# Patient Record
Sex: Female | Born: 1935
Health system: Southern US, Community
[De-identification: ages and names within clinical notes are randomized; demographics above are authoritative.]

## PROBLEM LIST (undated history)

## (undated) DIAGNOSIS — K222 Esophageal obstruction: Secondary | ICD-10-CM

## (undated) DIAGNOSIS — K649 Unspecified hemorrhoids: Secondary | ICD-10-CM

## (undated) DIAGNOSIS — R55 Syncope and collapse: Secondary | ICD-10-CM

## (undated) DIAGNOSIS — I10 Essential (primary) hypertension: Secondary | ICD-10-CM

## (undated) DIAGNOSIS — K449 Diaphragmatic hernia without obstruction or gangrene: Secondary | ICD-10-CM

## (undated) DIAGNOSIS — M25519 Pain in unspecified shoulder: Secondary | ICD-10-CM

## (undated) DIAGNOSIS — J309 Allergic rhinitis, unspecified: Secondary | ICD-10-CM

## (undated) DIAGNOSIS — R42 Dizziness and giddiness: Secondary | ICD-10-CM

## (undated) DIAGNOSIS — K8020 Calculus of gallbladder without cholecystitis without obstruction: Secondary | ICD-10-CM

## (undated) DIAGNOSIS — K219 Gastro-esophageal reflux disease without esophagitis: Secondary | ICD-10-CM

## (undated) DIAGNOSIS — G8929 Other chronic pain: Secondary | ICD-10-CM

## (undated) DIAGNOSIS — M1990 Unspecified osteoarthritis, unspecified site: Secondary | ICD-10-CM

## (undated) DIAGNOSIS — Z9289 Personal history of other medical treatment: Secondary | ICD-10-CM

## (undated) DIAGNOSIS — H9190 Unspecified hearing loss, unspecified ear: Secondary | ICD-10-CM

## (undated) HISTORY — DX: Calculus of gallbladder without cholecystitis without obstruction: K80.20

## (undated) HISTORY — DX: Syncope and collapse: R55

## (undated) HISTORY — DX: Gastro-esophageal reflux disease without esophagitis: K21.9

## (undated) HISTORY — DX: Diaphragmatic hernia without obstruction or gangrene: K44.9

## (undated) HISTORY — PX: KNEE ARTHROSCOPY: SUR90

## (undated) HISTORY — DX: Allergic rhinitis, unspecified: J30.9

## (undated) HISTORY — PX: HEMORROIDECTOMY: SUR656

## (undated) HISTORY — DX: Unspecified hemorrhoids: K64.9

## (undated) HISTORY — DX: Dizziness and giddiness: R42

## (undated) HISTORY — PX: ESOPHAGOGASTRODUODENOSCOPY (EGD) WITH ESOPHAGEAL DILATION: SHX5812

## (undated) HISTORY — DX: Unspecified osteoarthritis, unspecified site: M19.90

## (undated) HISTORY — PX: CARDIAC CATHETERIZATION: SHX172

## (undated) HISTORY — DX: Essential (primary) hypertension: I10

## (undated) HISTORY — DX: Esophageal obstruction: K22.2

## (undated) HISTORY — PX: DILATION AND CURETTAGE OF UTERUS: SHX78

---
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
## 1969-02-05 HISTORY — PX: VAGINAL HYSTERECTOMY: SUR661

## 1989-02-05 HISTORY — PX: CARPAL TUNNEL RELEASE: SHX101

## 1998-02-06 ENCOUNTER — Ambulatory Visit (HOSPITAL_COMMUNITY): Admission: RE | Admit: 1998-02-06 | Discharge: 1998-02-06 | Payer: Self-pay

## 1999-09-24 ENCOUNTER — Other Ambulatory Visit: Admission: RE | Admit: 1999-09-24 | Discharge: 1999-09-24 | Payer: Self-pay | Admitting: Obstetrics and Gynecology

## 1999-10-23 ENCOUNTER — Encounter: Payer: Self-pay | Admitting: *Deleted

## 1999-10-23 ENCOUNTER — Ambulatory Visit (HOSPITAL_COMMUNITY): Admission: RE | Admit: 1999-10-23 | Discharge: 1999-10-23 | Payer: Self-pay | Admitting: *Deleted

## 2002-05-16 ENCOUNTER — Emergency Department (HOSPITAL_COMMUNITY): Admission: EM | Admit: 2002-05-16 | Discharge: 2002-05-17 | Payer: Self-pay | Admitting: Emergency Medicine

## 2002-06-07 DIAGNOSIS — K219 Gastro-esophageal reflux disease without esophagitis: Secondary | ICD-10-CM

## 2002-06-07 DIAGNOSIS — K649 Unspecified hemorrhoids: Secondary | ICD-10-CM

## 2002-06-07 DIAGNOSIS — K449 Diaphragmatic hernia without obstruction or gangrene: Secondary | ICD-10-CM

## 2002-06-07 HISTORY — DX: Unspecified hemorrhoids: K64.9

## 2002-06-07 HISTORY — DX: Gastro-esophageal reflux disease without esophagitis: K21.9

## 2002-06-07 HISTORY — DX: Diaphragmatic hernia without obstruction or gangrene: K44.9

## 2002-08-22 ENCOUNTER — Encounter: Payer: Self-pay | Admitting: Gastroenterology

## 2002-08-22 LAB — HM COLONOSCOPY

## 2003-01-01 ENCOUNTER — Encounter: Admission: RE | Admit: 2003-01-01 | Discharge: 2003-01-01 | Payer: Self-pay

## 2004-04-14 ENCOUNTER — Emergency Department (HOSPITAL_COMMUNITY): Admission: EM | Admit: 2004-04-14 | Discharge: 2004-04-15 | Payer: Self-pay | Admitting: Emergency Medicine

## 2005-09-13 ENCOUNTER — Emergency Department (HOSPITAL_COMMUNITY): Admission: EM | Admit: 2005-09-13 | Discharge: 2005-09-13 | Payer: Self-pay | Admitting: Emergency Medicine

## 2005-12-27 LAB — HM MAMMOGRAPHY: HM Mammogram: NORMAL

## 2006-05-01 ENCOUNTER — Emergency Department (HOSPITAL_COMMUNITY): Admission: EM | Admit: 2006-05-01 | Discharge: 2006-05-01 | Payer: Self-pay | Admitting: Emergency Medicine

## 2006-07-06 ENCOUNTER — Ambulatory Visit: Payer: Self-pay | Admitting: Family Medicine

## 2006-07-06 LAB — CONVERTED CEMR LAB
AST: 41 units/L — ABNORMAL HIGH (ref 0–37)
Albumin: 3.9 g/dL (ref 3.5–5.2)
Basophils Absolute: 0.1 10*3/uL (ref 0.0–0.1)
Bilirubin, Direct: 0.1 mg/dL (ref 0.0–0.3)
Chloride: 104 meq/L (ref 96–112)
Eosinophils Absolute: 0.2 10*3/uL (ref 0.0–0.6)
Eosinophils Relative: 2.5 % (ref 0.0–5.0)
GFR calc non Af Amer: 57 mL/min
Glucose, Bld: 97 mg/dL (ref 70–99)
HCT: 38.3 % (ref 36.0–46.0)
Hemoglobin: 13.1 g/dL (ref 12.0–15.0)
Lymphocytes Relative: 36.5 % (ref 12.0–46.0)
MCHC: 34.1 g/dL (ref 30.0–36.0)
MCV: 90.1 fL (ref 78.0–100.0)
Monocytes Absolute: 0.5 10*3/uL (ref 0.2–0.7)
Neutrophils Relative %: 54.4 % (ref 43.0–77.0)
Potassium: 4.2 meq/L (ref 3.5–5.1)
Sodium: 140 meq/L (ref 135–145)
TSH: 1.37 microintl units/mL (ref 0.35–5.50)
Total Bilirubin: 0.6 mg/dL (ref 0.3–1.2)
WBC: 7.8 10*3/uL (ref 4.5–10.5)

## 2006-07-19 ENCOUNTER — Ambulatory Visit: Payer: Self-pay | Admitting: Family Medicine

## 2006-07-19 LAB — CONVERTED CEMR LAB
AST: 28 units/L (ref 0–37)
BUN: 13 mg/dL (ref 6–23)
CO2: 31 meq/L (ref 19–32)
Calcium: 10 mg/dL (ref 8.4–10.5)
Cholesterol: 194 mg/dL (ref 0–200)
Creatinine, Ser: 0.9 mg/dL (ref 0.4–1.2)
Glucose, Bld: 93 mg/dL (ref 70–99)
Total CHOL/HDL Ratio: 3.7
Triglycerides: 181 mg/dL — ABNORMAL HIGH (ref 0–149)

## 2006-08-01 ENCOUNTER — Ambulatory Visit: Payer: Self-pay | Admitting: Family Medicine

## 2006-08-01 LAB — CONVERTED CEMR LAB: Potassium: 5 meq/L (ref 3.5–5.1)

## 2006-08-16 ENCOUNTER — Ambulatory Visit: Payer: Self-pay | Admitting: Family Medicine

## 2006-08-16 LAB — CONVERTED CEMR LAB
Calcium: 9.4 mg/dL (ref 8.4–10.5)
Chloride: 104 meq/L (ref 96–112)
Creatinine, Ser: 0.9 mg/dL (ref 0.4–1.2)
GFR calc non Af Amer: 65 mL/min

## 2006-11-17 DIAGNOSIS — R609 Edema, unspecified: Secondary | ICD-10-CM

## 2006-11-17 DIAGNOSIS — I10 Essential (primary) hypertension: Secondary | ICD-10-CM | POA: Insufficient documentation

## 2006-11-22 ENCOUNTER — Ambulatory Visit: Payer: Self-pay | Admitting: Family Medicine

## 2006-11-22 LAB — CONVERTED CEMR LAB
Cholesterol: 229 mg/dL (ref 0–200)
Direct LDL: 141.7 mg/dL
GFR calc Af Amer: 69 mL/min
GFR calc non Af Amer: 57 mL/min
Glucose, Bld: 84 mg/dL (ref 70–99)
HDL: 49.5 mg/dL (ref >39.0)
Potassium: 5.3 meq/L — ABNORMAL HIGH (ref 3.5–5.1)
Sodium: 140 meq/L (ref 135–145)
Total CHOL/HDL Ratio: 4.6
Triglycerides: 168 mg/dL — ABNORMAL HIGH (ref 0–149)
VLDL: 34 mg/dL (ref 0–40)

## 2006-11-23 ENCOUNTER — Telehealth (INDEPENDENT_AMBULATORY_CARE_PROVIDER_SITE_OTHER): Payer: Self-pay | Admitting: *Deleted

## 2006-11-30 ENCOUNTER — Ambulatory Visit: Payer: Self-pay | Admitting: Family Medicine

## 2006-12-05 ENCOUNTER — Telehealth (INDEPENDENT_AMBULATORY_CARE_PROVIDER_SITE_OTHER): Payer: Self-pay | Admitting: *Deleted

## 2007-01-23 ENCOUNTER — Telehealth (INDEPENDENT_AMBULATORY_CARE_PROVIDER_SITE_OTHER): Payer: Self-pay | Admitting: *Deleted

## 2007-01-27 ENCOUNTER — Telehealth (INDEPENDENT_AMBULATORY_CARE_PROVIDER_SITE_OTHER): Payer: Self-pay | Admitting: *Deleted

## 2007-02-23 ENCOUNTER — Ambulatory Visit: Payer: Self-pay | Admitting: Family Medicine

## 2007-02-23 DIAGNOSIS — M549 Dorsalgia, unspecified: Secondary | ICD-10-CM | POA: Insufficient documentation

## 2007-02-23 DIAGNOSIS — R05 Cough: Secondary | ICD-10-CM | POA: Insufficient documentation

## 2007-02-23 DIAGNOSIS — R053 Chronic cough: Secondary | ICD-10-CM | POA: Insufficient documentation

## 2007-02-26 LAB — CONVERTED CEMR LAB
ALT: 23 units/L (ref 0–35)
AST: 32 units/L (ref 0–37)
BUN: 29 mg/dL — ABNORMAL HIGH (ref 6–23)
Calcium: 9.5 mg/dL (ref 8.4–10.5)
Chloride: 103 meq/L (ref 96–112)
GFR calc non Af Amer: 51 mL/min
HDL: 52.6 mg/dL (ref >39.0)
LDL Cholesterol: 83 mg/dL (ref 0–99)
Sodium: 142 meq/L (ref 135–145)
VLDL: 21 mg/dL (ref 0–40)

## 2007-02-27 ENCOUNTER — Telehealth (INDEPENDENT_AMBULATORY_CARE_PROVIDER_SITE_OTHER): Payer: Self-pay | Admitting: *Deleted

## 2007-03-02 ENCOUNTER — Ambulatory Visit: Payer: Self-pay | Admitting: Internal Medicine

## 2007-03-14 ENCOUNTER — Ambulatory Visit: Payer: Self-pay | Admitting: Internal Medicine

## 2007-03-16 ENCOUNTER — Telehealth (INDEPENDENT_AMBULATORY_CARE_PROVIDER_SITE_OTHER): Payer: Self-pay | Admitting: *Deleted

## 2007-03-22 ENCOUNTER — Ambulatory Visit: Payer: Self-pay | Admitting: Family Medicine

## 2007-03-22 DIAGNOSIS — M899 Disorder of bone, unspecified: Secondary | ICD-10-CM | POA: Insufficient documentation

## 2007-03-22 DIAGNOSIS — M949 Disorder of cartilage, unspecified: Secondary | ICD-10-CM

## 2007-05-17 ENCOUNTER — Ambulatory Visit: Payer: Self-pay | Admitting: Family Medicine

## 2007-05-17 LAB — CONVERTED CEMR LAB
Calcium: 9.4 mg/dL (ref 8.4–10.5)
GFR calc Af Amer: 62 mL/min
GFR calc non Af Amer: 51 mL/min
Glucose, Bld: 83 mg/dL (ref 70–99)
Sodium: 144 meq/L (ref 135–145)

## 2007-05-18 ENCOUNTER — Encounter (INDEPENDENT_AMBULATORY_CARE_PROVIDER_SITE_OTHER): Payer: Self-pay | Admitting: *Deleted

## 2007-05-18 ENCOUNTER — Telehealth (INDEPENDENT_AMBULATORY_CARE_PROVIDER_SITE_OTHER): Payer: Self-pay | Admitting: *Deleted

## 2007-06-08 DIAGNOSIS — K222 Esophageal obstruction: Secondary | ICD-10-CM

## 2007-06-08 HISTORY — DX: Esophageal obstruction: K22.2

## 2007-06-26 ENCOUNTER — Ambulatory Visit: Payer: Self-pay | Admitting: Family Medicine

## 2007-07-10 ENCOUNTER — Telehealth (INDEPENDENT_AMBULATORY_CARE_PROVIDER_SITE_OTHER): Payer: Self-pay | Admitting: *Deleted

## 2007-08-10 ENCOUNTER — Ambulatory Visit: Payer: Self-pay | Admitting: Family Medicine

## 2007-08-10 DIAGNOSIS — J309 Allergic rhinitis, unspecified: Secondary | ICD-10-CM | POA: Insufficient documentation

## 2007-08-17 ENCOUNTER — Ambulatory Visit: Payer: Self-pay | Admitting: Family Medicine

## 2007-08-24 ENCOUNTER — Ambulatory Visit: Payer: Self-pay | Admitting: Family Medicine

## 2007-09-12 ENCOUNTER — Ambulatory Visit: Payer: Self-pay | Admitting: Family Medicine

## 2007-09-20 ENCOUNTER — Telehealth (INDEPENDENT_AMBULATORY_CARE_PROVIDER_SITE_OTHER): Payer: Self-pay | Admitting: *Deleted

## 2007-12-28 ENCOUNTER — Telehealth (INDEPENDENT_AMBULATORY_CARE_PROVIDER_SITE_OTHER): Payer: Self-pay | Admitting: *Deleted

## 2008-01-01 ENCOUNTER — Telehealth: Payer: Self-pay | Admitting: Internal Medicine

## 2008-01-01 ENCOUNTER — Ambulatory Visit: Payer: Self-pay | Admitting: Internal Medicine

## 2008-01-01 LAB — CONVERTED CEMR LAB
Basophils Absolute: 0.1 10*3/uL (ref 0.0–0.1)
CO2: 30 meq/L (ref 19–32)
Chloride: 107 meq/L (ref 96–112)
Lymphocytes Relative: 29 % (ref 12.0–46.0)
MCHC: 33.5 g/dL (ref 30.0–36.0)
Neutrophils Relative %: 57.9 % (ref 43.0–77.0)
RBC: 3.98 M/uL (ref 3.87–5.11)
RDW: 14.1 % (ref 11.5–14.6)
Sodium: 145 meq/L (ref 135–145)

## 2008-01-02 ENCOUNTER — Telehealth (INDEPENDENT_AMBULATORY_CARE_PROVIDER_SITE_OTHER): Payer: Self-pay | Admitting: *Deleted

## 2008-01-08 ENCOUNTER — Ambulatory Visit: Payer: Self-pay | Admitting: Family Medicine

## 2008-01-16 ENCOUNTER — Telehealth (INDEPENDENT_AMBULATORY_CARE_PROVIDER_SITE_OTHER): Payer: Self-pay | Admitting: *Deleted

## 2008-01-23 ENCOUNTER — Telehealth (INDEPENDENT_AMBULATORY_CARE_PROVIDER_SITE_OTHER): Payer: Self-pay | Admitting: *Deleted

## 2008-01-24 ENCOUNTER — Telehealth (INDEPENDENT_AMBULATORY_CARE_PROVIDER_SITE_OTHER): Payer: Self-pay | Admitting: *Deleted

## 2008-01-29 ENCOUNTER — Ambulatory Visit (HOSPITAL_BASED_OUTPATIENT_CLINIC_OR_DEPARTMENT_OTHER): Admission: RE | Admit: 2008-01-29 | Discharge: 2008-01-29 | Payer: Self-pay | Admitting: Specialist

## 2008-02-27 ENCOUNTER — Telehealth (INDEPENDENT_AMBULATORY_CARE_PROVIDER_SITE_OTHER): Payer: Self-pay | Admitting: *Deleted

## 2008-03-01 ENCOUNTER — Telehealth (INDEPENDENT_AMBULATORY_CARE_PROVIDER_SITE_OTHER): Payer: Self-pay | Admitting: *Deleted

## 2008-03-21 ENCOUNTER — Ambulatory Visit: Payer: Self-pay | Admitting: Family Medicine

## 2008-03-21 DIAGNOSIS — K219 Gastro-esophageal reflux disease without esophagitis: Secondary | ICD-10-CM

## 2008-04-15 ENCOUNTER — Telehealth (INDEPENDENT_AMBULATORY_CARE_PROVIDER_SITE_OTHER): Payer: Self-pay | Admitting: *Deleted

## 2008-04-22 DIAGNOSIS — K449 Diaphragmatic hernia without obstruction or gangrene: Secondary | ICD-10-CM | POA: Insufficient documentation

## 2008-04-22 DIAGNOSIS — K648 Other hemorrhoids: Secondary | ICD-10-CM | POA: Insufficient documentation

## 2008-04-23 ENCOUNTER — Ambulatory Visit: Payer: Self-pay | Admitting: Gastroenterology

## 2008-04-24 ENCOUNTER — Ambulatory Visit: Payer: Self-pay | Admitting: Gastroenterology

## 2008-05-24 ENCOUNTER — Encounter: Payer: Self-pay | Admitting: Family Medicine

## 2008-05-28 ENCOUNTER — Ambulatory Visit: Payer: Self-pay | Admitting: Gastroenterology

## 2008-05-30 ENCOUNTER — Telehealth (INDEPENDENT_AMBULATORY_CARE_PROVIDER_SITE_OTHER): Payer: Self-pay | Admitting: *Deleted

## 2008-06-04 ENCOUNTER — Ambulatory Visit: Payer: Self-pay | Admitting: Family Medicine

## 2008-06-04 DIAGNOSIS — R300 Dysuria: Secondary | ICD-10-CM

## 2008-06-04 LAB — CONVERTED CEMR LAB
Nitrite: NEGATIVE
Specific Gravity, Urine: 1.025
Urobilinogen, UA: 0.2
WBC Urine, dipstick: NEGATIVE
pH: 5

## 2008-06-06 ENCOUNTER — Encounter: Payer: Self-pay | Admitting: Family Medicine

## 2008-06-10 ENCOUNTER — Telehealth: Payer: Self-pay | Admitting: Gastroenterology

## 2008-06-10 ENCOUNTER — Encounter (INDEPENDENT_AMBULATORY_CARE_PROVIDER_SITE_OTHER): Payer: Self-pay | Admitting: *Deleted

## 2008-06-21 ENCOUNTER — Telehealth (INDEPENDENT_AMBULATORY_CARE_PROVIDER_SITE_OTHER): Payer: Self-pay | Admitting: *Deleted

## 2008-08-27 ENCOUNTER — Telehealth: Payer: Self-pay | Admitting: Family Medicine

## 2008-09-05 ENCOUNTER — Ambulatory Visit: Payer: Self-pay | Admitting: Family Medicine

## 2008-09-14 LAB — CONVERTED CEMR LAB
ALT: 20 units/L (ref 0–35)
AST: 25 units/L (ref 0–37)
Albumin: 4.3 g/dL (ref 3.5–5.2)
BUN: 31 mg/dL — ABNORMAL HIGH (ref 6–23)
Basophils Absolute: 0.1 10*3/uL (ref 0.0–0.1)
Chloride: 103 meq/L (ref 96–112)
Cholesterol: 194 mg/dL (ref 0–200)
Eosinophils Relative: 2.8 % (ref 0.0–5.0)
GFR calc non Af Amer: 35.7 mL/min (ref >60)
Glucose, Bld: 98 mg/dL (ref 70–99)
HCT: 36.5 % (ref 36.0–46.0)
Hemoglobin: 12.3 g/dL (ref 12.0–15.0)
Lymphs Abs: 2.7 10*3/uL (ref 0.7–4.0)
MCV: 84.2 fL (ref 78.0–100.0)
Monocytes Absolute: 0.7 10*3/uL (ref 0.1–1.0)
Monocytes Relative: 7.9 % (ref 3.0–12.0)
Neutro Abs: 4.7 10*3/uL (ref 1.4–7.7)
Platelets: 279 10*3/uL (ref 150.0–400.0)
Potassium: 5 meq/L (ref 3.5–5.1)
RDW: 15.5 % — ABNORMAL HIGH (ref 11.5–14.6)
Sodium: 140 meq/L (ref 135–145)
Total Bilirubin: 0.9 mg/dL (ref 0.3–1.2)
Vit D, 25-Hydroxy: 40 ng/mL (ref 30–89)

## 2008-09-16 ENCOUNTER — Encounter (INDEPENDENT_AMBULATORY_CARE_PROVIDER_SITE_OTHER): Payer: Self-pay | Admitting: *Deleted

## 2008-09-26 ENCOUNTER — Telehealth (INDEPENDENT_AMBULATORY_CARE_PROVIDER_SITE_OTHER): Payer: Self-pay | Admitting: *Deleted

## 2009-02-03 ENCOUNTER — Ambulatory Visit: Payer: Self-pay | Admitting: Family Medicine

## 2009-02-03 DIAGNOSIS — R0602 Shortness of breath: Secondary | ICD-10-CM

## 2009-02-04 ENCOUNTER — Encounter (INDEPENDENT_AMBULATORY_CARE_PROVIDER_SITE_OTHER): Payer: Self-pay | Admitting: *Deleted

## 2009-02-13 ENCOUNTER — Telehealth (INDEPENDENT_AMBULATORY_CARE_PROVIDER_SITE_OTHER): Payer: Self-pay | Admitting: *Deleted

## 2009-02-13 LAB — CONVERTED CEMR LAB
Basophils Relative: 0.4 % (ref 0.0–3.0)
Calcium: 9.3 mg/dL (ref 8.4–10.5)
Creatinine, Ser: 1.3 mg/dL — ABNORMAL HIGH (ref 0.4–1.2)
Eosinophils Absolute: 0.3 10*3/uL (ref 0.0–0.7)
Eosinophils Relative: 3.5 % (ref 0.0–5.0)
GFR calc non Af Amer: 42.06 mL/min (ref >60)
Hemoglobin: 11.2 g/dL — ABNORMAL LOW (ref 12.0–15.0)
Lymphocytes Relative: 34.9 % (ref 12.0–46.0)
Monocytes Relative: 8.2 % (ref 3.0–12.0)
Neutro Abs: 4.3 10*3/uL (ref 1.4–7.7)
Neutrophils Relative %: 53 % (ref 43.0–77.0)
RBC: 3.9 M/uL (ref 3.87–5.11)
Sodium: 139 meq/L (ref 135–145)
WBC: 8.1 10*3/uL (ref 4.5–10.5)

## 2009-03-19 ENCOUNTER — Ambulatory Visit: Payer: Self-pay | Admitting: Family Medicine

## 2009-03-20 LAB — CONVERTED CEMR LAB
Basophils Relative: 0.5 % (ref 0.0–3.0)
Eosinophils Relative: 5.4 % — ABNORMAL HIGH (ref 0.0–5.0)
Ferritin: 6.9 ng/mL — ABNORMAL LOW (ref 10.0–291.0)
Folate: 9.8 ng/mL
HCT: 35.1 % — ABNORMAL LOW (ref 36.0–46.0)
Hemoglobin: 11.6 g/dL — ABNORMAL LOW (ref 12.0–15.0)
MCV: 88.5 fL (ref 78.0–100.0)
Monocytes Absolute: 0.7 10*3/uL (ref 0.1–1.0)
Neutro Abs: 4.2 10*3/uL (ref 1.4–7.7)
Neutrophils Relative %: 54.5 % (ref 43.0–77.0)
RBC: 3.96 M/uL (ref 3.87–5.11)
Transferrin: 323.4 mg/dL (ref 212.0–360.0)
WBC: 7.6 10*3/uL (ref 4.5–10.5)

## 2009-03-21 ENCOUNTER — Encounter (INDEPENDENT_AMBULATORY_CARE_PROVIDER_SITE_OTHER): Payer: Self-pay | Admitting: *Deleted

## 2009-04-01 ENCOUNTER — Ambulatory Visit: Payer: Self-pay | Admitting: Family Medicine

## 2009-04-01 ENCOUNTER — Telehealth (INDEPENDENT_AMBULATORY_CARE_PROVIDER_SITE_OTHER): Payer: Self-pay | Admitting: *Deleted

## 2009-04-01 DIAGNOSIS — E538 Deficiency of other specified B group vitamins: Secondary | ICD-10-CM

## 2009-04-01 DIAGNOSIS — D509 Iron deficiency anemia, unspecified: Secondary | ICD-10-CM | POA: Insufficient documentation

## 2009-04-01 DIAGNOSIS — D519 Vitamin B12 deficiency anemia, unspecified: Secondary | ICD-10-CM | POA: Insufficient documentation

## 2009-04-08 ENCOUNTER — Telehealth (INDEPENDENT_AMBULATORY_CARE_PROVIDER_SITE_OTHER): Payer: Self-pay | Admitting: *Deleted

## 2009-06-09 ENCOUNTER — Telehealth: Payer: Self-pay | Admitting: Family Medicine

## 2009-06-16 ENCOUNTER — Ambulatory Visit: Payer: Self-pay | Admitting: Family Medicine

## 2009-06-16 DIAGNOSIS — E559 Vitamin D deficiency, unspecified: Secondary | ICD-10-CM | POA: Insufficient documentation

## 2009-06-23 ENCOUNTER — Ambulatory Visit: Payer: Self-pay | Admitting: Family Medicine

## 2009-06-30 LAB — CONVERTED CEMR LAB
ALT: 18 units/L (ref 0–35)
BUN: 21 mg/dL (ref 6–23)
Basophils Relative: 1.1 % (ref 0.0–3.0)
Chloride: 106 meq/L (ref 96–112)
Direct LDL: 137.9 mg/dL
Eosinophils Relative: 4.2 % (ref 0.0–5.0)
Folate: 13.8 ng/mL
HCT: 39.3 % (ref 36.0–46.0)
Lymphs Abs: 2.3 10*3/uL (ref 0.7–4.0)
MCV: 94.9 fL (ref 78.0–100.0)
Monocytes Absolute: 0.4 10*3/uL (ref 0.1–1.0)
Potassium: 5.3 meq/L — ABNORMAL HIGH (ref 3.5–5.1)
RBC: 4.14 M/uL (ref 3.87–5.11)
Total CHOL/HDL Ratio: 4
Total Protein: 6.6 g/dL (ref 6.0–8.3)
VLDL: 31.6 mg/dL (ref 0.0–40.0)
Vit D, 25-Hydroxy: 43 ng/mL (ref 30–89)
WBC: 5.9 10*3/uL (ref 4.5–10.5)

## 2009-07-28 ENCOUNTER — Ambulatory Visit: Payer: Self-pay | Admitting: Family Medicine

## 2009-07-28 DIAGNOSIS — R42 Dizziness and giddiness: Secondary | ICD-10-CM | POA: Insufficient documentation

## 2009-07-28 DIAGNOSIS — M542 Cervicalgia: Secondary | ICD-10-CM | POA: Insufficient documentation

## 2009-07-28 DIAGNOSIS — H6120 Impacted cerumen, unspecified ear: Secondary | ICD-10-CM | POA: Insufficient documentation

## 2009-07-28 LAB — CONVERTED CEMR LAB
Basophils Absolute: 0 10*3/uL (ref 0.0–0.1)
Calcium: 9.7 mg/dL (ref 8.4–10.5)
GFR calc non Af Amer: 42.01 mL/min (ref >60)
Glucose, Bld: 99 mg/dL (ref 70–99)
Hemoglobin: 13.3 g/dL (ref 12.0–15.0)
Lymphocytes Relative: 38.3 % (ref 12.0–46.0)
Monocytes Relative: 7.5 % (ref 3.0–12.0)
Neutro Abs: 3.4 10*3/uL (ref 1.4–7.7)
RBC: 4.12 M/uL (ref 3.87–5.11)
RDW: 14.6 % (ref 11.5–14.6)
Sodium: 144 meq/L (ref 135–145)

## 2009-10-29 ENCOUNTER — Ambulatory Visit: Payer: Self-pay | Admitting: Family Medicine

## 2009-10-29 DIAGNOSIS — B356 Tinea cruris: Secondary | ICD-10-CM | POA: Insufficient documentation

## 2009-12-14 ENCOUNTER — Encounter: Payer: Self-pay | Admitting: Family Medicine

## 2009-12-15 ENCOUNTER — Encounter: Payer: Self-pay | Admitting: Family Medicine

## 2009-12-23 ENCOUNTER — Ambulatory Visit: Payer: Self-pay | Admitting: Family Medicine

## 2009-12-23 DIAGNOSIS — E860 Dehydration: Secondary | ICD-10-CM

## 2009-12-25 LAB — CONVERTED CEMR LAB
BUN: 13 mg/dL (ref 6–23)
Chloride: 102 meq/L (ref 96–112)
Potassium: 4.7 meq/L (ref 3.5–5.1)

## 2009-12-30 ENCOUNTER — Telehealth: Payer: Self-pay | Admitting: Family Medicine

## 2010-01-01 ENCOUNTER — Ambulatory Visit: Payer: Self-pay | Admitting: Family Medicine

## 2010-01-14 ENCOUNTER — Encounter: Payer: Self-pay | Admitting: Family Medicine

## 2010-01-20 ENCOUNTER — Telehealth: Payer: Self-pay | Admitting: Family Medicine

## 2010-01-23 ENCOUNTER — Telehealth: Payer: Self-pay | Admitting: Family Medicine

## 2010-02-18 ENCOUNTER — Ambulatory Visit: Payer: Self-pay | Admitting: Family Medicine

## 2010-03-19 ENCOUNTER — Encounter: Payer: Self-pay | Admitting: Family Medicine

## 2010-04-06 ENCOUNTER — Encounter: Payer: Self-pay | Admitting: Family Medicine

## 2010-04-13 ENCOUNTER — Ambulatory Visit: Payer: Self-pay | Admitting: Family Medicine

## 2010-04-13 DIAGNOSIS — J069 Acute upper respiratory infection, unspecified: Secondary | ICD-10-CM | POA: Insufficient documentation

## 2010-04-21 ENCOUNTER — Telehealth: Payer: Self-pay | Admitting: Family Medicine

## 2010-04-21 LAB — CONVERTED CEMR LAB
Albumin: 3.9 g/dL (ref 3.5–5.2)
Alkaline Phosphatase: 75 units/L (ref 39–117)
BUN: 21 mg/dL (ref 6–23)
Basophils Absolute: 0 10*3/uL (ref 0.0–0.1)
Bilirubin, Direct: 0.1 mg/dL (ref 0.0–0.3)
CO2: 29 meq/L (ref 19–32)
Calcium: 9.6 mg/dL (ref 8.4–10.5)
Cholesterol: 183 mg/dL (ref 0–200)
Creatinine, Ser: 1.2 mg/dL (ref 0.4–1.2)
Eosinophils Absolute: 0.4 10*3/uL (ref 0.0–0.7)
Folate: 8.4 ng/mL
Glucose, Bld: 86 mg/dL (ref 70–99)
HDL: 51.3 mg/dL (ref >39.00)
Lymphocytes Relative: 29.5 % (ref 12.0–46.0)
MCHC: 34.5 g/dL (ref 30.0–36.0)
Monocytes Absolute: 0.5 10*3/uL (ref 0.1–1.0)
Neutrophils Relative %: 59.3 % (ref 43.0–77.0)
Platelets: 266 10*3/uL (ref 150.0–400.0)
RDW: 13.7 % (ref 11.5–14.6)
Total CHOL/HDL Ratio: 4
Triglycerides: 120 mg/dL (ref 0.0–149.0)
Vitamin B-12: 228 pg/mL (ref 211–911)

## 2010-04-24 ENCOUNTER — Telehealth (INDEPENDENT_AMBULATORY_CARE_PROVIDER_SITE_OTHER): Payer: Self-pay | Admitting: *Deleted

## 2010-05-25 ENCOUNTER — Telehealth (INDEPENDENT_AMBULATORY_CARE_PROVIDER_SITE_OTHER): Payer: Self-pay | Admitting: *Deleted

## 2010-06-17 ENCOUNTER — Other Ambulatory Visit: Payer: Self-pay | Admitting: Family Medicine

## 2010-06-17 ENCOUNTER — Ambulatory Visit
Admission: RE | Admit: 2010-06-17 | Discharge: 2010-06-17 | Payer: Self-pay | Source: Home / Self Care | Attending: Family Medicine | Admitting: Family Medicine

## 2010-06-17 LAB — VITAMIN B12: Vitamin B-12: 319 pg/mL (ref 211–911)

## 2010-06-18 ENCOUNTER — Ambulatory Visit (HOSPITAL_BASED_OUTPATIENT_CLINIC_OR_DEPARTMENT_OTHER)
Admission: RE | Admit: 2010-06-18 | Discharge: 2010-06-18 | Payer: Self-pay | Source: Home / Self Care | Attending: Family Medicine | Admitting: Family Medicine

## 2010-06-18 ENCOUNTER — Ambulatory Visit
Admission: RE | Admit: 2010-06-18 | Discharge: 2010-06-18 | Payer: Self-pay | Source: Home / Self Care | Attending: Family Medicine | Admitting: Family Medicine

## 2010-06-18 ENCOUNTER — Other Ambulatory Visit: Payer: Self-pay | Admitting: Family Medicine

## 2010-06-18 DIAGNOSIS — R1011 Right upper quadrant pain: Secondary | ICD-10-CM | POA: Insufficient documentation

## 2010-06-18 LAB — CBC WITH DIFFERENTIAL/PLATELET
Basophils Absolute: 0 10*3/uL (ref 0.0–0.1)
Basophils Relative: 0.4 % (ref 0.0–3.0)
Eosinophils Absolute: 0.3 10*3/uL (ref 0.0–0.7)
Eosinophils Relative: 2.7 % (ref 0.0–5.0)
HCT: 38.4 % (ref 36.0–46.0)
Hemoglobin: 13.1 g/dL (ref 12.0–15.0)
Lymphocytes Relative: 34.7 % (ref 12.0–46.0)
Lymphs Abs: 3.4 10*3/uL (ref 0.7–4.0)
MCHC: 34.2 g/dL (ref 30.0–36.0)
MCV: 95.8 fl (ref 78.0–100.0)
Monocytes Absolute: 0.5 10*3/uL (ref 0.1–1.0)
Monocytes Relative: 5.6 % (ref 3.0–12.0)
Neutro Abs: 5.5 10*3/uL (ref 1.4–7.7)
Neutrophils Relative %: 56.6 % (ref 43.0–77.0)
Platelets: 272 10*3/uL (ref 150.0–400.0)
RBC: 4.01 Mil/uL (ref 3.87–5.11)
RDW: 13.8 % (ref 11.5–14.6)
WBC: 9.7 10*3/uL (ref 4.5–10.5)

## 2010-06-18 LAB — HEPATIC FUNCTION PANEL
ALT: 17 U/L (ref 0–35)
AST: 21 U/L (ref 0–37)
Albumin: 3.8 g/dL (ref 3.5–5.2)
Alkaline Phosphatase: 78 U/L (ref 39–117)
Bilirubin, Direct: 0.1 mg/dL (ref 0.0–0.3)
Total Bilirubin: 0.9 mg/dL (ref 0.3–1.2)
Total Protein: 6.4 g/dL (ref 6.0–8.3)

## 2010-06-18 LAB — BASIC METABOLIC PANEL
BUN: 21 mg/dL (ref 6–23)
CO2: 27 mEq/L (ref 19–32)
Calcium: 8.7 mg/dL (ref 8.4–10.5)
Chloride: 105 mEq/L (ref 96–112)
Creatinine, Ser: 1 mg/dL (ref 0.4–1.2)
GFR: 56.74 mL/min — ABNORMAL LOW (ref >60.00)
Glucose, Bld: 79 mg/dL (ref 70–99)
Potassium: 4.1 mEq/L (ref 3.5–5.1)
Sodium: 140 mEq/L (ref 135–145)

## 2010-06-18 LAB — AMYLASE: Amylase: 69 U/L (ref 27–131)

## 2010-06-18 LAB — LIPASE: Lipase: 27 U/L (ref 11.0–59.0)

## 2010-06-30 ENCOUNTER — Ambulatory Visit (HOSPITAL_COMMUNITY)
Admission: RE | Admit: 2010-06-30 | Discharge: 2010-06-30 | Payer: Self-pay | Source: Home / Self Care | Attending: Family Medicine | Admitting: Family Medicine

## 2010-07-07 NOTE — Progress Notes (Signed)
Summary: bp readings  Phone Note From Other Clinic   Caller: [facility] Details for Reason: Rcv'd faxed Log of BP readings for the last two weeks Summary of Call: BP reading's sent from [place]  12/30/09- 178/90              01/03/10- 122/70            01/07/10- 126/76         01/13/10-  138/64 12/31/09- 160/88              01/04/10- 138/62            01/08/10- 130/80         01/14/10-122/64 01/01/10- 158/84              01/05/10- 140/80            01/09/10- 138/78         01/02/10- 162/88              01/06/10- 156/80            01/12/10- 122/78  No readings recorded for 8/6 and 01/11/10 Initial call taken by: Aron Baba CMA Deborra Medina),  January 20, 2010 11:28 AM  Follow-up for Phone Call        great! last several days have been good.  Con't to monitor---- she should have f/u in about 3 months. Follow-up by: Garnet Koyanagi DO,  January 20, 2010 1:21 PM  Additional Follow-up for Phone Call Additional follow up Details #1::        pt aware................Marland KitchenFelecia Deloach CMA  January 21, 2010 1:22 PM

## 2010-07-07 NOTE — Progress Notes (Signed)
Summary: Bp Visit  Phone Note Call from Patient   Caller: Patient Summary of Call: Pt states that she was told by Dr. Etter Sjogren that if her blood pressure went back up for her not to wait for her to come back in and see Dr. Etter Sjogren.There is no opening today and pt was offered an appt tomorrow morning but refused due to a dentist appt. Please advise.[phone] Initial call taken by: Osborn Coho,  December 30, 2009 12:21 PM  Follow-up for Phone Call        pt needs appointment Follow-up by: Garnet Koyanagi DO,  December 30, 2009 1:57 PM  Additional Follow-up for Phone Call Additional follow up Details #1::        pt aware appt schedule..............Marland KitchenFelecia Deloach CMA  December 30, 2009 3:58 PM

## 2010-07-07 NOTE — Progress Notes (Signed)
Summary: lab and rx concerns  Phone Note From Other Clinic   Caller: melissa harday ( [facility]) Summary of Call: Nurse left VM  that pt had some questions about lab results and med that was rx and sent in. left message to call office ..........Marland KitchenFelecia Deloach CMA  April 24, 2010 4:55 PM   Follow-up for Phone Call        Spoke with nurse,  pt was confused why she received rx when her Vitamin-D results states that they were normal. Advised nurse rx was for B12 and informed nurse how injection were to be given. Nurse verbalized understanding and will inform pt..............Marland KitchenFelecia Deloach CMA  April 27, 2010 9:26 AM

## 2010-07-07 NOTE — Progress Notes (Signed)
Summary: labs-lmom  Phone Note Outgoing Call   Call placed by: Malachi Bonds CMA,  April 21, 2010 4:50 PM Call placed to: Patient Summary of Call: b12 injections weekly x4 then monthly----recheck b12 in 1 month rest of labs are good  Follow-up for Phone Call        left message w/ husband to have patient return call.......Marland KitchenMalachi Bonds CMA  April 21, 2010 4:50 PM   Additional Follow-up for Phone Call Additional follow up Details #1::        Call from pt I advised her of her results, and she  wants to know if we can call in an RX for B-12 to the  [place] on [address] and she will have a nurse at the living facility give her the injections so she won't have to come into the ofice.   c/b # is [id number]...Marland KitchenMarland Kitchen Please adise Additional Follow-up by: Aron Baba CMA Deborra Medina),  April 22, 2010 8:10 AM    Additional Follow-up for Phone Call Additional follow up Details #2::    that is fine b12 1 ml im weekly x4  then monthly---recheck b12 in 1 month Follow-up by: Garnet Koyanagi DO,  April 22, 2010 9:44 AM  New/Updated Medications: CYANOCOBALAMIN 1000 MCG/ML SOLN (CYANOCOBALAMIN) inject 1ML IM once weekly then monthly Prescriptions: CYANOCOBALAMIN 1000 MCG/ML SOLN (CYANOCOBALAMIN) inject 1ML IM once weekly then monthly  #75ml x 0   Entered by:   Aron Baba CMA ([place])   Authorized by:   Garnet Koyanagi DO   Signed by:   Aron Baba CMA ([place]) on 04/22/2010   Method used:   Electronically to        [facility] (retail)       [phone]       [place], [place]  [address]       Ph: [phone]       Fax: [phone]   RxID:   [id number]

## 2010-07-07 NOTE — Progress Notes (Signed)
Summary: refill   Phone Note Refill Request   Refills Requested: Medication #1:  TUSSIONEX PENNKINETIC ER 8-10 MG/5ML  LQCR 1 tsp by mouth at bedtime as needed   Last Refilled: 11/29/2008 last ov- 02/03/2009 [facility] w. Erling Conte   Initial call taken by: Allyn Kenner CMA,  June 09, 2009 12:13 PM  Follow-up for Phone Call        we don't usually refill tussonex=== esp since last ov August. Follow-up by: Garnet Koyanagi DO,  June 09, 2009 1:04 PM  Additional Follow-up for Phone Call Additional follow up Details #1::        pt aware - she will call and make an appt.  Additional Follow-up by: Allyn Kenner CMA,  June 09, 2009 1:18 PM

## 2010-07-07 NOTE — Letter (Signed)
Summary: [facility]   Imported By: Edmonia James 04/14/2010 13:56:30  _____________________________________________________________________  External Attachment:    Type:   Image     Comment:   External Document

## 2010-07-07 NOTE — Assessment & Plan Note (Signed)
Summary: FU/KDC/B12 INJ/LABS FOR B12 LEVEL CHECK/KDC   Vital Signs:  Patient profile:   77 year old female Menstrual status:  postmenopausal Height:      68 inches Weight:      186 pounds BMI:     28.38 Temp:     98.0 degrees F oral Pulse rate:   70 / minute Pulse rhythm:   regular BP sitting:   132 / 76  (left arm) Cuff size:   regular  Vitals Entered By: Allyn Kenner CMA (June 16, 2009 8:05 AM) CC: follow up, refills, check vitamin d level.    History of Present Illness:  Hypertension follow-up      This is a 77 year old woman who presents for Hypertension follow-up.  The patient denies lightheadedness, urinary frequency, headaches, edema, impotence, rash, and fatigue.  The patient denies the following associated symptoms: chest pain, chest pressure, exercise intolerance, dyspnea, palpitations, syncope, leg edema, and pedal edema.  Compliance with medications (by patient report) has been near 100%.  The patient reports that dietary compliance has been good.  The patient reports exercising occasionally.  Adjunctive measures currently used by the patient include salt restriction.    Current Medications (verified): 1)  Furosemide 40 Mg  Tabs (Furosemide) .... Take 1 Tablet By Mouth Once A Day As Needed 2)  Benicar 40 Mg  Tabs (Olmesartan Medoxomil) .... Take 1 Tablet By Mouth Once A Day 3)  Calcium 600 600 Mg  Tabs (Calcium Carbonate) .... Take 1 Tablet By Mouth Once A Day 4)  Fish Oil 1000 Mg  Caps (Omega-3 Fatty Acids) .... Take 1 Tablet By Mouth Once A Day 5)  Multivitamins   Tabs (Multiple Vitamin) .... Take 1 Tablet By Mouth Once A Day 6)  Tussionex Pennkinetic Er 8-10 Mg/25ml  Lqcr (Chlorpheniramine-Hydrocodone) .[name].. 1 Tsp By Mouth At Bedtime As Needed 7)  Cyanocobalamin 1000 Mcg/ml Soln (Cyanocobalamin) .... Injections 67ml Weekly For 1 Month Then Monthly 8)  Vitamin D3 2000 Unit Caps (Cholecalciferol) .... Daily.  Allergies: 1)  Codeine  Past History:  Past medical,  surgical, family and social histories (including risk factors) reviewed for relevance to current acute and chronic problems.  Past Medical History: Reviewed history from 01/01/2008 and no changes required. Hypertension Allergic rhinitis   Past Surgical History: Reviewed history from 01/01/2008 and no changes required. Hemorrhoidectomy Hysterectomy Knee sx Right Carpal tunnel Cardiac catherization  Family History: Reviewed history from 05/28/2008 and no changes required. CAD - no Stoke - father had light stroke at age 82 DM - no Breast Ca - no Colon Ca - no Prostate Ca - no No FH of Colon Cancer: Family History of Heart Disease: Sister  Social History: Reviewed history from 05/28/2008 and no changes required. 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   Review of Systems      See HPI  Physical Exam  General:  Well-developed,well-nourished,in no acute distress; alert,appropriate and cooperative throughout examination Neck:  No deformities, masses, or tenderness noted.no carotid bruits and no cervical lymphadenopathy.   Lungs:  Normal respiratory effort, chest expands symmetrically. Lungs are clear to auscultation, no crackles or wheezes. Heart:  normal rate and no murmur.   Extremities:  No clubbing, cyanosis, edema, or deformity noted with normal full range of motion of all joints.   Skin:  Intact without suspicious lesions or rashes Cervical Nodes:  No lymphadenopathy noted Psych:  Oriented  X3 and normally interactive.     Impression & Recommendations:  Problem # 1:  B12 DEFICIENCY (ICD-266.2)  Orders: Venipuncture [id number]) TLB-B12 + Folate Pnl [id number]) TLB-Lipid Panel (80061-LIPID) TLB-BMP (Basic Metabolic Panel-BMET) (99991111) TLB-CBC Platelet - w/Differential (85025-CBCD) TLB-Hepatic/Liver Function Pnl (80076-HEPATIC) T-Vitamin D (25-Hydroxy)  [id number])  Problem # 2:  UNSPECIFIED VITAMIN D DEFICIENCY (ICD-268.9)  Orders: Venipuncture [id number]) TLB-B12 + Folate Pnl [id number]) TLB-Lipid Panel (80061-LIPID) TLB-BMP (Basic Metabolic Panel-BMET) (99991111) TLB-CBC Platelet - w/Differential (85025-CBCD) TLB-Hepatic/Liver Function Pnl (80076-HEPATIC) T-Vitamin D (25-Hydroxy) [id number])  Problem # 3:  HYPERTENSION (ICD-401.9)  Her updated medication list for this problem includes:    Furosemide 40 Mg Tabs (Furosemide) .[name]... Take 1 tablet by mouth once a day as needed    Benicar 40 Mg Tabs (Olmesartan medoxomil) .[name]... Take 1 tablet by mouth once a day  Orders: Venipuncture [id number]) TLB-B12 + Folate Pnl [id number]) TLB-Lipid Panel (80061-LIPID) TLB-BMP (Basic Metabolic Panel-BMET) (99991111) TLB-CBC Platelet - w/Differential (85025-CBCD) TLB-Hepatic/Liver Function Pnl (80076-HEPATIC) T-Vitamin D (25-Hydroxy) [id number])  BP today: 132/76 Prior BP: 130/72 (02/03/2009)  Prior 10 Yr Risk Heart Disease: 7 % (08/10/2007)  Labs Reviewed: K+: 4.8 (02/03/2009) Creat: : 1.3 (02/03/2009)   Chol: 194 (09/05/2008)   HDL: 44.30 (09/05/2008)   LDL: 124 (09/05/2008)   TG: 127.0 (09/05/2008)  Complete Medication List: 1)  Furosemide 40 Mg Tabs (Furosemide) .... Take 1 tablet by mouth once a day as needed 2)  Benicar 40 Mg Tabs (Olmesartan medoxomil) .... Take 1 tablet by mouth once a day 3)  Calcium 600 600 Mg Tabs (Calcium carbonate) .... Take 1 tablet by mouth once a day 4)  Fish Oil 1000 Mg Caps (Omega-3 fatty acids) .... Take 1 tablet by mouth once a day 5)  Multivitamins Tabs (Multiple vitamin) .... Take 1 tablet by mouth once a day 6)  Tussionex Pennkinetic Er 8-10 Mg/24ml Lqcr (Chlorpheniramine-hydrocodone) .[name].. 1 tsp by mouth at bedtime as needed 7)  Cyanocobalamin 1000 Mcg/ml Soln (Cyanocobalamin) .... Injections 43ml weekly for 1 month then monthly 8)  Vitamin D3 2000 Unit Caps  (Cholecalciferol) .... Daily. Prescriptions: FUROSEMIDE 40 MG  TABS (FUROSEMIDE) Take 1 tablet by mouth once a day as needed  #90 x 3   Entered and Authorized by:   Garnet Koyanagi DO   Signed by:   Garnet Koyanagi DO on 06/16/2009   Method used:   Faxed to ...       [facility] [place]  [address]       Ph: [phone]       Fax: [phone]   RxID:   [id number] BENICAR 40 MG  TABS (OLMESARTAN MEDOXOMIL) Take 1 tablet by mouth once a day  #90 x 3   Entered and Authorized by:   Garnet Koyanagi DO   Signed by:   Garnet Koyanagi DO on 06/16/2009   Method used:   Faxed to ...       [facility])       [place]  [address]       Ph: [phone]       Fax: [phone]   RxID:   [id number] [place] ER 8-10 MG/5ML  LQCR (CHLORPHENIRAMINE-HYDROCODONE) 1 tsp by mouth at bedtime as needed  #6 oz x 0   Entered and Authorized by:   Garnet Koyanagi DO   Signed by:   Garnet Koyanagi DO  on 06/16/2009   Method used:   Print then Give to Patient   RxID:   [id number]  f

## 2010-07-07 NOTE — Progress Notes (Signed)
Summary: refill  Phone Note Refill Request   Refills Requested: Medication #1:  TUSSIONEX PENNKINETIC ER 8-10 MG/5ML  LQCR 1 tsp by mouth at bedtime as needed last filled 6 oz, last ov 01-01-10..............Marland KitchenFelecia Deloach CMA  January 23, 2010 2:38 PM   Caller: Patient Summary of Call: patient said she has been taking tussinex  for 10 years because of a cough she gets at night - refused appt just wants refill  Initial call taken by: Arbie [place],  January 23, 2010 9:35 AM  Follow-up for Phone Call        Please advise.  Follow-up by: Ernestene Mention CMA,  January 23, 2010 3:23 PM  Additional Follow-up for Phone Call Additional follow up Details #1::        refill x1 Additional Follow-up by: Garnet Koyanagi DO,  January 23, 2010 3:55 PM    Additional Follow-up for Phone Call Additional follow up Details #2::    pt husband aware rx sent to pharmacy................Marland KitchenFelecia Deloach CMA  January 23, 2010 4:29 PM   Prescriptions: Cathie Hoops ER 8-10 MG/5ML  LQCR (CHLORPHENIRAMINE-HYDROCODONE) 1 tsp by mouth at bedtime as needed  #6 oz x 0   Entered by:   Rolla Flatten CMA   Authorized by:   Garnet Koyanagi DO   Signed by:   Rolla Flatten CMA on 01/23/2010   Method used:   Printed then faxed to ...       [facility].* (retail)       [phone]       [place],   [address]       Ph: [phone]       Fax: [phone]   RxID:   [id number]

## 2010-07-07 NOTE — Letter (Signed)
Summary: [facility]  [facility]   Imported By: Rise Patience 01/02/2010 11:33:35  _____________________________________________________________________  External Attachment:    Type:   Image     Comment:   External Document

## 2010-07-07 NOTE — Progress Notes (Signed)
Summary: BP Issues  Phone Note Other Incoming   Caller: Manning Charity from [place] Summary of Call: Melissa from [facility] called and LM on triage VM stating that since Mrs. Segovia has stopped her BP meds her bp has been steady until today when it jumped up to 178/90 and 172/84. What would you like her to do, an OV or change of medication. You can reach her @ [phone] ext [phone] or ext [phone]. Thanks! Initial call taken by: Elna Breslow,  December 30, 2009 3:02 PM  Follow-up for Phone Call        ov Follow-up by: Garnet Koyanagi DO,  December 30, 2009 3:39 PM  Additional Follow-up for Phone Call Additional follow up Details #1::        duplicate phone note already address.Felecia Deloach CMA  December 30, 2009 3:55 PM

## 2010-07-07 NOTE — Assessment & Plan Note (Signed)
Summary: hospital followup//kn   Vital Signs:  Patient profile:   77 year old female Menstrual status:  postmenopausal Height:      68 inches Weight:      178 pounds Temp:     98.2 degrees F oral Pulse rate:   68 / minute BP sitting:   130 / 70  (left arm)  Vitals Entered By: Rolla Flatten CMA (December 23, 2009 1:44 PM) CC: hosp f/u   History of Present Illness: Pt here to f/u from hospital---she was D/cd from [facility] 12/15/2009 ---she had an episode of syncope and was dx with dehydration and low bp--- her benicar was stopped in the hospital.  She brought some home bps with her.  This am bp was 132/92 at home.  Pt is tired but is otherwise ok.     Hospital records reviewed.  Current Medications (verified): 1)  Furosemide 40 Mg  Tabs (Furosemide) .... Take 1 Tablet By Mouth Once A Day As Needed 2)  Calcium 600 600 Mg  Tabs (Calcium Carbonate) .... Take 1 Tablet By Mouth Once A Day 3)  Fish Oil 1000 Mg  Caps (Omega-3 Fatty Acids) .... Take 1 Tablet By Mouth Once A Day 4)  Multivitamins   Tabs (Multiple Vitamin) .... Take 1 Tablet By Mouth Once A Day 5)  Tussionex Pennkinetic Er 8-10 Mg/15ml  Lqcr (Chlorpheniramine-Hydrocodone) .[name].. 1 Tsp By Mouth At Bedtime As Needed 6)  Vitamin D3 2000 Unit Caps (Cholecalciferol) .... Daily.  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   Risk Factors: Exercise: no (05/28/2008)  Risk Factors: Smoking Status: never (01/01/2008)  Family History: Reviewed history from 05/28/2008 and no changes required. CAD - no Stoke - father had light stroke at age 79 DM - no Breast Ca - no Colon Ca - no Prostate Ca - no No FH of Colon Cancer: Family History of Heart Disease: Sister  Social History: Reviewed history from 05/28/2008 and no changes required. 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   Review of Systems      See HPI  Physical Exam  General:  Well-developed,well-nourished,in no acute distress; alert,appropriate and cooperative throughout examination Neck:  No deformities, masses, or tenderness noted. Lungs:  Normal respiratory effort, chest expands symmetrically. Lungs are clear to auscultation, no crackles or wheezes. Heart:  normal rate and no murmur.   Abdomen:  Bowel sounds positive,abdomen soft and non-tender without masses, organomegaly or hernias noted. Extremities:  No clubbing, cyanosis, edema, or deformity noted with normal full range of motion of all joints.   Skin:  Intact without suspicious lesions or rashes Cervical Nodes:  No lymphadenopathy noted Psych:  Cognition and judgment appear intact. Alert and cooperative with normal attention span and concentration. No apparent delusions, illusions, hallucinations   Impression & Recommendations:  Problem # 1:  DEHYDRATION (ICD-276.51) Assessment Improved  Orders: Venipuncture [id number]) TLB-BMP (Basic Metabolic Panel-BMET) (99991111)  Problem # 2:  HYPERTENSION (ICD-401.9)  off meds--- con't to watch bp  rto 1 month or sooner as needed  The following medications were removed from the medication list:    Benicar 40 Mg Tabs (Olmesartan medoxomil) .[name]... Take 1 tablet by mouth once a day Her updated medication list for this problem includes:     Furosemide 40 Mg Tabs (Furosemide) .[name]... Take 1 tablet by mouth once a day as needed  BP today: 130/70 Prior BP: 124/80 (10/29/2009)  Prior 10 Yr Risk Heart Disease: 7 % (08/10/2007)  Labs Reviewed: K+: 5.0 (07/28/2009) Creat: : 1.3 (07/28/2009)   Chol: 201 (06/23/2009)   HDL: 45.50 (06/23/2009)   LDL: 124 (09/05/2008)   TG: 158.0 (06/23/2009)  Complete Medication List: 1)  Furosemide 40 Mg Tabs (Furosemide) .... Take 1 tablet by mouth once a day as needed 2)  Calcium 600 600 Mg Tabs (Calcium carbonate) .... Take 1 tablet by mouth once a day 3)  Fish Oil 1000 Mg Caps (Omega-3 fatty acids) .... Take 1 tablet by mouth once a day 4)  Multivitamins Tabs (Multiple vitamin) .... Take 1 tablet by mouth once a day 5)  Tussionex Pennkinetic Er 8-10 Mg/27ml Lqcr (Chlorpheniramine-hydrocodone) .[name].. 1 tsp by mouth at bedtime as needed 6)  Vitamin D3 2000 Unit Caps (Cholecalciferol) .... Daily. 7)  Protonix 40 Mg Tbec (Pantoprazole sodium) .[name].. 1 by mouth once daily 8)  Aspir-low 81 Mg Tbec (Aspirin) .[name].. 1 by mouth once daily  Patient Instructions: 1)  Please schedule a follow-up appointment in 1 month.            Appended Document: hospital followup//kn    Clinical Lists Changes  Orders: Added new Service order of Specimen Handling (99000) - Signed

## 2010-07-07 NOTE — Miscellaneous (Signed)
Summary: BP Log/[facility]  BP Log/[facility]   Imported By: Edmonia James 01/29/2010 13:21:47  _____________________________________________________________________  External Attachment:    Type:   Image     Comment:   External Document

## 2010-07-07 NOTE — Assessment & Plan Note (Signed)
Summary: NECK PAIN/RH.......[name]   Vital Signs:  Patient profile:   77 year old female Menstrual status:  postmenopausal Weight:      180 pounds Temp:     98.2 degrees F oral Pulse rate:   72 / minute Pulse rhythm:   regular BP sitting:   122 / 80  (left arm) Cuff size:   regular  Vitals Entered By: [place] (July 28, 2009 11:11 AM) CC: Pt c/o dizziness since friday, woke up with neck pain today   History of Present Illness: Pt here c/o 2-3 week hx dizziness that worsened on Friday.  Pt states it feels like the room is spinning.  Right side of neck started hurting this am.  She is not sure if she slept wrong or not.  Pt denies any congestion.   No CP or SOB.     Current Medications (verified): 1)  Furosemide 40 Mg  Tabs (Furosemide) .... Take 1 Tablet By Mouth Once A Day As Needed 2)  Benicar 40 Mg  Tabs (Olmesartan Medoxomil) .... Take 1 Tablet By Mouth Once A Day 3)  Calcium 600 600 Mg  Tabs (Calcium Carbonate) .... Take 1 Tablet By Mouth Once A Day 4)  Fish Oil 1000 Mg  Caps (Omega-3 Fatty Acids) .... Take 1 Tablet By Mouth Once A Day 5)  Multivitamins   Tabs (Multiple Vitamin) .... Take 1 Tablet By Mouth Once A Day 6)  Tussionex Pennkinetic Er 8-10 Mg/25ml  Lqcr (Chlorpheniramine-Hydrocodone) .[name].. 1 Tsp By Mouth At Bedtime As Needed 7)  Cyanocobalamin 1000 Mcg/ml Soln (Cyanocobalamin) .... Injections 35ml Weekly For 1 Month Then Monthly 8)  Vitamin D3 2000 Unit Caps (Cholecalciferol) .... Daily. 9)  Meclizine Hcl 25 Mg Tabs (Meclizine Hcl) .[name].. 1 By Mouth Qid As Needed 10)  Floxin Otic Drops .[name].. 10 Gtts in Affected Ear Once Daily For 7-10 Days  Allergies: 1)  Codeine  Past History:  Past medical, surgical, family and social histories (including risk factors) reviewed for relevance to current acute and chronic problems.  Past Medical History: Reviewed history from 01/01/2008 and no changes required. Hypertension Allergic rhinitis   Past Surgical  History: Reviewed history from 01/01/2008 and no changes required. Hemorrhoidectomy Hysterectomy Knee sx Right Carpal tunnel Cardiac catherization  Family History: Reviewed history from 05/28/2008 and no changes required. CAD - no Stoke - father had light stroke at age 28 DM - no Breast Ca - no Colon Ca - no Prostate Ca - no No FH of Colon Cancer: Family History of Heart Disease: Sister  Social History: Reviewed history from 05/28/2008 and no changes required. 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   Review of Systems      See HPI  Physical Exam  General:  Well-developed,well-nourished,in no acute distress; alert,appropriate and cooperative throughout examination Eyes:  pupils equal, pupils round, pupils reactive to light, and no injection.   Ears:  R ear cerumen impaction---  irrigated and and a lot of wax removed but still impacted Nose:  External nasal examination shows no deformity or inflammation. Nasal mucosa are pink and moist without lesions or exudates. Mouth:  Oral mucosa and oropharynx without lesions or exudates.  Teeth in good repair. Neck:  No deformities, masses, or tenderness noted.supple and no carotid bruits.   Lungs:  Normal respiratory effort, chest expands symmetrically. Lungs are clear to auscultation, no crackles or wheezes. Heart:  normal  rate and no murmur.   Msk:  normal ROM.   Extremities:  No clubbing, cyanosis, edema, or deformity noted with normal full range of motion of all joints.   Neurologic:  alert & oriented X3, cranial nerves II-XII intact, strength normal in all extremities, and gait normal.   Skin:  Intact without suspicious lesions or rashes Cervical Nodes:  No lymphadenopathy noted Psych:  Oriented X3, memory intact for recent and remote, normally interactive, good eye contact, not anxious appearing, and not depressed appearing.      Impression & Recommendations:  Problem # 1:  DIZZINESS (ICD-780.4) B12 just checked and was normal maybe secondary to #2   Her updated medication list for this problem includes:    Meclizine Hcl 25 Mg Tabs (Meclizine hcl) .[name]... 1 by mouth qid as needed  Orders: TLB-BMP (Basic Metabolic Panel-BMET) (99991111) TLB-CBC Platelet - w/Differential (85025-CBCD) TLB-TSH (Thyroid Stimulating Hormone) (84443-TSH) EKG w/ Interpretation (93000)  Problem # 2:  CERUMEN IMPACTION, RIGHT (ICD-380.4)  floxin otic drops debrox rto -10 days for irrigation or sooner as needed   Orders: EKG w/ Interpretation (93000)  Problem # 3:  NECK PAIN, RIGHT (ICD-723.1) tylenol arthritis as needed pain  Discussed exercises and use of moist heat or cold and medication.   Complete Medication List: 1)  Furosemide 40 Mg Tabs (Furosemide) .... Take 1 tablet by mouth once a day as needed 2)  Benicar 40 Mg Tabs (Olmesartan medoxomil) .... Take 1 tablet by mouth once a day 3)  Calcium 600 600 Mg Tabs (Calcium carbonate) .... Take 1 tablet by mouth once a day 4)  Fish Oil 1000 Mg Caps (Omega-3 fatty acids) .... Take 1 tablet by mouth once a day 5)  Multivitamins Tabs (Multiple vitamin) .... Take 1 tablet by mouth once a day 6)  Tussionex Pennkinetic Er 8-10 Mg/61ml Lqcr (Chlorpheniramine-hydrocodone) .[name].. 1 tsp by mouth at bedtime as needed 7)  Cyanocobalamin 1000 Mcg/ml Soln (Cyanocobalamin) .... Injections 18ml weekly for 1 month then monthly 8)  Vitamin D3 2000 Unit Caps (Cholecalciferol) .... Daily. 9)  Meclizine Hcl 25 Mg Tabs (Meclizine hcl) .[name].. 1 by mouth qid as needed 10)  Floxin Otic Drops  .[name].. 10 gtts in affected ear once daily for 7-10 days Prescriptions: FLOXIN OTIC DROPS 10 gtts in affected ear once daily for 7-10 days  #10 days x 0   Entered and Authorized by:   Garnet Koyanagi DO   Signed by:   Garnet Koyanagi DO on 07/28/2009   Method used:   Faxed to ...       [facility] (retail)       [phone]       [place], [place]  [address]       Ph: [phone]       Fax: [phone]   RxID:   [id number] MECLIZINE HCL 25 MG TABS (MECLIZINE HCL) 1 by mouth qid as needed  #30 x 0   Entered and Authorized by:   Garnet Koyanagi DO   Signed by:   Garnet Koyanagi DO on 07/28/2009   Method used:   Electronically to        [facility] (retail)       [phone]       [place], [place]  [address]       Ph: [phone]       Fax:  [phone]   [id number]    EKG  Procedure date:  07/28/2009  Findings:      Normal sinus rhythm with rate of:  74 bpm

## 2010-07-07 NOTE — Assessment & Plan Note (Signed)
Summary: rto 1 month/cbs   Vital Signs:  Patient profile:   77 year old female Menstrual status:  postmenopausal Height:      68 inches Weight:      167.2 pounds BMI:     25.51 Pulse rate:   64 / minute Pulse rhythm:   regular BP sitting:   126 / 74  (left arm) Cuff size:   large  Vitals Entered By: Aron Baba CMA Deborra Medina) (April 13, 2010 9:12 AM) CC: 3 mo F/u on BP-- Pt is fasting, URI symptoms   History of Present Illness:       This is a 77 year old woman who presents with URI symptoms.  The patient complains of clear nasal discharge, but denies nasal congestion, purulent nasal discharge, sore throat, dry cough, productive cough, earache, and sick contacts.  The patient denies fever, low-grade fever (<100.5 degrees), fever of 100.5-103 degrees, fever of 103.1-104 degrees, fever to >104 degrees, stiff neck, dyspnea, wheezing, rash, vomiting, diarrhea, use of an antipyretic, and response to antipyretic.  The patient also reports sneezing, seasonal symptoms, and response to antihistamine.  The patient denies itchy watery eyes, itchy throat, headache, muscle aches, and severe fatigue.  The patient denies the following risk factors for Strep sinusitis: unilateral facial pain, unilateral nasal discharge, poor response to decongestant, double sickening, tooth pain, Strep exposure, tender adenopathy, and absence of cough.    Hypertension follow-up      The patient also presents for Hypertension follow-up.  The patient denies lightheadedness, urinary frequency, headaches, edema, impotence, rash, and fatigue.  The patient denies the following associated symptoms: chest pain, chest pressure, exercise intolerance, dyspnea, palpitations, syncope, leg edema, and pedal edema.  Compliance with medications (by patient report) has been near 100%.  The patient reports that dietary compliance has been good.  The patient reports exercising 3-4X per week.  Adjunctive measures currently used by the patient  include salt restriction.    Current Medications (verified): 1)  Furosemide 40 Mg  Tabs (Furosemide) .... Take 1 Tablet By Mouth Once A Day As Needed 2)  Calcium 600 600 Mg  Tabs (Calcium Carbonate) .... Take 1 Tablet By Mouth Once A Day 3)  Fish Oil 1000 Mg  Caps (Omega-3 Fatty Acids) .... Take 1 Tablet By Mouth Once A Day 4)  Multivitamins   Tabs (Multiple Vitamin) .... Take 1 Tablet By Mouth Once A Day 5)  Tussionex Pennkinetic Er 8-10 Mg/65ml  Lqcr (Chlorpheniramine-Hydrocodone) .[name].. 1 Tsp By Mouth At Bedtime As Needed 6)  Vitamin D3 2000 Unit Caps (Cholecalciferol) .... Daily. 7)  Protonix 40 Mg Tbec (Pantoprazole Sodium) .[name].. 1 By Mouth Once Daily 8)  Aspir-Low 81 Mg Tbec (Aspirin) .[name].. 1 By Mouth Once Daily 9)  Benicar 20 Mg Tabs (Olmesartan Medoxomil) .[name].. 1 By Mouth Once Daily 10)  Nasonex 50 Mcg/act Susp (Mometasone Furoate) .... 2 Sprays Each Nostril Once Daily  Allergies (verified): 1)  Codeine  Past History:  Past Medical History: Last updated: 01/01/2008 Hypertension Allergic rhinitis   Past Surgical History: Last updated: 01/01/2008 Hemorrhoidectomy Hysterectomy Knee sx Right Carpal tunnel Cardiac catherization  Family History: Last updated: 05/28/2008 CAD - no Stoke - father had light stroke at age 68 DM - no Breast Ca - no Colon Ca - no Prostate Ca - no No FH of Colon Cancer: Family History of Heart Disease: Sister  Social History: Last updated: 05/28/2008 Married Alcohol use-no Never Smoked Retired - Geneticist, 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   Risk Factors: Exercise: no (05/28/2008)  Risk Factors: Smoking Status: never (01/01/2008)  Family History: Reviewed history from 05/28/2008 and no changes required. CAD - no Stoke - father had light stroke at age 33 DM - no Breast Ca - no Colon Ca - no Prostate Ca - no No FH of Colon Cancer: Family History of Heart Disease: Sister  Social  History: Reviewed history from 05/28/2008 and no changes required. 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   Review of Systems      See HPI  Physical Exam  General:  Well-developed,well-nourished,in no acute distress; alert,appropriate and cooperative throughout examination Ears:  External ear exam shows no significant lesions or deformities.  Otoscopic examination reveals clear canals, tympanic membranes are intact bilaterally without bulging, retraction, inflammation or discharge. Hearing is grossly normal bilaterally. Nose:  External nasal examination shows no deformity or inflammation. Nasal mucosa are pink and moist without lesions or exudates. Mouth:  Oral mucosa and oropharynx without lesions or exudates.  Teeth in good repair. Neck:  No deformities, masses, or tenderness noted. Lungs:  Normal respiratory effort, chest expands symmetrically. Lungs are clear to auscultation, no crackles or wheezes. Heart:  Normal rate and regular rhythm. S1 and S2 normal without gallop, murmur, click, rub or other extra sounds. Extremities:  No clubbing, cyanosis, edema, or deformity noted with normal full range of motion of all joints.   Cervical Nodes:  No lymphadenopathy noted Psych:  Cognition and judgment appear intact. Alert and cooperative with normal attention span and concentration. No apparent delusions, illusions, hallucinations   Impression & Recommendations:  Problem # 1:  HYPERTENSION (ICD-401.9)  Her updated medication list for this problem includes:    Furosemide 40 Mg Tabs (Furosemide) .[name]... Take 1 tablet by mouth once a day as needed    Benicar 20 Mg Tabs (Olmesartan medoxomil) .[name]... 1 by mouth once daily  Orders: Venipuncture [id number]) TLB-B12 + Folate Pnl [id number]) TLB-Lipid Panel (80061-LIPID) TLB-BMP (Basic Metabolic Panel-BMET) (99991111) TLB-CBC Platelet -  w/Differential (85025-CBCD) TLB-Hepatic/Liver Function Pnl (80076-HEPATIC) T-Vitamin D (25-Hydroxy) [id number]) Specimen Handling (99000)  BP today: 126/74 Prior BP: 158/80 (01/01/2010)  Prior 10 Yr Risk Heart Disease: 7 % (08/10/2007)  Labs Reviewed: K+: 4.7 (12/23/2009) Creat: : 1.2 (12/23/2009)   Chol: 201 (06/23/2009)   HDL: 45.50 (06/23/2009)   LDL: 124 (09/05/2008)   TG: 158.0 (06/23/2009)  Problem # 2:  UNSPECIFIED VITAMIN D DEFICIENCY (ICD-268.9)  Orders: Venipuncture [id number]) TLB-B12 + Folate Pnl [id number]) TLB-Lipid Panel (80061-LIPID) TLB-BMP (Basic Metabolic Panel-BMET) (99991111) TLB-CBC Platelet - w/Differential (85025-CBCD) TLB-Hepatic/Liver Function Pnl (80076-HEPATIC) T-Vitamin D (25-Hydroxy) [id number]) Specimen Handling (99000)  Problem # 3:  B12 DEFICIENCY (ICD-266.2)  Orders: Venipuncture [id number]) TLB-B12 + Folate Pnl [id number]) TLB-Lipid Panel (80061-LIPID) TLB-BMP (Basic Metabolic Panel-BMET) (99991111) TLB-CBC Platelet - w/Differential (85025-CBCD) TLB-Hepatic/Liver Function Pnl (80076-HEPATIC) T-Vitamin D (25-Hydroxy) [id number]) Specimen Handling (99000)  Problem # 4:  URI (ICD-465.9)  Her updated medication list for this problem includes:    Tussionex Pennkinetic Er 8-10 Mg/40ml Lqcr (Chlorpheniramine-hydrocodone) .[name]... 1 tsp by mouth at bedtime as needed    Aspir-low 81 Mg Tbec (Aspirin) .[name]... 1 by mouth once daily  Instructed on symptomatic treatment. Call if symptoms persist or worsen.   Complete Medication List: 1)  Furosemide 40 Mg Tabs (Furosemide) .... Take 1 tablet by mouth once a day as needed 2)  Calcium 600 600  Mg Tabs (Calcium carbonate) .... Take 1 tablet by mouth once a day 3)  Fish Oil 1000 Mg Caps (Omega-3 fatty acids) .... Take 1 tablet by mouth once a day 4)  Multivitamins Tabs (Multiple vitamin) .... Take 1 tablet by mouth once a day 5)  Tussionex Pennkinetic Er 8-10 Mg/68ml Lqcr  (Chlorpheniramine-hydrocodone) .[name].. 1 tsp by mouth at bedtime as needed 6)  Vitamin D3 2000 Unit Caps (Cholecalciferol) .... Daily. 7)  Protonix 40 Mg Tbec (Pantoprazole sodium) .[name].. 1 by mouth once daily 8)  Aspir-low 81 Mg Tbec (Aspirin) .[name].. 1 by mouth once daily 9)  Benicar 20 Mg Tabs (Olmesartan medoxomil) .[name].. 1 by mouth once daily 10)  Nasonex 50 Mcg/act Susp (Mometasone furoate) .... 2 sprays each nostril once daily Prescriptions: PROTONIX 40 MG TBEC (PANTOPRAZOLE SODIUM) 1 by mouth once daily  #90 x 3   Entered and Authorized by:   Garnet Koyanagi DO   Signed by:   Garnet Koyanagi DO on 04/13/2010   Method used:   Faxed to ...       [facility])       [address]  [address]       Ph: [phone]       Fax: [phone]   RxID:   [id number] Cathie Hoops ER 8-10 MG/5ML  LQCR (CHLORPHENIRAMINE-HYDROCODONE) 1 tsp by mouth at bedtime as needed  #6 oz x 0   Entered and Authorized by:   Garnet Koyanagi DO   Signed by:   Garnet Koyanagi DO on 04/13/2010   Method used:   Print then Give to Patient   RxID:   [id number]    Orders Added: 1)  Venipuncture [id number] 2)  TLB-B12 + Folate Pnl [82746_82607-B12/FOL] 3)  TLB-Lipid Panel [80061-LIPID] 4)  TLB-BMP (Basic Metabolic Panel-BMET) 123456 5)  TLB-CBC Platelet - w/Differential [85025-CBCD] 6)  TLB-Hepatic/Liver Function Pnl [80076-HEPATIC] 7)  T-Vitamin D (25-Hydroxy) [id number] 8)  Specimen Handling [99000] 9)  Est. Patient Level IV [id number]

## 2010-07-07 NOTE — Assessment & Plan Note (Signed)
Summary: discuss bp med//fd   Vital Signs:  Patient profile:   77 year old female Menstrual status:  postmenopausal Height:      68 inches Weight:      176 pounds Temp:     98.6 degrees F oral Pulse rate:   62 / minute BP sitting:   158 / 80  (left arm)  Vitals Entered By: Rolla Flatten CMA (January 01, 2010 10:47 AM)  History of Present Illness:  Hypertension follow-up      This is a 77 year old woman who presents for Hypertension follow-up.  Pt BP has been creeping up again ---170/96 on 12/30/2009,  7/27  160/88, 170/82----[facility] the patient.  The patient denies lightheadedness, urinary frequency, headaches, edema, impotence, rash, and fatigue.  The patient denies the following associated symptoms: chest pain, chest pressure, exercise intolerance, dyspnea, palpitations, syncope, leg edema, and pedal edema.  The patient reports that dietary compliance has been good.  Adjunctive measures currently used by the patient include salt restriction.    Current Medications (verified): 1)  Furosemide 40 Mg  Tabs (Furosemide) .... Take 1 Tablet By Mouth Once A Day As Needed 2)  Calcium 600 600 Mg  Tabs (Calcium Carbonate) .... Take 1 Tablet By Mouth Once A Day 3)  Fish Oil 1000 Mg  Caps (Omega-3 Fatty Acids) .... Take 1 Tablet By Mouth Once A Day 4)  Multivitamins   Tabs (Multiple Vitamin) .... Take 1 Tablet By Mouth Once A Day 5)  Tussionex Pennkinetic Er 8-10 Mg/83ml  Lqcr (Chlorpheniramine-Hydrocodone) .[name].. 1 Tsp By Mouth At Bedtime As Needed 6)  Vitamin D3 2000 Unit Caps (Cholecalciferol) .... Daily. 7)  Protonix 40 Mg Tbec (Pantoprazole Sodium) .[name].. 1 By Mouth Once Daily 8)  Aspir-Low 81 Mg Tbec (Aspirin) .[name].. 1 By Mouth Once Daily 9)  Benicar 20 Mg Tabs (Olmesartan Medoxomil) .[name].. 1 By Mouth Once Daily  Allergies (verified): 1)  Codeine  Past History:  Past medical, surgical, family and social histories (including risk factors) reviewed for relevance to  current acute and chronic problems.  Past Medical History: Reviewed history from 01/01/2008 and no changes required. Hypertension Allergic rhinitis   Past Surgical History: Reviewed history from 01/01/2008 and no changes required. Hemorrhoidectomy Hysterectomy Knee sx Right Carpal tunnel Cardiac catherization  Family History: Reviewed history from 05/28/2008 and no changes required. CAD - no Stoke - father had light stroke at age 56 DM - no Breast Ca - no Colon Ca - no Prostate Ca - no No FH of Colon Cancer: Family History of Heart Disease: Sister  Social History: Reviewed history from 05/28/2008 and no changes required. 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   Review of Systems      See HPI  Physical Exam  General:  Well-developed,well-nourished,in no acute distress; alert,appropriate and cooperative throughout examination Neck:  No deformities, masses, or tenderness noted. Lungs:  Normal respiratory effort, chest expands symmetrically. Lungs are clear to auscultation, no crackles or wheezes. Heart:  normal rate and no murmur.   Extremities:  No clubbing, cyanosis, edema, or deformity noted with normal full range of motion of all joints.   Psych:  Oriented X3 and normally interactive.     Impression & Recommendations:  Problem # 1:  HYPERTENSION (ICD-401.9)  Her updated medication list for this problem includes:    Furosemide 40 Mg Tabs (Furosemide) .Marland KitchenMarland KitchenMarland KitchenMarland Kitchen  Take 1 tablet by mouth once a day as needed    Benicar 20 Mg Tabs (Olmesartan medoxomil) .[name]... 1 by mouth once daily  BP today: 158/80 Prior BP: 130/70 (12/23/2009)  Prior 10 Yr Risk Heart Disease: 7 % (08/10/2007)  Labs Reviewed: K+: 4.7 (12/23/2009) Creat: : 1.2 (12/23/2009)   Chol: 201 (06/23/2009)   HDL: 45.50 (06/23/2009)   LDL: 124 (09/05/2008)   TG: 158.0 (06/23/2009)  Complete Medication List: 1)   Furosemide 40 Mg Tabs (Furosemide) .... Take 1 tablet by mouth once a day as needed 2)  Calcium 600 600 Mg Tabs (Calcium carbonate) .... Take 1 tablet by mouth once a day 3)  Fish Oil 1000 Mg Caps (Omega-3 fatty acids) .... Take 1 tablet by mouth once a day 4)  Multivitamins Tabs (Multiple vitamin) .... Take 1 tablet by mouth once a day 5)  Tussionex Pennkinetic Er 8-10 Mg/34ml Lqcr (Chlorpheniramine-hydrocodone) .[name].. 1 tsp by mouth at bedtime as needed 6)  Vitamin D3 2000 Unit Caps (Cholecalciferol) .... Daily. 7)  Protonix 40 Mg Tbec (Pantoprazole sodium) .[name].. 1 by mouth once daily 8)  Aspir-low 81 Mg Tbec (Aspirin) .[name].. 1 by mouth once daily 9)  Benicar 20 Mg Tabs (Olmesartan medoxomil) .[name].. 1 by mouth once daily  Patient Instructions: 1)  call us in 2 weeks with BP 2)  keep appointment in August

## 2010-07-07 NOTE — Assessment & Plan Note (Signed)
Summary: FLUSHOT/KN  Nurse Visit   Allergies: 1)  Codeine  Orders Added: 1)  Flu Vaccine 58yrs + MEDICARE PATIENTS [Q2039] 2)  Administration Flu vaccine - MCR [id number] Flu Vaccine Consent Questions     Do you have a history of severe allergic reactions to this vaccine? no    Any prior history of allergic reactions to egg and/or gelatin? no    Do you have a sensitivity to the preservative Thimersol? no    Do you have a past history of Guillan-Barre Syndrome? no    Do you currently have an acute febrile illness? no    Have you ever had a severe reaction to latex? no    Vaccine information given and explained to patient? yes    Are you currently pregnant? no    Lot Number:[id number]   Exp Date:12/05/2010   Site Given  Left Deltoid IM

## 2010-07-07 NOTE — Letter (Signed)
Summary: [facility]   Imported By: Edmonia James 03/28/2010 09:31:07  _____________________________________________________________________  External Attachment:    Type:   Image     Comment:   External Document

## 2010-07-07 NOTE — Assessment & Plan Note (Signed)
Summary: R[place][name]   Vital Signs:  Patient profile:   77 year old female Menstrual status:  postmenopausal Weight:      170.38 pounds Pulse rate:   76 / minute Pulse rhythm:   regular BP sitting:   124 / 80  (left arm) Cuff size:   regular  Vitals Entered By: Allyn Kenner CMA (Oct 29, 2009 11:17 AM) CC: Pt here for a red rash in vaginal region., Rash   History of Present Illness:  Rash      This is a 77 year old woman who presents with Rash.  The symptoms began 1 week ago.  Pt here c/o itchy rash in groin --- somewhat better with corn starch but not completely gone.  The patient complains of macules, itching, and redness.  The rash is located on the groin.  The rash is worse with heat.  The patient denies the following symptoms: fever, headache, facial swelling, tongue swelling, burning, difficulty breathing, abdominal pain, nausea, vomiting, diarrhea, dizziness, sore throat, dysuria, eye symptoms, arthralgias, and vaginal discharge.  The patient denies history of recent tick bite, recent tick exposure, other insect bite, recent infection, recent antibiotic use, new medication, new clothing, new topical exposure, recent travel, pet/animal contact, thyroid disease, chronic liver disease, autoimmune disease, chronic edema, and prior STD.    Allergies: 1)  Codeine  Physical Exam  General:  Well-developed,well-nourished,in no acute distress; alert,appropriate and cooperative throughout examination Skin:  + errythematous rash in groin consistent with tinea  no ulcerations.   Psych:  Oriented X3 and normally interactive.     Impression & Recommendations:  Problem # 1:  TINEA CRURIS (ICD-110.3)  naftin cream once daily rto if no improvement in 4-5 days  Orders: Prescription Created Electronically [phone])  Complete Medication List: 1)  Furosemide 40 Mg Tabs (Furosemide) .... Take 1 tablet by mouth once a day as needed 2)  Benicar 40 Mg Tabs (Olmesartan  medoxomil) .... Take 1 tablet by mouth once a day 3)  Calcium 600 600 Mg Tabs (Calcium carbonate) .... Take 1 tablet by mouth once a day 4)  Fish Oil 1000 Mg Caps (Omega-3 fatty acids) .... Take 1 tablet by mouth once a day 5)  Multivitamins Tabs (Multiple vitamin) .... Take 1 tablet by mouth once a day 6)  Tussionex Pennkinetic Er 8-10 Mg/69ml Lqcr (Chlorpheniramine-hydrocodone) .[name].. 1 tsp by mouth at bedtime as needed 7)  Vitamin D3 2000 Unit Caps (Cholecalciferol) .... Daily. 8)  Meclizine Hcl 25 Mg Tabs (Meclizine hcl) .[name].. 1 by mouth qid as needed 9)  Naftin 1 % Crea (Naftifine hcl) .... Apply to affected area once daily Prescriptions: TUSSIONEX PENNKINETIC ER 8-10 MG/5ML  LQCR (CHLORPHENIRAMINE-HYDROCODONE) 1 tsp by mouth at bedtime as needed  #6 oz x 0   Entered and Authorized by:   Garnet Koyanagi DO   Signed by:   Garnet Koyanagi DO on 10/29/2009   Method used:   Print then Give to Patient   [id number] NAFTIN 1 % CREA (NAFTIFINE HCL) apply to affected area once daily  #30g x 1   Entered and Authorized by:   Garnet Koyanagi DO   Signed by:   Garnet Koyanagi DO on 10/29/2009   Method used:   Electronically to        [facility] (retail)       [phone]       [facility],  [place]  [address]       Ph: [phone]       Fax: [phone]   [id number]

## 2010-07-09 NOTE — Progress Notes (Signed)
Summary: info about b-12 lab  Phone Note Call from Patient   Caller: Patient Summary of Call: Patient called and made an apointment to see Dr Etter Sjogren because she has finished her "four weekly b-12 injections"---she says she is supposed to see Dr Etter Sjogren, so I made her an appointment for 06/05/2010 at 2:00  Does she need this doctor appt or does she need just labs?   if labs only, what code and diag??    then I will call her and resch from doctor appointment to just lab appointment?? Initial call taken by: Berneta Sages,  May 25, 2010 11:36 AM  Follow-up for Phone Call        Per 04-13-10 labs Pt to get b12 injections weekly x4 then start  monthly injection. Pt to have b12 level check before 5th monthly injection then get injection after lab. Once labs back that will determine if pt to continue with monthly or weekly injections. Pt need nurse visit for injection.Babs Sciara Deloach CMA  May 25, 2010 12:35 PM..Marland KitchenMarland KitchenFelecia Deloach CMA  May 25, 2010 12:35 PM    Additional Follow-up for Phone Call Additional follow up Details #1::        Patient is resident of [facility] which has a Science writer on staff who gives patient her b-12 shot--she says she had her last b-12 shot on 05/18/2010---so I set her up for a lab visit on 06/17/2010 at 9:20am---she will then advise the nurse at [facility] that she can take her first monthly b-12 shot after the labs--she is aware that results of the lab work will determine timing of future b-12 shots Additional Follow-up by: Berneta Sages,  May 25, 2010 2:05 PM    Additional Follow-up for Phone Call Additional follow up Details #2::    noted................Marland KitchenFelecia Deloach CMA  May 25, 2010 2:41 PM

## 2010-07-09 NOTE — Assessment & Plan Note (Signed)
Summary: FOR STOMACH PROBLEM//PH   Vital Signs:  Patient profile:   77 year old female Menstrual status:  postmenopausal Weight:      173.0 pounds Pulse rate:   72 / minute Pulse rhythm:   regular BP sitting:   124 / 80  (left arm) Cuff size:   large  Vitals Entered By: Aron Baba CMA Deborra Medina) (June 18, 2010 1:51 PM) CC: per pt she has vomiting,nausea, pain between the shoulder the radites to the right side--per pt it comes and goes but ok today. Wants refill of Nasonex   History of Present Illness: Pt here c/o NV , pain between the shoulder blades.  + abd pain RUQ. This has been going on for a long time---years but this last episode was about 3 weeks ago and was worse.     Current Medications (verified): 1)  Furosemide 40 Mg  Tabs (Furosemide) .... Take 1 Tablet By Mouth Once A Day As Needed 2)  Calcium 600 600 Mg  Tabs (Calcium Carbonate) .... Take 1 Tablet By Mouth Once A Day 3)  Fish Oil 1000 Mg  Caps (Omega-3 Fatty Acids) .... Take 1 Tablet By Mouth Once A Day 4)  Multivitamins   Tabs (Multiple Vitamin) .... Take 1 Tablet By Mouth Once A Day 5)  Tussionex Pennkinetic Er 8-10 Mg/36ml  Lqcr (Chlorpheniramine-Hydrocodone) .[name].. 1 Tsp By Mouth At Bedtime As Needed 6)  Vitamin D3 2000 Unit Caps (Cholecalciferol) .... Daily. 7)  Protonix 40 Mg Tbec (Pantoprazole Sodium) .[name].. 1 By Mouth Once Daily 8)  Aspir-Low 81 Mg Tbec (Aspirin) .[name].. 1 By Mouth Once Daily 9)  Benicar 20 Mg Tabs (Olmesartan Medoxomil) .[name].. 1 By Mouth Once Daily 10)  Nasonex 50 Mcg/act Susp (Mometasone Furoate) .... 2 Sprays Each Nostril Once Daily 11)  Cyanocobalamin 1000 Mcg/ml Soln (Cyanocobalamin) .... Inject 46ml Im Once Weekly Then Monthly  Allergies (verified): 1)  Codeine  Past History:  Past medical, surgical, family and social histories (including risk factors) reviewed for relevance to current acute and chronic problems.  Past Medical History: Reviewed history from 01/01/2008 and no changes  required. Hypertension Allergic rhinitis   Past Surgical History: Reviewed history from 01/01/2008 and no changes required. Hemorrhoidectomy Hysterectomy Knee sx Right Carpal tunnel Cardiac catherization  Family History: Reviewed history from 05/28/2008 and no changes required. CAD - no Stoke - father had light stroke at age 100 DM - no Breast Ca - no Colon Ca - no Prostate Ca - no No FH of Colon Cancer: Family History of Heart Disease: Sister  Social History: Reviewed history from 05/28/2008 and no changes required. 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   Review of Systems      See HPI  Physical Exam  General:  Well-developed,well-nourished,in no acute distress; alert,appropriate and cooperative throughout examination Neck:  No deformities, masses, or tenderness noted. Lungs:  Normal respiratory effort, chest expands symmetrically. Lungs are clear to auscultation, no crackles or wheezes. Abdomen:  + RUQ pain with palpation soft guarding.   Psych:  Oriented X3 and normally interactive.     Impression & Recommendations:  Problem # 1:  ABDOMINAL PAIN, RIGHT UPPER QUADRANT (ICD-789.01)  Orders: Venipuncture [id number]) TLB-BMP (Basic Metabolic Panel-BMET) (99991111) TLB-CBC Platelet - w/Differential (85025-CBCD) TLB-Hepatic/Liver Function Pnl (80076-HEPATIC) TLB-Amylase (82150-AMYL) TLB-Lipase (83690-LIPASE) Radiology Referral (Radiology) Specimen Handling (99000)  Discussed symptom control with the patient.   Complete Medication List: 1)  Furosemide 40 Mg Tabs (Furosemide) .... Take 1 tablet by mouth once a day as needed 2)  Calcium 600 600 Mg Tabs (Calcium carbonate) .... Take 1 tablet by mouth once a day 3)  Fish Oil 1000 Mg Caps (Omega-3 fatty acids) .... Take 1 tablet by mouth once a day 4)  Multivitamins Tabs (Multiple vitamin) .... Take 1 tablet by mouth  once a day 5)  Tussionex Pennkinetic Er 8-10 Mg/41ml Lqcr (Chlorpheniramine-hydrocodone) .[name].. 1 tsp by mouth at bedtime as needed 6)  Vitamin D3 2000 Unit Caps (Cholecalciferol) .... Daily. 7)  Protonix 40 Mg Tbec (Pantoprazole sodium) .[name].. 1 by mouth once daily 8)  Aspir-low 81 Mg Tbec (Aspirin) .[name].. 1 by mouth once daily 9)  Benicar 20 Mg Tabs (Olmesartan medoxomil) .[name].. 1 by mouth once daily 10)  Nasonex 50 Mcg/act Susp (Mometasone furoate) .... 2 sprays each nostril once daily 11)  Cyanocobalamin 1000 Mcg/ml Soln (Cyanocobalamin) .... Inject 45ml im once weekly then monthly  Patient Instructions: 1)  if pain increases --go to ER   Orders Added: 1)  Venipuncture [id number] 2)  TLB-BMP (Basic Metabolic Panel-BMET) 123456 3)  TLB-CBC Platelet - w/Differential [85025-CBCD] 4)  TLB-Hepatic/Liver Function Pnl [80076-HEPATIC] 5)  TLB-Amylase [82150-AMYL] 6)  TLB-Lipase [83690-LIPASE] 7)  Radiology Referral [Radiology] 8)  Specimen Handling [99000] 9)  Est. Patient Level III [id number]

## 2010-08-30 ENCOUNTER — Emergency Department (HOSPITAL_BASED_OUTPATIENT_CLINIC_OR_DEPARTMENT_OTHER)
Admission: EM | Admit: 2010-08-30 | Discharge: 2010-08-30 | Disposition: A | Discharge status: Home / Self Care | Payer: Medicare Other | Attending: Emergency Medicine | Admitting: Emergency Medicine

## 2010-08-30 ENCOUNTER — Emergency Department (INDEPENDENT_AMBULATORY_CARE_PROVIDER_SITE_OTHER): Payer: Medicare Other

## 2010-08-30 DIAGNOSIS — S2239XA Fracture of one rib, unspecified side, initial encounter for closed fracture: Secondary | ICD-10-CM | POA: Insufficient documentation

## 2010-08-30 DIAGNOSIS — S20219A Contusion of unspecified front wall of thorax, initial encounter: Secondary | ICD-10-CM

## 2010-08-30 DIAGNOSIS — W010XXA Fall on same level from slipping, tripping and stumbling without subsequent striking against object, initial encounter: Secondary | ICD-10-CM | POA: Insufficient documentation

## 2010-08-30 DIAGNOSIS — Y9229 Other specified public building as the place of occurrence of the external cause: Secondary | ICD-10-CM | POA: Insufficient documentation

## 2010-08-30 DIAGNOSIS — W1809XA Striking against other object with subsequent fall, initial encounter: Secondary | ICD-10-CM

## 2010-10-20 NOTE — Op Note (Signed)
NAME:  Deanna Shields, Deanna Shields              ACCOUNT NO.:  [id number]   MEDICAL RECORD NO.:  [id number]          PATIENT TYPE:  AMB   LOCATION:  [facility]                         FACILITY:  [facility]   PHYSICIAN:  Susa Day, M.D.    DATE OF BIRTH:  December 22, 1928   DATE OF PROCEDURE:  01/29/2008  DATE OF DISCHARGE:                               OPERATIVE REPORT   PREOPERATIVE DIAGNOSIS:  Medial meniscus tear of the right knee.   POSTOPERATIVE DIAGNOSES:  Medial meniscus tear of the right knee,  lateral meniscus tear, grade 3 chondromalacia patella and lateral  femoral condyle.   PROCEDURE PERFORMED:  Right knee arthroscopy, partial medial and lateral  meniscectomy, chondroplasty of patella and lateral femoral condyle.   ANESTHESIA:  General.   BRIEF HISTORY AND INDICATIONS:  A 76 year old, medial meniscus tear,  pain, mechanical symptoms, indicated for debridement, failing  conservative treatment.  The risks and benefits discussed, including  bleeding, infection, no change in symptoms, worsening in symptoms, need  for total knee arthroplasty, DVT, PE, anesthetic complications, etc.   TECHNIQUE:  With the patient in the supine position after the induction  of adequate anesthesia and 1 gm Kefzol, the right lower extremity was  prepped and draped in the usual sterile fashion.  A lateral parapatellar  portal and superomedial parapatellar portal was fashioned with a #11  blade.  Ingress cannula atraumatically placed.  Irrigant was utilized to  insufflate the joint.  Under direct visualization a medial parapatellar  portal was fashioned with a #11 blade after localization with an 18  gauge needle, sparing the medial meniscus.  Note, there was extensive  grade 3 changes of all compartments.  The posterior third of the medial  meniscus was unstable as identified.  A straight basket rongeur utilized  to perform a partial medial meniscectomy to a stable base.  Further  contoured with a 42 Cuda shaver.   Chondroplasties of all three  compartments were performed.  The patella, and particularly the lateral  compartment had a fairly large crater noted, grade 3 changes.  There was  degenerative tearing of the inner aspect of the lateral meniscus and a  partial meniscectomy to a stable base.  The inner one-third was removed.  ACL and PCL are unremarkable.  The gutters were unremarkable.  Normal  patellofemoral tracking.  The knee was copiously lavaged.  The  compartments were revisited and no pathology amenable to further  surgical intervention.   The knee was copiously lavaged and all instrumentation was removed.  The  portals were closed 4-0 nylon simple suture.  Quarter-percent Marcaine  with epinephrine was infiltrated in the joint.  The wound was dressed  sterilely.  Awoken without difficulty and transported to the recovery  room in satisfactory condition.   The patient tolerated the procedure well with no complications.      Susa Day, M.D.  Electronically Signed     JB/MEDQ  D:  01/29/2008  T:  01/29/2008  Job:  [id number]

## 2010-10-23 NOTE — Cardiovascular Report (Signed)
[facility]  Patient:    Deanna Shields, Deanna Shields                     MRN: [id number] Proc. Date: 10/23/99 Adm. Date:  [id number] Disc. Date: [id number] Attending:  Octavia Heir CC:         Roswell Miners, M.D.                        Cardiac Catheterization  PROCEDURES: 1. Left heart catheterization. 2. Coronary angiography. 3. Left ventriculogram. 4. Distal aortogram.  ATTENDING FOR THE CASE:  Alla German, M.D.  COMPLICATIONS:  None.  INDICATIONS:  Ms. Layden is a 77 year old white female patient of Dr. Justine Null with a history of hypercholesterolemia and history of CAD who was recently seen in our office for increasing lower extremity edema and intermittent substernal chest pain.  She underwent a 2D echocardiogram which revealed normal LV function with EF of 60% and no significant valvular abnormalities. Additionally, she underwent a stress Cardiolite on Oct 12, 1999, which revealed mild anterolateral ischemia with a normal EF.  She continued to have intermittent chest pain but has not used any sublingual nitroglycerin.  She is now referred for cardiac catheterization to define her coronary anatomy.  DESCRIPTION OF PROCEDURE:  After giving informed written consent, the patient was brought to the cardiac catheterization lab where right and left groins were shaved, prepped, and draped in the usual sterile fashion.  ECG monitoring was established.  Using modified Seldinger technique, a 6-French arterial sheath was inserted in the right femoral artery.  The 6-French diagnostic catheters were then used to perform diagnostic angiography.  ANGIOGRAPHIC DATA: 1. This revealed a medium sized left main with no significant disease. 2. The LAD is a medium sized vessel which coursed to the apex and gave rise to    two diagonal branches.  The LAD is noted to have a 40% mid vessel stenotic    lesion just beyond the takeoff of the second diagonal.  There is a  distal    20% stenotic lesion.  The first diagonal is a small vessel with no    significant disease.  The second diagonal is a medium sized vessel which    bifurcates in its distal portion and has no significant disease. 3. The left circumflex is a medium sized vessel which courses in the AV groove    and gives rise to two obtuse marginal branches.  The AV groove circumflex    has no significant disease.  The first OM is a medium sized vessel with no    significant disease.  The second OM is a large vessel with no significant    disease. 4. The right coronary artery is a large vessel which is dominant and gives    rise to both a PDA as well as a posterolateral branch.  RCA, PDA,    posterolateral branch have no significant disease.  The left ventriculogram reveals preserved EF calculated at 55%.  There is no significant MR.  Distal aortogram reveals a patent left renal artery.  The right renal artery is not opacified well.  The distal aorta as well as the proximal iliac bifurcations all appear normal.  Following the procedure, a Perclose device was then used to close the right femoral arterial puncture site without complications.  One gram of Ancef was given for prophylaxis.  CONCLUSIONS: 1. No significant CAD. 2. Normal LV systolic function. 3.  Normal distal aorta. 4. Mild systemic hypertension. DD:  10/23/99 TD:  10/27/99 Job: [id number]

## 2010-10-23 NOTE — Assessment & Plan Note (Signed)
[facility] OFFICE NOTE   MALEKA, DEMUTH                     MRN:          [id number]  DATE:07/06/2006                            DOB:          1928/08/19    REASON FOR VISIT:  Establish care and hypertension.  Ms. Thorngren is a 77-  year-old female with no significant past medical history until about 3  to 4 weeks ago when she went to see her dentist.  At that point she was  noted to have a blood pressure of 188/90.  She got very concerned and  decided to make an appointment to establish care here in our office.  She was previously seeing physician in town who has since retired.  In  the interim she stated her head felt a little funny.  Giving the  concern about her blood pressure, she went to [facility] about 2 weeks  ago.  She was subsequently started on hydrochlorothiazide 25 mg.  Soon  thereafter she noted significant cramping of her legs, arms and hands.  She also complained of increased lightheadedness.  She reported calling  [facility], thus subsequently discontinued the hydrochlorothiazide due  to the above symptoms about a week ago.  Since then, her symptoms have  completely resolved.  Ms. Emsley states that she has never had a history  of hypertension, and rarely takes medications.   PAST MEDICAL HISTORY:  1. Dependent edema specifically in the summer.  2. Newly diagnosed hypertension.  3. Recurrent chronic cough previously evaluated by ENT, and uses      Tussionex p.r.n.   SURGICAL HISTORY:  1. Hysterectomy/BSO.  2. Hemorrhoidectomy.  3. Bilateral knee surgery, arthroscopic.  4. Carpal tunnel release.  5. Cardiac catheterization, which was negative per patient, several      years ago.   MEDICATIONS:  1. Hydrochlorothiazide 25 mg, discontinued 1 week ago.  2. Furosemide 40 mg p.r.n. in the summer as needed.  3. Fish oil capsules 1200 mg daily.  4. Lycopene 700 mcg daily.  5. Multiple  vitamin.  6. Tussionex 1/2 teaspoon nightly p.r.n. cough.   ALLERGIES:  CODEINE MAKES HER NAUSEOUS.   FAMILY HISTORY:  Mother and father passed away with natural causes in  their 47s.  She had 2 brothers which passed away, one with an aneurysm  and another one secondary to emphysema.  She has history of 3 sisters,  one passed away secondary to asthma/pneumonia complications.   She is married with no children.  Denies any alcohol or tobacco use.   HEALTH MAINTENANCE:  Patient reports that she had a colonoscopy less  than 5 years ago by Dr. Sharlett Iles, and it was completely unremarkable  per patient.  In regards to her mammogram, she states that she gets them  infrequently, and that is her choice.  She is aware of reasons for  mammograms.   REVIEW OF SYSTEMS:  As per HPI, otherwise unremarkable.   OBJECTIVE:  VITAL SIGNS:  Weight 169.4.  Pulse 76.  Blood pressure  162/86.  GENERAL:  We have a pleasant elderly female, in no acute distress.  Answers questions appropriately.  Alert and oriented x3.  NECK:  Supple.  No lymphadenopathy, carotid bruits or JVD.  No  thyromegaly was noted.  LUNGS:  Clear.  HEART:  Regular rate and rhythm.  Normal S1 and S2.  No murmurs, gallops  or rubs.  EXTREMITIES:  No cyanosis, clubbing or edema.  EKG showed normal sinus rhythm with no ST elevations or depressions.  No  Q waves, PVCs or PACs noted.   IMPRESSION:  A 77 year old female who has newly diagnosed hypertension.  She had side effects from hydrochlorothiazide, and thus discontinued the  medication.   PLAN:  1. After a long discussion, we agreed on using Benicar 20 mg daily.      We considered Norvasc, but given the history of dependent edema,      thought that this would not be adventitious for the patient.      Additionally, considered beta blocker, but given her age, the side      effect profile is more likely to effect her.  2. Patient was advised of possible side effects of Benicar.   She is to      follow up with me in 2 weeks.  3. We will obtain a comprehensive metabolic profile, TSH and CBC.  At      the next visit we will obtain a fasting lipid profile.  Patient      expressed understanding.     Leone Haven, M.D.  Electronically Signed    LA/MedQ  DD: 07/06/2006  DT: 07/06/2006  Job #: [id number]

## 2011-01-14 ENCOUNTER — Other Ambulatory Visit: Payer: Self-pay | Admitting: Family Medicine

## 2011-01-14 MED ORDER — OLMESARTAN MEDOXOMIL 20 MG PO TABS: 20.0000 mg | ORAL_TABLET | Freq: Every day | ORAL | Status: DC

## 2011-01-14 NOTE — Telephone Encounter (Signed)
Rx faxed Pt due for OV

## 2011-01-20 NOTE — Telephone Encounter (Signed)
Has appt for CPX and labs on 02/24/2011

## 2011-01-25 ENCOUNTER — Telehealth: Payer: Self-pay | Admitting: Family Medicine

## 2011-01-26 MED ORDER — LOSARTAN POTASSIUM 50 MG PO TABS: 50.0000 mg | ORAL_TABLET | Freq: Every day | ORAL | Status: DC

## 2011-01-26 NOTE — Telephone Encounter (Signed)
Try losarten 50 mg 1 po qd  #30  ---ov 2 weeks to see if it is strong enough

## 2011-01-26 NOTE — Telephone Encounter (Signed)
Prior auth denied Pt must have tried and failed preferred RAA agent. Preferred meds: losartan, losartan hctz, diovan, diovan hct, exforge, micardis, micardis hct, and twynsta. Please advise

## 2011-01-26 NOTE — Telephone Encounter (Signed)
Discuss with patient  

## 2011-01-26 NOTE — Telephone Encounter (Signed)
Left message to call office

## 2011-02-24 ENCOUNTER — Encounter: Payer: Self-pay | Admitting: Family Medicine

## 2011-02-24 ENCOUNTER — Ambulatory Visit (INDEPENDENT_AMBULATORY_CARE_PROVIDER_SITE_OTHER): Payer: Medicare Other | Admitting: Family Medicine

## 2011-02-24 VITALS — BP 134/86 | HR 70 | Temp 98.6°F | Ht 64.5 in | Wt 175.4 lb

## 2011-02-24 DIAGNOSIS — I10 Essential (primary) hypertension: Secondary | ICD-10-CM

## 2011-02-24 DIAGNOSIS — Z780 Asymptomatic menopausal state: Secondary | ICD-10-CM

## 2011-02-24 DIAGNOSIS — R05 Cough: Secondary | ICD-10-CM

## 2011-02-24 DIAGNOSIS — Z0000 Encounter for general adult medical examination without abnormal findings: Secondary | ICD-10-CM

## 2011-02-24 LAB — LIPID PANEL
LDL Cholesterol: 72 mg/dL (ref 0–99)
Total CHOL/HDL Ratio: 3

## 2011-02-24 LAB — HEPATIC FUNCTION PANEL
ALT: 16 U/L (ref 0–35)
AST: 23 U/L (ref 0–37)
Bilirubin, Direct: 0 mg/dL (ref 0.0–0.3)
Total Bilirubin: 0.7 mg/dL (ref 0.3–1.2)

## 2011-02-24 LAB — CBC WITH DIFFERENTIAL/PLATELET
Basophils Relative: 0.4 % (ref 0.0–3.0)
Eosinophils Relative: 4.1 % (ref 0.0–5.0)
HCT: 38.5 % (ref 36.0–46.0)
Lymphs Abs: 2.3 10*3/uL (ref 0.7–4.0)
Monocytes Relative: 7.6 % (ref 3.0–12.0)
Neutrophils Relative %: 56 % (ref 43.0–77.0)
Platelets: 274 10*3/uL (ref 150.0–400.0)
RBC: 4.09 Mil/uL (ref 3.87–5.11)
WBC: 7.1 10*3/uL (ref 4.5–10.5)

## 2011-02-24 LAB — POCT URINALYSIS DIPSTICK
Glucose, UA: NEGATIVE
Leukocytes, UA: NEGATIVE
Nitrite, UA: NEGATIVE
Urobilinogen, UA: 0.2

## 2011-02-24 LAB — BASIC METABOLIC PANEL
BUN: 15 mg/dL (ref 6–23)
GFR: 48.69 mL/min — ABNORMAL LOW (ref >60.00)
Potassium: 4.9 mEq/L (ref 3.5–5.1)
Sodium: 142 mEq/L (ref 135–145)

## 2011-02-24 MED ORDER — HYDROCOD POLST-CHLORPHEN POLST 10-8 MG/5ML PO LQCR: 5.0000 mL | Freq: Every evening | ORAL | Status: DC | PRN

## 2011-02-24 MED ORDER — LOSARTAN POTASSIUM 100 MG PO TABS: 100.0000 mg | ORAL_TABLET | Freq: Every day | ORAL | Status: DC

## 2011-02-24 NOTE — Patient Instructions (Signed)

## 2011-02-24 NOTE — Progress Notes (Signed)
Subjective:    Deanna Shields is a 77 y.o. female who presents for Medicare Annual/Subsequent preventive examination.  Preventive Screening-Counseling & Management  Tobacco History  Smoking status  . Never Smoker   Smokeless tobacco  . Never Used     Problems Prior to Visit 1.   Current Problems (verified) Patient Active Problem List  Diagnoses  . TINEA CRURIS  . B12 DEFICIENCY  . UNSPECIFIED VITAMIN D DEFICIENCY  . DEHYDRATION  . CERUMEN IMPACTION, RIGHT  . HYPERTENSION  . INTERNAL HEMORRHOIDS  . URI  . Allergic Rhinitis, Cause Unspecified  . GERD  . HIATAL HERNIA  . NECK PAIN, RIGHT  . BACKACHE NOS  . OSTEOPENIA  . DIZZINESS  . DEPENDENT EDEMA  . SHORTNESS OF BREATH  . COUGH, CHRONIC  . DYSURIA  . ABDOMINAL PAIN, RIGHT UPPER QUADRANT    Medications Prior to Visit Current Outpatient Prescriptions on File Prior to Visit  Medication Sig Dispense Refill  . losartan (COZAAR) 50 MG tablet Take 1 tablet (50 mg total) by mouth daily.  90 tablet  0    Current Medications (verified) Current Outpatient Prescriptions  Medication Sig Dispense Refill  . aspirin 81 MG tablet Take 81 mg by mouth daily.        . calcium carbonate (OS-CAL) 600 MG TABS Take 600 mg by mouth daily.        . chlorpheniramine-HYDROcodone (TUSSIONEX PENNKINETIC ER) 10-8 MG/5ML LQCR Take 5 mLs by mouth at bedtime as needed.        . fish oil-omega-3 fatty acids 1000 MG capsule Take 1 g by mouth daily.        . furosemide (LASIX) 40 MG tablet Take 40 mg by mouth daily as needed.        [name] losartan (COZAAR) 50 MG tablet Take 1 tablet (50 mg total) by mouth daily.  90 tablet  0     Allergies (verified) Codeine   PAST HISTORY  Family History Family History  Problem Relation Age of Onset  . Stroke Father   . Heart disease Sister     Social History History  Substance Use Topics  . Smoking status: Never Smoker   . Smokeless tobacco: Never Used  . Alcohol Use: No     Are there  smokers in your home (other than you)? No  Risk Factors Current exercise habits: The patient does not participate in regular exercise at present.  Dietary issues discussed: na   Cardiac risk factors: advanced age (older than 50 for men, 3 for women), hypertension and sedentary lifestyle.  Depression Screen (Note: if answer to either of the following is "Yes", a more complete depression screening is indicated)   Over the past two weeks, have you felt down, depressed or hopeless? No  Over the past two weeks, have you felt little interest or pleasure in doing things? No  Have you lost interest or pleasure in daily life? No  Do you often feel hopeless? No  Do you cry easily over simple problems? No  Activities of Daily Living In your present state of health, do you have any difficulty performing the following activities?:  Driving? No Managing money?  No Feeding yourself? No Getting from bed to chair? no Climbing a flight of stairs? No Preparing food and eating?: No Bathing or showering? No Getting dressed: No Getting to the toilet? No Using the toilet:No Moving around from place to place: No In the past year have you fallen or had a near  fall?:No   Are you sexually active?  No  Do you have more than one partner?  No  Hearing Difficulties: No Do you often ask people to speak up or repeat themselves? No Do you experience ringing or noises in your ears? No Do you have difficulty understanding soft or whispered voices? No   Do you feel that you have a problem with memory? No  Do you often misplace items? No  Do you feel safe at home?  No  Cognitive Testing  Alert? Yes  Normal Appearance?Yes  Oriented to person? Yes  Place? Yes   Time? Yes  Recall of three objects?  Yes  Can perform simple calculations? Yes  Displays appropriate judgment?Yes  Can read the correct time from a watch face?Yes   Advanced Directives have been discussed with the patient? Yes  List the Names  of Other Physician/Practitioners you currently use: 1.  optho--[facility] dentist--Dr Ward Chatters any recent Medical Services you may have received from other than [facility] providers in the past year (date may be approximate).  Immunization History  Administered Date(s) Administered  . Influenza Whole 03/28/2007, 04/01/2009, 02/18/2010  . Pneumococcal Polysaccharide 03/28/2007  . Td 12/20/2005    Screening Tests Health Maintenance  Topic Date Due  . Colonoscopy  09/19/1980  . Zostavax  09/20/1990  . Influenza Vaccine  03/08/2011  . Tetanus/tdap  12/21/2015  . Pneumococcal Polysaccharide Vaccine Age 68 And Over  Completed    All answers were reviewed with the patient and necessary referrals were made:  Garnet Koyanagi, DO   02/24/2011   History reviewed: allergies, current medications, past family history, past medical history, past social history, past surgical history and problem list  Review of Systems  Review of Systems  Constitutional: Negative for activity change, appetite change and fatigue.  HENT: Negative for hearing loss, congestion, tinnitus and ear discharge.  Dentist q93m Eyes: Negative for visual disturbance (see optho q1y -- vision corrected to 20/20 with glasses).  Respiratory: Negative for cough, chest tightness and shortness of breath.   Cardiovascular: Negative for chest pain, palpitations and leg swelling.  Gastrointestinal: Negative for abdominal pain, diarrhea, constipation and abdominal distention.  Genitourinary: Negative for urgency, frequency, decreased urine volume and difficulty urinating.  Musculoskeletal: Negative for back pain, arthralgias and gait problem.  Skin: Negative for color change, pallor and rash.  Neurological: Negative for dizziness, light-headedness, numbness and headaches.  Hematological: Negative for adenopathy. Does not bruise/bleed easily.  Psychiatric/Behavioral: Negative for suicidal ideas, confusion, sleep disturbance, self-injury,  dysphoric mood, decreased concentration and agitation.  Pt is able to read and write and can do all ADLs No risk for falling No abuse/ violence in home      Objective:     Vision by Snellen chart: optho  Body mass index is 29.64 kg/(m^2). BP 134/86  Pulse 70  Temp(Src) 98.6 F (37 C) (Oral)  Ht 5' 4.5" (1.638 m)  Wt 175 lb 6.4 oz (79.561 kg)  BMI 29.64 kg/m2  SpO2 98%  BP 134/86  Pulse 70  Temp(Src) 98.6 F (37 C) (Oral)  Ht 5' 4.5" (1.638 m)  Wt 175 lb 6.4 oz (79.561 kg)  BMI 29.64 kg/m2  SpO2 98% General appearance: alert, cooperative, appears stated age and no distress Head: Normocephalic, without obvious abnormality, atraumatic Eyes: conjunctivae/corneas clear. PERRL, EOM's intact. Fundi benign. Ears: normal TM's and external ear canals both ears Nose: Nares normal. Septum midline. Mucosa normal. No drainage or sinus tenderness. Throat: lips, mucosa, and tongue  normal; teeth and gums normal Neck: no adenopathy, no carotid bruit, no JVD, supple, symmetrical, trachea midline and thyroid not enlarged, symmetric, no tenderness/mass/nodules Back: symmetric, no curvature. ROM normal. No CVA tenderness. Lungs: clear to auscultation bilaterally Breasts: normal appearance, no masses or tenderness Heart: S1, S2 normal Abdomen: soft, non-tender; bowel sounds normal; no masses,  no organomegaly Extremities: extremities normal, atraumatic, no cyanosis or edema Pulses: 2+ and symmetric Skin: Skin color, texture, turgor normal. No rashes or lesions Lymph nodes: Cervical, supraclavicular, and axillary nodes normal. Neurologic: Alert and oriented X 3, normal strength and tone. Normal symmetric reflexes. Normal coordination and gait Psych--no depression, anxiety     Assessment:  1. Preventative care 2 hypertenison--- slightly elevated today-- increase losartan to 100 mg daily      Plan:     During the course of the visit the patient was educated and counseled about  appropriate screening and preventive services including:    Screening electrocardiogram  Bone densitometry screening  Advanced directives: has an advanced directive - a copy HAS NOT been provided.  Diet review for nutrition referral? Yes ____  Not Indicated _x___   Patient Instructions (the written plan) was given to the patient.  Medicare Attestation I have personally reviewed: The patient's medical and social history Their use of alcohol, tobacco or illicit drugs Their current medications and supplements The patient's functional ability including ADLs,fall risks, home safety risks, cognitive, and hearing and visual impairment Diet and physical activities Evidence for depression or mood disorders  The patient's weight, height, BMI, and visual acuity have been recorded in the chart.  I have made referrals, counseling, and provided education to the patient based on review of the above and I have provided the patient with a written personalized care plan for preventive services.     Garnet Koyanagi, DO   02/24/2011

## 2011-02-24 NOTE — Progress Notes (Signed)
Addended by: Ewing Schlein on: 02/24/2011 02:34 PM   Modules accepted: Orders

## 2011-03-02 ENCOUNTER — Encounter: Payer: Self-pay | Admitting: Family Medicine

## 2011-03-04 NOTE — Assessment & Plan Note (Signed)
Pt has has extensive workup in past with previous pcp--- tussonex refilled

## 2011-03-04 NOTE — Progress Notes (Signed)
  Subjective:    Patient ID: Deanna Shields, female    DOB: Sep 09, 1928, 77 y.o.   MRN: [id number]  HPI    Review of Systems     Objective:   Physical Exam        Assessment & Plan:    cchc  Chronic cough---- occassional tussonex needed

## 2011-03-11 ENCOUNTER — Encounter: Payer: Self-pay | Admitting: Family Medicine

## 2011-03-16 ENCOUNTER — Ambulatory Visit (INDEPENDENT_AMBULATORY_CARE_PROVIDER_SITE_OTHER): Payer: Medicare Other

## 2011-03-16 DIAGNOSIS — Z23 Encounter for immunization: Secondary | ICD-10-CM

## 2011-03-19 ENCOUNTER — Other Ambulatory Visit: Payer: Medicare Other

## 2011-03-24 ENCOUNTER — Ambulatory Visit (INDEPENDENT_AMBULATORY_CARE_PROVIDER_SITE_OTHER)
Admission: RE | Admit: 2011-03-24 | Discharge: 2011-03-24 | Disposition: A | Discharge status: Home / Self Care | Payer: Medicare Other | Source: Ambulatory Visit | Attending: Family Medicine | Admitting: Family Medicine

## 2011-03-24 DIAGNOSIS — Z780 Asymptomatic menopausal state: Secondary | ICD-10-CM

## 2011-03-24 DIAGNOSIS — M899 Disorder of bone, unspecified: Secondary | ICD-10-CM

## 2011-03-29 ENCOUNTER — Encounter: Payer: Self-pay | Admitting: Family Medicine

## 2011-03-29 ENCOUNTER — Ambulatory Visit: Payer: Medicare Other | Admitting: Family Medicine

## 2011-03-29 ENCOUNTER — Ambulatory Visit (INDEPENDENT_AMBULATORY_CARE_PROVIDER_SITE_OTHER): Payer: Medicare Other | Admitting: Family Medicine

## 2011-03-29 VITALS — BP 152/72 | HR 89 | Temp 98.7°F | Ht 68.0 in | Wt 153.8 lb

## 2011-03-29 DIAGNOSIS — L247 Irritant contact dermatitis due to plants, except food: Secondary | ICD-10-CM

## 2011-03-29 DIAGNOSIS — L255 Unspecified contact dermatitis due to plants, except food: Secondary | ICD-10-CM

## 2011-03-29 MED ORDER — PREDNISONE 10 MG PO TABS: ORAL_TABLET | ORAL | Status: DC

## 2011-03-29 MED ORDER — METHYLPREDNISOLONE ACETATE 80 MG/ML IJ SUSP
80.0000 mg | Freq: Once | INTRAMUSCULAR | Status: AC
  Administered 2011-03-29: 80 mg via INTRAMUSCULAR

## 2011-03-29 NOTE — Progress Notes (Signed)
  Subjective:    Patient ID: Deanna Shields, female    DOB: [birth year], 77 y.o.   MRN: [id number]  HPI  Pt here c/o rash on both arms x several days.   Pt was working in the yard over weekend.  Review of Systems as above   Objective:   Physical Exam  Constitutional: She is oriented to person, place, and time. She appears well-developed and well-nourished.  Neurological: She is alert and oriented to person, place, and time.  Skin:       + blisters both arms with errythema upper and low arms  Psychiatric: She has a normal mood and affect. Her behavior is normal. Judgment and thought content normal.          Assessment & Plan:  Contact derm secondary to poison ivy                      Depo medrol                             Prednisone taper                      otc antihistamine

## 2011-03-29 NOTE — Patient Instructions (Signed)

## 2011-04-06 ENCOUNTER — Encounter: Payer: Self-pay | Admitting: Family Medicine

## 2011-05-19 ENCOUNTER — Emergency Department (INDEPENDENT_AMBULATORY_CARE_PROVIDER_SITE_OTHER): Payer: Medicare Other

## 2011-05-19 ENCOUNTER — Encounter (HOSPITAL_BASED_OUTPATIENT_CLINIC_OR_DEPARTMENT_OTHER): Payer: Self-pay

## 2011-05-19 ENCOUNTER — Emergency Department (HOSPITAL_BASED_OUTPATIENT_CLINIC_OR_DEPARTMENT_OTHER)
Admission: EM | Admit: 2011-05-19 | Discharge: 2011-05-19 | Disposition: A | Discharge status: Home / Self Care | Payer: Medicare Other | Attending: Emergency Medicine | Admitting: Emergency Medicine

## 2011-05-19 DIAGNOSIS — I10 Essential (primary) hypertension: Secondary | ICD-10-CM | POA: Insufficient documentation

## 2011-05-19 DIAGNOSIS — M25559 Pain in unspecified hip: Secondary | ICD-10-CM | POA: Insufficient documentation

## 2011-05-19 DIAGNOSIS — M79609 Pain in unspecified limb: Secondary | ICD-10-CM

## 2011-05-19 DIAGNOSIS — M79606 Pain in leg, unspecified: Secondary | ICD-10-CM

## 2011-05-19 DIAGNOSIS — Z79899 Other long term (current) drug therapy: Secondary | ICD-10-CM | POA: Insufficient documentation

## 2011-05-19 MED ORDER — OXYCODONE-ACETAMINOPHEN 5-325 MG PO TABS
1.0000 | ORAL_TABLET | Freq: Once | ORAL | Status: AC
  Administered 2011-05-19: 1 via ORAL
  Filled 2011-05-19: qty 1

## 2011-05-19 MED ORDER — HYDROCODONE-ACETAMINOPHEN 5-500 MG PO TABS: 1.0000 | ORAL_TABLET | Freq: Four times a day (QID) | ORAL | Status: AC | PRN

## 2011-05-19 NOTE — ED Notes (Signed)
Pt reports pain in left thigh that started yesterday.  Pain is unrelieved after taking Aleve and she denies injury.

## 2011-05-19 NOTE — ED Notes (Signed)
Patient transported to X-ray via stretcher 

## 2011-05-19 NOTE — ED Provider Notes (Signed)
History     CSN: [id number] Arrival date & time: 05/19/2011  8:10 AM   First MD Initiated Contact with Patient 05/19/11 0813      Chief Complaint  Patient presents with  . Leg Pain    (Consider location/radiation/quality/duration/timing/severity/associated sxs/prior treatment) Patient is a 77 y.o. female presenting with leg pain. The history is provided by the patient.  Leg Pain  The incident occurred yesterday. The incident occurred at home. There was no injury mechanism. The pain is present in the left leg. The quality of the pain is described as aching and sharp. The pain is at a severity of 10/10. The pain is severe. The pain has been intermittent (Only hurts with walking or with standing) since onset. Associated symptoms include inability to bear weight. Pertinent negatives include no numbness, no muscle weakness, no loss of sensation and no tingling. She reports no foreign bodies present. The symptoms are aggravated by activity and bearing weight (Does not hurt with palpation). She has tried NSAIDs for the symptoms. The treatment provided no relief.    Past Medical History  Diagnosis Date  . Hypertension   . Allergy     rhinitis    Past Surgical History  Procedure Date  . Abdominal hysterectomy   . Hemorroidectomy   . Knee surgery     right  . Carpal tunnel release     Family History  Problem Relation Age of Onset  . Stroke Father 52    light stroke  . Heart disease Sister   . Coronary artery disease Neg Hx   . Diabetes Neg Hx   . Cancer Neg Hx     breast, colon, prostate    History  Substance Use Topics  . Smoking status: Never Smoker   . Smokeless tobacco: Never Used  . Alcohol Use: No    OB History    Grav Para Term Preterm Abortions TAB SAB Ect Mult Living                  Review of Systems  Constitutional: Negative for fever and fatigue.  Cardiovascular: Negative for leg swelling.  Gastrointestinal: Negative for nausea and vomiting.    Musculoskeletal: Negative for back pain and joint swelling.  Skin: Negative for color change, rash and wound.  Neurological: Negative for tingling, weakness and numbness.  All other systems reviewed and are negative.    Allergies  Codeine  Home Medications   Current Outpatient Rx  Name Route Sig Dispense Refill  . ASPIRIN 81 MG PO TABS Oral Take 81 mg by mouth daily.      [name] CALCIUM CARBONATE 600 MG PO TABS Oral Take 600 mg by mouth daily.      [name] HYDROCOD POLST-CHLORPHEN POLST 10-8 MG/5ML PO LQCR Oral Take 5 mLs by mouth at bedtime as needed. 70 mL 0  . VITAMIN D3 2000 UNITS PO CAPS Oral Take 2,000 Units by mouth daily.      . CYANOCOBALAMIN 1000 MCG/ML IJ SOLN Intramuscular Inject 1,000 mcg into the muscle every 30 (thirty) days.      . OMEGA-3 FATTY ACIDS 1000 MG PO CAPS Oral Take 1 g by mouth daily.      . FUROSEMIDE 40 MG PO TABS Oral Take 40 mg by mouth daily as needed.      [name] LOSARTAN POTASSIUM 100 MG PO TABS Oral Take 1 tablet (100 mg total) by mouth daily. 90 tablet 3  . MOMETASONE FUROATE 50 MCG/ACT NA SUSP Nasal Place 2  sprays into the nose daily.      . MULTIVITAMINS PO TABS Oral Take 1 tablet by mouth daily.      [name] OLMESARTAN MEDOXOMIL 20 MG PO TABS Oral Take 20 mg by mouth daily.      [name] PANTOPRAZOLE SODIUM 40 MG PO TBEC Oral Take 40 mg by mouth daily.      [name] PREDNISONE 10 MG PO TABS  3 po qd for 3 days then 2 po qd for 3 days the 1 po qd for 3 days 18 tablet 0    BP 173/72  Pulse 84  Temp(Src) 97.4 F (36.3 C) (Oral)  Resp 16  SpO2 98%  Physical Exam  Nursing note and vitals reviewed. Constitutional: She is oriented to person, place, and time. She appears well-developed and well-nourished. She appears distressed.  HENT:  Head: Normocephalic and atraumatic.  Eyes: EOM are normal. Pupils are equal, round, and reactive to light.  Cardiovascular: Normal rate, regular rhythm, normal heart sounds and intact distal pulses.  Exam reveals no friction rub.   No murmur  heard. Pulmonary/Chest: Effort normal and breath sounds normal. She has no wheezes. She has no rales.  Abdominal: Soft. Bowel sounds are normal. She exhibits no distension. There is no tenderness. There is no rebound and no guarding.  Musculoskeletal: Normal range of motion. She exhibits no tenderness.       Lumbar back: Normal.       No edema, no erythema. No palpable tenderness along the lateral left IT band. No crepitus and no swelling. Normal distal pulses on the left and less than 2 second capillary refill.  Neurological: She is alert and oriented to person, place, and time. No cranial nerve deficit.  Skin: Skin is warm and dry. No rash noted.  Psychiatric: She has a normal mood and affect. Her behavior is normal.    ED Course  Procedures (including critical care time)  Labs Reviewed - No data to display Dg Hip Complete Left  05/19/2011  *RADIOLOGY REPORT*  Clinical Data: Pain in left upper leg.  LEFT HIP - COMPLETE 2+ VIEW  Comparison: None.  Findings: Mild symmetric degenerative changes in the hips bilaterally.  SI joints are symmetric and unremarkable. No acute bony abnormality.  Specifically, no fracture, subluxation, or dislocation.  Soft tissues are intact.  IMPRESSION: Mild degenerative changes. No acute bony abnormality.  Original Report Authenticated By: Raelyn Number, M.D.   Dg Femur Left  05/19/2011  *RADIOLOGY REPORT*  Clinical Data: Left upper leg pain.  No injury.  LEFT FEMUR - 2 VIEW  Comparison: None.  Findings: No acute bony abnormality.  Specifically, no fracture, subluxation, or dislocation.  Soft tissues are intact.  Mild degenerative changes in the left hip and knee.  No knee joint effusion.  IMPRESSION: No acute bony abnormality.  Original Report Authenticated By: Raelyn Number, M.D.   US Venous Img Lower Unilateral Left  05/19/2011  *RADIOLOGY REPORT*  Clinical Data: pain with walking;; LEFT LEG PAIN.  LEFT LOWER EXTREMITY VENOUS DUPLEX ULTRASOUND  Technique:  Gray-scale sonography with compression, as well as color and duplex ultrasound, were performed to evaluate the deep venous system from the level of the common femoral vein through the popliteal and proximal calf veins.  Comparison: None  Findings:  Normal compressibility and normal Doppler signal within the common femoral, superficial femoral and popliteal veins, down to the proximal calf veins.  No grayscale filling defects to suggest DVT.  IMPRESSION: No evidence of left lower  extremity deep vein thrombosis.  Original Report Authenticated By: Raelyn Number, M.D.     No diagnosis found.    MDM  Patient with leg pain starting yesterday worsening today to the point where now she is unable to ambulate due to severe pain in her left lateral femur area. She denies any trauma, history of back pain, fever or, swelling or redness. On exam while resting in the bed she states the pain is at a minimum and is much more severe when she puts weight on it. She denies any hip or knee pain. On exam there is normal strength in the legs, and no crepitus or signs of cellulitis, good pulses and capillary refill, and no palpable back pain. Unable to elicit pain with palpation on exam. Unclear the etiology of this pain but will get plain films to look for pathologic fracture and ultrasound to rule out DVT.  9:15 AM   plain film and ultrasound within normal limits. No reason to explain patient's pain today however may be radicular in nature despite not having any back pain. Will treat her pain symptomatically and have her followup with her physician if the pain continues.     Blanchie Dessert, MD 05/19/11 [phone]

## 2011-05-19 NOTE — ED Notes (Signed)
MD at bedside. 

## 2011-05-19 NOTE — ED Notes (Signed)
Pt returned from ultrasound/radiology.  No change in pt assessment.

## 2011-05-19 NOTE — ED Notes (Signed)
MD at bedside speaking with pt and family

## 2011-06-10 ENCOUNTER — Other Ambulatory Visit: Payer: Self-pay | Admitting: *Deleted

## 2011-06-10 DIAGNOSIS — R05 Cough: Secondary | ICD-10-CM

## 2011-06-10 MED ORDER — HYDROCOD POLST-CHLORPHEN POLST 10-8 MG/5ML PO LQCR: 5.0000 mL | Freq: Every evening | ORAL | Status: DC | PRN

## 2011-06-10 NOTE — Telephone Encounter (Signed)
Last OV 03-29-11 last refill 02-24-11 70ML no refill for chlorpheniramine-HYDROcodone (TUSSIONEX  Pt left vm to have refilled per cough

## 2011-06-10 NOTE — Telephone Encounter (Signed)
Discussed with patient and the cough is the same.[name] Rx faxed    KP

## 2011-06-10 NOTE — Telephone Encounter (Signed)
As long as this is the same cough she always has ---and it is not productive and no fever etc ---ok to refill

## 2011-07-08 DIAGNOSIS — M76899 Other specified enthesopathies of unspecified lower limb, excluding foot: Secondary | ICD-10-CM | POA: Diagnosis not present

## 2011-11-02 DIAGNOSIS — H3530 Unspecified macular degeneration: Secondary | ICD-10-CM | POA: Diagnosis not present

## 2011-11-02 DIAGNOSIS — H33009 Unspecified retinal detachment with retinal break, unspecified eye: Secondary | ICD-10-CM | POA: Diagnosis not present

## 2011-11-03 DIAGNOSIS — H33319 Horseshoe tear of retina without detachment, unspecified eye: Secondary | ICD-10-CM | POA: Insufficient documentation

## 2011-11-10 ENCOUNTER — Telehealth: Payer: Self-pay | Admitting: Family Medicine

## 2011-11-10 DIAGNOSIS — R05 Cough: Secondary | ICD-10-CM

## 2011-11-10 NOTE — Telephone Encounter (Signed)
Please advise      KP 

## 2011-11-10 NOTE — Telephone Encounter (Signed)
Refill x1 

## 2011-11-10 NOTE — Telephone Encounter (Signed)
Pt would like rx for tussionex. [facility]

## 2011-11-11 MED ORDER — HYDROCOD POLST-CHLORPHEN POLST 10-8 MG/5ML PO LQCR: 5.0000 mL | Freq: Every evening | ORAL | Status: DC | PRN

## 2011-11-11 NOTE — Telephone Encounter (Signed)
Can you please re-send the RX to the [facility] on [address] Please

## 2011-11-29 ENCOUNTER — Ambulatory Visit (INDEPENDENT_AMBULATORY_CARE_PROVIDER_SITE_OTHER): Payer: Medicare Other | Admitting: Family Medicine

## 2011-11-29 ENCOUNTER — Encounter: Payer: Self-pay | Admitting: Family Medicine

## 2011-11-29 VITALS — BP 136/82 | HR 83 | Temp 98.1°F | Wt 164.0 lb

## 2011-11-29 DIAGNOSIS — Z9109 Other allergy status, other than to drugs and biological substances: Secondary | ICD-10-CM

## 2011-11-29 DIAGNOSIS — J40 Bronchitis, not specified as acute or chronic: Secondary | ICD-10-CM

## 2011-11-29 DIAGNOSIS — L247 Irritant contact dermatitis due to plants, except food: Secondary | ICD-10-CM

## 2011-11-29 DIAGNOSIS — R05 Cough: Secondary | ICD-10-CM

## 2011-11-29 DIAGNOSIS — L255 Unspecified contact dermatitis due to plants, except food: Secondary | ICD-10-CM | POA: Diagnosis not present

## 2011-11-29 MED ORDER — BECLOMETHASONE DIPROPIONATE 40 MCG/ACT IN AERS: 2.0000 | INHALATION_SPRAY | Freq: Two times a day (BID) | RESPIRATORY_TRACT | Status: DC

## 2011-11-29 MED ORDER — BECLOMETHASONE DIPROPIONATE 80 MCG/ACT NA AERS: 2.0000 | INHALATION_SPRAY | Freq: Every day | NASAL | Status: DC

## 2011-11-29 MED ORDER — FEXOFENADINE HCL 180 MG PO TABS: 180.0000 mg | ORAL_TABLET | Freq: Every day | ORAL | Status: DC

## 2011-11-29 MED ORDER — PREDNISONE 10 MG PO TABS: ORAL_TABLET | ORAL | Status: DC

## 2011-11-29 MED ORDER — HYDROCOD POLST-CHLORPHEN POLST 10-8 MG/5ML PO LQCR: 5.0000 mL | Freq: Every evening | ORAL | Status: DC | PRN

## 2011-11-29 MED ORDER — AZITHROMYCIN 250 MG PO TABS: ORAL_TABLET | ORAL | Status: AC

## 2011-11-29 NOTE — Progress Notes (Signed)
  Subjective:     Deanna Shields is a 78 y.o. female here for evaluation of a cough. Onset of symptoms was 3 weeks ago. Symptoms have been gradually worsening since that time. The cough is dry and is aggravated by exercise, infection and pollens. Associated symptoms include: shortness of breath and wheezing. Patient does not have a history of asthma. Patient does have a history of environmental allergens. Patient has not traveled recently. Patient does not have a history of smoking-- husband did smoke.  He was just diagnosed with lung cancer. Patient has not had a previous chest x-ray. Patient has not had a PPD done.  The following portions of the patient's history were reviewed and updated as appropriate: allergies, current medications, past family history, past medical history, past social history, past surgical history and problem list.  Review of Systems Pertinent items are noted in HPI.    Objective:    Oxygen saturation 97% on room air BP 136/82  Pulse 83  Temp 98.1 F (36.7 C) (Oral)  Wt 164 lb (74.39 kg)  SpO2 97% General appearance: alert, cooperative, appears stated age and no distress Nose: yellow discharge, mild congestion, no sinus tenderness Throat: lips, mucosa, and tongue normal; teeth and gums normal Neck: no adenopathy, no carotid bruit, no JVD, supple, symmetrical, trachea midline and thyroid not enlarged, symmetric, no tenderness/mass/nodules Lungs: wheezes posterior - left Heart: S1, S2 normal    Assessment:    Acute Bronchitis    Plan:    Antibiotics per medication orders. Antitussives per medication orders. Avoid exposure to tobacco smoke and fumes. B-agonist inhaler. Call if shortness of breath worsens, blood in sputum, change in character of cough, development of fever or chills, inability to maintain nutrition and hydration. Avoid exposure to tobacco smoke and fumes. Steroid inhaler as ordered. Trial of antihistamines. Trial of steroid nasal spray.

## 2011-11-29 NOTE — Patient Instructions (Addendum)

## 2011-12-24 ENCOUNTER — Other Ambulatory Visit: Payer: Self-pay | Admitting: Family Medicine

## 2011-12-24 DIAGNOSIS — R05 Cough: Secondary | ICD-10-CM

## 2011-12-24 MED ORDER — HYDROCOD POLST-CHLORPHEN POLST 10-8 MG/5ML PO LQCR: 5.0000 mL | Freq: Every evening | ORAL | Status: DC | PRN

## 2011-12-24 NOTE — Telephone Encounter (Signed)
Refill x1 

## 2011-12-24 NOTE — Telephone Encounter (Signed)
refill expired needs new rx for  Hydrocod Polst-Chlorphen Polst (Liquid CR) TUSSIONEX 10-8 MG/5ML Take 5 mLs by mouth at bedtime as needed , send to [facility] on [address]

## 2011-12-24 NOTE — Telephone Encounter (Signed)
Last seen 11/29/11 and Dx with Bronchitis. Please advise   KP

## 2011-12-24 NOTE — Telephone Encounter (Signed)
Rx faxed.    KP 

## 2011-12-27 ENCOUNTER — Encounter: Payer: Self-pay | Admitting: Family Medicine

## 2011-12-27 ENCOUNTER — Ambulatory Visit (HOSPITAL_BASED_OUTPATIENT_CLINIC_OR_DEPARTMENT_OTHER)
Admission: RE | Admit: 2011-12-27 | Discharge: 2011-12-27 | Disposition: A | Discharge status: Home / Self Care | Payer: Medicare Other | Source: Ambulatory Visit | Attending: Family Medicine | Admitting: Family Medicine

## 2011-12-27 ENCOUNTER — Ambulatory Visit (INDEPENDENT_AMBULATORY_CARE_PROVIDER_SITE_OTHER): Payer: Medicare Other | Admitting: Family Medicine

## 2011-12-27 VITALS — BP 130/74 | HR 88 | Temp 98.4°F | Wt 161.6 lb

## 2011-12-27 DIAGNOSIS — R05 Cough: Secondary | ICD-10-CM | POA: Diagnosis not present

## 2011-12-27 DIAGNOSIS — J40 Bronchitis, not specified as acute or chronic: Secondary | ICD-10-CM | POA: Diagnosis not present

## 2011-12-27 DIAGNOSIS — R0602 Shortness of breath: Secondary | ICD-10-CM | POA: Insufficient documentation

## 2011-12-27 DIAGNOSIS — R059 Cough, unspecified: Secondary | ICD-10-CM | POA: Insufficient documentation

## 2011-12-27 MED ORDER — ALBUTEROL SULFATE HFA 108 (90 BASE) MCG/ACT IN AERS: 2.0000 | INHALATION_SPRAY | Freq: Four times a day (QID) | RESPIRATORY_TRACT | Status: DC | PRN

## 2011-12-27 MED ORDER — PREDNISONE 10 MG PO TABS: ORAL_TABLET | ORAL | Status: DC

## 2011-12-27 MED ORDER — AZITHROMYCIN 250 MG PO TABS: ORAL_TABLET | ORAL | Status: AC

## 2011-12-27 MED ORDER — BECLOMETHASONE DIPROPIONATE 40 MCG/ACT IN AERS: 2.0000 | INHALATION_SPRAY | Freq: Two times a day (BID) | RESPIRATORY_TRACT | Status: DC

## 2011-12-27 NOTE — Progress Notes (Signed)
  Subjective:     Deanna Shields is a 78 y.o. female here for evaluation of a cough. Onset of symptoms was 7 days ago. Symptoms have been gradually worsening since that time. The cough is dry and is aggravated by infection, pollens and reclining position. Associated symptoms include: postnasal drip, shortness of breath and wheezing. Patient does not have a history of asthma. Patient does have a history of environmental allergens. Patient has not traveled recently. Patient does not have a history of smoking. Patient has not had a previous chest x-ray. Patient has not had a PPD done.  The following portions of the patient's history were reviewed and updated as appropriate: allergies, current medications, past family history, past medical history, past social history, past surgical history and problem list.  Review of Systems Pertinent items are noted in HPI.    Objective:    Oxygen saturation 95% on room air BP 130/74  Pulse 88  Temp 98.4 F (36.9 C) (Oral)  Wt 161 lb 9.6 oz (73.301 kg)  SpO2 95% General appearance: alert, cooperative, appears stated age and no distress Ears: normal TM's and external ear canals both ears Nose: Nares normal. Septum midline. Mucosa normal. No drainage or sinus tenderness. Throat: lips, mucosa, and tongue normal; teeth and gums normal Neck: mild anterior cervical adenopathy, supple, symmetrical, trachea midline and thyroid not enlarged, symmetric, no tenderness/mass/nodules Lungs: wheezes bibasilar Heart: S1, S2 normal    Assessment:    Acute Bronchitis    Plan:    Antibiotics per medication orders. Antitussives per medication orders. Avoid exposure to tobacco smoke and fumes. B-agonist inhaler. Call if shortness of breath worsens, blood in sputum, change in character of cough, development of fever or chills, inability to maintain nutrition and hydration. Avoid exposure to tobacco smoke and fumes. Chest x-ray. Steroid inhaler as ordered. cont  allegra

## 2011-12-27 NOTE — Patient Instructions (Addendum)

## 2012-01-10 ENCOUNTER — Encounter: Payer: Self-pay | Admitting: Family Medicine

## 2012-01-10 ENCOUNTER — Ambulatory Visit (INDEPENDENT_AMBULATORY_CARE_PROVIDER_SITE_OTHER): Payer: Medicare Other | Admitting: Family Medicine

## 2012-01-10 VITALS — BP 130/60 | HR 77 | Temp 98.3°F | Wt 157.0 lb

## 2012-01-10 DIAGNOSIS — R05 Cough: Secondary | ICD-10-CM | POA: Diagnosis not present

## 2012-01-10 MED ORDER — BECLOMETHASONE DIPROPIONATE 80 MCG/ACT IN AERS: 2.0000 | INHALATION_SPRAY | Freq: Two times a day (BID) | RESPIRATORY_TRACT | Status: DC

## 2012-01-10 MED ORDER — HYDROCOD POLST-CHLORPHEN POLST 10-8 MG/5ML PO LQCR: 5.0000 mL | Freq: Every evening | ORAL | Status: DC | PRN

## 2012-01-10 NOTE — Progress Notes (Signed)
  Subjective:    Patient ID: Deanna Shields, female    DOB: 1928/06/12, 78 y.o.   MRN: [id number]  HPI Pt here to f/u cough and bronchitis.  Pt states cough is much better with inhalers but it has not completely resolved. See last ov PFT to be done today   Review of Systems As above    Objective:   Physical Exam  Constitutional: She is oriented to person, place, and time. She appears well-developed and well-nourished.  Cardiovascular: Normal rate, regular rhythm and normal heart sounds.   Pulmonary/Chest: Effort normal. No respiratory distress. She has wheezes. She has no rales.  Neurological: She is alert and oriented to person, place, and time.  Psychiatric: She has a normal mood and affect. Her behavior is normal. Judgment and thought content normal.          Assessment & Plan:  Cough/ asthma--  Better with inhalers but not completely resolve--- increase Qvar to 80  2 puffs bid                               Consider pulmonary if no improvement-- pt requesting Dr Posey Rea

## 2012-01-31 ENCOUNTER — Telehealth: Payer: Self-pay | Admitting: Family Medicine

## 2012-01-31 ENCOUNTER — Other Ambulatory Visit: Payer: Self-pay | Admitting: Family Medicine

## 2012-01-31 DIAGNOSIS — J9801 Acute bronchospasm: Secondary | ICD-10-CM

## 2012-01-31 DIAGNOSIS — J45909 Unspecified asthma, uncomplicated: Secondary | ICD-10-CM

## 2012-01-31 NOTE — Telephone Encounter (Signed)
Yes-- we will refer to pulmonary

## 2012-01-31 NOTE — Telephone Encounter (Signed)
Please advise      KP 

## 2012-01-31 NOTE — Telephone Encounter (Signed)
Pt called stated she was advise by dr.lowne to call back if not better. Read ov notes f/u & disposition offered pt an appt., she refused stated dr.lowne advised her at last visit she would refer her to a specialist if no better. Cb# [phone]

## 2012-02-14 ENCOUNTER — Telehealth: Payer: Self-pay | Admitting: Internal Medicine

## 2012-02-14 ENCOUNTER — Ambulatory Visit (INDEPENDENT_AMBULATORY_CARE_PROVIDER_SITE_OTHER): Payer: Medicare Other | Admitting: Internal Medicine

## 2012-02-14 ENCOUNTER — Encounter: Payer: Self-pay | Admitting: Internal Medicine

## 2012-02-14 VITALS — BP 142/90 | HR 74 | Temp 98.3°F | Ht 64.0 in | Wt 162.4 lb

## 2012-02-14 DIAGNOSIS — R05 Cough: Secondary | ICD-10-CM

## 2012-02-14 MED ORDER — FLUTICASONE PROPIONATE 50 MCG/ACT NA SUSP: 2.0000 | Freq: Every day | NASAL | Status: DC

## 2012-02-14 MED ORDER — PANTOPRAZOLE SODIUM 40 MG PO TBEC: 40.0000 mg | DELAYED_RELEASE_TABLET | Freq: Every day | ORAL | Status: DC

## 2012-02-14 NOTE — Telephone Encounter (Signed)
Rx was sent to [facility].  LMOM for the pt to be made aware.

## 2012-02-14 NOTE — Patient Instructions (Addendum)
Cough is from sinus drainage, acid reflux, All of this is working together to cause cyclical cough/LPR cough  #Sinus drainage  - start and continue OTC netti pot daily  - start OTC antihistamine chlorpheniramine 4mg  tablet 1-2 at night; if this makes you too sleepy or dry cut down  - start nasal steroid generic fluticasone inhaler 2 squirts each nostril daily as advised (nurse will do script)  #Possible Acid Reflux  - take otc zegerid 20mg   1 capsule daily on empty stomach  - if too expensive, call me and we can do a prescription for protonix    - avoid colas, spices, cheeses, spirits, red meats, beer, chocolates, fried foods etc.,   - sleep with head end of bed elevated  - eat small frequent meals  - do not go to bed for 3 hours after last meal - STOP fish oil   #Cyclical cough  - please choose 2-3 days and observe complete voice rest - no talking or whispering  - at all times there  there is urge to cough, drink water or swallow or sip on throat lozenge  #Followup - I will see you in 4 weeks - cough score at followup - any problems call or come sooner - please ensure followup for husband [employer]

## 2012-02-14 NOTE — Progress Notes (Signed)
Subjective:    Patient ID: Deanna Shields, female    DOB: 1928-08-03, 78 y.o.   MRN: [id number]  HPI PCP is Garnet Koyanagi, DO  78 year old female. Body mass index is 27.88 kg/(m^2).  reports that she has never smoked. She has never used smokeless tobacco. Wife of my patient Mr MAURINA VILLAMAR who has mesothelioma  IOV 02/14/2012  Reports chronic cough for a "long time" x 4-5 years following a bout of pneumonia. Insidious onset. Gradually worse.  Usually made worse by URIs since then.   Short course of antibiotics and prednisone a few months ago helped resolve but only transiently for a few months. Total 2 courses prednisone (5-6 days); ;last one was 3 weeks ago. Tussionex prn at night also helps.   Cough intensity is moderate-severe (few weeks ago thought she had whooping cough). Associated feeling of Clears throat a lot +, tickle down throat +, sinus drainage +, GERD + with heartburn occassionaly +  Sinus History  - constant sinus drainage +. In 2013 this worse - seasonal variation + with spring and humiditiy but generally present throughout year - no Rx other than allegra prn which helps  GERD hx  - hx of gerd +  - hx of eso dilatation by Dr Sharlett Iles + - last 4 years ago ? 2009  - hx of hiatal hernia + - hx of fish oil intake +  - no Rx other than occasional TUMS prn   BP  - on losartan for few years (was on benicar prior) but no ACE inhibtor  Exposure hx  - no mold, no fumes  Airway Dz Hx  - no dx of asthma or copd   Investigations  - cxr July 2013 - mild hyperinfilation   - spirometry aug 2013 - suggestive of restriction     Dr Lorenza Cambridge Reflux Symptom Index (> 13-15 suggestive of LPR cough) 0 -> 5  =  none ->severe problem  Hoarseness of problem with voice 1  Clearing  Of Throat 4  Excess throat mucus or feeling of post nasal drip 4  Difficulty swallowing food, liquid or tablets 2  Cough after eating or lying down 4  Breathing difficulties or choking episodes 3    Troublesome or annoying cough 5  Sensation of something sticking in throat or lump in throat 4  Heartburn, chest pain, indigestion, or stomach acid coming up 2  TOTAL 29     Kouffman Reflux v Neurogenic Cough Differentiator Reflux Comments  Do you awaken from a sound sleep coughing violently?                            With trouble breathing? Yes   Do you have choking episodes when you cannot  Get enough air, gasping for air ?                 Do you usually cough when you lie down into  The bed, or when you just lie down to rest ?                          Yes   Do you usually cough after meals or eating?            Do you cough when (or after) you bend over?       GERD SCORE  2   Kouffman Reflux v Neurogenic Cough Differentiator Neurogenic  Do you more-or-less cough all day long? yes   Does change of temperature make you cough?    Does laughing or chuckling cause you to cough? yes   Do fumes (perfume, automobile fumes, burned  Toast, etc.,) cause you to cough ?         Does speaking, singing, or talking on the phone cause you to cough   ?               yes   Neurogenic/Airway score 3           Past Medical History  Diagnosis Date  . Hypertension   . Allergy     rhinitis     Family History  Problem Relation Age of Onset  . Stroke Father 24    light stroke  . Heart disease Sister   . Coronary artery disease Neg Hx   . Diabetes Neg Hx   . Cancer Neg Hx     breast, colon, prostate     History   Social History  . Marital Status: Married    Spouse Name: N/A    Number of Children: N/A  . Years of Education: N/A   Occupational History  . retired     Geneticist, molecular   Social History Main Topics  . Smoking status: Never Smoker   . Smokeless tobacco: Never Used  . Alcohol Use: No  . Drug Use: No  . Sexually Active: Not on file   Other Topics Concern  . Not on file   Social History Narrative   Patient doesn't get regular exercisemarried      Allergies  Allergen Reactions  . Codeine     REACTION: nausea and vomiting: can tolerate Tussionex     Outpatient Prescriptions Prior to Visit  Medication Sig Dispense Refill  . albuterol (VENTOLIN HFA) 108 (90 BASE) MCG/ACT inhaler Inhale 2 puffs into the lungs every 6 (six) hours as needed for wheezing.  1 Inhaler  2  . aspirin 81 MG tablet Take 81 mg by mouth daily.       . beclomethasone (QVAR) 80 MCG/ACT inhaler Inhale 2 puffs into the lungs 2 (two) times daily.  1 Inhaler  12  . Beclomethasone Dipropionate (QNASL) 80 MCG/ACT AERS Place 2 sprays into the nose daily.      . calcium carbonate (OS-CAL) 600 MG TABS Take 1,200 mg by mouth daily.       . chlorpheniramine-HYDROcodone (TUSSIONEX PENNKINETIC ER) 10-8 MG/5ML LQCR Take 5 mLs by mouth at bedtime as needed.  115 mL  0  . cholecalciferol (VITAMIN D) 1000 UNITS tablet Take 1,000 Units by mouth daily.        . cyanocobalamin (,VITAMIN B-12,) 1000 MCG/ML injection Inject 1,000 mcg into the muscle every 30 (thirty) days.        . fexofenadine (ALLEGRA) 180 MG tablet Take 1 tablet (180 mg total) by mouth daily.      . fish oil-omega-3 fatty acids 1000 MG capsule Take 1 g by mouth daily.       [name] losartan (COZAAR) 100 MG tablet Take 100 mg by mouth daily.        . mometasone (NASONEX) 50 MCG/ACT nasal spray Place 2 sprays into the nose daily.        . multivitamin (THERAGRAN) per tablet Take 1 tablet by mouth daily.        [name] olmesartan (BENICAR) 20 MG tablet Take 20 mg by mouth daily.        [name]  pantoprazole (PROTONIX) 40 MG tablet Take 40 mg by mouth daily.        . predniSONE (DELTASONE) 10 MG tablet 3 po qd for 3 days then 2 po qd for 3 days the 1 po qd for 3 days  18 tablet  0  . predniSONE (DELTASONE) 10 MG tablet 3 po qd for 3 days then 2 po qd for 3 days the 1 po qd for 3 days  18 tablet  0       Review of Systems  Constitutional: Negative.  Negative for fever, chills, diaphoresis, activity change, appetite change, fatigue  and unexpected weight change.  HENT: Positive for sneezing and postnasal drip. Negative for hearing loss, ear pain, nosebleeds, congestion, sore throat, facial swelling, rhinorrhea, mouth sores, trouble swallowing, neck pain, neck stiffness, dental problem, voice change, sinus pressure, tinnitus and ear discharge.   Eyes: Negative.  Negative for photophobia, discharge, itching and visual disturbance.  Respiratory: Positive for cough and wheezing. Negative for apnea, choking, chest tightness, shortness of breath and stridor.   Cardiovascular: Negative.  Negative for chest pain, palpitations and leg swelling.  Gastrointestinal: Negative.  Negative for nausea, vomiting, abdominal pain, constipation, blood in stool and abdominal distention.  Genitourinary: Negative.  Negative for dysuria, urgency, frequency, hematuria, flank pain, decreased urine volume and difficulty urinating.  Musculoskeletal: Negative.  Negative for myalgias, back pain, joint swelling, arthralgias and gait problem.  Skin: Negative.  Negative for color change, pallor and rash.  Neurological: Negative.  Negative for dizziness, tremors, seizures, syncope, speech difficulty, weakness, light-headedness, numbness and headaches.  Hematological: Negative.  Negative for adenopathy. Does not bruise/bleed easily.  Psychiatric/Behavioral: Negative.  Negative for confusion, disturbed wake/sleep cycle and agitation.       Objective:   Physical Exam  Vitals reviewed. Constitutional: She is oriented to person, place, and time. She appears well-developed and well-nourished. No distress.       Coughs and clears throat a lot  HENT:  Head: Normocephalic and atraumatic.  Right Ear: External ear normal.  Left Ear: External ear normal.  Mouth/Throat: Oropharynx is clear and moist. No oropharyngeal exudate.       Post nasal drip on uvula +  Eyes: Conjunctivae and EOM are normal. Pupils are equal, round, and reactive to light. Right eye exhibits no  discharge. Left eye exhibits no discharge. No scleral icterus.  Neck: Normal range of motion. Neck supple. No JVD present. No tracheal deviation present. No thyromegaly present.  Cardiovascular: Normal rate, regular rhythm, normal heart sounds and intact distal pulses.  Exam reveals no gallop and no friction rub.   No murmur heard. Pulmonary/Chest: Effort normal and breath sounds normal. No respiratory distress. She has no wheezes. She has no rales. She exhibits no tenderness.  Abdominal: Soft. Bowel sounds are normal. She exhibits no distension and no mass. There is no tenderness. There is no rebound and no guarding.  Musculoskeletal: Normal range of motion. She exhibits no edema and no tenderness.  Lymphadenopathy:    She has no cervical adenopathy.  Neurological: She is alert and oriented to person, place, and time. She has normal reflexes. No cranial nerve deficit. She exhibits normal muscle tone. Coordination normal.  Skin: Skin is warm and dry. No rash noted. She is not diaphoretic. No erythema. No pallor.  Psychiatric: She has a normal mood and affect. Her behavior is normal. Judgment and thought content normal.          Assessment & Plan:

## 2012-02-15 ENCOUNTER — Telehealth: Payer: Self-pay | Admitting: Internal Medicine

## 2012-02-15 NOTE — Assessment & Plan Note (Signed)
Cough is from sinus drainage, acid reflux, All of this is working together to cause cyclical cough/LPR cough  #Sinus drainage  - start and continue OTC netti pot daily  - start OTC antihistamine chlorpheniramine 4mg  tablet 1-2 at night; if this makes you too sleepy or dry cut down  - start nasal steroid generic fluticasone inhaler 2 squirts each nostril daily as advised (nurse will do script)  #Possible Acid Reflux  - take otc zegerid 20mg   1 capsule daily on empty stomach  - if too expensive, call me and we can do a prescription for protonix    - avoid colas, spices, cheeses, spirits, red meats, beer, chocolates, fried foods etc.,   - sleep with head end of bed elevated  - eat small frequent meals  - do not go to bed for 3 hours after last meal - STOP fish oil   #Cyclical cough  - please choose 2-3 days and observe complete voice rest - no talking or whispering  - at all times there  there is urge to cough, drink water or swallow or sip on throat lozenge  #Followup - I will see you in 4 weeks - cough score at followup - any problems call or come sooner - please ensure followup for husband [employer]

## 2012-02-15 NOTE — Telephone Encounter (Signed)
Forgot to tell her to stop fish oil until followup. Please let her know

## 2012-02-18 NOTE — Telephone Encounter (Signed)
LMOMTCB x 1 

## 2012-02-21 NOTE — Telephone Encounter (Signed)
Pt is aware. Roberto Hlavaty, CMA  

## 2012-02-21 NOTE — Telephone Encounter (Signed)
lmomtcb x1 

## 2012-02-21 NOTE — Telephone Encounter (Signed)
Returning all can be reached after 10:30a at [phone].Deanna Shields

## 2012-02-22 ENCOUNTER — Other Ambulatory Visit: Payer: Self-pay | Admitting: Family Medicine

## 2012-02-22 ENCOUNTER — Institutional Professional Consult (permissible substitution): Payer: Medicare Other | Admitting: Pulmonary Disease

## 2012-02-22 DIAGNOSIS — R05 Cough: Secondary | ICD-10-CM

## 2012-02-22 MED ORDER — HYDROCOD POLST-CHLORPHEN POLST 10-8 MG/5ML PO LQCR: 5.0000 mL | Freq: Every evening | ORAL | Status: DC | PRN

## 2012-02-22 MED ORDER — LOSARTAN POTASSIUM 100 MG PO TABS: 100.0000 mg | ORAL_TABLET | Freq: Every day | ORAL | Status: DC

## 2012-02-22 NOTE — Telephone Encounter (Signed)
Last had Tussionex filled in June and is requesting refill.  Also needs her losartan, but it appears her years worth of refills is up this month.  Would you like her to be seen for a f/u on her bp or ok to refill?

## 2012-02-22 NOTE — Telephone Encounter (Signed)
Refill until dec

## 2012-02-22 NOTE — Telephone Encounter (Signed)
Called patient Deanna Shields to call the office and schedule CPE

## 2012-02-22 NOTE — Telephone Encounter (Signed)
Please offer this patient a CPE so she can get her Rx refilled --    KP

## 2012-02-22 NOTE — Telephone Encounter (Signed)
Please advise      KP 

## 2012-02-22 NOTE — Telephone Encounter (Signed)
She needs ov for cpe and then can refill until cpe

## 2012-02-22 NOTE — Telephone Encounter (Signed)
Patient returned call from office. She is scheduled for next available AM CPE on 05/10/12. She requests that we refill her meds asap

## 2012-02-22 NOTE — Telephone Encounter (Signed)
Patient states she needs [place] to send her Losartan 100mg  tabs to [facility]. She also needs another rx for tussionex sent to [facility] on [address].

## 2012-02-24 ENCOUNTER — Institutional Professional Consult (permissible substitution): Payer: Medicare Other | Admitting: Internal Medicine

## 2012-03-01 ENCOUNTER — Ambulatory Visit (INDEPENDENT_AMBULATORY_CARE_PROVIDER_SITE_OTHER): Payer: Medicare Other | Admitting: *Deleted

## 2012-03-01 DIAGNOSIS — Z23 Encounter for immunization: Secondary | ICD-10-CM | POA: Diagnosis not present

## 2012-03-02 ENCOUNTER — Ambulatory Visit: Payer: Medicare Other

## 2012-03-21 ENCOUNTER — Ambulatory Visit (INDEPENDENT_AMBULATORY_CARE_PROVIDER_SITE_OTHER): Payer: Medicare Other | Admitting: Internal Medicine

## 2012-03-21 ENCOUNTER — Encounter: Payer: Self-pay | Admitting: Internal Medicine

## 2012-03-21 VITALS — BP 128/70 | HR 72 | Temp 98.2°F | Ht 64.0 in | Wt 183.8 lb

## 2012-03-21 DIAGNOSIS — R942 Abnormal results of pulmonary function studies: Secondary | ICD-10-CM

## 2012-03-21 DIAGNOSIS — R05 Cough: Secondary | ICD-10-CM

## 2012-03-21 DIAGNOSIS — R059 Cough, unspecified: Secondary | ICD-10-CM

## 2012-03-21 NOTE — Assessment & Plan Note (Signed)
Glad your cough is  Significantly better However, you have residual cough that is unexplained and could reflect cough variant asthma due to high exhaled nitric oxide test So, have methacholine challenge test (for asthma) and also CT scan chest (rule out fibrosis lungs) I will call you with above results to decide next step If results are all negative, I will likely advise speech therapy and gabapentin therapy to cool the nerve fibers of cough mechanism

## 2012-03-21 NOTE — Progress Notes (Signed)
Subjective:    Patient ID: Deanna Shields, female    DOB: Jul 17, 1928, 78 y.o.   MRN: [id number]  HPI PCP is Deanna Koyanagi, DO  78 year old female. Body mass index is 27.88 kg/(m^2).  reports that she has never smoked. She has never used smokeless tobacco. Wife of my patient Mr Deanna Shields who has mesothelioma  IOV 02/14/2012  Reports chronic cough for a "long time" x 4-5 years following a bout of pneumonia. Insidious onset. Gradually worse.  Usually made worse by URIs since then.   Short course of antibiotics and prednisone a few months ago helped resolve but only transiently for a few months. Total 2 courses prednisone (5-6 days); ;last one was 3 weeks ago. Tussionex prn at night also helps.   Cough intensity is moderate-severe (few weeks ago thought she had whooping cough). Associated feeling of Clears throat a lot +, tickle down throat +, sinus drainage +, GERD + with heartburn occassionaly +. RSI coug score 29 and and c/w LPR cough  Sinus History  - constant sinus drainage +. In 2013 this worse - seasonal variation + with spring and humiditiy but generally present throughout year - no Rx other than allegra prn which helps  GERD hx  - hx of gerd +  - hx of eso dilatation by Dr Sharlett Iles + - last 4 years ago ? 2009  - hx of hiatal hernia + - hx of fish oil intake +  - no Rx other than occasional TUMS prn   BP  - on losartan for few years (was on benicar prior) but no ACE inhibtor  Exposure hx  - no mold, no fumes  Airway Dz Hx  - no dx of asthma or copd   Investigations  - cxr July 2013 - mild hyperinfilation   - spirometry aug 2013 - suggestive of restriction   REC Cough is from sinus drainage, acid reflux,  All of this is working together to cause cyclical cough/LPR cough  #Sinus drainage  - start and continue OTC netti pot daily  - start OTC antihistamine chlorpheniramine 4mg  tablet 1-2 at night; if this makes you too sleepy or dry cut down  - start nasal steroid  generic fluticasone inhaler 2 squirts each nostril daily as advised (nurse will do script)  #Possible Acid Reflux  - take otc zegerid 20mg  1 capsule daily on empty stomach  - if too expensive, call me and we can do a prescription for protonix  - avoid colas, spices, cheeses, spirits, red meats, beer, chocolates, fried foods etc.,  - sleep with head end of bed elevated  - eat small frequent meals  - do not go to bed for 3 hours after last meal  - STOP fish oil  #Cyclical cough  - please choose 2-3 days and observe complete voice rest - no talking or whispering  - at all times there there is urge to cough, drink water or swallow or sip on throat lozenge  #Followup  - I will see you in 4 weeks  - cough score at followup  - any problems call or come sooner  - please ensure followup for husband Deanna Shields   OV 03/21/2012  Cough significantly better. Subjectively feels 75%  Better but on cough socre 50% better. She is following sinus (antihistamine, netti pot daily and nasal steroid) an dGERD measures (protonix and diet). She did not do voice rest as fully advised but tried it. With this she has  50-75% better. RSI cough score down to 14 (From 29). However, residual cough +. Is dry. Can happen anytime. Gag is only occasional. Still with occasional tickle in throat.    Exhaled Nitric OXidue is 24 and right at [facility] for diagnosis of eosinophilic asthma  Past, Family, Social reviewed: no change since last visit     Dr Lorenza Cambridge Reflux Symptom Index (> 13-15 suggestive of LPR cough) 02/14/12 03/21/2012   Hoarseness of problem with voice 1 1  Clearing  Of Throat 4 2  Excess throat mucus or feeling of post nasal drip 4 2  Difficulty swallowing food, liquid or tablets 2 3  Cough after eating or lying down 4 0  Breathing difficulties or choking episodes 3 1  Troublesome or annoying cough 5 2  Sensation of something sticking in throat or lump in throat 4 3  Heartburn, chest pain, indigestion,  or stomach acid coming up 2 3  TOTAL 29 17     Kouffman Reflux v Neurogenic Cough Differentiator Reflux 02/14/12 03/21/2012   Do you awaken from a sound sleep coughing violently?                            With trouble breathing? Yes no  Do you have choking episodes when you cannot  Get enough air, gasping for air ?                 Do you usually cough when you lie down into  The bed, or when you just lie down to rest ?                          Yes yes  Do you usually cough after meals or eating?          sometimes  Do you cough when (or after) you bend over?       GERD SCORE  2 1.5  Kouffman Reflux v Neurogenic Cough Differentiator Neurogenic   Do you more-or-less cough all day long? yes no  Does change of temperature make you cough?    Does laughing or chuckling cause you to cough? yes n0  Do fumes (perfume, automobile fumes, burned  Toast, etc.,) cause you to cough ?         Does speaking, singing, or talking on the phone cause you to cough   ?               yes yes  Neurogenic/Airway score 3 1        Review of Systems  Constitutional: Negative for fever and unexpected weight change.  HENT: Negative for ear pain, nosebleeds, congestion, sore throat, rhinorrhea, sneezing, trouble swallowing, dental problem, postnasal drip and sinus pressure.   Eyes: Negative for redness and itching.  Respiratory: Negative for cough, chest tightness, shortness of breath and wheezing.   Cardiovascular: Negative for palpitations and leg swelling.  Gastrointestinal: Negative for nausea and vomiting.  Genitourinary: Negative for dysuria.  Musculoskeletal: Negative for joint swelling.  Skin: Negative for rash.  Neurological: Negative for headaches.  Hematological: Does not bruise/bleed easily.  Psychiatric/Behavioral: Negative for dysphoric mood. The patient is not nervous/anxious.        Objective:   Physical Exam Vitals reviewed. Constitutional: She is oriented to person, place, and  time. She appears well-developed and well-nourished. No distress.       Coughs and clearing - RESOLVED  HENT:  Head:  Normocephalic and atraumatic.  Right Ear: External ear normal.  Left Ear: External ear normal.  Mouth/Throat: Oropharynx is clear and moist. No oropharyngeal exudate.       Post nasal drip on uvula  RESOLVED Eyes: Conjunctivae and EOM are normal. Pupils are equal, round, and reactive to light. Right eye exhibits no discharge. Left eye exhibits no discharge. No scleral icterus.  Neck: Normal range of motion. Neck supple. No JVD present. No tracheal deviation present. No thyromegaly present.  Cardiovascular: Normal rate, regular rhythm, normal heart sounds and intact distal pulses.  Exam reveals no gallop and no friction rub.   No murmur heard. Pulmonary/Chest: Effort normal and breath sounds normal. No respiratory distress. She has no wheezes. She has no rales. She exhibits no tenderness.  Abdominal: Soft. Bowel sounds are normal. She exhibits no distension and no mass. There is no tenderness. There is no rebound and no guarding.  Musculoskeletal: Normal range of motion. She exhibits no edema and no tenderness.  Lymphadenopathy:    She has no cervical adenopathy.  Neurological: She is alert and oriented to person, place, and time. She has normal reflexes. No cranial nerve deficit. She exhibits normal muscle tone. Coordination normal.  Skin: Skin is warm and dry. No rash noted. She is not diaphoretic. No erythema. No pallor.  Psychiatric: She has a normal mood and affect. Her behavior is normal. Judgment and thought content normal.          Assessment & Plan:

## 2012-03-21 NOTE — Patient Instructions (Addendum)
Glad your cough is  Significantly better However, you have residual cough that is unexplained To sort out residual unexplained cough please have exhalend nitric oxide test today Also, have methacholine challenge test (for asthma) and CT scan chest (rule out fibrosis lungs) I will call you with above results to decide next step If results are all negative, I will likely advise speech therapy and gabapentin therapy to cool the nerve fibers of cough mechanism

## 2012-03-27 ENCOUNTER — Ambulatory Visit (HOSPITAL_COMMUNITY)
Admission: RE | Admit: 2012-03-27 | Discharge: 2012-03-27 | Disposition: A | Discharge status: Home / Self Care | Payer: Medicare Other | Source: Ambulatory Visit | Attending: Internal Medicine | Admitting: Internal Medicine

## 2012-03-27 ENCOUNTER — Other Ambulatory Visit: Payer: Medicare Other

## 2012-03-27 ENCOUNTER — Other Ambulatory Visit (HOSPITAL_COMMUNITY): Payer: Medicare Other

## 2012-03-27 DIAGNOSIS — R05 Cough: Secondary | ICD-10-CM | POA: Insufficient documentation

## 2012-03-27 DIAGNOSIS — R059 Cough, unspecified: Secondary | ICD-10-CM | POA: Insufficient documentation

## 2012-03-27 DIAGNOSIS — R942 Abnormal results of pulmonary function studies: Secondary | ICD-10-CM

## 2012-03-27 DIAGNOSIS — J984 Other disorders of lung: Secondary | ICD-10-CM | POA: Diagnosis not present

## 2012-03-27 DIAGNOSIS — R918 Other nonspecific abnormal finding of lung field: Secondary | ICD-10-CM | POA: Diagnosis not present

## 2012-03-27 LAB — PULMONARY FUNCTION TEST

## 2012-03-27 MED ORDER — METHACHOLINE 1 MG/ML NEB SOLN
2.0000 mL | Freq: Once | RESPIRATORY_TRACT | Status: AC
  Administered 2012-03-27: 2 mg via RESPIRATORY_TRACT

## 2012-03-27 MED ORDER — ALBUTEROL SULFATE (5 MG/ML) 0.5% IN NEBU
2.5000 mg | INHALATION_SOLUTION | Freq: Once | RESPIRATORY_TRACT | Status: AC
  Administered 2012-03-27: 2.5 mg via RESPIRATORY_TRACT

## 2012-03-27 MED ORDER — METHACHOLINE 4 MG/ML NEB SOLN
2.0000 mL | Freq: Once | RESPIRATORY_TRACT | Status: AC
  Administered 2012-03-27: 8 mg via RESPIRATORY_TRACT

## 2012-03-27 MED ORDER — METHACHOLINE 0.0625 MG/ML NEB SOLN
2.0000 mL | Freq: Once | RESPIRATORY_TRACT | Status: AC
  Administered 2012-03-27: 0.125 mg via RESPIRATORY_TRACT

## 2012-03-27 MED ORDER — METHACHOLINE 0.25 MG/ML NEB SOLN
2.0000 mL | Freq: Once | RESPIRATORY_TRACT | Status: AC
  Administered 2012-03-27: 0.5 mg via RESPIRATORY_TRACT

## 2012-03-27 MED ORDER — METHACHOLINE 16 MG/ML NEB SOLN
2.0000 mL | Freq: Once | RESPIRATORY_TRACT | Status: AC
  Administered 2012-03-27: 32 mg via RESPIRATORY_TRACT

## 2012-03-27 MED ORDER — SODIUM CHLORIDE 0.9 % IN NEBU
3.0000 mL | INHALATION_SOLUTION | Freq: Once | RESPIRATORY_TRACT | Status: AC
  Administered 2012-03-27: 3 mL via RESPIRATORY_TRACT

## 2012-03-28 ENCOUNTER — Encounter: Payer: Self-pay | Admitting: Internal Medicine

## 2012-04-03 ENCOUNTER — Encounter: Payer: Self-pay | Admitting: *Deleted

## 2012-04-03 ENCOUNTER — Telehealth: Payer: Self-pay | Admitting: Internal Medicine

## 2012-04-03 DIAGNOSIS — J387 Other diseases of larynx: Secondary | ICD-10-CM

## 2012-04-03 DIAGNOSIS — R911 Solitary pulmonary nodule: Secondary | ICD-10-CM

## 2012-04-03 DIAGNOSIS — R05 Cough: Secondary | ICD-10-CM

## 2012-04-03 NOTE — Telephone Encounter (Signed)
Pt had Methacholine challenge and CT Chest done on Monday 03-27-12; no results in EPIC from MR; pt is eager to know the results. Please advise. Thanks

## 2012-04-03 NOTE — Telephone Encounter (Signed)
Line busy. WCB .

## 2012-04-03 NOTE — Telephone Encounter (Signed)
Ct chest 03/27/12 shows no lung fibrosis or changes to explain cough but has very tiny nodules that need ct scan in 1 year   Methacholine challenge 10/21/3 is negative for cough  This means residual cough is from Irritable larynx or LPR cough - a concept that was explained to her. So she  Needs  A) Take gabapentin 300mg  once daily x 3 days, then 300mg  twice daily x 3 days, then 300mg  three times daily to continue. If this makes you too sleepy or drowsy call us and we will cut your medication dosing down - please send  B) refer neuro rehab to Mr CArl Schinke speechtherapy - I ordered  C) fu 6-8 weeks with cough score at ful - please arrrange  D) Ct chest wo contrast in 1 year - I ordered  Thanks MR

## 2012-04-04 MED ORDER — GABAPENTIN 300 MG PO CAPS: ORAL_CAPSULE | ORAL | Status: DC

## 2012-04-04 NOTE — Telephone Encounter (Signed)
Lm w/ spouse for pt tcb x1

## 2012-04-04 NOTE — Telephone Encounter (Signed)
Will call her home # after 12 PM.

## 2012-04-04 NOTE — Telephone Encounter (Signed)
Returning call says she's at work now can be reached at home after 12p at [phone].Deanna Shields

## 2012-04-04 NOTE — Telephone Encounter (Signed)
I spoke with pt and is aware of results. She voiced her understanding. Also aware of MR recs. She agree'd to everything he has planned for her. She asked to send gabapentin RX into [facility] pharmacy. I have sent this x 1 month supply. She verbalized understanding of the directions for this medication. Also aware [facility] will call regarding referral. Nothing further was needed

## 2012-04-18 DIAGNOSIS — L820 Inflamed seborrheic keratosis: Secondary | ICD-10-CM | POA: Diagnosis not present

## 2012-04-18 DIAGNOSIS — I781 Nevus, non-neoplastic: Secondary | ICD-10-CM | POA: Diagnosis not present

## 2012-04-18 DIAGNOSIS — D235 Other benign neoplasm of skin of trunk: Secondary | ICD-10-CM | POA: Diagnosis not present

## 2012-04-18 DIAGNOSIS — L570 Actinic keratosis: Secondary | ICD-10-CM | POA: Diagnosis not present

## 2012-04-19 ENCOUNTER — Ambulatory Visit: Payer: Medicare Other

## 2012-04-25 ENCOUNTER — Other Ambulatory Visit: Payer: Self-pay | Admitting: Family Medicine

## 2012-04-25 DIAGNOSIS — R05 Cough: Secondary | ICD-10-CM

## 2012-04-25 MED ORDER — HYDROCOD POLST-CHLORPHEN POLST 10-8 MG/5ML PO LQCR: 5.0000 mL | Freq: Every evening | ORAL | Status: DC | PRN

## 2012-04-25 NOTE — Addendum Note (Signed)
Addended by: Ewing Schlein on: 04/25/2012 01:08 PM   Modules accepted: Orders

## 2012-04-25 NOTE — Telephone Encounter (Signed)
Refill x1 

## 2012-04-25 NOTE — Telephone Encounter (Signed)
called to reschedule her CPE from 11.29 to next availabe--stated she is almost out of tussinex & would like to have it sent to [facility] on [address] if approved Cb# [phone]

## 2012-04-25 NOTE — Telephone Encounter (Signed)
Patient aware. Rx being faxed     KP

## 2012-04-25 NOTE — Telephone Encounter (Signed)
Please advise      KP 

## 2012-04-26 ENCOUNTER — Ambulatory Visit: Payer: Medicare Other | Admitting: Family Medicine

## 2012-05-04 ENCOUNTER — Other Ambulatory Visit: Payer: Self-pay | Admitting: Family Medicine

## 2012-05-05 ENCOUNTER — Ambulatory Visit: Payer: Medicare Other | Admitting: Family Medicine

## 2012-05-10 ENCOUNTER — Ambulatory Visit: Payer: Medicare Other | Admitting: Family Medicine

## 2012-05-18 DIAGNOSIS — Z79899 Other long term (current) drug therapy: Secondary | ICD-10-CM | POA: Diagnosis not present

## 2012-05-18 DIAGNOSIS — I10 Essential (primary) hypertension: Secondary | ICD-10-CM | POA: Diagnosis not present

## 2012-05-18 DIAGNOSIS — R079 Chest pain, unspecified: Secondary | ICD-10-CM | POA: Diagnosis not present

## 2012-05-18 DIAGNOSIS — E782 Mixed hyperlipidemia: Secondary | ICD-10-CM | POA: Diagnosis not present

## 2012-05-19 ENCOUNTER — Other Ambulatory Visit (HOSPITAL_COMMUNITY): Payer: Self-pay | Admitting: Cardiovascular Disease

## 2012-05-19 ENCOUNTER — Ambulatory Visit: Payer: Medicare Other | Admitting: Internal Medicine

## 2012-05-19 DIAGNOSIS — R072 Precordial pain: Secondary | ICD-10-CM

## 2012-06-01 ENCOUNTER — Ambulatory Visit (HOSPITAL_COMMUNITY)
Admission: RE | Admit: 2012-06-01 | Discharge: 2012-06-01 | Disposition: A | Discharge status: Home / Self Care | Payer: Medicare Other | Source: Ambulatory Visit | Attending: Cardiovascular Disease | Admitting: Cardiovascular Disease

## 2012-06-01 DIAGNOSIS — R079 Chest pain, unspecified: Secondary | ICD-10-CM | POA: Diagnosis not present

## 2012-06-01 DIAGNOSIS — R072 Precordial pain: Secondary | ICD-10-CM | POA: Insufficient documentation

## 2012-06-01 DIAGNOSIS — I10 Essential (primary) hypertension: Secondary | ICD-10-CM | POA: Diagnosis not present

## 2012-06-01 HISTORY — PX: NM MYOVIEW LTD: HXRAD82

## 2012-06-01 MED ORDER — TECHNETIUM TC 99M SESTAMIBI GENERIC - CARDIOLITE
31.0000 | Freq: Once | INTRAVENOUS | Status: AC | PRN
  Administered 2012-06-01: 31 via INTRAVENOUS

## 2012-06-01 MED ORDER — TECHNETIUM TC 99M SESTAMIBI GENERIC - CARDIOLITE
11.0000 | Freq: Once | INTRAVENOUS | Status: AC | PRN
  Administered 2012-06-01: 11 via INTRAVENOUS

## 2012-06-01 MED ORDER — REGADENOSON 0.4 MG/5ML IV SOLN
0.4000 mg | Freq: Once | INTRAVENOUS | Status: AC
  Administered 2012-06-01: 0.4 mg via INTRAVENOUS

## 2012-06-01 NOTE — Procedures (Addendum)
[facility] [address] [place] [address] [phone]  Cardiology Nuclear Med Study  Deanna Shields is a 78 y.o. female     MRN : [id number]     DOB: [birth year]  Procedure Date: 06/01/2012  Nuclear Med Background Indication for Stress Test:  Evaluation for Ischemia History:  No prior cardiac history Cardiac Risk Factors: Hypertension, Lipids and Overweight  Symptoms:  Chest Pain, Dizziness and SOB   Nuclear Pre-Procedure Caffeine/Decaff Intake:  7:00pm NPO After: 5:00am   IV Site: R Antecubital  IV 0.9% NS with Angio Cath:  22g  Chest Size (in):  n/a IV Started by: Otho Perl, CNMT  Height: 5\' 4"  (1.626 m)  Cup Size: B  BMI:  Body mass index is 31.41 kg/(m^2). Weight:  183 lb (83.008 kg)   Tech Comments:  n/a    Nuclear Med Study 1 or 2 day study: 1 day  Stress Test Type:  [place]  Order Authorizing Provider:  Quay Burow, MD   Resting Radionuclide: Technetium 12m Sestamibi  Resting Radionuclide Dose: 11.0 mCi   Stress Radionuclide:  Technetium 54m Sestamibi  Stress Radionuclide Dose: 31.0 mCi           Stress Protocol Rest HR: 75 Stress HR: 100  Rest BP: 191/96 Stress BP: 165/80  Exercise Time (min): n/a METS: n/a   Predicted Max HR: 139 bpm % Max HR: 71.94 bpm Rate Pressure Product: 19100   Dose of Adenosine (mg):  n/a Dose of Lexiscan: 0.4 mg  Dose of Atropine (mg): n/a Dose of Dobutamine: n/a mcg/kg/min (at max HR)  Stress Test Technologist: Leane Para, CCT Nuclear Technologist: Imagene Riches, CNMT   Rest Procedure:  Myocardial perfusion imaging was performed at rest 45 minutes following the intravenous administration of Technetium 42m Sestamibi. Stress Procedure:  The patient received IV Lexiscan 0.4 mg over 15-seconds.  Technetium 33m Sestamibi injected at 30-seconds.  There were no significant changes with Lexiscan.  Quantitative spect images were obtained after a 45 minute  delay.  Transient Ischemic Dilatation (Normal <1.22):  1.06 Lung/Heart Ratio (Normal <0.45):  0.26 QGS EDV:  57 ml QGS ESV:  17 ml LV Ejection Fraction: 71%  Signed by      Rest ECG: NSR - Normal EKG  Stress ECG: No significant change from baseline ECG  QPS Raw Data Images:  Normal; no motion artifact; normal heart/lung ratio. Stress Images:  Normal homogeneous uptake in all areas of the myocardium. Rest Images:  Normal homogeneous uptake in all areas of the myocardium. Subtraction (SDS):  No evidence of ischemia.  Impression Exercise Capacity:  Lexiscan with no exercise. BP Response:  Normal blood pressure response. Clinical Symptoms:  No significant symptoms noted. ECG Impression:  No significant ST segment change suggestive of ischemia. Comparison with Prior Nuclear Study: No previous nuclear study performed  Overall Impression:  Normal stress nuclear study.  LV Wall Motion:  NL LV Function; NL Wall Motion   [name], MD  06/02/2012 6:01 PM

## 2012-06-19 ENCOUNTER — Telehealth: Payer: Self-pay | Admitting: Family Medicine

## 2012-06-19 DIAGNOSIS — R05 Cough: Secondary | ICD-10-CM

## 2012-06-19 MED ORDER — HYDROCOD POLST-CHLORPHEN POLST 10-8 MG/5ML PO LQCR: 5.0000 mL | Freq: Every evening | ORAL | Status: DC | PRN

## 2012-06-19 NOTE — Telephone Encounter (Signed)
Refill x1 

## 2012-06-19 NOTE — Telephone Encounter (Signed)
Rx faxed.    KP 

## 2012-06-19 NOTE — Telephone Encounter (Signed)
Patient states she needs refill on tussionex. Her pharmacy told her she would have to call her MD bc there are no refills. Patient uses [facility] on [address].

## 2012-06-19 NOTE — Telephone Encounter (Signed)
Last seen 01/10/12 and filled 04/25/12 #[id number]. Please advise      KP

## 2012-06-29 DIAGNOSIS — Z79899 Other long term (current) drug therapy: Secondary | ICD-10-CM | POA: Diagnosis not present

## 2012-07-13 DIAGNOSIS — I10 Essential (primary) hypertension: Secondary | ICD-10-CM | POA: Diagnosis not present

## 2012-07-19 ENCOUNTER — Ambulatory Visit: Payer: Medicare Other | Admitting: Family Medicine

## 2012-07-31 ENCOUNTER — Encounter: Payer: Self-pay | Admitting: Gastroenterology

## 2012-07-31 ENCOUNTER — Ambulatory Visit (INDEPENDENT_AMBULATORY_CARE_PROVIDER_SITE_OTHER): Payer: Medicare Other | Admitting: Family Medicine

## 2012-07-31 ENCOUNTER — Encounter: Payer: Self-pay | Admitting: Family Medicine

## 2012-07-31 VITALS — BP 122/72 | HR 92 | Temp 98.4°F | Wt 175.2 lb

## 2012-07-31 DIAGNOSIS — H9190 Unspecified hearing loss, unspecified ear: Secondary | ICD-10-CM

## 2012-07-31 DIAGNOSIS — R05 Cough: Secondary | ICD-10-CM | POA: Diagnosis not present

## 2012-07-31 DIAGNOSIS — H9191 Unspecified hearing loss, right ear: Secondary | ICD-10-CM

## 2012-07-31 MED ORDER — HYDROCOD POLST-CHLORPHEN POLST 10-8 MG/5ML PO LQCR: 5.0000 mL | Freq: Every evening | ORAL | Status: DC | PRN

## 2012-07-31 NOTE — Assessment & Plan Note (Signed)
Pt taking antihistamine and flonase daily Hearing loss sudden Refer to ENT

## 2012-07-31 NOTE — Progress Notes (Signed)
  Subjective:    Patient ID: Deanna Shields, female    DOB: 1929-05-31, 79 y.o.   MRN: [id number]  HPI Pt here c/o hearing loss that came on suddenly a few days ago.  Pt has a little congestion but is using her flonase and allegra.     Review of Systems    as above Objective:   Physical Exam BP 122/72  Pulse 92  Temp(Src) 98.4 F (36.9 C) (Oral)  Wt 175 lb 3.2 oz (79.47 kg)  BMI 30.06 kg/m2  SpO2 95% General appearance: alert, cooperative, appears stated age and no distress Ears: normal TM's and external ear canals both ears Nose: Nares normal. Septum midline. Mucosa normal. No drainage or sinus tenderness. Throat: lips, mucosa, and tongue normal; teeth and gums normal Neck: no adenopathy, supple, symmetrical, trachea midline and thyroid not enlarged, symmetric, no tenderness/mass/nodules Lungs: clear to auscultation bilaterally       Assessment & Plan:

## 2012-07-31 NOTE — Patient Instructions (Signed)
Hearing Loss  A hearing loss is sometimes called deafness. Hearing loss may be partial or total.  CAUSES  Hearing loss may be caused by:   Wax in the ear canal.   Infection of the ear canal.   Infection of the middle ear.   Trauma to the ear or surrounding area.   Fluid in the middle ear.   A hole in the eardrum (perforated eardrum).   Exposure to loud sounds or music.   Problems with the hearing nerve.   Certain medications.  Hearing loss without wax, infection, or a history of injury may mean that the nerve is involved. Hearing loss with severe dizziness, nausea and vomiting or ringing in the ear may suggest a hearing nerve irritation or problems in the middle or inner ear. If hearing loss is untreated, there is a greater likelihood for residual or permanent hearing loss.  DIAGNOSIS  A hearing test (audiometry) assesses hearing loss. The audiometry test needs to be performed by a hearing specialist (audiologist).  TREATMENT  Treatment for recent onset of hearing loss may include:   Ear wax removal.   Medications that kill germs (antibiotics).   Cortisone medications.   Prompt follow up with the appropriate specialist.  Return of hearing depends on the cause of your hearing loss, so proper medical follow-up is important. Some hearing loss may not be reversible, and a caregiver should discuss care and treatment options with you.  SEEK MEDICAL CARE IF:    You have a severe headache, dizziness, or changes in vision.   You have new or increased weakness.   You develop repeated vomiting or other serious medical problems.   You have a fever.  Document Released: 05/24/2005 Document Revised: 08/16/2011 Document Reviewed: 09/18/2009  [employer] Patient Information 2013 [employer].

## 2012-08-01 DIAGNOSIS — H905 Unspecified sensorineural hearing loss: Secondary | ICD-10-CM | POA: Diagnosis not present

## 2012-08-01 DIAGNOSIS — R05 Cough: Secondary | ICD-10-CM | POA: Diagnosis not present

## 2012-08-01 DIAGNOSIS — H6120 Impacted cerumen, unspecified ear: Secondary | ICD-10-CM | POA: Diagnosis not present

## 2012-08-01 DIAGNOSIS — H9190 Unspecified hearing loss, unspecified ear: Secondary | ICD-10-CM | POA: Diagnosis not present

## 2012-08-10 ENCOUNTER — Encounter (HOSPITAL_BASED_OUTPATIENT_CLINIC_OR_DEPARTMENT_OTHER): Payer: Self-pay | Admitting: Family Medicine

## 2012-08-10 ENCOUNTER — Emergency Department (HOSPITAL_BASED_OUTPATIENT_CLINIC_OR_DEPARTMENT_OTHER): Payer: Medicare Other

## 2012-08-10 ENCOUNTER — Emergency Department (HOSPITAL_BASED_OUTPATIENT_CLINIC_OR_DEPARTMENT_OTHER)
Admission: EM | Admit: 2012-08-10 | Discharge: 2012-08-10 | Disposition: A | Discharge status: Home / Self Care | Payer: Medicare Other | Attending: Emergency Medicine | Admitting: Emergency Medicine

## 2012-08-10 DIAGNOSIS — I10 Essential (primary) hypertension: Secondary | ICD-10-CM | POA: Diagnosis not present

## 2012-08-10 DIAGNOSIS — J209 Acute bronchitis, unspecified: Secondary | ICD-10-CM | POA: Diagnosis not present

## 2012-08-10 DIAGNOSIS — R062 Wheezing: Secondary | ICD-10-CM | POA: Diagnosis not present

## 2012-08-10 DIAGNOSIS — IMO0002 Reserved for concepts with insufficient information to code with codable children: Secondary | ICD-10-CM | POA: Diagnosis not present

## 2012-08-10 DIAGNOSIS — R059 Cough, unspecified: Secondary | ICD-10-CM | POA: Diagnosis not present

## 2012-08-10 DIAGNOSIS — Z79899 Other long term (current) drug therapy: Secondary | ICD-10-CM | POA: Insufficient documentation

## 2012-08-10 DIAGNOSIS — R05 Cough: Secondary | ICD-10-CM | POA: Insufficient documentation

## 2012-08-10 MED ORDER — ALBUTEROL SULFATE HFA 108 (90 BASE) MCG/ACT IN AERS
2.0000 | INHALATION_SPRAY | RESPIRATORY_TRACT | Status: DC
  Administered 2012-08-10: 2 via RESPIRATORY_TRACT

## 2012-08-10 MED ORDER — ALBUTEROL SULFATE HFA 108 (90 BASE) MCG/ACT IN AERS
INHALATION_SPRAY | RESPIRATORY_TRACT | Status: AC
  Filled 2012-08-10: qty 6.7

## 2012-08-10 MED ORDER — AZITHROMYCIN 250 MG PO TABS: 250.0000 mg | ORAL_TABLET | Freq: Every day | ORAL | Status: DC

## 2012-08-10 MED ORDER — ALBUTEROL SULFATE (5 MG/ML) 0.5% IN NEBU
5.0000 mg | INHALATION_SOLUTION | Freq: Once | RESPIRATORY_TRACT | Status: AC
  Administered 2012-08-10: 5 mg via RESPIRATORY_TRACT
  Filled 2012-08-10: qty 0.5

## 2012-08-10 MED ORDER — HYDROCOD POLST-CHLORPHEN POLST 10-8 MG/5ML PO LQCR: 5.0000 mL | Freq: Two times a day (BID) | ORAL | Status: DC | PRN

## 2012-08-10 NOTE — ED Provider Notes (Signed)
History     CSN: [id number]  Arrival date & time 08/10/12  [id number]   First MD Initiated Contact with Patient 08/10/12 [phone]      Chief Complaint  Patient presents with  . Wheezing  . Cough    (Consider location/radiation/quality/duration/timing/severity/associated sxs/prior treatment) HPI Comments: Patient presents with a one week history of chest congestion, productive cough.  She was seen by her pcp and given sulfa and prednisone but has worsened.  No fevers or chills.  No chest pain.  She is a non-smoker and has no history of cardiopulmonary disease.  Patient is a 79 y.o. female presenting with wheezing and cough. The history is provided by the patient.  Wheezing Severity:  Moderate Onset quality:  Gradual Timing:  Constant Progression:  Worsening Chronicity:  New Context comment:  None Relieved by:  Nothing Associated symptoms: cough   Cough Associated symptoms: wheezing     Past Medical History  Diagnosis Date  . Hypertension   . Allergy     rhinitis    Past Surgical History  Procedure Laterality Date  . Abdominal hysterectomy    . Hemorroidectomy    . Knee surgery      bilateral  . Carpal tunnel release      Family History  Problem Relation Age of Onset  . Stroke Father 65    light stroke  . Heart disease Sister   . Coronary artery disease Neg Hx   . Diabetes Neg Hx   . Cancer Neg Hx     breast, colon, prostate    History  Substance Use Topics  . Smoking status: Never Smoker   . Smokeless tobacco: Never Used  . Alcohol Use: No    OB History   Grav Para Term Preterm Abortions TAB SAB Ect Mult Living                  Review of Systems  Respiratory: Positive for cough and wheezing.   All other systems reviewed and are negative.    Allergies  Codeine  Home Medications   Current Outpatient Rx  Name  Route  Sig  Dispense  Refill  . PREDNISONE, PAK, PO   Oral   Take by mouth.         . Sulfamethoxazole POWD   Does not apply   by  Does not apply route.         [name] albuterol (VENTOLIN HFA) 108 (90 BASE) MCG/ACT inhaler   Inhalation   Inhale 2 puffs into the lungs every 6 (six) hours as needed for wheezing.   1 Inhaler   2   . aspirin 81 MG tablet   Oral   Take 81 mg by mouth daily.          . beclomethasone (QVAR) 80 MCG/ACT inhaler   Inhalation   Inhale 2 puffs into the lungs 2 (two) times daily.   1 Inhaler   12   . calcium carbonate (OS-CAL) 600 MG TABS   Oral   Take 1,200 mg by mouth daily.          . chlorpheniramine (CHLOR-TRIMETON) 4 MG tablet   Oral   Take 8 mg by mouth at bedtime.         . chlorpheniramine-HYDROcodone (TUSSIONEX PENNKINETIC ER) 10-8 MG/5ML LQCR   Oral   Take 5 mLs by mouth at bedtime as needed.   115 mL   0   . cholecalciferol (VITAMIN D) 1000 UNITS tablet   Oral  Take 1,000 Units by mouth daily.           . cyanocobalamin (,VITAMIN B-12,) 1000 MCG/ML injection   Intramuscular   Inject 1,000 mcg into the muscle every 30 (thirty) days.           . fluticasone (FLONASE) 50 MCG/ACT nasal spray   Nasal   Place 2 sprays into the nose daily.   16 g   6   . gabapentin (NEURONTIN) 300 MG capsule      Take 300 mg once daily x 3 days, then 300 mg twice daily x 3 days then 300 mg 3 times daily and continue at that dose   72 capsule   0   . hydrochlorothiazide (HYDRODIURIL) 25 MG tablet   Oral   Take 12.5 mg by mouth daily.         [name] losartan (COZAAR) 100 MG tablet      TAKE 1 TABLET DAILY   90 tablet   1   . multivitamin (THERAGRAN) per tablet   Oral   Take 1 tablet by mouth daily.           . pantoprazole (PROTONIX) 40 MG tablet   Oral   Take 1 tablet (40 mg total) by mouth daily.   90 tablet   3     BP 158/65  Pulse 86  Temp(Src) 98.1 F (36.7 C) (Oral)  Resp 24  Ht 5\' 6"  (1.676 m)  Wt 174 lb (78.926 kg)  BMI 28.1 kg/m2  SpO2 97%  Physical Exam  Nursing note and vitals reviewed. Constitutional: She is oriented to person,  place, and time. She appears well-developed and well-nourished. No distress.  HENT:  Head: Normocephalic and atraumatic.  Neck: Normal range of motion. Neck supple.  Cardiovascular: Normal rate and regular rhythm.  Exam reveals no gallop and no friction rub.   No murmur heard. Pulmonary/Chest: Effort normal and breath sounds normal. No respiratory distress.  There are scattered rhonchi bilaterally.  Abdominal: Soft. Bowel sounds are normal. She exhibits no distension. There is no tenderness.  Musculoskeletal: Normal range of motion. She exhibits no edema.  Neurological: She is alert and oriented to person, place, and time.  Skin: Skin is warm and dry. She is not diaphoretic.    ED Course  Procedures (including critical care time)  Labs Reviewed - No data to display Dg Chest 2 View  08/10/2012  *RADIOLOGY REPORT*  Clinical Data: Cough and wheezing.  CHEST - 2 VIEW  Comparison: 12/27/2011 and prior chest radiographs  Findings: The cardiomediastinal silhouette is unremarkable. There are hyperinflation noted. Hyperinflation is identified. There is no evidence of focal airspace disease, pulmonary edema, suspicious pulmonary nodule/mass, pleural effusion, or pneumothorax. No acute bony abnormalities are identified.  IMPRESSION: Hyperinflation without evidence of acute cardiopulmonary disease.   Original Report Authenticated By: Margarette Canada, M.D.      No diagnosis found.    MDM  The chest xray looks clear without signs of infection or chf.  This seems infectious in etiology, either viral or bacterial and not on the appropriate antibiotic.  I will change her to zithromax, add an inhaler, and prescribe tussionex to help her with the cough.          Veryl Speak, MD 08/10/12 1004

## 2012-08-10 NOTE — ED Notes (Signed)
Pt c/o cough since Saturday and right mid back pain since coughing. Pt saw ENT last Wed and is taking prednisone and antibiotic for right ear pain and loss of hearing that has resolved. Pt has audible wheezing.

## 2012-08-17 ENCOUNTER — Encounter: Payer: Self-pay | Admitting: *Deleted

## 2012-08-22 ENCOUNTER — Ambulatory Visit: Payer: Medicare Other | Admitting: Gastroenterology

## 2012-09-11 DIAGNOSIS — M5137 Other intervertebral disc degeneration, lumbosacral region: Secondary | ICD-10-CM | POA: Diagnosis not present

## 2012-09-11 DIAGNOSIS — M76899 Other specified enthesopathies of unspecified lower limb, excluding foot: Secondary | ICD-10-CM | POA: Diagnosis not present

## 2012-09-15 ENCOUNTER — Ambulatory Visit (INDEPENDENT_AMBULATORY_CARE_PROVIDER_SITE_OTHER): Payer: Medicare Other | Admitting: Family Medicine

## 2012-09-15 ENCOUNTER — Encounter: Payer: Self-pay | Admitting: Family Medicine

## 2012-09-15 VITALS — BP 132/74 | HR 75 | Temp 98.1°F | Ht 64.0 in | Wt 161.0 lb

## 2012-09-15 DIAGNOSIS — Z0000 Encounter for general adult medical examination without abnormal findings: Secondary | ICD-10-CM

## 2012-09-15 DIAGNOSIS — I10 Essential (primary) hypertension: Secondary | ICD-10-CM

## 2012-09-15 DIAGNOSIS — F430 Acute stress reaction: Secondary | ICD-10-CM | POA: Insufficient documentation

## 2012-09-15 MED ORDER — LOSARTAN POTASSIUM 100 MG PO TABS: ORAL_TABLET | ORAL | Status: DC

## 2012-09-15 MED ORDER — HYDROCOD POLST-CHLORPHEN POLST 10-8 MG/5ML PO LQCR: 5.0000 mL | Freq: Two times a day (BID) | ORAL | Status: DC | PRN

## 2012-09-15 MED ORDER — ALPRAZOLAM 0.25 MG PO TABS: 0.2500 mg | ORAL_TABLET | Freq: Three times a day (TID) | ORAL | Status: DC | PRN

## 2012-09-15 NOTE — Patient Instructions (Addendum)
Preventive Care for Adults, Female A healthy lifestyle and preventive care can promote health and wellness. Preventive health guidelines for women include the following key practices.  A routine yearly physical is a good way to check with your caregiver about your health and preventive screening. It is a chance to share any concerns and updates on your health, and to receive a thorough exam.  Visit your dentist for a routine exam and preventive care every 6 months. Brush your teeth twice a day and floss once a day. Good oral hygiene prevents tooth decay and gum disease.  The frequency of eye exams is based on your age, health, family medical history, use of contact lenses, and other factors. Follow your caregiver's recommendations for frequency of eye exams.  Eat a healthy diet. Foods like vegetables, fruits, whole grains, low-fat dairy products, and lean protein foods contain the nutrients you need without too many calories. Decrease your intake of foods high in solid fats, added sugars, and salt. Eat the right amount of calories for you.Get information about a proper diet from your caregiver, if necessary.  Regular physical exercise is one of the most important things you can do for your health. Most adults should get at least 150 minutes of moderate-intensity exercise (any activity that increases your heart rate and causes you to sweat) each week. In addition, most adults need muscle-strengthening exercises on 2 or more days a week.  Maintain a healthy weight. The body mass index (BMI) is a screening tool to identify possible weight problems. It provides an estimate of body fat based on height and weight. Your caregiver can help determine your BMI, and can help you achieve or maintain a healthy weight.For adults 20 years and older:  A BMI below 18.5 is considered underweight.  A BMI of 18.5 to 24.9 is normal.  A BMI of 25 to 29.9 is considered overweight.  A BMI of 30 and above is  considered obese.  Maintain normal blood lipids and cholesterol levels by exercising and minimizing your intake of saturated fat. Eat a balanced diet with plenty of fruit and vegetables. Blood tests for lipids and cholesterol should begin at age 20 and be repeated every 5 years. If your lipid or cholesterol levels are high, you are over 50, or you are at high risk for heart disease, you may need your cholesterol levels checked more frequently.Ongoing high lipid and cholesterol levels should be treated with medicines if diet and exercise are not effective.  If you smoke, find out from your caregiver how to quit. If you do not use tobacco, do not start.  If you are pregnant, do not drink alcohol. If you are breastfeeding, be very cautious about drinking alcohol. If you are not pregnant and choose to drink alcohol, do not exceed 1 drink per day. One drink is considered to be 12 ounces (355 mL) of beer, 5 ounces (148 mL) of wine, or 1.5 ounces (44 mL) of liquor.  Avoid use of street drugs. Do not share needles with anyone. Ask for help if you need support or instructions about stopping the use of drugs.  High blood pressure causes heart disease and increases the risk of stroke. Your blood pressure should be checked at least every 1 to 2 years. Ongoing high blood pressure should be treated with medicines if weight loss and exercise are not effective.  If you are 55 to 77 years old, ask your caregiver if you should take aspirin to prevent strokes.  Diabetes   screening involves taking a blood sample to check your fasting blood sugar level. This should be done once every 3 years, after age 45, if you are within normal weight and without risk factors for diabetes. Testing should be considered at a younger age or be carried out more frequently if you are overweight and have at least 1 risk factor for diabetes.  Breast cancer screening is essential preventive care for women. You should practice "breast  self-awareness." This means understanding the normal appearance and feel of your breasts and may include breast self-examination. Any changes detected, no matter how small, should be reported to a caregiver. Women in their 20s and 30s should have a clinical breast exam (CBE) by a caregiver as part of a regular health exam every 1 to 3 years. After age 40, women should have a CBE every year. Starting at age 40, women should consider having a mammography (breast X-ray test) every year. Women who have a family history of breast cancer should talk to their caregiver about genetic screening. Women at a high risk of breast cancer should talk to their caregivers about having magnetic resonance imaging (MRI) and a mammography every year.  The Pap test is a screening test for cervical cancer. A Pap test can show cell changes on the cervix that might become cervical cancer if left untreated. A Pap test is a procedure in which cells are obtained and examined from the lower end of the uterus (cervix).  Women should have a Pap test starting at age 21.  Between ages 21 and 29, Pap tests should be repeated every 2 years.  Beginning at age 30, you should have a Pap test every 3 years as long as the past 3 Pap tests have been normal.  Some women have medical problems that increase the chance of getting cervical cancer. Talk to your caregiver about these problems. It is especially important to talk to your caregiver if a new problem develops soon after your last Pap test. In these cases, your caregiver may recommend more frequent screening and Pap tests.  The above recommendations are the same for women who have or have not gotten the vaccine for human papillomavirus (HPV).  If you had a hysterectomy for a problem that was not cancer or a condition that could lead to cancer, then you no longer need Pap tests. Even if you no longer need a Pap test, a regular exam is a good idea to make sure no other problems are  starting.  If you are between ages 65 and 70, and you have had normal Pap tests going back 10 years, you no longer need Pap tests. Even if you no longer need a Pap test, a regular exam is a good idea to make sure no other problems are starting.  If you have had past treatment for cervical cancer or a condition that could lead to cancer, you need Pap tests and screening for cancer for at least 20 years after your treatment.  If Pap tests have been discontinued, risk factors (such as a new sexual partner) need to be reassessed to determine if screening should be resumed.  The HPV test is an additional test that may be used for cervical cancer screening. The HPV test looks for the virus that can cause the cell changes on the cervix. The cells collected during the Pap test can be tested for HPV. The HPV test could be used to screen women aged 30 years and older, and should   be used in women of any age who have unclear Pap test results. After the age of 30, women should have HPV testing at the same frequency as a Pap test.  Colorectal cancer can be detected and often prevented. Most routine colorectal cancer screening begins at the age of 50 and continues through age 75. However, your caregiver may recommend screening at an earlier age if you have risk factors for colon cancer. On a yearly basis, your caregiver may provide home test kits to check for hidden blood in the stool. Use of a small camera at the end of a tube, to directly examine the colon (sigmoidoscopy or colonoscopy), can detect the earliest forms of colorectal cancer. Talk to your caregiver about this at age 50, when routine screening begins. Direct examination of the colon should be repeated every 5 to 10 years through age 75, unless early forms of pre-cancerous polyps or small growths are found.  Hepatitis C blood testing is recommended for all people born from 1945 through 1965 and any individual with known risks for hepatitis C.  Practice  safe sex. Use condoms and avoid high-risk sexual practices to reduce the spread of sexually transmitted infections (STIs). STIs include gonorrhea, chlamydia, syphilis, trichomonas, herpes, HPV, and human immunodeficiency virus (HIV). Herpes, HIV, and HPV are viral illnesses that have no cure. They can result in disability, cancer, and death. Sexually active women aged 25 and younger should be checked for chlamydia. Older women with new or multiple partners should also be tested for chlamydia. Testing for other STIs is recommended if you are sexually active and at increased risk.  Osteoporosis is a disease in which the bones lose minerals and strength with aging. This can result in serious bone fractures. The risk of osteoporosis can be identified using a bone density scan. Women ages 65 and over and women at risk for fractures or osteoporosis should discuss screening with their caregivers. Ask your caregiver whether you should take a calcium supplement or vitamin D to reduce the rate of osteoporosis.  Menopause can be associated with physical symptoms and risks. Hormone replacement therapy is available to decrease symptoms and risks. You should talk to your caregiver about whether hormone replacement therapy is right for you.  Use sunscreen with sun protection factor (SPF) of 30 or more. Apply sunscreen liberally and repeatedly throughout the day. You should seek shade when your shadow is shorter than you. Protect yourself by wearing long sleeves, pants, a wide-brimmed hat, and sunglasses year round, whenever you are outdoors.  Once a month, do a whole body skin exam, using a mirror to look at the skin on your back. Notify your caregiver of new moles, moles that have irregular borders, moles that are larger than a pencil eraser, or moles that have changed in shape or color.  Stay current with required immunizations.  Influenza. You need a dose every fall (or winter). The composition of the flu vaccine  changes each year, so being vaccinated once is not enough.  Pneumococcal polysaccharide. You need 1 to 2 doses if you smoke cigarettes or if you have certain chronic medical conditions. You need 1 dose at age 65 (or older) if you have never been vaccinated.  Tetanus, diphtheria, pertussis (Tdap, Td). Get 1 dose of Tdap vaccine if you are younger than age 65, are over 65 and have contact with an infant, are a healthcare worker, are pregnant, or simply want to be protected from whooping cough. After that, you need a Td   booster dose every 10 years. Consult your caregiver if you have not had at least 3 tetanus and diphtheria-containing shots sometime in your life or have a deep or dirty wound.  HPV. You need this vaccine if you are a woman age 26 or younger. The vaccine is given in 3 doses over 6 months.  Measles, mumps, rubella (MMR). You need at least 1 dose of MMR if you were born in 1957 or later. You may also need a second dose.  Meningococcal. If you are age 19 to 21 and a first-year college student living in a residence hall, or have one of several medical conditions, you need to get vaccinated against meningococcal disease. You may also need additional booster doses.  Zoster (shingles). If you are age 60 or older, you should get this vaccine.  Varicella (chickenpox). If you have never had chickenpox or you were vaccinated but received only 1 dose, talk to your caregiver to find out if you need this vaccine.  Hepatitis A. You need this vaccine if you have a specific risk factor for hepatitis A virus infection or you simply wish to be protected from this disease. The vaccine is usually given as 2 doses, 6 to 18 months apart.  Hepatitis B. You need this vaccine if you have a specific risk factor for hepatitis B virus infection or you simply wish to be protected from this disease. The vaccine is given in 3 doses, usually over 6 months. Preventive Services / Frequency Ages 19 to 39  Blood  pressure check.** / Every 1 to 2 years.  Lipid and cholesterol check.** / Every 5 years beginning at age 20.  Clinical breast exam.** / Every 3 years for women in their 20s and 30s.  Pap test.** / Every 2 years from ages 21 through 29. Every 3 years starting at age 30 through age 65 or 70 with a history of 3 consecutive normal Pap tests.  HPV screening.** / Every 3 years from ages 30 through ages 65 to 70 with a history of 3 consecutive normal Pap tests.  Hepatitis C blood test.** / For any individual with known risks for hepatitis C.  Skin self-exam. / Monthly.  Influenza immunization.** / Every year.  Pneumococcal polysaccharide immunization.** / 1 to 2 doses if you smoke cigarettes or if you have certain chronic medical conditions.  Tetanus, diphtheria, pertussis (Tdap, Td) immunization. / A one-time dose of Tdap vaccine. After that, you need a Td booster dose every 10 years.  HPV immunization. / 3 doses over 6 months, if you are 26 and younger.  Measles, mumps, rubella (MMR) immunization. / You need at least 1 dose of MMR if you were born in 1957 or later. You may also need a second dose.  Meningococcal immunization. / 1 dose if you are age 19 to 21 and a first-year college student living in a residence hall, or have one of several medical conditions, you need to get vaccinated against meningococcal disease. You may also need additional booster doses.  Varicella immunization.** / Consult your caregiver.  Hepatitis A immunization.** / Consult your caregiver. 2 doses, 6 to 18 months apart.  Hepatitis B immunization.** / Consult your caregiver. 3 doses usually over 6 months. Ages 40 to 64  Blood pressure check.** / Every 1 to 2 years.  Lipid and cholesterol check.** / Every 5 years beginning at age 20.  Clinical breast exam.** / Every year after age 40.  Mammogram.** / Every year beginning at age 40   and continuing for as long as you are in good health. Consult with your  caregiver.  Pap test.** / Every 3 years starting at age 30 through age 65 or 70 with a history of 3 consecutive normal Pap tests.  HPV screening.** / Every 3 years from ages 30 through ages 65 to 70 with a history of 3 consecutive normal Pap tests.  Fecal occult blood test (FOBT) of stool. / Every year beginning at age 50 and continuing until age 75. You may not need to do this test if you get a colonoscopy every 10 years.  Flexible sigmoidoscopy or colonoscopy.** / Every 5 years for a flexible sigmoidoscopy or every 10 years for a colonoscopy beginning at age 50 and continuing until age 75.  Hepatitis C blood test.** / For all people born from 1945 through 1965 and any individual with known risks for hepatitis C.  Skin self-exam. / Monthly.  Influenza immunization.** / Every year.  Pneumococcal polysaccharide immunization.** / 1 to 2 doses if you smoke cigarettes or if you have certain chronic medical conditions.  Tetanus, diphtheria, pertussis (Tdap, Td) immunization.** / A one-time dose of Tdap vaccine. After that, you need a Td booster dose every 10 years.  Measles, mumps, rubella (MMR) immunization. / You need at least 1 dose of MMR if you were born in 1957 or later. You may also need a second dose.  Varicella immunization.** / Consult your caregiver.  Meningococcal immunization.** / Consult your caregiver.  Hepatitis A immunization.** / Consult your caregiver. 2 doses, 6 to 18 months apart.  Hepatitis B immunization.** / Consult your caregiver. 3 doses, usually over 6 months. Ages 65 and over  Blood pressure check.** / Every 1 to 2 years.  Lipid and cholesterol check.** / Every 5 years beginning at age 20.  Clinical breast exam.** / Every year after age 40.  Mammogram.** / Every year beginning at age 40 and continuing for as long as you are in good health. Consult with your caregiver.  Pap test.** / Every 3 years starting at age 30 through age 65 or 70 with a 3  consecutive normal Pap tests. Testing can be stopped between 65 and 70 with 3 consecutive normal Pap tests and no abnormal Pap or HPV tests in the past 10 years.  HPV screening.** / Every 3 years from ages 30 through ages 65 or 70 with a history of 3 consecutive normal Pap tests. Testing can be stopped between 65 and 70 with 3 consecutive normal Pap tests and no abnormal Pap or HPV tests in the past 10 years.  Fecal occult blood test (FOBT) of stool. / Every year beginning at age 50 and continuing until age 75. You may not need to do this test if you get a colonoscopy every 10 years.  Flexible sigmoidoscopy or colonoscopy.** / Every 5 years for a flexible sigmoidoscopy or every 10 years for a colonoscopy beginning at age 50 and continuing until age 75.  Hepatitis C blood test.** / For all people born from 1945 through 1965 and any individual with known risks for hepatitis C.  Osteoporosis screening.** / A one-time screening for women ages 65 and over and women at risk for fractures or osteoporosis.  Skin self-exam. / Monthly.  Influenza immunization.** / Every year.  Pneumococcal polysaccharide immunization.** / 1 dose at age 65 (or older) if you have never been vaccinated.  Tetanus, diphtheria, pertussis (Tdap, Td) immunization. / A one-time dose of Tdap vaccine if you are over   65 and have contact with an infant, are a healthcare worker, or simply want to be protected from whooping cough. After that, you need a Td booster dose every 10 years.  Varicella immunization.** / Consult your caregiver.  Meningococcal immunization.** / Consult your caregiver.  Hepatitis A immunization.** / Consult your caregiver. 2 doses, 6 to 18 months apart.  Hepatitis B immunization.** / Check with your caregiver. 3 doses, usually over 6 months. ** Family history and personal history of risk and conditions may change your caregiver's recommendations. Document Released: 07/20/2001 Document Revised: 08/16/2011  Document Reviewed: 10/19/2010 [employer] Patient Information 2013 [employer]  

## 2012-09-15 NOTE — Progress Notes (Signed)
Subjective:    Deanna Shields is a 79 y.o. female who presents for Medicare Annual/Subsequent preventive examination.  Preventive Screening-Counseling & Management  Tobacco History  Smoking status  . Never Smoker   Smokeless tobacco  . Never Used     Problems Prior to Visit 1.   Current Problems (verified) Patient Active Problem List  Diagnosis  . TINEA CRURIS  . B12 DEFICIENCY  . UNSPECIFIED VITAMIN D DEFICIENCY  . DEHYDRATION  . CERUMEN IMPACTION, RIGHT  . HYPERTENSION  . INTERNAL HEMORRHOIDS  . Allergic Rhinitis, Cause Unspecified  . GERD  . HIATAL HERNIA  . NECK PAIN, RIGHT  . BACKACHE NOS  . OSTEOPENIA  . DIZZINESS  . DEPENDENT EDEMA  . SHORTNESS OF BREATH  . COUGH, CHRONIC  . DYSURIA  . ABDOMINAL PAIN, RIGHT UPPER QUADRANT  . Hearing loss of right ear    Medications Prior to Visit Current Outpatient Prescriptions on File Prior to Visit  Medication Sig Dispense Refill  . albuterol (VENTOLIN HFA) 108 (90 BASE) MCG/ACT inhaler Inhale 2 puffs into the lungs every 6 (six) hours as needed for wheezing.  1 Inhaler  2  . aspirin 81 MG tablet Take 81 mg by mouth daily.       . calcium carbonate (OS-CAL) 600 MG TABS Take 1,200 mg by mouth daily.       . cholecalciferol (VITAMIN D) 1000 UNITS tablet Take 1,000 Units by mouth daily.        . cyanocobalamin (,VITAMIN B-12,) 1000 MCG/ML injection Inject 1,000 mcg into the muscle every 30 (thirty) days.        . hydrochlorothiazide (HYDRODIURIL) 25 MG tablet Take 12.5 mg by mouth daily.      . multivitamin (THERAGRAN) per tablet Take 1 tablet by mouth daily.        . fluticasone (FLONASE) 50 MCG/ACT nasal spray Place 2 sprays into the nose daily.  16 g  6  . pantoprazole (PROTONIX) 40 MG tablet Take 1 tablet (40 mg total) by mouth daily.  90 tablet  3   No current facility-administered medications on file prior to visit.    Current Medications (verified) Current Outpatient Prescriptions  Medication Sig Dispense  Refill  . albuterol (VENTOLIN HFA) 108 (90 BASE) MCG/ACT inhaler Inhale 2 puffs into the lungs every 6 (six) hours as needed for wheezing.  1 Inhaler  2  . aspirin 81 MG tablet Take 81 mg by mouth daily.       . calcium carbonate (OS-CAL) 600 MG TABS Take 1,200 mg by mouth daily.       . chlorpheniramine-HYDROcodone (TUSSIONEX PENNKINETIC ER) 10-8 MG/5ML LQCR Take 5 mLs by mouth every 12 (twelve) hours as needed.  140 mL  0  . cholecalciferol (VITAMIN D) 1000 UNITS tablet Take 1,000 Units by mouth daily.        . cyanocobalamin (,VITAMIN B-12,) 1000 MCG/ML injection Inject 1,000 mcg into the muscle every 30 (thirty) days.        . hydrochlorothiazide (HYDRODIURIL) 25 MG tablet Take 12.5 mg by mouth daily.      [name] losartan (COZAAR) 100 MG tablet TAKE 1 TABLET DAILY  90 tablet  3  . multivitamin (THERAGRAN) per tablet Take 1 tablet by mouth daily.        [name] ALPRAZolam (XANAX) 0.25 MG tablet Take 1 tablet (0.25 mg total) by mouth 3 (three) times daily as needed for sleep.  60 tablet  0  . fluticasone (FLONASE) 50 MCG/ACT nasal  spray Place 2 sprays into the nose daily.  16 g  6  . pantoprazole (PROTONIX) 40 MG tablet Take 1 tablet (40 mg total) by mouth daily.  90 tablet  3   No current facility-administered medications for this visit.     Allergies (verified) Codeine   PAST HISTORY  Family History Family History  Problem Relation Age of Onset  . Stroke Father 27    light stroke  . Heart disease Sister   . Coronary artery disease Neg Hx   . Diabetes Neg Hx   . Cancer Neg Hx     breast, colon, prostate    Social History History  Substance Use Topics  . Smoking status: Never Smoker   . Smokeless tobacco: Never Used  . Alcohol Use: No     Are there smokers in your home (other than you)? No  Risk Factors Current exercise habits: The patient does not participate in regular exercise at present.  Dietary issues discussed: na   Cardiac risk factors: advanced age (older than 74 for  men, 69 for women), hypertension and sedentary lifestyle.  Depression Screen (Note: if answer to either of the following is "Yes", a more complete depression screening is indicated)   Over the past two weeks, have you felt down, depressed or hopeless? No  Over the past two weeks, have you felt little interest or pleasure in doing things? No  Have you lost interest or pleasure in daily life? No  Do you often feel hopeless? No  Do you cry easily over simple problems? No  Activities of Daily Living In your present state of health, do you have any difficulty performing the following activities?:  Driving? No Managing money?  No Feeding yourself? No Getting from bed to chair? No Climbing a flight of stairs? No Preparing food and eating?: No Bathing or showering? No Getting dressed: No Getting to the toilet? No Using the toilet:No Moving around from place to place: No In the past year have you fallen or had a near fall?:No   Are you sexually active?  No  Do you have more than one partner?  No  Hearing Difficulties: No Do you often ask people to speak up or repeat themselves? No Do you experience ringing or noises in your ears? No Do you have difficulty understanding soft or whispered voices? No   Do you feel that you have a problem with memory? No  Do you often misplace items? No  Do you feel safe at home?  No  Cognitive Testing  Alert? Yes  Normal Appearance?Yes  Oriented to person? Yes  Place? Yes   Time? Yes  Recall of three objects?  Yes  Can perform simple calculations? Yes  Displays appropriate judgment?Yes  Can read the correct time from a watch face?Yes   Advanced Directives have been discussed with the patient? yes List the Names of Other Physician/Practitioners you currently use: 1. opth--  [place] 2.  Dentist--[place] 3 card-berry 4  ENT--[facility]   Indicate any recent Medical Services you may have received from other than [facility] providers in the past year  (date may be approximate).  Immunization History  Administered Date(s) Administered  . Influenza Split 03/16/2011, 03/01/2012  . Influenza Whole 03/28/2007, 04/01/2009, 02/18/2010  . Pneumococcal Polysaccharide 03/28/2007  . Td 12/20/2005    Screening Tests Health Maintenance  Topic Date Due  . Colonoscopy  12/23/2012  . Influenza Vaccine  02/05/2013  . Tetanus/tdap  12/21/2015  . Pneumococcal Polysaccharide Vaccine  Age 80 And Over  Completed  . Zostavax  Addressed    All answers were reviewed with the patient and necessary referrals were made:  Garnet Koyanagi, DO   09/15/2012   History reviewed:  She  has a past medical history of Hypertension; Rhinitis, allergic; Hemorrhoids; Hiatal hernia; and Esophageal stricture. She  does not have any pertinent problems on file. She  has past surgical history that includes Abdominal hysterectomy; Hemorroidectomy; Knee surgery; and Carpal tunnel release. Her family history includes Heart disease in her sister and Stroke (age of onset: 43) in her father.  There is no history of Coronary artery disease, and Diabetes, and Cancer, . She  reports that she has never smoked. She has never used smokeless tobacco. She reports that she does not drink alcohol or use illicit drugs. She has a current medication list which includes the following prescription(s): albuterol, aspirin, calcium carbonate, chlorpheniramine-hydrocodone, cholecalciferol, cyanocobalamin, hydrochlorothiazide, losartan, multivitamin, alprazolam, fluticasone, and pantoprazole. Current Outpatient Prescriptions on File Prior to Visit  Medication Sig Dispense Refill  . albuterol (VENTOLIN HFA) 108 (90 BASE) MCG/ACT inhaler Inhale 2 puffs into the lungs every 6 (six) hours as needed for wheezing.  1 Inhaler  2  . aspirin 81 MG tablet Take 81 mg by mouth daily.       . calcium carbonate (OS-CAL) 600 MG TABS Take 1,200 mg by mouth daily.       . cholecalciferol (VITAMIN D) 1000 UNITS tablet  Take 1,000 Units by mouth daily.        . cyanocobalamin (,VITAMIN B-12,) 1000 MCG/ML injection Inject 1,000 mcg into the muscle every 30 (thirty) days.        . hydrochlorothiazide (HYDRODIURIL) 25 MG tablet Take 12.5 mg by mouth daily.      . multivitamin (THERAGRAN) per tablet Take 1 tablet by mouth daily.        . fluticasone (FLONASE) 50 MCG/ACT nasal spray Place 2 sprays into the nose daily.  16 g  6  . pantoprazole (PROTONIX) 40 MG tablet Take 1 tablet (40 mg total) by mouth daily.  90 tablet  3   No current facility-administered medications on file prior to visit.   She is allergic to codeine.  Review of Systems Pertinent items are noted in HPI.    Objective:     Vision by Snellen chart: opth  Body mass index is 27.62 kg/(m^2). BP 132/74  Pulse 75  Temp(Src) 98.1 F (36.7 C) (Oral)  Ht 5\' 4"  (1.626 m)  Wt 161 lb (73.029 kg)  BMI 27.62 kg/m2  SpO2 97%  BP 132/74  Pulse 75  Temp(Src) 98.1 F (36.7 C) (Oral)  Ht 5\' 4"  (1.626 m)  Wt 161 lb (73.029 kg)  BMI 27.62 kg/m2  SpO2 97% General appearance: alert, cooperative, appears stated age and no distress Head: Normocephalic, without obvious abnormality, atraumatic Eyes: conjunctivae/corneas clear. PERRL, EOM's intact. Fundi benign. Ears: normal TM's and external ear canals both ears Nose: Nares normal. Septum midline. Mucosa normal. No drainage or sinus tenderness. Throat: lips, mucosa, and tongue normal; teeth and gums normal Neck: no adenopathy, no carotid bruit, no JVD, supple, symmetrical, trachea midline and thyroid not enlarged, symmetric, no tenderness/mass/nodules Back: range of motion normal Lungs: clear to auscultation bilaterally Breasts: normal appearance, no masses or tenderness Heart: S1, S2 normal Abdomen: soft, non-tender; bowel sounds normal; no masses,  no organomegaly Pelvic: not indicated; post-menopausal, no abnormal Pap smears in past Extremities: extremities normal, atraumatic, no cyanosis or  edema  Pulses: 2+ and symmetric Skin: Skin color, texture, turgor normal. No rashes or lesions Lymph nodes: Cervical, supraclavicular, and axillary nodes normal. Neurologic: Alert and oriented X 3, normal strength and tone. Normal symmetric reflexes. Normal coordination and gait Psych-- no depression---  Under  A lot of stress --husband going through radiation for lung cancer (asbestos)      Assessment:     cpe     Plan:     During the course of the visit the patient was educated and counseled about appropriate screening and preventive services including:    Pneumococcal vaccine   Influenza vaccine  Screening mammography  Bone densitometry screening  Colorectal cancer screening  Diabetes screening  Glaucoma screening  Advanced directives: has an advanced directive - a copy HAS NOT been provided.  Diet review for nutrition referral? Yes ____  Not Indicated ___x_   Patient Instructions (the written plan) was given to the patient.  Medicare Attestation I have personally reviewed: The patient's medical and social history Their use of alcohol, tobacco or illicit drugs Their current medications and supplements The patient's functional ability including ADLs,fall risks, home safety risks, cognitive, and hearing and visual impairment Diet and physical activities Evidence for depression or mood disorders  The patient's weight, height, BMI, and visual acuity have been recorded in the chart.  I have made referrals, counseling, and provided education to the patient based on review of the above and I have provided the patient with a written personalized care plan for preventive services.     Garnet Koyanagi, DO   09/15/2012

## 2012-09-15 NOTE — Assessment & Plan Note (Signed)
Stable Cont meds 

## 2012-09-15 NOTE — Assessment & Plan Note (Signed)
Xanax 0.25 1 po tid prn rto prn

## 2012-09-29 ENCOUNTER — Encounter: Payer: Self-pay | Admitting: Cardiovascular Disease

## 2012-11-07 DIAGNOSIS — H3530 Unspecified macular degeneration: Secondary | ICD-10-CM | POA: Diagnosis not present

## 2012-11-07 DIAGNOSIS — H33009 Unspecified retinal detachment with retinal break, unspecified eye: Secondary | ICD-10-CM | POA: Diagnosis not present

## 2012-11-07 DIAGNOSIS — H35379 Puckering of macula, unspecified eye: Secondary | ICD-10-CM | POA: Diagnosis not present

## 2012-11-27 ENCOUNTER — Encounter: Payer: Self-pay | Admitting: Gastroenterology

## 2012-11-28 ENCOUNTER — Telehealth: Payer: Self-pay | Admitting: Family Medicine

## 2012-11-28 MED ORDER — HYDROCOD POLST-CHLORPHEN POLST 10-8 MG/5ML PO LQCR: 5.0000 mL | Freq: Two times a day (BID) | ORAL | Status: DC | PRN

## 2012-11-28 NOTE — Telephone Encounter (Signed)
Refill x1 

## 2012-11-28 NOTE — Telephone Encounter (Signed)
Patient is calling to request a refill on her prescription:  chlorpheniramine-HYDROcodone (TUSSIONEX PENNKINETIC ER) 10-8 MG/5ML LQCR. She uses Educational psychologist on [address].

## 2012-11-28 NOTE — Telephone Encounter (Signed)
Last seen and filled 09/15/12. Please advise     KP

## 2012-11-28 NOTE — Telephone Encounter (Signed)
Rx sent to [facility].    KP

## 2012-12-01 ENCOUNTER — Encounter: Payer: Self-pay | Admitting: *Deleted

## 2012-12-12 ENCOUNTER — Ambulatory Visit (INDEPENDENT_AMBULATORY_CARE_PROVIDER_SITE_OTHER): Payer: Medicare Other | Admitting: Gastroenterology

## 2012-12-12 ENCOUNTER — Encounter: Payer: Self-pay | Admitting: Gastroenterology

## 2012-12-12 VITALS — BP 128/70 | HR 78 | Ht 64.25 in | Wt 188.1 lb

## 2012-12-12 DIAGNOSIS — R1319 Other dysphagia: Secondary | ICD-10-CM

## 2012-12-12 DIAGNOSIS — K219 Gastro-esophageal reflux disease without esophagitis: Secondary | ICD-10-CM

## 2012-12-12 MED ORDER — OMEPRAZOLE 20 MG PO CPDR: 40.0000 mg | DELAYED_RELEASE_CAPSULE | Freq: Every day | ORAL | Status: DC

## 2012-12-12 NOTE — Progress Notes (Signed)
History of Present Illness:  This is a 79 year old Caucasian female with years of a globus sensation in her throat,  ?? solid and liquid food dysphagia, coughing, globus sensation and mostly ENT problems as result of chronic acid reflux.  She has chronic regurgitation and burning substernal chest pain, but apparently has not been on PPI medication for several years.  Last endoscopic exam was in 2009 at which time esophageal dilatation was performed.  She has no history of neuromuscular, neurologic, cardiovascular issues.  Previous pulmonary evaluation is been unremarkable.  I do not think she's had formal ENT exam.  Pulmonary function test were  normal in November of 2013 as was chest CT scan.  I cannot see a previous barium swallow exam.  Cardiac stress test and also has been normal.  She does suffer from chronic anxiety syndrome.  I have reviewed this patient's present history, medical and surgical past history, allergies and medications.     ROS:   All systems were reviewed and are negative unless otherwise stated in the HPI.    Physical Exam: Blood pressure 120/70, pulse 70 and regular, and weight 188 with a BMI of 32.04.  Emanation oropharyngeal areas unremarkable. General well developed well nourished patient in no acute distress, appearing their stated age Eyes PERRLA, no icterus, fundoscopic exam per opthamologist Skin no lesions noted Neck supple, no adenopathy, no thyroid enlargement, no tenderness Chest clear to percussion and auscultation Heart no significant murmurs, gallops or rubs noted Abdomen no hepatosplenomegaly masses or tenderness, BS normal.  Extremities no acute joint lesions, edema, phlebitis or evidence of cellulitis. Neurologic patient oriented x 3, cranial nerves intact, no focal neurologic deficits noted. Psychological mental status normal and normal affect.  Assessment and plan: Extra esophageal manifestations of acid reflux with possible stricture of he distal  esophagus.  I've scheduled her for endoscopy and possible dilatation.  Information concerning acid reflux is management has been given the patient.  I've started on Prilosec 20 mg a day, but she may need stronger   acid suppression depending on her endoscopy.  Endoscopy is normal we will proceed with high-resolution esophageal manometry and also ENT referral.  Patient last had colonoscopy 10 years ago, and is not interested in repeat colonoscopy exam.  She does have a chronic anxiety disorder and is on Xanax 0.25 mg 3 times a day, Tussionex for cough, and also B12 replacement therapy parenterally.  No diagnosis found.

## 2012-12-12 NOTE — Patient Instructions (Signed)
You have been scheduled for an endoscopy with propofol. Please follow written instructions given to you at your visit today. If you use inhalers (even only as needed), please bring them with you on the day of your procedure. Your physician has requested that you go to www.startemmi.com and enter the access code given to you at your visit today. This web site gives a general overview about your procedure. However, you should still follow specific instructions given to you by our office regarding your preparation for the procedure.  We have given you samples of the following medication to take: Omeprazole, please take 40 mg by mouth once daily. If this works well for you please call back for a prescription.  Information on acid reflux is below for you to review. __________________________________________________________________________________________________________________________  Diet for Gastroesophageal Reflux Disease, Adult Reflux (acid reflux) is when acid from your stomach flows up into the esophagus. When acid comes in contact with the esophagus, the acid causes irritation and soreness (inflammation) in the esophagus. When reflux happens often or so severely that it causes damage to the esophagus, it is called gastroesophageal reflux disease (GERD). Nutrition therapy can help ease the discomfort of GERD. FOODS OR DRINKS TO AVOID OR LIMIT  Smoking or chewing tobacco. Nicotine is one of the most potent stimulants to acid production in the gastrointestinal tract.  Caffeinated and decaffeinated coffee and black tea.  Regular or low-calorie carbonated beverages or energy drinks (caffeine-free carbonated beverages are allowed).   Strong spices, such as black pepper, white pepper, red pepper, cayenne, curry powder, and chili powder.  Peppermint or spearmint.  Chocolate.  High-fat foods, including meats and fried foods. Extra added fats including oils, butter, salad dressings, and nuts.  Limit these to less than 8 tsp per day.  Fruits and vegetables if they are not tolerated, such as citrus fruits or tomatoes.  Alcohol.  Any food that seems to aggravate your condition. If you have questions regarding your diet, call your caregiver or a registered dietitian. OTHER THINGS THAT MAY HELP GERD INCLUDE:   Eating your meals slowly, in a relaxed setting.  Eating 5 to 6 small meals per day instead of 3 large meals.  Eliminating food for a period of time if it causes distress.  Not lying down until 3 hours after eating a meal.  Keeping the head of your bed raised 6 to 9 inches (15 to 23 cm) by using a foam wedge or blocks under the legs of the bed. Lying flat may make symptoms worse.  Being physically active. Weight loss may be helpful in reducing reflux in overweight or obese adults.  Wear loose fitting clothing EXAMPLE MEAL PLAN This meal plan is approximately 2,000 calories based on [email or web address] meal planning guidelines. Breakfast   cup cooked oatmeal.  1 cup strawberries.  1 cup low-fat milk.  1 oz almonds. Snack  1 cup cucumber slices.  6 oz yogurt (made from low-fat or fat-free milk). Lunch  2 slice whole-wheat bread.  2 oz sliced [place].  2 tsp mayonnaise.  1 cup blueberries.  1 cup snap peas. Snack  6 whole-wheat crackers.  1 oz string cheese. Dinner   cup brown rice.  1 cup mixed veggies.  1 tsp olive oil.  3 oz grilled fish. Document Released: 05/24/2005 Document Revised: 08/16/2011 Document Reviewed: 04/09/2011 [facility] Patient Information 2014 [place], [place]. ________________________________________________________________________________________  We are excited to introduce MyChart, a new best-in-class service that provides you online access to important information in your electronic medical record. We want to make it easier for you to view your health information - all in  one secure location - when and where you need it. We expect MyChart will enhance the quality of care and service we provide.  When you register for MyChart, you can:    View your test results.    Request appointments and receive appointment reminders via email.    Request medication renewals.    View your medical history, allergies, medications and immunizations.    Communicate with your physician's office through a password-protected site.    Conveniently print information such as your medication lists.  To find out if MyChart is right for you, please talk to a member of our clinical staff today. We will gladly answer your questions about this free health and wellness tool.  If you are age 77 or older and want a member of your family to have access to your record, you must provide written consent by completing a proxy form available at our office. Please speak to our clinical staff about guidelines regarding accounts for patients younger than age 11.  As you activate your MyChart account and need any technical assistance, please call the MyChart technical support line at [phone] 3855148717) or email your question to [email or web address]. If you email your question(s), please include your name, a return phone number and the best time to reach you.  If you have non-urgent health-related questions, you can send a message to our office through [place] at [email or web address]. If you have a medical emergency, call 911.  Thank you for using MyChart as your new health and wellness resource!   MyChart licensed from [employer],  1999-2010. Patents Pending.

## 2012-12-13 ENCOUNTER — Ambulatory Visit (AMBULATORY_SURGERY_CENTER): Payer: Medicare Other | Admitting: Gastroenterology

## 2012-12-13 ENCOUNTER — Encounter: Payer: Self-pay | Admitting: Gastroenterology

## 2012-12-13 VITALS — BP 133/71 | HR 72 | Temp 97.4°F | Resp 14 | Ht 64.0 in | Wt 188.0 lb

## 2012-12-13 DIAGNOSIS — R1319 Other dysphagia: Secondary | ICD-10-CM

## 2012-12-13 DIAGNOSIS — K2970 Gastritis, unspecified, without bleeding: Secondary | ICD-10-CM

## 2012-12-13 DIAGNOSIS — K219 Gastro-esophageal reflux disease without esophagitis: Secondary | ICD-10-CM

## 2012-12-13 MED ORDER — SODIUM CHLORIDE 0.9 % IV SOLN: 500.0000 mL | INTRAVENOUS | Status: DC

## 2012-12-13 NOTE — Progress Notes (Signed)
Called to room to assist during endoscopic procedure.  Patient ID and intended procedure confirmed with present staff. Received instructions for my participation in the procedure from the performing physician.  

## 2012-12-13 NOTE — Op Note (Signed)
[facility]  [address] [place], [address]   ENDOSCOPY PROCEDURE REPORT  PATIENT: Deanna Shields, Deanna Shields  MR#: [id number] BIRTHDATE: [birth year] , 84  yrs. old GENDER: Female ENDOSCOPIST:Nicha Hemann Consuello Masse, MD, [facility] REFERRED BY: Garnet Koyanagi, DO PROCEDURE DATE:  12/13/2012 PROCEDURE:   EGD w/ biopsy for H.pylori and Maloney dilation of esophagus ASA CLASS:    Class II INDICATIONS: Dysphagia and History of esophageal reflux. MEDICATION: propofol (Diprivan) 50mg  IV TOPICAL ANESTHETIC:  DESCRIPTION OF PROCEDURE:   After the risks and benefits of the procedure were explained, informed consent was obtained.  The LB [id number]  endoscope was introduced through the mouth  and advanced to the second portion of the duodenum .  The instrument was slowly withdrawn as the mucosa was fully examined.      DUODENUM: The duodenal mucosa showed no abnormalities in the bulb and second portion of the duodenum.  STOMACH: There was mild antral gastropathy noted.  Cold forcep biopsies were taken at the antrum. for CLO testing.  ESOPHAGUS: A 2 cm hiatal hernia was noted.   The mucosa of the esophagus appeared normal. Dilated #61F Maloney dilator...no heme or pain.   Retroflexed views revealed a small hiatal hernia.    The scope was then withdrawn from the patient and the procedure completed.  COMPLICATIONS: There were no complications.   ENDOSCOPIC IMPRESSION: 1.   The duodenal mucosa showed no abnormalities in the bulb and second portion of the duodenum 2.   There was mild antral gastropathy noted [T2]...r/o H.pylori 3.   2 cm hiatal hernia 4.   The mucosa of the esophagus appeared normal..probable occult stricture dilated.  RECOMMENDATIONS: 1.  Await pathology results 2.  Continue PPI 3.  Dilatations PRN 4.  Rx CLO if positive 5. Esophageal manometry if dysphagia persists   _______________________________ eSigned:  Sable Feil, MD, [facility] 12/13/2012  10:21 AM   standard discharge

## 2012-12-13 NOTE — Progress Notes (Signed)
Patient did not experience any of the following events: a burn prior to discharge; a fall within the facility; wrong site/side/patient/procedure/implant event; or a hospital transfer or hospital admission upon discharge from the facility. (G8907) Patient did not have preoperative order for IV antibiotic SSI prophylaxis. (G8918)  

## 2012-12-13 NOTE — Progress Notes (Signed)
Lidocaine-40mg IV prior to Propofol InductionPropofol given over incremental dosages 

## 2012-12-13 NOTE — Patient Instructions (Addendum)
Discharge instructions given with verbal understanding. Handouts on gastritis,hiatal hernia and a dilatation diet given. Resume previous medications. YOU HAD AN ENDOSCOPIC PROCEDURE TODAY AT [facility]: Refer to the procedure report that was given to you for any specific questions about what was found during the examination.  If the procedure report does not answer your questions, please call your gastroenterologist to clarify.  If you requested that your care partner not be given the details of your procedure findings, then the procedure report has been included in a sealed envelope for you to review at your convenience later.  YOU SHOULD EXPECT: Some feelings of bloating in the abdomen. Passage of more gas than usual.  Walking can help get rid of the air that was put into your GI tract during the procedure and reduce the bloating. If you had a lower endoscopy (such as a colonoscopy or flexible sigmoidoscopy) you may notice spotting of blood in your stool or on the toilet paper. If you underwent a bowel prep for your procedure, then you may not have a normal bowel movement for a few days.  DIET: Your first meal following the procedure should be a light meal and then it is ok to progress to your normal diet.  A half-sandwich or bowl of soup is an example of a good first meal.  Heavy or fried foods are harder to digest and may make you feel nauseous or bloated.  Likewise meals heavy in dairy and vegetables can cause extra gas to form and this can also increase the bloating.  Drink plenty of fluids but you should avoid alcoholic beverages for 24 hours.  ACTIVITY: Your care partner should take you home directly after the procedure.  You should plan to take it easy, moving slowly for the rest of the day.  You can resume normal activity the day after the procedure however you should NOT DRIVE or use heavy machinery for 24 hours (because of the sedation medicines used during the test).     SYMPTOMS TO REPORT IMMEDIATELY: A gastroenterologist can be reached at any hour.  During normal business hours, 8:30 AM to 5:00 PM Monday through Friday, call [phone].  After hours and on weekends, please call the GI answering service at [phone] who will take a message and have the physician on call contact you.   Following upper endoscopy (EGD)  Vomiting of blood or coffee ground material  New chest pain or pain under the shoulder blades  Painful or persistently difficult swallowing  New shortness of breath  Fever of 100F or higher  Black, tarry-looking stools  FOLLOW UP: If any biopsies were taken you will be contacted by phone or by letter within the next 1-3 weeks.  Call your gastroenterologist if you have not heard about the biopsies in 3 weeks.  Our staff will call the home number listed on your records the next business day following your procedure to check on you and address any questions or concerns that you may have at that time regarding the information given to you following your procedure. This is a courtesy call and so if there is no answer at the home number and we have not heard from you through the emergency physician on call, we will assume that you have returned to your regular daily activities without incident.  SIGNATURES/CONFIDENTIALITY: You and/or your care partner have signed paperwork which will be entered into your electronic medical record.  These signatures attest to the fact that  that the information above on your After Visit Summary has been reviewed and is understood.  Full responsibility of the confidentiality of this discharge information lies with you and/or your care-partner.

## 2012-12-14 ENCOUNTER — Telehealth: Payer: Self-pay

## 2012-12-14 LAB — HELICOBACTER PYLORI SCREEN-BIOPSY: UREASE: NEGATIVE

## 2012-12-14 NOTE — Telephone Encounter (Signed)
  Follow up Call-  Call back number 12/13/2012  Post procedure Call Back phone  # [phone]  Permission to leave phone message Yes     Patient questions:  Do you have a fever, pain , or abdominal swelling? no Pain Score  0 *  Have you tolerated food without any problems? yes  Have you been able to return to your normal activities? yes  Do you have any questions about your discharge instructions: Diet   no Medications  no Follow up visit  no  Do you have questions or concerns about your Care? no  Actions: * If pain score is 4 or above: No action needed, pain <4.

## 2012-12-15 ENCOUNTER — Encounter: Payer: Self-pay | Admitting: Gastroenterology

## 2012-12-22 ENCOUNTER — Encounter: Payer: Self-pay | Admitting: Lab

## 2012-12-25 ENCOUNTER — Encounter: Payer: Self-pay | Admitting: Family Medicine

## 2012-12-25 ENCOUNTER — Ambulatory Visit (INDEPENDENT_AMBULATORY_CARE_PROVIDER_SITE_OTHER): Payer: Medicare Other | Admitting: Family Medicine

## 2012-12-25 VITALS — BP 132/64 | HR 80 | Temp 98.4°F | Wt 171.4 lb

## 2012-12-25 DIAGNOSIS — R42 Dizziness and giddiness: Secondary | ICD-10-CM

## 2012-12-25 DIAGNOSIS — R269 Unspecified abnormalities of gait and mobility: Secondary | ICD-10-CM | POA: Diagnosis not present

## 2012-12-25 DIAGNOSIS — I10 Essential (primary) hypertension: Secondary | ICD-10-CM

## 2012-12-25 DIAGNOSIS — I739 Peripheral vascular disease, unspecified: Secondary | ICD-10-CM | POA: Diagnosis not present

## 2012-12-25 DIAGNOSIS — R252 Cramp and spasm: Secondary | ICD-10-CM | POA: Diagnosis not present

## 2012-12-25 DIAGNOSIS — J309 Allergic rhinitis, unspecified: Secondary | ICD-10-CM

## 2012-12-25 LAB — CBC WITH DIFFERENTIAL/PLATELET
Basophils Relative: 0.6 % (ref 0.0–3.0)
Eosinophils Absolute: 0.4 10*3/uL (ref 0.0–0.7)
Eosinophils Relative: 4.4 % (ref 0.0–5.0)
HCT: 34.5 % — ABNORMAL LOW (ref 36.0–46.0)
Hemoglobin: 11 g/dL — ABNORMAL LOW (ref 12.0–15.0)
MCHC: 32 g/dL (ref 30.0–36.0)
MCV: 84.3 fl (ref 78.0–100.0)
Monocytes Absolute: 0.6 10*3/uL (ref 0.1–1.0)
Neutro Abs: 4.5 10*3/uL (ref 1.4–7.7)
Neutrophils Relative %: 52.4 % (ref 43.0–77.0)
RBC: 4.09 Mil/uL (ref 3.87–5.11)
WBC: 8.6 10*3/uL (ref 4.5–10.5)

## 2012-12-25 LAB — HEPATIC FUNCTION PANEL
ALT: 16 U/L (ref 0–35)
Albumin: 3.9 g/dL (ref 3.5–5.2)
Bilirubin, Direct: 0 mg/dL (ref 0.0–0.3)
Total Protein: 7.1 g/dL (ref 6.0–8.3)

## 2012-12-25 LAB — BASIC METABOLIC PANEL
CO2: 28 mEq/L (ref 19–32)
Chloride: 101 mEq/L (ref 96–112)
Potassium: 4.6 mEq/L (ref 3.5–5.1)
Sodium: 138 mEq/L (ref 135–145)

## 2012-12-25 LAB — PHOSPHORUS: Phosphorus: 3.2 mg/dL (ref 2.3–4.6)

## 2012-12-25 LAB — MAGNESIUM: Magnesium: 2.1 mg/dL (ref 1.5–2.5)

## 2012-12-25 LAB — LIPID PANEL
HDL: 47 mg/dL (ref >39.00)
Triglycerides: 157 mg/dL — ABNORMAL HIGH (ref 0.0–149.0)

## 2012-12-25 NOTE — Patient Instructions (Addendum)
Intermittent Claudication Blockage of leg arteries results from poor circulation of blood in the leg arteries. This produces an aching, tired, and sometimes burning pain in the legs that is brought on by exercise and made better by rest. Claudication refers to the limping that happens from leg cramps. It is also referred to as Vaso-occlusive disease of the legs, arterial insufficiency of the legs, recurrent leg pain, recurrent leg cramping and calf pain with exercise.  CAUSES  This condition is due to narrowing or blockage of the arteries (muscular vessels which carry blood away from the heart and around the body). Blockage of arteries can occur anywhere in the body. If they occur in the heart, a person may experience angina (chest pain) or even a heart attack. If they occur in the neck or the brain, a person may have a stroke. Intermittent claudication is when the blockage occurs in the legs, most commonly in the calf or the foot.  Atherosclerosis, or blockage of arteries, can occur for many reasons. Some of these are smoking, diabetes, and high cholesterol. SYMPTOMS  Intermittent claudication may occur in both legs, and it often continues to get worse over time. However, some people complain only of weakness in the legs when walking, or a feeling of "tiredness" in the buttocks. Impotence (not able to have an erection) is an occasional complaint in men. Pain while resting is uncommon.  WHAT TO EXPECT AT YOUR HEALTH CARE PROVIDER'S OFFICE: Your medical history will be asked for and a physical examination will be performed. Medical history questions documenting claudication in detail may include:   Time pattern  Do you have leg cramps at night (nocturnal cramps)?  How often does leg pain with cramping occur?  Is it getting worse?  What is the quality of the pain?  Is the pain sharp?  Is there an aching pain with the cramps?  Aggravating factors  Is it worse after you exercise?  Is it  worse after you are standing for a while?  Do you smoke? How much?  Do you drink alcohol? How much?  Are you diabetic? How well is your blood sugar controlled?  Other  What other symptoms are also present?  Has there been impotence (men)?  Is there pain in the back?  Is there a darkening of the skin of the legs, feet or toes?  Is there weakness or paralysis of the legs? The physical examination may include evaluation of the femoral pulse (in the groin) and the other areas where the pulse can be felt in the legs. DIAGNOSIS  Diagnostic tests that may be performed include:  Blood pressure measured in arms and legs for comparison.  Doppler ultrasonography on the legs and the heart.  Duplex Doppler/ultrasound exam of extremity to visualize arterial blood flow.  ECG- to evaluate the activity of your heart.  Aortography- to visualize blockages in your arteries. TREATMENT Surgical treatment may be suggested if claudication interferes with the patient's activities or work, and if the diseased arteries do not seem to be improving after treatment. Be aware that this condition can worsen over time and you should carefully monitor your condition. HOME CARE INSTRUCTIONS  Talk to your caregiver about the cause of your leg cramping and about what to do at home to relieve it.  A healthy diet is important to lessen the likeliness of atherosclerosis.  A program of daily walking for short periods, and stopping for pain or cramping, may help improve function.  It is important to   stop smoking.  Avoid putting hot or cold items on legs.  Avoid tight shoes. SEEK MEDICAL CARE IF: There are many other causes of leg pain such as arthritis or low blood potassium. However, some causes of leg pain may be life threatening such as a blood clot in the legs. Seek medical attention if you have:  Leg pain that does not go away.  Legs that may be red, hot or swollen.  Ulcers or sores appear on your  ankle or foot.  Any chest pain or shortness of breath accompanying leg pain.  Diabetes.  You are pregnant. SEEK IMMEDIATE MEDICAL CARE IF:   Your leg pain becomes severe or will not go away.  Your foot turns blue or a dark color.  Your leg becomes red, hot or swollen or you develop a fever over 102F.  Any chest pain or shortness of breath accompanying leg pain. MAKE SURE YOU:   Understand these instructions.  Will watch your condition.  Will get help right away if you are not doing well or get worse. Document Released: 03/26/2004 Document Revised: 08/16/2011 Document Reviewed: 01/12/2008 [facility] Patient Information 2014 [place].

## 2012-12-25 NOTE — Assessment & Plan Note (Signed)
Check labs 

## 2012-12-25 NOTE — Progress Notes (Signed)
  Subjective:    Patient ID: Deanna Shields, female    DOB: September 26, 1928, 79 y.o.   MRN: [id number]  HPI Pt here c/o leg cramps at night and a heaviness in legs after walking a short distance and it resolves with rest.  Pt also c/o dizziness.  She is having some upper resp symptoms , sneezing, and fullness in ears. No palpitations, no chest pain.     Review of Systems As above    Objective:   Physical Exam  BP 132/64  Pulse 80  Temp(Src) 98.4 F (36.9 C) (Oral)  Wt 171 lb 6.4 oz (77.747 kg)  BMI 29.41 kg/m2  SpO2 97% General appearance: alert, cooperative and appears stated age Ears: normal TM's and external ear canals both ears Nose: clear discharge, mild congestion, no sinus tenderness Throat: lips, mucosa, and tongue normal; teeth and gums normal Neck: no adenopathy, no JVD, supple, symmetrical, trachea midline and thyroid not enlarged, symmetric, no tenderness/mass/nodules Lungs: clear to auscultation bilaterally Heart: S1, S2 normal Extremities: extremities normal, atraumatic, no cyanosis or edema--no calf pain                              Assessment & Plan:

## 2012-12-25 NOTE — Assessment & Plan Note (Signed)
dymista sample given to pt Use otc antihistamine

## 2012-12-25 NOTE — Addendum Note (Signed)
Addended by: [place] Morrow D on: 12/25/2012 11:36 AM   Modules accepted: Orders

## 2012-12-25 NOTE — Assessment & Plan Note (Signed)
Check art doppler 

## 2012-12-27 DIAGNOSIS — Z79899 Other long term (current) drug therapy: Secondary | ICD-10-CM | POA: Diagnosis not present

## 2012-12-27 LAB — POCT URINALYSIS DIPSTICK
Bilirubin, UA: NEGATIVE
Ketones, UA: NEGATIVE
Leukocytes, UA: NEGATIVE
Spec Grav, UA: <=1.005
pH, UA: 8

## 2012-12-28 ENCOUNTER — Encounter (INDEPENDENT_AMBULATORY_CARE_PROVIDER_SITE_OTHER): Payer: Medicare Other

## 2012-12-28 DIAGNOSIS — I739 Peripheral vascular disease, unspecified: Secondary | ICD-10-CM

## 2013-01-03 ENCOUNTER — Telehealth: Payer: Self-pay

## 2013-01-03 NOTE — Telephone Encounter (Signed)
LVM for patient to return call. 

## 2013-01-03 NOTE — Telephone Encounter (Signed)
Message copied by Reino Bellis on Wed Jan 03, 2013  4:50 PM ------      Message from: Midge Minium      Created: Wed Jan 03, 2013  4:46 PM       Normal ABIs- no evidence of arterial problems in legs ------

## 2013-01-30 ENCOUNTER — Encounter: Payer: Self-pay | Admitting: Family Medicine

## 2013-02-01 ENCOUNTER — Telehealth: Payer: Self-pay | Admitting: Family Medicine

## 2013-02-01 NOTE — Telephone Encounter (Signed)
Patient is calling to request a refill of her medication:  chlorpheniramine-HYDROcodone (TUSSIONEX PENNKINETIC ER) 10-8 MG/5ML LQCR be sent to her pharmacy on file. Please advise.

## 2013-02-02 MED ORDER — HYDROCOD POLST-CHLORPHEN POLST 10-8 MG/5ML PO LQCR: 5.0000 mL | Freq: Two times a day (BID) | ORAL | Status: DC | PRN

## 2013-02-02 NOTE — Telephone Encounter (Signed)
Refill x1 

## 2013-02-02 NOTE — Telephone Encounter (Signed)
Rx faxed to [facility]

## 2013-02-02 NOTE — Telephone Encounter (Signed)
Last seen 12/25/12 and filled 11/28/12. Please advise       KP

## 2013-02-19 ENCOUNTER — Telehealth: Payer: Self-pay | Admitting: Family Medicine

## 2013-02-19 DIAGNOSIS — I10 Essential (primary) hypertension: Secondary | ICD-10-CM

## 2013-02-19 MED ORDER — LOSARTAN POTASSIUM 100 MG PO TABS: ORAL_TABLET | ORAL | Status: DC

## 2013-02-19 NOTE — Telephone Encounter (Signed)
Patient is calling to request a refill on her losartan (COZAAR) 100 MG tablet. Needs sent to [facility].

## 2013-02-20 ENCOUNTER — Telehealth: Payer: Self-pay | Admitting: Family Medicine

## 2013-02-20 DIAGNOSIS — I10 Essential (primary) hypertension: Secondary | ICD-10-CM

## 2013-02-20 MED ORDER — LOSARTAN POTASSIUM 100 MG PO TABS: ORAL_TABLET | ORAL | Status: DC

## 2013-02-20 NOTE — Telephone Encounter (Signed)
[facility].      KP

## 2013-02-20 NOTE — Telephone Encounter (Signed)
Pt requested that [facility]. She is not using [facility] for this medication. She has already notified [facility] to put back the losartan.

## 2013-03-14 ENCOUNTER — Ambulatory Visit (INDEPENDENT_AMBULATORY_CARE_PROVIDER_SITE_OTHER): Payer: Medicare Other

## 2013-03-14 DIAGNOSIS — Z23 Encounter for immunization: Secondary | ICD-10-CM

## 2013-03-28 ENCOUNTER — Telehealth: Payer: Self-pay | Admitting: Family Medicine

## 2013-03-28 NOTE — Telephone Encounter (Signed)
Last seen 12/25/12 and filled 02/02/13. Please advise     KP

## 2013-03-28 NOTE — Telephone Encounter (Signed)
Refill x1 

## 2013-03-28 NOTE — Telephone Encounter (Signed)
Patient is calling to request a refill of her medication: chlorpheniramine-HYDROcodone (TUSSIONEX PENNKINETIC ER) 10-8 MG/5ML LQCR to be sent to her pharmacy on file. Please advise.

## 2013-03-29 ENCOUNTER — Ambulatory Visit (INDEPENDENT_AMBULATORY_CARE_PROVIDER_SITE_OTHER)
Admission: RE | Admit: 2013-03-29 | Discharge: 2013-03-29 | Disposition: A | Discharge status: Home / Self Care | Payer: Medicare Other | Source: Ambulatory Visit | Attending: Internal Medicine | Admitting: Internal Medicine

## 2013-03-29 DIAGNOSIS — J387 Other diseases of larynx: Secondary | ICD-10-CM

## 2013-03-29 DIAGNOSIS — J984 Other disorders of lung: Secondary | ICD-10-CM | POA: Diagnosis not present

## 2013-03-29 DIAGNOSIS — R911 Solitary pulmonary nodule: Secondary | ICD-10-CM | POA: Diagnosis not present

## 2013-03-29 DIAGNOSIS — R05 Cough: Secondary | ICD-10-CM | POA: Diagnosis not present

## 2013-03-29 MED ORDER — HYDROCOD POLST-CHLORPHEN POLST 10-8 MG/5ML PO LQCR: 5.0000 mL | Freq: Two times a day (BID) | ORAL | Status: DC | PRN

## 2013-03-29 NOTE — Telephone Encounter (Signed)
Confirmed pharmacy. Rx sent to [facility] on [address].      KP

## 2013-04-04 ENCOUNTER — Telehealth: Payer: Self-pay | Admitting: Internal Medicine

## 2013-04-04 DIAGNOSIS — R911 Solitary pulmonary nodule: Secondary | ICD-10-CM

## 2013-04-04 NOTE — Telephone Encounter (Signed)
STable CT with stable nodule in 1 year.  Very reassuring this is not cancer. To be on safe side, repeat  Ct chest wo contrast i 1 year  Thanks  Dr. Brand Males, M.D., [facility].C.P Pulmonary and Critical Care Medicine Staff Physician [facility] Pager: [phone], If no answer or between  15:00h - 7:00h: call [phone]  04/04/2013 10:57 AM      COMPARISON: 03/27/2012  FINDINGS: Several tiny pulmonary nodules are again seen in the left lower lobe, largest with average diameter of 6 mm (4 x 7) mm on image 40. All of these are stable suggesting a benign etiology. No new or enlarging pulmonary nodules or masses are identified.  No evidence of pulmonary infiltrate or central endobronchial lesion. No evidence of pleural or pericardial effusion. No evidence of hilar or mediastinal lymphadenopathy in this noncontrast study. No adenopathy seen elsewhere within the thorax. Both adrenal glands are normal in appearance.  IMPRESSION: Stable tiny left lower lobe pulmonary nodules, largest with average diameter of 6 mm. In the absence of risk factors for bronchogenic carcinoma, no further imaging followup is required. If patient has risk factors for bronchogenic carcinoma, an additional follow CT is recommended again in 12 months. This recommendation follows the consensus statement: Guidelines for Management of Small Pulmonary Nodules Detected on CT Scans: A Statement from the [place] as published in Radiology 2005; 237:395-400.   Electronically Signed By: Earle Gell M.D. On: 03/29/2013 11:15

## 2013-04-04 NOTE — Telephone Encounter (Signed)
Please advise regarding CT results MR thanks

## 2013-04-04 NOTE — Telephone Encounter (Signed)
Pt aware of results and recs per MR. Order placed for CT chest w/o contrast

## 2013-05-22 ENCOUNTER — Telehealth: Payer: Self-pay | Admitting: Family Medicine

## 2013-05-22 NOTE — Telephone Encounter (Signed)
Refill x1 

## 2013-05-22 NOTE — Telephone Encounter (Signed)
Last seen 12/25/12 and filled 03/29/13. Please advise      KP

## 2013-05-22 NOTE — Telephone Encounter (Signed)
Patient called and requested a refill for chlorpheniramine-HYDROcodone (TUSSIONEX PENNKINETIC ER) 10-8 MG/5ML [facility] on [address]

## 2013-05-23 MED ORDER — HYDROCOD POLST-CHLORPHEN POLST 10-8 MG/5ML PO LQCR: 5.0000 mL | Freq: Two times a day (BID) | ORAL | Status: DC | PRN

## 2013-05-23 NOTE — Telephone Encounter (Signed)
Patient aware Rx will be ready to pick up tomorrow.      KP

## 2013-06-08 DIAGNOSIS — M76899 Other specified enthesopathies of unspecified lower limb, excluding foot: Secondary | ICD-10-CM | POA: Diagnosis not present

## 2013-06-19 DIAGNOSIS — H353190 Nonexudative age-related macular degeneration, unspecified eye, stage unspecified: Secondary | ICD-10-CM | POA: Diagnosis not present

## 2013-06-19 DIAGNOSIS — H2510 Age-related nuclear cataract, unspecified eye: Secondary | ICD-10-CM | POA: Diagnosis not present

## 2013-06-19 DIAGNOSIS — H33319 Horseshoe tear of retina without detachment, unspecified eye: Secondary | ICD-10-CM | POA: Diagnosis not present

## 2013-06-19 DIAGNOSIS — H35379 Puckering of macula, unspecified eye: Secondary | ICD-10-CM | POA: Diagnosis not present

## 2013-06-19 DIAGNOSIS — H43819 Vitreous degeneration, unspecified eye: Secondary | ICD-10-CM | POA: Diagnosis not present

## 2013-07-16 ENCOUNTER — Telehealth: Payer: Self-pay | Admitting: *Deleted

## 2013-07-16 NOTE — Telephone Encounter (Signed)
Called patient and she did not answer. Would like to know patient's symptoms. Last seen 12/2012. JG//CMA

## 2013-07-16 NOTE — Telephone Encounter (Signed)
Patient called and requested a refill for chlorpheniramine-HYDROcodone (TUSSIONEX PENNKINETIC ER) 10-8 MG/5ML [place], [place].

## 2013-07-17 NOTE — Telephone Encounter (Signed)
Spoke with patient and advised that she needed an OV before we could refill med. Appt scheduled for Thur 2/11 at 11:15

## 2013-07-18 ENCOUNTER — Encounter: Payer: Self-pay | Admitting: Family Medicine

## 2013-07-18 ENCOUNTER — Telehealth: Payer: Self-pay | Admitting: *Deleted

## 2013-07-18 ENCOUNTER — Ambulatory Visit (INDEPENDENT_AMBULATORY_CARE_PROVIDER_SITE_OTHER): Payer: Medicare Other | Admitting: Family Medicine

## 2013-07-18 VITALS — BP 122/70 | HR 75 | Temp 98.1°F | Wt 161.0 lb

## 2013-07-18 DIAGNOSIS — R059 Cough, unspecified: Secondary | ICD-10-CM

## 2013-07-18 DIAGNOSIS — R05 Cough: Secondary | ICD-10-CM | POA: Diagnosis not present

## 2013-07-18 DIAGNOSIS — E785 Hyperlipidemia, unspecified: Secondary | ICD-10-CM | POA: Diagnosis not present

## 2013-07-18 DIAGNOSIS — K449 Diaphragmatic hernia without obstruction or gangrene: Secondary | ICD-10-CM

## 2013-07-18 DIAGNOSIS — D649 Anemia, unspecified: Secondary | ICD-10-CM

## 2013-07-18 DIAGNOSIS — R053 Chronic cough: Secondary | ICD-10-CM

## 2013-07-18 LAB — BASIC METABOLIC PANEL
BUN: 27 mg/dL — ABNORMAL HIGH (ref 6–23)
CALCIUM: 9.6 mg/dL (ref 8.4–10.5)
CO2: 28 mEq/L (ref 19–32)
Chloride: 103 mEq/L (ref 96–112)
Creatinine, Ser: 1.1 mg/dL (ref 0.4–1.2)
GFR: 50.44 mL/min — AB (ref >60.00)
Glucose, Bld: 92 mg/dL (ref 70–99)
Potassium: 4 mEq/L (ref 3.5–5.1)
SODIUM: 140 meq/L (ref 135–145)

## 2013-07-18 LAB — HEPATIC FUNCTION PANEL
ALK PHOS: 71 U/L (ref 39–117)
ALT: 17 U/L (ref 0–35)
AST: 27 U/L (ref 0–37)
Albumin: 4 g/dL (ref 3.5–5.2)
BILIRUBIN DIRECT: 0 mg/dL (ref 0.0–0.3)
TOTAL PROTEIN: 7.1 g/dL (ref 6.0–8.3)
Total Bilirubin: 0.7 mg/dL (ref 0.3–1.2)

## 2013-07-18 LAB — CBC WITH DIFFERENTIAL/PLATELET
BASOS PCT: 0.5 % (ref 0.0–3.0)
Basophils Absolute: 0 10*3/uL (ref 0.0–0.1)
Eosinophils Absolute: 0.2 10*3/uL (ref 0.0–0.7)
Eosinophils Relative: 2.2 % (ref 0.0–5.0)
HCT: 37.2 % (ref 36.0–46.0)
HEMOGLOBIN: 11.9 g/dL — AB (ref 12.0–15.0)
LYMPHS PCT: 29.4 % (ref 12.0–46.0)
Lymphs Abs: 2.5 10*3/uL (ref 0.7–4.0)
MCHC: 32.1 g/dL (ref 30.0–36.0)
MCV: 88.9 fl (ref 78.0–100.0)
MONOS PCT: 7.1 % (ref 3.0–12.0)
Monocytes Absolute: 0.6 10*3/uL (ref 0.1–1.0)
Neutro Abs: 5.1 10*3/uL (ref 1.4–7.7)
Neutrophils Relative %: 60.8 % (ref 43.0–77.0)
Platelets: 346 10*3/uL (ref 150.0–400.0)
RBC: 4.19 Mil/uL (ref 3.87–5.11)
RDW: 16.2 % — ABNORMAL HIGH (ref 11.5–14.6)
WBC: 8.4 10*3/uL (ref 4.5–10.5)

## 2013-07-18 LAB — LIPID PANEL
Cholesterol: 225 mg/dL — ABNORMAL HIGH (ref 0–200)
HDL: 47.4 mg/dL (ref >39.00)
Total CHOL/HDL Ratio: 5
Triglycerides: 112 mg/dL (ref 0.0–149.0)
VLDL: 22.4 mg/dL (ref 0.0–40.0)

## 2013-07-18 LAB — IBC PANEL
IRON: 25 ug/dL — AB (ref 42–145)
SATURATION RATIOS: 5.3 % — AB (ref 20.0–50.0)
TRANSFERRIN: 335 mg/dL (ref 212.0–360.0)

## 2013-07-18 LAB — FERRITIN: Ferritin: 9.6 ng/mL — ABNORMAL LOW (ref 10.0–291.0)

## 2013-07-18 MED ORDER — LORATADINE 10 MG PO TABS: 10.0000 mg | ORAL_TABLET | Freq: Every day | ORAL | Status: DC

## 2013-07-18 MED ORDER — OMEPRAZOLE 40 MG PO CPDR: 40.0000 mg | DELAYED_RELEASE_CAPSULE | Freq: Every day | ORAL | Status: DC

## 2013-07-18 MED ORDER — HYDROCOD POLST-CHLORPHEN POLST 10-8 MG/5ML PO LQCR: 5.0000 mL | Freq: Two times a day (BID) | ORAL | Status: DC | PRN

## 2013-07-18 NOTE — Progress Notes (Signed)
  Subjective:     Deanna Shields is a 80 y.o. female here for evaluation of a cough. Onset of symptoms was several years ago. Symptoms have been unchanged since that time. The cough is dry and nonproductive and is aggravated by nothing. Associated symptoms include: none. Patient does not have a history of asthma. Patient does not have a history of environmental allergens. Patient has not traveled recently. Patient does not have a history of smoking. Patient has had a previous chest x-ray. Patient has not had a PPD done.  She has had GI and pulm workup.   Pt had esophageal stricture and hiatal hernia. She has not been taking PPI.  The following portions of the patient's history were reviewed and updated as appropriate: allergies, current medications, past family history, past medical history, past social history, past surgical history and problem list.  Review of Systems Pertinent items are noted in HPI.    Objective:    Oxygen saturation 97% on room air BP 122/70  Pulse 75  Temp(Src) 98.1 F (36.7 C) (Oral)  Wt 161 lb (73.029 kg)  SpO2 97% General appearance: alert, cooperative, appears stated age and no distress Ears: normal TM's and external ear canals both ears Throat: lips, mucosa, and tongue normal; teeth and gums normal Neck: no adenopathy, supple, symmetrical, trachea midline and thyroid not enlarged, symmetric, no tenderness/mass/nodules Lungs: clear to auscultation bilaterally Heart: S1, S2 normal    Assessment:    Cough    Plan:    Antitussives per medication orders. Call if shortness of breath worsens, blood in sputum, change in character of cough, development of fever or chills, inability to maintain nutrition and hydration. Avoid exposure to tobacco smoke and fumes. restart PPI and antihistamine

## 2013-07-18 NOTE — Progress Notes (Signed)
Pre visit review using our clinic review tool, if applicable. No additional management support is needed unless otherwise documented below in the visit note. 

## 2013-07-18 NOTE — Telephone Encounter (Signed)
Was advised by Vita Barley our lab tech that the patient was sent to the lab by her PCP in order to collect UDS for narcotic cough med. Patient went to lab and advised the lab tech that "her insurance did not pay for this test and she would not be providing [place] with another urine sample again." I was told by the lab tech at this point, the patient picked up her prescription and walked out of the lab. I have attempted to contact the patient regarding this interaction, she was not home at the time of my call so I left a message with her husband for the patient to return my call.

## 2013-07-18 NOTE — Patient Instructions (Signed)

## 2013-07-19 LAB — LDL CHOLESTEROL, DIRECT: LDL DIRECT: 161.2 mg/dL

## 2013-07-19 NOTE — Telephone Encounter (Signed)
Spoke with patient and advised of UDS policy and that she is not responsible for any charges not covered by her insurance plan. Patient verbalized understanding as is now agreeable to submit UDS on future visits.

## 2013-08-31 ENCOUNTER — Other Ambulatory Visit: Payer: Self-pay | Admitting: Family Medicine

## 2013-08-31 DIAGNOSIS — L570 Actinic keratosis: Secondary | ICD-10-CM | POA: Diagnosis not present

## 2013-08-31 DIAGNOSIS — D235 Other benign neoplasm of skin of trunk: Secondary | ICD-10-CM | POA: Diagnosis not present

## 2013-08-31 DIAGNOSIS — L820 Inflamed seborrheic keratosis: Secondary | ICD-10-CM | POA: Diagnosis not present

## 2013-10-01 ENCOUNTER — Telehealth: Payer: Self-pay | Admitting: Family Medicine

## 2013-10-01 DIAGNOSIS — R053 Chronic cough: Secondary | ICD-10-CM

## 2013-10-01 DIAGNOSIS — R05 Cough: Secondary | ICD-10-CM

## 2013-10-01 MED ORDER — HYDROCOD POLST-CHLORPHEN POLST 10-8 MG/5ML PO LQCR: 5.0000 mL | Freq: Two times a day (BID) | ORAL | Status: DC | PRN

## 2013-10-01 NOTE — Telephone Encounter (Signed)
Rx sent to [facility] on [address].     KP

## 2013-10-01 NOTE — Telephone Encounter (Signed)
Last seen and filled 07/18/13. Please advise      KP

## 2013-10-01 NOTE — Telephone Encounter (Signed)
Patient aware Rx ready for pick up at the office       KP

## 2013-10-01 NOTE — Telephone Encounter (Signed)
Caller name:Chanise  Relation to PO:[id number] Call back [phone] Pharmacy:[facility] [place], [place].  Reason for call: pt is needing a refill on RX chlorpheniramine-HYDROcodone (TUSSIONEX PENNKINETIC ER) 10-8 MG/5ML

## 2013-10-01 NOTE — Telephone Encounter (Signed)
Refill x1 

## 2013-10-01 NOTE — Addendum Note (Signed)
Addended by: Ewing Schlein on: 10/01/2013 09:57 AM   Modules accepted: Orders

## 2013-10-17 ENCOUNTER — Other Ambulatory Visit: Payer: Self-pay

## 2013-10-17 MED ORDER — HYDROCHLOROTHIAZIDE 25 MG PO TABS: 12.5000 mg | ORAL_TABLET | Freq: Every day | ORAL | Status: DC

## 2013-10-17 NOTE — Telephone Encounter (Signed)
Rx was sent to pharmacy electronically. Patient needs to call our office to schedule an appointment for future refills.

## 2013-10-18 ENCOUNTER — Other Ambulatory Visit: Payer: Self-pay | Admitting: *Deleted

## 2013-10-18 MED ORDER — HYDROCHLOROTHIAZIDE 25 MG PO TABS: 12.5000 mg | ORAL_TABLET | Freq: Every day | ORAL | Status: DC

## 2013-10-18 NOTE — Telephone Encounter (Signed)
Rx was sent to pharmacy electronically. 

## 2013-10-19 ENCOUNTER — Ambulatory Visit: Payer: Self-pay | Admitting: Family Medicine

## 2013-10-19 ENCOUNTER — Other Ambulatory Visit: Payer: Self-pay

## 2013-10-19 ENCOUNTER — Emergency Department (HOSPITAL_COMMUNITY)
Admission: EM | Admit: 2013-10-19 | Discharge: 2013-10-19 | Disposition: A | Discharge status: Home / Self Care | Payer: Medicare Other | Attending: Emergency Medicine | Admitting: Emergency Medicine

## 2013-10-19 ENCOUNTER — Telehealth: Payer: Self-pay | Admitting: Family Medicine

## 2013-10-19 ENCOUNTER — Emergency Department (HOSPITAL_COMMUNITY): Payer: Medicare Other

## 2013-10-19 ENCOUNTER — Encounter (HOSPITAL_COMMUNITY): Payer: Self-pay | Admitting: Emergency Medicine

## 2013-10-19 DIAGNOSIS — I10 Essential (primary) hypertension: Secondary | ICD-10-CM | POA: Diagnosis not present

## 2013-10-19 DIAGNOSIS — R42 Dizziness and giddiness: Secondary | ICD-10-CM | POA: Diagnosis present

## 2013-10-19 DIAGNOSIS — M546 Pain in thoracic spine: Secondary | ICD-10-CM | POA: Insufficient documentation

## 2013-10-19 DIAGNOSIS — Z8709 Personal history of other diseases of the respiratory system: Secondary | ICD-10-CM | POA: Diagnosis not present

## 2013-10-19 DIAGNOSIS — R918 Other nonspecific abnormal finding of lung field: Secondary | ICD-10-CM | POA: Diagnosis not present

## 2013-10-19 DIAGNOSIS — R55 Syncope and collapse: Secondary | ICD-10-CM | POA: Diagnosis not present

## 2013-10-19 DIAGNOSIS — M129 Arthropathy, unspecified: Secondary | ICD-10-CM | POA: Insufficient documentation

## 2013-10-19 DIAGNOSIS — Z7982 Long term (current) use of aspirin: Secondary | ICD-10-CM | POA: Diagnosis not present

## 2013-10-19 DIAGNOSIS — R61 Generalized hyperhidrosis: Secondary | ICD-10-CM | POA: Insufficient documentation

## 2013-10-19 DIAGNOSIS — K219 Gastro-esophageal reflux disease without esophagitis: Secondary | ICD-10-CM | POA: Insufficient documentation

## 2013-10-19 DIAGNOSIS — Z79899 Other long term (current) drug therapy: Secondary | ICD-10-CM | POA: Insufficient documentation

## 2013-10-19 LAB — URINALYSIS, ROUTINE W REFLEX MICROSCOPIC
Bilirubin Urine: NEGATIVE
Glucose, UA: NEGATIVE mg/dL
Hgb urine dipstick: NEGATIVE
Ketones, ur: NEGATIVE mg/dL
LEUKOCYTES UA: NEGATIVE
Nitrite: NEGATIVE
PH: 6 (ref 5.0–8.0)
Protein, ur: NEGATIVE mg/dL
Specific Gravity, Urine: 1.012 (ref 1.005–1.030)
UROBILINOGEN UA: 0.2 mg/dL (ref 0.0–1.0)

## 2013-10-19 LAB — CBC
HCT: 40.9 % (ref 36.0–46.0)
HEMOGLOBIN: 14 g/dL (ref 12.0–15.0)
MCH: 31 pg (ref 26.0–34.0)
MCHC: 34.2 g/dL (ref 30.0–36.0)
MCV: 90.5 fL (ref 78.0–100.0)
Platelets: 257 10*3/uL (ref 150–400)
RBC: 4.52 MIL/uL (ref 3.87–5.11)
RDW: 17.9 % — ABNORMAL HIGH (ref 11.5–15.5)
WBC: 7.6 10*3/uL (ref 4.0–10.5)

## 2013-10-19 LAB — BASIC METABOLIC PANEL
BUN: 34 mg/dL — ABNORMAL HIGH (ref 6–23)
CHLORIDE: 97 meq/L (ref 96–112)
CO2: 27 mEq/L (ref 19–32)
Calcium: 10.1 mg/dL (ref 8.4–10.5)
Creatinine, Ser: 1.07 mg/dL (ref 0.50–1.10)
GFR calc Af Amer: 54 mL/min — ABNORMAL LOW (ref >90)
GFR calc non Af Amer: 47 mL/min — ABNORMAL LOW (ref >90)
GLUCOSE: 100 mg/dL — AB (ref 70–99)
POTASSIUM: 4.4 meq/L (ref 3.7–5.3)
Sodium: 136 mEq/L — ABNORMAL LOW (ref 137–147)

## 2013-10-19 LAB — I-STAT TROPONIN, ED: Troponin i, poc: 0 ng/mL (ref 0.00–0.08)

## 2013-10-19 MED ORDER — SODIUM CHLORIDE 0.9 % IV BOLUS (SEPSIS)
1000.0000 mL | INTRAVENOUS | Status: AC
  Administered 2013-10-19: 1000 mL via INTRAVENOUS

## 2013-10-19 NOTE — ED Notes (Signed)
Pt states that she was shopping at [employer] yesterday when she had sudden onset of mid thoracic back pain w/ nausea, diaphoresis, and near syncope.  States the pain lasted a few minutes and went away. States that since then, she has felt "washed out" and not feeling well.  Pt lives at [facility] but was brought in by private vehicle by her husband.

## 2013-10-19 NOTE — ED Provider Notes (Signed)
CSN: [id number]     Arrival date & time 10/19/13  1103 History   First MD Initiated Contact with Patient 10/19/13 1113     Chief Complaint  Patient presents with  . Dizziness  . Back Pain  . Near Syncope     (Consider location/radiation/quality/duration/timing/severity/associated sxs/prior Treatment) Patient is a 80 y.o. female presenting with dizziness, back pain, and near-syncope. The history is provided by the patient.  Dizziness Quality:  Unable to specify Severity:  Mild Onset quality:  Sudden Duration:  1 day Timing:  Constant Progression:  Improving Chronicity:  New Context comment:  At rest Relieved by:  Nothing Worsened by:  Nothing tried Ineffective treatments:  None tried Associated symptoms: no chest pain, no diarrhea, no headaches, no nausea, no shortness of breath and no vomiting   Back Pain Location:  Thoracic spine Quality:  Aching Radiates to:  Does not radiate Pain severity:  Mild Onset quality:  Sudden Duration: 15 min now gone since yesterday. Timing: once. Progression:  Resolved Chronicity:  New Associated symptoms: no abdominal pain, no chest pain, no dysuria, no fever and no headaches   Near Syncope Pertinent negatives include no chest pain, no abdominal pain, no headaches and no shortness of breath.    Past Medical History  Diagnosis Date  . Hypertension   . Rhinitis, allergic   . Hemorrhoids 2004    Colonoscopy  . Hiatal hernia 2004    EGD  . Esophageal stricture 2009    EGD   . GERD (gastroesophageal reflux disease) 2004    EGD  . Arthritis    Past Surgical History  Procedure Laterality Date  . Abdominal hysterectomy    . Hemorroidectomy    . Knee arthroscopy Bilateral     right x 2  . Carpal tunnel release Right    Family History  Problem Relation Age of Onset  . Stroke Father 62    light stroke  . Heart disease Sister   . Coronary artery disease Neg Hx   . Diabetes Neg Hx   . Cancer Neg Hx     breast, colon, prostate    History  Substance Use Topics  . Smoking status: Never Smoker   . Smokeless tobacco: Never Used  . Alcohol Use: No   OB History   Grav Para Term Preterm Abortions TAB SAB Ect Mult Living                 Review of Systems  Constitutional: Positive for diaphoresis. Negative for fever and fatigue.  HENT: Negative for congestion and drooling.   Eyes: Negative for pain.  Respiratory: Negative for cough and shortness of breath.   Cardiovascular: Positive for near-syncope. Negative for chest pain.  Gastrointestinal: Negative for nausea, vomiting, abdominal pain and diarrhea.  Genitourinary: Negative for dysuria and hematuria.  Musculoskeletal: Positive for back pain. Negative for gait problem and neck pain.  Skin: Negative for color change.  Neurological: Positive for dizziness. Negative for headaches.  Hematological: Negative for adenopathy.  Psychiatric/Behavioral: Negative for behavioral problems.  All other systems reviewed and are negative.     Allergies  Codeine  Home Medications   Prior to Admission medications   Medication Sig Start Date End Date Taking? Authorizing Provider  aspirin 81 MG tablet Take 81 mg by mouth daily.    Yes Historical Provider, MD  calcium carbonate (OS-CAL) 600 MG TABS Take 1,200 mg by mouth daily.    Yes Historical Provider, MD  chlorpheniramine-HYDROcodone Amanda Cockayne [facility]) 10-8  MG/5ML LQCR Take 5 mLs by mouth every 12 (twelve) hours as needed. 10/01/13  Yes Rosalita Chessman, DO  cholecalciferol (VITAMIN D) 1000 UNITS tablet Take 1,000 Units by mouth daily.     Yes Historical Provider, MD  Ferrous Sulfate (IRON) 325 (65 FE) MG TABS Take 1 tablet by mouth daily.   Yes Historical Provider, MD  hydrochlorothiazide (HYDRODIURIL) 25 MG tablet Take 12.5 mg by mouth daily.   Yes Historical Provider, MD  loratadine (CLARITIN) 10 MG tablet Take 1 tablet (10 mg total) by mouth daily. 07/18/13  Yes Yvonne R Lowne, DO  losartan (COZAAR) 100 MG tablet  Take 100 mg by mouth daily.   Yes Historical Provider, MD  omeprazole (PRILOSEC) 40 MG capsule Take 1 capsule (40 mg total) by mouth daily. 07/18/13  Yes Yvonne R Lowne, DO  potassium gluconate 595 MG TABS tablet Take 595 mg by mouth daily.   Yes Historical Provider, MD  vitamin B-12 (CYANOCOBALAMIN) 500 MCG tablet Take 1,000 mcg by mouth daily.   Yes Historical Provider, MD   BP 173/99  Pulse 73  Temp(Src) 98.5 F (36.9 C) (Oral)  Resp 18  SpO2 100% Physical Exam  Nursing note and vitals reviewed. Constitutional: She is oriented to person, place, and time. She appears well-developed and well-nourished.  BP's equal in bilateral UE's.  HENT:  Head: Normocephalic and atraumatic.  Mouth/Throat: Oropharynx is clear and moist. No oropharyngeal exudate.  Eyes: Conjunctivae and EOM are normal. Pupils are equal, round, and reactive to light.  Neck: Normal range of motion. Neck supple.  Cardiovascular: Normal rate, regular rhythm, normal heart sounds and intact distal pulses.  Exam reveals no gallop and no friction rub.   No murmur heard. Pulmonary/Chest: Effort normal and breath sounds normal. No respiratory distress. She has no wheezes.  Abdominal: Soft. Bowel sounds are normal. There is no tenderness. There is no rebound and no guarding.  Musculoskeletal: Normal range of motion. She exhibits no edema and no tenderness.  Neurological: She is alert and oriented to person, place, and time.  alert, oriented x3 speech: normal in context and clarity memory: intact grossly cranial nerves II-XII: intact, normal peripheral fields motor strength: full proximally and distally no involuntary movements or tremors sensation: intact to light touch diffusely  cerebellar: finger-to-nose and heel-to-shin intact gait: normal forwards and backwards, normal tandem gait   Skin: Skin is warm and dry.  Psychiatric: She has a normal mood and affect. Her behavior is normal.    ED Course  Procedures  (including critical care time) Labs Review Labs Reviewed  CBC - Abnormal; Notable for the following:    RDW 17.9 (*)    All other components within normal limits  BASIC METABOLIC PANEL - Abnormal; Notable for the following:    Sodium 136 (*)    Glucose, Bld 100 (*)    BUN 34 (*)    GFR calc non Af Amer 47 (*)    GFR calc Af Amer 54 (*)    All other components within normal limits  URINALYSIS, ROUTINE W REFLEX MICROSCOPIC  I-STAT TROPOININ, ED    Imaging Review Dg Chest 2 View  10/19/2013   CLINICAL DATA:  Dizziness, blurred vision, nausea, backache, history hypertension, GERD  EXAM: CHEST  2 VIEW  COMPARISON:  08/10/2012 ; correlation CT chest 03/29/2013  FINDINGS: Upper-normal size of cardiac silhouette.  Mediastinal contours and pulmonary vascularity normal.  Hyperinflation without acute infiltrate, pleural effusion or pneumothorax.  Bones demineralized.  IMPRESSION: Chronic hyperinflation  without acute infiltrate.   Electronically Signed   By: Lavonia Dana M.D.   On: 10/19/2013 12:36   Ct Head Wo Contrast  10/19/2013   CLINICAL DATA:  Dizziness and near syncope.  EXAM: CT HEAD WITHOUT CONTRAST  TECHNIQUE: Contiguous axial images were obtained from the base of the skull through the vertex without intravenous contrast.  COMPARISON:  None.  FINDINGS: There is no evidence of acute cortical infarct, intracranial hemorrhage, mass, midline shift, or extra-axial fluid collection. Periventricular white-matter hypodensities are nonspecific but compatible with moderate chronic small vessel ischemic disease. Age-related cerebral atrophy is present. Orbits are unremarkable. Mastoid air cells and visualized paranasal sinuses are clear.  IMPRESSION: 1. No evidence of acute intracranial abnormality. 2. Moderate chronic small vessel ischemic disease.   Electronically Signed   By: Logan Bores   On: 10/19/2013 12:19     EKG Interpretation   Date/Time:  Friday Oct 19 2013 11:16:51 EDT Ventricular Rate:   72 PR Interval:  145 QRS Duration: 96 QT Interval:  399 QTC Calculation: 437 R Axis:   2 Text Interpretation:  Sinus rhythm Baseline wander in lead(s) V6 No  significant change since last tracing Confirmed by Keldan Eplin  MD, Comfort  ([id number]) on 10/19/2013 12:06:12 PM      MDM   Final diagnoses:  Near syncope  Dizziness    11:45 AM 80 y.o. female who presents with a near syncopal episode which occurred yesterday. She states that she was walking and eating her outlets when she suddenly felt dizzy and had some mid back pain. She had some blurry vision and diaphoresis as well. She denies any chest pain or shortness of breath. She states her symptoms lasted approximately 15 minutes and then resolved. She does have some very mild residual dizziness which has persisted today. She is otherwise asymptomatic and appears well on exam. She is afebrile and mildly hypertensive here. She is a normal neurologic exam. Will get screening labs and imaging.  1:31 PM:  I interpreted/reviewed the labs and/or imaging which were non-contributory. Pt continues to appear well. I have discussed the diagnosis/risks/treatment options with the patient and believe the pt to be eligible for discharge home to follow-up with her pcp next week for repeat evaluation and monitoring of BP. We also discussed returning to the ED immediately if new or worsening sx occur. We discussed the sx which are most concerning (e.g., return of back pain, cp, sob, fever, worsening dizziness, weakness, numbness) that necessitate immediate return. Medications administered to the patient during their visit and any new prescriptions provided to the patient are listed below.  Medications given during this visit Medications  sodium chloride 0.9 % bolus 1,000 mL (1,000 mLs Intravenous New Bag/Given 10/19/13 1150)    New Prescriptions   No medications on file     Blanchard Kelch, MD 10/19/13 1333

## 2013-10-19 NOTE — Telephone Encounter (Signed)
Patient Information:  Caller Name: Deanna Shields  Phone: [phone]  Patient: Deanna Shields  Gender: Female  DOB: 24-Oct-1928  Age: 80 Years  PCP: Rosalita Chessman.  Office Follow Up:  Does the office need to follow up with this patient?: No  Instructions For The Office: N/A   Symptoms  Reason For Call & Symptoms: Pt calling regarding dizzy episodes and not "feeling well...washed out". NO other sx. BP is a bit elevated 140/90 per RN at [facility].  Reviewed Health History In EMR: Yes  Reviewed Medications In EMR: Yes  Reviewed Allergies In EMR: Yes  Reviewed Surgeries / Procedures: Yes  Date of Onset of Symptoms: 10/18/2013  Treatments Tried: rest  Treatments Tried Worked: No  Guideline(s) Used:  Dizziness  Disposition Per Guideline:   Go to Office Now  Reason For Disposition Reached:   Lightheadedness (dizziness) present now, after 2 hours of rest and fluids  Advice Given:  Temporary Dizziness  is usually a harmless symptom. It can be caused by not drinking enough water during sports or hot weather. It can also be caused by skipping a meal, too much sun exposure, standing up suddenly, standing too long in one place or even a viral illness.  Some Causes of Temporary Dizziness:  Poor Fluid Intake - Not drinking enough fluids and being a little dehydrated is a common cause of temporary dizziness. This is always worse during hot weather.  Temporary Dizziness  is usually a harmless symptom. It can be caused by not drinking enough water during sports or hot weather. It can also be caused by skipping a meal, too much sun exposure, standing up suddenly, standing too long in one place or even a viral illness.  Drink Fluids:  Drink several glasses of fruit juice, other clear fluids, or water. This will improve hydration and blood glucose. If you have a fever or have had heat exposure, make sure the fluids are cold.  Rest for 1-2 Hours:  Lie down with feet elevated for 1 hour. This  will improve blood flow and increase blood flow to the brain.  Stand Up Slowly:  In the mornings, sit up for a few minutes before you stand up. That will help your blood flow make the adjustment.  If you have to stand up for long periods of time, contract and relax your leg muscles to help pump the blood back to the heart.  Call Back If:  Still feel dizzy after 2 hours of rest and fluids  Passes out (faints)  You become worse.  Patient Will Follow Care Advice:  YES  Appointment Scheduled:  10/19/2013 12:30:00 Appointment Scheduled Provider:  Elsie Stain

## 2013-10-19 NOTE — ED Notes (Signed)
Bed: [id number] Expected date:  Expected time:  Means of arrival:  Comments: ems- 80 yo, chest pain x1 week with cough

## 2013-10-19 NOTE — Discharge Instructions (Signed)

## 2013-10-22 ENCOUNTER — Ambulatory Visit: Payer: Medicare Other | Admitting: Nurse Practitioner

## 2013-10-25 ENCOUNTER — Encounter: Payer: Self-pay | Admitting: Family Medicine

## 2013-10-25 ENCOUNTER — Ambulatory Visit (INDEPENDENT_AMBULATORY_CARE_PROVIDER_SITE_OTHER): Payer: Medicare Other | Admitting: Family Medicine

## 2013-10-25 VITALS — BP 134/86 | HR 87 | Temp 98.4°F | Wt 153.0 lb

## 2013-10-25 DIAGNOSIS — R079 Chest pain, unspecified: Secondary | ICD-10-CM | POA: Diagnosis not present

## 2013-10-25 DIAGNOSIS — R42 Dizziness and giddiness: Secondary | ICD-10-CM

## 2013-10-25 NOTE — Progress Notes (Signed)
   Subjective:    Patient ID: Deanna Shields, female    DOB: 01-08-1929, 80 y.o.   MRN: [id number]  HPI Pt here to f/u hospital / ER f/u.  Pt was at [employer] on Thursday and had an episode of cp and dizziness.  She sat to rest and it went away.  Friday early am she woke up with dizziness, NVD but no chest pain.  She called ems to go to ER.   See er chart.     Review of Systems As above    Objective:   Physical Exam  BP 134/86  Pulse 87  Temp(Src) 98.4 F (36.9 C) (Oral)  Wt 153 lb (69.4 kg)  SpO2 97% General appearance: alert, cooperative, appears stated age and no distress Nose: Nares normal. Septum midline. Mucosa normal. No drainage or sinus tenderness. Throat: lips, mucosa, and tongue normal; teeth and gums normal Neck: no adenopathy, supple, symmetrical, trachea midline and thyroid not enlarged, symmetric, no tenderness/mass/nodules Lungs: clear to auscultation bilaterally Heart: S1, S2 normal EKG: normal EKG, normal sinus rhythm, unchanged from previous tracings.  Er labs reviewed     Assessment & Plan:  1. Chest pain Non since last Thursday - Ambulatory referral to Cardiology  2. Dizzy Er eval reviewed-- everything essentially normal Pt had NVD for a few days with chest and back pain-- may have been viral but with chest and back pain pt will f/u with - Ambulatory referral to Cardiology

## 2013-10-25 NOTE — Progress Notes (Signed)
Pre visit review using our clinic review tool, if applicable. No additional management support is needed unless otherwise documented below in the visit note. 

## 2013-10-28 ENCOUNTER — Observation Stay (HOSPITAL_COMMUNITY)
Admission: EM | Admit: 2013-10-28 | Discharge: 2013-10-30 | Disposition: A | Discharge status: Home / Self Care | Payer: Medicare Other | Attending: Internal Medicine | Admitting: Internal Medicine

## 2013-10-28 ENCOUNTER — Emergency Department (HOSPITAL_COMMUNITY): Payer: Medicare Other

## 2013-10-28 ENCOUNTER — Encounter (HOSPITAL_COMMUNITY): Payer: Self-pay | Admitting: Emergency Medicine

## 2013-10-28 DIAGNOSIS — Z7982 Long term (current) use of aspirin: Secondary | ICD-10-CM | POA: Diagnosis not present

## 2013-10-28 DIAGNOSIS — R110 Nausea: Secondary | ICD-10-CM | POA: Diagnosis not present

## 2013-10-28 DIAGNOSIS — R0789 Other chest pain: Secondary | ICD-10-CM | POA: Diagnosis not present

## 2013-10-28 DIAGNOSIS — R1011 Right upper quadrant pain: Secondary | ICD-10-CM

## 2013-10-28 DIAGNOSIS — R059 Cough, unspecified: Secondary | ICD-10-CM

## 2013-10-28 DIAGNOSIS — R05 Cough: Secondary | ICD-10-CM

## 2013-10-28 DIAGNOSIS — M549 Dorsalgia, unspecified: Secondary | ICD-10-CM

## 2013-10-28 DIAGNOSIS — M542 Cervicalgia: Secondary | ICD-10-CM

## 2013-10-28 DIAGNOSIS — R252 Cramp and spasm: Secondary | ICD-10-CM

## 2013-10-28 DIAGNOSIS — M899 Disorder of bone, unspecified: Secondary | ICD-10-CM

## 2013-10-28 DIAGNOSIS — Z8709 Personal history of other diseases of the respiratory system: Secondary | ICD-10-CM | POA: Diagnosis not present

## 2013-10-28 DIAGNOSIS — B356 Tinea cruris: Secondary | ICD-10-CM

## 2013-10-28 DIAGNOSIS — R0602 Shortness of breath: Secondary | ICD-10-CM

## 2013-10-28 DIAGNOSIS — Z885 Allergy status to narcotic agent status: Secondary | ICD-10-CM | POA: Diagnosis not present

## 2013-10-28 DIAGNOSIS — H6120 Impacted cerumen, unspecified ear: Secondary | ICD-10-CM

## 2013-10-28 DIAGNOSIS — R42 Dizziness and giddiness: Secondary | ICD-10-CM

## 2013-10-28 DIAGNOSIS — R5381 Other malaise: Secondary | ICD-10-CM | POA: Insufficient documentation

## 2013-10-28 DIAGNOSIS — E538 Deficiency of other specified B group vitamins: Secondary | ICD-10-CM

## 2013-10-28 DIAGNOSIS — I10 Essential (primary) hypertension: Secondary | ICD-10-CM | POA: Diagnosis not present

## 2013-10-28 DIAGNOSIS — M129 Arthropathy, unspecified: Secondary | ICD-10-CM | POA: Insufficient documentation

## 2013-10-28 DIAGNOSIS — Z79899 Other long term (current) drug therapy: Secondary | ICD-10-CM | POA: Insufficient documentation

## 2013-10-28 DIAGNOSIS — K219 Gastro-esophageal reflux disease without esophagitis: Secondary | ICD-10-CM

## 2013-10-28 DIAGNOSIS — R5383 Other fatigue: Secondary | ICD-10-CM | POA: Diagnosis not present

## 2013-10-28 DIAGNOSIS — F430 Acute stress reaction: Secondary | ICD-10-CM

## 2013-10-28 DIAGNOSIS — R531 Weakness: Secondary | ICD-10-CM

## 2013-10-28 DIAGNOSIS — H9191 Unspecified hearing loss, right ear: Secondary | ICD-10-CM

## 2013-10-28 DIAGNOSIS — E559 Vitamin D deficiency, unspecified: Secondary | ICD-10-CM

## 2013-10-28 DIAGNOSIS — D519 Vitamin B12 deficiency anemia, unspecified: Secondary | ICD-10-CM | POA: Diagnosis present

## 2013-10-28 DIAGNOSIS — K449 Diaphragmatic hernia without obstruction or gangrene: Secondary | ICD-10-CM

## 2013-10-28 DIAGNOSIS — K648 Other hemorrhoids: Secondary | ICD-10-CM

## 2013-10-28 DIAGNOSIS — I739 Peripheral vascular disease, unspecified: Secondary | ICD-10-CM

## 2013-10-28 DIAGNOSIS — R609 Edema, unspecified: Secondary | ICD-10-CM

## 2013-10-28 DIAGNOSIS — J309 Allergic rhinitis, unspecified: Secondary | ICD-10-CM

## 2013-10-28 DIAGNOSIS — R300 Dysuria: Secondary | ICD-10-CM

## 2013-10-28 DIAGNOSIS — D509 Iron deficiency anemia, unspecified: Secondary | ICD-10-CM | POA: Diagnosis present

## 2013-10-28 DIAGNOSIS — R079 Chest pain, unspecified: Principal | ICD-10-CM

## 2013-10-28 DIAGNOSIS — R55 Syncope and collapse: Secondary | ICD-10-CM | POA: Diagnosis not present

## 2013-10-28 DIAGNOSIS — M949 Disorder of cartilage, unspecified: Secondary | ICD-10-CM

## 2013-10-28 LAB — CBC
HCT: 38.9 % (ref 36.0–46.0)
HEMOGLOBIN: 13.7 g/dL (ref 12.0–15.0)
MCH: 31.9 pg (ref 26.0–34.0)
MCHC: 35.2 g/dL (ref 30.0–36.0)
MCV: 90.5 fL (ref 78.0–100.0)
Platelets: 273 10*3/uL (ref 150–400)
RBC: 4.3 MIL/uL (ref 3.87–5.11)
RDW: 17.1 % — ABNORMAL HIGH (ref 11.5–15.5)
WBC: 7 10*3/uL (ref 4.0–10.5)

## 2013-10-28 LAB — BASIC METABOLIC PANEL
BUN: 18 mg/dL (ref 6–23)
CO2: 26 meq/L (ref 19–32)
Calcium: 9.6 mg/dL (ref 8.4–10.5)
Chloride: 95 mEq/L — ABNORMAL LOW (ref 96–112)
Creatinine, Ser: 0.98 mg/dL (ref 0.50–1.10)
GFR calc Af Amer: 60 mL/min — ABNORMAL LOW (ref >90)
GFR, EST NON AFRICAN AMERICAN: 52 mL/min — AB (ref >90)
GLUCOSE: 97 mg/dL (ref 70–99)
POTASSIUM: 3.8 meq/L (ref 3.7–5.3)
SODIUM: 135 meq/L — AB (ref 137–147)

## 2013-10-28 LAB — URINALYSIS, ROUTINE W REFLEX MICROSCOPIC
Bilirubin Urine: NEGATIVE
Glucose, UA: NEGATIVE mg/dL
Hgb urine dipstick: NEGATIVE
Ketones, ur: NEGATIVE mg/dL
Nitrite: NEGATIVE
Protein, ur: NEGATIVE mg/dL
Specific Gravity, Urine: 1.011 (ref 1.005–1.030)
Urobilinogen, UA: 0.2 mg/dL (ref 0.0–1.0)
pH: 7 (ref 5.0–8.0)

## 2013-10-28 LAB — URINE MICROSCOPIC-ADD ON

## 2013-10-28 LAB — I-STAT TROPONIN, ED: Troponin i, poc: 0 ng/mL (ref 0.00–0.08)

## 2013-10-28 LAB — TROPONIN I: Troponin I: <0.3 ng/mL

## 2013-10-28 LAB — TSH: TSH: 2.78 u[IU]/mL (ref 0.350–4.500)

## 2013-10-28 LAB — PRO B NATRIURETIC PEPTIDE: PRO B NATRI PEPTIDE: 30.8 pg/mL (ref 0–450)

## 2013-10-28 MED ORDER — ASPIRIN 81 MG PO TABS: 81.0000 mg | ORAL_TABLET | Freq: Every day | ORAL | Status: DC

## 2013-10-28 MED ORDER — HEPARIN SODIUM (PORCINE) 5000 UNIT/ML IJ SOLN
5000.0000 [IU] | Freq: Three times a day (TID) | INTRAMUSCULAR | Status: DC
Start: 2013-10-28 — End: 2013-10-30
  Administered 2013-10-28 – 2013-10-29 (×3): 5000 [IU] via SUBCUTANEOUS
  Filled 2013-10-28 (×9): qty 1

## 2013-10-28 MED ORDER — VITAMIN B-12 1000 MCG PO TABS
1000.0000 ug | ORAL_TABLET | Freq: Every day | ORAL | Status: DC
  Administered 2013-10-29 – 2013-10-30 (×2): 1000 ug via ORAL
  Filled 2013-10-28 (×2): qty 1

## 2013-10-28 MED ORDER — GI COCKTAIL ~~LOC~~: 30.0000 mL | Freq: Four times a day (QID) | ORAL | Status: DC | PRN

## 2013-10-28 MED ORDER — AMLODIPINE BESYLATE 10 MG PO TABS
10.0000 mg | ORAL_TABLET | Freq: Every day | ORAL | Status: DC
  Administered 2013-10-29 – 2013-10-30 (×2): 10 mg via ORAL
  Filled 2013-10-28 (×3): qty 1

## 2013-10-28 MED ORDER — MORPHINE SULFATE 2 MG/ML IJ SOLN: 2.0000 mg | INTRAMUSCULAR | Status: DC | PRN

## 2013-10-28 MED ORDER — LOSARTAN POTASSIUM 50 MG PO TABS
100.0000 mg | ORAL_TABLET | Freq: Every day | ORAL | Status: DC
  Administered 2013-10-29 – 2013-10-30 (×2): 100 mg via ORAL
  Filled 2013-10-28 (×2): qty 2

## 2013-10-28 MED ORDER — SODIUM CHLORIDE 0.9 % IV BOLUS (SEPSIS)
1000.0000 mL | Freq: Once | INTRAVENOUS | Status: AC
  Administered 2013-10-28: 1000 mL via INTRAVENOUS

## 2013-10-28 MED ORDER — VITAMIN D3 25 MCG (1000 UNIT) PO TABS
1000.0000 [IU] | ORAL_TABLET | Freq: Every day | ORAL | Status: DC
  Administered 2013-10-29 – 2013-10-30 (×2): 1000 [IU] via ORAL
  Filled 2013-10-28 (×2): qty 1

## 2013-10-28 MED ORDER — CALCIUM CARBONATE 600 MG PO TABS: 1200.0000 mg | ORAL_TABLET | Freq: Every day | ORAL | Status: DC

## 2013-10-28 MED ORDER — ASPIRIN 81 MG PO CHEW
324.0000 mg | CHEWABLE_TABLET | Freq: Once | ORAL | Status: AC
  Administered 2013-10-28: 324 mg via ORAL
  Filled 2013-10-28: qty 4

## 2013-10-28 MED ORDER — ASPIRIN EC 81 MG PO TBEC
81.0000 mg | DELAYED_RELEASE_TABLET | Freq: Every day | ORAL | Status: DC
Start: 2013-10-29 — End: 2013-10-30
  Administered 2013-10-29 – 2013-10-30 (×2): 81 mg via ORAL
  Filled 2013-10-28 (×2): qty 1

## 2013-10-28 MED ORDER — VITAMIN B-12 500 MCG PO TABS: 1000.0000 ug | ORAL_TABLET | Freq: Every day | ORAL | Status: DC

## 2013-10-28 MED ORDER — HYDRALAZINE HCL 20 MG/ML IJ SOLN: 10.0000 mg | Freq: Four times a day (QID) | INTRAMUSCULAR | Status: DC | PRN

## 2013-10-28 MED ORDER — CALCIUM CARBONATE 1250 (500 CA) MG PO TABS
2.0000 | ORAL_TABLET | Freq: Every day | ORAL | Status: DC
  Administered 2013-10-29 – 2013-10-30 (×2): 1000 mg via ORAL
  Filled 2013-10-28 (×3): qty 2

## 2013-10-28 MED ORDER — SODIUM CHLORIDE 0.9 % IV SOLN
INTRAVENOUS | Status: AC
  Administered 2013-10-28: 20:00:00 via INTRAVENOUS

## 2013-10-28 MED ORDER — PANTOPRAZOLE SODIUM 40 MG PO TBEC
40.0000 mg | DELAYED_RELEASE_TABLET | Freq: Every day | ORAL | Status: DC
Start: 2013-10-28 — End: 2013-10-30
  Administered 2013-10-30: 40 mg via ORAL
  Filled 2013-10-28: qty 1

## 2013-10-28 MED ORDER — CARVEDILOL 3.125 MG PO TABS
3.1250 mg | ORAL_TABLET | Freq: Two times a day (BID) | ORAL | Status: DC
  Administered 2013-10-29: 3.125 mg via ORAL
  Filled 2013-10-28 (×6): qty 1

## 2013-10-28 NOTE — Consult Note (Signed)
Reason for Consult: Chest Pain  Requesting Physician: Dr Candiss Norse  HPI: Deanna Shields is an 80 year old married Caucasian female with no children patient of mine and Dr. Festus Aloe. Her cardiac risk factor profile is remarkable for treated hypertension. She has never had a heart attack or stroke. She had a normal cardiac catheterization perform by Dr. Rex Kras  approximately 10 years ago. She had a negative Myoview stress test within the last 2 years. She has not felt good over the last 2 weeks with complaints for weakness. She developed substernal chest pain with radiation to her left shoulder and back at 1:00 this afternoon and was brought to [facility] emergency room. Her EKG showed no acute changes. The chest pain resolved spontaneously.  Problem List: Patient Active Problem List   Diagnosis Date Noted  . Chest pain 10/28/2013  . Dizziness 10/19/2013  . Near syncope 10/19/2013  . Leg cramps 12/25/2012  . Bilateral claudication of lower limb 12/25/2012  . Stress reaction 09/15/2012  . Hearing loss of right ear 07/31/2012  . ABDOMINAL PAIN, RIGHT UPPER QUADRANT 06/18/2010  . TINEA CRURIS 10/29/2009  . CERUMEN IMPACTION, RIGHT 07/28/2009  . NECK PAIN, RIGHT 07/28/2009  . DIZZINESS 07/28/2009  . UNSPECIFIED VITAMIN D DEFICIENCY 06/16/2009  . B12 DEFICIENCY 04/01/2009  . Shortness of breath 02/03/2009  . DYSURIA 06/04/2008  . INTERNAL HEMORRHOIDS 04/22/2008  . HIATAL HERNIA 04/22/2008  . GERD 03/21/2008  . Allergic rhinitis, cause unspecified 08/10/2007  . OSTEOPENIA 03/22/2007  . BACKACHE NOS 02/23/2007  . COUGH, CHRONIC 02/23/2007  . HYPERTENSION 11/17/2006  . DEPENDENT EDEMA 11/17/2006    PMHx:  Past Medical History  Diagnosis Date  . Hypertension   . Rhinitis, allergic   . Hemorrhoids 2004    Colonoscopy  . Hiatal hernia 2004    EGD  . Esophageal stricture 2009    EGD   . GERD (gastroesophageal reflux disease) 2004    EGD  . Arthritis    Past  Surgical History  Procedure Laterality Date  . Abdominal hysterectomy    . Hemorroidectomy    . Knee arthroscopy Bilateral     right x 2  . Carpal tunnel release Right     FAMHx: Family History  Problem Relation Age of Onset  . Stroke Father 34    light stroke  . Heart disease Sister   . Coronary artery disease Neg Hx   . Diabetes Neg Hx   . Cancer Neg Hx     breast, colon, prostate    SOCHx:  reports that she has never smoked. She has never used smokeless tobacco. She reports that she does not drink alcohol or use illicit drugs.  ALLERGIES: Allergies  Allergen Reactions  . Codeine Nausea And Vomiting    in large doses: can tolerate Tussionex    ROS: A comprehensive review of systems was negative.  HOME MEDICATIONS: Prescriptions prior to admission  Medication Sig Dispense Refill  . aspirin 81 MG tablet Take 81 mg by mouth daily.       . calcium carbonate (OS-CAL) 600 MG TABS Take 1,200 mg by mouth daily.       . chlorpheniramine-HYDROcodone (TUSSIONEX PENNKINETIC ER) 10-8 MG/5ML LQCR Take 5 mLs by mouth every 12 (twelve) hours as needed.  140 mL  0  . cholecalciferol (VITAMIN D) 1000 UNITS tablet Take 1,000 Units by mouth daily.        . Ferrous Sulfate (IRON) 325 (65 FE) MG TABS Take  1 tablet by mouth daily.      . hydrochlorothiazide (HYDRODIURIL) 25 MG tablet Take 12.5 mg by mouth daily.      [name] loratadine (CLARITIN) 10 MG tablet Take 1 tablet (10 mg total) by mouth daily.  90 tablet  3  . losartan (COZAAR) 100 MG tablet Take 100 mg by mouth daily.      [name] omeprazole (PRILOSEC) 40 MG capsule Take 1 capsule (40 mg total) by mouth daily.  90 capsule  3  . potassium gluconate 595 MG TABS tablet Take 595 mg by mouth daily.      . vitamin B-12 (CYANOCOBALAMIN) 500 MCG tablet Take 1,000 mcg by mouth daily.        HOSPITAL MEDICATIONS: I have reviewed the patient's current medications.  VITALS: Blood pressure 162/88, pulse 78, temperature 97.8 F (36.6 C),  temperature source Oral, resp. rate 14, SpO2 97.00%.  INPUT/OUTPUT        PHYSICAL EXAM: General appearance: alert and no distress Neck: no adenopathy, no carotid bruit, no JVD, supple, symmetrical, trachea midline and thyroid not enlarged, symmetric, no tenderness/mass/nodules Lungs: clear to auscultation bilaterally Heart: regular rate and rhythm, S1, S2 normal, no murmur, click, rub or gallop Abdomen: soft, non-tender; bowel sounds normal; no masses,  no organomegaly Extremities: extremities normal, atraumatic, no cyanosis or edema Pulses: 2+ and symmetric  LABS:  BMP  Recent Labs  07/18/13 1118 10/19/13 1116 10/28/13 1407  NA 140 136* 135*  K 4.0 4.4 3.8  CL 103 97 95*  CO2 28 27 26   GLUCOSE 92 100* 97  BUN 27* 34* 18  CREATININE 1.1 1.07 0.98  CALCIUM 9.6 10.1 9.6  GFRNONAA  --  47* 52*  GFRAA  --  54* 60*    CBC  Recent Labs Lab 10/28/13 1407  WBC 7.0  RBC 4.30  HGB 13.7  HCT 38.9  PLT 273  MCV 90.5    HEMOGLOBIN A1C No results found for this basename: HGBA1C, MPG    Cardiac Panel (last 3 results) No results found for this basename: CKTOTAL, CKMB, TROPONINI, RELINDX,  in the last 8760 hours  BNP (last 3 results)  Recent Labs  10/28/13 1407  PROBNP 30.8    TSH No results found for this basename: TSH,  in the last 8760 hours  CHOLESTEROL  Recent Labs  12/25/12 1136 07/18/13 1118  CHOL 254* 225*    Hepatic Function Panel  Recent Labs  12/25/12 1136 07/18/13 1118  PROT 7.1 7.1  ALBUMIN 3.9 4.0  AST 22 27  ALT 16 17  ALKPHOS 72 71  BILITOT 0.5 0.7  BILIDIR 0.0 0.0    IMAGING: Dg Chest 2 View  10/28/2013   CLINICAL DATA:  Dizziness.  Lightheadedness.  EXAM: CHEST  2 VIEW  COMPARISON:  Chest radiograph 10/19/2013.  FINDINGS: Stable cardiac and mediastinal contours. No consolidative pulmonary opacities. No pleural effusion or pneumothorax. Mid thoracic spine degenerative change.  IMPRESSION: No acute cardiopulmonary process.    Electronically Signed   By: Lovey Newcomer M.D.   On: 10/28/2013 14:56    EGG: NSR w/o STTWC  IMPRESSION: 1. Hypertension: She is on losartan and hydrochlorothiazide at home. Her blood pressure is mildly elevated today  2    Chest pain: Patient had an episode of chest pain today worrisome for unstable angina. She has minimal cardiac risk factors, normal cath 10 years ago and normal Myoview within the last 2 years. She is currently pain-free. Her EKG shows no acute changes.  RECOMMENDATION: 1. Admit, rule out myocardial infarction. Cycle enzymes. Full-strength Lovenox. She'll need a positive Myoview stress test tomorrow to risk stratify her.  Time Spent Directly with Patient: 45 minutes  [name] 10/28/2013, 5:00 PM

## 2013-10-28 NOTE — H&P (Signed)
Patient Demographics  Deanna Shields, is a 80 y.o. female  MRN: [id number]   DOB - 02/14/1929  Admit Date - 10/28/2013  Outpatient Primary MD for the patient is Garnet Koyanagi, DO   With History of -  Past Medical History  Diagnosis Date  . Hypertension   . Rhinitis, allergic   . Hemorrhoids 2004    Colonoscopy  . Hiatal hernia 2004    EGD  . Esophageal stricture 2009    EGD   . GERD (gastroesophageal reflux disease) 2004    EGD  . Arthritis       Past Surgical History  Procedure Laterality Date  . Abdominal hysterectomy    . Hemorroidectomy    . Knee arthroscopy Bilateral     right x 2  . Carpal tunnel release Right     in for   Chief Complaint  Patient presents with  . Chest Pain  . Dizziness     HPI  Deanna Shields  is a 80 y.o. female, history of hypertension, GERD, hiatal hernia, arthritis, chronic edema in both lower extremities who was in good state of health until about 2 weeks ago when she was shopping at the mall and developed sudden onset of chest and back pain lasting for about 20 minutes, made her dizzy and short of breath, she said down and the pain went away, she presented to [facility] long ER where initial workup was unremarkable however her blood pressure at that time was high and she was sent home. Since then patient has felt fatigued, is getting easily short of breath, this afternoon while she was sitting she developed sudden substernal chest tightness radiating to her left shoulder making her again dizzy and short of breath, she then came to [facility] where her initial workup including lab work, troponin I stat, EKG and chest x-ray are unremarkable but again her blood pressure was noted to be slightly high. I was called to evaluate the patient for chest pain workup.    Review of  Systems currently negative review of systems    In addition to the HPI above,  No Fever-chills, No Headache, No changes with Vision or hearing, No problems swallowing food or Liquids, No Chest pain, Cough or Shortness of Breath, No Abdominal pain, No Nausea or Vommitting, Bowel movements are regular, No Blood in stool or Urine, No dysuria, No new skin rashes or bruises, No new joints pains-aches,  No new weakness, tingling, numbness in any extremity, No recent weight gain or loss, No polyuria, polydypsia or polyphagia, No significant Mental Stressors.  A full 10 point Review of Systems was done, except as stated above, all other Review of Systems were negative.   Social History History  Substance Use Topics  . Smoking status: Never Smoker   . Smokeless tobacco: Never Used  . Alcohol Use: No      Family History Family History  Problem Relation Age of Onset  .  Stroke Father 66    light stroke  . Heart disease Sister   . Coronary artery disease Neg Hx   . Diabetes Neg Hx   . Cancer Neg Hx     breast, colon, prostate     Prior to Admission medications   Medication Sig Start Date End Date Taking? Authorizing Provider  aspirin 81 MG tablet Take 81 mg by mouth daily.    Yes Historical Provider, MD  calcium carbonate (OS-CAL) 600 MG TABS Take 1,200 mg by mouth daily.    Yes Historical Provider, MD  chlorpheniramine-HYDROcodone (TUSSIONEX PENNKINETIC ER) 10-8 MG/5ML LQCR Take 5 mLs by mouth every 12 (twelve) hours as needed. 10/01/13  Yes Rosalita Chessman, DO  cholecalciferol (VITAMIN D) 1000 UNITS tablet Take 1,000 Units by mouth daily.     Yes Historical Provider, MD  Ferrous Sulfate (IRON) 325 (65 FE) MG TABS Take 1 tablet by mouth daily.   Yes Historical Provider, MD  hydrochlorothiazide (HYDRODIURIL) 25 MG tablet Take 12.5 mg by mouth daily.   Yes Historical Provider, MD  loratadine (CLARITIN) 10 MG tablet Take 1 tablet (10 mg total) by mouth daily. 07/18/13  Yes Yvonne R  Lowne, DO  losartan (COZAAR) 100 MG tablet Take 100 mg by mouth daily.   Yes Historical Provider, MD  omeprazole (PRILOSEC) 40 MG capsule Take 1 capsule (40 mg total) by mouth daily. 07/18/13  Yes Yvonne R Lowne, DO  potassium gluconate 595 MG TABS tablet Take 595 mg by mouth daily.   Yes Historical Provider, MD  vitamin B-12 (CYANOCOBALAMIN) 500 MCG tablet Take 1,000 mcg by mouth daily.   Yes Historical Provider, MD    Allergies  Allergen Reactions  . Codeine Nausea And Vomiting    in large doses: can tolerate Tussionex    Physical Exam  Vitals  Blood pressure 172/72, pulse 74, temperature 97.8 F (36.6 C), temperature source Oral, resp. rate 14, SpO2 99.00%.   1. General elderly white female lying in bed in NAD,     2. Normal affect and insight, Not Suicidal or Homicidal, Awake Alert, Oriented X 3.  3. No F.N deficits, ALL C.Nerves Intact, Strength 5/5 all 4 extremities, Sensation intact all 4 extremities, Plantars down going.  4. Ears and Eyes appear Normal, Conjunctivae clear, PERRLA. Moist Oral Mucosa.  5. Supple Neck, No JVD, No cervical lymphadenopathy appriciated, No Carotid Bruits.  6. Symmetrical Chest wall movement, Good air movement bilaterally, CTAB.  7. RRR, No Gallops, Rubs or Murmurs, No Parasternal Heave.  8. Positive Bowel Sounds, Abdomen Soft, No tenderness, No organomegaly appriciated,No rebound -guarding or rigidity.  9.  No Cyanosis, Normal Skin Turgor, No Skin Rash or Bruise. Chronic 1+ bilateral leg edema  10. Good muscle tone,  joints appear normal , no effusions, Normal ROM.  11. No Palpable Lymph Nodes in Neck or Axillae     Data Review  CBC  Recent Labs Lab 10/28/13 1407  WBC 7.0  HGB 13.7  HCT 38.9  PLT 273  MCV 90.5  MCH 31.9  MCHC 35.2  RDW 17.1*   ------------------------------------------------------------------------------------------------------------------  Chemistries   Recent Labs Lab 10/28/13 1407  NA 135*  K  3.8  CL 95*  CO2 26  GLUCOSE 97  BUN 18  CREATININE 0.98  CALCIUM 9.6   ------------------------------------------------------------------------------------------------------------------ CrCl is unknown because both a height and weight (above a minimum accepted value) are required for this calculation. ------------------------------------------------------------------------------------------------------------------ No results found for this basename: TSH, T4TOTAL, FREET3, T3FREE, THYROIDAB,  in  the last 72 hours   Coagulation profile No results found for this basename: INR, PROTIME,  in the last 168 hours ------------------------------------------------------------------------------------------------------------------- No results found for this basename: DDIMER,  in the last 72 hours -------------------------------------------------------------------------------------------------------------------  Cardiac Enzymes No results found for this basename: CK, CKMB, TROPONINI, MYOGLOBIN,  in the last 168 hours ------------------------------------------------------------------------------------------------------------------ No components found with this basename: POCBNP,    ---------------------------------------------------------------------------------------------------------------  Urinalysis    Component Value Date/Time   COLORURINE YELLOW 10/19/2013 [place] 10/19/2013 1212   LABSPEC 1.012 10/19/2013 1212   PHURINE 6.0 10/19/2013 [place] 10/19/2013 [place] 10/19/2013 1212   HGBUR negative 06/04/2008 [place] 10/19/2013 1212   BILIRUBINUR Neg 12/27/2012 [place] 10/19/2013 [place] 10/19/2013 1212   PROTEINUR Neg 12/27/2012 1046   UROBILINOGEN 0.2 10/19/2013 1212   UROBILINOGEN 0.2 12/27/2012 1046   NITRITE NEGATIVE 10/19/2013 1212   NITRITE Neg 12/27/2012 [place]  10/19/2013 1212    ----------------------------------------------------------------------------------------------------------------  Imaging results:   Dg Chest 2 View  10/28/2013   CLINICAL DATA:  Dizziness.  Lightheadedness.  EXAM: CHEST  2 VIEW  COMPARISON:  Chest radiograph 10/19/2013.  FINDINGS: Stable cardiac and mediastinal contours. No consolidative pulmonary opacities. No pleural effusion or pneumothorax. Mid thoracic spine degenerative change.  IMPRESSION: No acute cardiopulmonary process.   Electronically Signed   By: Lovey Newcomer M.D.   On: 10/28/2013 14:56      Ct Head Wo Contrast  10/19/2013   CLINICAL DATA:  Dizziness and near syncope.  EXAM: CT HEAD WITHOUT CONTRAST  TECHNIQUE: Contiguous axial images were obtained from the base of the skull through the vertex without intravenous contrast.  COMPARISON:  None.  FINDINGS: There is no evidence of acute cortical infarct, intracranial hemorrhage, mass, midline shift, or extra-axial fluid collection. Periventricular white-matter hypodensities are nonspecific but compatible with moderate chronic small vessel ischemic disease. Age-related cerebral atrophy is present. Orbits are unremarkable. Mastoid air cells and visualized paranasal sinuses are clear.  IMPRESSION: 1. No evidence of acute intracranial abnormality. 2. Moderate chronic small vessel ischemic disease.   Electronically Signed   By: Logan Bores   On: 10/19/2013 12:19    My personal review of EKG: Rhythm NSR, Rate  75 /min,  no Acute ST changes    Assessment & Plan   1. Chest pain and recurrent in a 80 year old patient with history of hypertension - story is at least moderately suspicious for ACS, her heart score according to our protocol is at least 4, will be kept on a telemetry bed for 23 hour observation, will cycle troponin, will check echogram to evaluate EF and wall motion, cardiologist Dr.Nishan been consulted for further evaluation and possible stress test. Also  monitor orthostatics. N.p.o. after midnight. Although her blood pressure is on the higher side but I do not think is enough to explain her symptoms.    2. Ongoing fatigue for 2 weeks. Workup as in #1 above, will also check a UA, TSH and B12.    3. Essential hypertension. Poorly controlled. We'll continue home dose ARB, will add low-dose Coreg and Norvasc along with as needed IV hydralazine.    4. Mild hypochloremia and borderline low sodium. She is on diuretic and could be mildly dehydrated, gentle normal saline infusion, hold HCTZ repeat BMP in the morning.    5. GERD. Continue PPI.    6. She of iron deficiency  anemia. On iron supplements, no acute issues.    7. Straight of low B12. Continue oral B12 supplementation. Recheck a B12 level.      DVT Prophylaxis Heparin    AM Labs Ordered, also please review Full Orders  Family Communication: Admission, patients condition and plan of care including tests being ordered have been discussed with the patient and husband who indicate understanding and agree with the plan and Code Status.  Code Status Full  Likely DC to Home  Condition Fair  Time spent in minutes : 35    Thurnell Lose M.D on 10/28/2013 at 4:37 PM  Between 7am to 7pm - Pager - [phone]  After 7pm go to [email or web address] - password [email or web address]  And look for the night coverage person covering me after hours  [facility] Office  [phone]   **Disclaimer: This note may have been dictated with voice recognition software. Similar sounding words can inadvertently be transcribed and this note may contain transcription errors which may not have been corrected upon publication of note.**

## 2013-10-28 NOTE — ED Notes (Signed)
EKG completed in Triage at 1313 by staff.

## 2013-10-28 NOTE — ED Provider Notes (Signed)
CSN: [id number]     Arrival date & time 10/28/13  1306 History   First MD Initiated Contact with Patient 10/28/13 1343     Chief Complaint  Patient presents with  . Chest Pain  . Dizziness     (Consider location/radiation/quality/duration/timing/severity/associated sxs/prior Treatment) HPI Deanna Shields is a 80 y.o. female who presents emergency department complaining of intermittent chest pains, dizziness, nausea, weakness. Patient states her symptoms began over a week ago. She was seen in emergency department at [facility] long that time. She had negative CT head, chest x-ray, lab work. She also followed up with her primary care Dr. 4 days ago, and was told everything looked good. She was referred for an appointment with cardiologist. She states her appointment is not until June 1. She states her chest pain she is having, ankles. They're in the left chest radiating around left shoulder. States has associated dizziness which is worse with standing up or walking. Also reports generalized malaise and weakness. She states she did have nausea vomiting and diarrhea for approximately 3 hours 5 days ago. Denies any recently. He denies any new medications or over-the-counter supplements. He denies any fever or chills. She denies any urinary symptoms. She denies any current chest pain. She states she is feeling weak and dizzy at right now.  Past Medical History  Diagnosis Date  . Hypertension   . Rhinitis, allergic   . Hemorrhoids 2004    Colonoscopy  . Hiatal hernia 2004    EGD  . Esophageal stricture 2009    EGD   . GERD (gastroesophageal reflux disease) 2004    EGD  . Arthritis    Past Surgical History  Procedure Laterality Date  . Abdominal hysterectomy    . Hemorroidectomy    . Knee arthroscopy Bilateral     right x 2  . Carpal tunnel release Right    Family History  Problem Relation Age of Onset  . Stroke Father 16    light stroke  . Heart disease Sister   . Coronary artery  disease Neg Hx   . Diabetes Neg Hx   . Cancer Neg Hx     breast, colon, prostate   History  Substance Use Topics  . Smoking status: Never Smoker   . Smokeless tobacco: Never Used  . Alcohol Use: No   OB History   Grav Para Term Preterm Abortions TAB SAB Ect Mult Living                 Review of Systems  Constitutional: Positive for fatigue. Negative for fever and chills.  Respiratory: Positive for chest tightness and shortness of breath. Negative for cough.   Cardiovascular: Positive for chest pain. Negative for palpitations and leg swelling.  Gastrointestinal: Positive for nausea. Negative for vomiting, abdominal pain and diarrhea.  Genitourinary: Negative for dysuria, flank pain, vaginal bleeding, vaginal discharge, vaginal pain and pelvic pain.  Musculoskeletal: Negative for arthralgias, myalgias, neck pain and neck stiffness.  Skin: Negative for rash.  Neurological: Positive for dizziness, weakness and light-headedness. Negative for headaches.  All other systems reviewed and are negative.     Allergies  Codeine  Home Medications   Prior to Admission medications   Medication Sig Start Date End Date Taking? Authorizing Provider  aspirin 81 MG tablet Take 81 mg by mouth daily.     Historical Provider, MD  calcium carbonate (OS-CAL) 600 MG TABS Take 1,200 mg by mouth daily.     Historical Provider, MD  chlorpheniramine-HYDROcodone (TUSSIONEX PENNKINETIC ER) 10-8 MG/5ML LQCR Take 5 mLs by mouth every 12 (twelve) hours as needed. 10/01/13   Rosalita Chessman, DO  cholecalciferol (VITAMIN D) 1000 UNITS tablet Take 1,000 Units by mouth daily.      Historical Provider, MD  Ferrous Sulfate (IRON) 325 (65 FE) MG TABS Take 1 tablet by mouth daily.    Historical Provider, MD  hydrochlorothiazide (HYDRODIURIL) 25 MG tablet Take 12.5 mg by mouth daily.    Historical Provider, MD  loratadine (CLARITIN) 10 MG tablet Take 1 tablet (10 mg total) by mouth daily. 07/18/13   Rosalita Chessman, DO   losartan (COZAAR) 100 MG tablet Take 100 mg by mouth daily.    Historical Provider, MD  omeprazole (PRILOSEC) 40 MG capsule Take 1 capsule (40 mg total) by mouth daily. 07/18/13   Rosalita Chessman, DO  potassium gluconate 595 MG TABS tablet Take 595 mg by mouth daily.    Historical Provider, MD  vitamin B-12 (CYANOCOBALAMIN) 500 MCG tablet Take 1,000 mcg by mouth daily.    Historical Provider, MD   There were no vitals taken for this visit. Physical Exam  Nursing note and vitals reviewed. Constitutional: She appears well-developed and well-nourished. No distress.  HENT:  Head: Normocephalic.  Eyes: Conjunctivae are normal.  Neck: Neck supple.  Cardiovascular: Normal rate, regular rhythm and normal heart sounds.   Pulmonary/Chest: Effort normal and breath sounds normal. No respiratory distress. She has no wheezes. She has no rales.  Abdominal: Soft. Bowel sounds are normal. She exhibits no distension. There is no tenderness. There is no rebound.  Musculoskeletal: She exhibits no edema.  Neurological: She is alert.  Skin: Skin is warm and dry.  Psychiatric: She has a normal mood and affect. Her behavior is normal.    ED Course  Procedures (including critical care time) Labs Review Labs Reviewed  CBC - Abnormal; Notable for the following:    RDW 17.1 (*)    All other components within normal limits  BASIC METABOLIC PANEL - Abnormal; Notable for the following:    Sodium 135 (*)    Chloride 95 (*)    GFR calc non Af Amer 52 (*)    GFR calc Af Amer 60 (*)    All other components within normal limits  PRO B NATRIURETIC PEPTIDE  I-STAT TROPOININ, ED    Imaging Review Dg Chest 2 View  10/28/2013   CLINICAL DATA:  Dizziness.  Lightheadedness.  EXAM: CHEST  2 VIEW  COMPARISON:  Chest radiograph 10/19/2013.  FINDINGS: Stable cardiac and mediastinal contours. No consolidative pulmonary opacities. No pleural effusion or pneumothorax. Mid thoracic spine degenerative change.  IMPRESSION: No  acute cardiopulmonary process.   Electronically Signed   By: Lovey Newcomer M.D.   On: 10/28/2013 14:56     EKG Interpretation None      MDM   Final diagnoses:  Chest pain  Weakness  Dizziness    Pt with near syncopal episode a week and a half ago. Since then dizziness, weakness, intermittent chest pains. Will get labs, CXR, ECG. Fluids started.    4:31 PM Labs all unremarkable. Pt is not orthostatic. Ambulated with no problems. Normal neurological exam. Low concern for CVA given exam findings, negative CT a week ago. UA a week ago clear.  Concerning increased frequency of chest pain associated with dizziness, SOB, nausea. Pt's cardiology apt is not for another week. I believe pt will need admission for monitoring and further tests. Spoke with Dr.  belfi who agrees.   Discussed with [facility], will admit pt.   Discussed with Dr. Johnsie Cancel, cardiology, will follow while in the hospital.    Filed Vitals:   10/28/13 1530 10/28/13 1611 10/28/13 1613 10/28/13 1617  BP: 146/59 162/68 172/74 172/72  Pulse: 62 67 68 74  Temp:      TempSrc:      Resp: 14     SpO2: 99%          Caytlyn Evers A Tyric Rodeheaver, PA-C 10/28/13 1634

## 2013-10-28 NOTE — ED Notes (Signed)
Pt reports onset one week ago of dizziness and nausea. Was seen at [facility] a week ago for same, reports not feeling well since then. Still having dizziness, nausea, mid chest tightness and "just feels washed out." ekg done at triage, airway intact.

## 2013-10-28 NOTE — ED Provider Notes (Signed)
Medical screening examination/treatment/procedure(s) were conducted as a shared visit with non-physician practitioner(s) and myself.  I personally evaluated the patient during the encounter.   EKG Interpretation   Date/Time:  Sunday Oct 28 2013 13:13:11 EDT Ventricular Rate:  75 PR Interval:  146 QRS Duration: 80 QT Interval:  410 QTC Calculation: 457 R Axis:   59 Text Interpretation:  Normal sinus rhythm Normal ECG No significant change  since last tracing Confirmed by YAO  MD, DAVID ([id number]) on 10/28/2013  2:35:07 PM      Pt with near syncopal episode 2 weeks ago, since then, worsening generalized weakness, chest tightness radiating to left arm, SOB.  No ischemic changes on EKG, trop neg, will admit for further work up.  Malvin Johns, MD 10/28/13 [phone]

## 2013-10-29 DIAGNOSIS — I369 Nonrheumatic tricuspid valve disorder, unspecified: Secondary | ICD-10-CM | POA: Diagnosis not present

## 2013-10-29 DIAGNOSIS — R0602 Shortness of breath: Secondary | ICD-10-CM | POA: Diagnosis not present

## 2013-10-29 DIAGNOSIS — R42 Dizziness and giddiness: Secondary | ICD-10-CM | POA: Diagnosis not present

## 2013-10-29 DIAGNOSIS — R5381 Other malaise: Secondary | ICD-10-CM

## 2013-10-29 DIAGNOSIS — R1011 Right upper quadrant pain: Secondary | ICD-10-CM

## 2013-10-29 DIAGNOSIS — R5383 Other fatigue: Secondary | ICD-10-CM

## 2013-10-29 DIAGNOSIS — R079 Chest pain, unspecified: Secondary | ICD-10-CM | POA: Diagnosis not present

## 2013-10-29 DIAGNOSIS — I10 Essential (primary) hypertension: Secondary | ICD-10-CM | POA: Diagnosis not present

## 2013-10-29 DIAGNOSIS — M542 Cervicalgia: Secondary | ICD-10-CM

## 2013-10-29 LAB — URINE CULTURE

## 2013-10-29 LAB — BASIC METABOLIC PANEL
BUN: 12 mg/dL (ref 6–23)
CALCIUM: 8.7 mg/dL (ref 8.4–10.5)
CHLORIDE: 103 meq/L (ref 96–112)
CO2: 26 meq/L (ref 19–32)
CREATININE: 0.93 mg/dL (ref 0.50–1.10)
GFR calc Af Amer: 64 mL/min — ABNORMAL LOW (ref >90)
GFR calc non Af Amer: 55 mL/min — ABNORMAL LOW (ref >90)
GLUCOSE: 84 mg/dL (ref 70–99)
Potassium: 4.3 mEq/L (ref 3.7–5.3)
Sodium: 137 mEq/L (ref 137–147)

## 2013-10-29 LAB — VITAMIN B12: VITAMIN B 12: 1078 pg/mL — AB (ref 211–911)

## 2013-10-29 LAB — TROPONIN I
Troponin I: <0.3 ng/mL (ref <0.30)
Troponin I: <0.3 ng/mL (ref <0.30)

## 2013-10-29 NOTE — Progress Notes (Signed)
Subjective:  No CP over night but she did have some LUE pain  Objective:  Temp:  [97.7 F (36.5 C)-98 F (36.7 C)] 98 F (36.7 C) (05/25 0518) Pulse Rate:  [61-78] 72 (05/25 0645) Resp:  [14-15] 15 (05/25 0518) BP: (136-172)/(48-88) 143/70 mmHg (05/25 0645) SpO2:  [97 %-100 %] 98 % (05/25 0645) Weight:  [157 lb (71.215 kg)] 157 lb (71.215 kg) (05/24 1707) Weight change:   Intake/Output from previous day: 05/24 0701 - 05/25 0700 In: 825 [P.O.:120; I.V.:705] Out: 700 [Urine:700]  Intake/Output from this shift:    Physical Exam: General appearance: alert and no distress Neck: no adenopathy, no carotid bruit, no JVD, supple, symmetrical, trachea midline and thyroid not enlarged, symmetric, no tenderness/mass/nodules Lungs: clear to auscultation bilaterally Heart: regular rate and rhythm, S1, S2 normal, no murmur, click, rub or gallop Extremities: extremities normal, atraumatic, no cyanosis or edema  Lab Results: Results for orders placed during the hospital encounter of 10/28/13 (from the past 48 hour(s))  CBC     Status: Abnormal   Collection Time    10/28/13  2:07 PM      Result Value Ref Range   WBC 7.0  4.0 - 10.5 K/uL   RBC 4.30  3.87 - 5.11 MIL/uL   Hemoglobin 13.7  12.0 - 15.0 g/dL   HCT 38.9  36.0 - 46.0 %   MCV 90.5  78.0 - 100.0 fL   MCH 31.9  26.0 - 34.0 pg   MCHC 35.2  30.0 - 36.0 g/dL   RDW 17.1 (*) 11.5 - 15.5 %   Platelets 273  150 - 400 K/uL  BASIC METABOLIC PANEL     Status: Abnormal   Collection Time    10/28/13  2:07 PM      Result Value Ref Range   Sodium 135 (*) 137 - 147 mEq/L   Potassium 3.8  3.7 - 5.3 mEq/L   Chloride 95 (*) 96 - 112 mEq/L   CO2 26  19 - 32 mEq/L   Glucose, Bld 97  70 - 99 mg/dL   BUN 18  6 - 23 mg/dL   Creatinine, Ser 0.98  0.50 - 1.10 mg/dL   Calcium 9.6  8.4 - 10.5 mg/dL   GFR calc non Af Amer 52 (*) >90 mL/min   GFR calc Af Amer 60 (*) >90 mL/min   Comment: (NOTE)     The eGFR has been calculated using the  CKD EPI equation.     This calculation has not been validated in all clinical situations.     eGFR's persistently <90 mL/min signify possible Chronic Kidney     Disease.  PRO B NATRIURETIC PEPTIDE     Status: None   Collection Time    10/28/13  2:07 PM      Result Value Ref Range   Pro B Natriuretic peptide (BNP) 30.8  0 - 450 pg/mL  I-STAT TROPOININ, ED     Status: None   Collection Time    10/28/13  2:23 PM      Result Value Ref Range   Troponin i, poc 0.00  0.00 - 0.08 ng/mL   Comment 3            Comment: Due to the release kinetics of cTnI,     a negative result within the first hours     of the onset of symptoms does not rule out     myocardial infarction with certainty.  If myocardial infarction is still suspected,     repeat the test at appropriate intervals.  TSH     Status: None   Collection Time    10/28/13  6:00 PM      Result Value Ref Range   TSH 2.780  0.350 - 4.500 uIU/mL   Comment: Please note change in reference range.  VITAMIN B12     Status: Abnormal   Collection Time    10/28/13  6:00 PM      Result Value Ref Range   Vitamin B-12 1078 (*) 211 - 911 pg/mL   Comment: Performed at [facility]  TROPONIN I     Status: None   Collection Time    10/28/13  6:00 PM      Result Value Ref Range   Troponin I <0.30  <0.30 ng/mL   Comment:            Due to the release kinetics of cTnI,     a negative result within the first hours     of the onset of symptoms does not rule out     myocardial infarction with certainty.     If myocardial infarction is still suspected,     repeat the test at appropriate intervals.  URINALYSIS, ROUTINE W REFLEX MICROSCOPIC     Status: Abnormal   Collection Time    10/28/13  8:00 PM      Result Value Ref Range   Color, Urine YELLOW  YELLOW   APPearance CLOUDY (*) CLEAR   Specific Gravity, Urine 1.011  1.005 - 1.030   pH 7.0  5.0 - 8.0   Glucose, UA NEGATIVE  NEGATIVE mg/dL   Hgb urine dipstick NEGATIVE  NEGATIVE    Bilirubin Urine NEGATIVE  NEGATIVE   Ketones, ur NEGATIVE  NEGATIVE mg/dL   Protein, ur NEGATIVE  NEGATIVE mg/dL   Urobilinogen, UA 0.2  0.0 - 1.0 mg/dL   Nitrite NEGATIVE  NEGATIVE   Leukocytes, UA SMALL (*) NEGATIVE  URINE MICROSCOPIC-ADD ON     Status: None   Collection Time    10/28/13  8:00 PM      Result Value Ref Range   Squamous Epithelial / LPF RARE  RARE   WBC, UA 3-6  <3 WBC/hpf   Bacteria, UA RARE  RARE  TROPONIN I     Status: None   Collection Time    10/28/13 11:07 PM      Result Value Ref Range   Troponin I <0.30  <0.30 ng/mL   Comment:            Due to the release kinetics of cTnI,     a negative result within the first hours     of the onset of symptoms does not rule out     myocardial infarction with certainty.     If myocardial infarction is still suspected,     repeat the test at appropriate intervals.  BASIC METABOLIC PANEL     Status: Abnormal   Collection Time    10/29/13  4:24 AM      Result Value Ref Range   Sodium 137  137 - 147 mEq/L   Potassium 4.3  3.7 - 5.3 mEq/L   Chloride 103  96 - 112 mEq/L   Comment: DELTA CHECK NOTED   CO2 26  19 - 32 mEq/L   Glucose, Bld 84  70 - 99 mg/dL   BUN 12  6 - 23 mg/dL  Creatinine, Ser 0.93  0.50 - 1.10 mg/dL   Calcium 8.7  8.4 - 10.5 mg/dL   GFR calc non Af Amer 55 (*) >90 mL/min   GFR calc Af Amer 64 (*) >90 mL/min   Comment: (NOTE)     The eGFR has been calculated using the CKD EPI equation.     This calculation has not been validated in all clinical situations.     eGFR's persistently <90 mL/min signify possible Chronic Kidney     Disease.  TROPONIN I     Status: None   Collection Time    10/29/13  4:24 AM      Result Value Ref Range   Troponin I <0.30  <0.30 ng/mL   Comment:            Due to the release kinetics of cTnI,     a negative result within the first hours     of the onset of symptoms does not rule out     myocardial infarction with certainty.     If myocardial infarction is still  suspected,     repeat the test at appropriate intervals.    Imaging: Imaging results have been reviewed  Tele: NSR  Assessment/Plan:   1. Principal Problem: 2.   Chest pain 3. Active Problems: 4.   B12 DEFICIENCY 5.   HYPERTENSION 6.   GERD 7.   Shortness of breath 8.   Bilateral claudication of lower limb 9.   Near syncope 10.   Time Spent Directly with Patient:  20 minutes  Length of Stay:  LOS: 1 day   Ruled out. Had episode of LUE pain last PM. Exam benign. For Myoview today. If nl can be D/Cd home and F/U with me.   [name] 10/29/2013, 8:14 AM

## 2013-10-29 NOTE — Progress Notes (Signed)
  Echocardiogram 2D Echocardiogram has been performed.  Odin 10/29/2013, 9:26 AM

## 2013-10-29 NOTE — Progress Notes (Signed)
Patient ID: Deanna Shields  female  [id number]    DOB: 10-03-1928    DOA: 10/28/2013  PCP: Loreen Freud, DO  Assessment/Plan: Principal Problem:   Chest pain: Currently resolved, worrisome for unstable angina, per patient normal cardiac cath, last stress test in 2 years was negative - Cardiology consulted, 3 sets of cardiac enzymes have been negative - Recommend a 2-D echocardiogram - lexiscan stress test, will be done 05/26 - Currently on aspirin, beta blocker, losartan., Will check lipid panel  Active Problems:   B12 DEFICIENCY -Level normal, continue B12 replacement     HYPERTENSION - Currently stable    GERD - Continue PPI   Near-syncope with Shortness of breath: Currently stable, will follow 2-D echo and cardiology recommendations    DVT Prophylaxis:Heparin subcutaneous  Code Status:Full code  Family Communication:  Disposition:  Consultants:  Cardiology  Procedures:  2-D echo  Antibiotics:  None    Subjective: Patient seen and examined, denies any chest pain or shortness of breath at present, no acute issues this morning  Objective: Weight change:   Intake/Output Summary (Last 24 hours) at 10/29/13 1139 Last data filed at 10/29/13 0517  Gross per 24 hour  Intake    825 ml  Output    700 ml  Net    125 ml   Blood pressure 143/70, pulse 72, temperature 98 F (36.7 C), temperature source Oral, resp. rate 15, height 5\' 6"  (1.676 m), weight 71.215 kg (157 lb), SpO2 98.00%.  Physical Exam: General: Alert and awake, oriented x3, not in any acute distress. CVS: S1-S2 clear, no murmur rubs or gallops Chest: clear to auscultation bilaterally, no wheezing, rales or rhonchi Abdomen: soft nontender, nondistended, normal bowel sounds  Extremities: no cyanosis, clubbing or edema noted bilaterally Neuro: Cranial nerves II-XII intact, no focal neurological deficits  Lab Results: Basic Metabolic Panel:  Recent Labs Lab 10/28/13 1407  10/29/13 0424  NA 135* 137  K 3.8 4.3  CL 95* 103  CO2 26 26  GLUCOSE 97 84  BUN 18 12  CREATININE 0.98 0.93  CALCIUM 9.6 8.7   Liver Function Tests: No results found for this basename: AST, ALT, ALKPHOS, BILITOT, PROT, ALBUMIN,  in the last 168 hours No results found for this basename: LIPASE, AMYLASE,  in the last 168 hours No results found for this basename: AMMONIA,  in the last 168 hours CBC:  Recent Labs Lab 10/28/13 1407  WBC 7.0  HGB 13.7  HCT 38.9  MCV 90.5  PLT 273   Cardiac Enzymes:  Recent Labs Lab 10/28/13 1800 10/28/13 2307 10/29/13 0424  TROPONINI <0.30 <0.30 <0.30   BNP: No components found with this basename: POCBNP,  CBG: No results found for this basename: GLUCAP,  in the last 168 hours   Micro Results: No results found for this or any previous visit (from the past 240 hour(s)).  Studies/Results: Dg Chest 2 View  10/28/2013   CLINICAL DATA:  Dizziness.  Lightheadedness.  EXAM: CHEST  2 VIEW  COMPARISON:  Chest radiograph 10/19/2013.  FINDINGS: Stable cardiac and mediastinal contours. No consolidative pulmonary opacities. No pleural effusion or pneumothorax. Mid thoracic spine degenerative change.  IMPRESSION: No acute cardiopulmonary process.   Electronically Signed   By: Annia Belt M.D.   On: 10/28/2013 14:56   Dg Chest 2 View  10/19/2013   CLINICAL DATA:  Dizziness, blurred vision, nausea, backache, history hypertension, GERD  EXAM: CHEST  2 VIEW  COMPARISON:  08/10/2012 ; correlation CT  chest 03/29/2013  FINDINGS: Upper-normal size of cardiac silhouette.  Mediastinal contours and pulmonary vascularity normal.  Hyperinflation without acute infiltrate, pleural effusion or pneumothorax.  Bones demineralized.  IMPRESSION: Chronic hyperinflation without acute infiltrate.   Electronically Signed   By: Ulyses Southward M.D.   On: 10/19/2013 12:36   Ct Head Wo Contrast  10/19/2013   CLINICAL DATA:  Dizziness and near syncope.  EXAM: CT HEAD WITHOUT  CONTRAST  TECHNIQUE: Contiguous axial images were obtained from the base of the skull through the vertex without intravenous contrast.  COMPARISON:  None.  FINDINGS: There is no evidence of acute cortical infarct, intracranial hemorrhage, mass, midline shift, or extra-axial fluid collection. Periventricular white-matter hypodensities are nonspecific but compatible with moderate chronic small vessel ischemic disease. Age-related cerebral atrophy is present. Orbits are unremarkable. Mastoid air cells and visualized paranasal sinuses are clear.  IMPRESSION: 1. No evidence of acute intracranial abnormality. 2. Moderate chronic small vessel ischemic disease.   Electronically Signed   By: Sebastian Ache   On: 10/19/2013 12:19    Medications: Scheduled Meds: . amLODipine  10 mg Oral Daily  . aspirin EC  81 mg Oral Daily  . calcium carbonate  2 tablet Oral Q breakfast  . carvedilol  3.125 mg Oral BID WC  . cholecalciferol  1,000 Units Oral Daily  . heparin  5,000 Units Subcutaneous 3 times per day  . losartan  100 mg Oral Daily  . pantoprazole  40 mg Oral Daily  . vitamin B-12  1,000 mcg Oral Daily      LOS: 1 day   Morty Ortwein Jenna Luo M.D. [facility] 10/29/2013, 11:39 AM Pager: [phone]  If 7PM-7AM, please contact night-coverage [email or web address] Password [email or web address]  **Disclaimer: This note was dictated with voice recognition software. Similar sounding words can inadvertently be transcribed and this note may contain transcription errors which may not have been corrected upon publication of note.**

## 2013-10-29 NOTE — Progress Notes (Signed)
Nuc unable to accomodate on schedule today due to lack of dose available for treatment. D/w Dr. [name]. OK to proceed tomorrow. Will make NPO after midnight and change study to tomorrow. Izzabell Klasen PA-C

## 2013-10-30 ENCOUNTER — Observation Stay (HOSPITAL_COMMUNITY): Payer: Medicare Other

## 2013-10-30 DIAGNOSIS — R55 Syncope and collapse: Secondary | ICD-10-CM | POA: Diagnosis not present

## 2013-10-30 DIAGNOSIS — R42 Dizziness and giddiness: Secondary | ICD-10-CM | POA: Diagnosis not present

## 2013-10-30 DIAGNOSIS — R079 Chest pain, unspecified: Secondary | ICD-10-CM

## 2013-10-30 DIAGNOSIS — E559 Vitamin D deficiency, unspecified: Secondary | ICD-10-CM

## 2013-10-30 DIAGNOSIS — I10 Essential (primary) hypertension: Secondary | ICD-10-CM | POA: Diagnosis not present

## 2013-10-30 MED ORDER — REGADENOSON 0.4 MG/5ML IV SOLN
INTRAVENOUS | Status: AC
  Administered 2013-10-30: 0.4 mg via INTRAVENOUS
  Filled 2013-10-30: qty 5

## 2013-10-30 MED ORDER — TECHNETIUM TC 99M SESTAMIBI - CARDIOLITE
30.0000 | Freq: Once | INTRAVENOUS | Status: AC | PRN
  Administered 2013-10-30: 30 via INTRAVENOUS

## 2013-10-30 MED ORDER — TECHNETIUM TC 99M SESTAMIBI GENERIC - CARDIOLITE
10.0000 | Freq: Once | INTRAVENOUS | Status: AC | PRN
  Administered 2013-10-30: 10 via INTRAVENOUS

## 2013-10-30 MED ORDER — CARVEDILOL 3.125 MG PO TABS: 3.1250 mg | ORAL_TABLET | Freq: Two times a day (BID) | ORAL | Status: DC

## 2013-10-30 MED ORDER — REGADENOSON 0.4 MG/5ML IV SOLN
0.4000 mg | Freq: Once | INTRAVENOUS | Status: AC
  Administered 2013-10-30: 0.4 mg via INTRAVENOUS
  Filled 2013-10-30: qty 5

## 2013-10-30 NOTE — Progress Notes (Signed)
Patient ID: Deanna Shields  female  [id number]    DOB: 07/01/1928    DOA: 10/28/2013  PCP: Loreen Freud, DO  Assessment/Plan: Principal Problem:   Chest pain: Currently resolved, worrisome for unstable angina, per patient normal cardiac cath, last stress test in 2 years was negative - Cardiology was consulted, 3 sets of cardiac enzymes have been negative, ruled out for acute ACS. 2-D echo was done which showed EF of 55-60%, normal wall motion, no regional wall motion abnormalities. Cardiology recommended lexiscan stress test. Patient undergoing stress test today. - Currently on aspirin, beta blocker, losartan  Active Problems:   B12 DEFICIENCY -Level normal, continue B12 replacement     HYPERTENSION - Currently stable    GERD - Continue PPI   Near-syncope with Shortness of breath: Currently stable, 2-D echo with EF 55-60%   DVT Prophylaxis:Heparin subcutaneous  Code Status:Full code  Family Communication:  Disposition: DC home if stress test is negative  Consultants:  Cardiology  Procedures:  2-D echo  Antibiotics:  None    Subjective: No chest pain, shortness of breath or any fevers or chills, awaiting stress test today  Objective: Weight change: 0.14 kg (5 oz)  Intake/Output Summary (Last 24 hours) at 10/30/13 1130 Last data filed at 10/30/13 0440  Gross per 24 hour  Intake      0 ml  Output   1300 ml  Net  -1300 ml   Blood pressure 163/57, pulse 61, temperature 98 F (36.7 C), temperature source Oral, resp. rate 18, height 5\' 6"  (1.676 m), weight 71.355 kg (157 lb 5 oz), SpO2 98.00%.  Physical Exam: General: Alert and awake, oriented x3, not in any acute distress. CVS: S1-S2 clear, no murmur rubs or gallops Chest: clear to auscultation bilaterally, no wheezing, rales or rhonchi Abdomen: soft nontender, nondistended, normal bowel sounds  Extremities: no cyanosis, clubbing or edema noted bilaterally Neuro: Cranial nerves II-XII intact, no  focal neurological deficits  Lab Results: Basic Metabolic Panel:  Recent Labs Lab 10/28/13 1407 10/29/13 0424  NA 135* 137  K 3.8 4.3  CL 95* 103  CO2 26 26  GLUCOSE 97 84  BUN 18 12  CREATININE 0.98 0.93  CALCIUM 9.6 8.7   Liver Function Tests: No results found for this basename: AST, ALT, ALKPHOS, BILITOT, PROT, ALBUMIN,  in the last 168 hours No results found for this basename: LIPASE, AMYLASE,  in the last 168 hours No results found for this basename: AMMONIA,  in the last 168 hours CBC:  Recent Labs Lab 10/28/13 1407  WBC 7.0  HGB 13.7  HCT 38.9  MCV 90.5  PLT 273   Cardiac Enzymes:  Recent Labs Lab 10/28/13 1800 10/28/13 2307 10/29/13 0424  TROPONINI <0.30 <0.30 <0.30   BNP: No components found with this basename: POCBNP,  CBG: No results found for this basename: GLUCAP,  in the last 168 hours   Micro Results: Recent Results (from the past 240 hour(s))  URINE CULTURE     Status: None   Collection Time    10/28/13  8:00 PM      Result Value Ref Range Status   Specimen Description URINE, CLEAN CATCH   Final   Special Requests NONE   Final   Culture  Setup Time     Final   Value: 10/28/2013 20:29     Performed at [facility] Count     Final   Value: 40,000 COLONIES/ML     Performed  at [facility]     Final   Value: Multiple bacterial morphotypes present, none predominant. Suggest appropriate recollection if clinically indicated.     Performed at [facility]   Report Status 10/29/2013 FINAL   Final    Studies/Results: Dg Chest 2 View  10/28/2013   CLINICAL DATA:  Dizziness.  Lightheadedness.  EXAM: CHEST  2 VIEW  COMPARISON:  Chest radiograph 10/19/2013.  FINDINGS: Stable cardiac and mediastinal contours. No consolidative pulmonary opacities. No pleural effusion or pneumothorax. Mid thoracic spine degenerative change.  IMPRESSION: No acute cardiopulmonary process.   Electronically Signed   By: Annia Belt M.D.   On: 10/28/2013 14:56   Dg Chest 2 View  10/19/2013   CLINICAL DATA:  Dizziness, blurred vision, nausea, backache, history hypertension, GERD  EXAM: CHEST  2 VIEW  COMPARISON:  08/10/2012 ; correlation CT chest 03/29/2013  FINDINGS: Upper-normal size of cardiac silhouette.  Mediastinal contours and pulmonary vascularity normal.  Hyperinflation without acute infiltrate, pleural effusion or pneumothorax.  Bones demineralized.  IMPRESSION: Chronic hyperinflation without acute infiltrate.   Electronically Signed   By: Ulyses Southward M.D.   On: 10/19/2013 12:36   Ct Head Wo Contrast  10/19/2013   CLINICAL DATA:  Dizziness and near syncope.  EXAM: CT HEAD WITHOUT CONTRAST  TECHNIQUE: Contiguous axial images were obtained from the base of the skull through the vertex without intravenous contrast.  COMPARISON:  None.  FINDINGS: There is no evidence of acute cortical infarct, intracranial hemorrhage, mass, midline shift, or extra-axial fluid collection. Periventricular white-matter hypodensities are nonspecific but compatible with moderate chronic small vessel ischemic disease. Age-related cerebral atrophy is present. Orbits are unremarkable. Mastoid air cells and visualized paranasal sinuses are clear.  IMPRESSION: 1. No evidence of acute intracranial abnormality. 2. Moderate chronic small vessel ischemic disease.   Electronically Signed   By: Sebastian Ache   On: 10/19/2013 12:19    Medications: Scheduled Meds: . amLODipine  10 mg Oral Daily  . aspirin EC  81 mg Oral Daily  . calcium carbonate  2 tablet Oral Q breakfast  . carvedilol  3.125 mg Oral BID WC  . cholecalciferol  1,000 Units Oral Daily  . heparin  5,000 Units Subcutaneous 3 times per day  . losartan  100 mg Oral Daily  . pantoprazole  40 mg Oral Daily  . vitamin B-12  1,000 mcg Oral Daily      LOS: 2 days   Debroh Sieloff Jenna Luo M.D. [facility] 10/30/2013, 11:30 AM Pager: [phone]  If 7PM-7AM, please contact  night-coverage [email or web address] Password [email or web address]  **Disclaimer: This note was dictated with voice recognition software. Similar sounding words can inadvertently be transcribed and this note may contain transcription errors which may not have been corrected upon publication of note.**

## 2013-10-30 NOTE — Discharge Summary (Signed)
Physician Discharge Summary  Patient ID: Deanna Shields MRN: [id number] DOB/AGE: [birth year] 80 y.o.  Admit date: 10/28/2013 Discharge date: 10/30/2013  Primary Care Physician:  Garnet Koyanagi, DO  Discharge Diagnoses:   . Chest pain . Near syncope . Shortness of breath . Bilateral claudication of lower limb . HYPERTENSION . GERD . B12 DEFICIENCY   Consults:  Cardiology: Dr [name]      Allergies:   Allergies  Allergen Reactions  . Codeine Nausea And Vomiting    in large doses: can tolerate Tussionex     Discharge Medications:   Medication List         aspirin 81 MG tablet  Take 81 mg by mouth daily.     calcium carbonate 600 MG Tabs tablet  Commonly known as:  OS-CAL  Take 1,200 mg by mouth daily.     carvedilol 3.125 MG tablet  Commonly known as:  COREG  Take 1 tablet (3.125 mg total) by mouth 2 (two) times daily with a meal.     chlorpheniramine-HYDROcodone 10-8 MG/5ML Lqcr  Commonly known as:  TUSSIONEX PENNKINETIC ER  Take 5 mLs by mouth every 12 (twelve) hours as needed.     cholecalciferol 1000 UNITS tablet  Commonly known as:  VITAMIN D  Take 1,000 Units by mouth daily.     hydrochlorothiazide 25 MG tablet  Commonly known as:  HYDRODIURIL  Take 12.5 mg by mouth daily.     Iron 325 (65 FE) MG Tabs  Take 1 tablet by mouth daily.     loratadine 10 MG tablet  Commonly known as:  CLARITIN  Take 1 tablet (10 mg total) by mouth daily.     losartan 100 MG tablet  Commonly known as:  COZAAR  Take 100 mg by mouth daily.     omeprazole 40 MG capsule  Commonly known as:  PRILOSEC  Take 1 capsule (40 mg total) by mouth daily.     potassium gluconate 595 MG Tabs tablet  Take 595 mg by mouth daily.     vitamin B-12 500 MCG tablet  Commonly known as:  CYANOCOBALAMIN  Take 1,000 mcg by mouth daily.         Brief H and P: For complete details please refer to admission H and P, but in brief Deanna Shields is a 80 y.o. female, history of  hypertension, GERD, hiatal hernia, arthritis, chronic edema in both lower extremities who was in good state of health until about 2 weeks ago when she was shopping at the mall and developed sudden onset of chest and back pain lasting for about 20 minutes, made her dizzy and short of breath, she said down and the pain went away, she presented to [facility] long ER where initial workup was unremarkable however her blood pressure at that time was high and she was sent home. Since then patient has felt fatigued, is getting easily short of breath, this afternoon while she was sitting she developed sudden substernal chest tightness radiating to her left shoulder making her again dizzy and short of breath, she then came to [facility] [place] where her initial workup including lab work, troponin I stat, EKG and chest x-ray are unremarkable but again her blood pressure was noted to be slightly high   Hospital Course:   Chest pain: Currently resolved, initially worrisome for angina, per patient had normal cardiac cath, last stress test in 2 years was negative   Cardiology was consulted, 3 sets of cardiac enzymes have been  negative, ruled out for acute ACS. 2-D echo was done which showed EF of 55-60%, normal wall motion, no regional wall motion abnormalities. Cardiology recommended lexiscan stress test. Patient undergoing stress test today which showed EF 83% and no evidence of ischemia .  - Currently on aspirin, beta blocker, losartan   B12 DEFICIENCY  -Level normal, continue B12 replacement   HYPERTENSION : was some what high on admission 172/72 per h&p, coreg was added to he medications by cardiology. - Currently stable   GERD  - Continue PPI   Near-syncope with Shortness of breath: Currently stable, 2-D echo with EF 55-60%      Day of Discharge BP 135/59  Pulse 63  Temp(Src) 97.6 F (36.4 C) (Oral)  Resp 18  Ht 5\' 6"  (1.676 m)  Wt 71.355 kg (157 lb 5 oz)  BMI 25.40 kg/m2  SpO2 98%  Physical  Exam: General: Alert and awake oriented x3 not in any acute distress. HEENT: anicteric sclera, pupils reactive to light and accommodation CVS: S1-S2 clear no murmur rubs or gallops Chest: clear to auscultation bilaterally, no wheezing rales or rhonchi Abdomen: soft nontender, nondistended, normal bowel sounds Extremities: no cyanosis, clubbing or edema noted bilaterally Neuro: Cranial nerves II-XII intact, no focal neurological deficits   The results of significant diagnostics from this hospitalization (including imaging, microbiology, ancillary and laboratory) are listed below for reference.    LAB RESULTS: Basic Metabolic Panel:  Recent Labs Lab 10/28/13 1407 10/29/13 0424  NA 135* 137  K 3.8 4.3  CL 95* 103  CO2 26 26  GLUCOSE 97 84  BUN 18 12  CREATININE 0.98 0.93  CALCIUM 9.6 8.7   Liver Function Tests: No results found for this basename: AST, ALT, ALKPHOS, BILITOT, PROT, ALBUMIN,  in the last 168 hours No results found for this basename: LIPASE, AMYLASE,  in the last 168 hours No results found for this basename: AMMONIA,  in the last 168 hours CBC:  Recent Labs Lab 10/28/13 1407  WBC 7.0  HGB 13.7  HCT 38.9  MCV 90.5  PLT 273   Cardiac Enzymes:  Recent Labs Lab 10/28/13 2307 10/29/13 0424  TROPONINI <0.30 <0.30   BNP: No components found with this basename: POCBNP,  CBG: No results found for this basename: GLUCAP,  in the last 168 hours  Significant Diagnostic Studies:  Dg Chest 2 View  10/28/2013   CLINICAL DATA:  Dizziness.  Lightheadedness.  EXAM: CHEST  2 VIEW  COMPARISON:  Chest radiograph 10/19/2013.  FINDINGS: Stable cardiac and mediastinal contours. No consolidative pulmonary opacities. No pleural effusion or pneumothorax. Mid thoracic spine degenerative change.  IMPRESSION: No acute cardiopulmonary process.   Electronically Signed   By: Lovey Newcomer M.D.   On: 10/28/2013 14:56    2D ECHO: Study Conclusions  - Left ventricle: The cavity  size was normal. Wall thickness was normal. Systolic function was normal. The estimated ejection fraction was in the range of 55% to 60%. Wall motion was normal; there were no regional wall motion abnormalities. There was an increased relative contribution of atrial contraction to ventricular filling. Doppler parameters are consistent with abnormal left ventricular relaxation (grade 1 diastolic dysfunction). - Mitral valve: There was trivial regurgitation. - Right atrium: The atrium was mildly dilated. - Tricuspid valve: There was mild-moderate regurgitation. - Pulmonary arteries: PA peak pressure: 29 mm Hg (S).   Disposition and Follow-up:     Discharge Instructions   Diet - low sodium heart healthy    Complete  by:  As directed      Increase activity slowly    Complete by:  As directed             DISPOSITION: home  DIET: heart healthy diet   DISCHARGE FOLLOW-UP Follow-up Information   Follow up with [name], MD. Schedule an appointment as soon as possible for a visit in 2 weeks. (for hospital follow-up)    Specialty:  Cardiology   Contact information:   [address] [place] [address] [phone]       Follow up with Garnet Koyanagi, DO. Schedule an appointment as soon as possible for a visit in 2 weeks. (for hospital follow-up)    Specialty:  Family Medicine   Contact information:   [phone] [address] [phone]       Time spent on Discharge: 35 mins  Signed:   Mendel Corning M.D. [facility] 10/30/2013, 2:08 PM Pager: [phone]   **Disclaimer: This note was dictated with voice recognition software. Similar sounding words can inadvertently be transcribed and this note may contain transcription errors which may not have been corrected upon publication of note.**

## 2013-10-30 NOTE — Progress Notes (Signed)
Pts assessment unchanged from this am. D/c'd with husband via wheelchair to private vehicle.

## 2013-10-30 NOTE — Progress Notes (Signed)
Subjective:  Seen in echo lab for stress test. Doing well no complaints. She has had no further CP or LUE pain   Objective:  Temp:  [97.7 F (36.5 C)-98.4 F (36.9 C)] 98 F (36.7 C) (05/26 0437) Pulse Rate:  [61-69] 61 (05/26 0437) Resp:  [15-20] 18 (05/26 0437) BP: (129-160)/(54-73) 160/73 mmHg (05/26 1020) SpO2:  [97 %-99 %] 98 % (05/26 0437) Weight:  [157 lb 5 oz (71.355 kg)] 157 lb 5 oz (71.355 kg) (05/26 0437) Weight change: 5 oz (0.14 kg)  Intake/Output from previous day: 05/25 0701 - 05/26 0700 In: -  Out: 1300 [Urine:1300]  Intake/Output from this shift:    Physical Exam: General appearance: alert and no distress Neck: no adenopathy, no carotid bruit, no JVD, supple, symmetrical, trachea midline and thyroid not enlarged, symmetric, no tenderness/mass/nodules Lungs: clear to auscultation bilaterally Heart: regular rate and rhythm, S1, S2 normal, no murmur, click, rub or gallop Extremities: extremities normal, atraumatic, no cyanosis or edema  Lab Results: Results for orders placed during the hospital encounter of 10/28/13 (from the past 48 hour(s))  CBC     Status: Abnormal   Collection Time    10/28/13  2:07 PM      Result Value Ref Range   WBC 7.0  4.0 - 10.5 K/uL   RBC 4.30  3.87 - 5.11 MIL/uL   Hemoglobin 13.7  12.0 - 15.0 g/dL   HCT 38.9  36.0 - 46.0 %   MCV 90.5  78.0 - 100.0 fL   MCH 31.9  26.0 - 34.0 pg   MCHC 35.2  30.0 - 36.0 g/dL   RDW 17.1 (*) 11.5 - 15.5 %   Platelets 273  150 - 400 K/uL  BASIC METABOLIC PANEL     Status: Abnormal   Collection Time    10/28/13  2:07 PM      Result Value Ref Range   Sodium 135 (*) 137 - 147 mEq/L   Potassium 3.8  3.7 - 5.3 mEq/L   Chloride 95 (*) 96 - 112 mEq/L   CO2 26  19 - 32 mEq/L   Glucose, Bld 97  70 - 99 mg/dL   BUN 18  6 - 23 mg/dL   Creatinine, Ser 0.98  0.50 - 1.10 mg/dL   Calcium 9.6  8.4 - 10.5 mg/dL   GFR calc non Af Amer 52 (*) >90 mL/min   GFR calc Af Amer 60 (*) >90 mL/min   Comment: (NOTE)     The eGFR has been calculated using the CKD EPI equation.     This calculation has not been validated in all clinical situations.     eGFR's persistently <90 mL/min signify possible Chronic Kidney     Disease.  PRO B NATRIURETIC PEPTIDE     Status: None   Collection Time    10/28/13  2:07 PM      Result Value Ref Range   Pro B Natriuretic peptide (BNP) 30.8  0 - 450 pg/mL  I-STAT TROPOININ, ED     Status: None   Collection Time    10/28/13  2:23 PM      Result Value Ref Range   Troponin i, poc 0.00  0.00 - 0.08 ng/mL   Comment 3            Comment: Due to the release kinetics of cTnI,     a negative result within the first hours     of the onset of  symptoms does not rule out     myocardial infarction with certainty.     If myocardial infarction is still suspected,     repeat the test at appropriate intervals.  TSH     Status: None   Collection Time    10/28/13  6:00 PM      Result Value Ref Range   TSH 2.780  0.350 - 4.500 uIU/mL   Comment: Please note change in reference range.  VITAMIN B12     Status: Abnormal   Collection Time    10/28/13  6:00 PM      Result Value Ref Range   Vitamin B-12 1078 (*) 211 - 911 pg/mL   Comment: Performed at [facility]  TROPONIN I     Status: None   Collection Time    10/28/13  6:00 PM      Result Value Ref Range   Troponin I <0.30  <0.30 ng/mL   Comment:            Due to the release kinetics of cTnI,     a negative result within the first hours     of the onset of symptoms does not rule out     myocardial infarction with certainty.     If myocardial infarction is still suspected,     repeat the test at appropriate intervals.  URINE CULTURE     Status: None   Collection Time    10/28/13  8:00 PM      Result Value Ref Range   Specimen Description URINE, CLEAN CATCH     Special Requests NONE     Culture  Setup Time       Value: 10/28/2013 20:29     Performed at [facility]         Value: 40,000 COLONIES/ML     Performed at [facility]   Culture       Value: Multiple bacterial morphotypes present, none predominant. Suggest appropriate recollection if clinically indicated.     Performed at [facility]   Report Status 10/29/2013 FINAL    URINALYSIS, ROUTINE W REFLEX MICROSCOPIC     Status: Abnormal   Collection Time    10/28/13  8:00 PM      Result Value Ref Range   Color, Urine YELLOW  YELLOW   APPearance CLOUDY (*) CLEAR   Specific Gravity, Urine 1.011  1.005 - 1.030   pH 7.0  5.0 - 8.0   Glucose, UA NEGATIVE  NEGATIVE mg/dL   Hgb urine dipstick NEGATIVE  NEGATIVE   Bilirubin Urine NEGATIVE  NEGATIVE   Ketones, ur NEGATIVE  NEGATIVE mg/dL   Protein, ur NEGATIVE  NEGATIVE mg/dL   Urobilinogen, UA 0.2  0.0 - 1.0 mg/dL   Nitrite NEGATIVE  NEGATIVE   Leukocytes, UA SMALL (*) NEGATIVE  URINE MICROSCOPIC-ADD ON     Status: None   Collection Time    10/28/13  8:00 PM      Result Value Ref Range   Squamous Epithelial / LPF RARE  RARE   WBC, UA 3-6  <3 WBC/hpf   Bacteria, UA RARE  RARE  TROPONIN I     Status: None   Collection Time    10/28/13 11:07 PM      Result Value Ref Range   Troponin I <0.30  <0.30 ng/mL   Comment:            Due to the  release kinetics of cTnI,     a negative result within the first hours     of the onset of symptoms does not rule out     myocardial infarction with certainty.     If myocardial infarction is still suspected,     repeat the test at appropriate intervals.  BASIC METABOLIC PANEL     Status: Abnormal   Collection Time    10/29/13  4:24 AM      Result Value Ref Range   Sodium 137  137 - 147 mEq/L   Potassium 4.3  3.7 - 5.3 mEq/L   Chloride 103  96 - 112 mEq/L   Comment: DELTA CHECK NOTED   CO2 26  19 - 32 mEq/L   Glucose, Bld 84  70 - 99 mg/dL   BUN 12  6 - 23 mg/dL   Creatinine, Ser 0.93  0.50 - 1.10 mg/dL   Calcium 8.7  8.4 - 10.5 mg/dL   GFR calc non Af Amer 55 (*) >90 mL/min   GFR calc Af  Amer 64 (*) >90 mL/min   Comment: (NOTE)     The eGFR has been calculated using the CKD EPI equation.     This calculation has not been validated in all clinical situations.     eGFR's persistently <90 mL/min signify possible Chronic Kidney     Disease.  TROPONIN I     Status: None   Collection Time    10/29/13  4:24 AM      Result Value Ref Range   Troponin I <0.30  <0.30 ng/mL   Comment:            Due to the release kinetics of cTnI,     a negative result within the first hours     of the onset of symptoms does not rule out     myocardial infarction with certainty.     If myocardial infarction is still suspected,     repeat the test at appropriate intervals.    2D ECHO 10/29/13: LV EF: 55% - 60% ------------------------------------------------------------------- Study Conclusions - Left ventricle: The cavity size was normal. Wall thickness was normal. Systolic function was normal. The estimated ejection fraction was in the range of 55% to 60%. Wall motion was normal; there were no regional wall motion abnormalities. There was an increased relative contribution of atrial contraction to ventricular filling. Doppler parameters are consistent with abnormal left ventricular relaxation (grade 1 diastolic dysfunction). - Mitral valve: There was trivial regurgitation. - Right atrium: The atrium was mildly dilated. - Tricuspid valve: There was mild-moderate regurgitation. - Pulmonary arteries: PA peak pressure: 29 mm Hg (S).  Tele: NSR  Assessment/Plan:   Principal Problem:   Chest pain Active Problems:   B12 DEFICIENCY   HYPERTENSION   GERD   Shortness of breath   Bilateral claudication of lower limb   Near syncope    Length of Stay:  LOS: 2 days   Deanna Shields is a 80 y.o. female with a history of HTN and GERD who presented to [facility] on 10/28/13 with chest pain.   Chest pain: Currently resolved, worrisome for unstable angina, -- Normal cardiac cath by Dr Rex Kras ~  10 yrs ago per patient;  last stress test in 2 years was negative  -- Ruled out, 3 sets of cardiac enzymes have been negative  -- 2D ECHO yesterday with normal LV function and no RWMA. Grd 1DD -- Currently on aspirin, beta blocker --  Undergoing nuclear stress test currently. If normal she can go home and follow up with Dr. [name].   B12 DEFICIENCY  -- Level normal, continue B12 replacement   HYPERTENSION  -- Currently stable, continue ARB  GERD  -- Continue PPI   Perry Mount 10/30/2013, 10:40 AM  Patient examined. Feels well. No further chest discomfort. Anticipate home later today if Myoview is negative. Agree with assessment and plan as noted above.

## 2013-10-31 ENCOUNTER — Telehealth: Payer: Self-pay

## 2013-10-31 NOTE — Telephone Encounter (Signed)
Admitted to [facility]: 10/28/13 for Chest Pain, Weakness, and Dizziness Discharged to [facility]:  10/30/13  Doing much better.  Denies chest pain, weakness or dizziness.  Remains independent of ADLs.  Was started on a new cardiac medication while in the hospital--Coreg.  Was discharged on same medication.  States that so far she has not had any adverse reactions to this medication.  Blood pressures are being monitored at facility by a nurse.  Nurse scheduled to come this afternoon.  She was encouraged to rise slowly when changing positions from sitting to standing and from lying down to standing.  She was also encouraged to stay hydrated and to remain as active as possible.  She was also encouraged to call EMS if she starts experiencing chest pain again.  She stated understanding and agreed.  Medication and allergies:  Updated and Reviewed New Medication: carvedilol (Coreg) 90 day supply/mail order:  [place], [place] pharmacy:  [facility] [place], [place]  No changes to personal, family history or past surgical hx   Follow up appointment scheduled with cardiology on Monday, 11/05/13 @ 1100.    Follow up appointment with PCP scheduled on Thursday, 11/08/13 @ 1130 with Dr. Etter Sjogren.

## 2013-11-05 ENCOUNTER — Ambulatory Visit (INDEPENDENT_AMBULATORY_CARE_PROVIDER_SITE_OTHER): Payer: Medicare Other | Admitting: Cardiology

## 2013-11-05 ENCOUNTER — Encounter: Payer: Self-pay | Admitting: Cardiology

## 2013-11-05 VITALS — BP 126/86 | Ht 66.0 in | Wt 166.7 lb

## 2013-11-05 DIAGNOSIS — I10 Essential (primary) hypertension: Secondary | ICD-10-CM

## 2013-11-05 DIAGNOSIS — R079 Chest pain, unspecified: Secondary | ICD-10-CM | POA: Diagnosis not present

## 2013-11-05 DIAGNOSIS — R42 Dizziness and giddiness: Secondary | ICD-10-CM | POA: Diagnosis not present

## 2013-11-05 MED ORDER — AMLODIPINE BESYLATE 2.5 MG PO TABS: 2.5000 mg | ORAL_TABLET | Freq: Every day | ORAL | Status: DC

## 2013-11-05 NOTE — Patient Instructions (Signed)
Stop coreg, begin tomorrow amlodipine at 2.5 mg daily.  If your BP is less than 110/80 with this med or greater than 150/90 call our office.  If you begin with more dizziness on the amlodipine, have the nurse check your BP.  Then let us know.  This medication is usually easy to take.  Follow up with Dr. [name] in 6 weeks.

## 2013-11-05 NOTE — Assessment & Plan Note (Addendum)
Blood pressure was elevated on admission to the emergency room,  he's having side effects to the Coreg. This was stopped and amlodipine was added at 2.5 mg daily she'll follow with Dr. [name] in 6 weeks for further evaluation if she has lightheadedness and dizziness it may be that she doesn't need any further addition for her blood pressure.  Though for some symptoms she complains of being related to the medicine team is similar symptoms prior to hospitalization.  The patient admits to not  like taking medication.  Her echo and stress test during hospitalization were stable.

## 2013-11-05 NOTE — Assessment & Plan Note (Signed)
Recent admission for chest pain back pain and left arm pain. Negative myocardial infarction, normal echocardiogram and negative nuclear stress test for ischemia. She was reassured about her heart.

## 2013-11-05 NOTE — Progress Notes (Signed)
11/09/2013   PCP: Garnet Koyanagi, DO   Chief Complaint  Patient presents with  . Follow-up    s/p hospitalizataion     Primary Cardiologist:   Dr. [name]  HPI:  80 year old married Caucasian female with no children patient of Dr. [name] and Dr. Festus Aloe. Her cardiac risk factor profile is remarkable for treated hypertension. She has never had a heart attack or stroke. She had a normal cardiac catheterization perform by Dr. Rex Kras approximately 10 years ago. She had a negative Myoview stress test within the last 2 years. She has not felt good over the last 2 weeks with complaints for weakness. She developed substernal chest pain with radiation to her left shoulder and back at 1:00 10/29/13 and was taken to [facility] emergency room. Her EKG showed no acute changes. The chest pain resolved spontaneously.  She was ruled out for an MI.  2-D echo revealed normal LV systolic function though abnormal relaxation with grade 1 diastolic dysfunction EF 0000000,  mild mitral regurg trivial and mild to moderate tricuspid regurg. PA peak pressure was 29 mm Hg.  LexiScan Myoview was also completed there was no evidence of ischemia normal left systolic function with an ejection fraction of 83%.  She was started on Corag prior to discharge and she feels she's having a lot of side effects.  Her blood pressure was elevated on admission to the hospital at [id number] systolic. Today she is well controlled off the Coreg at 126/86.  She has not had 2 doses of Corag prior to this visit. She states she feels better off of the medication.  Unfortunately I do not have any record of what her blood pressure was on the medicine perhaps it is too low,  perhaps her HR is too low.  Nonetheless she does not want to take the medicine we have stopped it.  No further chest pain no shortness of breath.   Allergies  Allergen Reactions  . Codeine Nausea And Vomiting    in large doses: can tolerate Tussionex     Current Outpatient Prescriptions  Medication Sig Dispense Refill  . aspirin 81 MG tablet Take 81 mg by mouth daily.       . calcium carbonate (OS-CAL) 600 MG TABS Take 1,200 mg by mouth daily.       . chlorpheniramine-HYDROcodone (TUSSIONEX PENNKINETIC ER) 10-8 MG/5ML LQCR Take 5 mLs by mouth every 12 (twelve) hours as needed.  140 mL  0  . cholecalciferol (VITAMIN D) 1000 UNITS tablet Take 1,000 Units by mouth daily.        . Ferrous Sulfate (IRON) 325 (65 FE) MG TABS Take 1 tablet by mouth daily.      . hydrochlorothiazide (HYDRODIURIL) 25 MG tablet Take 12.5 mg by mouth daily.      [name] loratadine (CLARITIN) 10 MG tablet Take 1 tablet (10 mg total) by mouth daily.  90 tablet  3  . losartan (COZAAR) 100 MG tablet Take 100 mg by mouth daily.      [name] omeprazole (PRILOSEC) 40 MG capsule Take 1 capsule (40 mg total) by mouth daily.  90 capsule  3  . potassium gluconate 595 MG TABS tablet Take 595 mg by mouth daily.      . vitamin B-12 (CYANOCOBALAMIN) 500 MCG tablet Take 1,000 mcg by mouth daily.      [name] amLODipine (NORVASC) 2.5 MG tablet Take 1 tablet (2.5 mg total) by mouth  daily.  90 tablet  3  . meclizine (ANTIVERT) 25 MG tablet Take 1 tablet (25 mg total) by mouth 3 (three) times daily as needed for dizziness.  30 tablet  0   No current facility-administered medications for this visit.    Past Medical History  Diagnosis Date  . Hypertension   . Rhinitis, allergic   . Hemorrhoids 2004    Colonoscopy  . Hiatal hernia 2004    EGD  . Esophageal stricture 2009    EGD   . GERD (gastroesophageal reflux disease) 2004    EGD  . Arthritis     Past Surgical History  Procedure Laterality Date  . Abdominal hysterectomy    . Hemorroidectomy    . Knee arthroscopy Bilateral     right x 2  . Carpal tunnel release Right     [id number] colds or fevers, no weight changes Skin:no rashes or ulcers HEENT:no blurred vision, no congestion CV:see HPI PUL:see HPI GI:no diarrhea  constipation or melena, no indigestion GU:no hematuria, no dysuria MS:no joint pain, no claudication Neuro:no syncope, no lightheadedness Endo:no diabetes, no thyroid disease  PHYSICAL EXAM BP 126/86  Ht 5\' 6"  (1.676 m)  Wt 166 lb 11.2 oz (75.615 kg)  BMI 26.92 kg/m2 General:Pleasant affect, NAD Skin:Warm and dry, brisk capillary refill HEENT:normocephalic, sclera clear, mucus membranes moist Neck:supple, no JVD, no bruits  Heart:S1S2 RRR without murmur, gallup, rub or click Lungs:clear without rales, rhonchi, or wheezes [id number], non tender, + BS, do not palpate liver spleen or masses Ext:no lower ext edema, 2+ pedal pulses, 2+ radial pulses Neuro:alert and oriented, MAE, follows commands, + facial symmetry    ASSESSMENT AND PLAN DIZZINESS After beginning Coreg twice a day patient became very dizzy to not feel well she stopped taking medication she has not had for 24 hours and is feeling better no orthostatic hypotension today.  I have stopped her Coreg and added amlodipine 2.5 mg daily for hypertension if she feels lightheaded or dizzy she will ask the [facility] nurse to check her blood pressure and call the results.  Chest pain Recent admission for chest pain back pain and left arm pain. Negative myocardial infarction, normal echocardiogram and negative nuclear stress test for ischemia. She was reassured about her heart.  HYPERTENSION Blood pressure was elevated on admission to the emergency room,  he's having side effects to the Coreg. This was stopped and amlodipine was added at 2.5 mg daily she'll follow with Dr. [name] in 6 weeks for further evaluation if she has lightheadedness and dizziness it may be that she doesn't need any further addition for her blood pressure.  Though for some symptoms she complains of being related to the medicine team is similar symptoms prior to hospitalization.  The patient admits to not  like taking medication.  Her echo and stress test during  hospitalization were stable.

## 2013-11-05 NOTE — Assessment & Plan Note (Addendum)
After beginning Coreg twice a day patient became very dizzy to not feel well she stopped taking medication she has not had for 24 hours and is feeling better no orthostatic hypotension today.  I have stopped her Coreg and added amlodipine 2.5 mg daily for hypertension if she feels lightheaded or dizzy she will ask the [facility] nurse to check her blood pressure and call the results.

## 2013-11-08 ENCOUNTER — Ambulatory Visit (INDEPENDENT_AMBULATORY_CARE_PROVIDER_SITE_OTHER): Payer: Medicare Other | Admitting: Family Medicine

## 2013-11-08 ENCOUNTER — Encounter: Payer: Self-pay | Admitting: Family Medicine

## 2013-11-08 VITALS — BP 142/74 | HR 94 | Temp 98.5°F | Wt 162.0 lb

## 2013-11-08 DIAGNOSIS — R42 Dizziness and giddiness: Secondary | ICD-10-CM | POA: Diagnosis not present

## 2013-11-08 DIAGNOSIS — H9190 Unspecified hearing loss, unspecified ear: Secondary | ICD-10-CM | POA: Diagnosis not present

## 2013-11-08 MED ORDER — MECLIZINE HCL 25 MG PO TABS: 25.0000 mg | ORAL_TABLET | Freq: Three times a day (TID) | ORAL | Status: DC | PRN

## 2013-11-08 NOTE — Progress Notes (Signed)
Pre visit review using our clinic review tool, if applicable. No additional management support is needed unless otherwise documented below in the visit note. 

## 2013-11-08 NOTE — Patient Instructions (Addendum)
Use debrox or cerumenex in R ear daily until you see ent     Dizziness Dizziness is a common problem. It is a feeling of unsteadiness or lightheadedness. You may feel like you are about to faint. Dizziness can lead to injury if you stumble or fall. A person of any age group can suffer from dizziness, but dizziness is more common in older adults. CAUSES  Dizziness can be caused by many different things, including:  Middle ear problems.  Standing for too long.  Infections.  An allergic reaction.  Aging.  An emotional response to something, such as the sight of blood.  Side effects of medicines.  Fatigue.  Problems with circulation or blood pressure.  Excess use of alcohol, medicines, or illegal drug use.  Breathing too fast (hyperventilation).  An arrhythmia or problems with your heart rhythm.  Low red blood cell count (anemia).  Pregnancy.  Vomiting, diarrhea, fever, or other illnesses that cause dehydration.  Diseases or conditions such as Parkinson's disease, high blood pressure (hypertension), diabetes, and thyroid problems.  Exposure to extreme heat. DIAGNOSIS  To find the cause of your dizziness, your caregiver may do a physical exam, lab tests, radiologic imaging scans, or an electrocardiography test (ECG).  TREATMENT  Treatment of dizziness depends on the cause of your symptoms and can vary greatly. HOME CARE INSTRUCTIONS   Drink enough fluids to keep your urine clear or pale yellow. This is especially important in very hot weather. In the elderly, it is also important in cold weather.  If your dizziness is caused by medicines, take them exactly as directed. When taking blood pressure medicines, it is especially important to get up slowly.  Rise slowly from chairs and steady yourself until you feel okay.  In the morning, first sit up on the side of the bed. When this seems okay, stand slowly while holding onto something until you know your balance is  fine.  If you need to stand in one place for a long time, be sure to move your legs often. Tighten and relax the muscles in your legs while standing.  If dizziness continues to be a problem, have someone stay with you for a day or two. Do this until you feel you are well enough to stay alone. Have the person call your caregiver if he or she notices changes in you that are concerning.  Do not drive or use heavy machinery if you feel dizzy.  Do not drink alcohol. SEEK IMMEDIATE MEDICAL CARE IF:   Your dizziness or lightheadedness gets worse.  You feel nauseous or vomit.  You develop problems with talking, walking, weakness, or using your arms, hands, or legs.  You are not thinking clearly or you have difficulty forming sentences. It may take a friend or family member to determine if your thinking is normal.  You develop chest pain, abdominal pain, shortness of breath, or sweating.  Your vision changes.  You notice any bleeding.  You have side effects from medicine that seems to be getting worse rather than better. MAKE SURE YOU:   Understand these instructions.  Will watch your condition.  Will get help right away if you are not doing well or get worse. Document Released: 11/17/2000 Document Revised: 08/16/2011 Document Reviewed: 12/11/2010 [facility] Patient Information 2014 [place], [place].

## 2013-11-08 NOTE — Progress Notes (Signed)
   Subjective:    Patient ID: Deanna Shields, female    DOB: 1928-06-28, 80 y.o.   MRN: [id number]  HPI    Review of Systems     Objective:   Physical Exam        Assessment & Plan:   Subjective:     Deanna Shields is a 80 y.o. female who presents for evaluation of dizziness. The symptoms started several weeks ago and are improved. The attacks occur daily and last 30 minutes. Positions that worsen symptoms: none. Previous workup/treatments: see ER / hospital admit. Associated ear symptoms: L ear feels full. Associated CNS symptoms: none. Recent infections: none. Head trauma: denied. Drug ingestion: none. Noise exposure: no occupational exposure. Family history: non-contributory.  The following portions of the patient's history were reviewed and updated as appropriate: allergies, current medications, past family history, past medical history, past social history, past surgical history and problem list.  Review of Systems Pertinent items are noted in HPI.    Objective:    BP 142/74  Pulse 94  Temp(Src) 98.5 F (36.9 C) (Oral)  Wt 162 lb (73.483 kg)  SpO2 97% General appearance: alert, cooperative, appears stated age and no distress Ears: R -cerumen impaction  L ear normal---unable to use hoop and irrigation was not successful Nose: Nares normal. Septum midline. Mucosa normal. No drainage or sinus tenderness. Throat: lips, mucosa, and tongue normal; teeth and gums normal Neck: no adenopathy, supple, symmetrical, trachea midline and thyroid not enlarged, symmetric, no tenderness/mass/nodules Lungs: clear to auscultation bilaterally Heart: S1, S2 normal      Assessment:    Vertigo  -- with hearing loss    Plan:    Meclizine per medication orders. Referral to ENT. Follow up in 3 months.

## 2013-11-09 ENCOUNTER — Observation Stay (HOSPITAL_COMMUNITY)
Admission: EM | Admit: 2013-11-09 | Discharge: 2013-11-11 | Disposition: A | Discharge status: Home / Self Care | Payer: Medicare Other | Attending: Internal Medicine | Admitting: Internal Medicine

## 2013-11-09 ENCOUNTER — Encounter: Payer: Self-pay | Admitting: Cardiology

## 2013-11-09 ENCOUNTER — Encounter (HOSPITAL_COMMUNITY): Payer: Self-pay | Admitting: Emergency Medicine

## 2013-11-09 DIAGNOSIS — R55 Syncope and collapse: Principal | ICD-10-CM | POA: Insufficient documentation

## 2013-11-09 DIAGNOSIS — I10 Essential (primary) hypertension: Secondary | ICD-10-CM | POA: Diagnosis not present

## 2013-11-09 DIAGNOSIS — E539 Vitamin B deficiency, unspecified: Secondary | ICD-10-CM | POA: Insufficient documentation

## 2013-11-09 DIAGNOSIS — E878 Other disorders of electrolyte and fluid balance, not elsewhere classified: Secondary | ICD-10-CM | POA: Insufficient documentation

## 2013-11-09 DIAGNOSIS — R1011 Right upper quadrant pain: Secondary | ICD-10-CM

## 2013-11-09 DIAGNOSIS — E559 Vitamin D deficiency, unspecified: Secondary | ICD-10-CM

## 2013-11-09 DIAGNOSIS — I739 Peripheral vascular disease, unspecified: Secondary | ICD-10-CM

## 2013-11-09 DIAGNOSIS — E871 Hypo-osmolality and hyponatremia: Secondary | ICD-10-CM | POA: Diagnosis not present

## 2013-11-09 DIAGNOSIS — R42 Dizziness and giddiness: Secondary | ICD-10-CM

## 2013-11-09 DIAGNOSIS — K219 Gastro-esophageal reflux disease without esophagitis: Secondary | ICD-10-CM | POA: Diagnosis not present

## 2013-11-09 DIAGNOSIS — Z7982 Long term (current) use of aspirin: Secondary | ICD-10-CM | POA: Insufficient documentation

## 2013-11-09 DIAGNOSIS — J309 Allergic rhinitis, unspecified: Secondary | ICD-10-CM

## 2013-11-09 DIAGNOSIS — Z87898 Personal history of other specified conditions: Secondary | ICD-10-CM | POA: Diagnosis present

## 2013-11-09 DIAGNOSIS — M549 Dorsalgia, unspecified: Secondary | ICD-10-CM

## 2013-11-09 DIAGNOSIS — R404 Transient alteration of awareness: Secondary | ICD-10-CM | POA: Diagnosis not present

## 2013-11-09 LAB — BASIC METABOLIC PANEL
BUN: 18 mg/dL (ref 6–23)
CALCIUM: 9.6 mg/dL (ref 8.4–10.5)
CO2: 25 mEq/L (ref 19–32)
CREATININE: 1.22 mg/dL — AB (ref 0.50–1.10)
Chloride: 91 mEq/L — ABNORMAL LOW (ref 96–112)
GFR calc Af Amer: 46 mL/min — ABNORMAL LOW (ref >90)
GFR calc non Af Amer: 40 mL/min — ABNORMAL LOW (ref >90)
Glucose, Bld: 123 mg/dL — ABNORMAL HIGH (ref 70–99)
Potassium: 3.3 mEq/L — ABNORMAL LOW (ref 3.7–5.3)
Sodium: 131 mEq/L — ABNORMAL LOW (ref 137–147)

## 2013-11-09 LAB — CBC
HCT: 38.8 % (ref 36.0–46.0)
Hemoglobin: 13.6 g/dL (ref 12.0–15.0)
MCH: 31.4 pg (ref 26.0–34.0)
MCHC: 35.1 g/dL (ref 30.0–36.0)
MCV: 89.6 fL (ref 78.0–100.0)
Platelets: 247 10*3/uL (ref 150–400)
RBC: 4.33 MIL/uL (ref 3.87–5.11)
RDW: 15.6 % — AB (ref 11.5–15.5)
WBC: 10.8 10*3/uL — AB (ref 4.0–10.5)

## 2013-11-09 LAB — CBG MONITORING, ED: Glucose-Capillary: 121 mg/dL — ABNORMAL HIGH (ref 70–99)

## 2013-11-09 LAB — I-STAT TROPONIN, ED: TROPONIN I, POC: 0 ng/mL (ref 0.00–0.08)

## 2013-11-09 MED ORDER — SODIUM CHLORIDE 0.9 % IV BOLUS (SEPSIS)
1000.0000 mL | Freq: Once | INTRAVENOUS | Status: AC
  Administered 2013-11-09: 1000 mL via INTRAVENOUS

## 2013-11-09 NOTE — ED Notes (Signed)
Per EMS- Pt comes from [facility], where she was sitting at table, staff reports pt's head dropped to the side, lasted 45 secs subsided. Reports nauseated. Denies cp. Given 4 mg zofran. EKG SR, BP 101/67, CBG 123.

## 2013-11-10 ENCOUNTER — Encounter (HOSPITAL_COMMUNITY): Payer: Self-pay

## 2013-11-10 DIAGNOSIS — R42 Dizziness and giddiness: Secondary | ICD-10-CM

## 2013-11-10 DIAGNOSIS — R55 Syncope and collapse: Principal | ICD-10-CM

## 2013-11-10 DIAGNOSIS — J309 Allergic rhinitis, unspecified: Secondary | ICD-10-CM

## 2013-11-10 DIAGNOSIS — M549 Dorsalgia, unspecified: Secondary | ICD-10-CM

## 2013-11-10 DIAGNOSIS — I739 Peripheral vascular disease, unspecified: Secondary | ICD-10-CM

## 2013-11-10 DIAGNOSIS — R1011 Right upper quadrant pain: Secondary | ICD-10-CM

## 2013-11-10 DIAGNOSIS — I10 Essential (primary) hypertension: Secondary | ICD-10-CM

## 2013-11-10 DIAGNOSIS — E871 Hypo-osmolality and hyponatremia: Secondary | ICD-10-CM | POA: Diagnosis present

## 2013-11-10 DIAGNOSIS — Z87898 Personal history of other specified conditions: Secondary | ICD-10-CM | POA: Diagnosis present

## 2013-11-10 LAB — BASIC METABOLIC PANEL
BUN: 14 mg/dL (ref 6–23)
CO2: 25 mEq/L (ref 19–32)
Calcium: 9 mg/dL (ref 8.4–10.5)
Chloride: 98 mEq/L (ref 96–112)
Creatinine, Ser: 1.02 mg/dL (ref 0.50–1.10)
GFR, EST AFRICAN AMERICAN: 57 mL/min — AB (ref >90)
GFR, EST NON AFRICAN AMERICAN: 49 mL/min — AB (ref >90)
GLUCOSE: 103 mg/dL — AB (ref 70–99)
POTASSIUM: 4.3 meq/L (ref 3.7–5.3)
SODIUM: 135 meq/L — AB (ref 137–147)

## 2013-11-10 LAB — CBC
HEMATOCRIT: 34.4 % — AB (ref 36.0–46.0)
Hemoglobin: 12.1 g/dL (ref 12.0–15.0)
MCH: 31.8 pg (ref 26.0–34.0)
MCHC: 35.2 g/dL (ref 30.0–36.0)
MCV: 90.3 fL (ref 78.0–100.0)
Platelets: 223 10*3/uL (ref 150–400)
RBC: 3.81 MIL/uL — AB (ref 3.87–5.11)
RDW: 16 % — ABNORMAL HIGH (ref 11.5–15.5)
WBC: 8.4 10*3/uL (ref 4.0–10.5)

## 2013-11-10 LAB — MRSA PCR SCREENING: MRSA by PCR: NEGATIVE

## 2013-11-10 LAB — TROPONIN I: Troponin I: <0.3 ng/mL (ref <0.30)

## 2013-11-10 LAB — CREATININE, SERUM
Creatinine, Ser: 1.02 mg/dL (ref 0.50–1.10)
GFR calc Af Amer: 57 mL/min — ABNORMAL LOW (ref >90)
GFR calc non Af Amer: 49 mL/min — ABNORMAL LOW (ref >90)

## 2013-11-10 MED ORDER — CALCIUM CARBONATE 1250 (500 CA) MG PO TABS
1200.0000 mg | ORAL_TABLET | Freq: Every day | ORAL | Status: DC
  Administered 2013-11-10 – 2013-11-11 (×2): 1250 mg via ORAL
  Filled 2013-11-10 (×2): qty 1

## 2013-11-10 MED ORDER — VITAMIN D3 25 MCG (1000 UNIT) PO TABS
1000.0000 [IU] | ORAL_TABLET | Freq: Every day | ORAL | Status: DC
  Administered 2013-11-10 – 2013-11-11 (×2): 1000 [IU] via ORAL
  Filled 2013-11-10 (×2): qty 1

## 2013-11-10 MED ORDER — SODIUM CHLORIDE 0.9 % IV SOLN: INTRAVENOUS | Status: DC

## 2013-11-10 MED ORDER — LOSARTAN POTASSIUM 50 MG PO TABS
100.0000 mg | ORAL_TABLET | Freq: Every day | ORAL | Status: DC
  Filled 2013-11-10: qty 2

## 2013-11-10 MED ORDER — HYDROCHLOROTHIAZIDE 25 MG PO TABS
12.5000 mg | ORAL_TABLET | Freq: Every day | ORAL | Status: DC
  Filled 2013-11-10: qty 0.5

## 2013-11-10 MED ORDER — ONDANSETRON HCL 4 MG/2ML IJ SOLN: 4.0000 mg | Freq: Four times a day (QID) | INTRAMUSCULAR | Status: DC | PRN

## 2013-11-10 MED ORDER — SODIUM CHLORIDE 0.9 % IV SOLN
INTRAVENOUS | Status: DC
  Administered 2013-11-10: 02:00:00 via INTRAVENOUS

## 2013-11-10 MED ORDER — SODIUM CHLORIDE 0.9 % IJ SOLN
3.0000 mL | Freq: Two times a day (BID) | INTRAMUSCULAR | Status: DC
  Administered 2013-11-10 (×2): 3 mL via INTRAVENOUS

## 2013-11-10 MED ORDER — LORATADINE 10 MG PO TABS
10.0000 mg | ORAL_TABLET | Freq: Every day | ORAL | Status: DC
  Administered 2013-11-10 – 2013-11-11 (×2): 10 mg via ORAL
  Filled 2013-11-10 (×2): qty 1

## 2013-11-10 MED ORDER — ASPIRIN 81 MG PO TABS: 81.0000 mg | ORAL_TABLET | Freq: Every day | ORAL | Status: DC

## 2013-11-10 MED ORDER — ACETAMINOPHEN 325 MG PO TABS: 650.0000 mg | ORAL_TABLET | Freq: Four times a day (QID) | ORAL | Status: DC | PRN

## 2013-11-10 MED ORDER — POTASSIUM CHLORIDE IN NACL 20-0.9 MEQ/L-% IV SOLN
INTRAVENOUS | Status: AC
  Administered 2013-11-10 (×2): via INTRAVENOUS
  Filled 2013-11-10 (×3): qty 1000

## 2013-11-10 MED ORDER — OXYCODONE HCL 5 MG PO TABS: 5.0000 mg | ORAL_TABLET | ORAL | Status: DC | PRN

## 2013-11-10 MED ORDER — ONDANSETRON HCL 4 MG PO TABS: 4.0000 mg | ORAL_TABLET | Freq: Four times a day (QID) | ORAL | Status: DC | PRN

## 2013-11-10 MED ORDER — PANTOPRAZOLE SODIUM 40 MG PO TBEC
40.0000 mg | DELAYED_RELEASE_TABLET | Freq: Every day | ORAL | Status: DC
  Administered 2013-11-10 – 2013-11-11 (×2): 40 mg via ORAL
  Filled 2013-11-10 (×2): qty 1

## 2013-11-10 MED ORDER — SODIUM CHLORIDE 0.9 % IJ SOLN
3.0000 mL | Freq: Two times a day (BID) | INTRAMUSCULAR | Status: DC
  Administered 2013-11-10 – 2013-11-11 (×3): 3 mL via INTRAVENOUS

## 2013-11-10 MED ORDER — ENOXAPARIN SODIUM 40 MG/0.4ML ~~LOC~~ SOLN
40.0000 mg | SUBCUTANEOUS | Status: DC
  Administered 2013-11-10: 40 mg via SUBCUTANEOUS
  Filled 2013-11-10 (×2): qty 0.4

## 2013-11-10 MED ORDER — ACETAMINOPHEN 650 MG RE SUPP: 650.0000 mg | Freq: Four times a day (QID) | RECTAL | Status: DC | PRN

## 2013-11-10 MED ORDER — ASPIRIN EC 81 MG PO TBEC
81.0000 mg | DELAYED_RELEASE_TABLET | Freq: Every day | ORAL | Status: DC
  Administered 2013-11-10 – 2013-11-11 (×2): 81 mg via ORAL
  Filled 2013-11-10 (×2): qty 1

## 2013-11-10 MED ORDER — SODIUM CHLORIDE 0.9 % IJ SOLN: 3.0000 mL | INTRAMUSCULAR | Status: DC | PRN

## 2013-11-10 NOTE — H&P (Signed)
[facility]                                                                                    Patient Demographics  Deanna Shields, is a 80 y.o. female  CSN: [id number]  MRN: [id number]  DOB - [birth year]  Admit Date - 11/09/2013  Outpatient Primary MD for the patient is Garnet Koyanagi, DO   With History of -  Past Medical History  Diagnosis Date  . Hypertension   . Rhinitis, allergic   . Hemorrhoids 2004    Colonoscopy  . Hiatal hernia 2004    EGD  . Esophageal stricture 2009    EGD   . GERD (gastroesophageal reflux disease) 2004    EGD  . Arthritis       Past Surgical History  Procedure Laterality Date  . Abdominal hysterectomy    . Hemorroidectomy    . Knee arthroscopy Bilateral     right x 2  . Carpal tunnel release Right     in for   Chief Complaint  Patient presents with  . Near Syncope     HPI  Deanna Shields  is a 80 y.o. female, with a recent admission for syncope on one month ago, hypertension presenting today with a similar episode while she was having dinner she was overwhelmed with feelings of warmth and she had a 4 minute syncope witnessed by her husband with no seizure activity. No prior chest pains palpitations. Mild felt vertiginous feeling was reported. No history of trauma to head no shortness of breath cough fever or chills    Review of Systems    In addition to the HPI above,  No Fever-chills, No Headache, No changes with Vision or hearing, No problems swallowing food or Liquids, No Chest pain, Cough or Shortness of Breath, No Abdominal pain, No Nausea or Vommitting, Bowel movements are regular, No Blood in stool or Urine, No dysuria, No new skin rashes or bruises, No new joints pains-aches,  No new weakness, tingling, numbness in any extremity, No recent weight gain or loss, No polyuria, polydypsia or polyphagia, No significant Mental Stressors.  A full 10 point Review of Systems was done, except as stated above,  all other Review of Systems were negative.   Social History History  Substance Use Topics  . Smoking status: Never Smoker   . Smokeless tobacco: Never Used  . Alcohol Use: No     Family History Family History  Problem Relation Age of Onset  . Stroke Father 35    light stroke  . Heart disease Sister   . Coronary artery disease Neg Hx   . Diabetes Neg Hx   . Cancer Neg Hx     breast, colon, prostate     Prior to Admission medications   Medication Sig Start Date End Date Taking? Authorizing Provider  aspirin 81 MG tablet Take 81 mg by mouth daily.    Yes Historical Provider, MD  calcium carbonate (OS-CAL) 600 MG TABS Take 1,200 mg by mouth daily.    Yes Historical Provider, MD  cholecalciferol (VITAMIN D) 1000 UNITS tablet Take 1,000 Units by mouth daily.  Yes Historical Provider, MD  Ferrous Sulfate (IRON) 325 (65 FE) MG TABS Take 1 tablet by mouth daily.   Yes Historical Provider, MD  hydrochlorothiazide (HYDRODIURIL) 25 MG tablet Take 12.5 mg by mouth daily.   Yes Historical Provider, MD  loratadine (CLARITIN) 10 MG tablet Take 1 tablet (10 mg total) by mouth daily. 07/18/13  Yes Yvonne R Lowne, DO  losartan (COZAAR) 100 MG tablet Take 100 mg by mouth daily.   Yes Historical Provider, MD  omeprazole (PRILOSEC) 40 MG capsule Take 1 capsule (40 mg total) by mouth daily. 07/18/13  Yes Yvonne R Lowne, DO  potassium gluconate 595 MG TABS tablet Take 595 mg by mouth daily.   Yes Historical Provider, MD  vitamin B-12 (CYANOCOBALAMIN) 500 MCG tablet Take 1,000 mcg by mouth daily.   Yes Historical Provider, MD    Allergies  Allergen Reactions  . Codeine Nausea And Vomiting    in large doses: can tolerate Tussionex    Physical Exam  Vitals  Blood pressure 130/51, pulse 80, temperature 97.7 F (36.5 C), temperature source Oral, resp. rate 13, weight 74.844 kg (165 lb), SpO2 90.00%.   1. General elderly white American female lying in bed comfortable at this time  2. Normal  affect and insight, Not Suicidal or Homicidal, Awake Alert, Oriented X 3.  3. No F.N deficits, ALL C.Nerves Intact, Strength 5/5 all 4 extremities, Sensation intact all 4 extremities, Plantars down going.  4. Ears and Eyes appear Normal, Conjunctivae clear, PERRLA. Moist Oral Mucosa.  5. Supple Neck, No JVD, No cervical lymphadenopathy appriciated, No Carotid Bruits.  6. Symmetrical Chest wall movement, Good air movement bilaterally, CTAB.  7. RRR, No Gallops, Rubs or Murmurs, No Parasternal Heave.  8. Positive Bowel Sounds, Abdomen Soft, Non tender, No organomegaly appriciated,No rebound -guarding or rigidity.  9.  No Cyanosis, Normal Skin Turgor, No Skin Rash or Bruise.  10. Good muscle tone,  joints appear normal , no effusions, Normal ROM.  11. No Palpable Lymph Nodes in Neck or Axillae    Data Review  CBC  Recent Labs Lab 11/09/13 1918  WBC 10.8*  HGB 13.6  HCT 38.8  PLT 247  MCV 89.6  MCH 31.4  MCHC 35.1  RDW 15.6*   ------------------------------------------------------------------------------------------------------------------  Chemistries   Recent Labs Lab 11/09/13 1918  NA 131*  K 3.3*  CL 91*  CO2 25  GLUCOSE 123*  BUN 18  CREATININE 1.22*  CALCIUM 9.6   ------------------------------------------------------------------------------------------------------------------ CrCl is unknown because both a height and weight (above a minimum accepted value) are required for this calculation. ------------------------------------------------------------------------------------------------------------------ No results found for this basename: TSH, T4TOTAL, FREET3, T3FREE, THYROIDAB,  in the last 72 hours   Coagulation profile No results found for this basename: INR, PROTIME,  in the last 168 hours ------------------------------------------------------------------------------------------------------------------- No results found for this basename: DDIMER,   in the last 72 hours -------------------------------------------------------------------------------------------------------------------  Cardiac Enzymes No results found for this basename: CK, CKMB, TROPONINI, MYOGLOBIN,  in the last 168 hours ------------------------------------------------------------------------------------------------------------------ No components found with this basename: POCBNP,    ---------------------------------------------------------------------------------------------------------------  Urinalysis    Component Value Date/Time   COLORURINE YELLOW 10/28/2013 2000   APPEARANCEUR CLOUDY* 10/28/2013 2000   LABSPEC 1.011 10/28/2013 2000   [place] 7.0 10/28/2013 2000   GLUCOSEU NEGATIVE 10/28/2013 2000   HGBUR NEGATIVE 10/28/2013 2000   HGBUR negative 06/04/2008 [place] 10/28/2013 2000   BILIRUBINUR Neg 12/27/2012 [place] 10/28/2013 [place] 10/28/2013  2000   PROTEINUR Neg 12/27/2012 1046   UROBILINOGEN 0.2 10/28/2013 2000   UROBILINOGEN 0.2 12/27/2012 1046   NITRITE NEGATIVE 10/28/2013 2000   NITRITE Neg 12/27/2012 1046   LEUKOCYTESUR SMALL* 10/28/2013 2000    ----------------------------------------------------------------------------------------------------------------  AImaging results:   Dg Chest 2 View  10/28/2013   CLINICAL DATA:  Dizziness.  Lightheadedness.  EXAM: CHEST  2 VIEW  COMPARISON:  Chest radiograph 10/19/2013.  FINDINGS: Stable cardiac and mediastinal contours. No consolidative pulmonary opacities. No pleural effusion or pneumothorax. Mid thoracic spine degenerative change.  IMPRESSION: No acute cardiopulmonary process.   Electronically Signed   By: Lovey Newcomer M.D.   On: 10/28/2013 14:56   Dg Chest 2 View  10/19/2013   CLINICAL DATA:  Dizziness, blurred vision, nausea, backache, history hypertension, GERD  EXAM: CHEST  2 VIEW  COMPARISON:  08/10/2012 ; correlation CT chest 03/29/2013   FINDINGS: Upper-normal size of cardiac silhouette.  Mediastinal contours and pulmonary vascularity normal.  Hyperinflation without acute infiltrate, pleural effusion or pneumothorax.  Bones demineralized.  IMPRESSION: Chronic hyperinflation without acute infiltrate.   Electronically Signed   By: Lavonia Dana M.D.   On: 10/19/2013 12:36   Ct Head Wo Contrast  10/19/2013   CLINICAL DATA:  Dizziness and near syncope.  EXAM: CT HEAD WITHOUT CONTRAST  TECHNIQUE: Contiguous axial images were obtained from the base of the skull through the vertex without intravenous contrast.  COMPARISON:  None.  FINDINGS: There is no evidence of acute cortical infarct, intracranial hemorrhage, mass, midline shift, or extra-axial fluid collection. Periventricular white-matter hypodensities are nonspecific but compatible with moderate chronic small vessel ischemic disease. Age-related cerebral atrophy is present. Orbits are unremarkable. Mastoid air cells and visualized paranasal sinuses are clear.  IMPRESSION: 1. No evidence of acute intracranial abnormality. 2. Moderate chronic small vessel ischemic disease.   Electronically Signed   By: Logan Bores   On: 10/19/2013 12:19   Nm Myocar Multi W/spect Tamela Oddi Motion / Ef  10/30/2013   CLINICAL DATA:  [place] Offenberger is an 80 yo who was admitted with chest pain. She was scheduled for a Lexiscan Myoview for further evaluation.  EXAM: MYOCARDIAL IMAGING WITH SPECT (REST AND PHARMACOLOGIC-STRESS)  GATED LEFT VENTRICULAR WALL MOTION STUDY  LEFT VENTRICULAR EJECTION FRACTION  TECHNIQUE: Standard myocardial SPECT imaging was performed after resting intravenous injection of 10 mCi Tc-53m sestamibi. Subsequently, intravenous infusion of Lexiscan was performed under the supervision of the Cardiology staff. At peak effect of the drug, 30 mCi Tc-26m sestamibi was injected intravenously and standard myocardial SPECT imaging was performed. Quantitative gated imaging was also performed to evaluate left  ventricular wall motion, and estimate left ventricular ejection fraction.  COMPARISON:  None.  FINDINGS: There were no ECG changes to suggest ischemia. The raw data images reveal no significant motion artifact.  The stress scintigraphic images reveals fairly smooth and homogeneous uptake in all areas of the myocardium. The resting images reveal a similar pattern of smooth and homogeneous uptake in all areas of the myocardium.  The quantitated gated SPECT images reveal an end-diastolic volume of 43 mL with an end-systolic volume of 7 mm. This is an ejection fraction of 83%. There is normal left ventricular contractility.  IMPRESSION: This is interpreted as a negative Lexiscan Myoview study. She has no evidence of ischemia. She has normal left ventricular systolic function with an ejection fraction of 83%.   Electronically Signed   By: Mertie Moores   On: 10/30/2013 13:45      Assessment &  Plan  Syncopal episode  Recurrent , no obvious source Hyponatremia probably medication induced Hypokalemia probably medication induced  Plan IV fluids normal saline with potassium Check electrolytes in a.m. PT evaluation and treatment MRI of the brain in a.m. Consider neurology consult in a.m.      DVT Prophylaxis Lovenox  AM Labs Ordered, also please review Full Orders  Family Communication: Admission, patients condition and plan of care including tests being ordered have been discussed with the patient and husband who indicate understanding and agree with the plan and Code Status.  Code Status full  Disposition Plan: Home  Time spent in minutes : 34 minutes  Condition GUARDED    @SIGNATURE @

## 2013-11-10 NOTE — ED Provider Notes (Signed)
CSN: [id number]     Arrival date & time 11/09/13  1825 History   First MD Initiated Contact with Patient 11/09/13 1936     Chief Complaint  Patient presents with  . Near Syncope     (Consider location/radiation/quality/duration/timing/severity/associated sxs/prior Treatment) HPI Comments: 80 y.o. female, with a recent admission for syncope on one month ago, hypertension presenting today with cc of syncope. Pt had a syncopal episode while she was having dinner. Pt had preceding he was warmth, no chest pain, dib, palpitations. The episode was witnessed by her husband with no seizure activity.  Patient is a 80 y.o. female presenting with near-syncope. The history is provided by the patient.  Near Syncope Pertinent negatives include no chest pain, no abdominal pain and no shortness of breath.    Past Medical History  Diagnosis Date  . Hypertension   . Rhinitis, allergic   . Hemorrhoids 2004    Colonoscopy  . Hiatal hernia 2004    EGD  . Esophageal stricture 2009    EGD   . GERD (gastroesophageal reflux disease) 2004    EGD  . Arthritis    Past Surgical History  Procedure Laterality Date  . Abdominal hysterectomy    . Hemorroidectomy    . Knee arthroscopy Bilateral     right x 2  . Carpal tunnel release Right    Family History  Problem Relation Age of Onset  . Stroke Father 22    light stroke  . Heart disease Sister   . Coronary artery disease Neg Hx   . Diabetes Neg Hx   . Cancer Neg Hx     breast, colon, prostate   History  Substance Use Topics  . Smoking status: Never Smoker   . Smokeless tobacco: Never Used  . Alcohol Use: No   OB History   Grav Para Term Preterm Abortions TAB SAB Ect Mult Living                 Review of Systems  Constitutional: Negative for activity change.  HENT: Negative for facial swelling.   Respiratory: Negative for cough, shortness of breath and wheezing.   Cardiovascular: Positive for near-syncope. Negative for chest pain.   Gastrointestinal: Negative for nausea, vomiting, abdominal pain, diarrhea, constipation, blood in stool and abdominal distention.  Genitourinary: Negative for hematuria and difficulty urinating.  Musculoskeletal: Negative for neck pain.  Skin: Negative for color change.  Neurological: Positive for syncope. Negative for speech difficulty.  Hematological: Does not bruise/bleed easily.  Psychiatric/Behavioral: Negative for confusion.      Allergies  Codeine  Home Medications   Prior to Admission medications   Medication Sig Start Date End Date Taking? Authorizing Provider  aspirin 81 MG tablet Take 81 mg by mouth daily.    Yes Historical Provider, MD  calcium carbonate (OS-CAL) 600 MG TABS Take 1,200 mg by mouth daily.    Yes Historical Provider, MD  cholecalciferol (VITAMIN D) 1000 UNITS tablet Take 1,000 Units by mouth daily.     Yes Historical Provider, MD  Ferrous Sulfate (IRON) 325 (65 FE) MG TABS Take 1 tablet by mouth daily.   Yes Historical Provider, MD  hydrochlorothiazide (HYDRODIURIL) 25 MG tablet Take 12.5 mg by mouth daily.   Yes Historical Provider, MD  loratadine (CLARITIN) 10 MG tablet Take 1 tablet (10 mg total) by mouth daily. 07/18/13  Yes Yvonne R Lowne, DO  losartan (COZAAR) 100 MG tablet Take 100 mg by mouth daily.   Yes Historical Provider,  MD  omeprazole (PRILOSEC) 40 MG capsule Take 1 capsule (40 mg total) by mouth daily. 07/18/13  Yes Yvonne R Lowne, DO  potassium gluconate 595 MG TABS tablet Take 595 mg by mouth daily.   Yes Historical Provider, MD  vitamin B-12 (CYANOCOBALAMIN) 500 MCG tablet Take 1,000 mcg by mouth daily.   Yes Historical Provider, MD   BP 132/51  Pulse 87  Temp(Src) 97.9 F (36.6 C) (Oral)  Resp 20  Ht 5\' 5"  (1.651 m)  Wt 169 lb 3.2 oz (76.749 kg)  BMI 28.16 kg/m2  SpO2 98% Physical Exam  Nursing note and vitals reviewed. Constitutional: She is oriented to person, place, and time. She appears well-developed and well-nourished.   HENT:  Head: Normocephalic and atraumatic.  Eyes: EOM are normal. Pupils are equal, round, and reactive to light.  Neck: Neck supple.  Cardiovascular: Normal rate, regular rhythm and normal heart sounds.   No murmur heard. Pulmonary/Chest: Effort normal. No respiratory distress.  Abdominal: Soft. She exhibits no distension. There is no tenderness. There is no rebound and no guarding.  Neurological: She is alert and oriented to person, place, and time.  Skin: Skin is warm and dry.    ED Course  Procedures (including critical care time) Labs Review Labs Reviewed  CBC - Abnormal; Notable for the following:    WBC 10.8 (*)    RDW 15.6 (*)    All other components within normal limits  BASIC METABOLIC PANEL - Abnormal; Notable for the following:    Sodium 131 (*)    Potassium 3.3 (*)    Chloride 91 (*)    Glucose, Bld 123 (*)    Creatinine, Ser 1.22 (*)    GFR calc non Af Amer 40 (*)    GFR calc Af Amer 46 (*)    All other components within normal limits  CBC - Abnormal; Notable for the following:    RBC 3.81 (*)    HCT 34.4 (*)    RDW 16.0 (*)    All other components within normal limits  CREATININE, SERUM - Abnormal; Notable for the following:    GFR calc non Af Amer 49 (*)    GFR calc Af Amer 57 (*)    All other components within normal limits  BASIC METABOLIC PANEL - Abnormal; Notable for the following:    Sodium 135 (*)    Glucose, Bld 103 (*)    GFR calc non Af Amer 49 (*)    GFR calc Af Amer 57 (*)    All other components within normal limits  CBG MONITORING, ED - Abnormal; Notable for the following:    Glucose-Capillary 121 (*)    All other components within normal limits  MRSA PCR SCREENING  TROPONIN I  TROPONIN I  TROPONIN I  SODIUM, URINE, RANDOM  CREATININE, URINE, RANDOM  BASIC METABOLIC PANEL  I-STAT TROPOININ, ED    Imaging Review No results found.   EKG Interpretation   Date/Time:  Friday November 09 2013 18:35:49 EDT Ventricular Rate:  72 PR  Interval:  176 QRS Duration: 103 QT Interval:  428 QTC Calculation: 468 R Axis:   -13 Text Interpretation:  Sinus rhythm Low voltage, precordial leads Confirmed  by Deb Loudin, MD, Thelma Comp [phone]) on 11/09/2013 6:58:51 PM      MDM   Final diagnoses:  Syncope   DDx includes: Orthostatic hypotension Stroke Vertebral artery dissection/stenosis Dysrhythmia PE Vasovagal/neurocardiogenic syncope Aortic stenosis Valvular disorder/Cardiomyopathy Anemia  PT comes in with cc of syncope.  She had a recent admission for same, and had a neg cardiac workup. However, there is some change to her BP meds. Family not feeling comfortable with the syncopal episodes -given the age. Admit - obs.  Varney Biles, MD 11/10/13 [phone]

## 2013-11-10 NOTE — ED Notes (Signed)
Attempted report 

## 2013-11-10 NOTE — Progress Notes (Signed)
[facility] PROGRESS NOTE Assessment/Plan: Syncope: - Checkrinary Na and Cr. On admission she was hyponatremic and hypochloremic with a rise is Cr. - baseline Cr 0.9 on admission 1.2, with IV fluids now 1.0 - No orthostatics check on admission. - cont IV fluids. B-met in am.  Hyponatremia - improving with IV fluids.  HYPERTENSION - not a good a candidate for HCTZ goal < 160/90    Code Status: full Family Communication: none  Disposition Plan: observation.   Consultants:  none  Procedures:  CXR  Antibiotics:  noen (indicate start date, and stop date if known)  HPI/Subjective: She relates she has been dizzy upon standing and decrease energy for weeks.  Objective: Filed Vitals:   11/10/13 0000 11/10/13 0030 11/10/13 0159 11/10/13 0611  BP: 124/48 130/51 149/62 126/52  Pulse: 82 80 86 81  Temp:   97.8 F (36.6 C) 98 F (36.7 C)  TempSrc:   Oral Oral  Resp: _0 Height:   5' 5" (1.651 m)   Weight:   76.749 kg (169 lb 3.2 oz)   SpO2: 92% 90% 98% 98%   No intake or output data in the 24 hours ending 11/10/13 0808 Filed Weights   11/09/13 1836 11/10/13 0159  Weight: 74.844 kg (165 lb) 76.749 kg (169 lb 3.2 oz)    Exam:  General: Alert, awake, oriented x3, in no acute distress.  HEENT: No bruits, no goiter.  Heart: Regular rate and rhythm,  Lungs: Good air movement, clear Abdomen: Soft, nontender, nondistended, positive bowel sounds.    Data Reviewed: Basic Metabolic Panel:  Recent Labs Lab 11/09/13 1918 11/10/13 0156  NA 131*  --   K 3.3*  --   CL 91*  --   CO2 25  --   GLUCOSE 123*  --   BUN 18  --   CREATININE 1.22* 1.02  CALCIUM 9.6  --    Liver Function Tests: No results found for this basename: AST, ALT, ALKPHOS, BILITOT, PROT, ALBUMIN,  in the last 168 hours No results found for this basename: LIPASE, AMYLASE,  in the last 168 hours No results found for this basename: AMMONIA,  in the last 168 hours CBC:  Recent  Labs Lab 11/09/13 1918 11/10/13 0156  WBC 10.8* 8.4  HGB 13.6 12.1  HCT 38.8 34.4*  MCV 89.6 90.3  PLT 247 223   Cardiac Enzymes:  Recent Labs Lab 11/10/13 0156 11/10/13 0630  TROPONINI <0.30 <0.30   BNP (last 3 results)  Recent Labs  10/28/13 1407  PROBNP 30.8   CBG:  Recent Labs Lab 11/09/13 1917  GLUCAP 121*    Recent Results (from the past 240 hour(s))  MRSA PCR SCREENING     Status: None   Collection Time    11/10/13  2:00 AM      Result Value Ref Range Status   MRSA by PCR NEGATIVE  NEGATIVE Final   Comment:            The GeneXpert MRSA Assay (FDA     approved for NASAL specimens     only), is one component of a     comprehensive MRSA colonization     surveillance program. It is not     intended to diagnose MRSA     infection nor to guide or     monitor treatment for     MRSA infections.     Studies: No results found.  Scheduled Meds: . aspirin EC  81 mg Oral Daily  . calcium carbonate  1,250 mg Oral Daily  . cholecalciferol  1,000 Units Oral Daily  . enoxaparin (LOVENOX) injection  40 mg Subcutaneous Q24H  . hydrochlorothiazide  12.5 mg Oral Daily  . loratadine  10 mg Oral Daily  . losartan  100 mg Oral Daily  . pantoprazole  40 mg Oral Daily  . sodium chloride  3 mL Intravenous Q12H  . sodium chloride  3 mL Intravenous Q12H   Continuous Infusions: . sodium chloride 75 mL/hr at 11/10/13 [facility] Pager [phone]. If 8PM-8AM, please contact night-coverage at [email or web address], password [facility] 11/10/2013, 8:08 AM  LOS: 1 day      **Disclaimer: This note may have been dictated with voice recognition software. Similar sounding words can inadvertently be transcribed and this note may contain transcription errors which may not have been corrected upon publication of note.**

## 2013-11-11 DIAGNOSIS — E559 Vitamin D deficiency, unspecified: Secondary | ICD-10-CM

## 2013-11-11 DIAGNOSIS — I10 Essential (primary) hypertension: Secondary | ICD-10-CM | POA: Diagnosis not present

## 2013-11-11 DIAGNOSIS — R55 Syncope and collapse: Secondary | ICD-10-CM | POA: Diagnosis not present

## 2013-11-11 DIAGNOSIS — K219 Gastro-esophageal reflux disease without esophagitis: Secondary | ICD-10-CM | POA: Diagnosis not present

## 2013-11-11 LAB — BASIC METABOLIC PANEL
BUN: 13 mg/dL (ref 6–23)
CHLORIDE: 105 meq/L (ref 96–112)
CO2: 26 mEq/L (ref 19–32)
CREATININE: 0.97 mg/dL (ref 0.50–1.10)
Calcium: 9.1 mg/dL (ref 8.4–10.5)
GFR, EST AFRICAN AMERICAN: 61 mL/min — AB (ref >90)
GFR, EST NON AFRICAN AMERICAN: 53 mL/min — AB (ref >90)
Glucose, Bld: 94 mg/dL (ref 70–99)
POTASSIUM: 4.8 meq/L (ref 3.7–5.3)
Sodium: 140 mEq/L (ref 137–147)

## 2013-11-11 MED ORDER — LOSARTAN POTASSIUM 100 MG PO TABS: 50.0000 mg | ORAL_TABLET | Freq: Every day | ORAL | Status: DC

## 2013-11-11 NOTE — Discharge Summary (Signed)
Physician Discharge Summary  [place] [id number] DOB: 1929/01/24 DOA: 11/09/2013  PCP: Garnet Koyanagi, DO  Admit date: 11/09/2013 Discharge date: 11/11/2013  Time spent: >30 minutes  Recommendations for Outpatient Follow-up:  BMET to follow electrolytes and renal function Reassess BP and adjust medications as needed  Discharge Diagnoses:  HYPERTENSION Syncope Hyponatremia GERD Vit D/Vit B deficiency  dehydration  Discharge Condition: stable and improved.  Diet recommendation: low sodium diet  Filed Weights   11/09/13 1836 11/10/13 0159 11/11/13 0523  Weight: 74.844 kg (165 lb) 76.749 kg (169 lb 3.2 oz) 76.975 kg (169 lb 11.2 oz)    History of present illness:  80 y.o. female, with a recent admission for syncope on one month ago, hypertension presenting today with a similar episode while she was having dinner she was overwhelmed with feelings of warmth and she had a 4 minute syncope witnessed by her husband with no seizure activity. No prior chest pains palpitations. Mild vertiginous feeling was reported prior to episode. No history of trauma to head no shortness of breath cough fever or chills  Hospital Course:  Syncope:  - secondary to orthostatsis . -diuretics has been discontinue and cozaar adjusted to just 50mg  daily -patient advised to follow low sodium diet and to keep herself well hydrated -follow up with PCP in 1 week -no orthostatic changes at discharge -no abnormalities on telemetry  Hyponatremia  -resolved after given IVF's and stopping diuretics  HYPERTENSION  - not a good a candidate for HCTZ  -BP in the [id number] systolic over [id number] diastolic without medications and with IVF's -HCTZ discontinued and cozaar adjusted to just 50mg  daily -reassess in outpatient setting  Vit B and Vit D deficiency  -continue supplementation   GERD: continue PPI    Procedures:  See below for x-ray reports   Consultations:  None   Discharge Exam: Filed  Vitals:   11/11/13 0523  BP: 136/58  Pulse: 88  Temp: 98 F (36.7 C)  Resp: 18   General: Alert, awake, oriented x3, in no acute distress.  HEENT: No bruits, no goiter; No JVD Heart: Regular rate and rhythm, no rubs or gallops Lungs: Good air movement, no wheezing or crackles Abdomen: Soft, nontender, nondistended, positive bowel sounds.    Discharge Instructions You were cared for by a hospitalist during your hospital stay. If you have any questions about your discharge medications or the care you received while you were in the hospital after you are discharged, you can call the unit and asked to speak with the hospitalist on call if the hospitalist that took care of you is not available. Once you are discharged, your primary care physician will handle any further medical issues. Please note that NO REFILLS for any discharge medications will be authorized once you are discharged, as it is imperative that you return to your primary care physician (or establish a relationship with a primary care physician if you do not have one) for your aftercare needs so that they can reassess your need for medications and monitor your lab values.  Discharge Instructions   Diet - low sodium heart healthy    Complete by:  As directed      Discharge instructions    Complete by:  As directed   Keep yourself well hydrated Take medications as prescribed HCTZ has been discontinued due to concerns of orthostatic hypotension leading to syncope Follow a low sodium diet (approx 2000mg -2500mg  daily) Arrange follow up with PCP in 7-10 days  Medication List    STOP taking these medications       hydrochlorothiazide 25 MG tablet  Commonly known as:  HYDRODIURIL     potassium gluconate 595 MG Tabs tablet      TAKE these medications       aspirin 81 MG tablet  Take 81 mg by mouth daily.     calcium carbonate 600 MG Tabs tablet  Commonly known as:  OS-CAL  Take 1,200 mg by mouth daily.      cholecalciferol 1000 UNITS tablet  Commonly known as:  VITAMIN D  Take 1,000 Units by mouth daily.     Iron 325 (65 FE) MG Tabs  Take 1 tablet by mouth daily.     loratadine 10 MG tablet  Commonly known as:  CLARITIN  Take 1 tablet (10 mg total) by mouth daily.     losartan 100 MG tablet  Commonly known as:  COZAAR  Take 0.5 tablets (50 mg total) by mouth daily.     omeprazole 40 MG capsule  Commonly known as:  PRILOSEC  Take 1 capsule (40 mg total) by mouth daily.     vitamin B-12 500 MCG tablet  Commonly known as:  CYANOCOBALAMIN  Take 1,000 mcg by mouth daily.       Allergies  Allergen Reactions  . Codeine Nausea And Vomiting    in large doses: can tolerate Tussionex       Follow-up Information   Follow up with Garnet Koyanagi, DO In 10 days.   Specialty:  Family Medicine   Contact information:   [phone] [place] [address] [phone]       The results of significant diagnostics from this hospitalization (including imaging, microbiology, ancillary and laboratory) are listed below for reference.    Significant Diagnostic Studies: Dg Chest 2 View  10/28/2013   CLINICAL DATA:  Dizziness.  Lightheadedness.  EXAM: CHEST  2 VIEW  COMPARISON:  Chest radiograph 10/19/2013.  FINDINGS: Stable cardiac and mediastinal contours. No consolidative pulmonary opacities. No pleural effusion or pneumothorax. Mid thoracic spine degenerative change.  IMPRESSION: No acute cardiopulmonary process.   Electronically Signed   By: Lovey Newcomer M.D.   On: 10/28/2013 14:56   Dg Chest 2 View  10/19/2013   CLINICAL DATA:  Dizziness, blurred vision, nausea, backache, history hypertension, GERD  EXAM: CHEST  2 VIEW  COMPARISON:  08/10/2012 ; correlation CT chest 03/29/2013  FINDINGS: Upper-normal size of cardiac silhouette.  Mediastinal contours and pulmonary vascularity normal.  Hyperinflation without acute infiltrate, pleural effusion or pneumothorax.  Bones demineralized.   IMPRESSION: Chronic hyperinflation without acute infiltrate.   Electronically Signed   By: Lavonia Dana M.D.   On: 10/19/2013 12:36   Ct Head Wo Contrast  10/19/2013   CLINICAL DATA:  Dizziness and near syncope.  EXAM: CT HEAD WITHOUT CONTRAST  TECHNIQUE: Contiguous axial images were obtained from the base of the skull through the vertex without intravenous contrast.  COMPARISON:  None.  FINDINGS: There is no evidence of acute cortical infarct, intracranial hemorrhage, mass, midline shift, or extra-axial fluid collection. Periventricular white-matter hypodensities are nonspecific but compatible with moderate chronic small vessel ischemic disease. Age-related cerebral atrophy is present. Orbits are unremarkable. Mastoid air cells and visualized paranasal sinuses are clear.  IMPRESSION: 1. No evidence of acute intracranial abnormality. 2. Moderate chronic small vessel ischemic disease.   Electronically Signed   By: Logan Bores   On: 10/19/2013 12:19   Nm Myocar Multi W/spect  W/wall Motion / Ef  10/30/2013   CLINICAL DATA:  [place] Angelillo is an 80 yo who was admitted with chest pain. She was scheduled for a Lexiscan Myoview for further evaluation.  EXAM: MYOCARDIAL IMAGING WITH SPECT (REST AND PHARMACOLOGIC-STRESS)  GATED LEFT VENTRICULAR WALL MOTION STUDY  LEFT VENTRICULAR EJECTION FRACTION  TECHNIQUE: Standard myocardial SPECT imaging was performed after resting intravenous injection of 10 mCi Tc-5m sestamibi. Subsequently, intravenous infusion of Lexiscan was performed under the supervision of the Cardiology staff. At peak effect of the drug, 30 mCi Tc-32m sestamibi was injected intravenously and standard myocardial SPECT imaging was performed. Quantitative gated imaging was also performed to evaluate left ventricular wall motion, and estimate left ventricular ejection fraction.  COMPARISON:  None.  FINDINGS: There were no ECG changes to suggest ischemia. The raw data images reveal no significant motion  artifact.  The stress scintigraphic images reveals fairly smooth and homogeneous uptake in all areas of the myocardium. The resting images reveal a similar pattern of smooth and homogeneous uptake in all areas of the myocardium.  The quantitated gated SPECT images reveal an end-diastolic volume of 43 mL with an end-systolic volume of 7 mm. This is an ejection fraction of 83%. There is normal left ventricular contractility.  IMPRESSION: This is interpreted as a negative Lexiscan Myoview study. She has no evidence of ischemia. She has normal left ventricular systolic function with an ejection fraction of 83%.   Electronically Signed   By: Mertie Moores   On: 10/30/2013 13:45    Microbiology: Recent Results (from the past 240 hour(s))  MRSA PCR SCREENING     Status: None   Collection Time    11/10/13  2:00 AM      Result Value Ref Range Status   MRSA by PCR NEGATIVE  NEGATIVE Final   Comment:            The GeneXpert MRSA Assay (FDA     approved for NASAL specimens     only), is one component of a     comprehensive MRSA colonization     surveillance program. It is not     intended to diagnose MRSA     infection nor to guide or     monitor treatment for     MRSA infections.     Labs: Basic Metabolic Panel:  Recent Labs Lab 11/09/13 1918 11/10/13 0156 11/10/13 0630 11/11/13 0404  NA 131*  --  135* 140  K 3.3*  --  4.3 4.8  CL 91*  --  98 105  CO2 25  --  25 26  GLUCOSE 123*  --  103* 94  BUN 18  --  14 13  CREATININE 1.22* 1.02 1.02 0.97  CALCIUM 9.6  --  9.0 9.1   CBC:  Recent Labs Lab 11/09/13 1918 11/10/13 0156  WBC 10.8* 8.4  HGB 13.6 12.1  HCT 38.8 34.4*  MCV 89.6 90.3  PLT 247 223   Cardiac Enzymes:  Recent Labs Lab 11/10/13 0156 11/10/13 0630 11/10/13 1322  TROPONINI <0.30 <0.30 <0.30   BNP: BNP (last 3 results)  Recent Labs  10/28/13 1407  PROBNP 30.8   CBG:  Recent Labs Lab 11/09/13 1917  GLUCAP 121*    Signed:  Barton Dubois  [facility] 11/11/2013, 1:19 PM

## 2013-11-12 ENCOUNTER — Telehealth: Payer: Self-pay

## 2013-11-12 NOTE — Telephone Encounter (Signed)
Admitted [place]: 11/09/13  Discharged to [facility] at [place]:  11/11/13  Pt was last seen in the office on 11/08/13 for a hospital follow up for dizziness.  The next day the patient was readmitted into the hospital for a syncopal episode.  Pt stated that she was having dinner with her husband when she "passed out."  According to EMS the episode lasted 45 seconds.  She was told that the syncopal episode was due to low BP (EKG SR, BP 101/67, CBG 123).  Note: Pt has had some recent changes to her blood pressure medications.  She was taken to the hospital,evaluated, treated and discharged on 11/11/13.    She is routinely having her BPs monitored by a nurse at [facility].  Her BP this morning was 150/90 (without medications).  Nurse is suppose to come back later this afternoon to repeat her blood pressure.  Pt denies dizziness, chest pain, shortness of breath, and feelings of fainting.  She did express that she felt tired and weak from being in the hospital.  However, she is still able to ambulate on her own without assistance and is independent of her ADLs.    Medication and allergies:  Reviewed and updated  New/changes to medications:  Coreg was discontinued and was started on Amlodipine 2.5 mg by her Cardiologist.  Hydrochlorothiazide was  discontinued and losartan was reduced to 1/2 tablet or 50 mg daily.    90 day supply/mail order: [place], [place] pharmacy:  [facility] [place], [place], family history and past surgical hx: Reviewed and updated   Pt was advised that if she starts to experience dizziness, has another syncopal episode or begins to experience chest pain, abdominal pain, or shortness of breath to call EMS.  Pt stated understanding and agreed.   Hospital Follow Up scheduled with Dr. Etter Sjogren on Friday, November 16, 2013 @ 11:30 am.

## 2013-11-14 ENCOUNTER — Ambulatory Visit: Payer: Medicare Other | Admitting: Family Medicine

## 2013-11-16 ENCOUNTER — Ambulatory Visit (INDEPENDENT_AMBULATORY_CARE_PROVIDER_SITE_OTHER): Payer: Medicare Other | Admitting: Family Medicine

## 2013-11-16 ENCOUNTER — Encounter: Payer: Self-pay | Admitting: Family Medicine

## 2013-11-16 VITALS — BP 148/84 | HR 92 | Temp 98.5°F | Wt 167.0 lb

## 2013-11-16 DIAGNOSIS — R269 Unspecified abnormalities of gait and mobility: Secondary | ICD-10-CM

## 2013-11-16 DIAGNOSIS — R42 Dizziness and giddiness: Secondary | ICD-10-CM | POA: Diagnosis not present

## 2013-11-16 DIAGNOSIS — R55 Syncope and collapse: Secondary | ICD-10-CM | POA: Diagnosis not present

## 2013-11-16 DIAGNOSIS — E871 Hypo-osmolality and hyponatremia: Secondary | ICD-10-CM | POA: Diagnosis not present

## 2013-11-16 LAB — BASIC METABOLIC PANEL
BUN: 16 mg/dL (ref 6–23)
CO2: 28 meq/L (ref 19–32)
CREATININE: 1.1 mg/dL (ref 0.4–1.2)
Calcium: 10.1 mg/dL (ref 8.4–10.5)
Chloride: 101 mEq/L (ref 96–112)
GFR: 52.6 mL/min — ABNORMAL LOW (ref >60.00)
Glucose, Bld: 94 mg/dL (ref 70–99)
POTASSIUM: 5.3 meq/L — AB (ref 3.5–5.1)
Sodium: 139 mEq/L (ref 135–145)

## 2013-11-16 LAB — VITAMIN B12: Vitamin B-12: >1500 pg/mL — ABNORMAL HIGH (ref 211–911)

## 2013-11-16 NOTE — Progress Notes (Signed)
Pre visit review using our clinic review tool, if applicable. No additional management support is needed unless otherwise documented below in the visit note. 

## 2013-11-16 NOTE — Progress Notes (Signed)
Subjective:    Patient ID: Deanna Shields, female    DOB: 11/21/1928, 80 y.o.   MRN: [id number]  HPI Pt here f/u f/u from hospital.  Pt had an episode of syncope at home.  She felt completely fine prior to to incident.  See d/c summary.  Pt still feeling tired but better.  No cp or sob.     Review of Systems As above  Past Medical History  Diagnosis Date  . Hypertension   . Rhinitis, allergic   . Hemorrhoids 2004    Colonoscopy  . Hiatal hernia 2004    EGD  . Esophageal stricture 2009    EGD   . GERD (gastroesophageal reflux disease) 2004    EGD  . Arthritis   . Dizziness   . Syncopal episodes    History   Social History  . Marital Status: Married    Spouse Name: N/A    Number of Children: 0  . Years of Education: N/A   Occupational History  . retired     Geneticist, molecular   Social History Main Topics  . Smoking status: Never Smoker   . Smokeless tobacco: Never Used  . Alcohol Use: No  . Drug Use: No  . Sexual Activity: Not on file   Other Topics Concern  . Not on file   Social History Narrative   Patient doesn't get regular exercise   married   Family History  Problem Relation Age of Onset  . Stroke Father 62    light stroke  . Heart disease Sister   . Coronary artery disease Neg Hx   . Diabetes Neg Hx   . Cancer Neg Hx     breast, colon, prostate   Current Outpatient Prescriptions  Medication Sig Dispense Refill  . amLODipine (NORVASC) 2.5 MG tablet Take 2.5 mg by mouth daily.      [name] aspirin 81 MG tablet Take 81 mg by mouth daily.       . calcium carbonate (OS-CAL) 600 MG TABS Take 1,200 mg by mouth daily.       . cholecalciferol (VITAMIN D) 1000 UNITS tablet Take 1,000 Units by mouth daily.        . Ferrous Sulfate (IRON) 325 (65 FE) MG TABS Take 1 tablet by mouth daily.      [name] loratadine (CLARITIN) 10 MG tablet Take 1 tablet (10 mg total) by mouth daily.  90 tablet  3  . losartan (COZAAR) 100 MG tablet Take 0.5 tablets (50 mg total) by  mouth daily.      [name] omeprazole (PRILOSEC) 40 MG capsule Take 1 capsule (40 mg total) by mouth daily.  90 capsule  3  . vitamin B-12 (CYANOCOBALAMIN) 500 MCG tablet Take 1,000 mcg by mouth daily.       No current facility-administered medications for this visit.       Objective:   Physical Exam  BP 148/84  Pulse 92  Temp(Src) 98.5 F (36.9 C) (Oral)  Wt 167 lb (75.751 kg)  SpO2 97% General appearance: alert, cooperative, appears stated age and no distress Throat: lips, mucosa, and tongue normal; teeth and gums normal Neck: no adenopathy, no carotid bruit, no JVD, supple, symmetrical, trachea midline and thyroid not enlarged, symmetric, no tenderness/mass/nodules Lungs: clear to auscultation bilaterally Heart: S1, S2 normal Extremities: extremities normal, atraumatic, no cyanosis or edema Neurologic: Grossly normal       Assessment & Plan:  1. Syncope 1 x only-- better since  Na back up and bp stable rto if symptoms return - MR Brain Wo Contrast; Future - US Carotid Duplex Bilateral; Future - Vitamin B12  2. Dizziness and giddiness   - MR Brain Wo Contrast; Future - US Carotid Duplex Bilateral; Future - Vitamin B12  3. Hyponatremia Check labs - Basic metabolic panel Off hct 4. htn-- Stable on lower dose of med and no hct

## 2013-11-16 NOTE — Patient Instructions (Signed)
Syncope  Syncope is a fainting spell. This means the person loses consciousness and drops to the ground. The person is generally unconscious for less than 5 minutes. The person may have some muscle twitches for up to 15 seconds before waking up and returning to normal. Syncope occurs more often in elderly people, but it can happen to anyone. While most causes of syncope are not dangerous, syncope can be a sign of a serious medical problem. It is important to seek medical care.   CAUSES   Syncope is caused by a sudden decrease in blood flow to the brain. The specific cause is often not determined. Factors that can trigger syncope include:   Taking medicines that lower blood pressure.   Sudden changes in posture, such as standing up suddenly.   Taking more medicine than prescribed.   Standing in one place for too long.   Seizure disorders.   Dehydration and excessive exposure to heat.   Low blood sugar (hypoglycemia).   Straining to have a bowel movement.   Heart disease, irregular heartbeat, or other circulatory problems.   Fear, emotional distress, seeing blood, or severe pain.  SYMPTOMS   Right before fainting, you may:   Feel dizzy or lightheaded.   Feel nauseous.   See all white or all black in your field of vision.   Have cold, clammy skin.  DIAGNOSIS   Your caregiver will ask about your symptoms, perform a physical exam, and perform electrocardiography (ECG) to record the electrical activity of your heart. Your caregiver may also perform other heart or blood tests to determine the cause of your syncope.  TREATMENT   In most cases, no treatment is needed. Depending on the cause of your syncope, your caregiver may recommend changing or stopping some of your medicines.  HOME CARE INSTRUCTIONS   Have someone stay with you until you feel stable.   Do not drive, operate machinery, or play sports until your caregiver says it is okay.   Keep all follow-up appointments as directed by your  caregiver.   Lie down right away if you start feeling like you might faint. Breathe deeply and steadily. Wait until all the symptoms have passed.   Drink enough fluids to keep your urine clear or pale yellow.   If you are taking blood pressure or heart medicine, get up slowly, taking several minutes to sit and then stand. This can reduce dizziness.  SEEK IMMEDIATE MEDICAL CARE IF:    You have a severe headache.   You have unusual pain in the chest, abdomen, or back.   You are bleeding from the mouth or rectum, or you have black or tarry stool.   You have an irregular or very fast heartbeat.   You have pain with breathing.   You have repeated fainting or seizure-like jerking during an episode.   You faint when sitting or lying down.   You have confusion.   You have difficulty walking.   You have severe weakness.   You have vision problems.  If you fainted, call your local emergency services (911 in [place] Do not drive yourself to the hospital.   MAKE SURE YOU:   Understand these instructions.   Will watch your condition.   Will get help right away if you are not doing well or get worse.  Document Released: 05/24/2005 Document Revised: 11/23/2011 Document Reviewed: 07/23/2011  ExitCare Patient Information 2014 [employer].

## 2013-11-19 ENCOUNTER — Ambulatory Visit (HOSPITAL_BASED_OUTPATIENT_CLINIC_OR_DEPARTMENT_OTHER)
Admission: RE | Admit: 2013-11-19 | Discharge: 2013-11-19 | Disposition: A | Discharge status: Home / Self Care | Payer: Medicare Other | Source: Ambulatory Visit | Attending: Family Medicine | Admitting: Family Medicine

## 2013-11-19 DIAGNOSIS — R55 Syncope and collapse: Secondary | ICD-10-CM | POA: Insufficient documentation

## 2013-11-19 DIAGNOSIS — I658 Occlusion and stenosis of other precerebral arteries: Secondary | ICD-10-CM | POA: Diagnosis not present

## 2013-11-19 DIAGNOSIS — R42 Dizziness and giddiness: Secondary | ICD-10-CM

## 2013-11-20 ENCOUNTER — Ambulatory Visit (HOSPITAL_BASED_OUTPATIENT_CLINIC_OR_DEPARTMENT_OTHER)
Admission: RE | Admit: 2013-11-20 | Discharge: 2013-11-20 | Disposition: A | Discharge status: Home / Self Care | Payer: Medicare Other | Source: Ambulatory Visit | Attending: Family Medicine | Admitting: Family Medicine

## 2013-11-20 DIAGNOSIS — I10 Essential (primary) hypertension: Secondary | ICD-10-CM | POA: Insufficient documentation

## 2013-11-20 DIAGNOSIS — R55 Syncope and collapse: Secondary | ICD-10-CM | POA: Insufficient documentation

## 2013-11-20 DIAGNOSIS — R42 Dizziness and giddiness: Secondary | ICD-10-CM | POA: Diagnosis not present

## 2013-11-20 DIAGNOSIS — R930 Abnormal findings on diagnostic imaging of skull and head, not elsewhere classified: Secondary | ICD-10-CM | POA: Diagnosis not present

## 2013-11-21 ENCOUNTER — Other Ambulatory Visit: Payer: Self-pay

## 2013-11-21 DIAGNOSIS — E875 Hyperkalemia: Secondary | ICD-10-CM

## 2013-11-22 ENCOUNTER — Other Ambulatory Visit (INDEPENDENT_AMBULATORY_CARE_PROVIDER_SITE_OTHER): Payer: Medicare Other

## 2013-11-22 DIAGNOSIS — E875 Hyperkalemia: Secondary | ICD-10-CM | POA: Diagnosis not present

## 2013-11-22 LAB — BASIC METABOLIC PANEL
BUN: 13 mg/dL (ref 6–23)
CALCIUM: 9.8 mg/dL (ref 8.4–10.5)
CO2: 28 meq/L (ref 19–32)
Chloride: 100 mEq/L (ref 96–112)
Creatinine, Ser: 0.9 mg/dL (ref 0.4–1.2)
GFR: 61.94 mL/min (ref >60.00)
GLUCOSE: 94 mg/dL (ref 70–99)
Potassium: 4.7 mEq/L (ref 3.5–5.1)
Sodium: 137 mEq/L (ref 135–145)

## 2013-11-27 ENCOUNTER — Telehealth: Payer: Self-pay | Admitting: Family Medicine

## 2013-11-27 MED ORDER — HYDROCOD POLST-CHLORPHEN POLST 10-8 MG/5ML PO LQCR: 5.0000 mL | Freq: Two times a day (BID) | ORAL | Status: DC | PRN

## 2013-11-27 NOTE — Telephone Encounter (Signed)
Last seen 11/16/13 and filled 10/01/13 #140.  Please advise   KP

## 2013-11-27 NOTE — Telephone Encounter (Signed)
Caller name: [place] Relation to pt: patient Call back number: [phone] Pharmacy: [facility] [address] [place]   Reason for call:  To request a refill for Tussionex for her cough. Also she would like for Kim to give her a call back.

## 2013-11-27 NOTE — Telephone Encounter (Signed)
Patient aware Rx ready for pick up.     KP   Notes Recorded by Ewing Schlein, CMA on 11/22/2013 at 4:25 PM The patient is taking and OTC potassium supplement, do you want her to re-start?   Per Dr.Lowne the patient is to not take the potassium for 3 mos and we will re-check then.  She voiced understanding and agreed  KP

## 2013-11-27 NOTE — Telephone Encounter (Signed)
Refill x1 

## 2013-12-04 ENCOUNTER — Ambulatory Visit (HOSPITAL_BASED_OUTPATIENT_CLINIC_OR_DEPARTMENT_OTHER): Payer: Medicare Other

## 2013-12-25 ENCOUNTER — Encounter: Payer: Self-pay | Admitting: Cardiovascular Disease

## 2013-12-25 ENCOUNTER — Ambulatory Visit (INDEPENDENT_AMBULATORY_CARE_PROVIDER_SITE_OTHER): Payer: Medicare Other | Admitting: Cardiovascular Disease

## 2013-12-25 VITALS — BP 152/83 | HR 83 | Ht 65.0 in | Wt 167.5 lb

## 2013-12-25 DIAGNOSIS — R079 Chest pain, unspecified: Secondary | ICD-10-CM | POA: Diagnosis not present

## 2013-12-25 DIAGNOSIS — R55 Syncope and collapse: Secondary | ICD-10-CM | POA: Diagnosis not present

## 2013-12-25 NOTE — Progress Notes (Signed)
12/25/2013 [place]   05/12/1929  [id number]  Primary Physician Garnet Koyanagi, DO Primary Cardiologist: [name] MD Deanna Shields   HPI:  The patient is a delightful 80 year old, mildly overweight, married Caucasian female with no children whose husband [name] is also a long-term patient of mine. Unfortunately, he has mesothelioma treated by Dr. Julien Nordmann secondary to remote asbestos exposure. The patient is being seen today because of episodes of chest pain and shortness of breath.   She was remotely a patient of Dr. Alla German. She had a cath done by him Oct 23, 1999, after an abnormal Myoview done because of chest pain. Her risk factors at that time were family history and hyperlipidemia. She does not smoke or drink alcohol. She has been on statin drugs in the past.  I last saw her in the office December 2013. She was admitted to the hospital 10/28/13 for 2 days because of chest pain/rule out myocardial function. A 2-D echo and Myoview stress test were normal. She was seen back a week later because of witnessed syncope. She's had no recurrent chest pain.    Current Outpatient Prescriptions  Medication Sig Dispense Refill  . amLODipine (NORVASC) 2.5 MG tablet Take 2.5 mg by mouth daily.      [name] aspirin 81 MG tablet Take 81 mg by mouth daily.       [name] loratadine (CLARITIN) 10 MG tablet Take 1 tablet (10 mg total) by mouth daily.  90 tablet  3  . losartan (COZAAR) 100 MG tablet Take 0.5 tablets (50 mg total) by mouth daily.      [name] omeprazole (PRILOSEC) 40 MG capsule Take 1 capsule (40 mg total) by mouth daily.  90 capsule  3   No current facility-administered medications for this visit.    Allergies  Allergen Reactions  . Codeine Nausea And Vomiting    in large doses: can tolerate Tussionex    History   Social History  . Marital Status: Married    Spouse Name: N/A    Number of Children: 0  . Years of Education: N/A   Occupational History  .  retired     Geneticist, molecular   Social History Main Topics  . Smoking status: Never Smoker   . Smokeless tobacco: Never Used  . Alcohol Use: No  . Drug Use: No  . Sexual Activity: Not on file   Other Topics Concern  . Not on file   Social History Narrative   Patient doesn't get regular exercise   married     Review of Systems: General: negative for chills, fever, night sweats or weight changes.  Cardiovascular: negative for chest pain, dyspnea on exertion, edema, orthopnea, palpitations, paroxysmal nocturnal dyspnea or shortness of breath Dermatological: negative for rash Respiratory: negative for cough or wheezing Urologic: negative for hematuria Abdominal: negative for nausea, vomiting, diarrhea, bright red blood per rectum, melena, or hematemesis Neurologic: negative for visual changes, syncope, or dizziness All other systems reviewed and are otherwise negative except as noted above.    Blood pressure 152/83, pulse 83, height 5\' 5"  (1.651 m), weight 167 lb 8 oz (75.978 kg).  General appearance: alert and no distress Neck: no adenopathy, no carotid bruit, no JVD, supple, symmetrical, trachea midline and thyroid not enlarged, symmetric, no tenderness/mass/nodules Lungs: clear to auscultation bilaterally Heart: regular rate and rhythm, S1, S2 normal, no murmur, click, rub or gallop Extremities: extremities normal, atraumatic, no cyanosis or edema  EKG not performed  today  ASSESSMENT AND PLAN:   Syncope Patient had an episode of witnessed syncope while eating dinner with her family. Her workup was unrevealing. 2-D echo was normal. Dr. Etter Sjogren and apparently obtained a carotid Doppler study. I am going to get a one-month event monitor. I counseled her about not be allowed to drive for 6 months.  Chest pain Patient has a long history of chest pain dating back to 2001 which underwent cardiac catheterization by Dr. Tami Ribas 10/23/99 because of chest pain and was found to have  essentially normal coronary arteries. She had a Myoview 06/01/12 which was normal and again a negative stress test 2 months ago during an admission for chest pain.      [name] MD FACP,FACC,FAHA, [facility] 12/25/2013 10:37 AM

## 2013-12-25 NOTE — Assessment & Plan Note (Signed)
Patient has a long history of chest pain dating back to 2001 which underwent cardiac catheterization by Dr. Tami Ribas 10/23/99 because of chest pain and was found to have essentially normal coronary arteries. She had a Myoview 06/01/12 which was normal and again a negative stress test 2 months ago during an admission for chest pain.

## 2013-12-25 NOTE — Assessment & Plan Note (Signed)
Patient had an episode of witnessed syncope while eating dinner with her family. Her workup was unrevealing. 2-D echo was normal. Dr. Etter Sjogren and apparently obtained a carotid Doppler study. I am going to get a one-month event monitor. I counseled her about not be allowed to drive for 6 months.

## 2013-12-25 NOTE — Patient Instructions (Signed)
  We will see you back in follow up in 6 months with Dr [name].   Dr [name] has ordered:  Event monitor to be worn for 30 days. Event monitors are medical devices that record the heart's electrical activity. Doctors most often [place] these monitors to diagnose arrhythmias. Arrhythmias are problems with the speed or rhythm of the heartbeat. The monitor is a small, portable device. You can wear one while you do your normal daily activities. This is usually used to diagnose what is causing palpitations/syncope (passing out).

## 2014-01-08 ENCOUNTER — Telehealth: Payer: Self-pay | Admitting: Cardiovascular Disease

## 2014-01-08 NOTE — Telephone Encounter (Signed)
Pt called and said she have used her last battery for her monitor. Please call her and tell her what to do.

## 2014-01-08 NOTE — Telephone Encounter (Signed)
Left VM instructing patient to use AA battery and call the 1800 # on pamphlet to request more batteries and stickers to last duration of 30 days of event monitor.

## 2014-01-22 ENCOUNTER — Other Ambulatory Visit: Payer: Self-pay | Admitting: Family Medicine

## 2014-01-31 ENCOUNTER — Telehealth: Payer: Self-pay | Admitting: *Deleted

## 2014-01-31 NOTE — Telephone Encounter (Signed)
Ms Amborn walked in and brought the charger for cardionet monitor.  She forgot to send it back in the package with the remaining parts of her monitor.  I mailed the charger and patient's information to [employer] on 01/31/14.

## 2014-02-04 ENCOUNTER — Encounter: Payer: Self-pay | Admitting: *Deleted

## 2014-02-15 ENCOUNTER — Encounter: Payer: Self-pay | Admitting: Family Medicine

## 2014-02-15 ENCOUNTER — Ambulatory Visit (INDEPENDENT_AMBULATORY_CARE_PROVIDER_SITE_OTHER): Payer: Medicare Other | Admitting: Family Medicine

## 2014-02-15 VITALS — BP 131/73 | HR 88 | Temp 98.6°F | Wt 167.1 lb

## 2014-02-15 DIAGNOSIS — R059 Cough, unspecified: Secondary | ICD-10-CM | POA: Diagnosis not present

## 2014-02-15 DIAGNOSIS — I10 Essential (primary) hypertension: Secondary | ICD-10-CM | POA: Diagnosis not present

## 2014-02-15 DIAGNOSIS — R05 Cough: Secondary | ICD-10-CM | POA: Diagnosis not present

## 2014-02-15 DIAGNOSIS — E785 Hyperlipidemia, unspecified: Secondary | ICD-10-CM | POA: Diagnosis not present

## 2014-02-15 LAB — BASIC METABOLIC PANEL
BUN: 15 mg/dL (ref 6–23)
CHLORIDE: 100 meq/L (ref 96–112)
CO2: 30 meq/L (ref 19–32)
CREATININE: 1.3 mg/dL — AB (ref 0.4–1.2)
Calcium: 9.8 mg/dL (ref 8.4–10.5)
GFR: 42.67 mL/min — ABNORMAL LOW (ref >60.00)
Glucose, Bld: 84 mg/dL (ref 70–99)
POTASSIUM: 5 meq/L (ref 3.5–5.1)
Sodium: 137 mEq/L (ref 135–145)

## 2014-02-15 LAB — HEPATIC FUNCTION PANEL
ALK PHOS: 80 U/L (ref 39–117)
ALT: 16 U/L (ref 0–35)
AST: 28 U/L (ref 0–37)
Albumin: 4 g/dL (ref 3.5–5.2)
BILIRUBIN DIRECT: 0.1 mg/dL (ref 0.0–0.3)
BILIRUBIN TOTAL: 0.8 mg/dL (ref 0.2–1.2)
Total Protein: 6.8 g/dL (ref 6.0–8.3)

## 2014-02-15 LAB — LIPID PANEL
CHOL/HDL RATIO: 5
Cholesterol: 218 mg/dL — ABNORMAL HIGH (ref 0–200)
HDL: 44.1 mg/dL (ref >39.00)
LDL CALC: 146 mg/dL — AB (ref 0–99)
NONHDL: 173.9
Triglycerides: 142 mg/dL (ref 0.0–149.0)
VLDL: 28.4 mg/dL (ref 0.0–40.0)

## 2014-02-15 MED ORDER — HYDROCOD POLST-CHLORPHEN POLST 10-8 MG/5ML PO LQCR: 5.0000 mL | Freq: Two times a day (BID) | ORAL | Status: DC | PRN

## 2014-02-15 NOTE — Progress Notes (Signed)
  Subjective:    Patient here for follow-up of elevated blood pressure.  She is not exercising and is adherent to a low-salt diet.  Blood pressure is well controlled at home. Cardiac symptoms: none. Patient denies: chest pain, chest pressure/discomfort, claudication, dyspnea, exertional chest pressure/discomfort, fatigue, irregular heart beat, lower extremity edema, near-syncope, orthopnea, palpitations, paroxysmal nocturnal dyspnea, syncope and tachypnea. Cardiovascular risk factors: advanced age (older than 52 for men, 33 for women), dyslipidemia, hypertension and sedentary lifestyle. Use of agents associated with hypertension: none. History of target organ damage: none.  The following portions of the patient's history were reviewed and updated as appropriate: allergies, current medications, past family history, past medical history, past social history, past surgical history and problem list.  Review of Systems Pertinent items are noted in HPI.     Objective:    BP 131/73  Pulse 88  Temp(Src) 98.6 F (37 C) (Oral)  Wt 167 lb 1.7 oz (75.8 kg)  SpO2 95% General appearance: alert, cooperative, appears stated age and no distress Throat: lips, mucosa, and tongue normal; teeth and gums normal Neck: no adenopathy, supple, symmetrical, trachea midline and thyroid not enlarged, symmetric, no tenderness/mass/nodules Lungs: clear to auscultation bilaterally Heart: S1, S2 normal Extremities: extremities normal, atraumatic, no cyanosis or edema    Assessment:    Hypertension, normal blood pressure . Evidence of target organ damage: none.    Plan:    Medication: no change. Dietary sodium restriction. Regular aerobic exercise. Follow up: 6 months and as needed.   1. Other and unspecified hyperlipidemia Check labs - Hepatic function panel - Lipid panel  2. Essential hypertension stable - Basic metabolic panel  3. Cough chronic - chlorpheniramine-HYDROcodone (TUSSIONEX PENNKINETIC  ER) 10-8 MG/5ML LQCR; Take 5 mLs by mouth every 12 (twelve) hours as needed.  Dispense: 140 mL; Refill: 0

## 2014-02-15 NOTE — Patient Instructions (Signed)

## 2014-02-15 NOTE — Progress Notes (Signed)
Pre visit review using our clinic review tool, if applicable. No additional management support is needed unless otherwise documented below in the visit note. 

## 2014-02-21 ENCOUNTER — Telehealth: Payer: Self-pay | Admitting: Family Medicine

## 2014-02-21 DIAGNOSIS — E2839 Other primary ovarian failure: Secondary | ICD-10-CM

## 2014-02-21 NOTE — Telephone Encounter (Signed)
Spoke with patient and she stated Dr.Lownr was supposed to ordered her a BMD and she has not heard anything yet. I advised I will put in the order and she can call the [facility] radiology to schedule. She voiced understanding and has agreed to do so.     KP

## 2014-02-21 NOTE — Telephone Encounter (Signed)
Caller name: [place]  Relation to pt: self  Call back number: [phone]   Reason for call: pt inquiring about bone density orders. Please advise

## 2014-02-25 ENCOUNTER — Telehealth: Payer: Self-pay | Admitting: Family Medicine

## 2014-02-25 MED ORDER — PROMETHAZINE HCL 25 MG/ML IJ SOLN: 25.0000 mg | Freq: Once | INTRAMUSCULAR | Status: DC

## 2014-02-25 MED ORDER — PROMETHAZINE HCL 25 MG PO TABS: 25.0000 mg | ORAL_TABLET | Freq: Four times a day (QID) | ORAL | Status: DC | PRN

## 2014-02-25 NOTE — Telephone Encounter (Signed)
Nurse states that her condition right now, she feels the pt needs some relief before going to the er or urgent care. Wants to know if dr.lowne will provide an order to get promethazine , states they have it by mouth, suppository, or shot. States husband does not feel comfortable taking her to an urgent care right now. Fax order to [phone] [facility].

## 2014-02-25 NOTE — Telephone Encounter (Signed)
Spoke with [place] and she voiced understanding or the orders, both have been faxed to [facility] at [phone].    KP

## 2014-02-25 NOTE — Telephone Encounter (Signed)
Information provided by patient and nurse Cleone Slim, LPN  C/o:  Sudden onset of constant nausea/vomiting/diarrhea since 2 pm today.   Afebrile, Temp- 97.6. BP and pulse stable 1130/78, HR 66.  +weakness and lack of appetite.  Denies blood in emesis and stool.  According to [facility], LPN, this is a common experience for the patient.  Typically she continues to vomit until she can't anymore.  The last time, pt ended up in the hospital.  Advice:  No appointment available.  Go to Urgent Care now.  Pt and LPN stated understanding and agreed with plan.

## 2014-02-25 NOTE — Telephone Encounter (Signed)
Caller name: Cleone Slim Relation to [id number] Nurse Call back [phone] ext [phone] Pharmacy:  Reason for call: pt is having dry vomitting, nurse states that it continues to happen, pt also has diarrhea, pt states she feels very sick. Nurse requesting a call back.

## 2014-02-25 NOTE — Telephone Encounter (Signed)
Phenergan 25 mg IM x1 then po qid prn #30  If she has any vomiting after shot she needs to go to [facility]

## 2014-02-28 ENCOUNTER — Ambulatory Visit (INDEPENDENT_AMBULATORY_CARE_PROVIDER_SITE_OTHER)
Admission: RE | Admit: 2014-02-28 | Discharge: 2014-02-28 | Disposition: A | Discharge status: Home / Self Care | Payer: Medicare Other | Source: Ambulatory Visit | Attending: Family Medicine | Admitting: Family Medicine

## 2014-02-28 DIAGNOSIS — E2839 Other primary ovarian failure: Secondary | ICD-10-CM | POA: Diagnosis not present

## 2014-03-19 ENCOUNTER — Other Ambulatory Visit: Payer: Medicare Other

## 2014-03-20 ENCOUNTER — Ambulatory Visit (INDEPENDENT_AMBULATORY_CARE_PROVIDER_SITE_OTHER): Payer: Medicare Other | Admitting: *Deleted

## 2014-03-20 DIAGNOSIS — Z23 Encounter for immunization: Secondary | ICD-10-CM | POA: Diagnosis not present

## 2014-03-26 ENCOUNTER — Ambulatory Visit: Payer: Medicare Other

## 2014-04-02 ENCOUNTER — Ambulatory Visit: Payer: Medicare Other

## 2014-05-21 DIAGNOSIS — M5136 Other intervertebral disc degeneration, lumbar region: Secondary | ICD-10-CM | POA: Diagnosis not present

## 2014-06-03 ENCOUNTER — Telehealth: Payer: Self-pay | Admitting: Family Medicine

## 2014-06-03 DIAGNOSIS — R05 Cough: Secondary | ICD-10-CM

## 2014-06-03 DIAGNOSIS — R059 Cough, unspecified: Secondary | ICD-10-CM

## 2014-06-03 MED ORDER — HYDROCOD POLST-CHLORPHEN POLST 10-8 MG/5ML PO LQCR: 5.0000 mL | Freq: Two times a day (BID) | ORAL | Status: DC | PRN

## 2014-06-03 NOTE — Telephone Encounter (Signed)
tussinex refill

## 2014-06-03 NOTE — Telephone Encounter (Signed)
Rx faxed.    KP 

## 2014-06-03 NOTE — Telephone Encounter (Signed)
VM left advising Rx ready for pick up.     KP 

## 2014-06-03 NOTE — Telephone Encounter (Signed)
Last seen and filled 02/15/14 #114ml. Please advise     KP

## 2014-06-03 NOTE — Telephone Encounter (Signed)
Refill x1 

## 2014-06-06 ENCOUNTER — Telehealth: Payer: Self-pay | Admitting: Family Medicine

## 2014-06-06 NOTE — Telephone Encounter (Signed)
Caller name: [place], [place] D Relation to pt: self  Call back number: [phone]   Reason for call:  Pt states spouse [name] [place] will be picking up chlorpheniramine-HYDROcodone (TUSSIONEX PENNKINETIC ER) 10-8 MG/5ML LQCR.

## 2014-06-13 ENCOUNTER — Telehealth: Payer: Self-pay

## 2014-06-13 NOTE — Telephone Encounter (Signed)
Which [facility]

## 2014-06-13 NOTE — Telephone Encounter (Signed)
[place] called to say that [place] needs her promethazine (PHENERGAN) 25 MG/ML injection sent to the [facility]

## 2014-06-14 NOTE — Telephone Encounter (Signed)
[facility] [place], [place] is the one in her chart

## 2014-06-15 ENCOUNTER — Other Ambulatory Visit: Payer: Self-pay | Admitting: Family Medicine

## 2014-06-25 ENCOUNTER — Telehealth: Payer: Self-pay | Admitting: Family Medicine

## 2014-06-25 NOTE — Telephone Encounter (Signed)
It should come from cardiology if they wrote it

## 2014-06-25 NOTE — Telephone Encounter (Signed)
Caller name: [place], [place] D Relation to pt: self  Call back number: [phone] Pharmacy: [facility]   Reason for call:  Pt requesting a refill amLODipine (NORVASC) 2.5 MG tablet

## 2014-06-25 NOTE — Telephone Encounter (Signed)
Rx given to the patient by the Cardiologist in 6/15. Please advise if this refill is appropriate.     KP

## 2014-06-26 MED ORDER — AMLODIPINE BESYLATE 2.5 MG PO TABS: 2.5000 mg | ORAL_TABLET | Freq: Every day | ORAL | Status: DC

## 2014-06-26 NOTE — Telephone Encounter (Signed)
Rx refill sent to patient pharmacy   

## 2014-07-03 ENCOUNTER — Telehealth: Payer: Self-pay | Admitting: Family Medicine

## 2014-07-03 MED ORDER — PROMETHAZINE HCL 25 MG/ML IJ SOLN
12.5000 mg | Freq: Four times a day (QID) | INTRAMUSCULAR | Status: DC | PRN
Start: 2014-07-03 — End: 2014-08-19

## 2014-07-03 NOTE — Telephone Encounter (Signed)
Do you know if this was done it is still in my que and will not let me close the encounter?

## 2014-07-03 NOTE — Telephone Encounter (Signed)
Rx faxed.    KP 

## 2014-07-03 NOTE — Telephone Encounter (Signed)
SHE IS HAVING ORAL DISCOMFORT  CAN YOU CALL HER SOMETHING IN TO [facility] [address]

## 2014-07-04 NOTE — Telephone Encounter (Signed)
The patent needs to be seen here or by her dentist.      KP

## 2014-07-05 ENCOUNTER — Encounter: Payer: Self-pay | Admitting: Medical

## 2014-07-05 ENCOUNTER — Ambulatory Visit (INDEPENDENT_AMBULATORY_CARE_PROVIDER_SITE_OTHER): Payer: Medicare Other | Admitting: Medical

## 2014-07-05 VITALS — BP 153/78 | HR 74 | Temp 97.6°F | Ht 65.0 in | Wt 166.4 lb

## 2014-07-05 DIAGNOSIS — B9789 Other viral agents as the cause of diseases classified elsewhere: Secondary | ICD-10-CM | POA: Insufficient documentation

## 2014-07-05 DIAGNOSIS — K121 Other forms of stomatitis: Secondary | ICD-10-CM | POA: Diagnosis not present

## 2014-07-05 MED ORDER — MAGIC MOUTHWASH: ORAL | Status: DC

## 2014-07-05 NOTE — Progress Notes (Signed)
Pre visit review using our clinic review tool, if applicable. No additional management support is needed unless otherwise documented below in the visit note. 

## 2014-07-05 NOTE — Assessment & Plan Note (Signed)
I think you have viral stomatitis but also considered burning mouth syndrome.  I will prescribe magic mouthwash and want you to follow up in 7 days for a recheck. Make sure area feels normal and exam is normal. If not better consider limited blood work and referral to ENT.

## 2014-07-05 NOTE — Patient Instructions (Signed)
I think you have viral stomatitis but also considered burning mouth syndrome.  I will prescribe magic mouthwash and want you to follow up in 7 days for a recheck. Make sure area feels normal and exam is normal. If not better consider limited blood work and referral to ENT.

## 2014-07-05 NOTE — Progress Notes (Signed)
Subjective:    Patient ID: Deanna Shields, female    DOB: [birth year], 81 y.o.   MRN: [id number]  HPI   Pt in states recent burning sensation to her gums and tongue started this week. Noticed at first brushing her teeth. It started on Monday. Pt states not worse than Monday but about the same. Pt also notes eating and drinking bothers her. Never had this before.(new)  No recent antibiotics.  Notes salty foods particularly cause pain.    Review of Systems  Constitutional: Negative for fever, chills and fatigue.  HENT: Negative for congestion, drooling, ear discharge, ear pain, facial swelling, nosebleeds, postnasal drip, sinus pressure, sneezing, sore throat and tinnitus.        Sore mouth.  Respiratory: Negative for cough, chest tightness, shortness of breath and wheezing.   Cardiovascular: Negative for chest pain and palpitations.  Gastrointestinal: Negative for abdominal pain, diarrhea, constipation and blood in stool.  Musculoskeletal: Negative for back pain.  Neurological: Negative for dizziness, seizures, weakness, light-headedness, numbness and headaches.  Hematological: Negative for adenopathy. Does not bruise/bleed easily.    Past Medical History  Diagnosis Date  . Hypertension   . Rhinitis, allergic   . Hemorrhoids 2004    Colonoscopy  . Hiatal hernia 2004    EGD  . Esophageal stricture 2009    EGD   . GERD (gastroesophageal reflux disease) 2004    EGD  . Arthritis   . Dizziness   . Syncopal episodes     History   Social History  . Marital Status: Married    Spouse Name: N/A    Number of Children: 0  . Years of Education: N/A   Occupational History  . retired     Geneticist, molecular   Social History Main Topics  . Smoking status: Never Smoker   . Smokeless tobacco: Never Used  . Alcohol Use: No  . Drug Use: No  . Sexual Activity: Not on file   Other Topics Concern  . Not on file   Social History Narrative   Patient doesn't get regular  exercise   married    Past Surgical History  Procedure Laterality Date  . Abdominal hysterectomy    . Hemorroidectomy    . Knee arthroscopy Bilateral     right x 2  . Carpal tunnel release Right     Family History  Problem Relation Age of Onset  . Stroke Father 88    light stroke  . Heart disease Sister   . Coronary artery disease Neg Hx   . Diabetes Neg Hx   . Cancer Neg Hx     breast, colon, prostate    Allergies  Allergen Reactions  . Codeine Nausea And Vomiting    in large doses: can tolerate Tussionex    Current Outpatient Prescriptions on File Prior to Visit  Medication Sig Dispense Refill  . amLODipine (NORVASC) 2.5 MG tablet Take 1 tablet (2.5 mg total) by mouth daily. 90 tablet 1  . aspirin 81 MG tablet Take 81 mg by mouth daily.     . chlorpheniramine-HYDROcodone (TUSSIONEX PENNKINETIC ER) 10-8 MG/5ML LQCR Take 5 mLs by mouth every 12 (twelve) hours as needed. 140 mL 0  . losartan (COZAAR) 100 MG tablet TAKE ONE-HALF (1/2) TABLET DAILY 45 tablet 0  . promethazine (PHENERGAN) 25 MG tablet Take 1 tablet (25 mg total) by mouth 4 (four) times daily as needed for nausea or vomiting. 30 tablet 0  . promethazine (PHENERGAN) 25  MG/ML injection Inject 0.5 mLs (12.5 mg total) into the muscle every 6 (six) hours as needed for nausea or vomiting. 2 mL 0   No current facility-administered medications on file prior to visit.    BP 153/78 mmHg  Pulse 74  Temp(Src) 97.6 F (36.4 C) (Oral)  Ht 5\' 5"  (1.651 m)  Wt 166 lb 6.4 oz (75.479 kg)  BMI 27.69 kg/m2  SpO2 97%      Objective:   Physical Exam   General- No acute distress. Pleasant patient. Neck- Full range of motion, no jvd Lungs- Clear, even and unlabored. Heart- regular rate and rhythm. Neurologic- CNII- XII grossly intact.  HEENT Head- Normal. Ear Auditory Canal - Left- Normal. Right - Normal.Tympanic Membrane- Left- Normal. Right- Normal. Eye Sclera/Conjunctiva- Left- Normal. Right- Normal. Nose &  Sinuses Nasal Mucosa- Left-  Not oggy or Congested. Right-  Not  boggy or Congested. Mouth & Throat Lips: Upper Lip- Normal: no dryness, cracking, pallor, cyanosis, or vesicular eruption. Lower Lip-Normal: no dryness, cracking, pallor, cyanosis or vesicular eruption. Buccal Mucosa- Bilateral- No Aphthous ulcers. Oropharynx- No Discharge or Erythema. Tonsils: Characteristics- Bilateral- No Erythema or Congestion. Size/Enlargement- Bilateral- No enlargement. Discharge- bilateral-None.  Buccal mucosa clear. Tongue normal no thrush. But under tongue at base 3-4 faint bright red area appearing mild apthous ulcer like. Tender to palpation directly.    [name]  Lymphatic Head & Neck General Head & Neck Lymphatics: Bilateral: Description- No Localized lymphadenopathy.          Assessment & Plan:

## 2014-07-11 DIAGNOSIS — H25813 Combined forms of age-related cataract, bilateral: Secondary | ICD-10-CM | POA: Diagnosis not present

## 2014-07-11 DIAGNOSIS — H3531 Nonexudative age-related macular degeneration: Secondary | ICD-10-CM | POA: Diagnosis not present

## 2014-07-11 DIAGNOSIS — H35373 Puckering of macula, bilateral: Secondary | ICD-10-CM | POA: Diagnosis not present

## 2014-07-12 ENCOUNTER — Encounter: Payer: Self-pay | Admitting: Medical

## 2014-07-12 ENCOUNTER — Ambulatory Visit (INDEPENDENT_AMBULATORY_CARE_PROVIDER_SITE_OTHER): Payer: Medicare Other | Admitting: Medical

## 2014-07-12 VITALS — BP 135/80 | HR 71 | Temp 97.8°F | Ht 65.0 in | Wt 166.2 lb

## 2014-07-12 DIAGNOSIS — K121 Other forms of stomatitis: Secondary | ICD-10-CM

## 2014-07-12 DIAGNOSIS — B9789 Other viral agents as the cause of diseases classified elsewhere: Secondary | ICD-10-CM

## 2014-07-12 DIAGNOSIS — J3089 Other allergic rhinitis: Secondary | ICD-10-CM | POA: Diagnosis not present

## 2014-07-12 DIAGNOSIS — J309 Allergic rhinitis, unspecified: Secondary | ICD-10-CM | POA: Insufficient documentation

## 2014-07-12 MED ORDER — CEPHALEXIN 500 MG PO CAPS: 500.0000 mg | ORAL_CAPSULE | Freq: Two times a day (BID) | ORAL | Status: DC

## 2014-07-12 MED ORDER — FLUTICASONE PROPIONATE 50 MCG/ACT NA SUSP: 2.0000 | Freq: Every day | NASAL | Status: DC

## 2014-07-12 NOTE — Progress Notes (Signed)
Pre visit review using our clinic review tool, if applicable. No additional management support is needed unless otherwise documented below in the visit note. 

## 2014-07-12 NOTE — Assessment & Plan Note (Signed)
Resolved no further treatment needed.

## 2014-07-12 NOTE — Progress Notes (Signed)
Subjective:    Patient ID: Deanna Shields, female    DOB: August 13, 1928, 81 y.o.   MRN: [id number]  HPI   Pt states she feels a lot better. Day after I saw her she reports improved. She is able to eat all food including salty foods with no pain.  Pt has some sneezing all week long week long. Pt tried some allegra otc. Feels little better. Faint left maxillary sinus tender and she thinks swollen. No fever, no chills, or sweats. But  hx of chronic cough in the past but not severe now. She has tussionex available.      Review of Systems  Constitutional: Negative for fever, chills and fatigue.  HENT: Positive for congestion and sinus pressure. Negative for ear pain, mouth sores, nosebleeds, postnasal drip and sore throat.        Apthous ulcer area no longer present.  Respiratory: Positive for cough. Negative for wheezing.        Rare cough.  Cardiovascular: Negative for chest pain and palpitations.  Musculoskeletal: Negative for neck pain.  Neurological: Negative for dizziness and headaches.  Hematological: Negative for adenopathy. Does not bruise/bleed easily.   Past Medical History  Diagnosis Date  . Hypertension   . Rhinitis, allergic   . Hemorrhoids 2004    Colonoscopy  . Hiatal hernia 2004    EGD  . Esophageal stricture 2009    EGD   . GERD (gastroesophageal reflux disease) 2004    EGD  . Arthritis   . Dizziness   . Syncopal episodes     History   Social History  . Marital Status: Married    Spouse Name: N/A    Number of Children: 0  . Years of Education: N/A   Occupational History  . retired     Geneticist, molecular   Social History Main Topics  . Smoking status: Never Smoker   . Smokeless tobacco: Never Used  . Alcohol Use: No  . Drug Use: No  . Sexual Activity: Not on file   Other Topics Concern  . Not on file   Social History Narrative   Patient doesn't get regular exercise   married    Past Surgical History  Procedure Laterality Date  .  Abdominal hysterectomy    . Hemorroidectomy    . Knee arthroscopy Bilateral     right x 2  . Carpal tunnel release Right     Family History  Problem Relation Age of Onset  . Stroke Father 99    light stroke  . Heart disease Sister   . Coronary artery disease Neg Hx   . Diabetes Neg Hx   . Cancer Neg Hx     breast, colon, prostate    Allergies  Allergen Reactions  . Codeine Nausea And Vomiting    in large doses: can tolerate Tussionex    Current Outpatient Prescriptions on File Prior to Visit  Medication Sig Dispense Refill  . Alum & Mag Hydroxide-Simeth (MAGIC MOUTHWASH) SOLN 5 ml po qid swish and spit 200 mL 0  . amLODipine (NORVASC) 2.5 MG tablet Take 1 tablet (2.5 mg total) by mouth daily. 90 tablet 1  . aspirin 81 MG tablet Take 81 mg by mouth daily.     . chlorpheniramine-HYDROcodone (TUSSIONEX PENNKINETIC ER) 10-8 MG/5ML LQCR Take 5 mLs by mouth every 12 (twelve) hours as needed. 140 mL 0  . losartan (COZAAR) 100 MG tablet TAKE ONE-HALF (1/2) TABLET DAILY 45 tablet 0  .  promethazine (PHENERGAN) 25 MG tablet Take 1 tablet (25 mg total) by mouth 4 (four) times daily as needed for nausea or vomiting. 30 tablet 0  . promethazine (PHENERGAN) 25 MG/ML injection Inject 0.5 mLs (12.5 mg total) into the muscle every 6 (six) hours as needed for nausea or vomiting. (Patient not taking: Reported on 07/12/2014) 2 mL 0   No current facility-administered medications on file prior to visit.    BP 135/80 mmHg  Pulse 71  Temp(Src) 97.8 F (36.6 C) (Oral)  Ht 5\' 5"  (1.651 m)  Wt 166 lb 3.2 oz (75.388 kg)  BMI 27.66 kg/m2  SpO2 97%      Objective:   Physical Exam  General  Mental Status - Alert. General Appearance - Well groomed. Not in acute distress.  Skin Rashes- No Rashes.  HEENT Head- Normal. Ear Auditory Canal - Left- Normal. Right - Normal.Tympanic Membrane- Left- Normal. Right- Normal. Eye Sclera/Conjunctiva- Left- Normal. Right- Normal. Nose & Sinuses Nasal  Mucosa- Left-  Boggy and Congested. Right-  Boggy and  Congested.Faint lt side maxillary and frontal sinus pressure. Mouth & Throat Lips: Upper Lip- Normal: no dryness, cracking, pallor, cyanosis, or vesicular eruption. Lower Lip-Normal: no dryness, cracking, pallor, cyanosis or vesicular eruption. Buccal Mucosa- Bilateral- No Aphthous ulcers. Oropharynx- No Discharge or Erythema. Prior apthous ulcer is not present. No pain on palpation in this area. Tonsils: Characteristics- Bilateral- No Erythema or Congestion. Size/Enlargement- Bilateral- No enlargement. Discharge- bilateral-None.  Neck Neck- Supple. No Masses.   Chest and Lung Exam Auscultation: Breath Sounds:-Clear even and unlabored.  Cardiovascular Auscultation:Rythm- Regular, rate and rhythm. Murmurs & Other Heart Sounds:Ausculatation of the heart reveal- No Murmurs.  Lymphatic Head & Neck General Head & Neck Lymphatics: Bilateral: Description- No Localized lymphadenopathy.       Assessment & Plan:

## 2014-07-12 NOTE — Assessment & Plan Note (Signed)
Vs uri type symptoms. Continue with allegra and rx of flonase. If your sinus pressure worsens then would start cephalexin. I am making that available to use over weekend or early next week if needed

## 2014-07-12 NOTE — Patient Instructions (Addendum)
Viral stomatitis Resolved no further treatment needed.   Allergic rhinitis Vs uri type symptoms. Continue with allegra and rx of flonase. If your sinus pressure worsens then would start cephalexin. I am making that available to use over weekend or early next week if needed    Follow up in 7 days or as neeeded

## 2014-08-13 ENCOUNTER — Encounter: Payer: Self-pay | Admitting: Cardiovascular Disease

## 2014-08-13 ENCOUNTER — Ambulatory Visit (INDEPENDENT_AMBULATORY_CARE_PROVIDER_SITE_OTHER): Payer: Medicare Other | Admitting: Cardiovascular Disease

## 2014-08-13 VITALS — BP 142/80 | HR 74 | Ht 64.5 in | Wt 170.0 lb

## 2014-08-13 DIAGNOSIS — R079 Chest pain, unspecified: Secondary | ICD-10-CM

## 2014-08-13 MED ORDER — AMLODIPINE BESYLATE 5 MG PO TABS: 5.0000 mg | ORAL_TABLET | Freq: Every day | ORAL | Status: DC

## 2014-08-13 MED ORDER — ISOSORBIDE MONONITRATE ER 30 MG PO TB24: 30.0000 mg | ORAL_TABLET | Freq: Every day | ORAL | Status: DC

## 2014-08-13 NOTE — Assessment & Plan Note (Signed)
Deanna Shields has complained of jaw shoulder arm chest and back pain along with dyspnea over the last several months has become progressively worse over time. I'm going to increase her amlodipine from 2.5-5 mg a day, add low-dose Imdur and obtain a 2-D echo and from classic Myoview stress test to rule out an ischemic etiology.

## 2014-08-13 NOTE — Assessment & Plan Note (Signed)
History of hypertension blood pressure measured at 142/80. She is on low-dose amlodipine and losartan. Continue current meds at current dose

## 2014-08-13 NOTE — Progress Notes (Signed)
08/13/2014 [place]   [birth year]  [id number]  Primary Physician Deanna Koyanagi, DO Primary Cardiologist: Deanna Harp MD Deanna Shields   HPI:  The patient is a delightful 81 year old, mildly overweight, married Caucasian female with no children whose husband Deanna Shields is also a long-term patient of mine. Unfortunately, he has mesothelioma treated by Dr. Julien Shields secondary to remote asbestos exposure. I last saw her in the office 12/25/13.[name]   She was remotely a patient of Dr. Alla Shields. She had a cath done by him Oct 23, 1999, after an abnormal Myoview done because of chest pain that showed minimal CAD.[name] Her risk factors at that time were family history and hyperlipidemia. She does not smoke or drink alcohol. She has been on statin drugs in the past.  Deanna Shields was admitted to the hospital 10/28/13 for 2 days because of chest pain/rule out myocardial function. A 2-D echo and Myoview stress test were normal. She was seen back a week later because of witnessed syncope. Over the last 3-4 months she's noticed jaw. Associated with left shoulder arm back and chest pain with increasing dyspnea on exertion.  Current Outpatient Prescriptions  Medication Sig Dispense Refill  . amLODipine (NORVASC) 2.5 MG tablet Take 1 tablet (2.5 mg total) by mouth daily. 90 tablet 1  . aspirin 81 MG tablet Take 81 mg by mouth daily.     . chlorpheniramine-HYDROcodone (TUSSIONEX PENNKINETIC ER) 10-8 MG/5ML LQCR Take 5 mLs by mouth every 12 (twelve) hours as needed. 140 mL 0  . fluticasone (FLONASE) 50 MCG/ACT nasal spray Place 2 sprays into both nostrils daily. 16 g 1  . losartan (COZAAR) 100 MG tablet TAKE ONE-HALF (1/2) TABLET DAILY 45 tablet 0  . promethazine (PHENERGAN) 25 MG tablet Take 1 tablet (25 mg total) by mouth 4 (four) times daily as needed for nausea or vomiting. 30 tablet 0  . promethazine (PHENERGAN) 25 MG/ML injection Inject 0.5 mLs (12.5 mg total) into the muscle every 6 (six) hours as  needed for nausea or vomiting. 2 mL 0   No current facility-administered medications for this visit.    Allergies  Allergen Reactions  . Codeine Nausea And Vomiting    in large doses: can tolerate Tussionex    History   Social History  . Marital Status: Married    Spouse Name: N/A  . Number of Children: 0  . Years of Education: N/A   Occupational History  . retired     Geneticist, molecular   Social History Main Topics  . Smoking status: Never Smoker   . Smokeless tobacco: Never Used  . Alcohol Use: No  . Drug Use: No  . Sexual Activity: Not on file   Other Topics Concern  . Not on file   Social History Narrative   Patient doesn't get regular exercise   married     Review of Systems: General: negative for chills, fever, night sweats or weight changes.  Cardiovascular: negative for chest pain, dyspnea on exertion, edema, orthopnea, palpitations, paroxysmal nocturnal dyspnea or shortness of breath Dermatological: negative for rash Respiratory: negative for cough or wheezing Urologic: negative for hematuria Abdominal: negative for nausea, vomiting, diarrhea, bright red blood per rectum, melena, or hematemesis Neurologic: negative for visual changes, syncope, or dizziness All other systems reviewed and are otherwise negative except as noted above.    Blood pressure 142/80, pulse 74, height 5' 4.5" (1.638 m), weight 170 lb (77.111 kg).  General appearance: alert and no distress Neck:  no adenopathy, no carotid bruit, no JVD, supple, symmetrical, trachea midline and thyroid not enlarged, symmetric, no tenderness/mass/nodules Lungs: clear to auscultation bilaterally Heart: regular rate and rhythm, S1, S2 normal, no murmur, click, rub or gallop Extremities: extremities normal, atraumatic, no cyanosis or edema  EKG normal sinus rhythm at 74 with nonspecific ST and T-wave changes. I personally reviewed this EKG  ASSESSMENT AND PLAN:   Essential hypertension History  of hypertension blood pressure measured at 142/80. She is on low-dose amlodipine and losartan. Continue current meds at current dose   Chest pain Miss Deanna Shields has complained of jaw shoulder arm chest and back pain along with dyspnea over the last several months has become progressively worse over time. I'm going to increase her amlodipine from 2.5-5 mg a day, add low-dose Imdur and obtain a 2-D echo and from classic Myoview stress test to rule out an ischemic etiology.       Deanna Harp MD FACP,FACC,FAHA, [facility] 08/13/2014 4:20 PM

## 2014-08-13 NOTE — Patient Instructions (Signed)
  We will see you back in follow up after the tests.  Dr [name] has ordered: 1. Increase Amlodipine to 5mg  daily (two tablets of the 2.5mg ) I sent in the new prescription for the 5mg  tablet.  2. Start Imdur 30mg , take 1/2 tablet daily.  This may give you a headache when you first start taking this medication.   3.  Echocardiogram. Echocardiography is a painless test that uses sound waves to create images of your heart. It provides your doctor with information about the size and shape of your heart and how well your heart's chambers and valves are working. This procedure takes approximately one hour. There are no restrictions for this procedure.   4. Lexiscan Myoview- this is a test that looks at the blood flow to your heart muscle.  It takes approximately 2 1/2 hours. Please follow instruction sheet, as given.

## 2014-08-16 ENCOUNTER — Encounter (HOSPITAL_COMMUNITY): Payer: Self-pay | Admitting: *Deleted

## 2014-08-19 ENCOUNTER — Encounter: Payer: Self-pay | Admitting: Family Medicine

## 2014-08-19 ENCOUNTER — Ambulatory Visit (INDEPENDENT_AMBULATORY_CARE_PROVIDER_SITE_OTHER): Payer: Medicare Other | Admitting: Family Medicine

## 2014-08-19 VITALS — BP 140/78 | HR 65 | Temp 97.7°F | Wt 169.0 lb

## 2014-08-19 DIAGNOSIS — E785 Hyperlipidemia, unspecified: Secondary | ICD-10-CM | POA: Diagnosis not present

## 2014-08-19 DIAGNOSIS — I10 Essential (primary) hypertension: Secondary | ICD-10-CM

## 2014-08-19 DIAGNOSIS — R059 Cough, unspecified: Secondary | ICD-10-CM

## 2014-08-19 DIAGNOSIS — R05 Cough: Secondary | ICD-10-CM | POA: Diagnosis not present

## 2014-08-19 DIAGNOSIS — I209 Angina pectoris, unspecified: Secondary | ICD-10-CM

## 2014-08-19 LAB — LIPID PANEL
Cholesterol: 178 mg/dL (ref 0–200)
HDL: 52.6 mg/dL (ref >39.00)
LDL Cholesterol: 102 mg/dL — ABNORMAL HIGH (ref 0–99)
NonHDL: 125.4
TRIGLYCERIDES: 117 mg/dL (ref 0.0–149.0)
Total CHOL/HDL Ratio: 3
VLDL: 23.4 mg/dL (ref 0.0–40.0)

## 2014-08-19 LAB — HEPATIC FUNCTION PANEL
ALBUMIN: 4.3 g/dL (ref 3.5–5.2)
ALT: 14 U/L (ref 0–35)
AST: 22 U/L (ref 0–37)
Alkaline Phosphatase: 71 U/L (ref 39–117)
BILIRUBIN TOTAL: 0.6 mg/dL (ref 0.2–1.2)
Bilirubin, Direct: 0.1 mg/dL (ref 0.0–0.3)
Total Protein: 6.7 g/dL (ref 6.0–8.3)

## 2014-08-19 LAB — BASIC METABOLIC PANEL
BUN: 17 mg/dL (ref 6–23)
CALCIUM: 9.8 mg/dL (ref 8.4–10.5)
CO2: 32 mEq/L (ref 19–32)
Chloride: 101 mEq/L (ref 96–112)
Creatinine, Ser: 0.96 mg/dL (ref 0.40–1.20)
GFR: 58.86 mL/min — ABNORMAL LOW (ref >60.00)
GLUCOSE: 93 mg/dL (ref 70–99)
POTASSIUM: 4.7 meq/L (ref 3.5–5.1)
SODIUM: 138 meq/L (ref 135–145)

## 2014-08-19 MED ORDER — ISOSORBIDE MONONITRATE ER 30 MG PO TB24: 30.0000 mg | ORAL_TABLET | Freq: Every day | ORAL | Status: DC

## 2014-08-19 MED ORDER — HYDROCOD POLST-CHLORPHEN POLST 10-8 MG/5ML PO LQCR: 5.0000 mL | Freq: Two times a day (BID) | ORAL | Status: DC | PRN

## 2014-08-19 NOTE — Patient Instructions (Signed)

## 2014-08-19 NOTE — Progress Notes (Signed)
Pre visit review using our clinic review tool, if applicable. No additional management support is needed unless otherwise documented below in the visit note. 

## 2014-08-19 NOTE — Progress Notes (Signed)
Patient ID: Deanna Shields, female    DOB: May 30, 1929  Age: 81 y.o. MRN: [id number]    Subjective:  Subjective HPI Deanna Shields presents for f/u htn and med refillls  Review of Systems  Constitutional: Negative for activity change, appetite change, fatigue and unexpected weight change.  Respiratory: Negative for cough and shortness of breath.   Cardiovascular: Negative for chest pain and palpitations.  Endocrine: Negative for heat intolerance.  Psychiatric/Behavioral: Negative for behavioral problems and dysphoric mood. The patient is not nervous/anxious.     History Past Medical History  Diagnosis Date  . Hypertension   . Rhinitis, allergic   . Hemorrhoids 2004    Colonoscopy  . Hiatal hernia 2004    EGD  . Esophageal stricture 2009    EGD   . GERD (gastroesophageal reflux disease) 2004    EGD  . Arthritis   . Dizziness   . Syncopal episodes     She has past surgical history that includes Abdominal hysterectomy; Hemorroidectomy; Knee arthroscopy (Bilateral); Carpal tunnel release (Right); [facility] (06/01/2012); and Cardiac catheterization (10/23/1999).   Her family history includes Heart disease in her sister; Stroke (age of onset: 71) in her father. There is no history of Coronary artery disease, Diabetes, or Cancer.She reports that she has never smoked. She has never used smokeless tobacco. She reports that she does not drink alcohol or use illicit drugs.  Current Outpatient Prescriptions on File Prior to Visit  Medication Sig Dispense Refill  . amLODipine (NORVASC) 5 MG tablet Take 1 tablet (5 mg total) by mouth daily. 30 tablet 6  . aspirin 81 MG tablet Take 81 mg by mouth daily.     . fluticasone (FLONASE) 50 MCG/ACT nasal spray Place 2 sprays into both nostrils daily. 16 g 1  . losartan (COZAAR) 100 MG tablet TAKE ONE-HALF (1/2) TABLET DAILY 45 tablet 0  . promethazine (PHENERGAN) 25 MG tablet Take 1 tablet (25 mg total) by mouth 4 (four) times daily as  needed for nausea or vomiting. 30 tablet 0   No current facility-administered medications on file prior to visit.     Objective:  Objective Physical Exam  Constitutional: She is oriented to person, place, and time. She appears well-developed and well-nourished. No distress.  HENT:  Right Ear: External ear normal.  Left Ear: External ear normal.  Nose: Nose normal.  Mouth/Throat: Oropharynx is clear and moist.  Eyes: EOM are normal. Pupils are equal, round, and reactive to light.  Neck: Normal range of motion. Neck supple.  Cardiovascular: Normal rate, regular rhythm and normal heart sounds.   No murmur heard. Pulmonary/Chest: Effort normal and breath sounds normal. No respiratory distress. She has no wheezes. She has no rales. She exhibits no tenderness.  Neurological: She is alert and oriented to person, place, and time.  Psychiatric: She has a normal mood and affect. Her behavior is normal. Judgment and thought content normal.   BP 140/78 mmHg  Pulse 65  Temp(Src) 97.7 F (36.5 C) (Oral)  Wt 169 lb (76.658 kg)  SpO2 98% Wt Readings from Last 3 Encounters:  08/19/14 169 lb (76.658 kg)  08/13/14 170 lb (77.111 kg)  07/12/14 166 lb 3.2 oz (75.388 kg)     Lab Results  Component Value Date   WBC 8.4 11/10/2013   HGB 12.1 11/10/2013   HCT 34.4* 11/10/2013   PLT 223 11/10/2013   GLUCOSE 93 08/19/2014   CHOL 178 08/19/2014   TRIG 117.0 08/19/2014   HDL  52.60 08/19/2014   LDLDIRECT 161.2 07/18/2013   LDLCALC 102* 08/19/2014   ALT 14 08/19/2014   AST 22 08/19/2014   NA 138 08/19/2014   K 4.7 08/19/2014   CL 101 08/19/2014   CREATININE 0.96 08/19/2014   BUN 17 08/19/2014   CO2 32 08/19/2014   TSH 2.780 10/28/2013    Dg Bone Density  03/03/2014   Osteopenia - with T scores at spine and femoral necks all of -1.7.  Spine  score may be falsely elevated due to scoliosis.    Assessment & Plan:  Plan I have discontinued Deanna Shields's isosorbide mononitrate. I am also  having her start on isosorbide mononitrate. Additionally, I am having her maintain her aspirin, promethazine, losartan, fluticasone, amLODipine, and chlorpheniramine-HYDROcodone.  Meds ordered this encounter  Medications  . chlorpheniramine-HYDROcodone (TUSSIONEX PENNKINETIC ER) 10-8 MG/5ML LQCR    Sig: Take 5 mLs by mouth every 12 (twelve) hours as needed.    Dispense:  140 mL    Refill:  0  . isosorbide mononitrate (IMDUR) 30 MG 24 hr tablet    Sig: Take 1 tablet (30 mg total) by mouth daily.    Dispense:  30 tablet    Refill:  6    Problem List Items Addressed This Visit      Unprioritized   COUGH, CHRONIC - Primary   Relevant Medications   chlorpheniramine-HYDROcodone (TUSSIONEX PENNKINETIC ER) 10-8 MG/5ML LQCR   Essential hypertension   Relevant Medications   isosorbide mononitrate (IMDUR) 24 hr tablet   Other Relevant Orders   Basic metabolic panel (Completed)   Hepatic function panel (Completed)   Lipid panel (Completed)   Chest pain   Relevant Medications   isosorbide mononitrate (IMDUR) 24 hr tablet    Other Visit Diagnoses    Hyperlipidemia        Relevant Medications    isosorbide mononitrate (IMDUR) 24 hr tablet    Other Relevant Orders    Basic metabolic panel (Completed)    Hepatic function panel (Completed)    Lipid panel (Completed)       Follow-up: Return in about 6 months (around 02/19/2015), or if symptoms worsen or fail to improve, for cpe.  Garnet Koyanagi, DO

## 2014-08-23 ENCOUNTER — Telehealth (HOSPITAL_COMMUNITY): Payer: Self-pay

## 2014-08-23 NOTE — Telephone Encounter (Signed)
Encounter complete. 

## 2014-08-28 ENCOUNTER — Ambulatory Visit (HOSPITAL_COMMUNITY)
Admission: RE | Admit: 2014-08-28 | Discharge: 2014-08-28 | Disposition: A | Discharge status: Home / Self Care | Payer: Medicare Other | Source: Ambulatory Visit | Attending: Cardiovascular Disease | Admitting: Cardiovascular Disease

## 2014-08-28 ENCOUNTER — Ambulatory Visit (HOSPITAL_BASED_OUTPATIENT_CLINIC_OR_DEPARTMENT_OTHER)
Admission: RE | Admit: 2014-08-28 | Discharge: 2014-08-28 | Disposition: A | Discharge status: Home / Self Care | Payer: Medicare Other | Source: Ambulatory Visit | Attending: Cardiovascular Disease | Admitting: Cardiovascular Disease

## 2014-08-28 DIAGNOSIS — R0602 Shortness of breath: Secondary | ICD-10-CM | POA: Insufficient documentation

## 2014-08-28 DIAGNOSIS — R079 Chest pain, unspecified: Secondary | ICD-10-CM | POA: Insufficient documentation

## 2014-08-28 DIAGNOSIS — R5383 Other fatigue: Secondary | ICD-10-CM | POA: Insufficient documentation

## 2014-08-28 DIAGNOSIS — R42 Dizziness and giddiness: Secondary | ICD-10-CM | POA: Insufficient documentation

## 2014-08-28 DIAGNOSIS — R0609 Other forms of dyspnea: Secondary | ICD-10-CM | POA: Insufficient documentation

## 2014-08-28 DIAGNOSIS — I071 Rheumatic tricuspid insufficiency: Secondary | ICD-10-CM | POA: Insufficient documentation

## 2014-08-28 DIAGNOSIS — I739 Peripheral vascular disease, unspecified: Secondary | ICD-10-CM | POA: Insufficient documentation

## 2014-08-28 DIAGNOSIS — E663 Overweight: Secondary | ICD-10-CM | POA: Insufficient documentation

## 2014-08-28 DIAGNOSIS — Z6828 Body mass index (BMI) 28.0-28.9, adult: Secondary | ICD-10-CM | POA: Diagnosis not present

## 2014-08-28 DIAGNOSIS — I10 Essential (primary) hypertension: Secondary | ICD-10-CM | POA: Insufficient documentation

## 2014-08-28 DIAGNOSIS — Z87891 Personal history of nicotine dependence: Secondary | ICD-10-CM | POA: Insufficient documentation

## 2014-08-28 MED ORDER — TECHNETIUM TC 99M SESTAMIBI GENERIC - CARDIOLITE
10.9000 | Freq: Once | INTRAVENOUS | Status: AC | PRN
  Administered 2014-08-28: 10.9 via INTRAVENOUS

## 2014-08-28 MED ORDER — REGADENOSON 0.4 MG/5ML IV SOLN
0.4000 mg | Freq: Once | INTRAVENOUS | Status: AC
  Administered 2014-08-28: 0.4 mg via INTRAVENOUS

## 2014-08-28 MED ORDER — TECHNETIUM TC 99M SESTAMIBI GENERIC - CARDIOLITE
31.9000 | Freq: Once | INTRAVENOUS | Status: AC | PRN
  Administered 2014-08-28: 31.9 via INTRAVENOUS

## 2014-08-28 NOTE — Procedures (Addendum)
[place] [facility] [address] [place] [address] [id number]  Cardiology Nuclear Med Study  [place] is a 81 y.o. female     MRN : [id number]     DOB: 1929-04-21  Procedure Date: 08/28/2014  Nuclear Med Background Indication for Stress Test:  Evaluation for Ischemia History:  CATH (no stents);Last NUC MPI on 06/01/2012-normal;EF=71%; Cardiac Risk Factors: History of Smoking, Hypertension, Overweight and bilateral claudication;chronic cough  Symptoms:  Chest Pain, Dizziness, DOE, Fatigue and SOB   Nuclear Pre-Procedure Caffeine/Decaff Intake:  7:00pm NPO After: 3:00am   IV Site: R Forearm  IV 0.9% NS with Angio Cath:  22g  Chest Size (in):  n/a IV Started by: Rolene Course, RN  Height: 5\' 5"  (1.651 m)  Cup Size: C  BMI:  Body mass index is 28.29 kg/(m^2). Weight:  170 lb (77.111 kg)   Tech Comments:  n/a    Nuclear Med Study 1 or 2 day study: 1 day  Stress Test Type:  [place]  Order Authorizing Provider:  Quay Burow, MD   Resting Radionuclide: Technetium 13m Sestamibi  Resting Radionuclide Dose: 10.9 mCi   Stress Radionuclide:  Technetium 85m Sestamibi  Stress Radionuclide Dose: 31.9 mCi           Stress Protocol Rest HR: 65 Stress HR: 88  Rest BP: 137/87 Stress BP: 149/74  Exercise Time (min): n/a METS: n/a          Dose of Adenosine (mg):  n/a Dose of Lexiscan: 0.4 mg  Dose of Atropine (mg): n/a Dose of Dobutamine: n/a mcg/kg/min (at max HR)  Stress Test Technologist: Leane Para, CCT Nuclear Technologist: Imagene Riches, CNMT   Rest Procedure:  Myocardial perfusion imaging was performed at rest 45 minutes following the intravenous administration of Technetium 34m Sestamibi. Stress Procedure:  The patient received IV Lexiscan 0.4 mg over 15-seconds.  Technetium 32m Sestamibi injected IV at 30-seconds.  There were no significant changes with Lexiscan.  Quantitative spect images were obtained  after a 45 minute delay.  Transient Ischemic Dilatation (Normal <1.22):  1.20  QGS EDV:  66 ml QGS ESV:  18 ml LV Ejection Fraction: 73%     Rest ECG: NSR - Normal EKG  Stress ECG: No significant change from baseline ECG  QPS Raw Data Images:  Normal; no motion artifact; normal heart/lung ratio. Stress Images:  There is decreased uptake in the inferior wall. Rest Images:  Normal homogeneous uptake in all areas of the myocardium. Subtraction (SDS):  These findings are consistent with ischemia.  Impression Exercise Capacity:  Lexiscan with no exercise. BP Response:  Normal blood pressure response. Clinical Symptoms:  No significant symptoms noted. ECG Impression:  No significant ST segment change suggestive of ischemia. Comparison with Prior Nuclear Study: New findings of inferior ischemia since prior study  Overall Impression:  Intermediate risk stress nuclear study Moderate infero apical ischemia. Follow up with Dr. [name].  LV Wall Motion:  NL LV Function; NL Wall Motion   [name], MD  08/28/2014 5:37 PM

## 2014-08-28 NOTE — Progress Notes (Signed)
2D Echo Performed 08/28/2014    Marygrace Drought, RCS

## 2014-08-29 ENCOUNTER — Telehealth: Payer: Self-pay | Admitting: Family Medicine

## 2014-08-29 NOTE — Telephone Encounter (Signed)
Notes Recorded by Ewing Schlein, CMA on 08/26/2014 at 5:46 PM Copy mailed to the address on file   KP Notes Recorded by Rosalita Chessman, DO on 08/24/2014 at 3:45 PM Overall good. Recheck--- 1 year    Patient aware and verbalized understanding.      KP

## 2014-08-29 NOTE — Telephone Encounter (Signed)
Caller name: [place], [place] D Relation to pt: self  Call back number:[phone] Pharmacy:  Reason for call:  Pt inquiring about lab results taken 08/19/14

## 2014-08-30 ENCOUNTER — Encounter: Payer: Self-pay | Admitting: *Deleted

## 2014-09-05 ENCOUNTER — Encounter: Payer: Self-pay | Admitting: Cardiovascular Disease

## 2014-09-05 ENCOUNTER — Ambulatory Visit (INDEPENDENT_AMBULATORY_CARE_PROVIDER_SITE_OTHER): Payer: Medicare Other | Admitting: Cardiovascular Disease

## 2014-09-05 VITALS — BP 158/76 | HR 78 | Ht 64.0 in | Wt 165.0 lb

## 2014-09-05 DIAGNOSIS — Z79899 Other long term (current) drug therapy: Secondary | ICD-10-CM | POA: Diagnosis not present

## 2014-09-05 DIAGNOSIS — R079 Chest pain, unspecified: Secondary | ICD-10-CM | POA: Diagnosis not present

## 2014-09-05 DIAGNOSIS — D689 Coagulation defect, unspecified: Secondary | ICD-10-CM | POA: Diagnosis not present

## 2014-09-05 DIAGNOSIS — R9439 Abnormal result of other cardiovascular function study: Secondary | ICD-10-CM | POA: Diagnosis not present

## 2014-09-05 NOTE — Assessment & Plan Note (Signed)
Deanna Shields returns today for follow-up of her 2-D echo Myoview stress test performed because of chest pain. Her echo is normal however her Myoview showed inferolateral ischemia. Based on this, I'm going to arrange for her to undergo outpatient right radial coronary angiography to define her anatomy. We discussed the risks and benefits thoroughly and she agrees to proceed

## 2014-09-05 NOTE — Patient Instructions (Signed)
Your physician has requested that you have a cardiac catheterization. Cardiac catheterization is used to diagnose and/or treat various heart conditions. Doctors may recommend this procedure for a number of different reasons. The most common reason is to evaluate chest pain. Chest pain can be a symptom of coronary artery disease (CAD), and cardiac catheterization can show whether plaque is narrowing or blocking your heart's arteries. This procedure is also used to evaluate the valves, as well as measure the blood flow and oxygen levels in different parts of your heart. For further information please visit [email or web address]. Please follow instruction sheet, as given.  Your physician recommends that you return for lab work   A chest x-ray takes a picture of the organs and structures inside the chest, including the heart, lungs, and blood vessels. This test can show several things, including, whether the heart is enlarges; whether fluid is building up in the lungs; and whether pacemaker / defibrillator leads are still in place.

## 2014-09-05 NOTE — Progress Notes (Signed)
Deanna Shields returns for follow-up of her outpatient diagnostic tests performed in evaluation of progressive back and chest pain. Her echo was normal however her Myoview was in immediate risk and remarkable for inferolateral ischemia. Based on this we decided to proceed with outpatient diagnostic coronary angiography the right radial approach to define her anatomy. Of note. I did increase her amlodipine and begin him door which caused dizziness. Imdur has since been discontinued.   [name], M.D., [place], [facility], [place] [address] [place], [place]  [address]  [phone] 09/05/2014 3:03 PM

## 2014-09-09 ENCOUNTER — Other Ambulatory Visit: Payer: Self-pay

## 2014-09-09 ENCOUNTER — Ambulatory Visit
Admission: RE | Admit: 2014-09-09 | Discharge: 2014-09-09 | Disposition: A | Discharge status: Home / Self Care | Payer: Medicare Other | Source: Ambulatory Visit | Attending: Cardiovascular Disease | Admitting: Cardiovascular Disease

## 2014-09-09 DIAGNOSIS — D689 Coagulation defect, unspecified: Secondary | ICD-10-CM | POA: Diagnosis not present

## 2014-09-09 DIAGNOSIS — R0602 Shortness of breath: Secondary | ICD-10-CM | POA: Diagnosis not present

## 2014-09-09 DIAGNOSIS — R079 Chest pain, unspecified: Secondary | ICD-10-CM

## 2014-09-09 DIAGNOSIS — Z79899 Other long term (current) drug therapy: Secondary | ICD-10-CM | POA: Diagnosis not present

## 2014-09-09 LAB — PROTIME-INR
INR: 0.99 (ref <1.50)
PROTHROMBIN TIME: 13.1 s (ref 11.6–15.2)

## 2014-09-09 LAB — APTT: APTT: 33 s (ref 24–37)

## 2014-09-10 LAB — CBC
HCT: 43.3 % (ref 36.0–46.0)
HEMOGLOBIN: 14.2 g/dL (ref 12.0–15.0)
MCH: 32.1 pg (ref 26.0–34.0)
MCHC: 32.8 g/dL (ref 30.0–36.0)
MCV: 98 fL (ref 78.0–100.0)
MPV: 10 fL (ref 8.6–12.4)
Platelets: 277 10*3/uL (ref 150–400)
RBC: 4.42 MIL/uL (ref 3.87–5.11)
RDW: 13.5 % (ref 11.5–15.5)
WBC: 8.4 10*3/uL (ref 4.0–10.5)

## 2014-09-10 LAB — COMPREHENSIVE METABOLIC PANEL
ALT: 13 U/L (ref 0–35)
AST: 20 U/L (ref 0–37)
Albumin: 4.4 g/dL (ref 3.5–5.2)
Alkaline Phosphatase: 80 U/L (ref 39–117)
BILIRUBIN TOTAL: 0.8 mg/dL (ref 0.2–1.2)
BUN: 21 mg/dL (ref 6–23)
CO2: 30 mEq/L (ref 19–32)
Calcium: 10 mg/dL (ref 8.4–10.5)
Chloride: 98 mEq/L (ref 96–112)
Creat: 1.04 mg/dL (ref 0.50–1.10)
Glucose, Bld: 93 mg/dL (ref 70–99)
POTASSIUM: 5.2 meq/L (ref 3.5–5.3)
Sodium: 138 mEq/L (ref 135–145)
Total Protein: 6.7 g/dL (ref 6.0–8.3)

## 2014-09-12 ENCOUNTER — Encounter: Payer: Self-pay | Admitting: *Deleted

## 2014-09-16 ENCOUNTER — Encounter (HOSPITAL_COMMUNITY): Payer: Self-pay | Admitting: Cardiovascular Disease

## 2014-09-16 ENCOUNTER — Encounter (HOSPITAL_COMMUNITY)
Admission: RE | Disposition: A | Discharge status: Home / Self Care | Payer: Self-pay | Source: Ambulatory Visit | Attending: Cardiovascular Disease

## 2014-09-16 ENCOUNTER — Ambulatory Visit (HOSPITAL_COMMUNITY)
Admission: RE | Admit: 2014-09-16 | Discharge: 2014-09-16 | Disposition: A | Discharge status: Home / Self Care | Payer: Medicare Other | Source: Ambulatory Visit | Attending: Cardiovascular Disease | Admitting: Cardiovascular Disease

## 2014-09-16 DIAGNOSIS — E663 Overweight: Secondary | ICD-10-CM | POA: Diagnosis not present

## 2014-09-16 DIAGNOSIS — R931 Abnormal findings on diagnostic imaging of heart and coronary circulation: Secondary | ICD-10-CM | POA: Diagnosis not present

## 2014-09-16 DIAGNOSIS — I2510 Atherosclerotic heart disease of native coronary artery without angina pectoris: Secondary | ICD-10-CM | POA: Diagnosis not present

## 2014-09-16 DIAGNOSIS — Z886 Allergy status to analgesic agent status: Secondary | ICD-10-CM | POA: Insufficient documentation

## 2014-09-16 DIAGNOSIS — R0600 Dyspnea, unspecified: Secondary | ICD-10-CM | POA: Diagnosis present

## 2014-09-16 DIAGNOSIS — I10 Essential (primary) hypertension: Secondary | ICD-10-CM | POA: Diagnosis not present

## 2014-09-16 DIAGNOSIS — R079 Chest pain, unspecified: Secondary | ICD-10-CM | POA: Diagnosis present

## 2014-09-16 DIAGNOSIS — E785 Hyperlipidemia, unspecified: Secondary | ICD-10-CM | POA: Diagnosis not present

## 2014-09-16 DIAGNOSIS — Z6827 Body mass index (BMI) 27.0-27.9, adult: Secondary | ICD-10-CM | POA: Diagnosis not present

## 2014-09-16 DIAGNOSIS — Z77090 Contact with and (suspected) exposure to asbestos: Secondary | ICD-10-CM | POA: Insufficient documentation

## 2014-09-16 DIAGNOSIS — Z7982 Long term (current) use of aspirin: Secondary | ICD-10-CM | POA: Diagnosis not present

## 2014-09-16 DIAGNOSIS — R9439 Abnormal result of other cardiovascular function study: Secondary | ICD-10-CM | POA: Insufficient documentation

## 2014-09-16 HISTORY — PX: LEFT HEART CATHETERIZATION WITH CORONARY ANGIOGRAM: SHX5451

## 2014-09-16 SURGERY — LEFT HEART CATHETERIZATION WITH CORONARY ANGIOGRAM

## 2014-09-16 MED ORDER — ACETAMINOPHEN 325 MG PO TABS: 650.0000 mg | ORAL_TABLET | ORAL | Status: DC | PRN

## 2014-09-16 MED ORDER — NITROGLYCERIN 1 MG/10 ML FOR IR/CATH LAB
INTRA_ARTERIAL | Status: AC
  Filled 2014-09-16: qty 10

## 2014-09-16 MED ORDER — HEPARIN (PORCINE) IN NACL 2-0.9 UNIT/ML-% IJ SOLN
INTRAMUSCULAR | Status: AC
  Filled 2014-09-16: qty 1500

## 2014-09-16 MED ORDER — FENTANYL CITRATE 0.05 MG/ML IJ SOLN
INTRAMUSCULAR | Status: AC
  Filled 2014-09-16: qty 2

## 2014-09-16 MED ORDER — HEPARIN SODIUM (PORCINE) 1000 UNIT/ML IJ SOLN
INTRAMUSCULAR | Status: AC
  Filled 2014-09-16: qty 1

## 2014-09-16 MED ORDER — SODIUM CHLORIDE 0.9 % IV SOLN: INTRAVENOUS | Status: AC

## 2014-09-16 MED ORDER — SODIUM CHLORIDE 0.9 % IV SOLN
INTRAVENOUS | Status: DC
  Administered 2014-09-16: 07:00:00 via INTRAVENOUS

## 2014-09-16 MED ORDER — ONDANSETRON HCL 4 MG/2ML IJ SOLN: 4.0000 mg | Freq: Four times a day (QID) | INTRAMUSCULAR | Status: DC | PRN

## 2014-09-16 MED ORDER — SODIUM CHLORIDE 0.9 % IJ SOLN: 3.0000 mL | INTRAMUSCULAR | Status: DC | PRN

## 2014-09-16 MED ORDER — MIDAZOLAM HCL 2 MG/2ML IJ SOLN
INTRAMUSCULAR | Status: AC
  Filled 2014-09-16: qty 2

## 2014-09-16 MED ORDER — VERAPAMIL HCL 2.5 MG/ML IV SOLN
INTRAVENOUS | Status: AC
  Filled 2014-09-16: qty 2

## 2014-09-16 MED ORDER — ASPIRIN 81 MG PO CHEW: 81.0000 mg | CHEWABLE_TABLET | ORAL | Status: DC

## 2014-09-16 MED ORDER — ASPIRIN 81 MG PO CHEW: 81.0000 mg | CHEWABLE_TABLET | Freq: Every day | ORAL | Status: DC

## 2014-09-16 MED ORDER — LIDOCAINE HCL (PF) 1 % IJ SOLN
INTRAMUSCULAR | Status: AC
  Filled 2014-09-16: qty 30

## 2014-09-16 NOTE — H&P (Signed)
[name]  02/17/1929  [id number]  Primary Physician Deanna Koyanagi, DO Primary Cardiologist: Deanna Harp MD Deanna Shields   HPI: The patient is a delightful 81 year old, mildly overweight, married Caucasian female with no children whose husband Deanna Shields is also a long-term patient of mine. Unfortunately, he has mesothelioma treated by Dr. Julien Shields secondary to remote asbestos exposure. I last saw her in the office 12/25/13.[name]   She was remotely a patient of Dr. Alla Shields. She had a cath done by him Oct 23, 1999, after an abnormal Myoview done because of chest pain that showed minimal CAD.[name] Her risk factors at that time were family history and hyperlipidemia. She does not smoke or drink alcohol. She has been on statin drugs in the past.  Deanna Shields was admitted to the hospital 10/28/13 for 2 days because of chest pain/rule out myocardial function. A 2-D echo and Myoview stress test were normal. She was seen back a week later because of witnessed syncope. Over the last 3-4 months she's noticed jaw. Associated with left shoulder arm back and chest pain with increasing dyspnea on exertion. A myoview stress test was performed that showed moderate ischemia.  Current Outpatient Prescriptions  Medication Sig Dispense Refill  . amLODipine (NORVASC) 2.5 MG tablet Take 1 tablet (2.5 mg total) by mouth daily. 90 tablet 1  . aspirin 81 MG tablet Take 81 mg by mouth daily.     . chlorpheniramine-HYDROcodone (TUSSIONEX PENNKINETIC ER) 10-8 MG/5ML LQCR Take 5 mLs by mouth every 12 (twelve) hours as needed. 140 mL 0  . fluticasone (FLONASE) 50 MCG/ACT nasal spray Place 2 sprays into both nostrils daily. 16 g 1  . losartan (COZAAR) 100 MG tablet TAKE ONE-HALF (1/2) TABLET DAILY 45 tablet 0  . promethazine (PHENERGAN) 25 MG tablet Take 1 tablet (25 mg total) by mouth 4 (four) times daily as needed for nausea or vomiting. 30 tablet 0  . promethazine  (PHENERGAN) 25 MG/ML injection Inject 0.5 mLs (12.5 mg total) into the muscle every 6 (six) hours as needed for nausea or vomiting. 2 mL 0   No current facility-administered medications for this visit.    Allergies  Allergen Reactions  . Codeine Nausea And Vomiting    in large doses: can tolerate Tussionex    History   Social History  . Marital Status: Married    Spouse Name: N/A  . Number of Children: 0  . Years of Education: N/A   Occupational History  . retired     Geneticist, molecular   Social History Main Topics  . Smoking status: Never Smoker   . Smokeless tobacco: Never Used  . Alcohol Use: No  . Drug Use: No  . Sexual Activity: Not on file   Other Topics Concern  . Not on file   Social History Narrative   Patient doesn't get regular exercise   married     Review of Systems: General: negative for chills, fever, night sweats or weight changes.  Cardiovascular: negative for chest pain, dyspnea on exertion, edema, orthopnea, palpitations, paroxysmal nocturnal dyspnea or shortness of breath Dermatological: negative for rash Respiratory: negative for cough or wheezing Urologic: negative for hematuria Abdominal: negative for nausea, vomiting, diarrhea, bright red blood per rectum, melena, or hematemesis Neurologic: negative for visual changes, syncope, or dizziness All other systems reviewed and are otherwise negative except as noted above.    Blood pressure 142/80, pulse 74, height 5' 4.5" (1.638 m), weight 170 lb (77.111 kg).  General  appearance: alert and no distress Neck: no adenopathy, no carotid bruit, no JVD, supple, symmetrical, trachea midline and thyroid not enlarged, symmetric, no tenderness/mass/nodules Lungs: clear to auscultation bilaterally Heart: regular rate and rhythm, S1, S2 normal, no murmur, click, rub or gallop Extremities: extremities normal, atraumatic, no  cyanosis or edema  EKG normal sinus rhythm at 74 with nonspecific ST and T-wave changes. I personally reviewed this EKG  ASSESSMENT AND PLAN:   Essential hypertension History of hypertension blood pressure measured at 142/80. She is on low-dose amlodipine and losartan. Continue current meds at current dose   Chest pain Miss Deanna Shields has complained of jaw shoulder arm chest and back pain along with dyspnea over the last several months has become progressively worse over time. I'm going to increase her amlodipine from 2.5-5 mg a day, add low-dose Imdur. A myoview stress test was performed that showed moderate ischemia. The patient presents now for OP cardiac cath.      Deanna Shields, M.D., [place], [facility], [name] [address] [place], [place]  [address]  [phone] 09/16/2014 6:48 AM

## 2014-09-16 NOTE — Interval H&P Note (Signed)
Cath Lab Visit (complete for each Cath Lab visit)  Clinical Evaluation Leading to the Procedure:   ACS: No.  Non-ACS:    Anginal Classification: CCS III  Anti-ischemic medical therapy: Minimal Therapy (1 class of medications)  Non-Invasive Test Results: Intermediate-risk stress test findings: cardiac mortality 1-3%/year  Prior CABG: No previous CABG      History and Physical Interval Note:  09/16/2014 7:34 AM  Deanna Shields  has presented today for surgery, with the diagnosis of angina  The various methods of treatment have been discussed with the patient and family. After consideration of risks, benefits and other options for treatment, the patient has consented to  Procedure(s): LEFT HEART CATHETERIZATION WITH CORONARY ANGIOGRAM (N/A) as a surgical intervention .  The patient's history has been reviewed, patient examined, no change in status, stable for surgery.  I have reviewed the patient's chart and labs.  Questions were answered to the patient's satisfaction.     [name]

## 2014-09-16 NOTE — CV Procedure (Signed)
Deanna Shields is a 81 y.o. female    [id number] LOCATION:  FACILITY: [facility]  PHYSICIAN: Quay Burow, M.D. 28-Feb-1929   DATE OF PROCEDURE:  09/16/2014  DATE OF DISCHARGE:     CARDIAC CATHETERIZATION     History obtained from chart review.The patient is a delightful 81 year old, mildly overweight, married Caucasian female with no children whose husband Deanna Shields is also a long-term patient of mine. Unfortunately, he has mesothelioma treated by Dr. Julien [place]mann secondary to remote asbestos exposure. I last saw her in the office 12/25/13.[name]   She was remotely a patient of Dr. Alla German. She had a cath done by him Oct 23, 1999, after an abnormal Myoview done because of chest pain that showed minimal CAD.[name] Her risk factors at that time were family history and hyperlipidemia. She does not smoke or drink alcohol. She has been on statin drugs in the past.  [name] was admitted to the hospital 10/28/13 for 2 days because of chest pain/rule out myocardial function. A 2-D echo and Myoview stress test were normal. She was seen back a week later because of witnessed syncope. Over the last 3-4 months she's noticed jaw pain with associated with left shoulder arm back and chest pain with increasing dyspnea on exertion. A myoview stress test was performed that showed moderate inferoapical ischemia. A 2-D echo was essentially normal. She presents now for outpatient diagnostic coronary arteriography to define her anatomy and rule out an ischemic etiology.   PROCEDURE DESCRIPTION:   The patient was brought to the second floor [facility] cath lab in the postabsorptive state. She was premedicated with Valium 5 mg by mouth, IV Versed and fentanyl. Her right wrist was prepped and shaved in usual sterile fashion. Xylocaine 1% was used for local anesthesia. A 5/6 French sheath was inserted into the right radial artery using standard Seldinger technique. The patient received 3500 units  of heparin  intravenously.   A 5 [place] TIG catheter and pigtail catheters were used for selective coronary angiography and left ventriculography respectively. Visipaque dye was used for the entirety of the case. Retrograde aortic, left ventricular and pullback pressures were recorded. She received radial cocktail via the SideArm sheath.     HEMODYNAMICS:    AO SYSTOLIC/AO DIASTOLIC: [id number]   LV SYSTOLIC/LV DIASTOLIC: [id number]  ANGIOGRAPHIC RESULTS:   1. Left main; normal  2. LAD; minor irregularities 3. Left circumflex; nondominant and normal.  4. Right coronary artery; dominant and normal 5. Left ventriculography; RAO left ventriculogram was performed using  25 mL of Visipaque dye at 12 mL/second. The overall LVEF estimated  60 %  Without wall motion abnormalities  IMPRESSION:[name]  has minimal CAD and normal LV function. I believe her chest pain is noncardiac and her Myoview false-positive. Her sheath was removed and a TR band was placed on the right wrist to achieve patent hemostasis. The patient left the lab in stable condition. She'll be gently hydrated and discharged home in several hours. She will see mid-level provider back in the office in 2-3 weeks on a daily diameter.  Deanna Harp MD, [facility] 09/16/2014 8:15 AM

## 2014-09-16 NOTE — H&P (View-Only) (Signed)
Miss [place] returns for follow-up of her outpatient diagnostic tests performed in evaluation of progressive back and chest pain. Her echo was normal however her Myoview was in immediate risk and remarkable for inferolateral ischemia. Based on this we decided to proceed with outpatient diagnostic coronary angiography the right radial approach to define her anatomy. Of note. I did increase her amlodipine and begin him door which caused dizziness. Imdur has since been discontinued.   [name], M.D., [place], [facility], [place] [address] [place], [place]  [address]  [phone] 09/05/2014 3:03 PM

## 2014-09-16 NOTE — Discharge Instructions (Signed)
Radial Site Care °Refer to this sheet in the next few weeks. These instructions provide you with information on caring for yourself after your procedure. Your caregiver may also give you more specific instructions. Your treatment has been planned according to current medical practices, but problems sometimes occur. Call your caregiver if you have any problems or questions after your procedure. °HOME CARE INSTRUCTIONS °· You may shower the day after the procedure. Remove the bandage (dressing) and gently wash the site with plain soap and water. Gently pat the site dry. °· Do not apply powder or lotion to the site. °· Do not submerge the affected site in water for 3 to 5 days. °· Inspect the site at least twice daily. °· Do not flex or bend the affected arm for 24 hours. °· No lifting over 5 pounds (2.3 kg) for 5 days after your procedure. °· Do not drive home if you are discharged the same day of the procedure. Have someone else drive you. °· You may drive 24 hours after the procedure unless otherwise instructed by your caregiver. °· Do not operate machinery or power tools for 24 hours. °· A responsible adult should be with you for the first 24 hours after you arrive home. °What to expect: °· Any bruising will usually fade within 1 to 2 weeks. °· Blood that collects in the tissue (hematoma) may be painful to the touch. It should usually decrease in size and tenderness within 1 to 2 weeks. °SEEK IMMEDIATE MEDICAL CARE IF: °· You have unusual pain at the radial site. °· You have redness, warmth, swelling, or pain at the radial site. °· You have drainage (other than a small amount of blood on the dressing). °· You have chills. °· You have a fever or persistent symptoms for more than 72 hours. °· You have a fever and your symptoms suddenly get worse. °· Your arm becomes pale, cool, tingly, or numb. °· You have heavy bleeding from the site. Hold pressure on the site. °Document Released: 06/26/2010 Document Revised:  08/16/2011 Document Reviewed: 06/26/2010 °ExitCare® Patient Information ©2015 [employer]. This information is not intended to replace advice given to you by your health care provider. Make sure you discuss any questions you have with your health care provider. ° °

## 2014-09-18 ENCOUNTER — Telehealth: Payer: Self-pay | Admitting: Cardiovascular Disease

## 2014-09-18 ENCOUNTER — Ambulatory Visit: Payer: Medicare Other | Admitting: Cardiovascular Disease

## 2014-09-18 NOTE — Telephone Encounter (Signed)
Closed encounter °

## 2014-10-10 ENCOUNTER — Ambulatory Visit: Payer: Medicare Other | Admitting: Cardiology

## 2014-10-22 ENCOUNTER — Other Ambulatory Visit: Payer: Self-pay | Admitting: Cardiology

## 2014-10-22 ENCOUNTER — Encounter: Payer: Self-pay | Admitting: Cardiology

## 2014-10-22 ENCOUNTER — Ambulatory Visit (INDEPENDENT_AMBULATORY_CARE_PROVIDER_SITE_OTHER): Payer: Medicare Other | Admitting: Cardiology

## 2014-10-22 ENCOUNTER — Other Ambulatory Visit: Payer: Self-pay | Admitting: *Deleted

## 2014-10-22 VITALS — BP 150/84 | HR 90 | Ht 65.0 in | Wt 155.8 lb

## 2014-10-22 DIAGNOSIS — R079 Chest pain, unspecified: Secondary | ICD-10-CM

## 2014-10-22 DIAGNOSIS — I2510 Atherosclerotic heart disease of native coronary artery without angina pectoris: Secondary | ICD-10-CM | POA: Diagnosis not present

## 2014-10-22 MED ORDER — AMLODIPINE BESYLATE 5 MG PO TABS: 5.0000 mg | ORAL_TABLET | Freq: Every day | ORAL | Status: DC

## 2014-10-22 NOTE — Patient Instructions (Signed)
Your physician wants you to follow-up in: 6 MONTHS WITH DR BERRY You will receive a reminder letter in the mail two months in advance. If you don't receive a letter, please call our office to schedule the follow-up appointment.  

## 2014-10-22 NOTE — Progress Notes (Signed)
Cardiology Office Note   Date:  10/22/2014   ID:  [place], DOB 09-17-1928, MRN [id number]  PCP:  Garnet Koyanagi, DO  Cardiologist:  Dr. [name]    Chief Complaint  Patient presents with  . Follow-up      History of Present Illness: Deanna Shields is a 81 y.o. female who presents for post hospitalization for cath.   She was remotely a patient of Dr. Alla German. She had a cath done by him Oct 23, 1999, after an abnormal Myoview done because of chest pain that showed minimal CAD.[name] Her risk factors at that time were family history and hyperlipidemia. She does not smoke or drink alcohol. She has been on statin drugs in the past.  Deanna Shields was admitted to the hospital 10/28/13 for 2 days because of chest pain/rule out myocardial function. A 2-D echo and Myoview stress test were normal. She was seen back a week later because of witnessed syncope. Over the last 3-4 months she's noticed jaw pain with associated with left shoulder arm back and chest pain with increasing dyspnea on exertion. A myoview stress test was performed that showed moderate inferoapical ischemia. A 2-D echo was essentially normal. She presents now for outpatient diagnostic coronary arteriography to define her anatomy and rule out an ischemic etiology.  Cath revealed:  minimal CAD and normal LV function. Dr. [name] believes her chest pain is noncardiac and her Myoview false-positive.  Cath: 1. Left main; normal  2. LAD; minor irregularities 3. Left circumflex; nondominant and normal.  4. Right coronary artery; dominant and normal 5. Left ventriculography; RAO left ventriculogram was performed using  25 mL of Visipaque dye at 12 mL/second. The overall LVEF estimated  60 % Without wall motion abnormalities  Today no complaints from cardiology perspective.  + GI issues with N,V & diarrhea.  To follow with GI.   Past Medical History  Diagnosis Date  . Hypertension   . Rhinitis, allergic   . Hemorrhoids 2004     Colonoscopy  . Hiatal hernia 2004    EGD  . Esophageal stricture 2009    EGD   . GERD (gastroesophageal reflux disease) 2004    EGD  . Arthritis   . Dizziness   . Syncopal episodes     Past Surgical History  Procedure Laterality Date  . Abdominal hysterectomy    . Hemorroidectomy    . Knee arthroscopy Bilateral     right x 2  . Carpal tunnel release Right   . Nm myoview ltd  06/01/2012    EF 71%  . Cardiac catheterization  10/23/1999    EF 60% No significant CAD  . Left heart catheterization with coronary angiogram N/A 09/16/2014    Procedure: LEFT HEART CATHETERIZATION WITH CORONARY ANGIOGRAM;  Surgeon: [name], MD;  Location: [facility] CATH LAB;  Service: Cardiovascular;  Laterality: N/A;     Current Outpatient Prescriptions  Medication Sig Dispense Refill  . amLODipine (NORVASC) 5 MG tablet Take 1 tablet (5 mg total) by mouth daily. 30 tablet 6  . aspirin 81 MG tablet Take 81 mg by mouth daily.     . Calcium Citrate (CITRACAL PO) Take 1 tablet by mouth daily.    . chlorpheniramine-HYDROcodone (TUSSIONEX PENNKINETIC ER) 10-8 MG/5ML LQCR Take 5 mLs by mouth every 12 (twelve) hours as needed. 140 mL 0  . losartan (COZAAR) 100 MG tablet TAKE ONE-HALF (1/2) TABLET DAILY 45 tablet 0  . promethazine (PHENERGAN) 25 MG tablet Take 1  tablet (25 mg total) by mouth 4 (four) times daily as needed for nausea or vomiting. 30 tablet 0   No current facility-administered medications for this visit.    Allergies:   Codeine    Social History:  The patient  reports that she has never smoked. She has never used smokeless tobacco. She reports that she does not drink alcohol or use illicit drugs.   Family History:  The patient's family history includes Heart disease in her sister; Stroke (age of onset: 70) in her father. There is no history of Coronary artery disease, Diabetes, or Cancer.    ROS:  General:no colds or fevers, no weight changes HEENT:no blurred vision, no  congestion CV:see HPI PUL:see HPI GI:+ diarrhea no constipation or melena, no indigestion, + vomiting- to see GI GU:no hematuria, no dysuria Neuro:no syncope, + lightheadedness followed by PCP   Wt Readings from Last 3 Encounters:  10/22/14 155 lb 12.8 oz (70.67 kg)  09/05/14 165 lb (74.844 kg)  08/28/14 170 lb (77.111 kg)     PHYSICAL EXAM: VS:  BP 150/84 mmHg  Pulse 90  Ht 5\' 5"  (1.651 m)  Wt 155 lb 12.8 oz (70.67 kg)  BMI 25.93 kg/m2 , BMI Body mass index is 25.93 kg/(m^2). General:Pleasant affect, NAD Heart:S1S2 RRR without murmur, gallup, rub or click Lungs:clear without rales, rhonchi, or wheezes [id number], non tender, + BS, do not palpate liver spleen or masses Ext:no lower ext edema, 2+ pedal pulses, 2+ radial pulses Neuro:alert and oriented X 3, MAE, follows commands, + facial symmetry    EKG:  EKG is NOT rdered today.    Recent Labs: 10/28/2013: Pro B Natriuretic peptide (BNP) 30.8; TSH 2.780 09/09/2014: ALT 13; BUN 21; Creatinine 1.04; Hemoglobin 14.2; Platelets 277; Potassium 5.2; Sodium 138    Lipid Panel    Component Value Date/Time   CHOL 178 08/19/2014 1151   TRIG 117.0 08/19/2014 1151   HDL 52.60 08/19/2014 1151   CHOLHDL 3 08/19/2014 1151   VLDL 23.4 08/19/2014 1151   LDLCALC 102* 08/19/2014 1151   LDLDIRECT 161.2 07/18/2013 1118       Other studies Reviewed: Additional studies/ records that were reviewed today include: cath report.   ASSESSMENT AND PLAN:  1.  CAD, minimal, non obstructive, follow up with Dr. Adora Fridge in 6 months.  2.  HTN BP elevated today but at friend's home it is stable. 135-137/70  She will call if 0000000 systolic,  Would adjust meds at that time.  3.  Recent N,V & diarrhea, has had episodically and will see Dr. Hilarie Fredrickson in July.       Current medicines are reviewed with the patient today.  The patient Has no concerns regarding medicines.  The following changes have been made:  See above Labs/ tests ordered today  include:see above  Disposition:   FU:  see above  Signed, Deanna Serge, NP  10/22/2014 11:02 AM    [address], [place], [place], [place] Phone: [phone]; Fax: [phone]

## 2014-10-22 NOTE — Telephone Encounter (Signed)
Rx(s) sent to pharmacy electronically.  

## 2014-10-23 ENCOUNTER — Telehealth: Payer: Self-pay | Admitting: Family Medicine

## 2014-10-23 DIAGNOSIS — R05 Cough: Secondary | ICD-10-CM

## 2014-10-23 DIAGNOSIS — R059 Cough, unspecified: Secondary | ICD-10-CM

## 2014-10-23 NOTE — Telephone Encounter (Signed)
Caller name:[place]  Relationship to patient:SELF Can be reached:[phone] Pharmacy:[facility]  Reason for call:TUSSINEX

## 2014-10-23 NOTE — Telephone Encounter (Signed)
Last seen and filled 08/19/14 #164ml   Please advise     KP

## 2014-10-24 MED ORDER — HYDROCOD POLST-CPM POLST ER 10-8 MG/5ML PO SUER: 5.0000 mL | Freq: Two times a day (BID) | ORAL | Status: DC | PRN

## 2014-10-24 NOTE — Telephone Encounter (Signed)
Patient aware Rx ready for pick up.      KP 

## 2014-10-24 NOTE — Telephone Encounter (Signed)
Refill x1 

## 2014-12-11 ENCOUNTER — Ambulatory Visit (INDEPENDENT_AMBULATORY_CARE_PROVIDER_SITE_OTHER): Payer: Medicare Other | Admitting: Internal Medicine

## 2014-12-11 ENCOUNTER — Encounter: Payer: Self-pay | Admitting: Internal Medicine

## 2014-12-11 VITALS — BP 126/76 | HR 85 | Ht 64.25 in | Wt 172.0 lb

## 2014-12-11 DIAGNOSIS — R1011 Right upper quadrant pain: Secondary | ICD-10-CM | POA: Diagnosis not present

## 2014-12-11 DIAGNOSIS — R197 Diarrhea, unspecified: Secondary | ICD-10-CM | POA: Diagnosis not present

## 2014-12-11 DIAGNOSIS — R112 Nausea with vomiting, unspecified: Secondary | ICD-10-CM | POA: Diagnosis not present

## 2014-12-11 DIAGNOSIS — K219 Gastro-esophageal reflux disease without esophagitis: Secondary | ICD-10-CM

## 2014-12-11 DIAGNOSIS — I2510 Atherosclerotic heart disease of native coronary artery without angina pectoris: Secondary | ICD-10-CM

## 2014-12-11 MED ORDER — PANTOPRAZOLE SODIUM 40 MG PO TBEC: 40.0000 mg | DELAYED_RELEASE_TABLET | Freq: Every day | ORAL | Status: DC

## 2014-12-11 MED ORDER — ONDANSETRON HCL 4 MG PO TABS: 4.0000 mg | ORAL_TABLET | Freq: Three times a day (TID) | ORAL | Status: DC | PRN

## 2014-12-11 NOTE — Progress Notes (Signed)
Patient ID: Deanna Shields, female   DOB: [birth year], 81 y.o.   MRN: [id number] HPI: Deanna Shields is an 81 year old female with past medical history of GERD, esophageal dysphagia, hiatal hernia, hypertension, arthritis who is seen in follow-up. She was previously seen by Dr. Verl Blalock and this is her first visit with me. She presents alone today. She was last here 2 years ago when she had upper endoscopy performed on 12/13/2012. This revealed normal esophageal mucosa, 2 cm hiatal hernia. 4 French Maloney dilator was passed with no heme. There was mild antral gastropathy which was biopsied and negative for H. pylori. The duodenal mucosa was unremarkable. Prior to this she had colonoscopy performed in March 2004 which was normal except for internal hemorrhoids. At follow-up with Dr. Sharlett Iles 2 years ago colorectal cancer screening was deferred due to patient desire and age.  Today she returns to discuss intermittent issues with nausea, vomiting and diarrhea. This is been happening she reports for 3 or 4 years but very intermittently. It has increased in frequency of late and is occurring about every 3 months. She has been unable to identify a trigger. It always happens after 6 PM or later in the night. She reports developing regurgitation of "hot water" followed by vomiting and diarrhea. The vomiting and diarrhea seem to start at the same time. This last for several hours that she usually feels "wiped out" for several days. She often has decreased appetite for several days. Last episode was approximately 2 months ago. In between these episodes she reports feeling very infrequent nausea but no vomiting or diarrhea. She does have issues with heartburn and globus sensation along with throat clearing and coughing. She denies esophageal dysphagia today. No odynophagia. Bowel movements have been normal without constipation, diarrhea blood or melena. She specifically denies abdominal pain during these attacks.  Outside of the attacks she at times feels right upper quadrant discomfort. She reports she tries to move her thorax or arm to make this pain get better. Again this happens very infrequently. She reports stable weight, good appetite otherwise. No early satiety. No blood in her stool.  She had a cardiac catheterization in April of this year which was unremarkable with no evidence of significant CAD.  Past Medical History  Diagnosis Date  . Hypertension   . Rhinitis, allergic   . Hemorrhoids 2004    Colonoscopy  . Hiatal hernia 2004    EGD  . Esophageal stricture 2009    EGD   . GERD (gastroesophageal reflux disease) 2004    EGD  . Arthritis   . Dizziness   . Syncopal episodes   . H/O cardiac catheterization 09/16/14    patent coronary arteries.    Past Surgical History  Procedure Laterality Date  . Abdominal hysterectomy    . Hemorroidectomy    . Knee arthroscopy Bilateral     right x 2  . Carpal tunnel release Right   . Nm myoview ltd  06/01/2012    EF 71%  . Cardiac catheterization  10/23/1999    EF 60% No significant CAD  . Left heart catheterization with coronary angiogram N/A 09/16/2014    Procedure: LEFT HEART CATHETERIZATION WITH CORONARY ANGIOGRAM;  Surgeon: [name], MD;  Location: [facility] CATH LAB;  Service: Cardiovascular;  Laterality: N/A;    Outpatient Prescriptions Prior to Visit  Medication Sig Dispense Refill  . amLODipine (NORVASC) 2.5 MG tablet Take 1 tablet (2.5 mg total) by mouth daily. 90 tablet 3  .  aspirin 81 MG tablet Take 81 mg by mouth daily.     . Calcium Citrate (CITRACAL PO) Take 1 tablet by mouth daily.    . chlorpheniramine-HYDROcodone (TUSSIONEX PENNKINETIC ER) 10-8 MG/5ML SUER Take 5 mLs by mouth every 12 (twelve) hours as needed for cough. 140 mL 0  . losartan (COZAAR) 100 MG tablet TAKE ONE-HALF (1/2) TABLET DAILY 45 tablet 0  . amLODipine (NORVASC) 5 MG tablet Take 1 tablet (5 mg total) by mouth daily. (Patient not taking: Reported on  12/11/2014) 30 tablet 8  . promethazine (PHENERGAN) 25 MG tablet Take 1 tablet (25 mg total) by mouth 4 (four) times daily as needed for nausea or vomiting. (Patient not taking: Reported on 12/11/2014) 30 tablet 0   No facility-administered medications prior to visit.    Allergies  Allergen Reactions  . Codeine Nausea And Vomiting    in large doses: can tolerate Tussionex    Family History  Problem Relation Age of Onset  . Stroke Father 55    light stroke  . Heart disease Sister   . Coronary artery disease Neg Hx   . Diabetes Neg Hx   . Cancer Neg Hx     breast, colon, prostate    History  Substance Use Topics  . Smoking status: Never Smoker   . Smokeless tobacco: Never Used  . Alcohol Use: No    ROS: As per history of present illness, otherwise negative  BP 126/76 mmHg  Pulse 85  Ht 5' 4.25" (1.632 m)  Wt 172 lb (78.019 kg)  BMI 29.29 kg/m2  SpO2 96% Constitutional: Well-developed and well-nourished. No distress. HEENT: Normocephalic and atraumatic. Oropharynx is clear and moist. No oropharyngeal exudate. Conjunctivae are normal.  No scleral icterus. Neck: Neck supple. Trachea midline. Cardiovascular: Normal rate, regular rhythm and intact distal pulses. No M/R/G Pulmonary/chest: Effort normal and breath sounds normal. No wheezing, rales or rhonchi. Abdominal: Soft, nontender, nondistended. Bowel sounds active throughout.  Extremities: no clubbing, cyanosis, or edema Lymphadenopathy: No cervical adenopathy noted. Neurological: Alert and oriented to person place and time. Skin: Skin is warm and dry. No rashes noted. Psychiatric: Normal mood and affect. Behavior is normal.  RELEVANT LABS AND IMAGING: CBC    Component Value Date/Time   WBC 8.4 09/09/2014 0933   RBC 4.42 09/09/2014 0933   HGB 14.2 09/09/2014 0933   HCT 43.3 09/09/2014 0933   PLT 277 09/09/2014 0933   MCV 98.0 09/09/2014 0933   MCH 32.1 09/09/2014 0933   MCHC 32.8 09/09/2014 0933   RDW 13.5  09/09/2014 0933   LYMPHSABS 2.5 07/18/2013 1118   MONOABS 0.6 07/18/2013 1118   EOSABS 0.2 07/18/2013 1118   BASOSABS 0.0 07/18/2013 1118    CMP     Component Value Date/Time   NA 138 09/09/2014 0933   K 5.2 09/09/2014 0933   CL 98 09/09/2014 0933   CO2 30 09/09/2014 0933   GLUCOSE 93 09/09/2014 0933   BUN 21 09/09/2014 0933   CREATININE 1.04 09/09/2014 0933   CREATININE 0.96 08/19/2014 1151   CALCIUM 10.0 09/09/2014 0933   PROT 6.7 09/09/2014 0933   ALBUMIN 4.4 09/09/2014 0933   AST 20 09/09/2014 0933   ALT 13 09/09/2014 0933   ALKPHOS 80 09/09/2014 0933   BILITOT 0.8 09/09/2014 0933   GFRNONAA 53* 11/11/2013 0404   GFRAA 61* 11/11/2013 0404   EGD and colonoscopy reviewed as per history of present illness including with the patient  ASSESSMENT/PLAN:  81 year old female with past  medical history of GERD, esophageal dysphagia, hiatal hernia, hypertension, arthritis who is seen in follow-up.  1. Intermittent/episodic nausea/vomiting and diarrhea -- question of intermittent partial obstruction is raised though she is having diarrhea with these attacks making obstruction less likely. She does have a history of reflux disease and has GERD symptoms. I would like to restart her on PPI to see if this improves heartburn but also decreases these attacks. Begin pantoprazole 40 mg daily. Zofran 4 mg ODT every 8 hours as needed for nausea. Abdominal ultrasound to evaluate gallbladder as gallstones or biliary attacks are certainly possible. I also would like her to come for CBC, CMP and lipase after her next attack. These orders can be written is standing. If ultrasound unremarkable would consider cross-sectional imaging with CT scan plus contrast. Upper endoscopy unrevealing 2 years ago and these attacks predate this endoscopy thus this test is not being repeated at this time.  Follow-up in 8-12 weeks, sooner if necessary 25 minutes spent with the patient today    [id number] R Lowne,  [address] [place], [place] [address]

## 2014-12-11 NOTE — Patient Instructions (Signed)
You have been scheduled for an abdominal ultrasound at [facility] (1st floor of hospital) on 12/18/2014 at 10:30am. Please arrive 15 minutes prior to your appointment for registration. Make certain not to have anything to eat or drink 6 hours prior to your appointment. Should you need to reschedule your appointment, please contact radiology at [phone]. This test typically takes about 30 minutes to perform.  We are sending in Zofran and Protonix to your pharmacy You will need labs drawn during your next attack. Please contact the office if you are having an attack so we can put in your lab orders: CBC,CMET,LIPASE

## 2014-12-18 ENCOUNTER — Ambulatory Visit (HOSPITAL_COMMUNITY)
Admission: RE | Admit: 2014-12-18 | Discharge: 2014-12-18 | Disposition: A | Discharge status: Home / Self Care | Payer: Medicare Other | Source: Ambulatory Visit | Attending: Internal Medicine | Admitting: Internal Medicine

## 2014-12-18 DIAGNOSIS — R112 Nausea with vomiting, unspecified: Secondary | ICD-10-CM

## 2014-12-18 DIAGNOSIS — K8020 Calculus of gallbladder without cholecystitis without obstruction: Secondary | ICD-10-CM | POA: Insufficient documentation

## 2014-12-19 ENCOUNTER — Telehealth: Payer: Self-pay | Admitting: Internal Medicine

## 2014-12-19 NOTE — Telephone Encounter (Signed)
Spoke with pt and she is aware, see result note. 

## 2014-12-27 ENCOUNTER — Telehealth: Payer: Self-pay | Admitting: Internal Medicine

## 2014-12-27 MED ORDER — PANTOPRAZOLE SODIUM 40 MG PO TBEC: 40.0000 mg | DELAYED_RELEASE_TABLET | Freq: Every day | ORAL | Status: DC

## 2014-12-27 NOTE — Telephone Encounter (Signed)
Pt rx sent she is aware

## 2015-01-03 ENCOUNTER — Telehealth: Payer: Self-pay | Admitting: Internal Medicine

## 2015-01-03 NOTE — Telephone Encounter (Signed)
Patient wanted her appt moved up with Dr. Donne Hazel.  She is given the number to his office to check if they have any cancellations

## 2015-01-06 ENCOUNTER — Telehealth: Payer: Self-pay | Admitting: Family Medicine

## 2015-01-06 MED ORDER — HYDROCOD POLST-CPM POLST ER 10-8 MG/5ML PO SUER: 5.0000 mL | Freq: Two times a day (BID) | ORAL | Status: DC | PRN

## 2015-01-06 NOTE — Telephone Encounter (Signed)
last seen 08/09/14 and filled 10/24/14   Please advise     KP

## 2015-01-06 NOTE — Telephone Encounter (Signed)
Caller name: [place] Relationship to patient: self Can be reached: [phone] Pharmacy: uses [facility]  Reason for call: Pt asking for refill on Tussinex cough med. Her cough is back again. Almost out. She states she will pick up RX and have filled downstairs.

## 2015-01-06 NOTE — Telephone Encounter (Signed)
Refill x1 

## 2015-01-06 NOTE — Telephone Encounter (Signed)
Patient Rx will be ready for pick up after 3.    KP

## 2015-01-15 ENCOUNTER — Encounter: Payer: Self-pay | Admitting: *Deleted

## 2015-01-16 ENCOUNTER — Other Ambulatory Visit: Payer: Self-pay | Admitting: Family Medicine

## 2015-01-21 ENCOUNTER — Other Ambulatory Visit: Payer: Self-pay | Admitting: General Surgery

## 2015-01-21 ENCOUNTER — Other Ambulatory Visit: Payer: Self-pay

## 2015-01-21 DIAGNOSIS — R1031 Right lower quadrant pain: Principal | ICD-10-CM

## 2015-01-21 DIAGNOSIS — G8929 Other chronic pain: Secondary | ICD-10-CM

## 2015-01-21 DIAGNOSIS — K8020 Calculus of gallbladder without cholecystitis without obstruction: Secondary | ICD-10-CM | POA: Diagnosis not present

## 2015-01-22 ENCOUNTER — Ambulatory Visit
Admission: RE | Admit: 2015-01-22 | Discharge: 2015-01-22 | Disposition: A | Discharge status: Home / Self Care | Payer: Medicare Other | Source: Ambulatory Visit | Attending: General Surgery | Admitting: General Surgery

## 2015-01-22 ENCOUNTER — Encounter (HOSPITAL_COMMUNITY): Payer: Self-pay | Admitting: *Deleted

## 2015-01-22 ENCOUNTER — Emergency Department (HOSPITAL_COMMUNITY)
Admission: EM | Admit: 2015-01-22 | Discharge: 2015-01-22 | Disposition: A | Discharge status: Home / Self Care | Payer: Medicare Other | Attending: Emergency Medicine | Admitting: Emergency Medicine

## 2015-01-22 DIAGNOSIS — R251 Tremor, unspecified: Secondary | ICD-10-CM | POA: Diagnosis not present

## 2015-01-22 DIAGNOSIS — K219 Gastro-esophageal reflux disease without esophagitis: Secondary | ICD-10-CM | POA: Insufficient documentation

## 2015-01-22 DIAGNOSIS — R21 Rash and other nonspecific skin eruption: Secondary | ICD-10-CM | POA: Insufficient documentation

## 2015-01-22 DIAGNOSIS — K8020 Calculus of gallbladder without cholecystitis without obstruction: Secondary | ICD-10-CM | POA: Diagnosis not present

## 2015-01-22 DIAGNOSIS — Z9889 Other specified postprocedural states: Secondary | ICD-10-CM | POA: Diagnosis not present

## 2015-01-22 DIAGNOSIS — K429 Umbilical hernia without obstruction or gangrene: Secondary | ICD-10-CM | POA: Diagnosis not present

## 2015-01-22 DIAGNOSIS — Z79899 Other long term (current) drug therapy: Secondary | ICD-10-CM | POA: Insufficient documentation

## 2015-01-22 DIAGNOSIS — I6789 Other cerebrovascular disease: Secondary | ICD-10-CM | POA: Diagnosis not present

## 2015-01-22 DIAGNOSIS — T782XXA Anaphylactic shock, unspecified, initial encounter: Secondary | ICD-10-CM | POA: Diagnosis not present

## 2015-01-22 DIAGNOSIS — I10 Essential (primary) hypertension: Secondary | ICD-10-CM | POA: Insufficient documentation

## 2015-01-22 DIAGNOSIS — T508X5A Adverse effect of diagnostic agents, initial encounter: Secondary | ICD-10-CM | POA: Insufficient documentation

## 2015-01-22 DIAGNOSIS — G8929 Other chronic pain: Secondary | ICD-10-CM

## 2015-01-22 DIAGNOSIS — M1990 Unspecified osteoarthritis, unspecified site: Secondary | ICD-10-CM | POA: Insufficient documentation

## 2015-01-22 DIAGNOSIS — T886XXA Anaphylactic reaction due to adverse effect of correct drug or medicament properly administered, initial encounter: Secondary | ICD-10-CM | POA: Diagnosis not present

## 2015-01-22 DIAGNOSIS — T7840XA Allergy, unspecified, initial encounter: Secondary | ICD-10-CM | POA: Diagnosis present

## 2015-01-22 DIAGNOSIS — R1031 Right lower quadrant pain: Principal | ICD-10-CM

## 2015-01-22 DIAGNOSIS — Z7982 Long term (current) use of aspirin: Secondary | ICD-10-CM | POA: Insufficient documentation

## 2015-01-22 MED ORDER — IOPAMIDOL (ISOVUE-300) INJECTION 61%
100.0000 mL | Freq: Once | INTRAVENOUS | Status: AC | PRN
  Administered 2015-01-22: 100 mL via INTRAVENOUS

## 2015-01-22 MED ORDER — RANITIDINE HCL 150 MG PO TABS: 150.0000 mg | ORAL_TABLET | Freq: Two times a day (BID) | ORAL | Status: DC

## 2015-01-22 MED ORDER — PREDNISONE 10 MG PO TABS: 20.0000 mg | ORAL_TABLET | Freq: Every day | ORAL | Status: DC

## 2015-01-22 MED ORDER — DIPHENHYDRAMINE HCL 25 MG PO TABS: 50.0000 mg | ORAL_TABLET | Freq: Four times a day (QID) | ORAL | Status: DC

## 2015-01-22 MED ORDER — DEXAMETHASONE 4 MG PO TABS
10.0000 mg | ORAL_TABLET | Freq: Once | ORAL | Status: AC
  Administered 2015-01-22: 10 mg via ORAL
  Filled 2015-01-22: qty 1
  Filled 2015-01-22: qty 2

## 2015-01-22 MED ORDER — EPINEPHRINE 0.3 MG/0.3ML IJ SOAJ: 0.3000 mg | Freq: Once | INTRAMUSCULAR | Status: DC

## 2015-01-22 MED ORDER — DIPHENHYDRAMINE HCL 25 MG PO CAPS
25.0000 mg | ORAL_CAPSULE | Freq: Once | ORAL | Status: AC
  Administered 2015-01-22: 25 mg via ORAL
  Filled 2015-01-22: qty 1

## 2015-01-22 NOTE — ED Notes (Signed)
Patient continues to report itching at rash area/

## 2015-01-22 NOTE — ED Provider Notes (Signed)
CSN: [id number]     Arrival date & time 01/22/15  1914 History   First MD Initiated Contact with Patient 01/22/15 1934     Chief Complaint  Patient presents with  . Allergic Reaction     (Consider location/radiation/quality/duration/timing/severity/associated sxs/prior Treatment) Patient is a 81 y.o. female presenting with allergic reaction. The history is provided by the patient.  Allergic Reaction Presenting symptoms: itching and rash (red, diffuse)   Itching:    Onset quality:  Gradual Rash:    Severity:  Moderate   Progression:  Partially resolved Severity:  Moderate Context comment:  2 hours after contrast dye for abdominal CT Relieved by:  Nothing Worsened by:  Nothing tried Ineffective treatments:  None tried   Past Medical History  Diagnosis Date  . Hypertension   . Rhinitis, allergic   . Hemorrhoids 2004  . Hiatal hernia 2004  . Esophageal stricture 2009  . GERD (gastroesophageal reflux disease) 2004  . Arthritis   . Dizziness   . Syncopal episodes   . H/O cardiac catheterization 09/16/14    patent coronary arteries.  . Gallstones    Past Surgical History  Procedure Laterality Date  . Abdominal hysterectomy    . Hemorroidectomy    . Knee arthroscopy Bilateral     right x 2  . Carpal tunnel release Right   . Nm myoview ltd  06/01/2012    EF 71%  . Cardiac catheterization  10/23/1999    EF 60% No significant CAD  . Left heart catheterization with coronary angiogram N/A 09/16/2014    Procedure: LEFT HEART CATHETERIZATION WITH CORONARY ANGIOGRAM;  Surgeon: [name], MD;  Location: [facility] CATH LAB;  Service: Cardiovascular;  Laterality: N/A;   Family History  Problem Relation Age of Onset  . Stroke Father 88    light stroke  . Heart disease Sister   . Coronary artery disease Neg Hx   . Diabetes Neg Hx   . Cancer Neg Hx     breast, colon, prostate   Social History  Substance Use Topics  . Smoking status: Never Smoker   . Smokeless tobacco: Never  Used  . Alcohol Use: No   OB History    No data available     Review of Systems  Skin: Positive for itching and rash (red, diffuse).  All other systems reviewed and are negative.     Allergies  Codeine and Contrast media  Home Medications   Prior to Admission medications   Medication Sig Start Date End Date Taking? Authorizing Provider  amLODipine (NORVASC) 2.5 MG tablet Take 1 tablet (2.5 mg total) by mouth daily. 10/22/14  Yes [name], MD  aspirin 81 MG tablet Take 81 mg by mouth daily.    Yes Historical Provider, MD  Calcium Citrate (CITRACAL PO) Take 1 tablet by mouth daily.   Yes Historical Provider, MD  chlorpheniramine-HYDROcodone (TUSSIONEX PENNKINETIC ER) 10-8 MG/5ML SUER Take 5 mLs by mouth every 12 (twelve) hours as needed for cough. 01/06/15  Yes Yvonne R Lowne, DO  losartan (COZAAR) 100 MG tablet TAKE ONE-HALF (1/2) TABLET DAILY 06/17/14  Yes Alferd Apa Lowne, DO  omeprazole (PRILOSEC) 40 MG capsule TAKE 1 CAPSULE DAILY Patient not taking: Reported on 01/22/2015 01/16/15   Alferd Apa Lowne, DO  ondansetron (ZOFRAN) 4 MG tablet Take 1 tablet (4 mg total) by mouth every 8 (eight) hours as needed for nausea or vomiting (As needed only). 12/11/14   Jerene Bears, MD  pantoprazole ([place]) 40  MG tablet Take 1 tablet (40 mg total) by mouth daily. Patient not taking: Reported on 01/22/2015 12/27/14   Jerene Bears, MD   BP 146/52 mmHg  Pulse 98  Temp(Src) 98.4 F (36.9 C) (Oral)  Resp 18  SpO2 96% Physical Exam  Constitutional: She is oriented to person, place, and time. She appears well-developed and well-nourished. No distress.  HENT:  Head: Normocephalic.  Mouth/Throat: Oropharynx is clear and moist.  Eyes: Conjunctivae are normal.  Neck: Neck supple. No tracheal deviation present.  Cardiovascular: Normal rate and regular rhythm.   Pulmonary/Chest: Effort normal. No respiratory distress. She has no wheezes. She has no rales.  Abdominal: Soft. She exhibits no  distension.  Neurological: She is alert and oriented to person, place, and time.  Skin: Skin is warm and dry. Rash (diffuse, mild, red pruritic) noted.  Psychiatric: She has a normal mood and affect.    ED Course  Procedures (including critical care time) Labs Review Labs Reviewed - No data to display  Imaging Review Ct Abdomen Pelvis W Contrast  01/22/2015   CLINICAL DATA:  Lower abdomen pain, fatigue, nausea, vomiting  EXAM: CT ABDOMEN AND PELVIS WITH CONTRAST  TECHNIQUE: Multidetector CT imaging of the abdomen and pelvis was performed using the standard protocol following bolus administration of intravenous contrast.  CONTRAST:  142mL ISOVUE-300 IOPAMIDOL (ISOVUE-300) INJECTION 61%  COMPARISON:  Ultrasound of the abdomen of 12/18/2014  FINDINGS: There is a 5 mm noncalcified nodule within the posterior lateral left lower lobe on image 6. CT the chest may be warranted to assess for any additional pulmonary nodules. The liver enhances with no focal abnormality and no ductal dilatation is seen. Noncalcified gallstones are noted within the gallbladder, documented by recent ultrasound. The common bile duct is within normal limits in size measuring 6 mm in diameter distally. No filling defect is seen within the distal common bile duct. The pancreas is normal in size and the pancreatic duct is not dilated. The adrenal glands and spleen are unremarkable. The stomach is moderately fluid distended with no abnormality noted. The kidneys enhance with no calculus or mass and no hydronephrosis is noted. On delayed images, the pelvocaliceal systems are unremarkable and the proximal ureters are normal in caliber. Moderate atherosclerotic disease of the abdominal aorta is noted. No aneurysm is seen. There is a small periumbilical hernia present containing only fat.  The urinary bladder is moderately urine distended. The uterus appears to have been resected previously. No adnexal lesion is seen. No fluid is noted  within the pelvis. The colon is largely decompressed with some feces present. The terminal ileum is unremarkable. The appendix is not definitely seen but no inflammatory process is noted within the right lower quadrant. Fat enters the inguinal canals bilaterally but no bowel enters either canal. Degenerative disc disease is most marked at L4-5.  IMPRESSION: 1. There is a small periumbilical hernia present containing only fat. 2. Fat enters both inguinal canals but no bowel enters either canal. 3. 5 mm noncalcified nodule in the posterolateral left lower lobe. Consider CT of the chest if warranted. 4. Noncalcified gallstones are noted within the gallbladder. 5. Degenerative disc disease at L4-5.   Electronically Signed   By: Ivar Drape M.D.   On: 01/22/2015 14:47   I have personally reviewed and evaluated these images and lab results as part of my medical decision-making.   EKG Interpretation None      MDM   Final diagnoses:  Allergic reaction, initial encounter  Anaphylaxis, initial encounter   81 year old female presents with diffuse rash, vomiting, feeling unwell 2 hours following administration of IV contrast dye for a CT scan on an outpatient basis. In route she was given IM epinephrine, antiemetics, and antihistamines prior to arrival. On arrival here she is no longer vomiting, she does have some persistent rash and was provided with an extra dose of Benadryl for itching. She seems to be improving throughout her emergency department course and was monitored for 3 hours without recurrence of symptoms. She is provided a dose of Decadron here and will be placed on low-dose prednisone to prevent rebound, I recommended scheduling H1 and H2 blockers and she was provided an EpiPen for home in case of rebounding into anaphylaxis which at this point I find unlikely.    Leo Grosser, MD 01/23/15 [phone]

## 2015-01-22 NOTE — ED Notes (Signed)
Bed: WA09 Expected date:  Expected time:  Means of arrival:  Comments: EMS/48F/allergic reaction to IVP dye

## 2015-01-22 NOTE — Discharge Instructions (Signed)

## 2015-01-22 NOTE — ED Notes (Addendum)
Patient was brought in today via EMS d/t allergic reaction from contrast dye after having CT scan completed. Pt has some redness to chest and core areas as well as cyanosis to lips. No respiratory compromise but was tachy on the monitor. Epinephrine, Benadryl, Ranitidine and Zofran given in the field. She reports 2 episodes of vomiting prior to EMS arrival.Pt is A&Ox4

## 2015-01-23 ENCOUNTER — Other Ambulatory Visit: Payer: Self-pay | Admitting: General Surgery

## 2015-01-23 ENCOUNTER — Other Ambulatory Visit: Payer: Self-pay

## 2015-01-23 DIAGNOSIS — R911 Solitary pulmonary nodule: Secondary | ICD-10-CM

## 2015-01-24 ENCOUNTER — Other Ambulatory Visit: Payer: Self-pay

## 2015-01-24 NOTE — Telephone Encounter (Signed)
RX RX request from [facility] requesting to change Losartan to 50 mg daily instead of Losartan 100 mg 1/2 po qd. Per Dr.Lowne, It is ok to change but let the patient know. I tried to call but the line was busy.     KP

## 2015-01-27 NOTE — Pre-Procedure Instructions (Signed)
    [place]  01/27/2015     Your procedure is scheduled on Wednesday, August 24.  Report to [facility] Admitting at 6:30 A.M.               Your surgery is scheduled for 8:30 A.M.   Call this number if you have problems the morning of [phone]   Remember:  Do not eat food or drink liquids after midnight.  Take these medicines the morning of surgery with A SIP OF WATER amLODipine (NORVASC).                Take if needed: Cough medication and medication for nausea.   Do not wear jewelry, make-up or nail polish.  Do not wear lotions, powders, or perfumes.    Do not shave 48 hours prior to surgery.  .  Do not bring valuables to the hospital.  [facility] is not responsible for any belongings or valuables.  Contacts, dentures or bridgework may not be worn into surgery.  Leave your suitcase in the car.  After surgery it may be brought to your room.  For patients admitted to the hospital, discharge time will be determined by your treatment team.  Patients discharged the day of surgery will not be allowed to drive home.   Name and phone number of your driver:   -  Special instructions:  Review  [facility] - Preparing For Surgery.  Please read over the following fact sheets that you were given. Pain Booklet, Coughing and Deep Breathing and Surgical Site Infection Prevention

## 2015-01-28 ENCOUNTER — Encounter (HOSPITAL_COMMUNITY): Payer: Self-pay

## 2015-01-28 ENCOUNTER — Encounter (HOSPITAL_COMMUNITY)
Admission: RE | Admit: 2015-01-28 | Discharge: 2015-01-28 | Disposition: A | Discharge status: Home / Self Care | Payer: Medicare Other | Source: Ambulatory Visit | Attending: General Surgery | Admitting: General Surgery

## 2015-01-28 DIAGNOSIS — K8010 Calculus of gallbladder with chronic cholecystitis without obstruction: Secondary | ICD-10-CM | POA: Diagnosis not present

## 2015-01-28 HISTORY — DX: Personal history of other medical treatment: Z92.89

## 2015-01-28 HISTORY — DX: Unspecified hearing loss, unspecified ear: H91.90

## 2015-01-28 LAB — CBC WITH DIFFERENTIAL/PLATELET
Basophils Absolute: 0 10*3/uL (ref 0.0–0.1)
Basophils Relative: 0 % (ref 0–1)
EOS ABS: 0.3 10*3/uL (ref 0.0–0.7)
Eosinophils Relative: 2 % (ref 0–5)
HCT: 45.5 % (ref 36.0–46.0)
Hemoglobin: 15.3 g/dL — ABNORMAL HIGH (ref 12.0–15.0)
Lymphocytes Relative: 30 % (ref 12–46)
Lymphs Abs: 4.2 10*3/uL — ABNORMAL HIGH (ref 0.7–4.0)
MCH: 31.8 pg (ref 26.0–34.0)
MCHC: 33.6 g/dL (ref 30.0–36.0)
MCV: 94.6 fL (ref 78.0–100.0)
MONOS PCT: 7 % (ref 3–12)
Monocytes Absolute: 1 10*3/uL (ref 0.1–1.0)
NEUTROS PCT: 61 % (ref 43–77)
Neutro Abs: 8.4 10*3/uL — ABNORMAL HIGH (ref 1.7–7.7)
PLATELETS: 290 10*3/uL (ref 150–400)
RBC: 4.81 MIL/uL (ref 3.87–5.11)
RDW: 13.8 % (ref 11.5–15.5)
Smear Review: ADEQUATE
WBC: 13.9 10*3/uL — ABNORMAL HIGH (ref 4.0–10.5)

## 2015-01-28 LAB — COMPREHENSIVE METABOLIC PANEL
ALT: 18 U/L (ref 14–54)
AST: 22 U/L (ref 15–41)
Albumin: 3.9 g/dL (ref 3.5–5.0)
Alkaline Phosphatase: 77 U/L (ref 38–126)
Anion gap: 9 (ref 5–15)
BUN: 14 mg/dL (ref 6–20)
CHLORIDE: 100 mmol/L — AB (ref 101–111)
CO2: 29 mmol/L (ref 22–32)
Calcium: 9.5 mg/dL (ref 8.9–10.3)
Creatinine, Ser: 1.23 mg/dL — ABNORMAL HIGH (ref 0.44–1.00)
GFR calc Af Amer: 45 mL/min — ABNORMAL LOW (ref >60)
GFR, EST NON AFRICAN AMERICAN: 39 mL/min — AB (ref >60)
Glucose, Bld: 96 mg/dL (ref 65–99)
POTASSIUM: 5 mmol/L (ref 3.5–5.1)
SODIUM: 138 mmol/L (ref 135–145)
Total Bilirubin: 0.5 mg/dL (ref 0.3–1.2)
Total Protein: 6.7 g/dL (ref 6.5–8.1)

## 2015-01-28 MED ORDER — CEFAZOLIN SODIUM-DEXTROSE 2-3 GM-% IV SOLR
2.0000 g | INTRAVENOUS | Status: AC
  Administered 2015-01-29: 2 g via INTRAVENOUS
  Filled 2015-01-28: qty 50

## 2015-01-28 NOTE — Progress Notes (Signed)
Mrs Nordstrom reports that she just feels washed out, not having pain at this time.  Mrs Hermida had a  CT last week and had a reaction to the contrast dye, rash redness, itching.  Fraser Din was started on 5 days of Decadron, Rantidine, and Benadryl.  I have been feeling washed out since I had CT scan, I told Dr Donne Hazel. I do feel like I might be able to eat something today.  Labs , CMET and CBC with diff drawn and will be followed.  Patient is for OR tomorrow.

## 2015-01-29 ENCOUNTER — Encounter (HOSPITAL_COMMUNITY): Payer: Self-pay | Admitting: *Deleted

## 2015-01-29 ENCOUNTER — Other Ambulatory Visit: Payer: Medicare Other

## 2015-01-29 ENCOUNTER — Ambulatory Visit (HOSPITAL_COMMUNITY): Payer: Medicare Other | Admitting: Anesthesiology

## 2015-01-29 ENCOUNTER — Encounter (HOSPITAL_COMMUNITY)
Admission: RE | Disposition: A | Discharge status: Home / Self Care | Payer: Self-pay | Source: Ambulatory Visit | Attending: General Surgery

## 2015-01-29 ENCOUNTER — Observation Stay (HOSPITAL_COMMUNITY)
Admission: RE | Admit: 2015-01-29 | Discharge: 2015-01-30 | Disposition: A | Discharge status: Home / Self Care | Payer: Medicare Other | Source: Ambulatory Visit | Attending: General Surgery | Admitting: General Surgery

## 2015-01-29 DIAGNOSIS — Z9049 Acquired absence of other specified parts of digestive tract: Secondary | ICD-10-CM

## 2015-01-29 DIAGNOSIS — K8010 Calculus of gallbladder with chronic cholecystitis without obstruction: Secondary | ICD-10-CM | POA: Diagnosis not present

## 2015-01-29 DIAGNOSIS — M1990 Unspecified osteoarthritis, unspecified site: Secondary | ICD-10-CM | POA: Diagnosis not present

## 2015-01-29 DIAGNOSIS — K219 Gastro-esophageal reflux disease without esophagitis: Secondary | ICD-10-CM | POA: Diagnosis not present

## 2015-01-29 DIAGNOSIS — K8020 Calculus of gallbladder without cholecystitis without obstruction: Secondary | ICD-10-CM | POA: Diagnosis not present

## 2015-01-29 HISTORY — DX: Pain in unspecified shoulder: M25.519

## 2015-01-29 HISTORY — DX: Other chronic pain: G89.29

## 2015-01-29 HISTORY — PX: CHOLECYSTECTOMY: SHX55

## 2015-01-29 HISTORY — PX: LAPAROSCOPIC CHOLECYSTECTOMY: SUR755

## 2015-01-29 SURGERY — LAPAROSCOPIC CHOLECYSTECTOMY
Anesthesia: General | Site: Abdomen

## 2015-01-29 MED ORDER — ASPIRIN EC 81 MG PO TBEC
81.0000 mg | DELAYED_RELEASE_TABLET | Freq: Every day | ORAL | Status: DC
  Administered 2015-01-29: 81 mg via ORAL
  Filled 2015-01-29: qty 1

## 2015-01-29 MED ORDER — ACETAMINOPHEN 325 MG PO TABS: 650.0000 mg | ORAL_TABLET | Freq: Four times a day (QID) | ORAL | Status: DC | PRN

## 2015-01-29 MED ORDER — SUGAMMADEX SODIUM 200 MG/2ML IV SOLN
INTRAVENOUS | Status: AC
  Filled 2015-01-29: qty 2

## 2015-01-29 MED ORDER — FENTANYL CITRATE (PF) 100 MCG/2ML IJ SOLN
INTRAMUSCULAR | Status: AC
  Filled 2015-01-29: qty 2

## 2015-01-29 MED ORDER — PROPOFOL 10 MG/ML IV BOLUS
INTRAVENOUS | Status: DC | PRN
  Administered 2015-01-29: 100 mg via INTRAVENOUS

## 2015-01-29 MED ORDER — AMLODIPINE BESYLATE 2.5 MG PO TABS: 2.5000 mg | ORAL_TABLET | Freq: Every day | ORAL | Status: DC

## 2015-01-29 MED ORDER — EPHEDRINE SULFATE 50 MG/ML IJ SOLN
INTRAMUSCULAR | Status: AC
  Filled 2015-01-29: qty 1

## 2015-01-29 MED ORDER — PROPOFOL 10 MG/ML IV BOLUS
INTRAVENOUS | Status: AC
  Filled 2015-01-29: qty 20

## 2015-01-29 MED ORDER — ASPIRIN 81 MG PO TABS: 81.0000 mg | ORAL_TABLET | Freq: Every day | ORAL | Status: DC

## 2015-01-29 MED ORDER — STERILE WATER FOR INJECTION IJ SOLN
INTRAMUSCULAR | Status: AC
  Filled 2015-01-29: qty 10

## 2015-01-29 MED ORDER — ONDANSETRON HCL 4 MG/2ML IJ SOLN
INTRAMUSCULAR | Status: AC
  Filled 2015-01-29: qty 2

## 2015-01-29 MED ORDER — FENTANYL CITRATE (PF) 100 MCG/2ML IJ SOLN
25.0000 ug | INTRAMUSCULAR | Status: DC | PRN
  Administered 2015-01-29 (×2): 25 ug via INTRAVENOUS
  Administered 2015-01-29 (×2): 50 ug via INTRAVENOUS

## 2015-01-29 MED ORDER — ONDANSETRON HCL 4 MG/2ML IJ SOLN
INTRAMUSCULAR | Status: DC | PRN
Start: 2015-01-29 — End: 2015-01-29
  Administered 2015-01-29: 4 mg via INTRAVENOUS

## 2015-01-29 MED ORDER — ONDANSETRON 4 MG PO TBDP: 4.0000 mg | ORAL_TABLET | Freq: Four times a day (QID) | ORAL | Status: DC | PRN

## 2015-01-29 MED ORDER — FENTANYL CITRATE (PF) 250 MCG/5ML IJ SOLN
INTRAMUSCULAR | Status: AC
  Filled 2015-01-29: qty 5

## 2015-01-29 MED ORDER — OXYCODONE HCL 5 MG PO TABS
5.0000 mg | ORAL_TABLET | ORAL | Status: DC | PRN
  Administered 2015-01-29 – 2015-01-30 (×3): 10 mg via ORAL
  Filled 2015-01-29 (×2): qty 2

## 2015-01-29 MED ORDER — SODIUM CHLORIDE 0.9 % IR SOLN
Status: DC | PRN
  Administered 2015-01-29: 1000 mL

## 2015-01-29 MED ORDER — FAMOTIDINE 20 MG PO TABS: 20.0000 mg | ORAL_TABLET | Freq: Two times a day (BID) | ORAL | Status: DC

## 2015-01-29 MED ORDER — MORPHINE SULFATE (PF) 2 MG/ML IV SOLN: 2.0000 mg | INTRAVENOUS | Status: DC | PRN

## 2015-01-29 MED ORDER — FENTANYL CITRATE (PF) 100 MCG/2ML IJ SOLN
INTRAMUSCULAR | Status: DC | PRN
  Administered 2015-01-29 (×4): 50 ug via INTRAVENOUS

## 2015-01-29 MED ORDER — LACTATED RINGERS IV SOLN
INTRAVENOUS | Status: DC | PRN
  Administered 2015-01-29: 08:00:00 via INTRAVENOUS

## 2015-01-29 MED ORDER — 0.9 % SODIUM CHLORIDE (POUR BTL) OPTIME
TOPICAL | Status: DC | PRN
  Administered 2015-01-29: 1000 mL

## 2015-01-29 MED ORDER — ROCURONIUM BROMIDE 100 MG/10ML IV SOLN
INTRAVENOUS | Status: DC | PRN
  Administered 2015-01-29: 25 mg via INTRAVENOUS

## 2015-01-29 MED ORDER — SUCCINYLCHOLINE CHLORIDE 20 MG/ML IJ SOLN
INTRAMUSCULAR | Status: AC
  Filled 2015-01-29: qty 1

## 2015-01-29 MED ORDER — PREDNISONE 20 MG PO TABS: 20.0000 mg | ORAL_TABLET | Freq: Every day | ORAL | Status: DC

## 2015-01-29 MED ORDER — ONDANSETRON HCL 4 MG/2ML IJ SOLN: 4.0000 mg | Freq: Four times a day (QID) | INTRAMUSCULAR | Status: DC | PRN

## 2015-01-29 MED ORDER — SUGAMMADEX SODIUM 500 MG/5ML IV SOLN
INTRAVENOUS | Status: DC | PRN
  Administered 2015-01-29: 160 mg via INTRAVENOUS

## 2015-01-29 MED ORDER — BUPIVACAINE-EPINEPHRINE (PF) 0.25% -1:200000 IJ SOLN
INTRAMUSCULAR | Status: AC
  Filled 2015-01-29: qty 30

## 2015-01-29 MED ORDER — SODIUM CHLORIDE 0.9 % IV SOLN
INTRAVENOUS | Status: DC
  Administered 2015-01-29 – 2015-01-30 (×2): via INTRAVENOUS

## 2015-01-29 MED ORDER — DEXAMETHASONE SODIUM PHOSPHATE 4 MG/ML IJ SOLN
INTRAMUSCULAR | Status: AC
  Filled 2015-01-29: qty 1

## 2015-01-29 MED ORDER — ONDANSETRON HCL 4 MG/2ML IJ SOLN: 4.0000 mg | Freq: Once | INTRAMUSCULAR | Status: DC | PRN

## 2015-01-29 MED ORDER — ACETAMINOPHEN 650 MG RE SUPP: 650.0000 mg | Freq: Four times a day (QID) | RECTAL | Status: DC | PRN

## 2015-01-29 MED ORDER — BUPIVACAINE-EPINEPHRINE 0.25% -1:200000 IJ SOLN
INTRAMUSCULAR | Status: DC | PRN
  Administered 2015-01-29: 11 mL

## 2015-01-29 MED ORDER — LIDOCAINE HCL (CARDIAC) 20 MG/ML IV SOLN
INTRAVENOUS | Status: DC | PRN
  Administered 2015-01-29: 70 mg via INTRAVENOUS

## 2015-01-29 MED ORDER — LOSARTAN POTASSIUM 50 MG PO TABS
50.0000 mg | ORAL_TABLET | Freq: Every day | ORAL | Status: DC
  Administered 2015-01-29: 50 mg via ORAL
  Filled 2015-01-29: qty 1

## 2015-01-29 MED ORDER — PHENYLEPHRINE HCL 10 MG/ML IJ SOLN
INTRAMUSCULAR | Status: DC | PRN
  Administered 2015-01-29: 80 ug via INTRAVENOUS
  Administered 2015-01-29: 40 ug via INTRAVENOUS

## 2015-01-29 MED ORDER — ROCURONIUM BROMIDE 50 MG/5ML IV SOLN
INTRAVENOUS | Status: AC
  Filled 2015-01-29: qty 1

## 2015-01-29 MED ORDER — OXYCODONE HCL 5 MG PO TABS
ORAL_TABLET | ORAL | Status: AC
  Filled 2015-01-29: qty 2

## 2015-01-29 SURGICAL SUPPLY — 40 items
APPLIER CLIP 5 13 M/L LIGAMAX5 (MISCELLANEOUS) ×2
APR CLP MED LRG 5 ANG JAW (MISCELLANEOUS) ×1
BAG SPEC RTRVL 10 TROC 200 (ENDOMECHANICALS) ×1
BLADE SURG ROTATE 9660 (MISCELLANEOUS) IMPLANT
CANISTER SUCTION 2500CC (MISCELLANEOUS) ×2 IMPLANT
CHLORAPREP W/TINT 26ML (MISCELLANEOUS) ×2 IMPLANT
CLIP APPLIE 5 13 M/L LIGAMAX5 (MISCELLANEOUS) ×1 IMPLANT
COVER SURGICAL LIGHT HANDLE (MISCELLANEOUS) ×2 IMPLANT
DEVICE TROCAR PUNCTURE CLOSURE (ENDOMECHANICALS) ×1 IMPLANT
ELECT REM PT RETURN 9FT ADLT (ELECTROSURGICAL) ×2
ELECTRODE REM PT RTRN 9FT ADLT (ELECTROSURGICAL) ×1 IMPLANT
GLOVE BIO SURGEON STRL SZ7 (GLOVE) ×3 IMPLANT
GLOVE BIOGEL PI IND STRL 7.0 (GLOVE) IMPLANT
GLOVE BIOGEL PI IND STRL 7.5 (GLOVE) ×1 IMPLANT
GLOVE BIOGEL PI INDICATOR 7.0 (GLOVE) ×1
GLOVE BIOGEL PI INDICATOR 7.5 (GLOVE) ×3
GLOVE ECLIPSE 7.5 STRL STRAW (GLOVE) ×1 IMPLANT
GOWN STRL REUS W/ TWL LRG LVL3 (GOWN DISPOSABLE) ×3 IMPLANT
GOWN STRL REUS W/TWL LRG LVL3 (GOWN DISPOSABLE) ×6
KIT BASIN OR (CUSTOM PROCEDURE TRAY) ×2 IMPLANT
KIT ROOM TURNOVER OR (KITS) ×2 IMPLANT
LIQUID BAND (GAUZE/BANDAGES/DRESSINGS) ×2 IMPLANT
NS IRRIG 1000ML POUR BTL (IV SOLUTION) ×2 IMPLANT
PAD ARMBOARD 7.5X6 YLW CONV (MISCELLANEOUS) ×2 IMPLANT
POUCH RETRIEVAL ECOSAC 10 (ENDOMECHANICALS) ×1 IMPLANT
POUCH RETRIEVAL ECOSAC 10MM (ENDOMECHANICALS) ×1
SCISSORS LAP 5X35 DISP (ENDOMECHANICALS) ×2 IMPLANT
SET IRRIG TUBING LAPAROSCOPIC (IRRIGATION / IRRIGATOR) ×2 IMPLANT
SLEEVE ENDOPATH XCEL 5M (ENDOMECHANICALS) ×4 IMPLANT
SPECIMEN JAR SMALL (MISCELLANEOUS) ×2 IMPLANT
STRIP CLOSURE SKIN 1/2X4 (GAUZE/BANDAGES/DRESSINGS) ×2 IMPLANT
STRIP CLOSURE SKIN 1/4X4 (GAUZE/BANDAGES/DRESSINGS) ×1 IMPLANT
SUT MNCRL AB 4-0 PS2 18 (SUTURE) ×2 IMPLANT
SUT VICRYL 0 UR6 27IN ABS (SUTURE) ×2 IMPLANT
TOWEL OR 17X24 6PK STRL BLUE (TOWEL DISPOSABLE) ×2 IMPLANT
TOWEL OR 17X26 10 PK STRL BLUE (TOWEL DISPOSABLE) ×2 IMPLANT
TRAY LAPAROSCOPIC MC (CUSTOM PROCEDURE TRAY) ×2 IMPLANT
TROCAR XCEL BLUNT TIP 100MML (ENDOMECHANICALS) ×2 IMPLANT
TROCAR XCEL NON-BLD 5MMX100MML (ENDOMECHANICALS) ×2 IMPLANT
TUBING INSUFFLATION (TUBING) ×2 IMPLANT

## 2015-01-29 NOTE — Discharge Instructions (Signed)
[facility] LAPAROSCOPIC SURGERY: POST OP INSTRUCTIONS  Always review your discharge instruction sheet given to you by the facility where your surgery was performed. IF YOU HAVE DISABILITY OR FAMILY LEAVE FORMS, YOU MUST BRING THEM TO THE OFFICE FOR PROCESSING.   DO NOT GIVE THEM TO YOUR DOCTOR.  1. A prescription for pain medication may be given to you upon discharge.  Take your pain medication as prescribed, if needed.  If narcotic pain medicine is not needed, then you may take acetaminophen (Tylenol), naprosyn (Alleve), or ibuprofen (Advil) as needed. 2. Take your usually prescribed medications unless otherwise directed. 3. If you need a refill on your pain medication, please contact your pharmacy.  They will contact our office to request authorization. Prescriptions will not be filled after 5pm or on week-ends. 4. You should follow a light diet the first few days after arrival home, such as soup and crackers, etc.  Be sure to include lots of fluids daily. 5. Most patients will experience some swelling and bruising in the area of the incisions.  Ice packs will help.  Swelling and bruising can take several days to resolve.  6. It is common to experience some constipation if taking pain medication after surgery.  Increasing fluid intake and taking a stool softener (such as Colace) will usually help or prevent this problem from occurring.  A mild laxative (Milk of Magnesia or Miralax) should be taken according to package instructions if there are no bowel movements after 48 hours. 7. Unless discharge instructions indicate otherwise, you may remove your bandages 48 hours after surgery, and you may shower at that time.  You may have steri-strips (small skin tapes) in place directly over the incision.  These strips should be left on the skin for 7-10 days.  If your surgeon used skin glue on the incision, you may shower in 24 hours.  The glue will flake  off over the next 2-3 weeks.  Any sutures or staples will be removed at the office during your follow-up visit. 8. ACTIVITIES:  You may resume regular (light) daily activities beginning the next day--such as daily self-care, walking, climbing stairs--gradually increasing activities as tolerated.  You may have sexual intercourse when it is comfortable.  Refrain from any heavy lifting or straining until approved by your doctor. a. You may drive when you are no longer taking prescription pain medication, you can comfortably wear a seatbelt, and you can safely maneuver your car and apply brakes. b. RETURN TO WORK:  __________________________________________________________ 9. You should see your doctor in the office for a follow-up appointment approximately 2-3 weeks after your surgery.  Make sure that you call for this appointment within a day or two after you arrive home to insure a convenient appointment time. 10. OTHER INSTRUCTIONS: __________________________________________________________________________________________________________________________ __________________________________________________________________________________________________________________________ WHEN TO CALL YOUR DOCTOR: 1. Fever over 101.0 2. Inability to urinate 3. Continued bleeding from incision. 4. Increased pain, redness, or drainage from the incision. 5. Increasing abdominal pain  The clinic staff is available to answer your questions during regular business hours.  Please don't hesitate to call and ask to speak to one of the nurses for clinical concerns.  If you have a medical emergency, go to the nearest emergency room or call 911.  A surgeon from [facility] is always on call at the hospital. [address], [address], [place], [place]  [address] ? [address] [address], [place], [place]   [address] [phone] ? [phone] ? [phone]   [phone] Web site: [email or web address]  

## 2015-01-29 NOTE — Op Note (Signed)
Preoperative diagnosis: gallstones Postoperative diagnosis: same as above Procedure: laparoscopic cholecystectomy  Surgeon: Dr Serita Grammes Anesthesia: general EBL: minimal Drains none Specimen gb and contents to pathology Complications: none Sponge count correct at completion Disposition to recovery stable  Indications: This is an 23 yof with gallstones. She has some atypical symptoms.  We did a ct abdomen that was essentially normal.  We discussed proceeding with lap chole.   Procedure: After informed consent was obtained the patient was taken to the operating room. She was already given antibiotics. Sequential compression devices were on her legs. She was placed under general anesthesia without complication. Her abdomen was prepped and draped in the standard sterile surgical fashion. A surgical timeout was then performed.  I infiltrated marcaine below the umbilicus. I made an incision and then entered the fascia sharply. I then entered the peritoneum bluntly. There was no evidence of an entry injury. I placed a 0 vicryl pursestring suture and inserted a hasson trocar. I then inserted 3 further 5 mm trocars in the epigastrium and ruq. I then was able to retract the gallbladder cephalad and lateral.  Eventually I was able to identify the cystic duct and clearly had the critical view of safety. I then clipped the cystic duct and divided it. The duct was viable and the clips traversed the duct.  I then clipped the cystic artery and divided it.  I then removed the gallbladder from the liver bed and placed it in a bag. It was then removed from the umbilical incision. I then obtained hemostasis and irrigated. I then removed the umbilical trocar and closed with 0 vicryl and the endoclose device after tying down the pursestring.  I then desufflated the abdomen and removed all my remaining trocars. I then closed these with 4-0 Monocryl and Dermabond. She tolerated this well was extubated  and transferred to the recovery room in stable condition

## 2015-01-29 NOTE — H&P (Signed)
81 yof with history for years of abdominal pain that she points to ruq and back radiation from between her shoulder blades that is associated with n/v and diarrhea. this happens now monthly. it is not improving. she is ok in between always in evening. no real relation to any foods. she has some heartburn but no real dysphagia. no weight loss. no blood in stools. she has undergone egd in past with dilation. has colonoscopy in 2004 and this was deferred 81 years ago due to age/pt choice. she has recently been seen by Dr Hilarie Fredrickson and underwent US that shows gallstones. she did have cardiac cath earlier this year for what she points to as chest/epigastric pain that was negative.   Other Problems Elbert Ewings, CMA; 01/21/2015 1:22 PM) Chest pain Gastroesophageal Reflux Disease Oophorectomy  Past Surgical History Elbert Ewings, CMA; 01/21/2015 1:22 PM) Appendectomy Hemorrhoidectomy Knee Surgery Bilateral.  Diagnostic Studies History Elbert Ewings, CMA; 01/21/2015 1:22 PM) Colonoscopy 1-5 years ago  Allergies Elbert Ewings, CMA; 01/21/2015 1:22 PM) No Known Drug Allergies08/16/2016  Medication History Elbert Ewings, CMA; 01/21/2015 1:24 PM) AmLODIPine Besylate (2.5MG  Tablet, Oral) Active. Omeprazole (40MG  Capsule DR, Oral) Active. Tussionex Pennkinetic ER (10-8MG /5ML Liquid ER, Oral as needed) Active. Citracal (950MG  Tablet, Oral) Active. Aspirin (81MG  Tablet DR, Oral) Active. Losartan Potassium (50MG  Tablet, Oral) Active. Medications Reconciled  Social History Elbert Ewings, [place]; 01/21/2015 1:22 PM) Caffeine use Carbonated beverages. Tobacco use Never smoker.  Pregnancy / Birth History Elbert Ewings, [place]; 01/21/2015 1:22 PM) Durenda Age 2  Review of Systems Elbert Ewings CMA; 01/21/2015 1:22 PM) Skin Present- Dryness. Not Present- Change in Wart/Mole, Hives, Jaundice, New Lesions, Non-Healing Wounds, Rash and Ulcer. HEENT Present- Seasonal Allergies and Wears glasses/contact  lenses. Not Present- Earache, Hearing Loss, Hoarseness, Nose Bleed, Oral Ulcers, Ringing in the Ears, Sinus Pain, Sore Throat, Visual Disturbances and Yellow Eyes. Respiratory Present- Chronic Cough. Not Present- Bloody sputum, Difficulty Breathing, Snoring and Wheezing. Cardiovascular Present- Leg Cramps. Not Present- Chest Pain, Difficulty Breathing Lying Down, Palpitations, Rapid Heart Rate, Shortness of Breath and Swelling of Extremities. Gastrointestinal Present- Bloating. Not Present- Abdominal Pain, Bloody Stool, Change in Bowel Habits, Chronic diarrhea, Constipation, Difficulty Swallowing, Excessive gas, Gets full quickly at meals, Hemorrhoids, Indigestion, Nausea, Rectal Pain and Vomiting. Hematology Present- Easy Bruising. Not Present- Excessive bleeding, Gland problems, HIV and Persistent Infections.   Vitals Elbert Ewings CMA; 01/21/2015 1:25 PM) 01/21/2015 1:24 PM Weight: 160 lb Height: 64in Body Surface Area: 1.81 m Body Mass Index: 27.46 kg/m Temp.: 98.46F(Oral)  Pulse: 99 (Regular)  Resp.: 16 (Unlabored)  BP: 130/72 (Sitting, Left Arm, Standard)    Physical Exam Rolm Bookbinder MD; 01/21/2015 1:53 PM) General Mental Status-Alert. Orientation-Oriented X3.  Eye Sclera/Conjunctiva - Bilateral-No scleral icterus.  Chest and Lung Exam Chest and lung exam reveals -on auscultation, normal breath sounds, no adventitious sounds and normal vocal resonance.  Cardiovascular Cardiovascular examination reveals -normal heart sounds, regular rate and rhythm with no murmurs.  Abdomen Note: tender suprapubic and rlq mild, minimal ruq pain no murphys well healed scars bs present   Assessment & Plan Rolm Bookbinder MD; 01/21/2015 1:54 PM) GALLSTONES (574.20  K80.20) Story: I think symptoms mostly likely biliary but exam with pain at old scar and lower. 81 due to age and concern for other source with some nonspecific symptoms will get ct. if negative will  proceed with lap chole. Ct is negative. Will proceed with surgery I discussed the procedure in detail. The patient was given Neurosurgeon. We discussed the risks  and benefits of a laparoscopic cholecystectomy and possible cholangiogram including, but not limited to bleeding, infection, injury to surrounding structures such as the intestine or liver, bile leak, retained gallstones, need to convert to an open procedure, prolonged diarrhea, blood clots such as DVT, common bile duct injury, anesthesia risks, and possible need for additional procedures. The likelihood of improvement in symptoms and return to the patient's normal status is good. We discussed the typical post-operative recovery course

## 2015-01-29 NOTE — Anesthesia Postprocedure Evaluation (Signed)
  Anesthesia Post-op Note  Patient: Deanna Shields  Procedure(s) Performed: Procedure(s) (LRB): LAPAROSCOPIC CHOLECYSTECTOMY (N/A)  Patient Location: PACU  Anesthesia Type: General  Level of Consciousness: awake and alert   Airway and Oxygen Therapy: Patient Spontanous Breathing  Post-op Pain: mild  Post-op Assessment: Post-op Vital signs reviewed, Patient's Cardiovascular Status Stable, Respiratory Function Stable, Patent Airway and No signs of Nausea or vomiting  Last Vitals:  Filed Vitals:   01/29/15 1030  BP: 152/71  Pulse: 81  Temp:   Resp: 12    Post-op Vital Signs: stable   Complications: No apparent anesthesia complications

## 2015-01-29 NOTE — Anesthesia Procedure Notes (Signed)
Procedure Name: Intubation Date/Time: 01/29/2015 8:38 AM Performed by: Ollen Bowl Pre-anesthesia Checklist: Patient identified, Timeout performed, Emergency Drugs available, Suction available and Patient being monitored Patient Re-evaluated:Patient Re-evaluated prior to inductionOxygen Delivery Method: Circle system utilized and Simple face mask Preoxygenation: Pre-oxygenation with 100% oxygen Intubation Type: IV induction Ventilation: Mask ventilation without difficulty Laryngoscope Size: Miller and 2 Grade View: Grade I Tube type: Oral Tube size: 7.5 mm Number of attempts: 1 Airway Equipment and Method: Patient positioned with wedge pillow and Stylet Placement Confirmation: ETT inserted through vocal cords under direct vision,  positive ETCO2 and breath sounds checked- equal and bilateral Secured at: 21 cm Tube secured with: Tape Dental Injury: Teeth and Oropharynx as per pre-operative assessment

## 2015-01-29 NOTE — Anesthesia Preprocedure Evaluation (Addendum)
Anesthesia Evaluation  Patient identified by MRN, date of birth, ID band Patient awake    Reviewed: Allergy & Precautions, NPO status , Patient's Chart, lab work & pertinent test results  History of Anesthesia Complications Negative for: history of anesthetic complications  Airway Mallampati: I  TM Distance: >3 FB Neck ROM: Full    Dental no notable dental hx. (+) Dental Advisory Given   Pulmonary  breath sounds clear to auscultation  Pulmonary exam normal       Cardiovascular hypertension, Pt. on medications + CAD Normal cardiovascular examRhythm:Regular Rate:Normal  Cath revealed: minimal CAD and normal LV function. Dr. [name] believes her chest pain is noncardiac and her Myoview false-positive.    Neuro/Psych PSYCHIATRIC DISORDERS negative neurological ROS  negative psych ROS   GI/Hepatic Neg liver ROS, GERD-  ,  Endo/Other  negative endocrine ROS  Renal/GU negative Renal ROS  negative genitourinary   Musculoskeletal  (+) Arthritis -,   Abdominal   Peds negative pediatric ROS (+)  Hematology negative hematology ROS (+)   Anesthesia Other Findings   Reproductive/Obstetrics negative OB ROS                            Anesthesia Physical Anesthesia Plan  ASA: II  Anesthesia Plan: General   Post-op Pain Management:    Induction: Intravenous  Airway Management Planned: Oral ETT  Additional Equipment:   Intra-op Plan:   Post-operative Plan: Extubation in OR  Informed Consent: I have reviewed the patients History and Physical, chart, labs and discussed the procedure including the risks, benefits and alternatives for the proposed anesthesia with the patient or authorized representative who has indicated his/her understanding and acceptance.   Dental advisory given  Plan Discussed with: CRNA  Anesthesia Plan Comments:         Anesthesia Quick Evaluation

## 2015-01-29 NOTE — Transfer of Care (Signed)
Immediate Anesthesia Transfer of Care Note  Patient: Deanna Shields  Procedure(s) Performed: Procedure(s): LAPAROSCOPIC CHOLECYSTECTOMY (N/A)  Patient Location: PACU  Anesthesia Type:General  Level of Consciousness: awake and alert   Airway & Oxygen Therapy: Patient Spontanous Breathing and Patient connected to nasal cannula oxygen  Post-op Assessment: Report given to RN and Post -op Vital signs reviewed and stable  Post vital signs: Reviewed and stable  Last Vitals:  Filed Vitals:   01/29/15 0726  BP: 140/59  Pulse: 76  Temp: 36.2 C  Resp: 18    Complications: No apparent anesthesia complications

## 2015-01-30 ENCOUNTER — Encounter (HOSPITAL_COMMUNITY): Payer: Self-pay | Admitting: General Surgery

## 2015-01-30 DIAGNOSIS — K8010 Calculus of gallbladder with chronic cholecystitis without obstruction: Secondary | ICD-10-CM | POA: Diagnosis not present

## 2015-01-30 MED ORDER — OXYCODONE HCL 5 MG PO TABS: 5.0000 mg | ORAL_TABLET | Freq: Four times a day (QID) | ORAL | Status: DC | PRN

## 2015-01-30 NOTE — Discharge Summary (Signed)
Physician Discharge Summary  Patient ID: Deanna Shields MRN: [id number] DOB/AGE: 08/31/1928 81 y.o.  Admit date: 01/29/2015 Discharge date: 01/30/2015  Admission Diagnoses: Biliary colic  Discharge Diagnoses:  Active Problems:   S/P laparoscopic cholecystectomy   Discharged Condition: good  Hospital Course: 24 yof admitted after lap chole which was uneventful.  She has done well. She is tolerating diet, voiding, ambulating with good pain control.   Consults: None  Significant Diagnostic Studies: none  Treatments: surgery: laparoscopic cholecystectomy  Discharge Exam: Blood pressure 118/72, pulse 69, temperature 98.9 F (37.2 C), temperature source Oral, resp. rate 18, height 5\' 5"  (1.651 m), weight 81 kg (178 lb 9.2 oz), SpO2 97 %. Soft abdomen with incisions c/d/i  Disposition: 01-Home or Self Care     Medication List    TAKE these medications        amLODipine 2.5 MG tablet  Commonly known as:  NORVASC  Take 1 tablet (2.5 mg total) by mouth daily.     aspirin 81 MG tablet  Take 81 mg by mouth daily.     chlorpheniramine-HYDROcodone 10-8 MG/5ML Suer  Commonly known as:  TUSSIONEX PENNKINETIC ER  Take 5 mLs by mouth every 12 (twelve) hours as needed for cough.     CITRACAL PO  Take 1 tablet by mouth daily.     diphenhydrAMINE 50 MG tablet  Commonly known as:  BENADRYL  Take 50 mg by mouth every 6 (six) hours. For 5 days     EPINEPHrine 0.3 mg/0.3 mL Soaj injection  Commonly known as:  EPI-PEN  Inject 0.3 mLs (0.3 mg total) into the muscle once.     losartan 100 MG tablet  Commonly known as:  COZAAR  TAKE ONE-HALF (1/2) TABLET DAILY     ondansetron 4 MG tablet  Commonly known as:  ZOFRAN  Take 1 tablet (4 mg total) by mouth every 8 (eight) hours as needed for nausea or vomiting (As needed only).     oxyCODONE 5 MG immediate release tablet  Commonly known as:  Oxy IR/ROXICODONE  Take 1 tablet (5 mg total) by mouth every 6 (six) hours as needed for  moderate pain.     predniSONE 20 MG tablet  Commonly known as:  DELTASONE  Take 20 mg by mouth daily. For 8 doses- for Allergic Reaction     ranitidine 150 MG tablet  Commonly known as:  ZANTAC  Take 150 mg by mouth 2 (two) times daily. For 5 days           Follow-up Information    Follow up with [facility], MD In 3 weeks.   Specialty:  General Surgery   Contact information:   [address] [place] [address] [phone]       Signed: Rolm Bookbinder 01/30/2015, 7:17 AM

## 2015-01-30 NOTE — Progress Notes (Signed)
Pt discharged to home.  Discharge instructions explained to pt.  Pt states she has all belongings. Pt state she has no questions at the time of discharge.  IV dc'd.  Volunteer services called to assist pt off floor via wheelchair.

## 2015-01-31 MED ORDER — LOSARTAN POTASSIUM 50 MG PO TABS: 50.0000 mg | ORAL_TABLET | Freq: Every day | ORAL | Status: DC

## 2015-01-31 NOTE — Telephone Encounter (Signed)
Spoke with patient and she agreed to switch to the Losartan 50 mg once daily. She is not needing a refill at this time, so I update the medication w/o sending the script.     KP

## 2015-02-11 ENCOUNTER — Ambulatory Visit
Admission: RE | Admit: 2015-02-11 | Discharge: 2015-02-11 | Disposition: A | Discharge status: Home / Self Care | Payer: Medicare Other | Source: Ambulatory Visit | Attending: General Surgery | Admitting: General Surgery

## 2015-02-11 DIAGNOSIS — R918 Other nonspecific abnormal finding of lung field: Secondary | ICD-10-CM | POA: Diagnosis not present

## 2015-02-13 ENCOUNTER — Encounter: Payer: Self-pay | Admitting: Internal Medicine

## 2015-02-13 ENCOUNTER — Ambulatory Visit (INDEPENDENT_AMBULATORY_CARE_PROVIDER_SITE_OTHER): Payer: Medicare Other | Admitting: Internal Medicine

## 2015-02-13 VITALS — BP 140/70 | HR 64 | Ht 65.0 in | Wt 167.6 lb

## 2015-02-13 DIAGNOSIS — I2510 Atherosclerotic heart disease of native coronary artery without angina pectoris: Secondary | ICD-10-CM | POA: Diagnosis not present

## 2015-02-13 DIAGNOSIS — Z9049 Acquired absence of other specified parts of digestive tract: Secondary | ICD-10-CM | POA: Diagnosis not present

## 2015-02-13 DIAGNOSIS — K8020 Calculus of gallbladder without cholecystitis without obstruction: Secondary | ICD-10-CM

## 2015-02-13 NOTE — Patient Instructions (Signed)
Please follow up with Dr Pyrtle as needed. 

## 2015-02-13 NOTE — Progress Notes (Signed)
Subjective:    Patient ID: Deanna Shields, female    DOB: Aug 20, 1928, 81 y.o.   MRN: [id number]  HPI Deanna Shields is an 81 year old female with a history of GERD, esophageal dysphagia, hiatal hernia, and gallstones status post recent cholecystectomy who is here for follow-up. She was last seen on 12/11/2014 where she was having issues with nausea, and vomiting. We ordered abdominal ultrasound which showed gallstones and she was referred to Dr. Donne Hazel. He saw her, performed CT scan and then cholecystectomy.  unfortunately she had a reaction to the contrast dye for CT. She reports she did very well with surgery. She's been feeling wonderfully. She is so happy with the way she feels. Nausea has resolved. No abdominal pain. No loose stools. No fevers or chills.   Review of Systems As per history of present illness, otherwise negative  Current Medications, Allergies, Past Medical History, Past Surgical History, Family History and Social History were reviewed in [employer] record.     Objective:   Physical Exam BP 140/70 mmHg  Pulse 64  Ht 5\' 5"  (1.651 m)  Wt 167 lb 9.6 oz (76.023 kg)  BMI 27.89 kg/m2 Constitutional: Well-developed and well-nourished. No distress. HEENT: Normocephalic and atraumatic.  Conjunctivae are normal.  No scleral icterus. Neck: Neck supple. Trachea midline. Cardiovascular: Normal rate, regular rhythm and intact distal pulses. No M/R/G Pulmonary/chest: Effort normal and breath sounds normal. No wheezing, rales or rhonchi. Abdominal: Soft, nontender, nondistended. Bowel sounds active throughout.  Extremities: no clubbing, cyanosis, or edema Neurological: Alert and oriented to person place and time. Psychiatric: Normal mood and affect. Behavior is normal.  CBC    Component Value Date/Time   WBC 13.9* 01/28/2015 1037   RBC 4.81 01/28/2015 1037   HGB 15.3* 01/28/2015 1037   HCT 45.5 01/28/2015 1037   PLT 290 01/28/2015 1037   MCV  94.6 01/28/2015 1037   MCH 31.8 01/28/2015 1037   MCHC 33.6 01/28/2015 1037   RDW 13.8 01/28/2015 1037   LYMPHSABS 4.2* 01/28/2015 1037   MONOABS 1.0 01/28/2015 1037   EOSABS 0.3 01/28/2015 1037   BASOSABS 0.0 01/28/2015 1037    CMP     Component Value Date/Time   NA 138 01/28/2015 1037   K 5.0 01/28/2015 1037   CL 100* 01/28/2015 1037   CO2 29 01/28/2015 1037   GLUCOSE 96 01/28/2015 1037   BUN 14 01/28/2015 1037   CREATININE 1.23* 01/28/2015 1037   CREATININE 1.04 09/09/2014 0933   CALCIUM 9.5 01/28/2015 1037   PROT 6.7 01/28/2015 1037   ALBUMIN 3.9 01/28/2015 1037   AST 22 01/28/2015 1037   ALT 18 01/28/2015 1037   ALKPHOS 77 01/28/2015 1037   BILITOT 0.5 01/28/2015 1037   GFRNONAA 39* 01/28/2015 1037   GFRAA 45* 01/28/2015 1037      CT ABDOMEN AND PELVIS WITH CONTRAST   TECHNIQUE: Multidetector CT imaging of the abdomen and pelvis was performed using the standard protocol following bolus administration of intravenous contrast.   CONTRAST:  124mL ISOVUE-300 IOPAMIDOL (ISOVUE-300) INJECTION 61%   COMPARISON:  Ultrasound of the abdomen of 12/18/2014   FINDINGS: There is a 5 mm noncalcified nodule within the posterior lateral left lower lobe on image 6. CT the chest may be warranted to assess for any additional pulmonary nodules. The liver enhances with no focal abnormality and no ductal dilatation is seen. Noncalcified gallstones are noted within the gallbladder, documented by recent ultrasound. The common bile duct is within normal limits  in size measuring 6 mm in diameter distally. No filling defect is seen within the distal common bile duct. The pancreas is normal in size and the pancreatic duct is not dilated. The adrenal glands and spleen are unremarkable. The stomach is moderately fluid distended with no abnormality noted. The kidneys enhance with no calculus or mass and no hydronephrosis is noted. On delayed images, the pelvocaliceal systems are  unremarkable and the proximal ureters are normal in caliber. Moderate atherosclerotic disease of the abdominal aorta is noted. No aneurysm is seen. There is a small periumbilical hernia present containing only fat.   The urinary bladder is moderately urine distended. The uterus appears to have been resected previously. No adnexal lesion is seen. No fluid is noted within the pelvis. The colon is largely decompressed with some feces present. The terminal ileum is unremarkable. The appendix is not definitely seen but no inflammatory process is noted within the right lower quadrant. Fat enters the inguinal canals bilaterally but no bowel enters either canal. Degenerative disc disease is most marked at L4-5.   IMPRESSION: 1. There is a small periumbilical hernia present containing only fat. 2. Fat enters both inguinal canals but no bowel enters either canal. 3. 5 mm noncalcified nodule in the posterolateral left lower lobe. Consider CT of the chest if warranted. 4. Noncalcified gallstones are noted within the gallbladder. 5. Degenerative disc disease at L4-5.     Electronically Signed   By: Ivar Drape M.D.   On: 01/22/2015 14:47 ULTRASOUND ABDOMEN COMPLETE   COMPARISON:  None.   FINDINGS: Gallbladder: Gallstones. Gallbladder wall thickness 1.5 mm. Negative Murphy sign.   Common bile duct: Diameter: 6.9 mm   Liver: No focal lesion identified. Within normal limits in parenchymal echogenicity.   IVC: No abnormality visualized.   Pancreas: Visualized portion unremarkable.   Spleen: Size and appearance within normal limits.   Right Kidney: Length: 9.8 cm. Echogenicity within normal limits. No mass or hydronephrosis visualized.   Left Kidney: Length: 9.9 cm. Echogenicity within normal limits. No mass or hydronephrosis visualized.   Abdominal aorta: No aneurysm visualized.   Other findings: None.   IMPRESSION: Gallstones.  Otherwise negative exam.     Electronically  Signed   By: Marcello Moores  Register   On: 12/18/2014 14:02     Assessment & Plan:  81 year old female with a history of GERD, esophageal dysphagia, hiatal hernia, and gallstones status post recent cholecystectomy who is here for follow-up.  1. Symptomatic gallstones status post cholecystectomy -- she has done very well with resolution of her GI symptoms. She is thrilled. I'm so glad she is feeling better. I asked that she notify me should she develop recurrent abdominal pain, nausea or vomiting. She voices understanding. It does not sound like she is troubled with post cholecystectomy diarrhea. She can follow-up as needed. She has follow-up with Dr. Donne Hazel tomorrow and primary care later this month.

## 2015-02-17 ENCOUNTER — Ambulatory Visit: Payer: Medicare Other | Admitting: Family Medicine

## 2015-02-25 ENCOUNTER — Ambulatory Visit (INDEPENDENT_AMBULATORY_CARE_PROVIDER_SITE_OTHER): Payer: Medicare Other | Admitting: Family Medicine

## 2015-02-25 ENCOUNTER — Encounter: Payer: Self-pay | Admitting: Family Medicine

## 2015-02-25 VITALS — BP 124/70 | HR 93 | Temp 99.0°F | Wt 172.8 lb

## 2015-02-25 DIAGNOSIS — I10 Essential (primary) hypertension: Secondary | ICD-10-CM

## 2015-02-25 DIAGNOSIS — I2510 Atherosclerotic heart disease of native coronary artery without angina pectoris: Secondary | ICD-10-CM

## 2015-02-25 DIAGNOSIS — Z23 Encounter for immunization: Secondary | ICD-10-CM

## 2015-02-25 NOTE — Patient Instructions (Signed)

## 2015-02-25 NOTE — Progress Notes (Signed)
Pre visit review using our clinic review tool, if applicable. No additional management support is needed unless otherwise documented below in the visit note. 

## 2015-02-27 ENCOUNTER — Encounter: Payer: Medicare Other | Admitting: Family Medicine

## 2015-03-11 ENCOUNTER — Ambulatory Visit (INDEPENDENT_AMBULATORY_CARE_PROVIDER_SITE_OTHER): Payer: Medicare Other

## 2015-03-11 DIAGNOSIS — Z23 Encounter for immunization: Secondary | ICD-10-CM

## 2015-03-18 ENCOUNTER — Telehealth: Payer: Self-pay | Admitting: Family Medicine

## 2015-03-18 MED ORDER — HYDROCOD POLST-CPM POLST ER 10-8 MG/5ML PO SUER: 5.0000 mL | Freq: Two times a day (BID) | ORAL | Status: DC | PRN

## 2015-03-18 NOTE — Telephone Encounter (Signed)
Last seen 02/25/15 and Tussionex filled 01/06/15 #140 ml   Please advise     KP

## 2015-03-18 NOTE — Telephone Encounter (Signed)
VM left advising Rx ready for pick up.     KP 

## 2015-03-18 NOTE — Telephone Encounter (Signed)
Relation to [id number] Call back number: [phone] Pharmacy: [facility], [place]  Reason for call:  Patient requesting a refill amLODipine (NORVASC) 2.5 MG tablet and chlorpheniramine-HYDROcodone (TUSSIONEX PENNKINETIC ER) 10-8 MG/5ML SUER

## 2015-03-18 NOTE — Telephone Encounter (Signed)
Refill x1 

## 2015-03-24 ENCOUNTER — Other Ambulatory Visit: Payer: Self-pay | Admitting: *Deleted

## 2015-03-24 ENCOUNTER — Telehealth: Payer: Self-pay | Admitting: Cardiovascular Disease

## 2015-03-24 NOTE — Telephone Encounter (Signed)
Per pt [facility] did not get RX for amlodipine. Please send in for her.

## 2015-03-24 NOTE — Telephone Encounter (Signed)
Spoke with patient and I made he aware that Dr.Berry fills this Rx and it was sent for 1 year in May, I advised her to call and have [facility] that Rx for her and she verbalized understanding. She has agreed to do so.     KP

## 2015-03-24 NOTE — Telephone Encounter (Signed)
I noticed that an rx for amlodipine 2.5mg  and 5mg  was sent in on 10/22/14. Just wanted to clarify which dose patient should be taking. She stated that she has been taking 2.5mg  qd. Please advise. Thanks, MI

## 2015-03-24 NOTE — Telephone Encounter (Signed)
°  STAT if patient is at the pharmacy , call can be transferred to refill team.   1. Which medications need to be refilled? Amlodipine 2.5 mg 2. Which pharmacy/location is medication to be sent to? [facility]  3. Do they need a 30 day or 90 day supply? 90 and refills

## 2015-03-24 NOTE — Telephone Encounter (Signed)
Spoke with patient as per epic, she has refills. There seems to have been a problem on [facility]. Patient is aware that I will re-send.

## 2015-03-25 ENCOUNTER — Other Ambulatory Visit: Payer: Self-pay | Admitting: *Deleted

## 2015-03-25 MED ORDER — AMLODIPINE BESYLATE 2.5 MG PO TABS: 2.5000 mg | ORAL_TABLET | Freq: Every day | ORAL | Status: DC

## 2015-03-25 NOTE — Telephone Encounter (Signed)
[name] - go ahead and refill the amlodipine 2.5.  Most BP readings are WNL or close to.  Pt has appt with Dr. [name] in November, we can increase dose then if needed.  Thanks.

## 2015-03-25 NOTE — Telephone Encounter (Signed)
Please refer to Cyril Mourning to sort out and decided status is appropriate

## 2015-04-22 ENCOUNTER — Ambulatory Visit: Payer: Medicare Other | Admitting: Cardiovascular Disease

## 2015-06-17 ENCOUNTER — Telehealth: Payer: Self-pay | Admitting: Family Medicine

## 2015-06-17 MED ORDER — HYDROCOD POLST-CPM POLST ER 10-8 MG/5ML PO SUER: 5.0000 mL | Freq: Two times a day (BID) | ORAL | Status: DC | PRN

## 2015-06-17 NOTE — Telephone Encounter (Signed)
Last seen 02/25/15 and filled 03/18/15   Please advise    KP

## 2015-06-17 NOTE — Telephone Encounter (Signed)
Pharmacy: [facility]  Reason for call: Pt requesting refill on tussinex for chronic cough. She said it is refilled without her coming in.

## 2015-06-17 NOTE — Telephone Encounter (Signed)
Refill x1 

## 2015-06-17 NOTE — Telephone Encounter (Signed)
Patient aware Rx ready for pick up.      KP 

## 2015-06-19 MED FILL — HYDROCODONE-CHLORPHENIRAM S: 10-8 | 14 days supply | Qty: 140 | Fill #0

## 2015-06-30 ENCOUNTER — Other Ambulatory Visit: Payer: Self-pay | Admitting: Family Medicine

## 2015-06-30 MED ORDER — LOSARTAN POTASSIUM 50 MG PO TABS: 50.0000 mg | ORAL_TABLET | Freq: Every day | ORAL | Status: DC

## 2015-07-16 DIAGNOSIS — H25813 Combined forms of age-related cataract, bilateral: Secondary | ICD-10-CM | POA: Diagnosis not present

## 2015-07-16 DIAGNOSIS — H35373 Puckering of macula, bilateral: Secondary | ICD-10-CM | POA: Diagnosis not present

## 2015-07-16 DIAGNOSIS — H04123 Dry eye syndrome of bilateral lacrimal glands: Secondary | ICD-10-CM | POA: Diagnosis not present

## 2015-07-22 ENCOUNTER — Ambulatory Visit: Payer: Medicare Other | Admitting: Cardiovascular Disease

## 2015-07-23 ENCOUNTER — Ambulatory Visit: Payer: Medicare Other | Admitting: Cardiovascular Disease

## 2015-08-22 ENCOUNTER — Encounter: Payer: Self-pay | Admitting: *Deleted

## 2015-08-22 ENCOUNTER — Telehealth: Payer: Self-pay | Admitting: *Deleted

## 2015-08-22 NOTE — Telephone Encounter (Signed)
Pre-Visit Call completed with patient and chart updated.   Pre-Visit Info documented in Specialty Comments under [employer].    

## 2015-08-25 ENCOUNTER — Ambulatory Visit (HOSPITAL_BASED_OUTPATIENT_CLINIC_OR_DEPARTMENT_OTHER)
Admission: RE | Admit: 2015-08-25 | Discharge: 2015-08-25 | Disposition: A | Discharge status: Home / Self Care | Payer: Medicare Other | Source: Ambulatory Visit | Attending: Family Medicine | Admitting: Family Medicine

## 2015-08-25 ENCOUNTER — Encounter: Payer: Self-pay | Admitting: Family Medicine

## 2015-08-25 ENCOUNTER — Encounter: Payer: Medicare Other | Admitting: Family Medicine

## 2015-08-25 ENCOUNTER — Ambulatory Visit (INDEPENDENT_AMBULATORY_CARE_PROVIDER_SITE_OTHER): Payer: Medicare Other | Admitting: Family Medicine

## 2015-08-25 VITALS — BP 126/70 | HR 69 | Temp 97.7°F | Ht 64.0 in | Wt 178.0 lb

## 2015-08-25 DIAGNOSIS — I10 Essential (primary) hypertension: Secondary | ICD-10-CM

## 2015-08-25 DIAGNOSIS — Z0000 Encounter for general adult medical examination without abnormal findings: Secondary | ICD-10-CM

## 2015-08-25 DIAGNOSIS — M47896 Other spondylosis, lumbar region: Secondary | ICD-10-CM | POA: Insufficient documentation

## 2015-08-25 DIAGNOSIS — Z0001 Encounter for general adult medical examination with abnormal findings: Secondary | ICD-10-CM

## 2015-08-25 DIAGNOSIS — M545 Low back pain: Secondary | ICD-10-CM | POA: Diagnosis present

## 2015-08-25 DIAGNOSIS — H6121 Impacted cerumen, right ear: Secondary | ICD-10-CM

## 2015-08-25 DIAGNOSIS — M5441 Lumbago with sciatica, right side: Secondary | ICD-10-CM | POA: Diagnosis not present

## 2015-08-25 DIAGNOSIS — H9193 Unspecified hearing loss, bilateral: Secondary | ICD-10-CM | POA: Diagnosis not present

## 2015-08-25 DIAGNOSIS — R05 Cough: Secondary | ICD-10-CM

## 2015-08-25 DIAGNOSIS — R059 Cough, unspecified: Secondary | ICD-10-CM

## 2015-08-25 LAB — POCT URINALYSIS DIPSTICK
BILIRUBIN UA: NEGATIVE
Blood, UA: NEGATIVE
GLUCOSE UA: NEGATIVE
KETONES UA: NEGATIVE
LEUKOCYTES UA: NEGATIVE
NITRITE UA: NEGATIVE
Protein, UA: NEGATIVE
Spec Grav, UA: >=1.03
Urobilinogen, UA: 1
pH, UA: 6

## 2015-08-25 LAB — CBC WITH DIFFERENTIAL/PLATELET
BASOS ABS: 0.1 10*3/uL (ref 0.0–0.1)
Basophils Relative: 0.9 % (ref 0.0–3.0)
EOS ABS: 0.3 10*3/uL (ref 0.0–0.7)
Eosinophils Relative: 3.3 % (ref 0.0–5.0)
HCT: 40.3 % (ref 36.0–46.0)
Hemoglobin: 13.5 g/dL (ref 12.0–15.0)
LYMPHS ABS: 2.3 10*3/uL (ref 0.7–4.0)
LYMPHS PCT: 26.8 % (ref 12.0–46.0)
MCHC: 33.4 g/dL (ref 30.0–36.0)
MCV: 92.3 fl (ref 78.0–100.0)
MONOS PCT: 5.7 % (ref 3.0–12.0)
Monocytes Absolute: 0.5 10*3/uL (ref 0.1–1.0)
NEUTROS ABS: 5.3 10*3/uL (ref 1.4–7.7)
NEUTROS PCT: 63.3 % (ref 43.0–77.0)
PLATELETS: 266 10*3/uL (ref 150.0–400.0)
RBC: 4.37 Mil/uL (ref 3.87–5.11)
RDW: 14.4 % (ref 11.5–15.5)
WBC: 8.4 10*3/uL (ref 4.0–10.5)

## 2015-08-25 LAB — LIPID PANEL
Cholesterol: 170 mg/dL (ref 0–200)
HDL: 56 mg/dL (ref >39.00)
LDL Cholesterol: 93 mg/dL (ref 0–99)
NONHDL: 114.1
TRIGLYCERIDES: 108 mg/dL (ref 0.0–149.0)
Total CHOL/HDL Ratio: 3
VLDL: 21.6 mg/dL (ref 0.0–40.0)

## 2015-08-25 LAB — COMPREHENSIVE METABOLIC PANEL
ALBUMIN: 4.4 g/dL (ref 3.5–5.2)
ALK PHOS: 140 U/L — AB (ref 39–117)
ALT: 53 U/L — AB (ref 0–35)
AST: 47 U/L — AB (ref 0–37)
BILIRUBIN TOTAL: 0.7 mg/dL (ref 0.2–1.2)
BUN: 21 mg/dL (ref 6–23)
CALCIUM: 10.1 mg/dL (ref 8.4–10.5)
CO2: 30 meq/L (ref 19–32)
CREATININE: 1.14 mg/dL (ref 0.40–1.20)
Chloride: 104 mEq/L (ref 96–112)
GFR: 48.16 mL/min — AB (ref >60.00)
Glucose, Bld: 101 mg/dL — ABNORMAL HIGH (ref 70–99)
Potassium: 5 mEq/L (ref 3.5–5.1)
Sodium: 140 mEq/L (ref 135–145)
TOTAL PROTEIN: 7.2 g/dL (ref 6.0–8.3)

## 2015-08-25 MED ORDER — HYDROCOD POLST-CPM POLST ER 10-8 MG/5ML PO SUER: 5.0000 mL | Freq: Two times a day (BID) | ORAL | Status: DC | PRN

## 2015-08-25 MED FILL — HYDROCODONE-CHLORPHENIRAM S: 10-8 | 14 days supply | Qty: 140 | Fill #0

## 2015-08-25 NOTE — Progress Notes (Signed)
Pre visit review using our clinic review tool, if applicable. No additional management support is needed unless otherwise documented below in the visit note. 

## 2015-08-25 NOTE — Progress Notes (Addendum)
+  Subjective:   Deanna Shields is a 82 y.o. female who presents for Medicare Annual (Subsequent) preventive examination.  She is c/o R sided back pain with radiation down to R calf x 3 months--- no known injury.   She first noticed it when she rolled over in bed one morning and it has progressively gotten worse.   She also c/o dec hearing esp in R ear.   Review of Systems:   Review of Systems  Constitutional: Negative for activity change, appetite change and fatigue.  HENT: Negative for, congestion, tinnitus and ear discharge.  + hearing loss Eyes: Negative for visual disturbance (see optho q1y -- vision corrected to 20/20 with glasses).  Respiratory: Negative for , chest tightness and shortness of breath.  + chronic cough Cardiovascular: Negative for chest pain, palpitations and leg swelling.  Gastrointestinal: Negative for abdominal pain, diarrhea, constipation and abdominal distention.  Genitourinary: Negative for urgency, frequency, decreased urine volume and difficulty urinating.  Musculoskeletal: Negative for, arthralgias and gait problem.  + back pain Skin: Negative for color change, pallor and rash.  Neurological: Negative for dizziness, light-headedness, numbness and headaches.  Hematological: Negative for adenopathy. Does not bruise/bleed easily.  Psychiatric/Behavioral: Negative for suicidal ideas, confusion, sleep disturbance, self-injury, dysphoric mood, decreased concentration and agitation.  Pt is able to read and write and can do all ADLs No risk for falling No abuse/ violence in home          Objective:     Vitals: BP 126/70 mmHg  Pulse 69  Temp(Src) 97.7 F (36.5 C) (Oral)  Ht _0  (1.626 m)  Wt 178 lb (80.74 kg)  BMI 30.54 kg/m2  SpO2 96%  Body mass index is 30.54 kg/(m^2). BP 126/70 mmHg  Pulse 69  Temp(Src) 97.7 F (36.5 C) (Oral)  Ht _1  (1.626 m)  Wt 178 lb (80.74 kg)  BMI 30.54 kg/m2  SpO2 96% General appearance: alert, cooperative,  appears stated age and no distress Head: Normocephalic, without obvious abnormality, atraumatic Eyes: negative findings: lids and lashes normal and pupils equal, round, reactive to light and accomodation Ears: R ear + cerumen impaction-- unable to use hoop-- irrigated successfully,  Tm Intact, no errythema Nose: Nares normal. Septum midline. Mucosa normal. No drainage or sinus tenderness. Throat: lips, mucosa, and tongue normal; teeth and gums normal Neck: no adenopathy, no carotid bruit, no JVD, supple, symmetrical, trachea midline and thyroid not enlarged, symmetric, no tenderness/mass/nodules Back: symmetric, no curvature. ROM normal. No CVA tenderness. Lungs: clear to auscultation bilaterally Breasts: normal appearance, no masses or tenderness Heart: regular rate and rhythm, S1, S2 normal, no murmur, click, rub or gallop Abdomen: soft, non-tender; bowel sounds normal; no masses,  no organomegaly Pelvic: not indicated; status post hysterectomy, negative ROS Extremities: extremities normal, atraumatic, no cyanosis or edema Pulses: 2+ and symmetric Skin: Skin color, texture, turgor normal. No rashes or lesions Lymph nodes: Cervical, supraclavicular, and axillary nodes normal. Neurologic: Alert and oriented X 3, normal strength and tone. Normal symmetric reflexes. Normal coordination and gait Psych- no depression, no anxiety  Tobacco History  Smoking status  . Never Smoker   Smokeless tobacco  . Never Used     Counseling given: Not Answered   Past Medical History  Diagnosis Date  . Hypertension   . Rhinitis, allergic   . Hemorrhoids 2004  . Hiatal hernia 2004  . Esophageal stricture 2009  . GERD (gastroesophageal reflux disease) 2004  . Dizziness   . Syncopal episodes   .  Gallstones   . HOH (hard of hearing)   . History of blood transfusion 1947    "appendix ruptured"  . Arthritis     "right thumb" (01/29/2015)  . Chronic shoulder pain     "between my shoulders"  (01/29/2015)   Past Surgical History  Procedure Laterality Date  . Vaginal hysterectomy  1970's  . Hemorroidectomy    . Knee arthroscopy Bilateral     right x 2  . Carpal tunnel release Right 1990's  . Nm myoview ltd  06/01/2012    EF 71%  . Left heart catheterization with coronary angiogram N/A 09/16/2014    Procedure: LEFT HEART CATHETERIZATION WITH CORONARY ANGIOGRAM;  Surgeon: [name], MD;  Location[facility] C[facility] LAB;  Service: Cardiovascular;  Laterality: N/A;  . Appendectomy  1947    "ruptured"  . Laparoscopic cholecystectomy  01/29/2015  . Dilation and curettage of uterus  "couple"  . Cardiac catheterization  10/23/1999; 09/2014    EF 60% No significant CAD; patent coronary arteries.  . Esophagogastroduodenoscopy (egd) with esophageal dilation  2-3 times  . Cholecystectomy N/A 01/29/2015    Procedure: LAPAROSCOPIC CHOLECYSTECTOMY;  Surgeon: Rolm Bookbinder, MD;  Location: [facility] OR;  Service: General;  Laterality: N/A;   Family History  Problem Relation Age of Onset  . Stroke Father 72    light stroke  . Heart disease Sister   . Coronary artery disease Neg Hx   . Diabetes Neg Hx   . Cancer Neg Hx     breast, colon, prostate   History  Sexual Activity  . Sexual Activity: No    Outpatient Encounter Prescriptions as of 08/25/2015  Medication Sig  . amLODipine (NORVASC) 2.5 MG tablet Take 1 tablet (2.5 mg total) by mouth daily.  [name] aspirin 81 MG tablet Take 81 mg by mouth daily.   . Calcium Citrate (CITRACAL PO) Take 1 tablet by mouth daily.  . chlorpheniramine-HYDROcodone (TUSSIONEX PENNKINETIC ER) 10-8 MG/5ML SUER Take 5 mLs by mouth every 12 (twelve) hours as needed for cough.  . losartan (COZAAR) 50 MG tablet Take 1 tablet (50 mg total) by mouth daily.  . [DISCONTINUED] chlorpheniramine-HYDROcodone (TUSSIONEX PENNKINETIC ER) 10-8 MG/5ML SUER Take 5 mLs by mouth every 12 (twelve) hours as needed for cough.  . [DISCONTINUED] ondansetron (ZOFRAN) 4 MG tablet Take 1 tablet  (4 mg total) by mouth every 8 (eight) hours as needed for nausea or vomiting (As needed only). (Patient not taking: Reported on 02/25/2015)  . [DISCONTINUED] oxyCODONE (OXY IR/ROXICODONE) 5 MG immediate release tablet Take 1 tablet (5 mg total) by mouth every 6 (six) hours as needed for moderate pain. (Patient not taking: Reported on 02/25/2015)   No facility-administered encounter medications on file as of 08/25/2015.    Activities of Daily Living In your present state of health, do you have any difficulty performing the following activities: 01/29/2015 01/29/2015  Hearing? - Y  Vision? - N  Difficulty concentrating or making decisions? - N  Walking or climbing stairs? - N  Dressing or bathing? - N  Doing errands, shopping? N -    Patient Care Team: Rosalita Chessman, DO as PCP - General [name], MD as Consulting Physician (Cardiology)    Assessment:    cpe Exercise Activities and Dietary recommendations---has not been able to exercise because of her husbands illeness     Goals    None     Fall Risk Fall Risk  08/25/2015 08/19/2014 07/18/2013  Falls in the past year?  No No No   Depression Screen PHQ 2/9 Scores 08/25/2015 08/19/2014 07/18/2013 09/15/2012  PHQ - 2 Score 1 0 0 0     Cognitive Testin mmse 3030  Immunization History  Administered Date(s) Administered  . Influenza Split 03/16/2011, 03/01/2012  . Influenza Whole 03/28/2007, 04/01/2009, 02/18/2010  . Influenza, High Dose Seasonal PF 03/14/2013  . Influenza,inj,Quad PF,36+ Mos 03/20/2014, 03/11/2015  . Pneumococcal Conjugate-13 02/25/2015  . Pneumococcal Polysaccharide-23 03/28/2007  . Td 12/20/2005   Screening Tests Health Maintenance  Topic Date Due  . MAMMOGRAM  08/24/2016 (Originally 09/15/2013)  . TETANUS/TDAP  12/21/2015  . INFLUENZA VACCINE  01/06/2016  . DEXA SCAN  Completed  . ZOSTAVAX  Addressed  . PNA vac Low Risk Adult  Completed      Plan:    see AVS During the course of the visit the  patient was educated and counseled about the following appropriate screening and preventive services:   Vaccines to include Pneumoccal, Influenza, Hepatitis B, Td, Zostavax, HCV  Electrocardiogram  Cardiovascular Disease  Colorectal cancer screening  Bone density screening  Diabetes screening  Glaucoma screening  Mammography/PAP  Nutrition counseling   Patient Instructions (the written plan) was given to the patient.  1. Cough   - chlorpheniramine-HYDROcodone (TUSSIONEX PENNKINETIC ER) 10-8 MG/5ML SUER; Take 5 mLs by mouth every 12 (twelve) hours as needed for cough.  Dispense: 140 mL; Refill: 0  2. Preventative health care See above  3. Essential hypertension con't meds - CBC with Differential/Platelet - Lipid panel - POCT urinalysis dipstick - Comp Met (CMET)  4. Right-sided low back pain with right-sided sciatica   - DG Lumbar Spine Complete; Future  5. Hearing loss, bilateral R cerumen imaction-- irrigated successfully but still hard of hearing  - Ambulatory referral to Audiology  6. Routine history and physical examination of adult    Garnet Koyanagi, DO  08/25/2015

## 2015-08-25 NOTE — Patient Instructions (Signed)
Preventive Care for Adults, Female A healthy lifestyle and preventive care can promote health and wellness. Preventive health guidelines for women include the following key practices.  A routine yearly physical is a good way to check with your health care provider about your health and preventive screening. It is a chance to share any concerns and updates on your health and to receive a thorough exam.  Visit your dentist for a routine exam and preventive care every 6 months. Brush your teeth twice a day and floss once a day. Good oral hygiene prevents tooth decay and gum disease.  The frequency of eye exams is based on your age, health, family medical history, use of contact lenses, and other factors. Follow your health care provider's recommendations for frequency of eye exams.  Eat a healthy diet. Foods like vegetables, fruits, whole grains, low-fat dairy products, and lean protein foods contain the nutrients you need without too many calories. Decrease your intake of foods high in solid fats, added sugars, and salt. Eat the right amount of calories for you.Get information about a proper diet from your health care provider, if necessary.  Regular physical exercise is one of the most important things you can do for your health. Most adults should get at least 150 minutes of moderate-intensity exercise (any activity that increases your heart rate and causes you to sweat) each week. In addition, most adults need muscle-strengthening exercises on 2 or more days a week.  Maintain a healthy weight. The body mass index (BMI) is a screening tool to identify possible weight problems. It provides an estimate of body fat based on height and weight. Your health care provider can find your BMI and can help you achieve or maintain a healthy weight.For adults 20 years and older:  A BMI below 18.5 is considered underweight.  A BMI of 18.5 to 24.9 is normal.  A BMI of 25 to 29.9 is considered overweight.  A  BMI of 30 and above is considered obese.  Maintain normal blood lipids and cholesterol levels by exercising and minimizing your intake of saturated fat. Eat a balanced diet with plenty of fruit and vegetables. Blood tests for lipids and cholesterol should begin at age 45 and be repeated every 5 years. If your lipid or cholesterol levels are high, you are over 50, or you are at high risk for heart disease, you may need your cholesterol levels checked more frequently.Ongoing high lipid and cholesterol levels should be treated with medicines if diet and exercise are not working.  If you smoke, find out from your health care provider how to quit. If you do not use tobacco, do not start.  Lung cancer screening is recommended for adults aged 45-80 years who are at high risk for developing lung cancer because of a history of smoking. A yearly low-dose CT scan of the lungs is recommended for people who have at least a 30-pack-year history of smoking and are a current smoker or have quit within the past 15 years. A pack year of smoking is smoking an average of 1 pack of cigarettes a day for 1 year (for example: 1 pack a day for 30 years or 2 packs a day for 15 years). Yearly screening should continue until the smoker has stopped smoking for at least 15 years. Yearly screening should be stopped for people who develop a health problem that would prevent them from having lung cancer treatment.  If you are pregnant, do not drink alcohol. If you are  breastfeeding, be very cautious about drinking alcohol. If you are not pregnant and choose to drink alcohol, do not have more than 1 drink per day. One drink is considered to be 12 ounces (355 mL) of beer, 5 ounces (148 mL) of wine, or 1.5 ounces (44 mL) of liquor.  Avoid use of street drugs. Do not share needles with anyone. Ask for help if you need support or instructions about stopping the use of drugs.  High blood pressure causes heart disease and increases the risk  of stroke. Your blood pressure should be checked at least every 1 to 2 years. Ongoing high blood pressure should be treated with medicines if weight loss and exercise do not work.  If you are 55-79 years old, ask your health care provider if you should take aspirin to prevent strokes.  Diabetes screening is done by taking a blood sample to check your blood glucose level after you have not eaten for a certain period of time (fasting). If you are not overweight and you do not have risk factors for diabetes, you should be screened once every 3 years starting at age 45. If you are overweight or obese and you are 40-70 years of age, you should be screened for diabetes every year as part of your cardiovascular risk assessment.  Breast cancer screening is essential preventive care for women. You should practice "breast self-awareness." This means understanding the normal appearance and feel of your breasts and may include breast self-examination. Any changes detected, no matter how small, should be reported to a health care provider. Women in their 20s and 30s should have a clinical breast exam (CBE) by a health care provider as part of a regular health exam every 1 to 3 years. After age 40, women should have a CBE every year. Starting at age 40, women should consider having a mammogram (breast X-ray test) every year. Women who have a family history of breast cancer should talk to their health care provider about genetic screening. Women at a high risk of breast cancer should talk to their health care providers about having an MRI and a mammogram every year.  Breast cancer gene (BRCA)-related cancer risk assessment is recommended for women who have family members with BRCA-related cancers. BRCA-related cancers include breast, ovarian, tubal, and peritoneal cancers. Having family members with these cancers may be associated with an increased risk for harmful changes (mutations) in the breast cancer genes BRCA1 and  BRCA2. Results of the assessment will determine the need for genetic counseling and BRCA1 and BRCA2 testing.  Your health care provider may recommend that you be screened regularly for cancer of the pelvic organs (ovaries, uterus, and vagina). This screening involves a pelvic examination, including checking for microscopic changes to the surface of your cervix (Pap test). You may be encouraged to have this screening done every 3 years, beginning at age 21.  For women ages 30-65, health care providers may recommend pelvic exams and Pap testing every 3 years, or they may recommend the Pap and pelvic exam, combined with testing for human papilloma virus (HPV), every 5 years. Some types of HPV increase your risk of cervical cancer. Testing for HPV may also be done on women of any age with unclear Pap test results.  Other health care providers may not recommend any screening for nonpregnant women who are considered low risk for pelvic cancer and who do not have symptoms. Ask your health care provider if a screening pelvic exam is right for   you.  If you have had past treatment for cervical cancer or a condition that could lead to cancer, you need Pap tests and screening for cancer for at least 20 years after your treatment. If Pap tests have been discontinued, your risk factors (such as having a new sexual partner) need to be reassessed to determine if screening should resume. Some women have medical problems that increase the chance of getting cervical cancer. In these cases, your health care provider may recommend more frequent screening and Pap tests.  Colorectal cancer can be detected and often prevented. Most routine colorectal cancer screening begins at the age of 50 years and continues through age 75 years. However, your health care provider may recommend screening at an earlier age if you have risk factors for colon cancer. On a yearly basis, your health care provider may provide home test kits to check  for hidden blood in the stool. Use of a small camera at the end of a tube, to directly examine the colon (sigmoidoscopy or colonoscopy), can detect the earliest forms of colorectal cancer. Talk to your health care provider about this at age 50, when routine screening begins. Direct exam of the colon should be repeated every 5-10 years through age 75 years, unless early forms of precancerous polyps or small growths are found.  People who are at an increased risk for hepatitis B should be screened for this virus. You are considered at high risk for hepatitis B if:  You were born in a country where hepatitis B occurs often. Talk with your health care provider about which countries are considered high risk.  Your parents were born in a high-risk country and you have not received a shot to protect against hepatitis B (hepatitis B vaccine).  You have HIV or AIDS.  You use needles to inject street drugs.  You live with, or have sex with, someone who has hepatitis B.  You get hemodialysis treatment.  You take certain medicines for conditions like cancer, organ transplantation, and autoimmune conditions.  Hepatitis C blood testing is recommended for all people born from 1945 through 1965 and any individual with known risks for hepatitis C.  Practice safe sex. Use condoms and avoid high-risk sexual practices to reduce the spread of sexually transmitted infections (STIs). STIs include gonorrhea, chlamydia, syphilis, trichomonas, herpes, HPV, and human immunodeficiency virus (HIV). Herpes, HIV, and HPV are viral illnesses that have no cure. They can result in disability, cancer, and death.  You should be screened for sexually transmitted illnesses (STIs) including gonorrhea and chlamydia if:  You are sexually active and are younger than 24 years.  You are older than 24 years and your health care provider tells you that you are at risk for this type of infection.  Your sexual activity has changed  since you were last screened and you are at an increased risk for chlamydia or gonorrhea. Ask your health care provider if you are at risk.  If you are at risk of being infected with HIV, it is recommended that you take a prescription medicine daily to prevent HIV infection. This is called preexposure prophylaxis (PrEP). You are considered at risk if:  You are sexually active and do not regularly use condoms or know the HIV status of your partner(s).  You take drugs by injection.  You are sexually active with a partner who has HIV.  Talk with your health care provider about whether you are at high risk of being infected with HIV. If   you choose to begin PrEP, you should first be tested for HIV. You should then be tested every 3 months for as long as you are taking PrEP.  Osteoporosis is a disease in which the bones lose minerals and strength with aging. This can result in serious bone fractures or breaks. The risk of osteoporosis can be identified using a bone density scan. Women ages 67 years and over and women at risk for fractures or osteoporosis should discuss screening with their health care providers. Ask your health care provider whether you should take a calcium supplement or vitamin D to reduce the rate of osteoporosis.  Menopause can be associated with physical symptoms and risks. Hormone replacement therapy is available to decrease symptoms and risks. You should talk to your health care provider about whether hormone replacement therapy is right for you.  Use sunscreen. Apply sunscreen liberally and repeatedly throughout the day. You should seek shade when your shadow is shorter than you. Protect yourself by wearing long sleeves, pants, a wide-brimmed hat, and sunglasses year round, whenever you are outdoors.  Once a month, do a whole body skin exam, using a mirror to look at the skin on your back. Tell your health care provider of new moles, moles that have irregular borders, moles that  are larger than a pencil eraser, or moles that have changed in shape or color.  Stay current with required vaccines (immunizations).  Influenza vaccine. All adults should be immunized every year.  Tetanus, diphtheria, and acellular pertussis (Td, Tdap) vaccine. Pregnant women should receive 1 dose of Tdap vaccine during each pregnancy. The dose should be obtained regardless of the length of time since the last dose. Immunization is preferred during the 27th-36th week of gestation. An adult who has not previously received Tdap or who does not know her vaccine status should receive 1 dose of Tdap. This initial dose should be followed by tetanus and diphtheria toxoids (Td) booster doses every 10 years. Adults with an unknown or incomplete history of completing a 3-dose immunization series with Td-containing vaccines should begin or complete a primary immunization series including a Tdap dose. Adults should receive a Td booster every 10 years.  Varicella vaccine. An adult without evidence of immunity to varicella should receive 2 doses or a second dose if she has previously received 1 dose. Pregnant females who do not have evidence of immunity should receive the first dose after pregnancy. This first dose should be obtained before leaving the health care facility. The second dose should be obtained 4-8 weeks after the first dose.  Human papillomavirus (HPV) vaccine. Females aged 13-26 years who have not received the vaccine previously should obtain the 3-dose series. The vaccine is not recommended for use in pregnant females. However, pregnancy testing is not needed before receiving a dose. If a female is found to be pregnant after receiving a dose, no treatment is needed. In that case, the remaining doses should be delayed until after the pregnancy. Immunization is recommended for any person with an immunocompromised condition through the age of 61 years if she did not get any or all doses earlier. During the  3-dose series, the second dose should be obtained 4-8 weeks after the first dose. The third dose should be obtained 24 weeks after the first dose and 16 weeks after the second dose.  Zoster vaccine. One dose is recommended for adults aged 30 years or older unless certain conditions are present.  Measles, mumps, and rubella (MMR) vaccine. Adults born  before 1957 generally are considered immune to measles and mumps. Adults born in 1957 or later should have 1 or more doses of MMR vaccine unless there is a contraindication to the vaccine or there is laboratory evidence of immunity to each of the three diseases. A routine second dose of MMR vaccine should be obtained at least 28 days after the first dose for students attending postsecondary schools, health care workers, or international travelers. People who received inactivated measles vaccine or an unknown type of measles vaccine during 1963-1967 should receive 2 doses of MMR vaccine. People who received inactivated mumps vaccine or an unknown type of mumps vaccine before 1979 and are at high risk for mumps infection should consider immunization with 2 doses of MMR vaccine. For females of childbearing age, rubella immunity should be determined. If there is no evidence of immunity, females who are not pregnant should be vaccinated. If there is no evidence of immunity, females who are pregnant should delay immunization until after pregnancy. Unvaccinated health care workers born before 1957 who lack laboratory evidence of measles, mumps, or rubella immunity or laboratory confirmation of disease should consider measles and mumps immunization with 2 doses of MMR vaccine or rubella immunization with 1 dose of MMR vaccine.  Pneumococcal 13-valent conjugate (PCV13) vaccine. When indicated, a person who is uncertain of his immunization history and has no record of immunization should receive the PCV13 vaccine. All adults 65 years of age and older should receive this  vaccine. An adult aged 19 years or older who has certain medical conditions and has not been previously immunized should receive 1 dose of PCV13 vaccine. This PCV13 should be followed with a dose of pneumococcal polysaccharide (PPSV23) vaccine. Adults who are at high risk for pneumococcal disease should obtain the PPSV23 vaccine at least 8 weeks after the dose of PCV13 vaccine. Adults older than 80 years of age who have normal immune system function should obtain the PPSV23 vaccine dose at least 1 year after the dose of PCV13 vaccine.  Pneumococcal polysaccharide (PPSV23) vaccine. When PCV13 is also indicated, PCV13 should be obtained first. All adults aged 65 years and older should be immunized. An adult younger than age 65 years who has certain medical conditions should be immunized. Any person who resides in a nursing home or long-term care facility should be immunized. An adult smoker should be immunized. People with an immunocompromised condition and certain other conditions should receive both PCV13 and PPSV23 vaccines. People with human immunodeficiency virus (HIV) infection should be immunized as soon as possible after diagnosis. Immunization during chemotherapy or radiation therapy should be avoided. Routine use of PPSV23 vaccine is not recommended for American Indians, [place] Natives, or people younger than 65 years unless there are medical conditions that require PPSV23 vaccine. When indicated, people who have unknown immunization and have no record of immunization should receive PPSV23 vaccine. One-time revaccination 5 years after the first dose of PPSV23 is recommended for people aged 19-64 years who have chronic kidney failure, nephrotic syndrome, asplenia, or immunocompromised conditions. People who received 1-2 doses of PPSV23 before age 65 years should receive another dose of PPSV23 vaccine at age 65 years or later if at least 5 years have passed since the previous dose. Doses of PPSV23 are not  needed for people immunized with PPSV23 at or after age 65 years.  Meningococcal vaccine. Adults with asplenia or persistent complement component deficiencies should receive 2 doses of quadrivalent meningococcal conjugate (MenACWY-D) vaccine. The doses should be obtained   at least 2 months apart. Microbiologists working with certain meningococcal bacteria, [place] recruits, people at risk during an outbreak, and people who travel to or live in countries with a high rate of meningitis should be immunized. A first-year college student up through age 34 years who is living in a residence hall should receive a dose if she did not receive a dose on or after her 16th birthday. Adults who have certain high-risk conditions should receive one or more doses of vaccine.  Hepatitis A vaccine. Adults who wish to be protected from this disease, have certain high-risk conditions, work with hepatitis A-infected animals, work in hepatitis A research labs, or travel to or work in countries with a high rate of hepatitis A should be immunized. Adults who were previously unvaccinated and who anticipate close contact with an international adoptee during the first 60 days after arrival in the [place] from a country with a high rate of hepatitis A should be immunized.  Hepatitis B vaccine. Adults who wish to be protected from this disease, have certain high-risk conditions, may be exposed to blood or other infectious body fluids, are household contacts or sex partners of hepatitis B positive people, are clients or workers in certain care facilities, or travel to or work in countries with a high rate of hepatitis B should be immunized.  Haemophilus influenzae type b (Hib) vaccine. A previously unvaccinated person with asplenia or sickle cell disease or having a scheduled splenectomy should receive 1 dose of Hib vaccine. Regardless of previous immunization, a recipient of a hematopoietic stem cell transplant should receive a  3-dose series 6-12 months after her successful transplant. Hib vaccine is not recommended for adults with HIV infection. Preventive Services / Frequency Ages 35 to 4 years  Blood pressure check.** / Every 3-5 years.  Lipid and cholesterol check.** / Every 5 years beginning at age 60.  Clinical breast exam.** / Every 3 years for women in their 71s and 10s.  BRCA-related cancer risk assessment.** / For women who have family members with a BRCA-related cancer (breast, ovarian, tubal, or peritoneal cancers).  Pap test.** / Every 2 years from ages 76 through 26. Every 3 years starting at age 61 through age 76 or 93 with a history of 3 consecutive normal Pap tests.  HPV screening.** / Every 3 years from ages 37 through ages 60 to 51 with a history of 3 consecutive normal Pap tests.  Hepatitis C blood test.** / For any individual with known risks for hepatitis C.  Skin self-exam. / Monthly.  Influenza vaccine. / Every year.  Tetanus, diphtheria, and acellular pertussis (Tdap, Td) vaccine.** / Consult your health care provider. Pregnant women should receive 1 dose of Tdap vaccine during each pregnancy. 1 dose of Td every 10 years.  Varicella vaccine.** / Consult your health care provider. Pregnant females who do not have evidence of immunity should receive the first dose after pregnancy.  HPV vaccine. / 3 doses over 6 months, if 93 and younger. The vaccine is not recommended for use in pregnant females. However, pregnancy testing is not needed before receiving a dose.  Measles, mumps, rubella (MMR) vaccine.** / You need at least 1 dose of MMR if you were born in 1957 or later. You may also need a 2nd dose. For females of childbearing age, rubella immunity should be determined. If there is no evidence of immunity, females who are not pregnant should be vaccinated. If there is no evidence of immunity, females who are  pregnant should delay immunization until after pregnancy.  Pneumococcal  13-valent conjugate (PCV13) vaccine.** / Consult your health care provider.  Pneumococcal polysaccharide (PPSV23) vaccine.** / 1 to 2 doses if you smoke cigarettes or if you have certain conditions.  Meningococcal vaccine.** / 1 dose if you are age 68 to 8 years and a Market researcher living in a residence hall, or have one of several medical conditions, you need to get vaccinated against meningococcal disease. You may also need additional booster doses.  Hepatitis A vaccine.** / Consult your health care provider.  Hepatitis B vaccine.** / Consult your health care provider.  Haemophilus influenzae type b (Hib) vaccine.** / Consult your health care provider. Ages 7 to 53 years  Blood pressure check.** / Every year.  Lipid and cholesterol check.** / Every 5 years beginning at age 25 years.  Lung cancer screening. / Every year if you are aged 11-80 years and have a 30-pack-year history of smoking and currently smoke or have quit within the past 15 years. Yearly screening is stopped once you have quit smoking for at least 15 years or develop a health problem that would prevent you from having lung cancer treatment.  Clinical breast exam.** / Every year after age 48 years.  BRCA-related cancer risk assessment.** / For women who have family members with a BRCA-related cancer (breast, ovarian, tubal, or peritoneal cancers).  Mammogram.** / Every year beginning at age 41 years and continuing for as long as you are in good health. Consult with your health care provider.  Pap test.** / Every 3 years starting at age 65 years through age 37 or 70 years with a history of 3 consecutive normal Pap tests.  HPV screening.** / Every 3 years from ages 72 years through ages 60 to 40 years with a history of 3 consecutive normal Pap tests.  Fecal occult blood test (FOBT) of stool. / Every year beginning at age 21 years and continuing until age 5 years. You may not need to do this test if you get  a colonoscopy every 10 years.  Flexible sigmoidoscopy or colonoscopy.** / Every 5 years for a flexible sigmoidoscopy or every 10 years for a colonoscopy beginning at age 35 years and continuing until age 48 years.  Hepatitis C blood test.** / For all people born from [birth year] through 1965 and any individual with known risks for hepatitis C.  Skin self-exam. / Monthly.  Influenza vaccine. / Every year.  Tetanus, diphtheria, and acellular pertussis (Tdap/Td) vaccine.** / Consult your health care provider. Pregnant women should receive 1 dose of Tdap vaccine during each pregnancy. 1 dose of Td every 10 years.  Varicella vaccine.** / Consult your health care provider. Pregnant females who do not have evidence of immunity should receive the first dose after pregnancy.  Zoster vaccine.** / 1 dose for adults aged 30 years or older.  Measles, mumps, rubella (MMR) vaccine.** / You need at least 1 dose of MMR if you were born in 1957 or later. You may also need a second dose. For females of childbearing age, rubella immunity should be determined. If there is no evidence of immunity, females who are not pregnant should be vaccinated. If there is no evidence of immunity, females who are pregnant should delay immunization until after pregnancy.  Pneumococcal 13-valent conjugate (PCV13) vaccine.** / Consult your health care provider.  Pneumococcal polysaccharide (PPSV23) vaccine.** / 1 to 2 doses if you smoke cigarettes or if you have certain conditions.  Meningococcal vaccine.** /  Consult your health care provider.  Hepatitis A vaccine.** / Consult your health care provider.  Hepatitis B vaccine.** / Consult your health care provider.  Haemophilus influenzae type b (Hib) vaccine.** / Consult your health care provider. Ages 64 years and over  Blood pressure check.** / Every year.  Lipid and cholesterol check.** / Every 5 years beginning at age 23 years.  Lung cancer screening. / Every year if you  are aged 16-80 years and have a 30-pack-year history of smoking and currently smoke or have quit within the past 15 years. Yearly screening is stopped once you have quit smoking for at least 15 years or develop a health problem that would prevent you from having lung cancer treatment.  Clinical breast exam.** / Every year after age 74 years.  BRCA-related cancer risk assessment.** / For women who have family members with a BRCA-related cancer (breast, ovarian, tubal, or peritoneal cancers).  Mammogram.** / Every year beginning at age 44 years and continuing for as long as you are in good health. Consult with your health care provider.  Pap test.** / Every 3 years starting at age 58 years through age 22 or 39 years with 3 consecutive normal Pap tests. Testing can be stopped between 65 and 70 years with 3 consecutive normal Pap tests and no abnormal Pap or HPV tests in the past 10 years.  HPV screening.** / Every 3 years from ages 64 years through ages 70 or 61 years with a history of 3 consecutive normal Pap tests. Testing can be stopped between 65 and 70 years with 3 consecutive normal Pap tests and no abnormal Pap or HPV tests in the past 10 years.  Fecal occult blood test (FOBT) of stool. / Every year beginning at age 40 years and continuing until age 27 years. You may not need to do this test if you get a colonoscopy every 10 years.  Flexible sigmoidoscopy or colonoscopy.** / Every 5 years for a flexible sigmoidoscopy or every 10 years for a colonoscopy beginning at age 7 years and continuing until age 32 years.  Hepatitis C blood test.** / For all people born from [birth year] through 1963 and any individual with known risks for hepatitis C.  Osteoporosis screening.** / A one-time screening for women ages 30 years and over and women at risk for fractures or osteoporosis.  Skin self-exam. / Monthly.  Influenza vaccine. / Every year.  Tetanus, diphtheria, and acellular pertussis (Tdap/Td)  vaccine.** / 1 dose of Td every 10 years.  Varicella vaccine.** / Consult your health care provider.  Zoster vaccine.** / 1 dose for adults aged 35 years or older.  Pneumococcal 13-valent conjugate (PCV13) vaccine.** / Consult your health care provider.  Pneumococcal polysaccharide (PPSV23) vaccine.** / 1 dose for all adults aged 46 years and older.  Meningococcal vaccine.** / Consult your health care provider.  Hepatitis A vaccine.** / Consult your health care provider.  Hepatitis B vaccine.** / Consult your health care provider.  Haemophilus influenzae type b (Hib) vaccine.** / Consult your health care provider. ** Family history and personal history of risk and conditions may change your health care provider's recommendations.   This information is not intended to replace advice given to you by your health care provider. Make sure you discuss any questions you have with your health care provider.   Document Released: 07/20/2001 Document Revised: 06/14/2014 Document Reviewed: 10/19/2010 Elsevier Interactive Patient Education [employer].

## 2015-08-26 ENCOUNTER — Other Ambulatory Visit: Payer: Self-pay

## 2015-08-26 MED ORDER — TIZANIDINE HCL 4 MG PO TABS
4.0000 mg | ORAL_TABLET | Freq: Three times a day (TID) | ORAL | Status: DC | PRN
Start: 2015-08-26 — End: 2015-08-27

## 2015-08-27 ENCOUNTER — Ambulatory Visit (INDEPENDENT_AMBULATORY_CARE_PROVIDER_SITE_OTHER): Payer: Medicare Other | Admitting: Cardiovascular Disease

## 2015-08-27 ENCOUNTER — Encounter: Payer: Self-pay | Admitting: Cardiovascular Disease

## 2015-08-27 VITALS — BP 138/78 | HR 73 | Ht 64.0 in | Wt 171.0 lb

## 2015-08-27 DIAGNOSIS — I2510 Atherosclerotic heart disease of native coronary artery without angina pectoris: Secondary | ICD-10-CM

## 2015-08-27 DIAGNOSIS — I10 Essential (primary) hypertension: Secondary | ICD-10-CM

## 2015-08-27 MED ORDER — AMLODIPINE BESYLATE 2.5 MG PO TABS: 2.5000 mg | ORAL_TABLET | Freq: Every day | ORAL | Status: DC

## 2015-08-27 NOTE — Progress Notes (Signed)
08/27/2015 [place]   01-Mar-1929  OB:[id number]  Primary Physician Deanna Koyanagi, DO Primary Cardiologist: Deanna Harp MD FACP,FACC,FAHA, FSCAI   HPI:  .The patient is a delightful 82 year old, mildly overweight, married Caucasian female with no children whose husband Deanna Shields is also a long-term patient of mine. Unfortunately, he has mesothelioma treated by Deanna Shields secondary to remote asbestos exposure and is currently in hospice.. I last saw her in the office 4 /11/16 at the time of her cardiac catheterization.[name]   She was remotely a patient of Dr. Alla Shields. She had a cath done by him Oct 23, 1999, after an abnormal Myoview done because of chest pain that showed minimal CAD.[name] Her risk factors at that time were family history and hyperlipidemia. She does not smoke or drink alcohol. She has been on statin drugs in the past.  Deanna Shields was admitted to the hospital 10/28/13 for 2 days because of chest pain/rule out myocardial function. A 2-D echo and Myoview stress test were normal. She was seen back a week later because of witnessed syncope. Over the ensuing  months she's noticed jaw pain with associated with left shoulder arm back and chest pain with increasing dyspnea on exertion. A myoview stress test was performed that showed moderate inferoapical ischemia. A 2-D echo was essentially normal. She underwent outpatient diagnostic coronary arteriography by myself 09/16/14 which was essentially normal. She's had no recurrent symptoms.   Current Outpatient Prescriptions  Medication Sig Dispense Refill  . amLODipine (NORVASC) 2.5 MG tablet Take 1 tablet (2.5 mg total) by mouth daily. 90 tablet 1  . aspirin 81 MG tablet Take 81 mg by mouth daily.     . Calcium Citrate (CITRACAL PO) Take 1 tablet by mouth daily.    . chlorpheniramine-HYDROcodone (TUSSIONEX PENNKINETIC ER) 10-8 MG/5ML SUER Take 5 mLs by mouth every 12 (twelve) hours as needed for cough. 140 mL 0  . losartan (COZAAR) 50  MG tablet Take 1 tablet (50 mg total) by mouth daily. 90 tablet 1   No current facility-administered medications for this visit.    Allergies  Allergen Reactions  . Contrast Media [Iodinated Diagnostic Agents] Itching and Rash    Pt covered in rash, red.  . Codeine Nausea And Vomiting    in large doses: can tolerate Tussionex    Social History   Social History  . Marital Status: Married    Spouse Name: N/A  . Number of Children: 0  . Years of Education: N/A   Occupational History  . retired     Geneticist, molecular   Social History Main Topics  . Smoking status: Never Smoker   . Smokeless tobacco: Never Used  . Alcohol Use: No  . Drug Use: No  . Sexual Activity: No   Other Topics Concern  . Not on file   Social History Narrative   Patient doesn't get regular exercise   married     Review of Systems: General: negative for chills, fever, night sweats or weight changes.  Cardiovascular: negative for chest pain, dyspnea on exertion, edema, orthopnea, palpitations, paroxysmal nocturnal dyspnea or shortness of breath Dermatological: negative for rash Respiratory: negative for cough or wheezing Urologic: negative for hematuria Abdominal: negative for nausea, vomiting, diarrhea, bright red blood per rectum, melena, or hematemesis Neurologic: negative for visual changes, syncope, or dizziness All other systems reviewed and are otherwise negative except as noted above.    Blood pressure 138/78, pulse 73, height 5\' 4"  (1.626 m),  weight 171 lb (77.565 kg).  General appearance: alert and no distress Neck: no adenopathy, no carotid bruit, no JVD, supple, symmetrical, trachea midline and thyroid not enlarged, symmetric, no tenderness/mass/nodules Lungs: clear to auscultation bilaterally Heart: regular rate and rhythm, S1, S2 normal, no murmur, click, rub or gallop Extremities: extremities normal, atraumatic, no cyanosis or edema  EKG normal sinus rhythm at 73 without ST or  T-wave changes. I personally reviewed this EKG  ASSESSMENT AND PLAN:   Essential hypertension History of hypertension blood pressure measured at 138/78.  She is on amlodipine and losartan. Continue current meds at current dosing      Deanna Harp MD [facility], [facility] 08/27/2015 10:40 AM

## 2015-08-27 NOTE — Assessment & Plan Note (Signed)
History of hypertension blood pressure measured at 138/78.  She is on amlodipine and losartan. Continue current meds at current dosing

## 2015-08-27 NOTE — Patient Instructions (Signed)

## 2015-08-29 ENCOUNTER — Telehealth: Payer: Self-pay | Admitting: Family Medicine

## 2015-08-29 NOTE — Telephone Encounter (Signed)
Relationship to patient: Self  Can be reached: [phone]  Reason for call: Called in to request lab results .

## 2015-08-29 NOTE — Telephone Encounter (Signed)
Blood count, urine, cholesterol look good.  Liver function is mildly abnormal.  I will defer further work up of her abnormal liver function to her PCP when she returns to the office.

## 2015-08-29 NOTE — Telephone Encounter (Signed)
Attempted to reach, she was not available. Left message for pt to return our call on Monday.

## 2015-09-01 ENCOUNTER — Other Ambulatory Visit: Payer: Self-pay | Admitting: Family Medicine

## 2015-09-01 DIAGNOSIS — R1084 Generalized abdominal pain: Secondary | ICD-10-CM

## 2015-09-01 DIAGNOSIS — R945 Abnormal results of liver function studies: Secondary | ICD-10-CM

## 2015-09-01 NOTE — Telephone Encounter (Signed)
Attempted to reach pt, unable to leave message; no voicemail.

## 2015-09-01 NOTE — Telephone Encounter (Signed)
Patient returning your call regarding lab results. °

## 2015-09-01 NOTE — Telephone Encounter (Signed)
Notes Recorded by Donell Sievert Ewing, CMA on 09/01/2015 at 2:51 PM Patient informed of results/PCP instructions. She did state she has not had an increase/change in otc medication. Scheduled her for a lap appt. 09/15/2015 and put order in for labs.

## 2015-09-01 NOTE — Telephone Encounter (Signed)
See labs 

## 2015-09-01 NOTE — Telephone Encounter (Signed)
Pt called back again.  Relationship to patient: Self  Can be reached: [phone]

## 2015-09-08 ENCOUNTER — Other Ambulatory Visit (INDEPENDENT_AMBULATORY_CARE_PROVIDER_SITE_OTHER): Payer: Medicare Other

## 2015-09-08 DIAGNOSIS — R1084 Generalized abdominal pain: Secondary | ICD-10-CM | POA: Diagnosis not present

## 2015-09-08 DIAGNOSIS — R945 Abnormal results of liver function studies: Secondary | ICD-10-CM

## 2015-09-08 DIAGNOSIS — K7689 Other specified diseases of liver: Secondary | ICD-10-CM

## 2015-09-08 DIAGNOSIS — K769 Liver disease, unspecified: Secondary | ICD-10-CM | POA: Diagnosis not present

## 2015-09-08 LAB — HEPATIC FUNCTION PANEL
ALBUMIN: 4.3 g/dL (ref 3.5–5.2)
ALT: 16 U/L (ref 0–35)
AST: 21 U/L (ref 0–37)
Alkaline Phosphatase: 84 U/L (ref 39–117)
Bilirubin, Direct: 0.1 mg/dL (ref 0.0–0.3)
TOTAL PROTEIN: 6.9 g/dL (ref 6.0–8.3)
Total Bilirubin: 0.7 mg/dL (ref 0.2–1.2)

## 2015-09-08 LAB — GAMMA GT: GGT: 79 U/L — ABNORMAL HIGH (ref 7–51)

## 2015-09-08 LAB — HEPATITIS PANEL, ACUTE
HCV Ab: NEGATIVE
HEP B S AG: NEGATIVE
Hep A IgM: NONREACTIVE
Hep B C IgM: NONREACTIVE

## 2015-09-11 NOTE — Telephone Encounter (Signed)
Pt called in to get lab results .  Pt says that she will be leaving home at 3 to go to an appt. She would like to know if CMA could call her before then if possible.     CB: [phone]

## 2015-09-12 ENCOUNTER — Telehealth: Payer: Self-pay | Admitting: Family Medicine

## 2015-09-12 NOTE — Telephone Encounter (Signed)
Spoke with [name] and made him aware to make the patient aware that we should hopefully have the results on Monday.   KP

## 2015-09-12 NOTE — Telephone Encounter (Signed)
Relation to [id number] Call back number:[phone]   Reason for call:  Patient inquiring about lab results taken 09/15/15

## 2015-09-15 ENCOUNTER — Other Ambulatory Visit: Payer: Medicare Other

## 2015-09-15 ENCOUNTER — Telehealth: Payer: Self-pay

## 2015-09-15 NOTE — Telephone Encounter (Signed)
Ewing Schlein, [place], [place]     Phone Number: [phone]            Message left call.  Kp       Previous Messages     ----- Message -----   From: Quintin Alto   Sent: 09/15/2015  8:22 AM    To: Ewing Schlein, CMA  Subject: Returned Your Call                Returned your call from Friday. Plse call her today with results       Notes Recorded by [name], DO on 09/13/2015 at 2:06 PM Much better Recheck with regular labs  Patient has been made aware and verbalized understanding.    KP

## 2015-09-18 ENCOUNTER — Ambulatory Visit: Payer: Medicare Other | Admitting: Family Medicine

## 2015-10-27 ENCOUNTER — Other Ambulatory Visit: Payer: Self-pay | Admitting: Family Medicine

## 2015-10-27 ENCOUNTER — Ambulatory Visit: Payer: Medicare Other | Admitting: Audiology

## 2015-10-27 NOTE — Telephone Encounter (Signed)
Caller name: Self  Can be reached: [phone]   Pharmacy:  [id number] - [place], [place] - [facility] [phone] (Phone) [phone] (Fax)         Reason for call: States that the medication that was given to her for muscle spasms she can not take. Request another rx.

## 2015-10-28 NOTE — Telephone Encounter (Signed)
Patient reported that she's still having some muscle spasms that's radiating from her back down to both legs and has requested a new Rx. Zanaflex, the last medication ordered, patient voiced that she had a reaction, which caused drying of the lips & tongue, as well as difficulty with swallowing.  Please advise. Thanks.

## 2015-10-28 NOTE — Telephone Encounter (Signed)
So ---- is the zanaflex what she wants----?

## 2015-10-29 ENCOUNTER — Other Ambulatory Visit: Payer: Self-pay

## 2015-10-29 MED ORDER — METHOCARBAMOL 500 MG PO TABS: 500.0000 mg | ORAL_TABLET | Freq: Four times a day (QID) | ORAL | Status: DC | PRN

## 2015-10-29 NOTE — Telephone Encounter (Signed)
We can try a different one but I believe zanaflex is the only one medicare will pay for Robaxin 500 mg 1-2 po qid prn #40

## 2015-10-29 NOTE — Telephone Encounter (Signed)
Patient made aware. Rx sent to preferred pharmacy.

## 2015-10-29 NOTE — Telephone Encounter (Signed)
Patient voiced that she would like something other than the Zanaflex.

## 2015-10-29 NOTE — Addendum Note (Signed)
Addended by: Eduard Roux E on: 10/29/2015 10:08 AM   Modules accepted: Orders

## 2015-11-06 ENCOUNTER — Telehealth: Payer: Self-pay | Admitting: Internal Medicine

## 2015-11-06 DIAGNOSIS — R195 Other fecal abnormalities: Secondary | ICD-10-CM

## 2015-11-07 NOTE — Telephone Encounter (Signed)
Pt reports on Tuesday she started having problems with nausea but she did not vomit. Her stomach was hurting Tuesday am and Wednesday, she is feeling better today but states she is very weak. Reports she has not wanted to eat much at all, she is drinking some fluids. Pt was concerned because yesterday she had a BM that was "black as tar." Pt reports she did take pepto bismol Wednesday, discussed with her that it can make the stool very dark black. Pt wanted to know if Dr. Hilarie Fredrickson had any other recommendations. Please advise.

## 2015-11-07 NOTE — Telephone Encounter (Signed)
I would recommend that she use Zantac 150 milligrams twice daily for about a week Dark stools may be secondary to Pepto-Bismol Would recommend that we have her come for CBC If symptoms fail to improve, including if black stools persist, she needs to be seen

## 2015-11-07 NOTE — Telephone Encounter (Signed)
Pt aware, order in epic for labs.

## 2015-11-10 ENCOUNTER — Other Ambulatory Visit (INDEPENDENT_AMBULATORY_CARE_PROVIDER_SITE_OTHER): Payer: Medicare Other

## 2015-11-10 DIAGNOSIS — R195 Other fecal abnormalities: Secondary | ICD-10-CM

## 2015-11-10 LAB — CBC WITH DIFFERENTIAL/PLATELET
BASOS ABS: 0 10*3/uL (ref 0.0–0.1)
BASOS PCT: 0.6 % (ref 0.0–3.0)
EOS ABS: 0.3 10*3/uL (ref 0.0–0.7)
Eosinophils Relative: 4.5 % (ref 0.0–5.0)
HEMATOCRIT: 38.1 % (ref 36.0–46.0)
Hemoglobin: 12.7 g/dL (ref 12.0–15.0)
LYMPHS PCT: 35.1 % (ref 12.0–46.0)
Lymphs Abs: 2.5 10*3/uL (ref 0.7–4.0)
MCHC: 33.2 g/dL (ref 30.0–36.0)
MCV: 91.8 fl (ref 78.0–100.0)
MONO ABS: 0.6 10*3/uL (ref 0.1–1.0)
Monocytes Relative: 8.3 % (ref 3.0–12.0)
NEUTROS PCT: 51.5 % (ref 43.0–77.0)
Neutro Abs: 3.6 10*3/uL (ref 1.4–7.7)
PLATELETS: 303 10*3/uL (ref 150.0–400.0)
RBC: 4.15 Mil/uL (ref 3.87–5.11)
RDW: 14.6 % (ref 11.5–15.5)
WBC: 7 10*3/uL (ref 4.0–10.5)

## 2015-11-24 ENCOUNTER — Telehealth: Payer: Self-pay | Admitting: Family Medicine

## 2015-11-24 DIAGNOSIS — R059 Cough, unspecified: Secondary | ICD-10-CM

## 2015-11-24 DIAGNOSIS — R05 Cough: Secondary | ICD-10-CM

## 2015-11-24 MED ORDER — HYDROCOD POLST-CPM POLST ER 10-8 MG/5ML PO SUER: 5.0000 mL | Freq: Two times a day (BID) | ORAL | Status: DC | PRN

## 2015-11-24 NOTE — Telephone Encounter (Signed)
Last seen and filled 08/25/15 #155ml   Please advise    KP

## 2015-11-24 NOTE — Telephone Encounter (Signed)
Can be reached: [phone] Pharmacy: [place], [place]   Reason for call: pt needing refill on Tussionex. She is almost out.

## 2015-11-24 NOTE — Telephone Encounter (Signed)
Refill x1 

## 2015-11-25 MED FILL — HYDROCODONE-CHLORPHENIRAM S: 10-8 | 14 days supply | Qty: 140 | Fill #0

## 2015-11-25 NOTE — Telephone Encounter (Signed)
Patient aware the Rx is ready for pick up.     KP 

## 2015-12-16 ENCOUNTER — Ambulatory Visit: Payer: Medicare Other | Admitting: Family Medicine

## 2015-12-23 ENCOUNTER — Ambulatory Visit (INDEPENDENT_AMBULATORY_CARE_PROVIDER_SITE_OTHER): Payer: Medicare Other | Admitting: Family Medicine

## 2015-12-23 ENCOUNTER — Encounter: Payer: Self-pay | Admitting: Family Medicine

## 2015-12-23 VITALS — BP 130/82 | HR 66 | Temp 98.9°F | Wt 179.4 lb

## 2015-12-23 DIAGNOSIS — I2510 Atherosclerotic heart disease of native coronary artery without angina pectoris: Secondary | ICD-10-CM | POA: Diagnosis not present

## 2015-12-23 DIAGNOSIS — M5441 Lumbago with sciatica, right side: Secondary | ICD-10-CM

## 2015-12-23 MED ORDER — TIZANIDINE HCL 4 MG PO TABS
4.0000 mg | ORAL_TABLET | Freq: Four times a day (QID) | ORAL | Status: DC | PRN
Start: 2015-12-23 — End: 2016-05-31

## 2015-12-23 NOTE — Patient Instructions (Signed)

## 2015-12-23 NOTE — Progress Notes (Signed)
  Subjective:    Deanna Shields is a 82 y.o. female who presents for follow up of low back problems. Current symptoms include: numbness in R leg, pain in r leg to ankle (aching in character; 10/10 in severity) and weakness in r leg. Symptoms have significantly worsened from the previous visit. Exacerbating factors identified by the patient are bending forwards, recumbency, sitting, standing and walking.  The following portions of the patient's history were reviewed and updated as appropriate:  She  has a past medical history of Hypertension; Rhinitis, allergic; Hemorrhoids (2004); Hiatal hernia (2004); Esophageal stricture (2009); GERD (gastroesophageal reflux disease) (2004); Dizziness; Syncopal episodes; Gallstones; HOH (hard of hearing); History of blood transfusion (1947); Arthritis; and Chronic shoulder pain. She  does not have any pertinent problems on file. She  has past surgical history that includes Vaginal hysterectomy (1970's); Hemorroidectomy; Knee arthroscopy (Bilateral); Carpal tunnel release (Right, 1990's); NM MYOVIEW LTD (06/01/2012); left heart catheterization with coronary angiogram (N/A, 09/16/2014); Appendectomy (1947); Laparoscopic cholecystectomy (01/29/2015); Dilation and curettage of uterus ("couple"); Cardiac catheterization (10/23/1999; 09/2014); Esophagogastroduodenoscopy (egd) with esophageal dilation (2-3 times); and Cholecystectomy (N/A, 01/29/2015). Her family history includes Heart disease in her sister; Stroke (age of onset: 71) in her father. There is no history of Coronary artery disease, Diabetes, or Cancer. She  reports that she has never smoked. She has never used smokeless tobacco. She reports that she does not drink alcohol or use illicit drugs. She has a current medication list which includes the following prescription(s): amlodipine, aspirin, calcium citrate, chlorpheniramine-hydrocodone, losartan, and tizanidine. Current Outpatient Prescriptions on File Prior to  Visit  Medication Sig Dispense Refill  . amLODipine (NORVASC) 2.5 MG tablet Take 1 tablet (2.5 mg total) by mouth daily. 90 tablet 3  . aspirin 81 MG tablet Take 81 mg by mouth daily.     . Calcium Citrate (CITRACAL PO) Take 1 tablet by mouth daily.    . chlorpheniramine-HYDROcodone (TUSSIONEX PENNKINETIC ER) 10-8 MG/5ML SUER Take 5 mLs by mouth every 12 (twelve) hours as needed for cough. 140 mL 0  . losartan (COZAAR) 50 MG tablet Take 1 tablet (50 mg total) by mouth daily. 90 tablet 1   No current facility-administered medications on file prior to visit.   She is allergic to contrast media and codeine..    Objective:    BP 130/82 mmHg  Pulse 66  Temp(Src) 98.9 F (37.2 C) (Oral)  Wt 179 lb 6.4 oz (81.375 kg)  SpO2 97% General appearance: alert, cooperative, appears stated age and mild distress Back: pain with forward bending, standing, sitting , walking Lungs: clear to auscultation bilaterally Heart: S1, S2 normal Extremities: extremities normal, atraumatic, no cyanosis or edema Neurologic: Mental status: Alert, oriented, thought content appropriate Motor: grossly normal Reflexes: 2+ and symmetric Gait: Antalgic    Assessment:    Nonspecific acute low back pain    Plan:    Educational material distributed. Proper lifting, bending technique discussed. Stretching exercises discussed. Short (2-4 day) period of relative rest recommended until acute symptoms improve. Ice to affected area as needed for local pain relief. Heat to affected area as needed for local pain relief. NSAIDs per medication orders. Muscle relaxants per medication orders. Follow-up in 2 weeks.   Mri secondary to worsening pain

## 2015-12-23 NOTE — Progress Notes (Signed)
Pre visit review using our clinic review tool, if applicable. No additional management support is needed unless otherwise documented below in the visit note. 

## 2015-12-25 ENCOUNTER — Other Ambulatory Visit: Payer: Self-pay | Admitting: Family Medicine

## 2015-12-25 MED ORDER — LOSARTAN POTASSIUM 50 MG PO TABS: 50.0000 mg | ORAL_TABLET | Freq: Every day | ORAL | Status: DC

## 2015-12-25 NOTE — Telephone Encounter (Signed)
Refill request for losartan   Pharmacy: [place], [place]

## 2015-12-25 NOTE — Telephone Encounter (Signed)
Rx sent 

## 2015-12-26 MED FILL — tiZANidine HCL 4 MG TABS: 4 | 7 days supply | Qty: 30 | Fill #0

## 2015-12-27 ENCOUNTER — Ambulatory Visit (HOSPITAL_COMMUNITY)
Admission: RE | Admit: 2015-12-27 | Discharge: 2015-12-27 | Disposition: A | Discharge status: Home / Self Care | Payer: Medicare Other | Source: Ambulatory Visit | Attending: Family Medicine | Admitting: Family Medicine

## 2015-12-27 DIAGNOSIS — M5136 Other intervertebral disc degeneration, lumbar region: Secondary | ICD-10-CM | POA: Diagnosis not present

## 2015-12-27 DIAGNOSIS — M5186 Other intervertebral disc disorders, lumbar region: Secondary | ICD-10-CM | POA: Diagnosis not present

## 2015-12-27 DIAGNOSIS — M1288 Other specific arthropathies, not elsewhere classified, other specified site: Secondary | ICD-10-CM | POA: Insufficient documentation

## 2015-12-27 DIAGNOSIS — M5441 Lumbago with sciatica, right side: Secondary | ICD-10-CM | POA: Insufficient documentation

## 2015-12-27 DIAGNOSIS — M5126 Other intervertebral disc displacement, lumbar region: Secondary | ICD-10-CM | POA: Diagnosis not present

## 2015-12-30 ENCOUNTER — Telehealth: Payer: Self-pay | Admitting: Family Medicine

## 2015-12-30 DIAGNOSIS — M5126 Other intervertebral disc displacement, lumbar region: Secondary | ICD-10-CM

## 2015-12-30 DIAGNOSIS — M5136 Other intervertebral disc degeneration, lumbar region: Secondary | ICD-10-CM

## 2015-12-30 NOTE — Telephone Encounter (Signed)
Patient requesting results fax to Dr. Ardyth Gal orthopedic fax # [phone]

## 2015-12-30 NOTE — Telephone Encounter (Signed)
Discussed with patient and she agreed tot he referral, she would like to see Dr.Beane.    KP

## 2015-12-30 NOTE — Telephone Encounter (Signed)
Relation to [id number] Call back number:[phone]   Reason for call:  Patient inquiring about MRI results

## 2015-12-30 NOTE — Telephone Encounter (Signed)
Notes Recorded by [name], DO on 12/27/2015 at 11:37 AM Degenerative changes Bulging disc Refer to ortho

## 2016-01-02 DIAGNOSIS — M5137 Other intervertebral disc degeneration, lumbosacral region: Secondary | ICD-10-CM | POA: Diagnosis not present

## 2016-01-02 DIAGNOSIS — M5136 Other intervertebral disc degeneration, lumbar region: Secondary | ICD-10-CM | POA: Diagnosis not present

## 2016-01-02 DIAGNOSIS — M5431 Sciatica, right side: Secondary | ICD-10-CM | POA: Diagnosis not present

## 2016-01-15 DIAGNOSIS — H04123 Dry eye syndrome of bilateral lacrimal glands: Secondary | ICD-10-CM | POA: Diagnosis not present

## 2016-01-15 DIAGNOSIS — H25813 Combined forms of age-related cataract, bilateral: Secondary | ICD-10-CM | POA: Diagnosis not present

## 2016-01-15 DIAGNOSIS — H35373 Puckering of macula, bilateral: Secondary | ICD-10-CM | POA: Diagnosis not present

## 2016-02-03 DIAGNOSIS — D225 Melanocytic nevi of trunk: Secondary | ICD-10-CM | POA: Diagnosis not present

## 2016-02-03 DIAGNOSIS — L570 Actinic keratosis: Secondary | ICD-10-CM | POA: Diagnosis not present

## 2016-02-03 DIAGNOSIS — X32XXXD Exposure to sunlight, subsequent encounter: Secondary | ICD-10-CM | POA: Diagnosis not present

## 2016-02-16 DIAGNOSIS — H25811 Combined forms of age-related cataract, right eye: Secondary | ICD-10-CM | POA: Diagnosis not present

## 2016-02-16 DIAGNOSIS — H2511 Age-related nuclear cataract, right eye: Secondary | ICD-10-CM | POA: Diagnosis not present

## 2016-03-02 ENCOUNTER — Ambulatory Visit: Payer: Medicare Other | Admitting: Family Medicine

## 2016-03-04 ENCOUNTER — Telehealth: Payer: Self-pay | Admitting: Family Medicine

## 2016-03-04 DIAGNOSIS — R059 Cough, unspecified: Secondary | ICD-10-CM

## 2016-03-04 DIAGNOSIS — R05 Cough: Secondary | ICD-10-CM

## 2016-03-04 MED ORDER — HYDROCOD POLST-CPM POLST ER 10-8 MG/5ML PO SUER: 5.0000 mL | Freq: Two times a day (BID) | ORAL | 0 refills | Status: DC | PRN

## 2016-03-04 NOTE — Telephone Encounter (Signed)
Last seen 12/23/15 and filled 11/24/15 #117ml   Please advise      KP

## 2016-03-04 NOTE — Telephone Encounter (Signed)
Refill x1 

## 2016-03-04 NOTE — Telephone Encounter (Signed)
VM left advising Rx ready for pick up.     KP 

## 2016-03-04 NOTE — Telephone Encounter (Signed)
Patient is requesting a refill of chlorpheniramine-HYDROcodone Deanna Shields [facility]) 10-8 MG/5ML SUER   Patient Relation: Self Patient Phone: [phone] Pharmacy:  [place], [place]

## 2016-03-08 MED FILL — HYDROCODONE-CHLORPHENIRAM S: 10-8 | 14 days supply | Qty: 140 | Fill #0

## 2016-03-16 DIAGNOSIS — H2512 Age-related nuclear cataract, left eye: Secondary | ICD-10-CM | POA: Diagnosis not present

## 2016-04-12 ENCOUNTER — Other Ambulatory Visit: Payer: Self-pay | Admitting: Family Medicine

## 2016-04-16 ENCOUNTER — Telehealth: Payer: Self-pay | Admitting: Emergency Medicine

## 2016-04-16 ENCOUNTER — Other Ambulatory Visit: Payer: Self-pay | Admitting: Emergency Medicine

## 2016-04-16 MED ORDER — METHOCARBAMOL 500 MG PO TABS: 500.0000 mg | ORAL_TABLET | Freq: Four times a day (QID) | ORAL | 0 refills | Status: DC | PRN

## 2016-04-16 NOTE — Telephone Encounter (Signed)
Refill x1  -- ov if no improvement

## 2016-04-16 NOTE — Telephone Encounter (Signed)
Received refill request for METHOCARBAMOL TABS 500 MG. Medication is no longer on pt's current med list, removed 12/23/15. Is it ok to refill? Please advise

## 2016-04-20 ENCOUNTER — Other Ambulatory Visit: Payer: Self-pay

## 2016-04-20 MED ORDER — METHOCARBAMOL 500 MG PO TABS: 500.0000 mg | ORAL_TABLET | Freq: Four times a day (QID) | ORAL | 0 refills | Status: DC | PRN

## 2016-04-26 DIAGNOSIS — H25042 Posterior subcapsular polar age-related cataract, left eye: Secondary | ICD-10-CM | POA: Diagnosis not present

## 2016-04-26 DIAGNOSIS — H25812 Combined forms of age-related cataract, left eye: Secondary | ICD-10-CM | POA: Diagnosis not present

## 2016-04-26 DIAGNOSIS — H2512 Age-related nuclear cataract, left eye: Secondary | ICD-10-CM | POA: Diagnosis not present

## 2016-05-10 ENCOUNTER — Encounter: Payer: Self-pay | Admitting: Family Medicine

## 2016-05-10 ENCOUNTER — Ambulatory Visit (INDEPENDENT_AMBULATORY_CARE_PROVIDER_SITE_OTHER): Payer: Medicare Other | Admitting: Family Medicine

## 2016-05-10 VITALS — BP 142/68 | HR 80 | Temp 97.7°F | Resp 17 | Ht 64.0 in | Wt 176.2 lb

## 2016-05-10 DIAGNOSIS — R05 Cough: Secondary | ICD-10-CM | POA: Diagnosis not present

## 2016-05-10 DIAGNOSIS — I10 Essential (primary) hypertension: Secondary | ICD-10-CM | POA: Diagnosis not present

## 2016-05-10 DIAGNOSIS — E785 Hyperlipidemia, unspecified: Secondary | ICD-10-CM

## 2016-05-10 DIAGNOSIS — F439 Reaction to severe stress, unspecified: Secondary | ICD-10-CM | POA: Diagnosis not present

## 2016-05-10 DIAGNOSIS — I2510 Atherosclerotic heart disease of native coronary artery without angina pectoris: Secondary | ICD-10-CM | POA: Diagnosis not present

## 2016-05-10 DIAGNOSIS — R059 Cough, unspecified: Secondary | ICD-10-CM

## 2016-05-10 MED ORDER — HYDROCOD POLST-CPM POLST ER 10-8 MG/5ML PO SUER: 5.0000 mL | Freq: Two times a day (BID) | ORAL | 0 refills | Status: DC | PRN

## 2016-05-10 MED ORDER — ALPRAZOLAM 0.25 MG PO TABS: 0.2500 mg | ORAL_TABLET | Freq: Three times a day (TID) | ORAL | 0 refills | Status: DC | PRN

## 2016-05-10 MED FILL — HYDROCODONE-CHLORPHENIRAM S: 10-8 | 14 days supply | Qty: 140 | Fill #0

## 2016-05-10 MED FILL — ALPRAZolam 0.25 MG TABS: 0.25 | 10 days supply | Qty: 30 | Fill #0

## 2016-05-10 NOTE — Progress Notes (Signed)
Pre visit review using our clinic review tool, if applicable. No additional management support is needed unless otherwise documented below in the visit note. 

## 2016-05-10 NOTE — Progress Notes (Signed)
Patient ID: Deanna Shields, female    DOB: Oct 30, 1928  Age: 82 y.o. MRN: [id number]    Subjective:  Subjective  HPI Deanna Shields presents for bp f/u and refill on cough meds.  She has had cough completely worked up in past and has seen pulm --- has had tusionex for years.   She is also grieving the death of her husband ---recent.    Review of Systems  Constitutional: Negative for appetite change, diaphoresis, fatigue and unexpected weight change.  Eyes: Negative for pain, redness and visual disturbance.  Respiratory: Positive for cough. Negative for chest tightness, shortness of breath and wheezing.   Cardiovascular: Negative for chest pain, palpitations and leg swelling.  Endocrine: Negative for cold intolerance, heat intolerance, polydipsia, polyphagia and polyuria.  Genitourinary: Negative for difficulty urinating, dysuria and frequency.  Neurological: Negative for dizziness, light-headedness, numbness and headaches.    History Past Medical History:  Diagnosis Date  . Arthritis    "right thumb" (01/29/2015)  . Chronic shoulder pain    "between my shoulders" (01/29/2015)  . Dizziness   . Esophageal stricture 2009  . Gallstones   . GERD (gastroesophageal reflux disease) 2004  . Hemorrhoids 2004  . Hiatal hernia 2004  . History of blood transfusion 1947   "appendix ruptured"  . HOH (hard of hearing)   . Hypertension   . Rhinitis, allergic   . Syncopal episodes     She has a past surgical history that includes Vaginal hysterectomy (1970's); Hemorroidectomy; Knee arthroscopy (Bilateral); Carpal tunnel release (Right, 1990's); NM MYOVIEW LTD (06/01/2012); left heart catheterization with coronary angiogram (N/A, 09/16/2014); Appendectomy (1947); Laparoscopic cholecystectomy (01/29/2015); Dilation and curettage of uterus ("couple"); Cardiac catheterization (10/23/1999; 09/2014); Esophagogastroduodenoscopy (egd) with esophageal dilation (2-3 times); and Cholecystectomy (N/A,  01/29/2015).   Her family history includes Heart disease in her sister; Stroke (age of onset: 44) in her father.She reports that she has never smoked. She has never used smokeless tobacco. She reports that she does not drink alcohol or use drugs.  Current Outpatient Prescriptions on File Prior to Visit  Medication Sig Dispense Refill  . amLODipine (NORVASC) 2.5 MG tablet Take 1 tablet (2.5 mg total) by mouth daily. 90 tablet 3  . aspirin 81 MG tablet Take 81 mg by mouth daily.     . Calcium Citrate (CITRACAL PO) Take 1 tablet by mouth daily.    [name] losartan (COZAAR) 50 MG tablet Take 1 tablet (50 mg total) by mouth daily. 90 tablet 1  . methocarbamol (ROBAXIN) 500 MG tablet Take 1 tablet (500 mg total) by mouth 4 (four) times daily as needed for muscle spasms. 40 tablet 0  . omeprazole (PRILOSEC) 40 MG capsule TAKE 1 CAPSULE DAILY 90 capsule 3  . tiZANidine (ZANAFLEX) 4 MG tablet Take 1 tablet (4 mg total) by mouth every 6 (six) hours as needed for muscle spasms. 30 tablet 0  . tiZANidine (ZANAFLEX) 4 MG tablet TAKE 1 TABLET THREE TIMES A DAY AS NEEDED FOR MUSCLE SPASMS 30 tablet 0   No current facility-administered medications on file prior to visit.      Objective:  Objective  Physical Exam  Constitutional: She is oriented to person, place, and time. She appears well-developed and well-nourished.  HENT:  Head: Normocephalic and atraumatic.  Eyes: Conjunctivae and EOM are normal.  Neck: Normal range of motion. Neck supple. No JVD present. Carotid bruit is not present. No thyromegaly present.  Cardiovascular: Normal rate, regular rhythm and normal heart sounds.  No murmur heard. Pulmonary/Chest: Effort normal and breath sounds normal. No respiratory distress. She has no wheezes. She has no rales. She exhibits no tenderness.  Musculoskeletal: She exhibits no edema.  Neurological: She is alert and oriented to person, place, and time.  Psychiatric: Her behavior is normal. Judgment and  thought content normal. She exhibits a depressed mood.  Teary --- over loss of her husband  Nursing note and vitals reviewed.  BP (!) 142/68 (BP Location: Right Arm, Patient Position: Sitting, Cuff Size: Large)   Pulse 80   Temp 97.7 F (36.5 C) (Oral)   Resp 17   Ht 5\' 4"  (1.626 m)   Wt 176 lb 3.2 oz (79.9 kg)   SpO2 98%   BMI 30.24 kg/m  Wt Readings from Last 3 Encounters:  05/10/16 176 lb 3.2 oz (79.9 kg)  12/23/15 179 lb 6.4 oz (81.4 kg)  08/27/15 171 lb (77.6 kg)     Lab Results  Component Value Date   WBC 7.0 11/10/2015   HGB 12.7 11/10/2015   HCT 38.1 11/10/2015   PLT 303.0 11/10/2015   GLUCOSE 101 (H) 08/25/2015   CHOL 170 08/25/2015   TRIG 108.0 08/25/2015   HDL 56.00 08/25/2015   LDLDIRECT 161.2 07/18/2013   LDLCALC 93 08/25/2015   ALT 16 09/08/2015   AST 21 09/08/2015   NA 140 08/25/2015   K 5.0 08/25/2015   CL 104 08/25/2015   CREATININE 1.14 08/25/2015   BUN 21 08/25/2015   CO2 30 08/25/2015   TSH 2.780 10/28/2013   INR 0.99 09/09/2014    Mr Lumbar Spine Wo Contrast  Addendum Date: 12/27/2015   ADDENDUM REPORT: 12/27/2015 12:19 ADDENDUM: Deanna Shields in the common bile duct incidentally discovered. Electronically Signed   By: Nelson Chimes M.D.   On: 12/27/2015 12:19  Result Date: 12/27/2015 CLINICAL DATA:  Back pain radiating to the right hip and right leg, 9 months duration. EXAM: MRI LUMBAR SPINE WITHOUT CONTRAST TECHNIQUE: Multiplanar, multisequence MR imaging of the lumbar spine was performed. No intravenous contrast was administered. COMPARISON:  Radiography 08/25/2015.  CT 01/22/2015. FINDINGS: Segmentation:  5 lumbar type vertebral bodies. Alignment:  Mild curvature convex to the right. Vertebrae: No primary bone lesion. Chronic an acute discogenic changes of the endplates at Z6-1. Conus medullaris: Extends to the L2 level and appears normal. Paraspinal and other soft tissues: Stone noted in the common bile duct. Disc levels: L1-2: Shallow central disc  herniation indents the ventral subarachnoid space but does not cause neural compression. L2-3: Bulging of the disc. Mild facet degeneration and hypertrophy. Small amount of extruded disc material in the left lateral recess. Narrowing of the left lateral recess without definite neural compression. L3-4: Shallow protrusion of the disc more towards the left. Facet and ligamentous hypertrophy. Mild stenosis of the left lateral recess but without definite neural compression. L4-5: Advanced chronic disc degeneration with discogenic changes of the endplates including some edema. Circumferential osteophytes and bulging disc material. Facet and ligamentous hypertrophy bilaterally. Lateral recess and foraminal stenosis right worse than left. Neural compression could occur, particularly on the right L5-S1: Mild bulging of the disc. Bilateral facet arthropathy with gaping, fluid-filled facet joints. No central canal stenosis. Mild foraminal narrowing left more than right. IMPRESSION: Probably non-compressive degenerative changes at L1-2, L2-3 and L3-4 as outlined above. L4-5: Advanced chronic disc degeneration with chronic endplate marrow changes, with some edema. This could certainly be associated with back pain. Multifactorial stenosis of the lateral recesses and foramina right more than  left. Neural compression could occur, particularly affecting the exiting right L4 nerve root. L5-S1: Bilateral facet arthropathy with gaping, fluid-filled facet joints. Foraminal narrowing on the left that could possibly affect the L5 nerve root. This appearance could worsen with standing or flexion based on the fat-sat morphology. Electronically Signed: By: Nelson Chimes M.D. On: 12/27/2015 11:15     Assessment & Plan:  Plan  I am having Deanna Shields start on ALPRAZolam. I am also having her maintain her aspirin, Calcium Citrate (CITRACAL PO), amLODipine, tiZANidine, losartan, tiZANidine, omeprazole, methocarbamol, and  chlorpheniramine-HYDROcodone.  Meds ordered this encounter  Medications  . chlorpheniramine-HYDROcodone (TUSSIONEX PENNKINETIC ER) 10-8 MG/5ML SUER    Sig: Take 5 mLs by mouth every 12 (twelve) hours as needed for cough.    Dispense:  140 mL    Refill:  0  . ALPRAZolam (XANAX) 0.25 MG tablet    Sig: Take 1 tablet (0.25 mg total) by mouth 3 (three) times daily as needed for anxiety.    Dispense:  30 tablet    Refill:  0    Problem List Items Addressed This Visit      Unprioritized   COUGH, CHRONIC   Relevant Medications   chlorpheniramine-HYDROcodone (TUSSIONEX PENNKINETIC ER) 10-8 MG/5ML SUER   Essential hypertension    Stable  con't norvasc      Relevant Orders   Comprehensive metabolic panel   Lipid panel    Other Visit Diagnoses    Situational stress    -  Primary   Relevant Medications   ALPRAZolam (XANAX) 0.25 MG tablet   Hyperlipidemia, unspecified hyperlipidemia type          Follow-up: Return in about 6 months (around 11/08/2016) for annual exam, fasting.  [name], DO

## 2016-05-10 NOTE — Patient Instructions (Signed)
Complicated Grieving Introduction Grief is a normal response to the death of someone close to you. Feelings of fear, anger, and guilt can affect almost everyone who loses a loved one. It is also common to have symptoms of depression while you are grieving. These include problems with sleep, loss of appetite, and lack of energy. They may last for weeks or months after a loss. Complicated grief is different from normal grief or depression. Normal grieving involves sadness and feelings of loss, but these feelings are not constant. Complicated grief is a constant and severe type of grief. It interferes with your ability to function normally. It may last for several months to a year or longer. Complicated grief may require treatment from a mental health care provider. What are the causes? It is not known why some people continue to struggle with grief and others do not. You may be at higher risk for complicated grief if:  The death of your loved one was sudden or unexpected.  The death of your loved one was due to a violent event.  Your loved one committed suicide.  Your loved one was a child or a young person.  You were very close to or dependent on the loved one.  You have a history of depression. What are the signs or symptoms? Signs and symptoms of complicated grief may include:  Feeling disbelief or numbness.  Being unable to enjoy good memories of your loved one.  Needing to avoid anything that reminds you of your loved one.  Being unable to stop thinking about the death.  Feeling intense anger or guilt.  Feeling alone and hopeless.  Feeling that your life is meaningless and empty.  Losing the desire to live. How is this diagnosed? Your health care provider may diagnose complicated grief if:  You have constant symptoms of grief for 6-12 months or longer.  Your symptoms are interfering with your ability to live your life. Your health care provider may want you to see a  mental health care provider. Many symptoms of depression are similar to the symptoms of complicated grief. It is important to be evaluated for complicated grief along with other mental health conditions. How is this treated? Talk therapy with a mental health provider is the most common treatment for complicated grief. During therapy, you will learn healthy ways to cope with the loss of your loved one. In some cases, your mental health care provider may also recommend antidepressant medicines. Follow these instructions at home:  Take care of yourself.  Eat regular meals and maintain a healthy diet. Eat plenty of fruits, vegetables, and whole grains.  Try to get some exercise each day.  Keep regular hours for sleep. Try to get at least 8 hours of sleep each night.  Do not use drugs or alcohol to ease your symptoms.  Take medicines only as directed by your health care provider.  Spend time with friends and loved ones.  Consider joining a grief (bereavement) support group to help you deal with your loss.  Keep all follow-up visits as directed by your health care provider. This is important. Contact a health care provider if:  Your symptoms keep you from functioning normally.  Your symptoms do not get better with treatment. Get help right away if:  You have serious thoughts of hurting yourself or someone else.  You have suicidal feelings. This information is not intended to replace advice given to you by your health care provider. Make sure you discuss any  questions you have with your health care provider. Document Released: 05/24/2005 Document Revised: 10/30/2015 Document Reviewed: 11/01/2013  2017 Elsevier

## 2016-05-15 NOTE — Assessment & Plan Note (Signed)
Stable  con't norvasc

## 2016-05-18 ENCOUNTER — Other Ambulatory Visit (INDEPENDENT_AMBULATORY_CARE_PROVIDER_SITE_OTHER): Payer: Medicare Other

## 2016-05-18 DIAGNOSIS — I10 Essential (primary) hypertension: Secondary | ICD-10-CM | POA: Diagnosis not present

## 2016-05-18 LAB — LIPID PANEL
Cholesterol: 235 mg/dL — ABNORMAL HIGH (ref 0–200)
HDL: 62.3 mg/dL (ref >39.00)
LDL Cholesterol: 150 mg/dL — ABNORMAL HIGH (ref 0–99)
NONHDL: 172.51
TRIGLYCERIDES: 115 mg/dL (ref 0.0–149.0)
Total CHOL/HDL Ratio: 4
VLDL: 23 mg/dL (ref 0.0–40.0)

## 2016-05-18 LAB — COMPREHENSIVE METABOLIC PANEL
ALK PHOS: 87 U/L (ref 39–117)
ALT: 19 U/L (ref 0–35)
AST: 22 U/L (ref 0–37)
Albumin: 4 g/dL (ref 3.5–5.2)
BILIRUBIN TOTAL: 0.5 mg/dL (ref 0.2–1.2)
BUN: 19 mg/dL (ref 6–23)
CALCIUM: 9.6 mg/dL (ref 8.4–10.5)
CO2: 29 mEq/L (ref 19–32)
Chloride: 108 mEq/L (ref 96–112)
Creatinine, Ser: 1.06 mg/dL (ref 0.40–1.20)
GFR: 52.28 mL/min — AB (ref >60.00)
GLUCOSE: 100 mg/dL — AB (ref 70–99)
POTASSIUM: 5 meq/L (ref 3.5–5.1)
Sodium: 144 mEq/L (ref 135–145)
TOTAL PROTEIN: 6.6 g/dL (ref 6.0–8.3)

## 2016-05-26 DIAGNOSIS — H35371 Puckering of macula, right eye: Secondary | ICD-10-CM | POA: Diagnosis not present

## 2016-05-31 ENCOUNTER — Emergency Department (HOSPITAL_COMMUNITY): Payer: Medicare Other

## 2016-05-31 ENCOUNTER — Inpatient Hospital Stay (HOSPITAL_COMMUNITY)
Admission: EM | Admit: 2016-05-31 | Discharge: 2016-06-04 | DRG: 195 | Disposition: A | Discharge status: Home / Self Care | Payer: Medicare Other | Attending: Internal Medicine | Admitting: Internal Medicine

## 2016-05-31 ENCOUNTER — Encounter (HOSPITAL_COMMUNITY): Payer: Self-pay | Admitting: *Deleted

## 2016-05-31 DIAGNOSIS — R1110 Vomiting, unspecified: Secondary | ICD-10-CM | POA: Diagnosis not present

## 2016-05-31 DIAGNOSIS — Z885 Allergy status to narcotic agent status: Secondary | ICD-10-CM

## 2016-05-31 DIAGNOSIS — G8929 Other chronic pain: Secondary | ICD-10-CM | POA: Diagnosis present

## 2016-05-31 DIAGNOSIS — F419 Anxiety disorder, unspecified: Secondary | ICD-10-CM | POA: Diagnosis not present

## 2016-05-31 DIAGNOSIS — K219 Gastro-esophageal reflux disease without esophagitis: Secondary | ICD-10-CM | POA: Diagnosis present

## 2016-05-31 DIAGNOSIS — Z7982 Long term (current) use of aspirin: Secondary | ICD-10-CM

## 2016-05-31 DIAGNOSIS — J181 Lobar pneumonia, unspecified organism: Secondary | ICD-10-CM

## 2016-05-31 DIAGNOSIS — R112 Nausea with vomiting, unspecified: Secondary | ICD-10-CM | POA: Diagnosis present

## 2016-05-31 DIAGNOSIS — K8070 Calculus of gallbladder and bile duct without cholecystitis without obstruction: Secondary | ICD-10-CM | POA: Diagnosis present

## 2016-05-31 DIAGNOSIS — R7401 Elevation of levels of liver transaminase levels: Secondary | ICD-10-CM

## 2016-05-31 DIAGNOSIS — H9190 Unspecified hearing loss, unspecified ear: Secondary | ICD-10-CM | POA: Diagnosis present

## 2016-05-31 DIAGNOSIS — R740 Nonspecific elevation of levels of transaminase and lactic acid dehydrogenase [LDH]: Secondary | ICD-10-CM

## 2016-05-31 DIAGNOSIS — R197 Diarrhea, unspecified: Secondary | ICD-10-CM | POA: Diagnosis not present

## 2016-05-31 DIAGNOSIS — Z8249 Family history of ischemic heart disease and other diseases of the circulatory system: Secondary | ICD-10-CM

## 2016-05-31 DIAGNOSIS — A084 Viral intestinal infection, unspecified: Secondary | ICD-10-CM | POA: Diagnosis present

## 2016-05-31 DIAGNOSIS — Z91041 Radiographic dye allergy status: Secondary | ICD-10-CM

## 2016-05-31 DIAGNOSIS — R030 Elevated blood-pressure reading, without diagnosis of hypertension: Secondary | ICD-10-CM | POA: Diagnosis not present

## 2016-05-31 DIAGNOSIS — K8050 Calculus of bile duct without cholangitis or cholecystitis without obstruction: Secondary | ICD-10-CM

## 2016-05-31 DIAGNOSIS — K449 Diaphragmatic hernia without obstruction or gangrene: Secondary | ICD-10-CM | POA: Diagnosis present

## 2016-05-31 DIAGNOSIS — J189 Pneumonia, unspecified organism: Secondary | ICD-10-CM | POA: Diagnosis not present

## 2016-05-31 DIAGNOSIS — I10 Essential (primary) hypertension: Secondary | ICD-10-CM | POA: Diagnosis not present

## 2016-05-31 DIAGNOSIS — Z823 Family history of stroke: Secondary | ICD-10-CM

## 2016-05-31 DIAGNOSIS — J154 Pneumonia due to other streptococci: Secondary | ICD-10-CM | POA: Diagnosis not present

## 2016-05-31 DIAGNOSIS — R1111 Vomiting without nausea: Secondary | ICD-10-CM | POA: Diagnosis not present

## 2016-05-31 LAB — COMPREHENSIVE METABOLIC PANEL
ALT: 71 U/L — ABNORMAL HIGH (ref 14–54)
ANION GAP: 9 (ref 5–15)
AST: 98 U/L — ABNORMAL HIGH (ref 15–41)
Albumin: 4.2 g/dL (ref 3.5–5.0)
Alkaline Phosphatase: 143 U/L — ABNORMAL HIGH (ref 38–126)
BILIRUBIN TOTAL: 0.5 mg/dL (ref 0.3–1.2)
BUN: 17 mg/dL (ref 6–20)
CO2: 24 mmol/L (ref 22–32)
Calcium: 9.1 mg/dL (ref 8.9–10.3)
Chloride: 105 mmol/L (ref 101–111)
Creatinine, Ser: 1.2 mg/dL — ABNORMAL HIGH (ref 0.44–1.00)
GFR, EST AFRICAN AMERICAN: 46 mL/min — AB (ref >60)
GFR, EST NON AFRICAN AMERICAN: 40 mL/min — AB (ref >60)
Glucose, Bld: 126 mg/dL — ABNORMAL HIGH (ref 65–99)
POTASSIUM: 4 mmol/L (ref 3.5–5.1)
Sodium: 138 mmol/L (ref 135–145)
Total Protein: 7.1 g/dL (ref 6.5–8.1)

## 2016-05-31 LAB — CBC WITH DIFFERENTIAL/PLATELET
Basophils Absolute: 0 10*3/uL (ref 0.0–0.1)
Basophils Relative: 0 %
EOS PCT: 1 %
Eosinophils Absolute: 0.2 10*3/uL (ref 0.0–0.7)
HEMATOCRIT: 41.6 % (ref 36.0–46.0)
Hemoglobin: 13.8 g/dL (ref 12.0–15.0)
LYMPHS PCT: 6 %
Lymphs Abs: 1.1 10*3/uL (ref 0.7–4.0)
MCH: 29.8 pg (ref 26.0–34.0)
MCHC: 33.2 g/dL (ref 30.0–36.0)
MCV: 89.8 fL (ref 78.0–100.0)
MONO ABS: 0.9 10*3/uL (ref 0.1–1.0)
MONOS PCT: 5 %
NEUTROS ABS: 16.6 10*3/uL — AB (ref 1.7–7.7)
Neutrophils Relative %: 88 %
Platelets: 299 10*3/uL (ref 150–400)
RBC: 4.63 MIL/uL (ref 3.87–5.11)
RDW: 15.2 % (ref 11.5–15.5)
WBC: 18.8 10*3/uL — ABNORMAL HIGH (ref 4.0–10.5)

## 2016-05-31 MED ORDER — LOPERAMIDE HCL 2 MG PO CAPS
2.0000 mg | ORAL_CAPSULE | ORAL | Status: DC | PRN
  Filled 2016-05-31: qty 1

## 2016-05-31 MED ORDER — PANTOPRAZOLE SODIUM 40 MG PO TBEC
40.0000 mg | DELAYED_RELEASE_TABLET | Freq: Every day | ORAL | Status: DC
  Administered 2016-06-04: 10:00:00 40 mg via ORAL
  Filled 2016-05-31 (×3): qty 1

## 2016-05-31 MED ORDER — AMLODIPINE BESYLATE 5 MG PO TABS
2.5000 mg | ORAL_TABLET | Freq: Every day | ORAL | Status: DC
  Administered 2016-06-01 – 2016-06-04 (×4): 2.5 mg via ORAL
  Filled 2016-05-31 (×4): qty 1

## 2016-05-31 MED ORDER — ONDANSETRON HCL 4 MG/2ML IJ SOLN: 4.0000 mg | Freq: Four times a day (QID) | INTRAMUSCULAR | Status: DC | PRN

## 2016-05-31 MED ORDER — ENOXAPARIN SODIUM 40 MG/0.4ML ~~LOC~~ SOLN
40.0000 mg | Freq: Every day | SUBCUTANEOUS | Status: DC
  Administered 2016-06-01 – 2016-06-03 (×4): 40 mg via SUBCUTANEOUS
  Filled 2016-05-31 (×4): qty 0.4

## 2016-05-31 MED ORDER — METHOCARBAMOL 500 MG PO TABS
500.0000 mg | ORAL_TABLET | Freq: Four times a day (QID) | ORAL | Status: DC | PRN
  Administered 2016-06-01: 500 mg via ORAL
  Filled 2016-05-31: qty 1

## 2016-05-31 MED ORDER — DEXTROSE 5 % IV SOLN
500.0000 mg | INTRAVENOUS | Status: AC
  Administered 2016-05-31 – 2016-06-02 (×3): 500 mg via INTRAVENOUS
  Filled 2016-05-31 (×3): qty 500

## 2016-05-31 MED ORDER — HYDROCOD POLST-CPM POLST ER 10-8 MG/5ML PO SUER
5.0000 mL | Freq: Two times a day (BID) | ORAL | Status: DC | PRN
  Administered 2016-06-02 – 2016-06-04 (×3): 5 mL via ORAL
  Filled 2016-05-31 (×3): qty 5

## 2016-05-31 MED ORDER — DEXTROSE 5 % IV SOLN
1.0000 g | INTRAVENOUS | Status: DC
  Administered 2016-05-31 – 2016-06-03 (×4): 1 g via INTRAVENOUS
  Filled 2016-05-31 (×5): qty 10

## 2016-05-31 MED ORDER — ALPRAZOLAM 0.25 MG PO TABS
0.2500 mg | ORAL_TABLET | Freq: Three times a day (TID) | ORAL | Status: DC | PRN
  Administered 2016-06-01: 0.25 mg via ORAL
  Filled 2016-05-31: qty 1

## 2016-05-31 MED ORDER — SODIUM CHLORIDE 0.9 % IV BOLUS (SEPSIS)
1000.0000 mL | Freq: Once | INTRAVENOUS | Status: AC
  Administered 2016-05-31: 1000 mL via INTRAVENOUS

## 2016-05-31 MED ORDER — ASPIRIN EC 81 MG PO TBEC
81.0000 mg | DELAYED_RELEASE_TABLET | Freq: Every day | ORAL | Status: DC
  Administered 2016-06-01 – 2016-06-04 (×4): 81 mg via ORAL
  Filled 2016-05-31 (×4): qty 1

## 2016-05-31 MED ORDER — DEXTROSE 5 % IV SOLN: 1.0000 g | INTRAVENOUS | Status: DC

## 2016-05-31 MED ORDER — SODIUM CHLORIDE 0.9 % IV SOLN
INTRAVENOUS | Status: DC
  Administered 2016-06-01: 125 mL/h via INTRAVENOUS
  Administered 2016-06-01 (×2): via INTRAVENOUS

## 2016-05-31 MED ORDER — LOSARTAN POTASSIUM 50 MG PO TABS
50.0000 mg | ORAL_TABLET | Freq: Every day | ORAL | Status: DC
  Administered 2016-06-01 – 2016-06-04 (×4): 50 mg via ORAL
  Filled 2016-05-31 (×4): qty 1

## 2016-05-31 MED ORDER — AZITHROMYCIN 250 MG PO TABS: 500.0000 mg | ORAL_TABLET | ORAL | Status: DC

## 2016-05-31 MED ORDER — ONDANSETRON HCL 4 MG/2ML IJ SOLN
4.0000 mg | Freq: Once | INTRAMUSCULAR | Status: AC
  Administered 2016-05-31: 4 mg via INTRAVENOUS
  Filled 2016-05-31: qty 2

## 2016-05-31 NOTE — H&P (Signed)
History and Physical    [place] DGU:[id number] DOB: June 12, 1928 DOA: 05/31/2016   PCP: [name], DO Chief Complaint: No chief complaint on file.   HPI: Deanna Shields is a 82 y.o. female with medical history significant of HTN.  Patient presents to the ED with 2 week history of cough and congestion, and 1 day history of N/V/D.  N/V/D onset 4 hours PTA.  No fever nor chills.  Diarrhea is watery, vomiting is NBNB.  No sick contacts.  Lives alone at baseline.  Has tried nothing for symptoms, nothing makes better or worse.  ED Course: RML PNA on CT abd/pelvis.  Review of Systems: As per HPI otherwise 10 point review of systems negative.    Past Medical History:  Diagnosis Date  . Arthritis    "right thumb" (01/29/2015)  . Chronic shoulder pain    "between my shoulders" (01/29/2015)  . Dizziness   . Esophageal stricture 2009  . Gallstones   . GERD (gastroesophageal reflux disease) 2004  . Hemorrhoids 2004  . Hiatal hernia 2004  . History of blood transfusion 1947   "appendix ruptured"  . HOH (hard of hearing)   . Hypertension   . Rhinitis, allergic   . Syncopal episodes     Past Surgical History:  Procedure Laterality Date  . APPENDECTOMY  1947   "ruptured"  . CARDIAC CATHETERIZATION  10/23/1999; 09/2014   EF 60% No significant CAD; patent coronary arteries.  . CARPAL TUNNEL RELEASE Right 1990's  . CHOLECYSTECTOMY N/A 01/29/2015   Procedure: LAPAROSCOPIC CHOLECYSTECTOMY;  Surgeon: Rolm Bookbinder, MD;  Location: [facility];  Service: General;  Laterality: N/A;  . DILATION AND CURETTAGE OF UTERUS  "couple"  . ESOPHAGOGASTRODUODENOSCOPY (EGD) WITH ESOPHAGEAL DILATION  2-3 times  . HEMORROIDECTOMY    . KNEE ARTHROSCOPY Bilateral    right x 2  . LAPAROSCOPIC CHOLECYSTECTOMY  01/29/2015  . LEFT HEART CATHETERIZATION WITH CORONARY ANGIOGRAM N/A 09/16/2014   Procedure: LEFT HEART CATHETERIZATION WITH CORONARY ANGIOGRAM;  Surgeon: [name], MD;  Location:  [facility] CATH LAB;  Service: Cardiovascular;  Laterality: N/A;  . NM MYOVIEW LTD  06/01/2012   EF 71%  . VAGINAL HYSTERECTOMY  1970's     reports that she has never smoked. She has never used smokeless tobacco. She reports that she does not drink alcohol or use drugs.  Allergies  Allergen Reactions  . Contrast Media [Iodinated Diagnostic Agents] Itching and Rash    Pt covered in rash, red.  . Codeine Nausea And Vomiting    in large doses: can tolerate Tussionex    Family History  Problem Relation Age of Onset  . Stroke Father 22    light stroke  . Heart disease Sister   . Coronary artery disease Neg Hx   . Diabetes Neg Hx   . Cancer Neg Hx     breast, colon, prostate      Prior to Admission medications   Medication Sig Start Date End Date Taking? Authorizing Provider  ALPRAZolam (XANAX) 0.25 MG tablet Take 1 tablet (0.25 mg total) by mouth 3 (three) times daily as needed for anxiety. 05/10/16  Yes Yvonne R Lowne Chase, DO  amLODipine (NORVASC) 2.5 MG tablet Take 1 tablet (2.5 mg total) by mouth daily. 08/27/15  Yes [name], MD  aspirin 81 MG tablet Take 81 mg by mouth daily.    Yes Historical Provider, MD  Calcium Citrate (CITRACAL PO) Take 1 tablet by mouth daily.  Yes Historical Provider, MD  chlorpheniramine-HYDROcodone (TUSSIONEX PENNKINETIC ER) 10-8 MG/5ML SUER Take 5 mLs by mouth every 12 (twelve) hours as needed for cough. 05/10/16  Yes Yvonne R Lowne Chase, DO  losartan (COZAAR) 50 MG tablet Take 1 tablet (50 mg total) by mouth daily. 12/25/15  Yes Yvonne R Lowne Chase, DO  methocarbamol (ROBAXIN) 500 MG tablet Take 1 tablet (500 mg total) by mouth 4 (four) times daily as needed for muscle spasms. 04/20/16  Yes Yvonne R Lowne Chase, DO  omeprazole (PRILOSEC) 40 MG capsule TAKE 1 CAPSULE DAILY Patient taking differently: TAKE 40 mg CAPSULE by mouth once DAILY as needed for heartburn 04/13/16  Yes [name], DO    Physical Exam: Vitals:   05/31/16 1819  05/31/16 1825 05/31/16 2042  BP:  141/73 145/72  Pulse:  104 87  Resp:  18 20  Temp:  97.4 F (36.3 C)   TempSrc:  Oral   SpO2: 97% 97% 95%      Constitutional: NAD, calm, comfortable Eyes: PERRL, lids and conjunctivae normal ENMT: Mucous membranes are moist. Posterior pharynx clear of any exudate or lesions.Normal dentition.  Neck: normal, supple, no masses, no thyromegaly Respiratory: clear to auscultation bilaterally, no wheezing, no crackles. Normal respiratory effort. No accessory muscle use.  Cardiovascular: Regular rate and rhythm, no murmurs / rubs / gallops. No extremity edema. 2+ pedal pulses. No carotid bruits.  Abdomen: no tenderness, no masses palpated. No hepatosplenomegaly. Bowel sounds positive.  Musculoskeletal: no clubbing / cyanosis. No joint deformity upper and lower extremities. Good ROM, no contractures. Normal muscle tone.  Skin: no rashes, lesions, ulcers. No induration Neurologic: CN 2-12 grossly intact. Sensation intact, DTR normal. Strength 5/5 in all 4.  Psychiatric: Normal judgment and insight. Alert and oriented x 3. Normal mood.    Labs on Admission: I have personally reviewed following labs and imaging studies  CBC:  Recent Labs Lab 05/31/16 1913  WBC 18.8*  NEUTROABS 16.6*  HGB 13.8  HCT 41.6  MCV 89.8  PLT 149   Basic Metabolic Panel:  Recent Labs Lab 05/31/16 1913  NA 138  K 4.0  CL 105  CO2 24  GLUCOSE 126*  BUN 17  CREATININE 1.20*  CALCIUM 9.1   GFR: CrCl cannot be calculated (Unknown ideal weight.). Liver Function Tests:  Recent Labs Lab 05/31/16 1913  AST 98*  ALT 71*  ALKPHOS 143*  BILITOT 0.5  PROT 7.1  ALBUMIN 4.2   No results for input(s): LIPASE, AMYLASE in the last 168 hours. No results for input(s): AMMONIA in the last 168 hours. Coagulation Profile: No results for input(s): INR, PROTIME in the last 168 hours. Cardiac Enzymes: No results for input(s): CKTOTAL, CKMB, CKMBINDEX, TROPONINI in the last  168 hours. BNP (last 3 results) No results for input(s): PROBNP in the last 8760 hours. HbA1C: No results for input(s): HGBA1C in the last 72 hours. CBG: No results for input(s): GLUCAP in the last 168 hours. Lipid Profile: No results for input(s): CHOL, HDL, LDLCALC, TRIG, CHOLHDL, LDLDIRECT in the last 72 hours. Thyroid Function Tests: No results for input(s): TSH, T4TOTAL, FREET4, T3FREE, THYROIDAB in the last 72 hours. Anemia Panel: No results for input(s): VITAMINB12, FOLATE, FERRITIN, TIBC, IRON, RETICCTPCT in the last 72 hours. Urine analysis:    Component Value Date/Time   COLORURINE YELLOW 10/28/2013 2000   APPEARANCEUR CLOUDY (A) 10/28/2013 2000   LABSPEC 1.011 10/28/2013 2000   PHURINE 7.0 10/28/2013 2000   GLUCOSEU NEGATIVE 10/28/2013 2000  HGBUR NEGATIVE 10/28/2013 2000   HGBUR negative 06/04/2008 1606   BILIRUBINUR neg 08/25/2015 1217   KETONESUR NEGATIVE 10/28/2013 2000   PROTEINUR neg 08/25/2015 1217   PROTEINUR NEGATIVE 10/28/2013 2000   UROBILINOGEN 1.0 08/25/2015 1217   UROBILINOGEN 0.2 10/28/2013 2000   NITRITE neg 08/25/2015 1217   NITRITE NEGATIVE 10/28/2013 2000   LEUKOCYTESUR Negative 08/25/2015 1217   Sepsis Labs: @LABRCNTIP (procalcitonin:4,lacticidven:4) )No results found for this or any previous visit (from the past 240 hour(s)).   Radiological Exams on Admission: Ct Abdomen Pelvis Wo Contrast  Result Date: 05/31/2016 CLINICAL DATA:  Vomiting today. EXAM: CT ABDOMEN AND PELVIS WITHOUT CONTRAST TECHNIQUE: Multidetector CT imaging of the abdomen and pelvis was performed following the standard protocol without IV contrast. COMPARISON:  CT abdomen/ pelvis 01/22/2015, lumbar spine MRI 12/27/2015 FINDINGS: Lower chest: Patchy and ground-glass airspace disease within the right middle lobe is partially included. Linear opacities in the dependent lower lobes. No pleural fluid. 6 mm left lower lobe nodule image 21 series 6 is unchanged allowing for  differences in caliper placement. This is an stable dating back to 2014 and considered benign. Hepatobiliary: No focal abnormality allowing for lack contrast. Surgical clips in the gallbladder fossa postcholecystectomy. Dilatation of the common bile duct measuring 11 mm at the porta hepatis. High-density material is seen within the distal common bile duct, consistent with choledocholithiasis. Pancreas: Parenchymal atrophy. No ductal dilatation or inflammation. Spleen: Normal in size without focal abnormality. Adrenals/Urinary Tract: Mild thinning of the renal parenchyma, no hydronephrosis. No perinephric edema. No urolithiasis. Both ureters are decompressed. Urinary bladder is physiologically distended. Stomach/Bowel: Small hiatal hernia with distal esophageal thickening. Stomach is physiologically distended. Enteric contrast has not yet exited the stomach. No small bowel dilatation or inflammation. Moderate colonic stool burden without colonic inflammation. There a few scattered colonic diverticular distally. The appendix is not visualized. Vascular/Lymphatic: Aortic atherosclerosis. No enlarged abdominal or pelvic lymph nodes. Reproductive: Status post hysterectomy. No adnexal masses. Other: Fat containing umbilical hernia, mild increase in size from prior CT, no bowel involvement. There is fat within both inguinal canals, right greater than left, unchanged. No free air or free fluid in the abdomen or pelvis. Musculoskeletal: Suspect subacute mild L3 superior endplate compression fracture with ill-defined scleroses. Degenerative disc disease at L4-L5 with vacuum phenomenon. IMPRESSION: 1. Small hiatal hernia with distal esophageal thickening, suspect esophagitis. 2. Partially calcified stones in the distal common bile duct, at least one of which was present on prior lumbar spine MRI July 2017. Mild proximal biliary dilatation. Post cholecystectomy. 3. Patchy and ground-glass airspace disease in the right middle  lobe, partially included, suspicious for pneumonia. 4. Probable subacute minimal L3 superior endplate compression fracture. 5. Fat containing umbilical hernia, mildly increased in size. 6. Abdominal atherosclerosis. Electronically Signed   By: Jeb Levering M.D.   On: 05/31/2016 22:25    EKG: Independently reviewed.  Assessment/Plan Principal Problem:   Right middle lobe pneumonia (HCC) Active Problems:   Essential hypertension   CAP (community acquired pneumonia)   Nausea vomiting and diarrhea    1. RML CAP - 1. PNA pathway 2. Rocephin / azithromycin 3. Will observe overnight 4. Cultures pending 2. N/V/D - symptomatic treatment with zofran and imodium. 3. HTN - continue home meds   DVT prophylaxis: Lovenox Code Status: Full Family Communication: Family at bedside Consults called: None Admission status: Place in [place], [place] Hospital[facility] Pager [phone] from 7PM-7AM  If 7AM-7PM, please contact the day physician for  the patient [email or web address] Password [facility]  05/31/2016, 11:17 PM

## 2016-05-31 NOTE — ED Notes (Signed)
Bed: BM21 Expected date:  Expected time:  Means of arrival:  Comments: EMS-N/V/D

## 2016-05-31 NOTE — ED Triage Notes (Signed)
Per EMS, patient from [facility], c/o N/V/D x1 day. Patient reports abdominal and leg cramping. Denies chest pain and SOB. A&Ox4.

## 2016-05-31 NOTE — ED Provider Notes (Signed)
[place] DEPT Provider Note   CSN: [id number] Arrival date & time: 05/31/16  1812     History   Chief Complaint No chief complaint on file.   HPI Deanna Shields is a 82 y.o. female.  82 year old female presents with acute onset of nausea vomiting diarrhea which began 4 hours prior to arrival. Denies any fever or chills. Diarrhea has been watery in emesis is nonbilious. She denies abdominal pain distention. States that she had vomiting initially. No urinary symptoms. Felt fine when she got this morning. No cough or congestion or dyspnea. Is unsure of sick exposures and possibly could've been exposed to bad food.      Past Medical History:  Diagnosis Date  . Arthritis    "right thumb" (01/29/2015)  . Chronic shoulder pain    "between my shoulders" (01/29/2015)  . Dizziness   . Esophageal stricture 2009  . Gallstones   . GERD (gastroesophageal reflux disease) 2004  . Hemorrhoids 2004  . Hiatal hernia 2004  . History of blood transfusion 1947   "appendix ruptured"  . HOH (hard of hearing)   . Hypertension   . Rhinitis, allergic   . Syncopal episodes     Patient Active Problem List   Diagnosis Date Noted  . S/P laparoscopic cholecystectomy 01/29/2015  . Pain in the chest   . Abnormal nuclear stress test   . Allergic rhinitis 07/12/2014  . Viral stomatitis 07/05/2014  . Syncope 11/10/2013  . Hyponatremia 11/10/2013  . Chest pain 10/28/2013  . Dizziness 10/19/2013  . Near syncope 10/19/2013  . Leg cramps 12/25/2012  . Bilateral claudication of lower limb ([place]) 12/25/2012  . Stress reaction 09/15/2012  . Hearing loss of right ear 07/31/2012  . Retinal tear 11/03/2011  . ABDOMINAL PAIN, RIGHT UPPER QUADRANT 06/18/2010  . TINEA CRURIS 10/29/2009  . CERUMEN IMPACTION, RIGHT 07/28/2009  . NECK PAIN, RIGHT 07/28/2009  . DIZZINESS 07/28/2009  . UNSPECIFIED VITAMIN D DEFICIENCY 06/16/2009  . B12 DEFICIENCY 04/01/2009  . Shortness of breath 02/03/2009  .  DYSURIA 06/04/2008  . INTERNAL HEMORRHOIDS 04/22/2008  . HIATAL HERNIA 04/22/2008  . GERD 03/21/2008  . Allergic rhinitis, cause unspecified 08/10/2007  . OSTEOPENIA 03/22/2007  . BACKACHE NOS 02/23/2007  . COUGH, CHRONIC 02/23/2007  . Essential hypertension 11/17/2006  . DEPENDENT EDEMA 11/17/2006    Past Surgical History:  Procedure Laterality Date  . APPENDECTOMY  1947   "ruptured"  . CARDIAC CATHETERIZATION  10/23/1999; 09/2014   EF 60% No significant CAD; patent coronary arteries.  . CARPAL TUNNEL RELEASE Right 1990's  . CHOLECYSTECTOMY N/A 01/29/2015   Procedure: LAPAROSCOPIC CHOLECYSTECTOMY;  Surgeon: Rolm Bookbinder, MD;  Location: [facility];  Service: General;  Laterality: N/A;  . DILATION AND CURETTAGE OF UTERUS  "couple"  . ESOPHAGOGASTRODUODENOSCOPY (EGD) WITH ESOPHAGEAL DILATION  2-3 times  . HEMORROIDECTOMY    . KNEE ARTHROSCOPY Bilateral    right x 2  . LAPAROSCOPIC CHOLECYSTECTOMY  01/29/2015  . LEFT HEART CATHETERIZATION WITH CORONARY ANGIOGRAM N/A 09/16/2014   Procedure: LEFT HEART CATHETERIZATION WITH CORONARY ANGIOGRAM;  Surgeon: [name], MD;  Location[facility] C[facility] LAB;  Service: Cardiovascular;  Laterality: N/A;  . NM MYOVIEW LTD  06/01/2012   EF 71%  . VAGINAL HYSTERECTOMY  1970's    OB History    No data available       Home Medications    Prior to Admission medications   Medication Sig Start Date End Date Taking? Authorizing Provider  ALPRAZolam Duanne Moron) 0.25 MG  tablet Take 1 tablet (0.25 mg total) by mouth 3 (three) times daily as needed for anxiety. 05/10/16   Alferd Apa Lowne Chase, DO  amLODipine (NORVASC) 2.5 MG tablet Take 1 tablet (2.5 mg total) by mouth daily. 08/27/15   [name], MD  aspirin 81 MG tablet Take 81 mg by mouth daily.     Historical Provider, MD  Calcium Citrate (CITRACAL PO) Take 1 tablet by mouth daily.    Historical Provider, MD  chlorpheniramine-HYDROcodone (TUSSIONEX PENNKINETIC ER) 10-8 MG/5ML SUER Take 5 mLs by  mouth every 12 (twelve) hours as needed for cough. 05/10/16   Rosalita Chessman Chase, DO  losartan (COZAAR) 50 MG tablet Take 1 tablet (50 mg total) by mouth daily. 12/25/15   Rosalita Chessman Chase, DO  methocarbamol (ROBAXIN) 500 MG tablet Take 1 tablet (500 mg total) by mouth 4 (four) times daily as needed for muscle spasms. 04/20/16   Rosalita Chessman Chase, DO  omeprazole (PRILOSEC) 40 MG capsule TAKE 1 CAPSULE DAILY 04/13/16   Rosalita Chessman Chase, DO  tiZANidine (ZANAFLEX) 4 MG tablet Take 1 tablet (4 mg total) by mouth every 6 (six) hours as needed for muscle spasms. 12/23/15   Rosalita Chessman Chase, DO  tiZANidine (ZANAFLEX) 4 MG tablet TAKE 1 TABLET THREE TIMES A DAY AS NEEDED FOR MUSCLE SPASMS 04/13/16   [name], DO    Family History Family History  Problem Relation Age of Onset  . Stroke Father 59    light stroke  . Heart disease Sister   . Coronary artery disease Neg Hx   . Diabetes Neg Hx   . Cancer Neg Hx     breast, colon, prostate    Social History Social History  Substance Use Topics  . Smoking status: Never Smoker  . Smokeless tobacco: Never Used  . Alcohol use No     Allergies   Contrast media [iodinated diagnostic agents] and Codeine   Review of Systems Review of Systems  All other systems reviewed and are negative.    Physical Exam Updated Vital Signs BP 141/73 (BP Location: Right Arm)   Pulse 104   Temp 97.4 F (36.3 C) (Oral)   Resp 18   SpO2 97%   Physical Exam  Constitutional: She is oriented to person, place, and time. She appears well-developed and well-nourished.  Non-toxic appearance. No distress.  HENT:  Head: Normocephalic and atraumatic.  Eyes: Conjunctivae, EOM and lids are normal. Pupils are equal, round, and reactive to light.  Neck: Normal range of motion. Neck supple. No tracheal deviation present. No thyroid mass present.  Cardiovascular: Normal rate, regular rhythm and normal heart sounds.  Exam reveals no gallop.   No  murmur heard. Pulmonary/Chest: Effort normal and breath sounds normal. No stridor. No respiratory distress. She has no decreased breath sounds. She has no wheezes. She has no rhonchi. She has no rales.  Abdominal: Soft. Normal appearance and bowel sounds are normal. She exhibits no distension. There is no tenderness. There is no rebound and no CVA tenderness.  Musculoskeletal: Normal range of motion. She exhibits no edema or tenderness.  Neurological: She is alert and oriented to person, place, and time. She has normal strength. No cranial nerve deficit or sensory deficit. GCS eye subscore is 4. GCS verbal subscore is 5. GCS motor subscore is 6.  Skin: Skin is warm and dry. No abrasion and no rash noted.  Psychiatric: She has a normal mood and affect.  Her speech is normal and behavior is normal.  Nursing note and vitals reviewed.    ED Treatments / Results  Labs (all labs ordered are listed, but only abnormal results are displayed) Labs Reviewed - No data to display  EKG  EKG Interpretation None       Radiology No results found.  Procedures Procedures (including critical care time)  Medications Ordered in ED Medications - No data to display   Initial Impression / Assessment and Plan / ED Course  I have reviewed the triage vital signs and the nursing notes.  Pertinent labs & imaging results that were available during my care of the patient were reviewed by me and considered in my medical decision making (see chart for details).  Clinical Course     Patient is an increased cough and congestion. She had acute onset of vomiting today. Abdominal CT shows pneumonia on her right lower lobe. Will start on IV antibiotics and admitted for observation  Final Clinical Impressions(s) / ED Diagnoses   Final diagnoses:  None    New Prescriptions New Prescriptions   No medications on file     Lacretia Leigh, MD 05/31/16 2253

## 2016-06-01 ENCOUNTER — Observation Stay (HOSPITAL_COMMUNITY): Payer: Medicare Other

## 2016-06-01 ENCOUNTER — Encounter (HOSPITAL_COMMUNITY): Payer: Self-pay | Admitting: General Practice

## 2016-06-01 DIAGNOSIS — J181 Lobar pneumonia, unspecified organism: Secondary | ICD-10-CM | POA: Diagnosis not present

## 2016-06-01 DIAGNOSIS — F419 Anxiety disorder, unspecified: Secondary | ICD-10-CM | POA: Diagnosis present

## 2016-06-01 DIAGNOSIS — R05 Cough: Secondary | ICD-10-CM | POA: Diagnosis not present

## 2016-06-01 DIAGNOSIS — Z7982 Long term (current) use of aspirin: Secondary | ICD-10-CM | POA: Diagnosis not present

## 2016-06-01 DIAGNOSIS — Z91041 Radiographic dye allergy status: Secondary | ICD-10-CM | POA: Diagnosis not present

## 2016-06-01 DIAGNOSIS — K8050 Calculus of bile duct without cholangitis or cholecystitis without obstruction: Secondary | ICD-10-CM | POA: Diagnosis not present

## 2016-06-01 DIAGNOSIS — J13 Pneumonia due to Streptococcus pneumoniae: Secondary | ICD-10-CM | POA: Diagnosis not present

## 2016-06-01 DIAGNOSIS — K449 Diaphragmatic hernia without obstruction or gangrene: Secondary | ICD-10-CM | POA: Diagnosis present

## 2016-06-01 DIAGNOSIS — K439 Ventral hernia without obstruction or gangrene: Secondary | ICD-10-CM | POA: Diagnosis not present

## 2016-06-01 DIAGNOSIS — Z885 Allergy status to narcotic agent status: Secondary | ICD-10-CM | POA: Diagnosis not present

## 2016-06-01 DIAGNOSIS — R1110 Vomiting, unspecified: Secondary | ICD-10-CM | POA: Diagnosis not present

## 2016-06-01 DIAGNOSIS — G8929 Other chronic pain: Secondary | ICD-10-CM | POA: Diagnosis present

## 2016-06-01 DIAGNOSIS — Z8249 Family history of ischemic heart disease and other diseases of the circulatory system: Secondary | ICD-10-CM | POA: Diagnosis not present

## 2016-06-01 DIAGNOSIS — I10 Essential (primary) hypertension: Secondary | ICD-10-CM | POA: Diagnosis not present

## 2016-06-01 DIAGNOSIS — Z823 Family history of stroke: Secondary | ICD-10-CM | POA: Diagnosis not present

## 2016-06-01 DIAGNOSIS — R740 Nonspecific elevation of levels of transaminase and lactic acid dehydrogenase [LDH]: Secondary | ICD-10-CM | POA: Diagnosis not present

## 2016-06-01 DIAGNOSIS — K8070 Calculus of gallbladder and bile duct without cholecystitis without obstruction: Secondary | ICD-10-CM | POA: Diagnosis present

## 2016-06-01 DIAGNOSIS — K210 Gastro-esophageal reflux disease with esophagitis: Secondary | ICD-10-CM

## 2016-06-01 DIAGNOSIS — J154 Pneumonia due to other streptococci: Secondary | ICD-10-CM | POA: Diagnosis present

## 2016-06-01 DIAGNOSIS — H9190 Unspecified hearing loss, unspecified ear: Secondary | ICD-10-CM | POA: Diagnosis present

## 2016-06-01 DIAGNOSIS — A084 Viral intestinal infection, unspecified: Secondary | ICD-10-CM | POA: Diagnosis present

## 2016-06-01 DIAGNOSIS — K219 Gastro-esophageal reflux disease without esophagitis: Secondary | ICD-10-CM | POA: Diagnosis present

## 2016-06-01 DIAGNOSIS — R112 Nausea with vomiting, unspecified: Secondary | ICD-10-CM | POA: Diagnosis not present

## 2016-06-01 LAB — COMPREHENSIVE METABOLIC PANEL
ALK PHOS: 92 U/L (ref 38–126)
ALT: 44 U/L (ref 14–54)
AST: 51 U/L — AB (ref 15–41)
Albumin: 2.9 g/dL — ABNORMAL LOW (ref 3.5–5.0)
Anion gap: 5 (ref 5–15)
BUN: 14 mg/dL (ref 6–20)
CALCIUM: 7.8 mg/dL — AB (ref 8.9–10.3)
CHLORIDE: 109 mmol/L (ref 101–111)
CO2: 25 mmol/L (ref 22–32)
CREATININE: 0.98 mg/dL (ref 0.44–1.00)
GFR, EST AFRICAN AMERICAN: 59 mL/min — AB (ref >60)
GFR, EST NON AFRICAN AMERICAN: 51 mL/min — AB (ref >60)
Glucose, Bld: 103 mg/dL — ABNORMAL HIGH (ref 65–99)
Potassium: 3.9 mmol/L (ref 3.5–5.1)
Sodium: 139 mmol/L (ref 135–145)
Total Bilirubin: 0.8 mg/dL (ref 0.3–1.2)
Total Protein: 5.1 g/dL — ABNORMAL LOW (ref 6.5–8.1)

## 2016-06-01 LAB — CBC WITH DIFFERENTIAL/PLATELET
BASOS ABS: 0 10*3/uL (ref 0.0–0.1)
Basophils Relative: 0 %
Eosinophils Absolute: 0.2 10*3/uL (ref 0.0–0.7)
Eosinophils Relative: 1 %
HEMATOCRIT: 31.5 % — AB (ref 36.0–46.0)
HEMOGLOBIN: 10.5 g/dL — AB (ref 12.0–15.0)
LYMPHS ABS: 2.1 10*3/uL (ref 0.7–4.0)
LYMPHS PCT: 15 %
MCH: 30.2 pg (ref 26.0–34.0)
MCHC: 33.3 g/dL (ref 30.0–36.0)
MCV: 90.5 fL (ref 78.0–100.0)
Monocytes Absolute: 0.7 10*3/uL (ref 0.1–1.0)
Monocytes Relative: 5 %
NEUTROS ABS: 10.9 10*3/uL — AB (ref 1.7–7.7)
NEUTROS PCT: 79 %
PLATELETS: 254 10*3/uL (ref 150–400)
RBC: 3.48 MIL/uL — AB (ref 3.87–5.11)
RDW: 15.4 % (ref 11.5–15.5)
WBC: 13.8 10*3/uL — AB (ref 4.0–10.5)

## 2016-06-01 LAB — MAGNESIUM: MAGNESIUM: 1.9 mg/dL (ref 1.7–2.4)

## 2016-06-01 LAB — STREP PNEUMONIAE URINARY ANTIGEN: STREP PNEUMO URINARY ANTIGEN: POSITIVE — AB

## 2016-06-01 LAB — PHOSPHORUS: PHOSPHORUS: 2.1 mg/dL — AB (ref 2.5–4.6)

## 2016-06-01 NOTE — Progress Notes (Signed)
To Xray via wc accompanied by transporter

## 2016-06-01 NOTE — Progress Notes (Signed)
PROGRESS NOTE    Deanna  IEP:[id number] DOB: 11-06-1928 DOA: 05/31/2016 PCP: [name], DO  Brief Narrative: Deanna Shields is a 82 y.o. female with medical history significant of HTN.  Patient presents to the ED with 2 week history of cough and congestion, and 1 day history of N/V/D.  N/V/D onset 4 hours PTA.  No fever nor chills.  Diarrhea is watery, vomiting is NBNB.  No sick contacts.  Lives alone at baseline as husband just passed away a month ago.  Has tried nothing for symptoms, nothing makes better or worse. Worked up and admitted for CAP and Nausea/Vomitng/Diarrhea.   Assessment & Plan:   Principal Problem:   Right middle lobe pneumonia (HCC) Active Problems:   Essential hypertension   CAP (community acquired pneumonia)   Nausea vomiting and diarrhea  Right Middle Lobe Community Acquired Strep Pneumonia -Patient's CT Abd/Pelvis showed Patchy and ground-glass airspace disease within the right middle lobe is partially included which is suspicious for pneumonia. Linear opacities in the dependent lower lobes. No pleural fluid. 6 mm left lower lobe nodule image 21 series 6 is unchanged allowing for differences in caliper placement. This is an stable dating back to 2014 and considered benign. -Strep Pneumo Urinary Ag POSITIVE -WBC went from 18.8 -> 13.8; Had Temperature of 101.4 this AM -Blood Cx x2 Pending -Repeat CXR this AM showed Bronchitic changes with hyperinflation which could represent asthma or COPD. No acute infiltrate. -C/w IV Azithromycin and IV Ceftriaxone -C/w Tussionex 5 mL po q12hprn Cough -Added DuoNebs -C/w IVF at 75 mL/hr; S/p 1 Liter Bolus -Repeat CBC  In AM  Nausea/Vomiting/Diarrhea -C/w IV Zofran 4 mg q6hprn and with Loperamide 2 mg po Daily for Diarrhea/Loose Stool -CT of Abdomen/Pelvis showed Small hiatal hernia with distal esophageal thickening, suspect esophagitis.Partially calcified stones in the distal common bile duct,  at least one of which was present on prior lumbar spine MRI July 2017. Mild proximal biliary dilatation. Post cholecystectomy. Fat containing umbilical hernia, mildly increased in size. -Will obtain PT/OT Consult for weakness  GERD/Esophageal Stricture -C/w Pantoprazole 40 mg po Daily  Chronic Shoulder Pain/Back Pain -CT of Abd/Pelvsi showed probable subacute minimal L3 superior endplate compression fracture. -C/w Methocarbamol 500 mg po q4hprn  HTN -Continue Home Meds of Amlodipine 2.5 mg po Daily and Losartan 50 mg po Daily  Anxiety -C/w Xanax 0.25 mg po TIDprn  DVT prophylaxis: Lovenox 40 mg sq Code Status: FULL CODE Family Communication: No Family Present at bedside Disposition Plan: Remain on Med Surge Today  Consultants:   None   Procedures:    Antimicrobials: IV Ceftriaxone and IV Azithromycin  Subjective: Seen and examined this AM and she states that Nausea/Vomiting/Diarrhea is improving and that she still feels bad but is feeling slightly better than yesterday. No CP or SOB. No other concerns or complaints and states she feels extremely weak.   Objective: Vitals:   05/31/16 2042 05/31/16 2358 06/01/16 0030 06/01/16 0556  BP: 145/72 140/60 (!) 146/79 124/72  Pulse: 87 98 (!) 102 86  Resp: 20 18 18 16   Temp:  101.4 F (38.6 C) 98.8 F (37.1 C) 99 F (37.2 C)  TempSrc:  Oral Oral Oral  SpO2: 95% 96% 97% 97%  Weight:  78 kg (172 lb)    Height:  5\' 6"  (1.676 m)      Intake/Output Summary (Last 24 hours) at 06/01/16 1227 Last data filed at 06/01/16 1206  Gross per 24 hour  Intake  1901.67 ml  Output              200 ml  Net          1701.67 ml   Filed Weights   05/31/16 2358  Weight: 78 kg (172 lb)    Examination: Physical Exam:  Constitutional: WN/WD elderly female inNAD and appears calm laying in bed Eyes: Lids and conjunctivae normal, sclerae anicteric  ENMT: External Ears, Nose appear normal. Grossly normal hearing.  Neck: Appears  normal, supple, no cervical masses, normal ROM, no appreciable thyromegaly, no JVD.  Respiratory: Diminished to auscultation bilaterally, no wheezing, rales, rhonchi or crackles. Normal respiratory effort and patient is not tachypenic. No accessory muscle use.  Cardiovascular: RRR, no murmurs / rubs / gallops. S1 and S2 auscultated. No extremity edema.  Abdomen: Soft, non-tender to palpation, non-distended. No masses palpated. No appreciable hepatosplenomegaly. Bowel sounds positive x4.  GU: Deferred. Musculoskeletal: No clubbing / cyanosis of digits/nails. No joint deformity upper and lower extremities.  Skin: No rashes, lesions, ulcers. No induration; Warm and dry.  Neurologic: CN 2-12 grossly intact with no focal deficits. Sensation intact in all 4 Extremities. Romberg sign cerebellar reflexes not assessed.  Psychiatric: Normal judgment and insight. Alert and oriented x 3. Normal mood and appropriate affect.   Data Reviewed: I have personally reviewed following labs and imaging studies  CBC:  Recent Labs Lab 05/31/16 1913 06/01/16 0434  WBC 18.8* 13.8*  NEUTROABS 16.6* 10.9*  HGB 13.8 10.5*  HCT 41.6 31.5*  MCV 89.8 90.5  PLT 299 629   Basic Metabolic Panel:  Recent Labs Lab 05/31/16 1913 06/01/16 0434  NA 138 139  K 4.0 3.9  CL 105 109  CO2 24 25  GLUCOSE 126* 103*  BUN 17 14  CREATININE 1.20* 0.98  CALCIUM 9.1 7.8*  MG  --  1.9  PHOS  --  2.1*   GFR: Estimated Creatinine Clearance: 44.3 mL/min (by C-G formula based on SCr of 0.98 mg/dL). Liver Function Tests:  Recent Labs Lab 05/31/16 1913 06/01/16 0434  AST 98* 51*  ALT 71* 44  ALKPHOS 143* 92  BILITOT 0.5 0.8  PROT 7.1 5.1*  ALBUMIN 4.2 2.9*   No results for input(s): LIPASE, AMYLASE in the last 168 hours. No results for input(s): AMMONIA in the last 168 hours. Coagulation Profile: No results for input(s): INR, PROTIME in the last 168 hours. Cardiac Enzymes: No results for input(s): CKTOTAL, CKMB,  CKMBINDEX, TROPONINI in the last 168 hours. BNP (last 3 results) No results for input(s): PROBNP in the last 8760 hours. HbA1C: No results for input(s): HGBA1C in the last 72 hours. CBG: No results for input(s): GLUCAP in the last 168 hours. Lipid Profile: No results for input(s): CHOL, HDL, LDLCALC, TRIG, CHOLHDL, LDLDIRECT in the last 72 hours. Thyroid Function Tests: No results for input(s): TSH, T4TOTAL, FREET4, T3FREE, THYROIDAB in the last 72 hours. Anemia Panel: No results for input(s): VITAMINB12, FOLATE, FERRITIN, TIBC, IRON, RETICCTPCT in the last 72 hours. Sepsis Labs: No results for input(s): PROCALCITON, LATICACIDVEN in the last 168 hours.  No results found for this or any previous visit (from the past 240 hour(s)).   Radiology Studies: Ct Abdomen Pelvis Wo Contrast  Result Date: 05/31/2016 CLINICAL DATA:  Vomiting today. EXAM: CT ABDOMEN AND PELVIS WITHOUT CONTRAST TECHNIQUE: Multidetector CT imaging of the abdomen and pelvis was performed following the standard protocol without IV contrast. COMPARISON:  CT abdomen/ pelvis 01/22/2015, lumbar spine MRI 12/27/2015 FINDINGS: Lower chest: Patchy  and ground-glass airspace disease within the right middle lobe is partially included. Linear opacities in the dependent lower lobes. No pleural fluid. 6 mm left lower lobe nodule image 21 series 6 is unchanged allowing for differences in caliper placement. This is an stable dating back to 2014 and considered benign. Hepatobiliary: No focal abnormality allowing for lack contrast. Surgical clips in the gallbladder fossa postcholecystectomy. Dilatation of the common bile duct measuring 11 mm at the porta hepatis. High-density material is seen within the distal common bile duct, consistent with choledocholithiasis. Pancreas: Parenchymal atrophy. No ductal dilatation or inflammation. Spleen: Normal in size without focal abnormality. Adrenals/Urinary Tract: Mild thinning of the renal parenchyma, no  hydronephrosis. No perinephric edema. No urolithiasis. Both ureters are decompressed. Urinary bladder is physiologically distended. Stomach/Bowel: Small hiatal hernia with distal esophageal thickening. Stomach is physiologically distended. Enteric contrast has not yet exited the stomach. No small bowel dilatation or inflammation. Moderate colonic stool burden without colonic inflammation. There a few scattered colonic diverticular distally. The appendix is not visualized. Vascular/Lymphatic: Aortic atherosclerosis. No enlarged abdominal or pelvic lymph nodes. Reproductive: Status post hysterectomy. No adnexal masses. Other: Fat containing umbilical hernia, mild increase in size from prior CT, no bowel involvement. There is fat within both inguinal canals, right greater than left, unchanged. No free air or free fluid in the abdomen or pelvis. Musculoskeletal: Suspect subacute mild L3 superior endplate compression fracture with ill-defined scleroses. Degenerative disc disease at L4-L5 with vacuum phenomenon. IMPRESSION: 1. Small hiatal hernia with distal esophageal thickening, suspect esophagitis. 2. Partially calcified stones in the distal common bile duct, at least one of which was present on prior lumbar spine MRI July 2017. Mild proximal biliary dilatation. Post cholecystectomy. 3. Patchy and ground-glass airspace disease in the right middle lobe, partially included, suspicious for pneumonia. 4. Probable subacute minimal L3 superior endplate compression fracture. 5. Fat containing umbilical hernia, mildly increased in size. 6. Abdominal atherosclerosis. Electronically Signed   By: Jeb Levering M.D.   On: 05/31/2016 22:25   Dg Chest 2 View  Result Date: 06/01/2016 CLINICAL DATA:  Cough for 3-4 days, history hypertension EXAM: CHEST  2 VIEW COMPARISON:  09/09/2014 FINDINGS: Upper normal heart size. Mediastinal contours and pulmonary vascularity normal. Atherosclerotic calcification aorta. Bronchitic changes  with mild hyperinflation. Lungs clear. No infiltrate, pleural effusion, or pneumothorax. Diffuse osseous demineralization. IMPRESSION: Bronchitic changes with hyperinflation which could represent asthma or COPD. No acute infiltrate. Electronically Signed   By: Lavonia Dana M.D.   On: 06/01/2016 11:29   Scheduled Meds: . amLODipine  2.5 mg Oral Daily  . aspirin EC  81 mg Oral Daily  . azithromycin  500 mg Intravenous Q24H  . cefTRIAXone (ROCEPHIN)  IV  1 g Intravenous Q24H  . enoxaparin (LOVENOX) injection  40 mg Subcutaneous QHS  . losartan  50 mg Oral Daily  . pantoprazole  40 mg Oral Daily   Continuous Infusions: . sodium chloride 125 mL/hr at 06/01/16 0424    LOS: 0 days   Kerney Elbe, DO [facility] Pager [phone]  If 7PM-7AM, please contact night-coverage [email or web address] Password [facility] 06/01/2016, 12:27 PM

## 2016-06-01 NOTE — Progress Notes (Signed)
Pt back to room from Xray via wc accompanied by xray tech.

## 2016-06-02 DIAGNOSIS — R112 Nausea with vomiting, unspecified: Secondary | ICD-10-CM

## 2016-06-02 DIAGNOSIS — I10 Essential (primary) hypertension: Secondary | ICD-10-CM

## 2016-06-02 DIAGNOSIS — K8050 Calculus of bile duct without cholangitis or cholecystitis without obstruction: Secondary | ICD-10-CM

## 2016-06-02 DIAGNOSIS — R7401 Elevation of levels of liver transaminase levels: Secondary | ICD-10-CM

## 2016-06-02 DIAGNOSIS — J13 Pneumonia due to Streptococcus pneumoniae: Secondary | ICD-10-CM

## 2016-06-02 DIAGNOSIS — R197 Diarrhea, unspecified: Secondary | ICD-10-CM

## 2016-06-02 DIAGNOSIS — R740 Nonspecific elevation of levels of transaminase and lactic acid dehydrogenase [LDH]: Secondary | ICD-10-CM

## 2016-06-02 LAB — CBC WITH DIFFERENTIAL/PLATELET
BASOS ABS: 0 10*3/uL (ref 0.0–0.1)
BASOS PCT: 0 %
EOS ABS: 0.5 10*3/uL (ref 0.0–0.7)
EOS PCT: 7 %
HEMATOCRIT: 30.9 % — AB (ref 36.0–46.0)
Hemoglobin: 10.3 g/dL — ABNORMAL LOW (ref 12.0–15.0)
Lymphocytes Relative: 40 %
Lymphs Abs: 2.8 10*3/uL (ref 0.7–4.0)
MCH: 30.2 pg (ref 26.0–34.0)
MCHC: 33.3 g/dL (ref 30.0–36.0)
MCV: 90.6 fL (ref 78.0–100.0)
MONO ABS: 0.5 10*3/uL (ref 0.1–1.0)
Monocytes Relative: 8 %
NEUTROS ABS: 3.2 10*3/uL (ref 1.7–7.7)
Neutrophils Relative %: 45 %
PLATELETS: 226 10*3/uL (ref 150–400)
RBC: 3.41 MIL/uL — ABNORMAL LOW (ref 3.87–5.11)
RDW: 15.7 % — AB (ref 11.5–15.5)
WBC: 7 10*3/uL (ref 4.0–10.5)

## 2016-06-02 LAB — MAGNESIUM: Magnesium: 2.1 mg/dL (ref 1.7–2.4)

## 2016-06-02 LAB — COMPREHENSIVE METABOLIC PANEL
ALT: 33 U/L (ref 14–54)
ANION GAP: 3 — AB (ref 5–15)
AST: 30 U/L (ref 15–41)
Albumin: 2.9 g/dL — ABNORMAL LOW (ref 3.5–5.0)
Alkaline Phosphatase: 83 U/L (ref 38–126)
BUN: 10 mg/dL (ref 6–20)
CHLORIDE: 112 mmol/L — AB (ref 101–111)
CO2: 25 mmol/L (ref 22–32)
Calcium: 8.1 mg/dL — ABNORMAL LOW (ref 8.9–10.3)
Creatinine, Ser: 0.8 mg/dL (ref 0.44–1.00)
GFR calc Af Amer: >60 mL/min (ref >60)
GFR calc non Af Amer: >60 mL/min (ref >60)
GLUCOSE: 94 mg/dL (ref 65–99)
POTASSIUM: 3.7 mmol/L (ref 3.5–5.1)
Sodium: 140 mmol/L (ref 135–145)
Total Bilirubin: 0.7 mg/dL (ref 0.3–1.2)
Total Protein: 5.4 g/dL — ABNORMAL LOW (ref 6.5–8.1)

## 2016-06-02 LAB — LEGIONELLA PNEUMOPHILA SEROGP 1 UR AG: L. PNEUMOPHILA SEROGP 1 UR AG: NEGATIVE

## 2016-06-02 LAB — PHOSPHORUS: Phosphorus: 2.1 mg/dL — ABNORMAL LOW (ref 2.5–4.6)

## 2016-06-02 LAB — HIV ANTIBODY (ROUTINE TESTING W REFLEX): HIV Screen 4th Generation wRfx: NONREACTIVE

## 2016-06-02 NOTE — Progress Notes (Signed)
PROGRESS NOTE  Deanna Shields WUJ:[id number] DOB: Feb 28, 1929 DOA: 05/31/2016 PCP: Donato Schultz, DO  Brief History:  82 y.o.femalewith medical history significant of HTN. Patient presents to the ED with 2 week history of cough and congestion, and 1 day history of N/V/D. N/V/D onset 4 hours PTA. No fever nor chills. Diarrhea is watery, vomiting is NBNB. No sick contacts. Lives alone at baseline as husband just passed away a month ago. Has tried nothing for symptoms, nothing makes better or worse. Worked up and admitted for CAP and Nausea/Vomitng/Diarrhea.   Assessment/Plan:  Right Middle Lobe Community Acquired Pneumonia -Patient's CT Abd/Pelvis showed Patchy and ground-glass airspace disease within the right middle lobe is partially included which is suspicious for pneumonia. [name] 6 mm left lower lobe nodule image 21 series 6 is unchanged allowing for differences in caliper placement. -Strep Pneumo Urinary Ag POSITIVE -WBC went from 18.8 -> 7.0 -Blood Cx x2--neg -C/w  Azithromycin and IV Ceftriaxone -C/w Tussionex 5 mL po q12hprn Cough -C/w IVF at 75 mL/hr; S/p 1 Liter Bolus--->saline lock  Nausea/Vomiting/Diarrhea -C/w IV Zofran 4 mg q6hprn  -Resolved -CT of Abdomen/Pelvis showed Small hiatal hernia with distal esophageal thickening, suspect esophagitis.Partially calcified stones in the distal common bile duct, at least one of which was present on prior lumbar spine MRI July 2017. Mild proximal biliary dilatation. Post cholecystectomy. -Obtain MRCP in the setting of elevated LFTs and vomiting  GERD/Esophageal Stricture -C/w Pantoprazole 40 mg po Daily  Chronic Shoulder Pain/Back Pain -CT of Abd/Pelvsi showed probable subacute minimal L3 superior endplate compression fracture. -C/w Methocarbamol 500 mg po q4hprn  HTN -Continue Home Meds of Amlodipine 2.5 mg po Daily and Losartan 50 mg po Daily  Anxiety -C/w Xanax 0.25 mg po  TIDprn       Disposition Plan:   Home 06/03/16 if stable and MRCP unremarkable Family Communication:   No Family at bedside  Consultants:  none  Code Status:  FULL  DVT Prophylaxis:  [place] Lovenox   Procedures: As Listed in Progress Note Above  Antibiotics: Ceftriaxone 12/25>>> Azithromycin 12/25>>>    Subjective: Patient denies fevers, chills, headache, chest pain, dyspnea, nausea, vomiting, diarrhea, abdominal pain, dysuria, hematuria, hematochezia, and melena.   Objective: Vitals:   06/01/16 1417 06/01/16 2144 06/02/16 0512 06/02/16 1348  BP: (!) 120/52 (!) 129/53 (!) 131/58 (!) 148/52  Pulse: 70 72 73 67  Resp: 16 16 16 18   Temp: 98.2 F (36.8 C) 98.4 F (36.9 C) 98.1 F (36.7 C) 97.8 F (36.6 C)  TempSrc: Oral Oral Oral Oral  SpO2: 97% 97% 97% 99%  Weight:      Height:        Intake/Output Summary (Last 24 hours) at 06/02/16 1737 Last data filed at 06/02/16 1725  Gross per 24 hour  Intake             2760 ml  Output             2650 ml  Net              110 ml   Weight change:  Exam:   General:  Pt is alert, follows commands appropriately, not in acute distress  HEENT: No icterus, No thrush, No neck mass, /AT  Cardiovascular: RRR, S1/S2, no rubs, no gallops  Respiratory: Bibasilar crackles. No wheezing. Good air movement.  Abdomen: Soft/+BS, non tender, non distended, no guarding  Extremities: 1 + LE edema, No  lymphangitis, No petechiae, No rashes, no synovitis   Data Reviewed: I have personally reviewed following labs and imaging studies Basic Metabolic Panel:  Recent Labs Lab 05/31/16 1913 06/01/16 0434 06/02/16 0444  NA 138 139 140  K 4.0 3.9 3.7  CL 105 109 112*  CO2 24 25 25   GLUCOSE 126* 103* 94  BUN 17 14 10   CREATININE 1.20* 0.98 0.80  CALCIUM 9.1 7.8* 8.1*  MG  --  1.9 2.1  PHOS  --  2.1* 2.1*   Liver Function Tests:  Recent Labs Lab 05/31/16 1913 06/01/16 0434 06/02/16 0444  AST 98* 51* 30  ALT 71* 44 33   ALKPHOS 143* 92 83  BILITOT 0.5 0.8 0.7  PROT 7.1 5.1* 5.4*  ALBUMIN 4.2 2.9* 2.9*   No results for input(s): LIPASE, AMYLASE in the last 168 hours. No results for input(s): AMMONIA in the last 168 hours. Coagulation Profile: No results for input(s): INR, PROTIME in the last 168 hours. CBC:  Recent Labs Lab 05/31/16 1913 06/01/16 0434 06/02/16 0444  WBC 18.8* 13.8* 7.0  NEUTROABS 16.6* 10.9* 3.2  HGB 13.8 10.5* 10.3*  HCT 41.6 31.5* 30.9*  MCV 89.8 90.5 90.6  PLT 299 254 226   Cardiac Enzymes: No results for input(s): CKTOTAL, CKMB, CKMBINDEX, TROPONINI in the last 168 hours. BNP: Invalid input(s): POCBNP CBG: No results for input(s): GLUCAP in the last 168 hours. HbA1C: No results for input(s): HGBA1C in the last 72 hours. Urine analysis:    Component Value Date/Time   COLORURINE YELLOW 10/28/2013 2000   APPEARANCEUR CLOUDY (A) 10/28/2013 2000   LABSPEC 1.011 10/28/2013 2000   PHURINE 7.0 10/28/2013 2000   GLUCOSEU NEGATIVE 10/28/2013 2000   HGBUR NEGATIVE 10/28/2013 2000   HGBUR negative 06/04/2008 1606   BILIRUBINUR neg 08/25/2015 1217   KETONESUR NEGATIVE 10/28/2013 2000   PROTEINUR neg 08/25/2015 1217   PROTEINUR NEGATIVE 10/28/2013 2000   UROBILINOGEN 1.0 08/25/2015 1217   UROBILINOGEN 0.2 10/28/2013 2000   NITRITE neg 08/25/2015 1217   NITRITE NEGATIVE 10/28/2013 2000   LEUKOCYTESUR Negative 08/25/2015 1217   Sepsis Labs: @LABRCNTIP (procalcitonin:4,lacticidven:4) ) Recent Results (from the past 240 hour(s))  Culture, blood (routine x 2) Call MD if unable to obtain prior to antibiotics being given     Status: None (Preliminary result)   Collection Time: 06/01/16  4:33 AM  Result Value Ref Range Status   Specimen Description BLOOD LEFT ARM  Final   Special Requests BOTTLES DRAWN AEROBIC AND ANAEROBIC 5CC  Final   Culture   Final    NO GROWTH 1 DAY Performed at [facility]    Report Status PENDING  Incomplete  Culture, blood (routine x 2)  Call MD if unable to obtain prior to antibiotics being given     Status: None (Preliminary result)   Collection Time: 06/01/16  4:34 AM  Result Value Ref Range Status   Specimen Description BLOOD LEFT HAND  Final   Special Requests BOTTLES DRAWN AEROBIC AND ANAEROBIC 5CC  Final   Culture   Final    NO GROWTH 1 DAY Performed at [facility]    Report Status PENDING  Incomplete     Scheduled Meds: . amLODipine  2.5 mg Oral Daily  . aspirin EC  81 mg Oral Daily  . azithromycin  500 mg Intravenous Q24H  . cefTRIAXone (ROCEPHIN)  IV  1 g Intravenous Q24H  . enoxaparin (LOVENOX) injection  40 mg Subcutaneous QHS  . losartan  50  mg Oral Daily  . pantoprazole  40 mg Oral Daily   Continuous Infusions:  Procedures/Studies: Ct Abdomen Pelvis Wo Contrast  Result Date: 05/31/2016 CLINICAL DATA:  Vomiting today. EXAM: CT ABDOMEN AND PELVIS WITHOUT CONTRAST TECHNIQUE: Multidetector CT imaging of the abdomen and pelvis was performed following the standard protocol without IV contrast. COMPARISON:  CT abdomen/ pelvis 01/22/2015, lumbar spine MRI 12/27/2015 FINDINGS: Lower chest: Patchy and ground-glass airspace disease within the right middle lobe is partially included. Linear opacities in the dependent lower lobes. No pleural fluid. 6 mm left lower lobe nodule image 21 series 6 is unchanged allowing for differences in caliper placement. This is an stable dating back to 2014 and considered benign. Hepatobiliary: No focal abnormality allowing for lack contrast. Surgical clips in the gallbladder fossa postcholecystectomy. Dilatation of the common bile duct measuring 11 mm at the porta hepatis. High-density material is seen within the distal common bile duct, consistent with choledocholithiasis. Pancreas: Parenchymal atrophy. No ductal dilatation or inflammation. Spleen: Normal in size without focal abnormality. Adrenals/Urinary Tract: Mild thinning of the renal parenchyma, no hydronephrosis. No  perinephric edema. No urolithiasis. Both ureters are decompressed. Urinary bladder is physiologically distended. Stomach/Bowel: Small hiatal hernia with distal esophageal thickening. Stomach is physiologically distended. Enteric contrast has not yet exited the stomach. No small bowel dilatation or inflammation. Moderate colonic stool burden without colonic inflammation. There a few scattered colonic diverticular distally. The appendix is not visualized. Vascular/Lymphatic: Aortic atherosclerosis. No enlarged abdominal or pelvic lymph nodes. Reproductive: Status post hysterectomy. No adnexal masses. Other: Fat containing umbilical hernia, mild increase in size from prior CT, no bowel involvement. There is fat within both inguinal canals, right greater than left, unchanged. No free air or free fluid in the abdomen or pelvis. Musculoskeletal: Suspect subacute mild L3 superior endplate compression fracture with ill-defined scleroses. Degenerative disc disease at L4-L5 with vacuum phenomenon. IMPRESSION: 1. Small hiatal hernia with distal esophageal thickening, suspect esophagitis. 2. Partially calcified stones in the distal common bile duct, at least one of which was present on prior lumbar spine MRI July 2017. Mild proximal biliary dilatation. Post cholecystectomy. 3. Patchy and ground-glass airspace disease in the right middle lobe, partially included, suspicious for pneumonia. 4. Probable subacute minimal L3 superior endplate compression fracture. 5. Fat containing umbilical hernia, mildly increased in size. 6. Abdominal atherosclerosis. Electronically Signed   By: Rubye Oaks M.D.   On: 05/31/2016 22:25   Dg Chest 2 View  Result Date: 06/01/2016 CLINICAL DATA:  Cough for 3-4 days, history hypertension EXAM: CHEST  2 VIEW COMPARISON:  09/09/2014 FINDINGS: Upper normal heart size. Mediastinal contours and pulmonary vascularity normal. Atherosclerotic calcification aorta. Bronchitic changes with mild  hyperinflation. Lungs clear. No infiltrate, pleural effusion, or pneumothorax. Diffuse osseous demineralization. IMPRESSION: Bronchitic changes with hyperinflation which could represent asthma or COPD. No acute infiltrate. Electronically Signed   By: Ulyses Southward M.D.   On: 06/01/2016 11:29    Reshonda Koerber, DO  [facility] Pager [phone]  If 7PM-7AM, please contact night-coverage [email or web address] Password [email or web address] 06/02/2016, 5:37 PM   LOS: 1 day

## 2016-06-03 ENCOUNTER — Inpatient Hospital Stay (HOSPITAL_COMMUNITY): Payer: Medicare Other

## 2016-06-03 DIAGNOSIS — R740 Nonspecific elevation of levels of transaminase and lactic acid dehydrogenase [LDH]: Secondary | ICD-10-CM

## 2016-06-03 LAB — COMPREHENSIVE METABOLIC PANEL
ALK PHOS: 84 U/L (ref 38–126)
ALT: 29 U/L (ref 14–54)
AST: 29 U/L (ref 15–41)
Albumin: 3.9 g/dL (ref 3.5–5.0)
Anion gap: 8 (ref 5–15)
BILIRUBIN TOTAL: 0.8 mg/dL (ref 0.3–1.2)
BUN: 11 mg/dL (ref 6–20)
CALCIUM: 9.1 mg/dL (ref 8.9–10.3)
CHLORIDE: 109 mmol/L (ref 101–111)
CO2: 27 mmol/L (ref 22–32)
CREATININE: 0.97 mg/dL (ref 0.44–1.00)
GFR calc Af Amer: >60 mL/min (ref >60)
GFR, EST NON AFRICAN AMERICAN: 52 mL/min — AB (ref >60)
Glucose, Bld: 90 mg/dL (ref 65–99)
Potassium: 4.3 mmol/L (ref 3.5–5.1)
Sodium: 144 mmol/L (ref 135–145)
TOTAL PROTEIN: 6 g/dL — AB (ref 6.5–8.1)

## 2016-06-03 MED ORDER — GADOBENATE DIMEGLUMINE 529 MG/ML IV SOLN
16.0000 mL | Freq: Once | INTRAVENOUS | Status: AC | PRN
  Administered 2016-06-03: 19:00:00 16 mL via INTRAVENOUS

## 2016-06-03 NOTE — Progress Notes (Signed)
PROGRESS NOTE  [place] HQI:[id number] DOB: 31-Jul-1928 DOA: 05/31/2016 PCP: Donato Schultz, DO Brief History:  82 y.o.femalewith medical history significant of HTN. Patient presents to the ED with 2 week history of cough and congestion, and 1 day history of N/V/D. N/V/D onset 4 hours PTA. No fever nor chills. Diarrhea is watery, vomiting is NBNB. No sick contacts. Lives alone at baseline as husband just passed away a month ago. Has tried nothing for symptoms, nothing makes better or worse. Worked up and admitted for CAP and Nausea/Vomitng/Diarrhea.   Assessment/Plan:  Right Middle Lobe Community Acquired Pneumonia -Patient's CT Abd/Pelvis showed Patchy and ground-glass airspace disease within the right middle lobe is partially included which is suspicious for pneumonia.[name] 6 mm left lower lobe nodule image 21 series 6 is unchanged allowing for differences in caliper placement. -Strep Pneumo Urinary Ag POSITIVE -WBC went from 18.8 ->7.0 -Blood Cx x2--neg -C/w  IV Ceftriaxone -d/c azithromycin (had 3 days) -C/w IVF at 75 mL/hr; S/p 1 Liter Bolus--->saline lock  Nausea/Vomiting/Diarrhea -Resolved -CT of Abdomen/Pelvis showed Small hiatal hernia with distal esophageal thickening, suspectesophagitis.Partially calcified stones in the distal common bile duct, at least one of which was present on prior lumbar spine MRI July 2017. Mild proximal biliary dilatation. Post cholecystectomy. -Obtain MRCP in the setting of elevated LFTs and vomiting--? Choledocholithiasis  GERD/Esophageal Stricture -C/w Pantoprazole 40 mg po Daily  Chronic Shoulder Pain/Back Pain -CT of Abd/Pelvsi showed probable subacute minimal L3 superior endplate compression fracture. -C/w Methocarbamol 500 mg po q4hprn  HTN -Continue Home Meds of Amlodipine 2.5 mg po Daily and Losartan 50 mg po Daily  Anxiety -C/w Xanax 0.25 mg po TIDprn   Disposition Plan:   Home 06/04/16 if  stable and MRCP unremarkable Family Communication:   No Family at bedside  Consultants:  none  Code Status:  FULL  DVT Prophylaxis:  [place] Lovenox   Procedures: As Listed in Progress Note Above  Antibiotics: Ceftriaxone 12/25>>> Azithromycin 12/25>>>12/27   Subjective: Patient denies fevers, chills, headache, chest pain, dyspnea, nausea, vomiting, diarrhea, abdominal pain, dysuria, hematuria, hematochezia, and melena. She has a nonproductive cough.   Objective: Vitals:   06/02/16 1348 06/02/16 2112 06/03/16 0537 06/03/16 1430  BP: (!) 148/52 (!) 158/70 (!) 148/66 (!) 172/73  Pulse: 67 64 71 70  Resp: 18 18 18 17   Temp: 97.8 F (36.6 C) 98.1 F (36.7 C) 98 F (36.7 C) 97.4 F (36.3 C)  TempSrc: Oral Oral Oral Oral  SpO2: 99% 98% 97% 98%  Weight:      Height:        Intake/Output Summary (Last 24 hours) at 06/03/16 1531 Last data filed at 06/03/16 1400  Gross per 24 hour  Intake              900 ml  Output             1550 ml  Net             -650 ml   Weight change:  Exam:   General:  Pt is alert, follows commands appropriately, not in acute distress  HEENT: No icterus, No thrush, No neck mass, [place]/AT  Cardiovascular: RRR, S1/S2, no rubs, no gallops  Respiratory: CBibasilar crackles, right greater than left. No wheezing. Good air movement.  Abdomen: Soft/+BS, non tender, non distended, no guarding  Extremities: 1 + LE edema, No lymphangitis, No petechiae, No rashes, no synovitis   Data  Reviewed: I have personally reviewed following labs and imaging studies Basic Metabolic Panel:  Recent Labs Lab 05/31/16 1913 06/01/16 0434 06/02/16 0444 06/03/16 0422  NA 138 139 140 144  K 4.0 3.9 3.7 4.3  CL 105 109 112* 109  CO2 24 25 25 27   GLUCOSE 126* 103* 94 90  BUN 17 14 10 11   CREATININE 1.20* 0.98 0.80 0.97  CALCIUM 9.1 7.8* 8.1* 9.1  MG  --  1.9 2.1  --   PHOS  --  2.1* 2.1*  --    Liver Function Tests:  Recent Labs Lab 05/31/16 1913  06/01/16 0434 06/02/16 0444 06/03/16 0422  AST 98* 51* 30 29  ALT 71* 44 33 29  ALKPHOS 143* 92 83 84  BILITOT 0.5 0.8 0.7 0.8  PROT 7.1 5.1* 5.4* 6.0*  ALBUMIN 4.2 2.9* 2.9* 3.9   No results for input(s): LIPASE, AMYLASE in the last 168 hours. No results for input(s): AMMONIA in the last 168 hours. Coagulation Profile: No results for input(s): INR, PROTIME in the last 168 hours. CBC:  Recent Labs Lab 05/31/16 1913 06/01/16 0434 06/02/16 0444  WBC 18.8* 13.8* 7.0  NEUTROABS 16.6* 10.9* 3.2  HGB 13.8 10.5* 10.3*  HCT 41.6 31.5* 30.9*  MCV 89.8 90.5 90.6  PLT 299 254 226   Cardiac Enzymes: No results for input(s): CKTOTAL, CKMB, CKMBINDEX, TROPONINI in the last 168 hours. BNP: Invalid input(s): POCBNP CBG: No results for input(s): GLUCAP in the last 168 hours. HbA1C: No results for input(s): HGBA1C in the last 72 hours. Urine analysis:    Component Value Date/Time   COLORURINE YELLOW 10/28/2013 2000   APPEARANCEUR CLOUDY (A) 10/28/2013 2000   LABSPEC 1.011 10/28/2013 2000   PHURINE 7.0 10/28/2013 2000   GLUCOSEU NEGATIVE 10/28/2013 2000   HGBUR NEGATIVE 10/28/2013 2000   HGBUR negative 06/04/2008 1606   BILIRUBINUR neg 08/25/2015 1217   KETONESUR NEGATIVE 10/28/2013 2000   PROTEINUR neg 08/25/2015 1217   PROTEINUR NEGATIVE 10/28/2013 2000   UROBILINOGEN 1.0 08/25/2015 1217   UROBILINOGEN 0.2 10/28/2013 2000   NITRITE neg 08/25/2015 1217   NITRITE NEGATIVE 10/28/2013 2000   LEUKOCYTESUR Negative 08/25/2015 1217   Sepsis Labs: @LABRCNTIP (procalcitonin:4,lacticidven:4) ) Recent Results (from the past 240 hour(s))  Culture, blood (routine x 2) Call MD if unable to obtain prior to antibiotics being given     Status: None (Preliminary result)   Collection Time: 06/01/16  4:33 AM  Result Value Ref Range Status   Specimen Description BLOOD LEFT ARM  Final   Special Requests BOTTLES DRAWN AEROBIC AND ANAEROBIC 5CC  Final   Culture   Final    NO GROWTH 2  DAYS Performed at [facility]    Report Status PENDING  Incomplete  Culture, blood (routine x 2) Call MD if unable to obtain prior to antibiotics being given     Status: None (Preliminary result)   Collection Time: 06/01/16  4:34 AM  Result Value Ref Range Status   Specimen Description BLOOD LEFT HAND  Final   Special Requests BOTTLES DRAWN AEROBIC AND ANAEROBIC 5CC  Final   Culture   Final    NO GROWTH 2 DAYS Performed at [facility]    Report Status PENDING  Incomplete     Scheduled Meds: . amLODipine  2.5 mg Oral Daily  . aspirin EC  81 mg Oral Daily  . cefTRIAXone (ROCEPHIN)  IV  1 g Intravenous Q24H  . enoxaparin (LOVENOX) injection  40 mg Subcutaneous  QHS  . losartan  50 mg Oral Daily  . pantoprazole  40 mg Oral Daily   Continuous Infusions:  Procedures/Studies: Ct Abdomen Pelvis Wo Contrast  Result Date: 05/31/2016 CLINICAL DATA:  Vomiting today. EXAM: CT ABDOMEN AND PELVIS WITHOUT CONTRAST TECHNIQUE: Multidetector CT imaging of the abdomen and pelvis was performed following the standard protocol without IV contrast. COMPARISON:  CT abdomen/ pelvis 01/22/2015, lumbar spine MRI 12/27/2015 FINDINGS: Lower chest: Patchy and ground-glass airspace disease within the right middle lobe is partially included. Linear opacities in the dependent lower lobes. No pleural fluid. 6 mm left lower lobe nodule image 21 series 6 is unchanged allowing for differences in caliper placement. This is an stable dating back to 2014 and considered benign. Hepatobiliary: No focal abnormality allowing for lack contrast. Surgical clips in the gallbladder fossa postcholecystectomy. Dilatation of the common bile duct measuring 11 mm at the porta hepatis. High-density material is seen within the distal common bile duct, consistent with choledocholithiasis. Pancreas: Parenchymal atrophy. No ductal dilatation or inflammation. Spleen: Normal in size without focal abnormality. Adrenals/Urinary Tract:  Mild thinning of the renal parenchyma, no hydronephrosis. No perinephric edema. No urolithiasis. Both ureters are decompressed. Urinary bladder is physiologically distended. Stomach/Bowel: Small hiatal hernia with distal esophageal thickening. Stomach is physiologically distended. Enteric contrast has not yet exited the stomach. No small bowel dilatation or inflammation. Moderate colonic stool burden without colonic inflammation. There a few scattered colonic diverticular distally. The appendix is not visualized. Vascular/Lymphatic: Aortic atherosclerosis. No enlarged abdominal or pelvic lymph nodes. Reproductive: Status post hysterectomy. No adnexal masses. Other: Fat containing umbilical hernia, mild increase in size from prior CT, no bowel involvement. There is fat within both inguinal canals, right greater than left, unchanged. No free air or free fluid in the abdomen or pelvis. Musculoskeletal: Suspect subacute mild L3 superior endplate compression fracture with ill-defined scleroses. Degenerative disc disease at L4-L5 with vacuum phenomenon. IMPRESSION: 1. Small hiatal hernia with distal esophageal thickening, suspect esophagitis. 2. Partially calcified stones in the distal common bile duct, at least one of which was present on prior lumbar spine MRI July 2017. Mild proximal biliary dilatation. Post cholecystectomy. 3. Patchy and ground-glass airspace disease in the right middle lobe, partially included, suspicious for pneumonia. 4. Probable subacute minimal L3 superior endplate compression fracture. 5. Fat containing umbilical hernia, mildly increased in size. 6. Abdominal atherosclerosis. Electronically Signed   By: Rubye Oaks M.D.   On: 05/31/2016 22:25   Dg Chest 2 View  Result Date: 06/01/2016 CLINICAL DATA:  Cough for 3-4 days, history hypertension EXAM: CHEST  2 VIEW COMPARISON:  09/09/2014 FINDINGS: Upper normal heart size. Mediastinal contours and pulmonary vascularity normal.  Atherosclerotic calcification aorta. Bronchitic changes with mild hyperinflation. Lungs clear. No infiltrate, pleural effusion, or pneumothorax. Diffuse osseous demineralization. IMPRESSION: Bronchitic changes with hyperinflation which could represent asthma or COPD. No acute infiltrate. Electronically Signed   By: Ulyses Southward M.D.   On: 06/01/2016 11:29    Bryana Froemming, DO  [facility] Pager [phone]  If 7PM-7AM, please contact night-coverage [email or web address] Password [email or web address] 06/03/2016, 3:31 PM   LOS: 2 days

## 2016-06-04 DIAGNOSIS — K8050 Calculus of bile duct without cholangitis or cholecystitis without obstruction: Secondary | ICD-10-CM

## 2016-06-04 LAB — COMPREHENSIVE METABOLIC PANEL
ALK PHOS: 74 U/L (ref 38–126)
ALT: 27 U/L (ref 14–54)
AST: 27 U/L (ref 15–41)
Albumin: 3.5 g/dL (ref 3.5–5.0)
Anion gap: 7 (ref 5–15)
BUN: 12 mg/dL (ref 6–20)
CALCIUM: 9 mg/dL (ref 8.9–10.3)
CHLORIDE: 107 mmol/L (ref 101–111)
CO2: 29 mmol/L (ref 22–32)
CREATININE: 0.9 mg/dL (ref 0.44–1.00)
GFR, EST NON AFRICAN AMERICAN: 57 mL/min — AB (ref >60)
Glucose, Bld: 96 mg/dL (ref 65–99)
Potassium: 4 mmol/L (ref 3.5–5.1)
Sodium: 143 mmol/L (ref 135–145)
Total Bilirubin: 0.4 mg/dL (ref 0.3–1.2)
Total Protein: 5.4 g/dL — ABNORMAL LOW (ref 6.5–8.1)

## 2016-06-04 MED ORDER — CEFDINIR 300 MG PO CAPS: 300.0000 mg | ORAL_CAPSULE | Freq: Two times a day (BID) | ORAL | 0 refills | Status: DC

## 2016-06-04 MED ORDER — LIP MEDEX EX OINT
TOPICAL_OINTMENT | CUTANEOUS | Status: AC
  Administered 2016-06-04: 08:00:00
  Filled 2016-06-04: qty 7

## 2016-06-04 NOTE — Discharge Summary (Signed)
Physician Discharge Summary  [place] YSA:[id number] DOB: July 04, 1928 DOA: 05/31/2016  PCP: Donato Schultz, DO  Admit date: 05/31/2016 Discharge date: 06/04/2016  [facility] From[facility]  Recommendations for Outpatient Follow-up:  1. Follow up with PCP in 1-2 weeks 2. Please obtain BMP/CBC in one week 3. Please follow up with [facility] on 06/17/16  Home Health:No Equipment/Devices:none  Discharge Condition:stable CODE STATUS:FULL Diet recommendation: Heart Healthy   Brief/Interim Summary: 82 y.o.femalewith medical history significant of HTN. Patient presents to the ED with 2 week history of cough and congestion, and 1 day history of N/V/D. N/V/D onset 4 hours PTA. No fever nor chills. Diarrhea is watery, vomiting is NBNB. No sick contacts. Lives alone at baseline as husband just passed away a month ago. Has tried nothing for symptoms, nothing makes better or worse. Worked up and admitted for CAP and Nausea/Vomitng/Diarrhea. Initial CT of the abdomen and pelvis revealed incidental finding of possible choledocholithiasis. Because of her presenting symptoms and mildly elevated LFTs, MRCP was obtained and revealed choledocholithiasis with 3 filling defects in the common bile duct measuring up to 8 mm in diameter in the mid common bile duct. Common bile duct is mildly dilated measuring 11mm. However, there is no intrahepatic biliary ductal dilatation to suggest frank biliary obstruction.  Fortunately, the patient was completely asymptomatic, and her LFTs normalized. [facility] was consulted,, and he felt that the patient was stable for discharge to follow-up in the office on 06/17/2016 for possible ERCP.  Discharge Diagnoses:  Right Middle Lobe Community Acquired Pneumonia -Patient's CT Abd/Pelvis showed Patchy and ground-glass airspace disease within the right middle lobe is partially included which is suspicious for pneumonia.[name] 6 mm  left lower lobe nodule image 21 series 6 is unchanged allowing for differences in caliper placement. -Strep Pneumo Urinary Ag POSITIVE -WBC went from 18.8 ->7.0 -Blood Cx x2--neg -C/w  IV Ceftriaxone-->home with omnicef x 3 more days to complete 1 week of tx -d/c azithromycin (had 3 days) -C/w IVF at 75 mL/hr; S/p 1 Liter Bolus--->saline lock  Nausea/Vomiting/Diarrhea -Resolved -CT of Abdomen/Pelvis showed Small hiatal hernia with distal esophageal thickening, suspectesophagitis.Partially calcified stones in the distal common bile duct, at least one of which was present on prior lumbar spine MRI July 2017. Mild proximal biliary dilatation. Post cholecystectomy. -Obtain MRCP in the setting of elevated LFTs and vomiting  Choledocholithiasis without cholangitis or obstruction 06/02/16 MRCP--choledocholithiasis with 3 filling defects in the common bile duct measuring up to 8 mm in diameter in the mid common bile duct. Common bile duct is mildly dilated measuring 11mm. However, there is no intrahepatic biliary ductal dilatation to suggest frank biliary obstruction. -LFTs normal -[facility] consulted--patient to follow-up in the outpatient setting--appointment 06/17/2016.   GERD/Esophageal Stricture -C/w Pantoprazole 40 mg po Daily -tolerating diet  Chronic Shoulder Pain/Back Pain -CT of Abd/Pelvsi showed probable subacute minimal L3 superior endplate compression fracture. -C/w Methocarbamol 500 mg po q4hprn  HTN -Continue Home Meds of Amlodipine 2.5 mg po Daily and Losartan 50 mg po Daily  Anxiety -C/w Xanax 0.25 mg po TIDprn   Discharge Instructions  Discharge Instructions    Diet - low sodium heart healthy    Complete by:  As directed    Increase activity slowly    Complete by:  As directed      Allergies as of 06/04/2016      Reactions   Contrast Media [iodinated Diagnostic Agents] Itching, Rash   Pt covered in  rash, red.   Codeine Nausea And Vomiting   in large  doses: can tolerate Tussionex      Medication List    STOP taking these medications   chlorpheniramine-HYDROcodone 10-8 MG/5ML Suer Commonly known as:  TUSSIONEX PENNKINETIC ER     TAKE these medications   ALPRAZolam 0.25 MG tablet Commonly known as:  XANAX Take 1 tablet (0.25 mg total) by mouth 3 (three) times daily as needed for anxiety.   amLODipine 2.5 MG tablet Commonly known as:  NORVASC Take 1 tablet (2.5 mg total) by mouth daily.   aspirin 81 MG tablet Take 81 mg by mouth daily.   cefdinir 300 MG capsule Commonly known as:  OMNICEF Take 1 capsule (300 mg total) by mouth 2 (two) times daily.   CITRACAL PO Take 1 tablet by mouth daily.   losartan 50 MG tablet Commonly known as:  COZAAR Take 1 tablet (50 mg total) by mouth daily.   methocarbamol 500 MG tablet Commonly known as:  ROBAXIN Take 1 tablet (500 mg total) by mouth 4 (four) times daily as needed for muscle spasms.   omeprazole 40 MG capsule Commonly known as:  PRILOSEC TAKE 1 CAPSULE DAILY What changed:  See the new instructions.      Follow-up Information    Willette Cluster, NP Follow up on 06/17/2016.   Specialty:  Gastroenterology Why:  at 1:30pm Contact information: [address] [place] [address] [phone]          Allergies  Allergen Reactions  . Contrast Media [Iodinated Diagnostic Agents] Itching and Rash    Pt covered in rash, red.  . Codeine Nausea And Vomiting    in large doses: can tolerate Tussionex    Consultations:  [facility]   Procedures/Studies: Ct Abdomen Pelvis Wo Contrast  Result Date: 05/31/2016 CLINICAL DATA:  Vomiting today. EXAM: CT ABDOMEN AND PELVIS WITHOUT CONTRAST TECHNIQUE: Multidetector CT imaging of the abdomen and pelvis was performed following the standard protocol without IV contrast. COMPARISON:  CT abdomen/ pelvis 01/22/2015, lumbar spine MRI 12/27/2015 FINDINGS: Lower chest: Patchy and ground-glass airspace disease within the right  middle lobe is partially included. Linear opacities in the dependent lower lobes. No pleural fluid. 6 mm left lower lobe nodule image 21 series 6 is unchanged allowing for differences in caliper placement. This is an stable dating back to 2014 and considered benign. Hepatobiliary: No focal abnormality allowing for lack contrast. Surgical clips in the gallbladder fossa postcholecystectomy. Dilatation of the common bile duct measuring 11 mm at the porta hepatis. High-density material is seen within the distal common bile duct, consistent with choledocholithiasis. Pancreas: Parenchymal atrophy. No ductal dilatation or inflammation. Spleen: Normal in size without focal abnormality. Adrenals/Urinary Tract: Mild thinning of the renal parenchyma, no hydronephrosis. No perinephric edema. No urolithiasis. Both ureters are decompressed. Urinary bladder is physiologically distended. Stomach/Bowel: Small hiatal hernia with distal esophageal thickening. Stomach is physiologically distended. Enteric contrast has not yet exited the stomach. No small bowel dilatation or inflammation. Moderate colonic stool burden without colonic inflammation. There a few scattered colonic diverticular distally. The appendix is not visualized. Vascular/Lymphatic: Aortic atherosclerosis. No enlarged abdominal or pelvic lymph nodes. Reproductive: Status post hysterectomy. No adnexal masses. Other: Fat containing umbilical hernia, mild increase in size from prior CT, no bowel involvement. There is fat within both inguinal canals, right greater than left, unchanged. No free air or free fluid in the abdomen or pelvis. Musculoskeletal: Suspect subacute mild L3 superior endplate compression fracture with  ill-defined scleroses. Degenerative disc disease at L4-L5 with vacuum phenomenon. IMPRESSION: 1. Small hiatal hernia with distal esophageal thickening, suspect esophagitis. 2. Partially calcified stones in the distal common bile duct, at least one of which  was present on prior lumbar spine MRI July 2017. Mild proximal biliary dilatation. Post cholecystectomy. 3. Patchy and ground-glass airspace disease in the right middle lobe, partially included, suspicious for pneumonia. 4. Probable subacute minimal L3 superior endplate compression fracture. 5. Fat containing umbilical hernia, mildly increased in size. 6. Abdominal atherosclerosis. Electronically Signed   By: Rubye Oaks M.D.   On: 05/31/2016 22:25   Dg Chest 2 View  Result Date: 06/01/2016 CLINICAL DATA:  Cough for 3-4 days, history hypertension EXAM: CHEST  2 VIEW COMPARISON:  09/09/2014 FINDINGS: Upper normal heart size. Mediastinal contours and pulmonary vascularity normal. Atherosclerotic calcification aorta. Bronchitic changes with mild hyperinflation. Lungs clear. No infiltrate, pleural effusion, or pneumothorax. Diffuse osseous demineralization. IMPRESSION: Bronchitic changes with hyperinflation which could represent asthma or COPD. No acute infiltrate. Electronically Signed   By: Ulyses Southward M.D.   On: 06/01/2016 11:29   Mr 3d Recon At Scanner  Result Date: 06/04/2016 CLINICAL DATA:  82 year old female with history of nausea, vomiting and diarrhea with and elevated liver function tests. Evaluate for potential choledocholithiasis. EXAM: MRI ABDOMEN WITHOUT AND WITH CONTRAST (INCLUDING MRCP) TECHNIQUE: Multiplanar multisequence MR imaging of the abdomen was performed both before and after the administration of intravenous contrast. Heavily T2-weighted images of the biliary and pancreatic ducts were obtained, and three-dimensional MRCP images were rendered by post processing. CONTRAST:  16 mL of MultiHance COMPARISON:  No prior abdominal MRI. CT the abdomen and pelvis 05/31/2016. FINDINGS: Lower chest: Unremarkable. Hepatobiliary: No cystic or solid hepatic lesions. Status post cholecystectomy. MRCP images demonstrate some mild common bile duct dilatation, but no definite intrahepatic biliary  ductal dilatation. Common bile duct measures up to 11 mm in the porta hepatis. Within the common bile duct there are 3 filling defects located in the proximal common bile duct immediately adjacent to the junction of common hepatic and cystic ducts, and in the mid common bile duct, measuring up to 8 mm in diameter in the mid common bile duct. Pancreas: No pancreatic mass. No pancreatic ductal dilatation on MRCP images. No pancreatic or peripancreatic fluid or inflammatory changes. Spleen:  Unremarkable. Adrenals/Urinary Tract: Bilateral kidneys and bilateral adrenal glands are normal in appearance. No hydroureteronephrosis in the visualized abdomen. Stomach/Bowel: Visualized portions are unremarkable. Vascular/Lymphatic: Atherosclerosis in the visualized abdominal vasculature, without definite aneurysm. No lymphadenopathy noted in the abdomen. Other: Incompletely visualized ventral hernia which appears to contain some omental fat and loops of small bowel. No significant volume of ascites noted in the visualized portions of the peritoneal cavity. Musculoskeletal: No aggressive osseous lesions are noted in the visualized portions of the skeleton. IMPRESSION: 1. Study is positive for choledocholithiasis with 3 filling defects in the common bile duct measuring up to 8 mm in diameter in the mid common bile duct. Common bile duct is mildly dilated measuring 11 mm. However, there is no intrahepatic biliary ductal dilatation to suggest frank biliary obstruction. Common bile duct dilatation in this setting could simply be a reflection of post cholecystectomy physiology and patient's advanced age. 2. Aortic atherosclerosis. 3. Small ventral hernia. Electronically Signed   By: Trudie Reed M.D.   On: 06/04/2016 07:41   Mr Abdomen Mrcp Vivien Rossetti Contast  Result Date: 06/04/2016 CLINICAL DATA:  83 year old female with history of nausea, vomiting and diarrhea  with and elevated liver function tests. Evaluate for potential  choledocholithiasis. EXAM: MRI ABDOMEN WITHOUT AND WITH CONTRAST (INCLUDING MRCP) TECHNIQUE: Multiplanar multisequence MR imaging of the abdomen was performed both before and after the administration of intravenous contrast. Heavily T2-weighted images of the biliary and pancreatic ducts were obtained, and three-dimensional MRCP images were rendered by post processing. CONTRAST:  16 mL of MultiHance COMPARISON:  No prior abdominal MRI. CT the abdomen and pelvis 05/31/2016. FINDINGS: Lower chest: Unremarkable. Hepatobiliary: No cystic or solid hepatic lesions. Status post cholecystectomy. MRCP images demonstrate some mild common bile duct dilatation, but no definite intrahepatic biliary ductal dilatation. Common bile duct measures up to 11 mm in the porta hepatis. Within the common bile duct there are 3 filling defects located in the proximal common bile duct immediately adjacent to the junction of common hepatic and cystic ducts, and in the mid common bile duct, measuring up to 8 mm in diameter in the mid common bile duct. Pancreas: No pancreatic mass. No pancreatic ductal dilatation on MRCP images. No pancreatic or peripancreatic fluid or inflammatory changes. Spleen:  Unremarkable. Adrenals/Urinary Tract: Bilateral kidneys and bilateral adrenal glands are normal in appearance. No hydroureteronephrosis in the visualized abdomen. Stomach/Bowel: Visualized portions are unremarkable. Vascular/Lymphatic: Atherosclerosis in the visualized abdominal vasculature, without definite aneurysm. No lymphadenopathy noted in the abdomen. Other: Incompletely visualized ventral hernia which appears to contain some omental fat and loops of small bowel. No significant volume of ascites noted in the visualized portions of the peritoneal cavity. Musculoskeletal: No aggressive osseous lesions are noted in the visualized portions of the skeleton. IMPRESSION: 1. Study is positive for choledocholithiasis with 3 filling defects in the common  bile duct measuring up to 8 mm in diameter in the mid common bile duct. Common bile duct is mildly dilated measuring 11 mm. However, there is no intrahepatic biliary ductal dilatation to suggest frank biliary obstruction. Common bile duct dilatation in this setting could simply be a reflection of post cholecystectomy physiology and patient's advanced age. 2. Aortic atherosclerosis. 3. Small ventral hernia. Electronically Signed   By: Trudie Reed M.D.   On: 06/04/2016 07:41        Discharge Exam: Vitals:   06/04/16 0500 06/04/16 0947  BP: (!) 153/71 (!) 185/72  Pulse: 65   Resp: 16   Temp: 98.5 F (36.9 C)    Vitals:   06/03/16 1430 06/03/16 2229 06/04/16 0500 06/04/16 0947  BP: (!) 172/73 (!) 145/58 (!) 153/71 (!) 185/72  Pulse: 70 71 65   Resp: 17 16 16    Temp: 97.4 F (36.3 C) 98.2 F (36.8 C) 98.5 F (36.9 C)   TempSrc: Oral Oral Oral   SpO2: 98% 99% 98%   Weight:      Height:        General: Pt is alert, awake, not in acute distress Cardiovascular: RRR, S1/S2 +, no rubs, no gallops Respiratory: CTA bilaterally, no wheezing, no rhonchi Abdominal: Soft, NT, ND, bowel sounds + Extremities: no edema, no cyanosis   The results of significant diagnostics from this hospitalization (including imaging, microbiology, ancillary and laboratory) are listed below for reference.    Significant Diagnostic Studies: Ct Abdomen Pelvis Wo Contrast  Result Date: 05/31/2016 CLINICAL DATA:  Vomiting today. EXAM: CT ABDOMEN AND PELVIS WITHOUT CONTRAST TECHNIQUE: Multidetector CT imaging of the abdomen and pelvis was performed following the standard protocol without IV contrast. COMPARISON:  CT abdomen/ pelvis 01/22/2015, lumbar spine MRI 12/27/2015 FINDINGS: Lower chest: Patchy and ground-glass airspace disease  within the right middle lobe is partially included. Linear opacities in the dependent lower lobes. No pleural fluid. 6 mm left lower lobe nodule image 21 series 6 is unchanged  allowing for differences in caliper placement. This is an stable dating back to 2014 and considered benign. Hepatobiliary: No focal abnormality allowing for lack contrast. Surgical clips in the gallbladder fossa postcholecystectomy. Dilatation of the common bile duct measuring 11 mm at the porta hepatis. High-density material is seen within the distal common bile duct, consistent with choledocholithiasis. Pancreas: Parenchymal atrophy. No ductal dilatation or inflammation. Spleen: Normal in size without focal abnormality. Adrenals/Urinary Tract: Mild thinning of the renal parenchyma, no hydronephrosis. No perinephric edema. No urolithiasis. Both ureters are decompressed. Urinary bladder is physiologically distended. Stomach/Bowel: Small hiatal hernia with distal esophageal thickening. Stomach is physiologically distended. Enteric contrast has not yet exited the stomach. No small bowel dilatation or inflammation. Moderate colonic stool burden without colonic inflammation. There a few scattered colonic diverticular distally. The appendix is not visualized. Vascular/Lymphatic: Aortic atherosclerosis. No enlarged abdominal or pelvic lymph nodes. Reproductive: Status post hysterectomy. No adnexal masses. Other: Fat containing umbilical hernia, mild increase in size from prior CT, no bowel involvement. There is fat within both inguinal canals, right greater than left, unchanged. No free air or free fluid in the abdomen or pelvis. Musculoskeletal: Suspect subacute mild L3 superior endplate compression fracture with ill-defined scleroses. Degenerative disc disease at L4-L5 with vacuum phenomenon. IMPRESSION: 1. Small hiatal hernia with distal esophageal thickening, suspect esophagitis. 2. Partially calcified stones in the distal common bile duct, at least one of which was present on prior lumbar spine MRI July 2017. Mild proximal biliary dilatation. Post cholecystectomy. 3. Patchy and ground-glass airspace disease in the  right middle lobe, partially included, suspicious for pneumonia. 4. Probable subacute minimal L3 superior endplate compression fracture. 5. Fat containing umbilical hernia, mildly increased in size. 6. Abdominal atherosclerosis. Electronically Signed   By: Rubye Oaks M.D.   On: 05/31/2016 22:25   Dg Chest 2 View  Result Date: 06/01/2016 CLINICAL DATA:  Cough for 3-4 days, history hypertension EXAM: CHEST  2 VIEW COMPARISON:  09/09/2014 FINDINGS: Upper normal heart size. Mediastinal contours and pulmonary vascularity normal. Atherosclerotic calcification aorta. Bronchitic changes with mild hyperinflation. Lungs clear. No infiltrate, pleural effusion, or pneumothorax. Diffuse osseous demineralization. IMPRESSION: Bronchitic changes with hyperinflation which could represent asthma or COPD. No acute infiltrate. Electronically Signed   By: Ulyses Southward M.D.   On: 06/01/2016 11:29   Mr 3d Recon At Scanner  Result Date: 06/04/2016 CLINICAL DATA:  82 year old female with history of nausea, vomiting and diarrhea with and elevated liver function tests. Evaluate for potential choledocholithiasis. EXAM: MRI ABDOMEN WITHOUT AND WITH CONTRAST (INCLUDING MRCP) TECHNIQUE: Multiplanar multisequence MR imaging of the abdomen was performed both before and after the administration of intravenous contrast. Heavily T2-weighted images of the biliary and pancreatic ducts were obtained, and three-dimensional MRCP images were rendered by post processing. CONTRAST:  16 mL of MultiHance COMPARISON:  No prior abdominal MRI. CT the abdomen and pelvis 05/31/2016. FINDINGS: Lower chest: Unremarkable. Hepatobiliary: No cystic or solid hepatic lesions. Status post cholecystectomy. MRCP images demonstrate some mild common bile duct dilatation, but no definite intrahepatic biliary ductal dilatation. Common bile duct measures up to 11 mm in the porta hepatis. Within the common bile duct there are 3 filling defects located in the proximal  common bile duct immediately adjacent to the junction of common hepatic and cystic ducts, and in the mid  common bile duct, measuring up to 8 mm in diameter in the mid common bile duct. Pancreas: No pancreatic mass. No pancreatic ductal dilatation on MRCP images. No pancreatic or peripancreatic fluid or inflammatory changes. Spleen:  Unremarkable. Adrenals/Urinary Tract: Bilateral kidneys and bilateral adrenal glands are normal in appearance. No hydroureteronephrosis in the visualized abdomen. Stomach/Bowel: Visualized portions are unremarkable. Vascular/Lymphatic: Atherosclerosis in the visualized abdominal vasculature, without definite aneurysm. No lymphadenopathy noted in the abdomen. Other: Incompletely visualized ventral hernia which appears to contain some omental fat and loops of small bowel. No significant volume of ascites noted in the visualized portions of the peritoneal cavity. Musculoskeletal: No aggressive osseous lesions are noted in the visualized portions of the skeleton. IMPRESSION: 1. Study is positive for choledocholithiasis with 3 filling defects in the common bile duct measuring up to 8 mm in diameter in the mid common bile duct. Common bile duct is mildly dilated measuring 11 mm. However, there is no intrahepatic biliary ductal dilatation to suggest frank biliary obstruction. Common bile duct dilatation in this setting could simply be a reflection of post cholecystectomy physiology and patient's advanced age. 2. Aortic atherosclerosis. 3. Small ventral hernia. Electronically Signed   By: Trudie Reed M.D.   On: 06/04/2016 07:41   Mr Abdomen Mrcp Vivien Rossetti Contast  Result Date: 06/04/2016 CLINICAL DATA:  82 year old female with history of nausea, vomiting and diarrhea with and elevated liver function tests. Evaluate for potential choledocholithiasis. EXAM: MRI ABDOMEN WITHOUT AND WITH CONTRAST (INCLUDING MRCP) TECHNIQUE: Multiplanar multisequence MR imaging of the abdomen was performed both  before and after the administration of intravenous contrast. Heavily T2-weighted images of the biliary and pancreatic ducts were obtained, and three-dimensional MRCP images were rendered by post processing. CONTRAST:  16 mL of MultiHance COMPARISON:  No prior abdominal MRI. CT the abdomen and pelvis 05/31/2016. FINDINGS: Lower chest: Unremarkable. Hepatobiliary: No cystic or solid hepatic lesions. Status post cholecystectomy. MRCP images demonstrate some mild common bile duct dilatation, but no definite intrahepatic biliary ductal dilatation. Common bile duct measures up to 11 mm in the porta hepatis. Within the common bile duct there are 3 filling defects located in the proximal common bile duct immediately adjacent to the junction of common hepatic and cystic ducts, and in the mid common bile duct, measuring up to 8 mm in diameter in the mid common bile duct. Pancreas: No pancreatic mass. No pancreatic ductal dilatation on MRCP images. No pancreatic or peripancreatic fluid or inflammatory changes. Spleen:  Unremarkable. Adrenals/Urinary Tract: Bilateral kidneys and bilateral adrenal glands are normal in appearance. No hydroureteronephrosis in the visualized abdomen. Stomach/Bowel: Visualized portions are unremarkable. Vascular/Lymphatic: Atherosclerosis in the visualized abdominal vasculature, without definite aneurysm. No lymphadenopathy noted in the abdomen. Other: Incompletely visualized ventral hernia which appears to contain some omental fat and loops of small bowel. No significant volume of ascites noted in the visualized portions of the peritoneal cavity. Musculoskeletal: No aggressive osseous lesions are noted in the visualized portions of the skeleton. IMPRESSION: 1. Study is positive for choledocholithiasis with 3 filling defects in the common bile duct measuring up to 8 mm in diameter in the mid common bile duct. Common bile duct is mildly dilated measuring 11 mm. However, there is no intrahepatic  biliary ductal dilatation to suggest frank biliary obstruction. Common bile duct dilatation in this setting could simply be a reflection of post cholecystectomy physiology and patient's advanced age. 2. Aortic atherosclerosis. 3. Small ventral hernia. Electronically Signed   By: Brayton Mars.D.  On: 06/04/2016 07:41     Microbiology: Recent Results (from the past 240 hour(s))  Culture, blood (routine x 2) Call MD if unable to obtain prior to antibiotics being given     Status: None (Preliminary result)   Collection Time: 06/01/16  4:33 AM  Result Value Ref Range Status   Specimen Description BLOOD LEFT ARM  Final   Special Requests BOTTLES DRAWN AEROBIC AND ANAEROBIC 5CC  Final   Culture   Final    NO GROWTH 3 DAYS Performed at [facility]    Report Status PENDING  Incomplete  Culture, blood (routine x 2) Call MD if unable to obtain prior to antibiotics being given     Status: None (Preliminary result)   Collection Time: 06/01/16  4:34 AM  Result Value Ref Range Status   Specimen Description BLOOD LEFT HAND  Final   Special Requests BOTTLES DRAWN AEROBIC AND ANAEROBIC 5CC  Final   Culture   Final    NO GROWTH 3 DAYS Performed at [facility]    Report Status PENDING  Incomplete     Labs: Basic Metabolic Panel:  Recent Labs Lab 05/31/16 1913 06/01/16 0434 06/02/16 0444 06/03/16 0422 06/04/16 0457  NA 138 139 140 144 143  K 4.0 3.9 3.7 4.3 4.0  CL 105 109 112* 109 107  CO2 24 25 25 27 29   GLUCOSE 126* 103* 94 90 96  BUN 17 14 10 11 12   CREATININE 1.20* 0.98 0.80 0.97 0.90  CALCIUM 9.1 7.8* 8.1* 9.1 9.0  MG  --  1.9 2.1  --   --   PHOS  --  2.1* 2.1*  --   --    Liver Function Tests:  Recent Labs Lab 05/31/16 1913 06/01/16 0434 06/02/16 0444 06/03/16 0422 06/04/16 0457  AST 98* 51* 30 29 27   ALT 71* 44 33 29 27  ALKPHOS 143* 92 83 84 74  BILITOT 0.5 0.8 0.7 0.8 0.4  PROT 7.1 5.1* 5.4* 6.0* 5.4*  ALBUMIN 4.2 2.9* 2.9* 3.9 3.5   No  results for input(s): LIPASE, AMYLASE in the last 168 hours. No results for input(s): AMMONIA in the last 168 hours. CBC:  Recent Labs Lab 05/31/16 1913 06/01/16 0434 06/02/16 0444  WBC 18.8* 13.8* 7.0  NEUTROABS 16.6* 10.9* 3.2  HGB 13.8 10.5* 10.3*  HCT 41.6 31.5* 30.9*  MCV 89.8 90.5 90.6  PLT 299 254 226   Cardiac Enzymes: No results for input(s): CKTOTAL, CKMB, CKMBINDEX, TROPONINI in the last 168 hours. BNP: Invalid input(s): POCBNP CBG: No results for input(s): GLUCAP in the last 168 hours.  Time coordinating discharge:  Greater than 30 minutes  Signed:  Bakari Nikolai, DO [facility] Pager: [phone] 06/04/2016, 2:51 PM

## 2016-06-04 NOTE — Care Management Note (Signed)
Case Management Note  Patient Details  Name: Deanna Shields MRN: [id number] Date of Birth: [birth year]  Subjective/Objective: 82 y/o f admitted w/PNA. From home.                   Action/Plan:d/c home.   Expected Discharge Date:                  Expected Discharge Plan:  Home/Self Care  In-House Referral:     Discharge planning Services  CM Consult  Post Acute Care Choice:    Choice offered to:     DME Arranged:    DME Agency:     HH Arranged:    [place] Agency:     Status of Service:  Completed, signed off  If discussed at [employer] of Stay Meetings, dates discussed:    Additional Comments:  Dessa Phi, RN 06/04/2016, 2:40 PM

## 2016-06-04 NOTE — Consult Note (Signed)
Referring Provider: [facility]  - Dr. Carles Collet Primary Care Physician:  [name], DO Primary Gastroenterologist:  Zenovia Jarred, MD  Reason for Consultation:  CBD stones  HPI: Deanna Shields is a 82 y.o. female who saw [place] in July with nausea / vomiting. Found to having gallstones, referred to surgery and underwent cholecystectomy late August.  Patient presented to ED 4 days ago with N/V/D as well as respiratory symptoms. Noncontrast CTscan of abdomen negative for stomach or bowel abnormalities, + hiatal hernia with distal esophageal thickening seen. There was at least CBD stone seen within a mild dilated CBD. Other findings included right middle lobe PNA. She was admitted with CAP. Strep pneumo urinary Ag was positive.   Past Medical History:  Diagnosis Date  . Arthritis    "right thumb" (01/29/2015)  . Chronic shoulder pain    "between my shoulders" (01/29/2015)  . Esophageal stricture 2009  . Gallstones   . GERD (gastroesophageal reflux disease) 2004  . Hemorrhoids 2004  . Hiatal hernia 2004  . History of blood transfusion 1947   "appendix ruptured"  . HOH (hard of hearing)   . Hypertension   . Rhinitis, allergic   . Syncopal episodes     Past Surgical History:  Procedure Laterality Date  . APPENDECTOMY  1947   "ruptured"  . CARDIAC CATHETERIZATION  10/23/1999; 09/2014   EF 60% No significant CAD; patent coronary arteries.  . CARPAL TUNNEL RELEASE Right 1990's  . CHOLECYSTECTOMY N/A 01/29/2015   Procedure: LAPAROSCOPIC CHOLECYSTECTOMY;  Surgeon: Rolm Bookbinder, MD;  Location: [place];  Service: General;  Laterality: N/A;  . DILATION AND CURETTAGE OF UTERUS  "couple"  . ESOPHAGOGASTRODUODENOSCOPY (EGD) WITH ESOPHAGEAL DILATION  2-3 times  . HEMORROIDECTOMY    . KNEE ARTHROSCOPY Bilateral    right x 2  . LAPAROSCOPIC CHOLECYSTECTOMY  01/29/2015  . LEFT HEART CATHETERIZATION WITH CORONARY ANGIOGRAM N/A 09/16/2014   Procedure: LEFT HEART CATHETERIZATION WITH  CORONARY ANGIOGRAM;  Surgeon: [name], MD;  Location: [facility] CATH LAB;  Service: Cardiovascular;  Laterality: N/A;  . NM MYOVIEW LTD  06/01/2012   EF 71%  . VAGINAL HYSTERECTOMY  1970's    Prior to Admission medications   Medication Sig Start Date End Date Taking? Authorizing Provider  ALPRAZolam (XANAX) 0.25 MG tablet Take 1 tablet (0.25 mg total) by mouth 3 (three) times daily as needed for anxiety. 05/10/16  Yes Yvonne R Lowne Chase, DO  amLODipine (NORVASC) 2.5 MG tablet Take 1 tablet (2.5 mg total) by mouth daily. 08/27/15  Yes [name], MD  aspirin 81 MG tablet Take 81 mg by mouth daily.    Yes Historical Provider, MD  Calcium Citrate (CITRACAL PO) Take 1 tablet by mouth daily.   Yes Historical Provider, MD  chlorpheniramine-HYDROcodone (TUSSIONEX PENNKINETIC ER) 10-8 MG/5ML SUER Take 5 mLs by mouth every 12 (twelve) hours as needed for cough. 05/10/16  Yes Yvonne R Lowne Chase, DO  losartan (COZAAR) 50 MG tablet Take 1 tablet (50 mg total) by mouth daily. 12/25/15  Yes Yvonne R Lowne Chase, DO  methocarbamol (ROBAXIN) 500 MG tablet Take 1 tablet (500 mg total) by mouth 4 (four) times daily as needed for muscle spasms. 04/20/16  Yes Yvonne R Lowne Chase, DO  omeprazole (PRILOSEC) 40 MG capsule TAKE 1 CAPSULE DAILY Patient taking differently: TAKE 40 mg CAPSULE by mouth once DAILY as needed for heartburn 04/13/16  Yes Rosalita Chessman Chase, DO    Current Facility-Administered Medications  Medication Dose Route Frequency Provider Last Rate Last Dose  . ALPRAZolam Duanne Moron) tablet 0.25 mg  0.25 mg Oral TID PRN Etta Quill, DO   0.25 mg at 06/01/16 2244  . amLODipine (NORVASC) tablet 2.5 mg  2.5 mg Oral Daily Etta Quill, DO   2.5 mg at 06/04/16 0947  . aspirin EC tablet 81 mg  81 mg Oral Daily Etta Quill, DO   81 mg at 06/04/16 0102  . cefTRIAXone (ROCEPHIN) 1 g in dextrose 5 % 50 mL IVPB  1 g Intravenous Q24H Lacretia Leigh, MD   Stopped at 06/03/16 2150  .  chlorpheniramine-HYDROcodone (TUSSIONEX) 10-8 MG/5ML suspension 5 mL  5 mL Oral Q12H PRN Etta Quill, DO   5 mL at 06/04/16 0951  . enoxaparin (LOVENOX) injection 40 mg  40 mg Subcutaneous QHS Etta Quill, DO   40 mg at 06/03/16 2108  . loperamide (IMODIUM) capsule 2 mg  2 mg Oral PRN Lacretia Leigh, MD      . losartan (COZAAR) tablet 50 mg  50 mg Oral Daily Etta Quill, DO   50 mg at 06/04/16 0947  . methocarbamol (ROBAXIN) tablet 500 mg  500 mg Oral QID PRN Etta Quill, DO   500 mg at 06/01/16 2244  . ondansetron (ZOFRAN) injection 4 mg  4 mg Intravenous Q6H PRN Etta Quill, DO      . pantoprazole (PROTONIX) EC tablet 40 mg  40 mg Oral Daily Etta Quill, DO   40 mg at 06/04/16 7253    Allergies as of 05/31/2016 - Review Complete 05/31/2016  Allergen Reaction Noted  . Contrast media [iodinated diagnostic agents] Itching and Rash 01/22/2015  . Codeine Nausea And Vomiting 11/22/2006    Family History  Problem Relation Age of Onset  . Stroke Father 80    light stroke  . Heart disease Sister   . Coronary artery disease Neg Hx   . Diabetes Neg Hx   . Cancer Neg Hx     breast, colon, prostate    Social History   Social History  . Marital status: Married    Spouse name: N/A  . Number of children: 0  . Years of education: N/A   Occupational History  . retired Retired    Geneticist, molecular   Social History Main Topics  . Smoking status: Never Smoker  . Smokeless tobacco: Never Used  . Alcohol use No  . Drug use: No  . Sexual activity: No   Other Topics Concern  . Not on file   Social History Narrative   Patient doesn't get regular exercise   married    Review of Systems: All systems reviewed and negative except where noted in HPI.  Physical Exam: Vital signs in last 24 hours: Temp:  [97.4 F (36.3 C)-98.5 F (36.9 C)] 98.5 F (36.9 C) (12/29 0500) Pulse Rate:  [65-71] 65 (12/29 0500) Resp:  [16-17] 16 (12/29 0500) BP: (145-185)/(58-73)  185/72 (12/29 0947) SpO2:  [98 %-99 %] 98 % (12/29 0500) Last BM Date: 06/01/16 General:   Alert, well-developed, well-nourished, pleasant and cooperative in NAD Head:  Normocephalic and atraumatic. Eyes:  Sclera clear, no icterus.   Conjunctiva pink. Ears:  Normal auditory acuity. Nose:  No deformity, discharge,  or lesions. Neck:  Supple; no masses. Lungs:  Clear throughout to auscultation.   No wheezes, crackles, or rhonchi.  Heart:  Regular rate and rhythm; no murmurs, trace BLE edema Abdomen:  Soft,nontender, BS active,nonpalp mass.   Rectal:  Deferred  Msk:  Symmetrical without gross deformities. . Pulses:  Normal pulses noted. Extremities:  Without clubbing or edema. Neurologic:  Alert and  oriented x4;  grossly normal neurologically. Skin:  Intact without significant lesions or rashes.. Psych:  Alert and cooperative. Normal mood and affect.  Intake/Output from previous day: 12/28 0701 - 12/29 0700 In: 26 [P.O.:540; IV Piggyback:75] Out: -  Intake/Output this shift: Total I/O In: 240 [P.O.:240] Out: -   Lab Results:  Recent Labs  06/02/16 0444  WBC 7.0  HGB 10.3*  HCT 30.9*  PLT 226   BMET  Recent Labs  06/02/16 0444 06/03/16 0422 06/04/16 0457  NA 140 144 143  K 3.7 4.3 4.0  CL 112* 109 107  CO2 25 27 29   GLUCOSE 94 90 96  BUN 10 11 12   CREATININE 0.80 0.97 0.90  CALCIUM 8.1* 9.1 9.0   LFT  Recent Labs  06/04/16 0457  PROT 5.4*  ALBUMIN 3.5  AST 27  ALT 27  ALKPHOS 74  BILITOT 0.4    Studies/Results: Mr 3d Recon At Scanner  Result Date: 06/04/2016 CLINICAL DATA:  82 year old female with history of nausea, vomiting and diarrhea with and elevated liver function tests. Evaluate for potential choledocholithiasis. EXAM: MRI ABDOMEN WITHOUT AND WITH CONTRAST (INCLUDING MRCP) TECHNIQUE: Multiplanar multisequence MR imaging of the abdomen was performed both before and after the administration of intravenous contrast. Heavily T2-weighted images  of the biliary and pancreatic ducts were obtained, and three-dimensional MRCP images were rendered by post processing. CONTRAST:  16 mL of MultiHance COMPARISON:  No prior abdominal MRI. CT the abdomen and pelvis 05/31/2016. FINDINGS: Lower chest: Unremarkable. Hepatobiliary: No cystic or solid hepatic lesions. Status post cholecystectomy. MRCP images demonstrate some mild common bile duct dilatation, but no definite intrahepatic biliary ductal dilatation. Common bile duct measures up to 11 mm in the porta hepatis. Within the common bile duct there are 3 filling defects located in the proximal common bile duct immediately adjacent to the junction of common hepatic and cystic ducts, and in the mid common bile duct, measuring up to 8 mm in diameter in the mid common bile duct. Pancreas: No pancreatic mass. No pancreatic ductal dilatation on MRCP images. No pancreatic or peripancreatic fluid or inflammatory changes. Spleen:  Unremarkable. Adrenals/Urinary Tract: Bilateral kidneys and bilateral adrenal glands are normal in appearance. No hydroureteronephrosis in the visualized abdomen. Stomach/Bowel: Visualized portions are unremarkable. Vascular/Lymphatic: Atherosclerosis in the visualized abdominal vasculature, without definite aneurysm. No lymphadenopathy noted in the abdomen. Other: Incompletely visualized ventral hernia which appears to contain some omental fat and loops of small bowel. No significant volume of ascites noted in the visualized portions of the peritoneal cavity. Musculoskeletal: No aggressive osseous lesions are noted in the visualized portions of the skeleton. IMPRESSION: 1. Study is positive for choledocholithiasis with 3 filling defects in the common bile duct measuring up to 8 mm in diameter in the mid common bile duct. Common bile duct is mildly dilated measuring 11 mm. However, there is no intrahepatic biliary ductal dilatation to suggest frank biliary obstruction. Common bile duct dilatation  in this setting could simply be a reflection of post cholecystectomy physiology and patient's advanced age. 2. Aortic atherosclerosis. 3. Small ventral hernia. Electronically Signed   By: Vinnie Langton M.D.   On: 06/04/2016 07:41   Mr Abdomen Mrcp Moise Boring Contast  Result Date: 06/04/2016 CLINICAL DATA:  82 year old female with history of nausea, vomiting and  diarrhea with and elevated liver function tests. Evaluate for potential choledocholithiasis. EXAM: MRI ABDOMEN WITHOUT AND WITH CONTRAST (INCLUDING MRCP) TECHNIQUE: Multiplanar multisequence MR imaging of the abdomen was performed both before and after the administration of intravenous contrast. Heavily T2-weighted images of the biliary and pancreatic ducts were obtained, and three-dimensional MRCP images were rendered by post processing. CONTRAST:  16 mL of MultiHance COMPARISON:  No prior abdominal MRI. CT the abdomen and pelvis 05/31/2016. FINDINGS: Lower chest: Unremarkable. Hepatobiliary: No cystic or solid hepatic lesions. Status post cholecystectomy. MRCP images demonstrate some mild common bile duct dilatation, but no definite intrahepatic biliary ductal dilatation. Common bile duct measures up to 11 mm in the porta hepatis. Within the common bile duct there are 3 filling defects located in the proximal common bile duct immediately adjacent to the junction of common hepatic and cystic ducts, and in the mid common bile duct, measuring up to 8 mm in diameter in the mid common bile duct. Pancreas: No pancreatic mass. No pancreatic ductal dilatation on MRCP images. No pancreatic or peripancreatic fluid or inflammatory changes. Spleen:  Unremarkable. Adrenals/Urinary Tract: Bilateral kidneys and bilateral adrenal glands are normal in appearance. No hydroureteronephrosis in the visualized abdomen. Stomach/Bowel: Visualized portions are unremarkable. Vascular/Lymphatic: Atherosclerosis in the visualized abdominal vasculature, without definite aneurysm. No  lymphadenopathy noted in the abdomen. Other: Incompletely visualized ventral hernia which appears to contain some omental fat and loops of small bowel. No significant volume of ascites noted in the visualized portions of the peritoneal cavity. Musculoskeletal: No aggressive osseous lesions are noted in the visualized portions of the skeleton. IMPRESSION: 1. Study is positive for choledocholithiasis with 3 filling defects in the common bile duct measuring up to 8 mm in diameter in the mid common bile duct. Common bile duct is mildly dilated measuring 11 mm. However, there is no intrahepatic biliary ductal dilatation to suggest frank biliary obstruction. Common bile duct dilatation in this setting could simply be a reflection of post cholecystectomy physiology and patient's advanced age. 2. Aortic atherosclerosis. 3. Small ventral hernia. Electronically Signed   By: Vinnie Langton M.D.   On: 06/04/2016 07:41    IMPRESSION / PLAN:   1. 82 yo female admitted with nausea, vomiting, diarrhea and cough. Treated for CAP. Incidental finding choledocholithiasis on CTscan, MRCP does show mild proximal dilation of proximal CBD and 3 filling defects. She is asymtomatic, normal LFTs. Patient is ready for discharge from medical standpoint. She is stable for discharge from a GI standpoint as well. We will see her in clinic. When CAP has resolved patient will be scheduled for ERCP with stone extraction. Gastrointestinal symptoms have resolved, probably viral gastroenteritis.  -Appt with me on 06/17/16 at 1:30pm.   2. GERD, stable on PPI. Some distal esophageal thickening / Hiatal hernia on CTscan which can be further addressed as outpatient. Last EGD 2014.  -continue home PPI  Tye Savoy NP  06/04/2016, 1:10 PM  Pager number [phone]  I have reviewed the entire case in detail with the above APP and discussed the plan in detail.  Therefore, I agree with the diagnoses recorded above. In addition,  I have  personally interviewed and examined the patient and have personally reviewed any abdominal/pelvic CT scan images.  My additional thoughts are as follows:  CC: choledocholithiasis  Stones present since at least 7/17 imaging, LFTs presently normal and recovering from pneumococcal PNA.  We will see in clinic soon and plan outpatient ERCP.    Nelida Meuse III Pager  [phone]  Mon-Fri 8a-5p [phone] after 5p, weekends, holidays

## 2016-06-06 LAB — CULTURE, BLOOD (ROUTINE X 2)
CULTURE: NO GROWTH
Culture: NO GROWTH

## 2016-06-17 ENCOUNTER — Ambulatory Visit: Payer: Medicare Other | Admitting: Nurse Practitioner

## 2016-07-23 ENCOUNTER — Other Ambulatory Visit: Payer: Self-pay | Admitting: *Deleted

## 2016-07-23 MED ORDER — AMLODIPINE BESYLATE 2.5 MG PO TABS: 2.5000 mg | ORAL_TABLET | Freq: Every day | ORAL | 3 refills | Status: DC

## 2016-07-27 ENCOUNTER — Other Ambulatory Visit: Payer: Self-pay

## 2016-07-27 MED ORDER — LOSARTAN POTASSIUM 50 MG PO TABS: 50.0000 mg | ORAL_TABLET | Freq: Every day | ORAL | 1 refills | Status: DC

## 2016-08-23 ENCOUNTER — Other Ambulatory Visit: Payer: Self-pay | Admitting: Family Medicine

## 2016-08-23 DIAGNOSIS — R05 Cough: Secondary | ICD-10-CM

## 2016-08-23 DIAGNOSIS — R059 Cough, unspecified: Secondary | ICD-10-CM

## 2016-08-23 NOTE — Telephone Encounter (Signed)
Self.    Refill request for Hydrocodone   Pharmacy: [facility]

## 2016-08-24 ENCOUNTER — Telehealth: Payer: Self-pay | Admitting: Family Medicine

## 2016-08-24 MED ORDER — HYDROCOD POLST-CPM POLST ER 10-8 MG/5ML PO SUER: 5.0000 mL | Freq: Two times a day (BID) | ORAL | 0 refills | Status: DC | PRN

## 2016-08-24 NOTE — Telephone Encounter (Signed)
Rx signed and faxed to [facility]. Left detailed message on pt's home # and to call if any questions.

## 2016-08-24 NOTE — Telephone Encounter (Signed)
Rx printed and forwarded to PCP for signature. 

## 2016-08-24 NOTE — Telephone Encounter (Signed)
Patient would like a refill on Tussionex.  She is having a dry cough spell again.  Last fill 05/10/16 Last ov 05/10/16 Next ov due 11/08/16 No contract No UDS

## 2016-08-24 NOTE — Telephone Encounter (Signed)
Refill x1 

## 2016-08-24 NOTE — Telephone Encounter (Signed)
Spoke with patient regarding awv. Patient stated that she would give office a call when she is ready to schedule appt.

## 2016-08-25 MED FILL — HYDROCODONE-CHLORPHENIRAM S: 10-8 | 14 days supply | Qty: 140 | Fill #0

## 2016-08-25 NOTE — Telephone Encounter (Signed)
Previous rx not able to be faxed due to hydrocodone component. Rx taken downstairs to pharmacy and hardcopy given to [facility]. Pt has been notified.

## 2016-09-28 ENCOUNTER — Ambulatory Visit (INDEPENDENT_AMBULATORY_CARE_PROVIDER_SITE_OTHER): Payer: Medicare Other | Admitting: Family Medicine

## 2016-09-28 ENCOUNTER — Encounter: Payer: Self-pay | Admitting: Family Medicine

## 2016-09-28 VITALS — BP 130/76 | HR 74 | Temp 98.4°F | Resp 16 | Ht 66.0 in | Wt 178.0 lb

## 2016-09-28 DIAGNOSIS — D72829 Elevated white blood cell count, unspecified: Secondary | ICD-10-CM | POA: Diagnosis not present

## 2016-09-28 DIAGNOSIS — I10 Essential (primary) hypertension: Secondary | ICD-10-CM

## 2016-09-28 DIAGNOSIS — F439 Reaction to severe stress, unspecified: Secondary | ICD-10-CM

## 2016-09-28 DIAGNOSIS — G4700 Insomnia, unspecified: Secondary | ICD-10-CM | POA: Diagnosis not present

## 2016-09-28 LAB — COMPREHENSIVE METABOLIC PANEL
ALBUMIN: 4.3 g/dL (ref 3.5–5.2)
ALT: 13 U/L (ref 0–35)
AST: 23 U/L (ref 0–37)
Alkaline Phosphatase: 75 U/L (ref 39–117)
BILIRUBIN TOTAL: 0.7 mg/dL (ref 0.2–1.2)
BUN: 22 mg/dL (ref 6–23)
CALCIUM: 10.2 mg/dL (ref 8.4–10.5)
CO2: 30 mEq/L (ref 19–32)
CREATININE: 1.1 mg/dL (ref 0.40–1.20)
Chloride: 101 mEq/L (ref 96–112)
GFR: 50.05 mL/min — ABNORMAL LOW (ref >60.00)
Glucose, Bld: 100 mg/dL — ABNORMAL HIGH (ref 70–99)
Potassium: 4.8 mEq/L (ref 3.5–5.1)
Sodium: 138 mEq/L (ref 135–145)
TOTAL PROTEIN: 7 g/dL (ref 6.0–8.3)

## 2016-09-28 LAB — LIPID PANEL
CHOLESTEROL: 212 mg/dL — AB (ref 0–200)
HDL: 51.8 mg/dL (ref >39.00)
LDL CALC: 132 mg/dL — AB (ref 0–99)
NonHDL: 159.81
TRIGLYCERIDES: 137 mg/dL (ref 0.0–149.0)
Total CHOL/HDL Ratio: 4
VLDL: 27.4 mg/dL (ref 0.0–40.0)

## 2016-09-28 LAB — CBC WITH DIFFERENTIAL/PLATELET
BASOS ABS: 0.1 10*3/uL (ref 0.0–0.1)
Basophils Relative: 0.7 % (ref 0.0–3.0)
EOS ABS: 0.3 10*3/uL (ref 0.0–0.7)
Eosinophils Relative: 2.7 % (ref 0.0–5.0)
HEMATOCRIT: 40.6 % (ref 36.0–46.0)
HEMOGLOBIN: 13.3 g/dL (ref 12.0–15.0)
LYMPHS PCT: 29.7 % (ref 12.0–46.0)
Lymphs Abs: 2.7 10*3/uL (ref 0.7–4.0)
MCHC: 32.7 g/dL (ref 30.0–36.0)
MCV: 90 fl (ref 78.0–100.0)
MONO ABS: 0.6 10*3/uL (ref 0.1–1.0)
Monocytes Relative: 6.6 % (ref 3.0–12.0)
Neutro Abs: 5.5 10*3/uL (ref 1.4–7.7)
Neutrophils Relative %: 60.3 % (ref 43.0–77.0)
PLATELETS: 283 10*3/uL (ref 150.0–400.0)
RBC: 4.51 Mil/uL (ref 3.87–5.11)
RDW: 16.1 % — ABNORMAL HIGH (ref 11.5–15.5)
WBC: 9.1 10*3/uL (ref 4.0–10.5)

## 2016-09-28 MED ORDER — TRAZODONE HCL 50 MG PO TABS: 25.0000 mg | ORAL_TABLET | Freq: Every evening | ORAL | 2 refills | Status: DC | PRN

## 2016-09-28 MED FILL — traZODone HCL 50 MG TABS: 50 | 30 days supply | Qty: 30 | Fill #0

## 2016-09-28 NOTE — Progress Notes (Signed)
Pre visit review using our clinic review tool, if applicable. No additional management support is needed unless otherwise documented below in the visit note. 

## 2016-09-28 NOTE — Progress Notes (Signed)
Patient ID: Deanna Shields, female   DOB: 08/16/1928, 83 y.o.   MRN: [id number]    Subjective:  I acted as a Education administrator for Dr. Carollee Herter.  Guerry Bruin, [place]   Patient ID: Deanna Shields, female    DOB: 1928/10/25, 83 y.o.   MRN: [id number]  Chief Complaint  Patient presents with  . Insomnia    HPI  Patient is in today for insomnia.  She just cannot sleep.  She rolls and tumbles all through out the night.  Has been this way since husband died last year in 2023-04-29.   She takes her Xanax once a day in the morning.  It does not make her sleepy.  Pt does not snore.    Patient Care Team: [name], DO as PCP - General [name], MD as Consulting Physician (Cardiology)   Past Medical History:  Diagnosis Date  . Arthritis    "right thumb" (01/29/2015)  . Chronic shoulder pain    "between my shoulders" (01/29/2015)  . Dizziness   . Esophageal stricture 2009  . Gallstones   . GERD (gastroesophageal reflux disease) 2004  . Hemorrhoids 2004  . Hiatal hernia 2004  . History of blood transfusion 1947   "appendix ruptured"  . HOH (hard of hearing)   . Hypertension   . Rhinitis, allergic   . Syncopal episodes     Past Surgical History:  Procedure Laterality Date  . APPENDECTOMY  1947   "ruptured"  . CARDIAC CATHETERIZATION  10/23/1999; 09/2014   EF 60% No significant CAD; patent coronary arteries.  . CARPAL TUNNEL RELEASE Right 1990's  . CHOLECYSTECTOMY N/A 01/29/2015   Procedure: LAPAROSCOPIC CHOLECYSTECTOMY;  Surgeon: Rolm Bookbinder, MD;  Location: [facility];  Service: General;  Laterality: N/A;  . DILATION AND CURETTAGE OF UTERUS  "couple"  . ESOPHAGOGASTRODUODENOSCOPY (EGD) WITH ESOPHAGEAL DILATION  2-3 times  . HEMORROIDECTOMY    . KNEE ARTHROSCOPY Bilateral    right x 2  . LAPAROSCOPIC CHOLECYSTECTOMY  01/29/2015  . LEFT HEART CATHETERIZATION WITH CORONARY ANGIOGRAM N/A 09/16/2014   Procedure: LEFT HEART CATHETERIZATION WITH CORONARY ANGIOGRAM;  Surgeon: [name], MD;  Location: [facility] CATH LAB;  Service: Cardiovascular;  Laterality: N/A;  . NM MYOVIEW LTD  06/01/2012   EF 71%  . VAGINAL HYSTERECTOMY  1970's    Family History  Problem Relation Age of Onset  . Stroke Father 76    light stroke  . Heart disease Sister   . Coronary artery disease Neg Hx   . Diabetes Neg Hx   . Cancer Neg Hx     breast, colon, prostate    Social History   Social History  . Marital status: Married    Spouse name: N/A  . Number of children: 0  . Years of education: N/A   Occupational History  . retired Retired    Geneticist, molecular   Social History Main Topics  . Smoking status: Never Smoker  . Smokeless tobacco: Never Used  . Alcohol use No  . Drug use: No  . Sexual activity: No   Other Topics Concern  . Not on file   Social History Narrative   Patient doesn't get regular exercise   married    Outpatient Medications Prior to Visit  Medication Sig Dispense Refill  . amLODipine (NORVASC) 2.5 MG tablet Take 1 tablet (2.5 mg total) by mouth daily. 90 tablet 3  . aspirin 81 MG tablet Take 81 mg by mouth daily.     [name]  Calcium Citrate (CITRACAL PO) Take 1 tablet by mouth daily.    . chlorpheniramine-HYDROcodone (TUSSIONEX PENNKINETIC ER) 10-8 MG/5ML SUER Take 5 mLs by mouth every 12 (twelve) hours as needed for cough. 140 mL 0  . losartan (COZAAR) 50 MG tablet Take 1 tablet (50 mg total) by mouth daily. 90 tablet 1  . methocarbamol (ROBAXIN) 500 MG tablet Take 1 tablet (500 mg total) by mouth 4 (four) times daily as needed for muscle spasms. 40 tablet 0  . omeprazole (PRILOSEC) 40 MG capsule TAKE 1 CAPSULE DAILY (Patient taking differently: TAKE 40 mg CAPSULE by mouth once DAILY as needed for heartburn) 90 capsule 3  . ALPRAZolam (XANAX) 0.25 MG tablet Take 1 tablet (0.25 mg total) by mouth 3 (three) times daily as needed for anxiety. (Patient taking differently: Take 0.25 mg by mouth daily. ) 30 tablet 0  . cefdinir (OMNICEF) 300 MG capsule Take 1  capsule (300 mg total) by mouth 2 (two) times daily. 6 capsule 0   No facility-administered medications prior to visit.     Allergies  Allergen Reactions  . Contrast Media [Iodinated Diagnostic Agents] Itching and Rash    Pt covered in rash, red.  . Codeine Nausea And Vomiting    in large doses: can tolerate Tussionex    Review of Systems  Constitutional: Negative for fever and malaise/fatigue.  HENT: Negative for congestion.   Eyes: Negative for blurred vision.  Respiratory: Negative for cough and shortness of breath.   Cardiovascular: Negative for chest pain, palpitations and leg swelling.  Gastrointestinal: Negative for vomiting.  Musculoskeletal: Negative for back pain.  Skin: Negative for rash.  Neurological: Negative for loss of consciousness and headaches.  Psychiatric/Behavioral: The patient has insomnia.        Objective:    Physical Exam  Constitutional: She is oriented to person, place, and time. She appears well-developed and well-nourished. No distress.  HENT:  Head: Normocephalic and atraumatic.  Eyes: Conjunctivae are normal.  Neck: Normal range of motion. No thyromegaly present.  Cardiovascular: Normal rate and regular rhythm.   Pulmonary/Chest: Effort normal and breath sounds normal. She has no wheezes.  Abdominal: Soft. Bowel sounds are normal. There is no tenderness.  Musculoskeletal: Normal range of motion. She exhibits no edema or deformity.  Neurological: She is alert and oriented to person, place, and time.  Skin: Skin is warm and dry. She is not diaphoretic.  Psychiatric: She has a normal mood and affect.    BP 130/76 (BP Location: Left Arm, Cuff Size: Normal)   Pulse 74   Temp 98.4 F (36.9 C) (Oral)   Resp 16   Ht 5\' 6"  (1.676 m)   Wt 178 lb (80.7 kg)   SpO2 97%   BMI 28.73 kg/m  Wt Readings from Last 3 Encounters:  09/28/16 178 lb (80.7 kg)  05/31/16 172 lb (78 kg)  05/10/16 176 lb 3.2 oz (79.9 kg)   BP Readings from Last 3  Encounters:  09/28/16 130/76  06/04/16 (!) 185/72  05/10/16 (!) 142/68     Immunization History  Administered Date(s) Administered  . Influenza Split 03/16/2011, 03/01/2012  . Influenza Whole 03/28/2007, 04/01/2009, 02/18/2010  . Influenza, High Dose Seasonal PF 03/14/2013  . Influenza,inj,Quad PF,36+ Mos 03/20/2014, 03/11/2015  . Pneumococcal Conjugate-13 02/25/2015  . Pneumococcal Polysaccharide-23 03/28/2007  . Td 12/20/2005    Health Maintenance  Topic Date Due  . MAMMOGRAM  09/15/2013  . TETANUS/TDAP  12/22/2016 (Originally 12/21/2015)  . INFLUENZA VACCINE  05/10/2017 (Originally  01/05/2017)  . DEXA SCAN  Completed  . PNA vac Low Risk Adult  Completed    Lab Results  Component Value Date   WBC 9.1 09/28/2016   HGB 13.3 09/28/2016   HCT 40.6 09/28/2016   PLT 283.0 09/28/2016   GLUCOSE 100 (H) 09/28/2016   CHOL 212 (H) 09/28/2016   TRIG 137.0 09/28/2016   HDL 51.80 09/28/2016   LDLDIRECT 161.2 07/18/2013   LDLCALC 132 (H) 09/28/2016   ALT 13 09/28/2016   AST 23 09/28/2016   NA 138 09/28/2016   K 4.8 09/28/2016   CL 101 09/28/2016   CREATININE 1.10 09/28/2016   BUN 22 09/28/2016   CO2 30 09/28/2016   TSH 2.780 10/28/2013   INR 0.99 09/09/2014    Lab Results  Component Value Date   TSH 2.780 10/28/2013   Lab Results  Component Value Date   WBC 9.1 09/28/2016   HGB 13.3 09/28/2016   HCT 40.6 09/28/2016   MCV 90.0 09/28/2016   PLT 283.0 09/28/2016   Lab Results  Component Value Date   NA 138 09/28/2016   K 4.8 09/28/2016   CO2 30 09/28/2016   GLUCOSE 100 (H) 09/28/2016   BUN 22 09/28/2016   CREATININE 1.10 09/28/2016   BILITOT 0.7 09/28/2016   ALKPHOS 75 09/28/2016   AST 23 09/28/2016   ALT 13 09/28/2016   PROT 7.0 09/28/2016   ALBUMIN 4.3 09/28/2016   CALCIUM 10.2 09/28/2016   ANIONGAP 7 06/04/2016   GFR 50.05 (L) 09/28/2016   Lab Results  Component Value Date   CHOL 212 (H) 09/28/2016   Lab Results  Component Value Date   HDL 51.80  09/28/2016   Lab Results  Component Value Date   LDLCALC 132 (H) 09/28/2016   Lab Results  Component Value Date   TRIG 137.0 09/28/2016   Lab Results  Component Value Date   CHOLHDL 4 09/28/2016   No results found for: HGBA1C       Assessment & Plan:   Problem List Items Addressed This Visit      Unprioritized   Essential hypertension - Primary (Chronic)   Relevant Orders   Lipid panel (Completed)   Comprehensive metabolic panel (Completed)    Other Visit Diagnoses    Situational stress       Insomnia, unspecified type       Relevant Medications   traZODone (DESYREL) 50 MG tablet   Leukocytosis, unspecified type       Relevant Orders   CBC with Differential/Platelet (Completed)      I have discontinued Ms. Opperman's cefdinir. I am also having her start on traZODone. Additionally, I am having her maintain her aspirin, Calcium Citrate (CITRACAL PO), omeprazole, methocarbamol, amLODipine, losartan, chlorpheniramine-HYDROcodone, and ALPRAZolam.  Meds ordered this encounter  Medications  . ALPRAZolam (XANAX) 0.25 MG tablet    Sig: Take 0.25 mg by mouth daily as needed for anxiety.  . traZODone (DESYREL) 50 MG tablet    Sig: Take 0.5-1 tablets (25-50 mg total) by mouth at bedtime as needed for sleep.    Dispense:  30 tablet    Refill:  2    CMA served as scribe during this visit. History, Physical and Plan performed by medical provider. Documentation and orders reviewed and attested to.  [name], DO

## 2016-09-28 NOTE — Patient Instructions (Addendum)
Start the trazodone with 1/2 tablet at bedtime to see if that works.  If not you can increase to 1 tablet at bedtime.    Insomnia Insomnia is a sleep disorder that makes it difficult to fall asleep or to stay asleep. Insomnia can cause tiredness (fatigue), low energy, difficulty concentrating, mood swings, and poor performance at work or school. There are three different ways to classify insomnia:  Difficulty falling asleep.  Difficulty staying asleep.  Waking up too early in the morning. Any type of insomnia can be long-term (chronic) or short-term (acute). Both are common. Short-term insomnia usually lasts for three months or less. Chronic insomnia occurs at least three times a week for longer than three months. What are the causes? Insomnia may be caused by another condition, situation, or substance, such as:  Anxiety.  Certain medicines.  Gastroesophageal reflux disease (GERD) or other gastrointestinal conditions.  Asthma or other breathing conditions.  Restless legs syndrome, sleep apnea, or other sleep disorders.  Chronic pain.  Menopause. This may include hot flashes.  Stroke.  Abuse of alcohol, tobacco, or illegal drugs.  Depression.  Caffeine.  Neurological disorders, such as Alzheimer disease.  An overactive thyroid (hyperthyroidism). The cause of insomnia may not be known. What increases the risk? Risk factors for insomnia include:  Gender. Women are more commonly affected than men.  Age. Insomnia is more common as you get older.  Stress. This may involve your professional or personal life.  Income. Insomnia is more common in people with lower income.  Lack of exercise.  Irregular work schedule or night shifts.  Traveling between different time zones. What are the signs or symptoms? If you have insomnia, trouble falling asleep or trouble staying asleep is the main symptom. This may lead to other symptoms, such as:  Feeling fatigued.  Feeling  nervous about going to sleep.  Not feeling rested in the morning.  Having trouble concentrating.  Feeling irritable, anxious, or depressed. How is this treated? Treatment for insomnia depends on the cause. If your insomnia is caused by an underlying condition, treatment will focus on addressing the condition. Treatment may also include:  Medicines to help you sleep.  Counseling or therapy.  Lifestyle adjustments. Follow these instructions at home:  Take medicines only as directed by your health care provider.  Keep regular sleeping and waking hours. Avoid naps.  Keep a sleep diary to help you and your health care provider figure out what could be causing your insomnia. Include:  When you sleep.  When you wake up during the night.  How well you sleep.  How rested you feel the next day.  Any side effects of medicines you are taking.  What you eat and drink.  Make your bedroom a comfortable place where it is easy to fall asleep:  Put up shades or special blackout curtains to block light from outside.  Use a white noise machine to block noise.  Keep the temperature cool.  Exercise regularly as directed by your health care provider. Avoid exercising right before bedtime.  Use relaxation techniques to manage stress. Ask your health care provider to suggest some techniques that may work well for you. These may include:  Breathing exercises.  Routines to release muscle tension.  Visualizing peaceful scenes.  Cut back on alcohol, caffeinated beverages, and cigarettes, especially close to bedtime. These can disrupt your sleep.  Do not overeat or eat spicy foods right before bedtime. This can lead to digestive discomfort that can make it  hard for you to sleep.  Limit screen use before bedtime. This includes:  Watching TV.  Using your smartphone, tablet, and computer.  Stick to a routine. This can help you fall asleep faster. Try to do a quiet activity, brush your  teeth, and go to bed at the same time each night.  Get out of bed if you are still awake after 15 minutes of trying to sleep. Keep the lights down, but try reading or doing a quiet activity. When you feel sleepy, go back to bed.  Make sure that you drive carefully. Avoid driving if you feel very sleepy.  Keep all follow-up appointments as directed by your health care provider. This is important. Contact a health care provider if:  You are tired throughout the day or have trouble in your daily routine due to sleepiness.  You continue to have sleep problems or your sleep problems get worse. Get help right away if:  You have serious thoughts about hurting yourself or someone else. This information is not intended to replace advice given to you by your health care provider. Make sure you discuss any questions you have with your health care provider. Document Released: 05/21/2000 Document Revised: 10/24/2015 Document Reviewed: 02/22/2014 Elsevier Interactive Patient Education  2017 [employer].

## 2016-09-29 ENCOUNTER — Other Ambulatory Visit: Payer: Self-pay | Admitting: Family Medicine

## 2016-09-29 ENCOUNTER — Telehealth: Payer: Self-pay | Admitting: Family Medicine

## 2016-09-29 DIAGNOSIS — E785 Hyperlipidemia, unspecified: Secondary | ICD-10-CM

## 2016-09-29 NOTE — Telephone Encounter (Signed)
Self.  Refill for [place]

## 2016-09-30 MED ORDER — HYDROCOD POLST-CPM POLST ER 10-8 MG/5ML PO SUER: 5.0000 mL | Freq: Two times a day (BID) | ORAL | 0 refills | Status: DC | PRN

## 2016-09-30 NOTE — Telephone Encounter (Signed)
Refill x1 

## 2016-09-30 NOTE — Telephone Encounter (Signed)
Printed/PCP signed/called the patient informed to pickup hardcopy at the front desk.

## 2016-10-01 MED FILL — HYDROCODONE-CHLORPHENIRAM S: 10-8 | 14 days supply | Qty: 140 | Fill #0

## 2016-11-05 ENCOUNTER — Encounter: Payer: Self-pay | Admitting: Cardiovascular Disease

## 2016-11-05 ENCOUNTER — Ambulatory Visit (INDEPENDENT_AMBULATORY_CARE_PROVIDER_SITE_OTHER): Payer: Medicare Other | Admitting: Cardiovascular Disease

## 2016-11-05 VITALS — BP 162/80 | HR 97 | Ht 66.0 in | Wt 179.0 lb

## 2016-11-05 DIAGNOSIS — I10 Essential (primary) hypertension: Secondary | ICD-10-CM | POA: Diagnosis not present

## 2016-11-05 NOTE — Patient Instructions (Addendum)
Medication Instructions: Your physician recommends that you continue on your current medications as directed. Please refer to the Current Medication list given to you today.  STOP Aspirin 81 mg   Follow-Up: We request that you follow-up in: 6 months with an extender and in 12 months with Dr Andria Rhein will receive a reminder letter in the mail two months in advance. If you don't receive a letter, please call our office to schedule the follow-up appointment.  If you need a refill on your cardiac medications before your next appointment, please call your pharmacy.

## 2016-11-05 NOTE — Progress Notes (Signed)
11/05/2016 Deanna Shields   1928-12-15  [id number]  Primary Physician Deanna Held, DO Primary Cardiologist: Deanna Shields Deanna Shields  HPI:  The patient is a delightful 83 year old, mildly overweight, married Caucasian female with no children whose husband [name] was also a long-term patient of mine. Unfortunately, he had mesothelioma treated by Deanna Shields secondary to remote asbestos exposure and was in hospice.Deanna Shields He expired October of last year of a ruptured abdominal aortic aneurysm. They were married for close to 60 years. I last saw her in the office 08/27/15/.Deanna Shields   She was remotely a patient of Dr. Alla Shields. She had a cath done by him Oct 23, 1999, after an abnormal Myoview done because of chest pain that showed minimal CAD.Deanna Shields Her risk factors at that time were family history and hyperlipidemia. She does not smoke or drink alcohol. She has been on statin drugs in the past.  Deanna Shields was admitted to the hospital 10/28/13 for 2 days because of chest pain/rule out myocardial function. A 2-D echo and Myoview stress test were normal. She was seen back a week later because of witnessed syncope. Over the ensuing  months she's noticed jaw pain with associated with left shoulder arm back and chest pain with increasing dyspnea on exertion. A myoview stress test was performed that showed moderate inferoapical ischemia. A 2-D echo was essentially normal. She underwent outpatient diagnostic coronary arteriography by myself 09/16/14 which was essentially normal.   Since I saw her in the office a year ago she started remarkably well. Unfortunately she lost her husband approximately 6 months ago of 18 years. She does complain of some dyspnea on exertion.   Current Outpatient Prescriptions  Medication Sig Dispense Refill  . ALPRAZolam (XANAX) 0.25 MG tablet Take 0.25 mg by mouth daily as needed for anxiety.    Deanna Shields amLODipine (NORVASC) 2.5 MG tablet Take 1 tablet (2.5 mg total) by  mouth daily. 90 tablet 3  . aspirin 81 MG tablet Take 81 mg by mouth daily.     . Calcium Citrate (CITRACAL PO) Take 1 tablet by mouth daily.    . chlorpheniramine-HYDROcodone (TUSSIONEX PENNKINETIC ER) 10-8 MG/5ML SUER Take 5 mLs by mouth every 12 (twelve) hours as needed for cough. 140 mL 0  . losartan (COZAAR) 50 MG tablet Take 1 tablet (50 mg total) by mouth daily. 90 tablet 1  . methocarbamol (ROBAXIN) 500 MG tablet Take 1 tablet (500 mg total) by mouth 4 (four) times daily as needed for muscle spasms. 40 tablet 0  . omeprazole (PRILOSEC) 40 MG capsule TAKE 1 CAPSULE DAILY (Patient taking differently: TAKE 40 mg CAPSULE by mouth once DAILY as needed for heartburn) 90 capsule 3  . traZODone (DESYREL) 50 MG tablet Take 0.5-1 tablets (25-50 mg total) by mouth at bedtime as needed for sleep. 30 tablet 2   No current facility-administered medications for this visit.     Allergies  Allergen Reactions  . Contrast Media [Iodinated Diagnostic Agents] Itching and Rash    Pt covered in rash, red.  . Codeine Nausea And Vomiting    in large doses: can tolerate Tussionex    Social History   Social History  . Marital status: Married    Spouse name: N/A  . Number of children: 0  . Years of education: N/A   Occupational History  . retired Retired    Geneticist, molecular   Social History Main Topics  . Smoking status: Never  Smoker  . Smokeless tobacco: Never Used  . Alcohol use No  . Drug use: No  . Sexual activity: No   Other Topics Concern  . Not on file   Social History Narrative   Patient doesn't get regular exercise   married     Review of Systems: General: negative for chills, fever, night sweats or weight changes.  Cardiovascular: negative for chest pain, dyspnea on exertion, edema, orthopnea, palpitations, paroxysmal nocturnal dyspnea or shortness of breath Dermatological: negative for rash Respiratory: negative for cough or wheezing Urologic: negative for  hematuria Abdominal: negative for nausea, vomiting, diarrhea, bright red blood per rectum, melena, or hematemesis Neurologic: negative for visual changes, syncope, or dizziness All other systems reviewed and are otherwise negative except as noted above.    Blood pressure (!) 162/80, pulse 97, height 5\' 6"  (1.676 m), weight 179 lb (81.2 kg), SpO2 96 %.  General appearance: alert and no distress Neck: no adenopathy, no carotid bruit, no JVD, supple, symmetrical, trachea midline and thyroid not enlarged, symmetric, no tenderness/mass/nodules Lungs: clear to auscultation bilaterally Heart: regular rate and rhythm, S1, S2 normal, no murmur, click, rub or gallop Extremities: extremities normal, atraumatic, no cyanosis or edema  EKG sinus rhythm at 76 without ST or T-wave changes. I personally reviewed this EKG  ASSESSMENT AND PLAN:   Essential hypertension History of essential hypertension with blood pressure measured 162/80. She is on amlodipine and losartan. Continue current meds at current dosing      Deanna Shields [facility], [facility] 11/05/2016 11:01 AM

## 2016-11-05 NOTE — Assessment & Plan Note (Signed)
History of essential hypertension with blood pressure measured 162/80. She is on amlodipine and losartan. Continue current meds at current dosing

## 2016-11-21 ENCOUNTER — Emergency Department (HOSPITAL_COMMUNITY)
Admission: EM | Admit: 2016-11-21 | Discharge: 2016-11-21 | Disposition: A | Discharge status: Home / Self Care | Payer: Medicare Other | Attending: Emergency Medicine | Admitting: Emergency Medicine

## 2016-11-21 ENCOUNTER — Encounter (HOSPITAL_COMMUNITY): Payer: Self-pay | Admitting: Emergency Medicine

## 2016-11-21 DIAGNOSIS — R404 Transient alteration of awareness: Secondary | ICD-10-CM | POA: Diagnosis not present

## 2016-11-21 DIAGNOSIS — I10 Essential (primary) hypertension: Secondary | ICD-10-CM | POA: Insufficient documentation

## 2016-11-21 DIAGNOSIS — Z7902 Long term (current) use of antithrombotics/antiplatelets: Secondary | ICD-10-CM | POA: Diagnosis not present

## 2016-11-21 DIAGNOSIS — Z79899 Other long term (current) drug therapy: Secondary | ICD-10-CM | POA: Insufficient documentation

## 2016-11-21 DIAGNOSIS — Z885 Allergy status to narcotic agent status: Secondary | ICD-10-CM | POA: Diagnosis not present

## 2016-11-21 DIAGNOSIS — R55 Syncope and collapse: Secondary | ICD-10-CM | POA: Diagnosis not present

## 2016-11-21 DIAGNOSIS — Z91041 Radiographic dye allergy status: Secondary | ICD-10-CM | POA: Insufficient documentation

## 2016-11-21 LAB — URINALYSIS, ROUTINE W REFLEX MICROSCOPIC
BILIRUBIN URINE: NEGATIVE
Glucose, UA: NEGATIVE mg/dL
Hgb urine dipstick: NEGATIVE
KETONES UR: NEGATIVE mg/dL
LEUKOCYTES UA: NEGATIVE
NITRITE: NEGATIVE
PH: 5.5 (ref 5.0–8.0)
Protein, ur: NEGATIVE mg/dL
Specific Gravity, Urine: 1.02 (ref 1.005–1.030)

## 2016-11-21 LAB — CBC WITH DIFFERENTIAL/PLATELET
Basophils Absolute: 0.1 10*3/uL (ref 0.0–0.1)
Basophils Relative: 1 %
Eosinophils Absolute: 0.1 10*3/uL (ref 0.0–0.7)
Eosinophils Relative: 1 %
HCT: 37.8 % (ref 36.0–46.0)
Hemoglobin: 12.4 g/dL (ref 12.0–15.0)
Lymphocytes Relative: 16 %
Lymphs Abs: 1.4 10*3/uL (ref 0.7–4.0)
MCH: 29.5 pg (ref 26.0–34.0)
MCHC: 32.8 g/dL (ref 30.0–36.0)
MCV: 90 fL (ref 78.0–100.0)
Monocytes Absolute: 0.6 10*3/uL (ref 0.1–1.0)
Monocytes Relative: 7 %
Neutro Abs: 6.9 10*3/uL (ref 1.7–7.7)
Neutrophils Relative %: 75 %
Platelets: 282 10*3/uL (ref 150–400)
RBC: 4.2 MIL/uL (ref 3.87–5.11)
RDW: 15.8 % — ABNORMAL HIGH (ref 11.5–15.5)
WBC: 9.1 10*3/uL (ref 4.0–10.5)

## 2016-11-21 LAB — BASIC METABOLIC PANEL
Anion gap: 7 (ref 5–15)
BUN: 19 mg/dL (ref 6–20)
CO2: 25 mmol/L (ref 22–32)
Calcium: 8.9 mg/dL (ref 8.9–10.3)
Chloride: 107 mmol/L (ref 101–111)
Creatinine, Ser: 1.15 mg/dL — ABNORMAL HIGH (ref 0.44–1.00)
GFR calc Af Amer: 48 mL/min — ABNORMAL LOW (ref >60)
GFR calc non Af Amer: 42 mL/min — ABNORMAL LOW (ref >60)
Glucose, Bld: 117 mg/dL — ABNORMAL HIGH (ref 65–99)
Potassium: 4.3 mmol/L (ref 3.5–5.1)
Sodium: 139 mmol/L (ref 135–145)

## 2016-11-21 LAB — CBG MONITORING, ED: Glucose-Capillary: 111 mg/dL — ABNORMAL HIGH (ref 65–99)

## 2016-11-21 LAB — TROPONIN I

## 2016-11-21 MED ORDER — SODIUM CHLORIDE 0.9 % IV BOLUS (SEPSIS)
1000.0000 mL | Freq: Once | INTRAVENOUS | Status: AC
  Administered 2016-11-21: 1000 mL via INTRAVENOUS

## 2016-11-21 NOTE — ED Triage Notes (Signed)
Per EMS, patient from [facility]. Patient was at lunch, suddenly "slumped" over and became unconscious. Patient was taken to the hallway via wheelchair where she began to arouse. Patient neg for EMS on stroke screen. Patient alert on arrival to ER. C/O nausea.

## 2016-11-21 NOTE — ED Provider Notes (Signed)
[facility] DEPT Provider Note   CSN: [id number] Arrival date & time: 11/21/16  1405     History   Chief Complaint Chief Complaint  Patient presents with  . Loss of Consciousness    HPI [place] D Ratti is a 83 y.o. female.  HPI   83 year old female with a past medical history hypertension presents today with near syncopal episode.  Patient's nephew and friends are at bedside who provides majority of the history.  Her friends reports they were having lunch with the patient.  She was in no acute distress and in her usual state of health.  They note that after eating patient went to stand up and sat immediately back down.  She appeared nauseous and then her head fell backwards.  She was still sitting in the chair and did not fall out.  Nursing staff wheeled her over to the nurses station where they proceeded to check her vital signs.  Patient notes prior to this episode she was in no acute distress.  She denies any preceding chest pain, she notes very minor shortness of breath, dizziness and cannot recall the events surrounding her syncope.  She notes after waking up she felt extremely tired, she denies any shortness of breath, chest pain, nausea, abdominal pain or palpitations.  Both patient and her nephew noted an identical episode that happened approximately 2 years ago after eating.  She was seen in the hospital hobs overnight and discharged home with no significant findings.  Patient denies any neurological deficits.   Past Medical History:  Diagnosis Date  . Arthritis    "right thumb" (01/29/2015)  . Chronic shoulder pain    "between my shoulders" (01/29/2015)  . Dizziness   . Esophageal stricture 2009  . Gallstones   . GERD (gastroesophageal reflux disease) 2004  . Hemorrhoids 2004  . Hiatal hernia 2004  . History of blood transfusion 1947   "appendix ruptured"  . HOH (hard of hearing)   . Hypertension   . Rhinitis, allergic   . Syncopal episodes     Patient  Active Problem List   Diagnosis Date Noted  . Transaminasemia 06/02/2016  . Choledocholithiasis   . Right middle lobe pneumonia ([place]) 05/31/2016  . CAP (community acquired pneumonia) 05/31/2016  . Nausea vomiting and diarrhea 05/31/2016  . S/P laparoscopic cholecystectomy 01/29/2015  . Pain in the chest   . Abnormal nuclear stress test   . Allergic rhinitis 07/12/2014  . Viral stomatitis 07/05/2014  . Syncope 11/10/2013  . Hyponatremia 11/10/2013  . Chest pain 10/28/2013  . Dizziness 10/19/2013  . Near syncope 10/19/2013  . Leg cramps 12/25/2012  . Bilateral claudication of lower limb ([place]) 12/25/2012  . Stress reaction 09/15/2012  . Hearing loss of right ear 07/31/2012  . Retinal tear 11/03/2011  . ABDOMINAL PAIN, RIGHT UPPER QUADRANT 06/18/2010  . TINEA CRURIS 10/29/2009  . CERUMEN IMPACTION, RIGHT 07/28/2009  . NECK PAIN, RIGHT 07/28/2009  . DIZZINESS 07/28/2009  . UNSPECIFIED VITAMIN D DEFICIENCY 06/16/2009  . B12 DEFICIENCY 04/01/2009  . Shortness of breath 02/03/2009  . DYSURIA 06/04/2008  . INTERNAL HEMORRHOIDS 04/22/2008  . HIATAL HERNIA 04/22/2008  . GERD 03/21/2008  . Allergic rhinitis, cause unspecified 08/10/2007  . OSTEOPENIA 03/22/2007  . BACKACHE NOS 02/23/2007  . COUGH, CHRONIC 02/23/2007  . Essential hypertension 11/17/2006  . DEPENDENT EDEMA 11/17/2006    Past Surgical History:  Procedure Laterality Date  . APPENDECTOMY  1947   "ruptured"  . CARDIAC CATHETERIZATION  10/23/1999; 09/2014  EF 60% No significant CAD; patent coronary arteries.  . CARPAL TUNNEL RELEASE Right 1990's  . CHOLECYSTECTOMY N/A 01/29/2015   Procedure: LAPAROSCOPIC CHOLECYSTECTOMY;  Surgeon: Rolm Bookbinder, MD;  Location: [facility];  Service: General;  Laterality: N/A;  . DILATION AND CURETTAGE OF UTERUS  "couple"  . ESOPHAGOGASTRODUODENOSCOPY (EGD) WITH ESOPHAGEAL DILATION  2-3 times  . HEMORROIDECTOMY    . KNEE ARTHROSCOPY Bilateral    right x 2  . LAPAROSCOPIC  CHOLECYSTECTOMY  01/29/2015  . LEFT HEART CATHETERIZATION WITH CORONARY ANGIOGRAM N/A 09/16/2014   Procedure: LEFT HEART CATHETERIZATION WITH CORONARY ANGIOGRAM;  Surgeon: [name], MD;  Location: [facility] CATH LAB;  Service: Cardiovascular;  Laterality: N/A;  . NM MYOVIEW LTD  06/01/2012   EF 71%  . VAGINAL HYSTERECTOMY  1970's    OB History    No data available       Home Medications    Prior to Admission medications   Medication Sig Start Date End Date Taking? Authorizing Provider  ALPRAZolam Duanne Moron) 0.25 MG tablet Take 0.25 mg by mouth daily as needed for anxiety.    [provider]  amLODipine (NORVASC) 2.5 MG tablet Take 1 tablet (2.5 mg total) by mouth daily. 07/23/16   [name], MD  Calcium Citrate (CITRACAL PO) Take 1 tablet by mouth daily.    [provider]  chlorpheniramine-HYDROcodone (TUSSIONEX PENNKINETIC ER) 10-8 MG/5ML SUER Take 5 mLs by mouth every 12 (twelve) hours as needed for cough. 09/30/16   [name], DO  losartan (COZAAR) 50 MG tablet Take 1 tablet (50 mg total) by mouth daily. 07/27/16   [name], DO  methocarbamol (ROBAXIN) 500 MG tablet Take 1 tablet (500 mg total) by mouth 4 (four) times daily as needed for muscle spasms. 04/20/16   [name], DO  omeprazole (PRILOSEC) 40 MG capsule TAKE 1 CAPSULE DAILY Patient taking differently: TAKE 40 mg CAPSULE by mouth once DAILY as needed for heartburn 04/13/16   Carollee Herter, Alferd Apa, DO  traZODone (DESYREL) 50 MG tablet Take 0.5-1 tablets (25-50 mg total) by mouth at bedtime as needed for sleep. 09/28/16   [name], DO    Family History Family History  Problem Relation Age of Onset  . Stroke Father 68       light stroke  . Heart disease Sister   . Coronary artery disease Neg Hx   . Diabetes Neg Hx   . Cancer Neg Hx        breast, colon, prostate    Social History Social History  Substance Use Topics  . Smoking status: Never Smoker   . Smokeless tobacco: Never Used  . Alcohol use No     Allergies   Contrast media [iodinated diagnostic agents] and Codeine   Review of Systems Review of Systems  All other systems reviewed and are negative.   Physical Exam Updated Vital Signs BP (!) 172/69   Pulse 73   Temp 98.4 F (36.9 C) (Oral)   Resp 13   Ht 5\' 5"  (1.651 m)   Wt 79.4 kg (175 lb)   SpO2 98%   BMI 29.12 kg/m   Physical Exam  Constitutional: She is oriented to person, place, and time. She appears well-developed and well-nourished.  HENT:  Head: Normocephalic and atraumatic.  Eyes: Conjunctivae are normal. Pupils are equal, round, and reactive to light. Right eye exhibits no discharge. Left eye exhibits no discharge. No scleral icterus.  Neck:  Normal range of motion. No JVD present. No tracheal deviation present.  Cardiovascular: Normal rate, regular rhythm, normal heart sounds and intact distal pulses.  Exam reveals no gallop and no friction rub.   No murmur heard. Pulmonary/Chest: Effort normal. No stridor.  Abdominal: Soft. She exhibits no distension and no mass. There is no tenderness. There is no rebound and no guarding. No hernia.  Neurological: She is alert and oriented to person, place, and time. She has normal strength. No cranial nerve deficit. Coordination normal. GCS eye subscore is 4. GCS verbal subscore is 5. GCS motor subscore is 6.  Skin: Skin is warm.  Psychiatric: She has a normal mood and affect. Her behavior is normal. Judgment and thought content normal.  Nursing note and vitals reviewed.    ED Treatments / Results  Labs (all labs ordered are listed, but only abnormal results are displayed) Labs Reviewed  CBC WITH DIFFERENTIAL/PLATELET - Abnormal; Notable for the following:       Result Value   RDW 15.8 (*)    All other components within normal limits  BASIC METABOLIC PANEL - Abnormal; Notable for the following:    Glucose, Bld 117 (*)    Creatinine, Ser 1.15 (*)    GFR  calc non Af Amer 42 (*)    GFR calc Af Amer 48 (*)    All other components within normal limits  CBG MONITORING, ED - Abnormal; Notable for the following:    Glucose-Capillary 111 (*)    All other components within normal limits  URINALYSIS, ROUTINE W REFLEX MICROSCOPIC  TROPONIN I    EKG  EKG Interpretation  Date/Time:  Sunday November 21 2016 14:52:32 EDT Ventricular Rate:  75 PR Interval:    QRS Duration: 104 QT Interval:  405 QTC Calculation: 453 R Axis:   -35 Text Interpretation:  Sinus rhythm Atrial premature complex Left axis deviation Confirmed by Tanna Furry [phone]) on 11/21/2016 2:55:01 PM       Radiology No results found.  Procedures Procedures (including critical care time)  Medications Ordered in ED Medications  sodium chloride 0.9 % bolus 1,000 mL (0 mLs Intravenous Stopped 11/21/16 1638)     Initial Impression / Assessment and Plan / ED Course  I have reviewed the triage vital signs and the nursing notes.  Pertinent labs & imaging results that were available during my care of the patient were reviewed by me and considered in my medical decision making (see chart for details).      Final Clinical Impressions(s) / ED Diagnoses   Final diagnoses:  Near syncope   Labs: Troponin, CBG, CBC, BMP and urinalysis  Imaging:  Consults:  Therapeutics: Normal saline  Discharge Meds:   Assessment/Plan: 83 year old female presents today with near syncopal episode.  Patient slightly fatigued here but in no acute distress.  This was likely orthostatic in nature, she had no concerning proceeding symptoms.  She is well-appearing ambulating here in the ED without dizziness or concerning findings.  Patient's workup here shows no significant findings that necessitate further evaluation or management here in the ED.  Patient will be instructed to continue hydration, follow up with primary care return immediately if any new or worsening signs or symptoms present.Pt  verbalized understanding and agreement to today's plan had no further questions or concerns at the time of discharge.     New Prescriptions Discharge Medication List as of 11/21/2016  6:24 PM       Okey Regal, PA-C 11/21/16 2014  Isla Pence, MD 11/29/16 [phone]

## 2016-11-21 NOTE — Discharge Instructions (Signed)
Please read attached information. If you experience any new or worsening signs or symptoms please return to the emergency room for evaluation. Please follow-up with your primary care provider or specialist as discussed.  °

## 2016-11-21 NOTE — ED Notes (Signed)
Bed: WA09 Expected date:  Expected time:  Means of arrival:  Comments: EMS-syncope 

## 2016-11-25 ENCOUNTER — Telehealth: Payer: Self-pay | Admitting: Family Medicine

## 2016-11-25 DIAGNOSIS — G4700 Insomnia, unspecified: Secondary | ICD-10-CM

## 2016-11-25 MED ORDER — TRAZODONE HCL 50 MG PO TABS: 25.0000 mg | ORAL_TABLET | Freq: Every evening | ORAL | 2 refills | Status: DC | PRN

## 2016-11-25 NOTE — Telephone Encounter (Signed)
Requesting:   trazodone Contract   none UDS   none Last OV    09/28/2016 Last Refill   #30 with 2 refills on 09/28/2016  Please Advise-----[facility] [phone]

## 2016-11-25 NOTE — Telephone Encounter (Signed)
Refill x1   2 refills 

## 2016-11-25 NOTE — Telephone Encounter (Signed)
Sent in trazodone to mail order

## 2016-12-06 ENCOUNTER — Other Ambulatory Visit: Payer: Self-pay | Admitting: Family Medicine

## 2016-12-06 NOTE — Telephone Encounter (Signed)
Caller name: [place] Relation to pt: self  Call back number: [phone] Pharmacy: [facility]  Reason for call: Pt is requesting refill on chlorpheniramine-HYDROcodone (TUSSIONEX PENNKINETIC ER) 10-8 MG/5ML SURE. Please advise.

## 2016-12-06 NOTE — Telephone Encounter (Signed)
rx refill for tussionex  Patient has chronic cough  Last filled 09/30/16  183ml Last ov 09/28/16

## 2016-12-06 NOTE — Telephone Encounter (Signed)
OK to refill cough syrup

## 2016-12-07 MED ORDER — HYDROCOD POLST-CPM POLST ER 10-8 MG/5ML PO SUER: 5.0000 mL | Freq: Two times a day (BID) | ORAL | 0 refills | Status: DC | PRN

## 2016-12-07 NOTE — Telephone Encounter (Signed)
Printed prescription/Provider signed Called the patient left detailed message cough medication is ready for pickup at the front desk.

## 2016-12-10 MED FILL — HYDROCODONE-CHLORPHENIRAM S: 10-8 | 14 days supply | Qty: 140 | Fill #0

## 2016-12-28 ENCOUNTER — Other Ambulatory Visit (INDEPENDENT_AMBULATORY_CARE_PROVIDER_SITE_OTHER): Payer: Medicare Other

## 2016-12-28 ENCOUNTER — Other Ambulatory Visit: Payer: Self-pay

## 2016-12-28 DIAGNOSIS — E785 Hyperlipidemia, unspecified: Secondary | ICD-10-CM

## 2016-12-28 LAB — LIPID PANEL
CHOLESTEROL: 210 mg/dL — AB (ref 0–200)
HDL: 48.7 mg/dL (ref >39.00)
LDL Cholesterol: 139 mg/dL — ABNORMAL HIGH (ref 0–99)
NONHDL: 160.82
Total CHOL/HDL Ratio: 4
Triglycerides: 111 mg/dL (ref 0.0–149.0)
VLDL: 22.2 mg/dL (ref 0.0–40.0)

## 2016-12-28 LAB — COMPREHENSIVE METABOLIC PANEL
ALBUMIN: 3.9 g/dL (ref 3.5–5.2)
ALK PHOS: 72 U/L (ref 39–117)
ALT: 12 U/L (ref 0–35)
AST: 19 U/L (ref 0–37)
BILIRUBIN TOTAL: 0.5 mg/dL (ref 0.2–1.2)
BUN: 19 mg/dL (ref 6–23)
CALCIUM: 9.8 mg/dL (ref 8.4–10.5)
CO2: 30 mEq/L (ref 19–32)
Chloride: 104 mEq/L (ref 96–112)
Creatinine, Ser: 1.2 mg/dL (ref 0.40–1.20)
GFR: 45.24 mL/min — AB (ref >60.00)
GLUCOSE: 93 mg/dL (ref 70–99)
POTASSIUM: 4.5 meq/L (ref 3.5–5.1)
Sodium: 140 mEq/L (ref 135–145)
TOTAL PROTEIN: 6.2 g/dL (ref 6.0–8.3)

## 2016-12-29 ENCOUNTER — Telehealth: Payer: Self-pay | Admitting: Behavioral Health

## 2016-12-29 ENCOUNTER — Telehealth: Payer: Self-pay | Admitting: Family Medicine

## 2016-12-29 DIAGNOSIS — E785 Hyperlipidemia, unspecified: Secondary | ICD-10-CM

## 2016-12-29 MED ORDER — ATORVASTATIN CALCIUM 20 MG PO TABS: 20.0000 mg | ORAL_TABLET | Freq: Every day | ORAL | 2 refills | Status: DC

## 2016-12-29 NOTE — Telephone Encounter (Signed)
Self.   Called in for lab results  CB: [phone]

## 2016-12-29 NOTE — Telephone Encounter (Addendum)
Informed patient of the below results & provider's recommendations. She voiced understanding & did not have any further questions or concerns prior to call ending. 3 month lab appointment scheduled for 03/31/17 at 7:30 AM. Future lab orders for lipid & CMP has been placed. Rx for Lipitor sent to [facility] per patient's request.    ----- Message from [name], DO sent at 12/28/2016 10:58 PM EDT ----- Cholesterol--- LDL goal < 100,  HDL >40,  TG < 150.  Diet and exercise will increase HDL and decrease LDL and TG.  Fish,  Fish Oil, Flaxseed oil will also help increase the HDL and decrease Triglycerides.   Recheck labs in 3 months-- start lipitor 20 mg #30 1 po qhs, 2 refills Lipid, cmp-- lab only.

## 2016-12-30 NOTE — Telephone Encounter (Signed)
Patient was informed of results 

## 2017-01-07 ENCOUNTER — Telehealth: Payer: Self-pay | Admitting: Family Medicine

## 2017-01-07 MED ORDER — HYDROCOD POLST-CPM POLST ER 10-8 MG/5ML PO SUER: 5.0000 mL | Freq: Two times a day (BID) | ORAL | 0 refills | Status: DC | PRN

## 2017-01-07 NOTE — Telephone Encounter (Signed)
Relation to BM:SXJD Call back number:[phone] Pharmacy: [facility] [place], [place] AT [place]  [phone] (Phone) [phone] (Fax)   D.O.D Dr.Paz  Reason for call:  Patient requesting a refill chlorpheniramine-HYDROcodone (TUSSIONEX PENNKINETIC ER) 10-8 MG/5ML SUER due to her cough

## 2017-01-07 NOTE — Telephone Encounter (Signed)
Please advise 

## 2017-01-07 NOTE — Telephone Encounter (Signed)
Patient take this medication chronically per chart review. Ok RF 140 mL's 1,

## 2017-01-07 NOTE — Telephone Encounter (Signed)
Rx printed, awaiting MD signature.  

## 2017-01-07 NOTE — Telephone Encounter (Signed)
LMOM informing Pt that Rx has been placed at front desk for pick up at her convenience.

## 2017-01-10 MED FILL — HYDROCODONE-CHLORPHENIRAM S: 10-8 | 14 days supply | Qty: 140 | Fill #0

## 2017-01-20 DIAGNOSIS — M5431 Sciatica, right side: Secondary | ICD-10-CM | POA: Diagnosis not present

## 2017-01-20 DIAGNOSIS — M5136 Other intervertebral disc degeneration, lumbar region: Secondary | ICD-10-CM | POA: Diagnosis not present

## 2017-01-20 DIAGNOSIS — M5137 Other intervertebral disc degeneration, lumbosacral region: Secondary | ICD-10-CM | POA: Diagnosis not present

## 2017-02-02 ENCOUNTER — Other Ambulatory Visit: Payer: Self-pay | Admitting: Family Medicine

## 2017-02-03 NOTE — Telephone Encounter (Signed)
Was told in April to F/U in 2 months/denied/needs OV/thx dmf

## 2017-02-14 ENCOUNTER — Other Ambulatory Visit: Payer: Self-pay | Admitting: Family Medicine

## 2017-02-14 ENCOUNTER — Other Ambulatory Visit: Payer: Self-pay

## 2017-02-14 MED ORDER — OMEPRAZOLE 40 MG PO CPDR: 40.0000 mg | DELAYED_RELEASE_CAPSULE | Freq: Every day | ORAL | 1 refills | Status: DC

## 2017-02-15 ENCOUNTER — Encounter: Payer: Self-pay | Admitting: Family Medicine

## 2017-02-15 ENCOUNTER — Ambulatory Visit (INDEPENDENT_AMBULATORY_CARE_PROVIDER_SITE_OTHER): Payer: Medicare Other | Admitting: Family Medicine

## 2017-02-15 VITALS — BP 134/76 | HR 74 | Temp 97.7°F | Ht 65.0 in | Wt 178.8 lb

## 2017-02-15 DIAGNOSIS — H6121 Impacted cerumen, right ear: Secondary | ICD-10-CM | POA: Diagnosis not present

## 2017-02-15 DIAGNOSIS — R269 Unspecified abnormalities of gait and mobility: Secondary | ICD-10-CM

## 2017-02-15 DIAGNOSIS — R42 Dizziness and giddiness: Secondary | ICD-10-CM | POA: Diagnosis not present

## 2017-02-15 LAB — COMPREHENSIVE METABOLIC PANEL
ALT: 14 U/L (ref 0–35)
AST: 19 U/L (ref 0–37)
Albumin: 4.2 g/dL (ref 3.5–5.2)
Alkaline Phosphatase: 75 U/L (ref 39–117)
BILIRUBIN TOTAL: 0.8 mg/dL (ref 0.2–1.2)
BUN: 21 mg/dL (ref 6–23)
CHLORIDE: 101 meq/L (ref 96–112)
CO2: 29 meq/L (ref 19–32)
Calcium: 10.1 mg/dL (ref 8.4–10.5)
Creatinine, Ser: 1.03 mg/dL (ref 0.40–1.20)
GFR: 53.95 mL/min — AB (ref >60.00)
GLUCOSE: 94 mg/dL (ref 70–99)
Potassium: 4.9 mEq/L (ref 3.5–5.1)
Sodium: 139 mEq/L (ref 135–145)
TOTAL PROTEIN: 6.8 g/dL (ref 6.0–8.3)

## 2017-02-15 LAB — CBC WITH DIFFERENTIAL/PLATELET
BASOS ABS: 0 10*3/uL (ref 0.0–0.1)
Basophils Relative: 0.5 % (ref 0.0–3.0)
Eosinophils Absolute: 0.1 10*3/uL (ref 0.0–0.7)
Eosinophils Relative: 1.3 % (ref 0.0–5.0)
HEMATOCRIT: 41.4 % (ref 36.0–46.0)
Hemoglobin: 13.5 g/dL (ref 12.0–15.0)
LYMPHS ABS: 2.2 10*3/uL (ref 0.7–4.0)
LYMPHS PCT: 22 % (ref 12.0–46.0)
MCHC: 32.5 g/dL (ref 30.0–36.0)
MCV: 92.3 fl (ref 78.0–100.0)
MONOS PCT: 5.2 % (ref 3.0–12.0)
Monocytes Absolute: 0.5 10*3/uL (ref 0.1–1.0)
NEUTROS PCT: 71 % (ref 43.0–77.0)
Neutro Abs: 7.3 10*3/uL (ref 1.4–7.7)
Platelets: 248 10*3/uL (ref 150.0–400.0)
RBC: 4.48 Mil/uL (ref 3.87–5.11)
RDW: 16.5 % — ABNORMAL HIGH (ref 11.5–15.5)
WBC: 10.2 10*3/uL (ref 4.0–10.5)

## 2017-02-15 NOTE — Patient Instructions (Signed)

## 2017-02-15 NOTE — Progress Notes (Signed)
Patient ID: Deanna Shields, female    DOB: [birth year]  Age: 83 y.o. MRN: [id number]    Subjective:  Subjective  HPI [place] D Corro presents for dizziness that started yesterday.   + nausea , no vomiting.  No fevers, no chills.  No congestion, sore throat or sneezing.  No cp or palpitations, no sob.    Review of Systems  Constitutional: Negative for activity change, appetite change, chills, fatigue, fever and unexpected weight change.  HENT: Negative for congestion, ear discharge, ear pain, facial swelling, hearing loss, mouth sores, nosebleeds, postnasal drip, rhinorrhea, sinus pain, sinus pressure, sneezing, sore throat, tinnitus, trouble swallowing and voice change.   Respiratory: Negative for cough, chest tightness and shortness of breath.   Cardiovascular: Negative for chest pain and palpitations.  Gastrointestinal: Positive for nausea. Negative for abdominal distention, abdominal pain, anal bleeding, blood in stool, constipation, diarrhea and vomiting.  Neurological: Positive for dizziness and light-headedness. Negative for tremors, seizures, syncope, facial asymmetry, speech difficulty and weakness.  Psychiatric/Behavioral: Negative for behavioral problems and dysphoric mood. The patient is not nervous/anxious.     History Past Medical History:  Diagnosis Date  . Arthritis    "right thumb" (01/29/2015)  . Chronic shoulder pain    "between my shoulders" (01/29/2015)  . Dizziness   . Esophageal stricture 2009  . Gallstones   . GERD (gastroesophageal reflux disease) 2004  . Hemorrhoids 2004  . Hiatal hernia 2004  . History of blood transfusion 1947   "appendix ruptured"  . HOH (hard of hearing)   . Hypertension   . Rhinitis, allergic   . Syncopal episodes     She has a past surgical history that includes Vaginal hysterectomy (1970's); Hemorroidectomy; Knee arthroscopy (Bilateral); Carpal tunnel release (Right, 1990's); NM MYOVIEW LTD (06/01/2012); left heart  catheterization with coronary angiogram (N/A, 09/16/2014); Appendectomy (1947); Laparoscopic cholecystectomy (01/29/2015); Dilation and curettage of uterus ("couple"); Cardiac catheterization (10/23/1999; 09/2014); Esophagogastroduodenoscopy (egd) with esophageal dilation (2-3 times); and Cholecystectomy (N/A, 01/29/2015).   Her family history includes Heart disease in her sister; Stroke (age of onset: 3) in her father.She reports that she has never smoked. She has never used smokeless tobacco. She reports that she does not drink alcohol or use drugs.  Current Outpatient Prescriptions on File Prior to Visit  Medication Sig Dispense Refill  . ALPRAZolam (XANAX) 0.25 MG tablet Take 0.25 mg by mouth daily as needed for anxiety.    [name] amLODipine (NORVASC) 2.5 MG tablet Take 1 tablet (2.5 mg total) by mouth daily. 90 tablet 3  . atorvastatin (LIPITOR) 20 MG tablet Take 1 tablet (20 mg total) by mouth at bedtime. 30 tablet 2  . Calcium Citrate (CITRACAL PO) Take 1 tablet by mouth daily.    . chlorpheniramine-HYDROcodone (TUSSIONEX PENNKINETIC ER) 10-8 MG/5ML SUER Take 5 mLs by mouth every 12 (twelve) hours as needed for cough. 140 mL 0  . losartan (COZAAR) 50 MG tablet Take 1 tablet (50 mg total) by mouth daily. 90 tablet 1  . methocarbamol (ROBAXIN) 500 MG tablet Take 1 tablet (500 mg total) by mouth 4 (four) times daily as needed for muscle spasms. 40 tablet 0  . omeprazole (PRILOSEC) 40 MG capsule Take 1 capsule (40 mg total) by mouth daily. 90 capsule 1  . traZODone (DESYREL) 50 MG tablet Take 0.5-1 tablets (25-50 mg total) by mouth at bedtime as needed for sleep. 30 tablet 2   No current facility-administered medications on file prior to visit.  Objective:  Objective  Physical Exam  Constitutional: She is oriented to person, place, and time. She appears well-developed and well-nourished.  HENT:  Head: Normocephalic and atraumatic.  R ear -- + cerumen impaction Unable to remove with hoop and  unable to get it out with irrigation due to dizziness  Eyes: Conjunctivae and EOM are normal.  Neck: Normal range of motion. Neck supple. No JVD present. Carotid bruit is not present. No thyromegaly present.  Cardiovascular: Normal rate, regular rhythm and normal heart sounds.   No murmur heard. Pulmonary/Chest: Effort normal and breath sounds normal. No respiratory distress. She has no wheezes. She has no rales. She exhibits no tenderness.  Musculoskeletal: She exhibits no edema.  Neurological: She is alert and oriented to person, place, and time.  Psychiatric: She has a normal mood and affect.  Nursing note and vitals reviewed.  BP 134/76 (BP Location: Right Arm, Patient Position: Sitting, Cuff Size: Normal)   Pulse 74   Temp 97.7 F (36.5 C) (Oral)   Ht 5\' 5"  (1.651 m)   Wt 178 lb 12.8 oz (81.1 kg)   SpO2 97%   BMI 29.75 kg/m  Wt Readings from Last 3 Encounters:  02/15/17 178 lb 12.8 oz (81.1 kg)  11/21/16 175 lb (79.4 kg)  11/05/16 179 lb (81.2 kg)     Lab Results  Component Value Date   WBC 10.2 02/15/2017   HGB 13.5 02/15/2017   HCT 41.4 02/15/2017   PLT 248.0 02/15/2017   GLUCOSE 94 02/15/2017   CHOL 210 (H) 12/28/2016   TRIG 111.0 12/28/2016   HDL 48.70 12/28/2016   LDLDIRECT 161.2 07/18/2013   LDLCALC 139 (H) 12/28/2016   ALT 14 02/15/2017   AST 19 02/15/2017   NA 139 02/15/2017   K 4.9 02/15/2017   CL 101 02/15/2017   CREATININE 1.03 02/15/2017   BUN 21 02/15/2017   CO2 29 02/15/2017   TSH 2.780 10/28/2013   INR 0.99 09/09/2014    No results found.   Assessment & Plan:  Plan  I am having Ms. Soffer maintain her Calcium Citrate (CITRACAL PO), methocarbamol, amLODipine, losartan, ALPRAZolam, traZODone, atorvastatin, chlorpheniramine-HYDROcodone, and omeprazole.  No orders of the defined types were placed in this encounter.   Problem List Items Addressed This Visit      Unprioritized   Cerumen impaction    Unable to irrigate -- pt because too  dizzy Use debrox otc and rto  At the end of the week and we can try again       DIZZINESS    Use antihistamine and flonase  rto prn May be due to cerumen as well Call or rto if symptoms worsen or do not improve       Other Visit Diagnoses    Dizzy    -  Primary   Relevant Orders   CBC with Differential/Platelet (Completed)   Comprehensive metabolic panel (Completed)   Gait disturbance       Relevant Orders   Comprehensive metabolic panel (Completed)      Follow-up: Return in about 3 days (around 02/18/2017), or ear irrigation.  [name], DO

## 2017-02-16 NOTE — Assessment & Plan Note (Addendum)
Use antihistamine and flonase  rto prn May be due to cerumen as well Call or rto if symptoms worsen or do not improve

## 2017-02-16 NOTE — Assessment & Plan Note (Signed)
Unable to irrigate -- pt because too dizzy Use debrox otc and rto  At the end of the week and we can try again

## 2017-02-18 ENCOUNTER — Ambulatory Visit: Payer: Medicare Other | Admitting: Family Medicine

## 2017-02-25 ENCOUNTER — Ambulatory Visit: Payer: Medicare Other | Admitting: Family Medicine

## 2017-03-01 ENCOUNTER — Other Ambulatory Visit: Payer: Self-pay

## 2017-03-01 MED ORDER — METHOCARBAMOL 500 MG PO TABS: 500.0000 mg | ORAL_TABLET | Freq: Four times a day (QID) | ORAL | 0 refills | Status: DC | PRN

## 2017-03-23 ENCOUNTER — Telehealth: Payer: Self-pay | Admitting: Family Medicine

## 2017-03-23 NOTE — Telephone Encounter (Signed)
Refill request for chlorpheniramine-HYDROcodone   Please call pt for pick up.

## 2017-03-24 NOTE — Telephone Encounter (Signed)
Pt called in to follow up on refill request.   Please assist further.

## 2017-03-28 ENCOUNTER — Telehealth: Payer: Self-pay | Admitting: Family Medicine

## 2017-03-28 NOTE — Telephone Encounter (Signed)
Pt called checking status Tussinex refill.

## 2017-03-28 NOTE — Telephone Encounter (Signed)
Pt request TUSSINEX cough syrup called in to [facility]. Please call pt to confirm.

## 2017-03-29 NOTE — Telephone Encounter (Signed)
Patient scheduled cough appointment for 03/31/17 with PCP.

## 2017-03-30 ENCOUNTER — Other Ambulatory Visit: Payer: Self-pay

## 2017-03-30 MED ORDER — HYDROCOD POLST-CPM POLST ER 10-8 MG/5ML PO SUER: 5.0000 mL | Freq: Two times a day (BID) | ORAL | 0 refills | Status: DC | PRN

## 2017-03-31 ENCOUNTER — Ambulatory Visit (INDEPENDENT_AMBULATORY_CARE_PROVIDER_SITE_OTHER): Payer: Medicare Other | Admitting: Family Medicine

## 2017-03-31 ENCOUNTER — Other Ambulatory Visit (INDEPENDENT_AMBULATORY_CARE_PROVIDER_SITE_OTHER): Payer: Medicare Other

## 2017-03-31 ENCOUNTER — Encounter: Payer: Self-pay | Admitting: Family Medicine

## 2017-03-31 VITALS — BP 138/78 | Temp 97.8°F | Ht 66.0 in | Wt 185.0 lb

## 2017-03-31 DIAGNOSIS — E785 Hyperlipidemia, unspecified: Secondary | ICD-10-CM

## 2017-03-31 DIAGNOSIS — J40 Bronchitis, not specified as acute or chronic: Secondary | ICD-10-CM

## 2017-03-31 LAB — COMPLETE METABOLIC PANEL WITH GFR
AG Ratio: 1.7 (calc) (ref 1.0–2.5)
ALBUMIN MSPROF: 4.1 g/dL (ref 3.6–5.1)
ALKALINE PHOSPHATASE (APISO): 83 U/L (ref 33–130)
ALT: 11 U/L (ref 6–29)
AST: 18 U/L (ref 10–35)
BUN / CREAT RATIO: 13 (calc) (ref 6–22)
BUN: 15 mg/dL (ref 7–25)
CO2: 30 mmol/L (ref 20–32)
CREATININE: 1.17 mg/dL — AB (ref 0.60–0.88)
Calcium: 9.3 mg/dL (ref 8.6–10.4)
Chloride: 101 mmol/L (ref 98–110)
GFR, EST AFRICAN AMERICAN: 49 mL/min/{1.73_m2} — AB (ref >=60)
GFR, Est Non African American: 42 mL/min/{1.73_m2} — ABNORMAL LOW (ref >=60)
GLOBULIN: 2.4 g/dL (ref 1.9–3.7)
Glucose, Bld: 102 mg/dL — ABNORMAL HIGH (ref 65–99)
Potassium: 4.7 mmol/L (ref 3.5–5.3)
SODIUM: 138 mmol/L (ref 135–146)
TOTAL PROTEIN: 6.5 g/dL (ref 6.1–8.1)
Total Bilirubin: 0.8 mg/dL (ref 0.2–1.2)

## 2017-03-31 LAB — LIPID PANEL
Cholesterol: 226 mg/dL — ABNORMAL HIGH (ref 0–200)
HDL: 47.1 mg/dL (ref >39.00)
LDL CALC: 147 mg/dL — AB (ref 0–99)
NonHDL: 178.66
TRIGLYCERIDES: 156 mg/dL — AB (ref 0.0–149.0)
Total CHOL/HDL Ratio: 5
VLDL: 31.2 mg/dL (ref 0.0–40.0)

## 2017-03-31 MED ORDER — AZITHROMYCIN 250 MG PO TABS: ORAL_TABLET | ORAL | 0 refills | Status: DC

## 2017-03-31 MED FILL — HYDROCODONE-CHLORPHENIRAM S: 10-8 | 14 days supply | Qty: 140 | Fill #0

## 2017-03-31 NOTE — Assessment & Plan Note (Signed)
z pack Pt has tussionex at home mucinex in day Call or rto prn

## 2017-03-31 NOTE — Progress Notes (Signed)
Patient ID: Deanna Shields, female    DOB: Apr 07, 1929  Age: 83 y.o. MRN: [id number]    Subjective:  Subjective  HPI [place] D Hudlow presents for cough and congestion since last Saturday.  No fever/ chills.    Review of Systems  Constitutional: Negative for chills and fever.  HENT: Positive for congestion, postnasal drip, rhinorrhea and sore throat. Negative for sinus pressure.   Respiratory: Positive for cough, chest tightness and shortness of breath. Negative for wheezing.   Cardiovascular: Negative for chest pain, palpitations and leg swelling.  Allergic/Immunologic: Negative for environmental allergies.    History Past Medical History:  Diagnosis Date  . Arthritis    "right thumb" (01/29/2015)  . Chronic shoulder pain    "between my shoulders" (01/29/2015)  . Dizziness   . Esophageal stricture 2009  . Gallstones   . GERD (gastroesophageal reflux disease) 2004  . Hemorrhoids 2004  . Hiatal hernia 2004  . History of blood transfusion 1947   "appendix ruptured"  . HOH (hard of hearing)   . Hypertension   . Rhinitis, allergic   . Syncopal episodes     She has a past surgical history that includes Vaginal hysterectomy (1970's); Hemorroidectomy; Knee arthroscopy (Bilateral); Carpal tunnel release (Right, 1990's); NM MYOVIEW LTD (06/01/2012); left heart catheterization with coronary angiogram (N/A, 09/16/2014); Appendectomy (1947); Laparoscopic cholecystectomy (01/29/2015); Dilation and curettage of uterus ("couple"); Cardiac catheterization (10/23/1999; 09/2014); Esophagogastroduodenoscopy (egd) with esophageal dilation (2-3 times); and Cholecystectomy (N/A, 01/29/2015).   Her family history includes Heart disease in her sister; Stroke (age of onset: 60) in her father.She reports that she has never smoked. She has never used smokeless tobacco. She reports that she does not drink alcohol or use drugs.  Current Outpatient Prescriptions on File Prior to Visit  Medication Sig Dispense  Refill  . ALPRAZolam (XANAX) 0.25 MG tablet Take 0.25 mg by mouth daily as needed for anxiety.    [name] amLODipine (NORVASC) 2.5 MG tablet Take 1 tablet (2.5 mg total) by mouth daily. 90 tablet 3  . atorvastatin (LIPITOR) 20 MG tablet Take 1 tablet (20 mg total) by mouth at bedtime. 30 tablet 2  . Calcium Citrate (CITRACAL PO) Take 1 tablet by mouth daily.    . chlorpheniramine-HYDROcodone (TUSSIONEX PENNKINETIC ER) 10-8 MG/5ML SUER Take 5 mLs by mouth every 12 (twelve) hours as needed for cough. 140 mL 0  . losartan (COZAAR) 50 MG tablet Take 1 tablet (50 mg total) by mouth daily. 90 tablet 1  . methocarbamol (ROBAXIN) 500 MG tablet Take 1 tablet (500 mg total) by mouth 4 (four) times daily as needed for muscle spasms. 40 tablet 0  . omeprazole (PRILOSEC) 40 MG capsule Take 1 capsule (40 mg total) by mouth daily. 90 capsule 1  . traZODone (DESYREL) 50 MG tablet Take 0.5-1 tablets (25-50 mg total) by mouth at bedtime as needed for sleep. 30 tablet 2   No current facility-administered medications on file prior to visit.      Objective:  Objective  Physical Exam  Constitutional: She is oriented to person, place, and time. She appears well-developed and well-nourished.  HENT:  Right Ear: External ear normal.  Left Ear: External ear normal.  + PND + errythema  Eyes: Conjunctivae are normal. Right eye exhibits no discharge. Left eye exhibits no discharge.  Cardiovascular: Normal rate, regular rhythm and normal heart sounds.   No murmur heard. Pulmonary/Chest: Effort normal. No respiratory distress. She has no wheezes. She has rhonchi. She has no rales. She  exhibits no tenderness.  Musculoskeletal: She exhibits no edema.  Lymphadenopathy:    She has cervical adenopathy.  Neurological: She is alert and oriented to person, place, and time.  Nursing note and vitals reviewed.  BP 138/78   Temp 97.8 F (36.6 C) (Oral)   Ht 5\' 6"  (1.676 m)   Wt 185 lb (83.9 kg)   BMI 29.86 kg/m  Wt Readings  from Last 3 Encounters:  03/31/17 185 lb (83.9 kg)  02/15/17 178 lb 12.8 oz (81.1 kg)  11/21/16 175 lb (79.4 kg)     Lab Results  Component Value Date   WBC 10.2 02/15/2017   HGB 13.5 02/15/2017   HCT 41.4 02/15/2017   PLT 248.0 02/15/2017   GLUCOSE 94 02/15/2017   CHOL 210 (H) 12/28/2016   TRIG 111.0 12/28/2016   HDL 48.70 12/28/2016   LDLDIRECT 161.2 07/18/2013   LDLCALC 139 (H) 12/28/2016   ALT 14 02/15/2017   AST 19 02/15/2017   NA 139 02/15/2017   K 4.9 02/15/2017   CL 101 02/15/2017   CREATININE 1.03 02/15/2017   BUN 21 02/15/2017   CO2 29 02/15/2017   TSH 2.780 10/28/2013   INR 0.99 09/09/2014    No results found.   Assessment & Plan:  Plan  I am having Ms. Reith start on azithromycin. I am also having her maintain her Calcium Citrate (CITRACAL PO), amLODipine, losartan, ALPRAZolam, traZODone, atorvastatin, omeprazole, methocarbamol, and chlorpheniramine-HYDROcodone.  Meds ordered this encounter  Medications  . azithromycin (ZITHROMAX Z-PAK) 250 MG tablet    Sig: As directed    Dispense:  6 each    Refill:  0    Problem List Items Addressed This Visit      Unprioritized   Bronchitis - Primary    z pack Pt has tussionex at home mucinex in day Call or rto prn      Relevant Medications   azithromycin (ZITHROMAX Z-PAK) 250 MG tablet      Follow-up: Return if symptoms worsen or fail to improve.  [name], DO

## 2017-03-31 NOTE — Addendum Note (Signed)
Addended by: Caffie Pinto on: 03/31/2017 07:26 AM   Modules accepted: Orders

## 2017-03-31 NOTE — Patient Instructions (Signed)

## 2017-04-05 ENCOUNTER — Telehealth: Payer: Self-pay | Admitting: Family Medicine

## 2017-04-05 NOTE — Telephone Encounter (Signed)
Left message on machine to call back  

## 2017-04-05 NOTE — Telephone Encounter (Signed)
Pt returned call from yesterday for lab results.

## 2017-04-06 ENCOUNTER — Telehealth: Payer: Self-pay | Admitting: Family Medicine

## 2017-04-06 NOTE — Telephone Encounter (Signed)
Called to check in pn Pt to see how she's doing and inform her of lab results. Pt was not able to answer, left a VM for Pt call back whenever she gets a moment .

## 2017-04-06 NOTE — Telephone Encounter (Signed)
Patient called and stts she was returning call, Patient requested if she could recd a call back before 3pm today

## 2017-04-06 NOTE — Telephone Encounter (Signed)
-----   Message from [name], DO sent at 04/03/2017  9:08 AM EDT ----- Cholesterol--- LDL goal < 100,  HDL >40,  TG < 150.  Diet and exercise will increase HDL and decrease LDL and TG.  Fish,  Fish Oil, Flaxseed oil will also help increase the HDL and decrease Triglycerides.   Recheck labs in 3 months---  lipitor 40 mg  #30  1 po qd, 2 refills . Glucose elevated-- watch simple sugars and starches Cr=kidney function --- elevated----  We will repeat this in 3 months as well and if it con't to inc we will need to refer you to a nephrologist Cmp, lipid, hgba1c

## 2017-04-07 ENCOUNTER — Other Ambulatory Visit: Payer: Self-pay | Admitting: *Deleted

## 2017-04-07 ENCOUNTER — Telehealth: Payer: Self-pay | Admitting: *Deleted

## 2017-04-07 DIAGNOSIS — R739 Hyperglycemia, unspecified: Secondary | ICD-10-CM

## 2017-04-07 DIAGNOSIS — I10 Essential (primary) hypertension: Secondary | ICD-10-CM

## 2017-04-07 MED ORDER — ATORVASTATIN CALCIUM 40 MG PO TABS: 40.0000 mg | ORAL_TABLET | Freq: Every day | ORAL | 2 refills | Status: DC

## 2017-04-07 MED FILL — ATORVASTATIN 40 MG TABLET: 40 | 30 days supply | Qty: 30 | Fill #0

## 2017-04-07 NOTE — Telephone Encounter (Signed)
No answer/no vm.  See lab notes

## 2017-04-07 NOTE — Telephone Encounter (Signed)
Pt has called several times and phone disconnects and she calls back then phone disconnects again. Pt is trying to make contact for lab results.

## 2017-04-07 NOTE — Telephone Encounter (Signed)
Pt is available at [facility] ph [phone] for someone to return her call.

## 2017-04-08 NOTE — Telephone Encounter (Signed)
See labs.  Patient notified

## 2017-04-22 NOTE — Telephone Encounter (Addendum)
°  Relation to KF:MMCR Call back number:[phone] Pharmacy: [facility] [phone] (Phone) [phone] (Fax)     Reason for call:  Patient states she's unsure if PCP prescribed generic of atorvastatin (LIPITOR) 40 MG tablet but medication is causing her feet and legs to swell, please advise

## 2017-04-22 NOTE — Telephone Encounter (Signed)
Change to crestor 20 mg #30  2 refills and recheck cmp, lipid in 2 months

## 2017-04-26 ENCOUNTER — Other Ambulatory Visit: Payer: Self-pay | Admitting: *Deleted

## 2017-04-26 MED ORDER — ROSUVASTATIN CALCIUM 20 MG PO TABS: 20.0000 mg | ORAL_TABLET | Freq: Every day | ORAL | 2 refills | Status: DC

## 2017-04-26 NOTE — Addendum Note (Signed)
Addended by: Kem Boroughs D on: 04/26/2017 08:16 AM   Modules accepted: Orders

## 2017-04-26 NOTE — Telephone Encounter (Signed)
Patient notified and rx sent in and pt has an appt already on 07/08/17 for blood work

## 2017-04-26 NOTE — Progress Notes (Signed)
error 

## 2017-05-19 ENCOUNTER — Emergency Department (HOSPITAL_COMMUNITY): Payer: Medicare Other

## 2017-05-19 ENCOUNTER — Other Ambulatory Visit: Payer: Self-pay

## 2017-05-19 ENCOUNTER — Ambulatory Visit: Payer: Self-pay | Admitting: *Deleted

## 2017-05-19 ENCOUNTER — Encounter (HOSPITAL_COMMUNITY): Payer: Self-pay

## 2017-05-19 ENCOUNTER — Emergency Department (HOSPITAL_COMMUNITY)
Admission: EM | Admit: 2017-05-19 | Discharge: 2017-05-19 | Disposition: A | Discharge status: Home / Self Care | Payer: Medicare Other | Attending: Emergency Medicine | Admitting: Emergency Medicine

## 2017-05-19 DIAGNOSIS — R42 Dizziness and giddiness: Secondary | ICD-10-CM | POA: Insufficient documentation

## 2017-05-19 DIAGNOSIS — R008 Other abnormalities of heart beat: Secondary | ICD-10-CM | POA: Insufficient documentation

## 2017-05-19 DIAGNOSIS — R404 Transient alteration of awareness: Secondary | ICD-10-CM | POA: Diagnosis not present

## 2017-05-19 DIAGNOSIS — I10 Essential (primary) hypertension: Secondary | ICD-10-CM | POA: Diagnosis not present

## 2017-05-19 DIAGNOSIS — Z79899 Other long term (current) drug therapy: Secondary | ICD-10-CM | POA: Diagnosis not present

## 2017-05-19 DIAGNOSIS — I499 Cardiac arrhythmia, unspecified: Secondary | ICD-10-CM

## 2017-05-19 DIAGNOSIS — I498 Other specified cardiac arrhythmias: Secondary | ICD-10-CM

## 2017-05-19 LAB — URINALYSIS, ROUTINE W REFLEX MICROSCOPIC
Bilirubin Urine: NEGATIVE
Glucose, UA: NEGATIVE mg/dL
Hgb urine dipstick: NEGATIVE
KETONES UR: NEGATIVE mg/dL
LEUKOCYTES UA: NEGATIVE
NITRITE: NEGATIVE
PH: 7 (ref 5.0–8.0)
PROTEIN: NEGATIVE mg/dL
Specific Gravity, Urine: 1.004 — ABNORMAL LOW (ref 1.005–1.030)

## 2017-05-19 LAB — CBC WITH DIFFERENTIAL/PLATELET
BASOS PCT: 1 %
Basophils Absolute: 0 10*3/uL (ref 0.0–0.1)
EOS ABS: 0.2 10*3/uL (ref 0.0–0.7)
Eosinophils Relative: 2 %
HCT: 40.5 % (ref 36.0–46.0)
Hemoglobin: 12.9 g/dL (ref 12.0–15.0)
Lymphocytes Relative: 29 %
Lymphs Abs: 2.1 10*3/uL (ref 0.7–4.0)
MCH: 28.8 pg (ref 26.0–34.0)
MCHC: 31.9 g/dL (ref 30.0–36.0)
MCV: 90.4 fL (ref 78.0–100.0)
MONOS PCT: 7 %
Monocytes Absolute: 0.5 10*3/uL (ref 0.1–1.0)
NEUTROS PCT: 61 %
Neutro Abs: 4.6 10*3/uL (ref 1.7–7.7)
Platelets: 269 10*3/uL (ref 150–400)
RBC: 4.48 MIL/uL (ref 3.87–5.11)
RDW: 14.6 % (ref 11.5–15.5)
WBC: 7.4 10*3/uL (ref 4.0–10.5)

## 2017-05-19 LAB — BASIC METABOLIC PANEL
Anion gap: 8 (ref 5–15)
BUN: 17 mg/dL (ref 6–20)
CALCIUM: 9.3 mg/dL (ref 8.9–10.3)
CO2: 25 mmol/L (ref 22–32)
CREATININE: 1.14 mg/dL — AB (ref 0.44–1.00)
Chloride: 106 mmol/L (ref 101–111)
GFR, EST AFRICAN AMERICAN: 49 mL/min — AB (ref >60)
GFR, EST NON AFRICAN AMERICAN: 42 mL/min — AB (ref >60)
Glucose, Bld: 124 mg/dL — ABNORMAL HIGH (ref 65–99)
Potassium: 4.2 mmol/L (ref 3.5–5.1)
SODIUM: 139 mmol/L (ref 135–145)

## 2017-05-19 LAB — HEPATIC FUNCTION PANEL
ALT: 14 U/L (ref 14–54)
AST: 29 U/L (ref 15–41)
Albumin: 3.8 g/dL (ref 3.5–5.0)
Alkaline Phosphatase: 76 U/L (ref 38–126)
Total Bilirubin: 0.6 mg/dL (ref 0.3–1.2)
Total Protein: 6.2 g/dL — ABNORMAL LOW (ref 6.5–8.1)

## 2017-05-19 LAB — I-STAT TROPONIN, ED: TROPONIN I, POC: 0 ng/mL (ref 0.00–0.08)

## 2017-05-19 MED ORDER — SODIUM CHLORIDE 0.9 % IV BOLUS (SEPSIS)
500.0000 mL | Freq: Once | INTRAVENOUS | Status: AC
  Administered 2017-05-19: 500 mL via INTRAVENOUS

## 2017-05-19 MED ORDER — ONDANSETRON HCL 4 MG/2ML IJ SOLN
4.0000 mg | Freq: Once | INTRAMUSCULAR | Status: AC
  Administered 2017-05-19: 4 mg via INTRAVENOUS
  Filled 2017-05-19: qty 2

## 2017-05-19 NOTE — ED Provider Notes (Addendum)
[facility] EMERGENCY DEPARTMENT Provider Note   CSN: [id number] Arrival date & time: 05/19/17  1407     History   Chief Complaint Chief Complaint  Patient presents with  . Dizziness    In Bigeminy     HPI [place] is a 84 y.o. female.  HPI  83 year old female with history of hypertension presents with concern for dizziness.  Patient reports a feeling of fatigue, not feeling herself, feeling dizzy for the last 3 days.  Reports that the and sensation is somewhat constant, however waxes and wanes with sometimes being more severe than others.  Reports at this time, she feels pretty good.  Reports earlier today, she felt more dizzy.  Reports that they had checked her heart rate and found it to be 46.  EMS had noted that she was in bigeminy prior to arrival.  She reports she is otherwise been in a good state of health.  Denies fevers, cough, urinary symptoms, abdominal pain, headache, chest pain, shortness of breath, black or bloody stools, diarrhea.  Reports that overall, her appetite has been okay.  Denies any other symptoms other than feeling fatigued.  Denies numbness, weakness, difficulty talking or walking.  Has had dizziness before for which she seen her primary care physician, as well as presented to the emergency department.  She sees Dr. Alvester Chou of cardiology, who helps in managing her hypertension.  Past Medical History:  Diagnosis Date  . Arthritis    "right thumb" (01/29/2015)  . Chronic shoulder pain    "between my shoulders" (01/29/2015)  . Dizziness   . Esophageal stricture 2009  . Gallstones   . GERD (gastroesophageal reflux disease) 2004  . Hemorrhoids 2004  . Hiatal hernia 2004  . History of blood transfusion 1947   "appendix ruptured"  . HOH (hard of hearing)   . Hypertension   . Rhinitis, allergic   . Syncopal episodes     Patient Active Problem List   Diagnosis Date Noted  . Bronchitis 03/31/2017  . Transaminasemia 06/02/2016    . Choledocholithiasis   . Right middle lobe pneumonia ([place]) 05/31/2016  . CAP (community acquired pneumonia) 05/31/2016  . Nausea vomiting and diarrhea 05/31/2016  . S/P laparoscopic cholecystectomy 01/29/2015  . Pain in the chest   . Abnormal nuclear stress test   . Allergic rhinitis 07/12/2014  . Viral stomatitis 07/05/2014  . Syncope 11/10/2013  . Hyponatremia 11/10/2013  . Chest pain 10/28/2013  . Dizziness 10/19/2013  . Near syncope 10/19/2013  . Leg cramps 12/25/2012  . Bilateral claudication of lower limb ([place]) 12/25/2012  . Stress reaction 09/15/2012  . Hearing loss of right ear 07/31/2012  . Retinal tear 11/03/2011  . ABDOMINAL PAIN, RIGHT UPPER QUADRANT 06/18/2010  . TINEA CRURIS 10/29/2009  . Cerumen impaction 07/28/2009  . NECK PAIN, RIGHT 07/28/2009  . DIZZINESS 07/28/2009  . UNSPECIFIED VITAMIN D DEFICIENCY 06/16/2009  . B12 DEFICIENCY 04/01/2009  . Shortness of breath 02/03/2009  . DYSURIA 06/04/2008  . INTERNAL HEMORRHOIDS 04/22/2008  . HIATAL HERNIA 04/22/2008  . GERD 03/21/2008  . Allergic rhinitis, cause unspecified 08/10/2007  . OSTEOPENIA 03/22/2007  . BACKACHE NOS 02/23/2007  . COUGH, CHRONIC 02/23/2007  . Essential hypertension 11/17/2006  . DEPENDENT EDEMA 11/17/2006    Past Surgical History:  Procedure Laterality Date  . APPENDECTOMY  1947   "ruptured"  . CARDIAC CATHETERIZATION  10/23/1999; 09/2014   EF 60% No significant CAD; patent coronary arteries.  . CARPAL TUNNEL RELEASE Right 1990's  .  CHOLECYSTECTOMY N/A 01/29/2015   Procedure: LAPAROSCOPIC CHOLECYSTECTOMY;  Surgeon: Rolm Bookbinder, MD;  Location: [facility];  Service: General;  Laterality: N/A;  . DILATION AND CURETTAGE OF UTERUS  "couple"  . ESOPHAGOGASTRODUODENOSCOPY (EGD) WITH ESOPHAGEAL DILATION  2-3 times  . HEMORROIDECTOMY    . KNEE ARTHROSCOPY Bilateral    right x 2  . LAPAROSCOPIC CHOLECYSTECTOMY  01/29/2015  . LEFT HEART CATHETERIZATION WITH CORONARY ANGIOGRAM N/A 09/16/2014    Procedure: LEFT HEART CATHETERIZATION WITH CORONARY ANGIOGRAM;  Surgeon: [name], MD;  Location: [facility] CATH LAB;  Service: Cardiovascular;  Laterality: N/A;  . NM MYOVIEW LTD  06/01/2012   EF 71%  . VAGINAL HYSTERECTOMY  1970's    OB History    No data available       Home Medications    Prior to Admission medications   Medication Sig Start Date End Date Taking? Authorizing Provider  ALPRAZolam Duanne Moron) 0.25 MG tablet Take 0.25 mg by mouth daily as needed for anxiety.   Yes [provider]  amLODipine (NORVASC) 2.5 MG tablet Take 1 tablet (2.5 mg total) by mouth daily. 07/23/16  Yes [name], MD  Calcium Citrate (CITRACAL PO) Take 1 tablet by mouth daily.   Yes [provider]  chlorpheniramine-HYDROcodone (TUSSIONEX PENNKINETIC ER) 10-8 MG/5ML SUER Take 5 mLs by mouth every 12 (twelve) hours as needed for cough. 03/30/17  Yes Roma Schanz R, DO  losartan (COZAAR) 50 MG tablet Take 1 tablet (50 mg total) by mouth daily. 07/27/16  Yes Roma Schanz R, DO  methocarbamol (ROBAXIN) 500 MG tablet Take 1 tablet (500 mg total) by mouth 4 (four) times daily as needed for muscle spasms. 03/01/17  Yes Roma Schanz R, DO  omeprazole (PRILOSEC) 40 MG capsule Take 1 capsule (40 mg total) by mouth daily. 02/14/17  Yes Roma Schanz R, DO  rosuvastatin (CRESTOR) 20 MG tablet Take 1 tablet (20 mg total) daily by mouth. 04/26/17  Yes Lowne Chase, Alferd Apa, DO  traZODone (DESYREL) 50 MG tablet Take 0.5-1 tablets (25-50 mg total) by mouth at bedtime as needed for sleep. 11/25/16  Yes Roma Schanz R, DO  atorvastatin (LIPITOR) 40 MG tablet Take 1 tablet (40 mg total) by mouth daily. Patient not taking: Reported on 05/19/2017 04/07/17   Carollee Herter, Alferd Apa, DO  azithromycin (ZITHROMAX Z-PAK) 250 MG tablet As directed Patient not taking: Reported on 05/19/2017 03/31/17   [name], DO    Family History Family History  Problem  Relation Age of Onset  . Stroke Father 104       light stroke  . Heart disease Sister   . Coronary artery disease Neg Hx   . Diabetes Neg Hx   . Cancer Neg Hx        breast, colon, prostate    Social History Social History   Tobacco Use  . Smoking status: Never Smoker  . Smokeless tobacco: Never Used  Substance Use Topics  . Alcohol use: No    Alcohol/week: 0.0 oz  . Drug use: No     Allergies   Contrast media [iodinated diagnostic agents] and Codeine   Review of Systems Review of Systems  Constitutional: Positive for fatigue. Negative for appetite change and fever.  HENT: Negative for congestion and sore throat.   Eyes: Negative for visual disturbance.  Respiratory: Negative for cough and shortness of breath.   Cardiovascular: Negative for chest pain.  Gastrointestinal: Negative for abdominal pain,  blood in stool, diarrhea, nausea and vomiting.  Genitourinary: Negative for difficulty urinating and dysuria.  Musculoskeletal: Negative for back pain and neck pain.  Skin: Negative for rash.  Neurological: Positive for light-headedness. Negative for syncope, facial asymmetry, speech difficulty, weakness, numbness and headaches.     Physical Exam Updated Vital Signs BP (!) 144/65   Pulse 77   Temp 97.6 F (36.4 C) (Oral)   Resp 14   SpO2 97%   Physical Exam  Constitutional: She is oriented to person, place, and time. She appears well-developed and well-nourished. No distress.  HENT:  Head: Normocephalic and atraumatic.  Eyes: Conjunctivae and EOM are normal.  Neck: Normal range of motion.  Cardiovascular: Normal rate, regular rhythm, normal heart sounds and intact distal pulses. Exam reveals no gallop and no friction rub.  No murmur heard. Pulmonary/Chest: Effort normal and breath sounds normal. No respiratory distress. She has no wheezes. She has no rales.  Abdominal: Soft. She exhibits no distension. There is no tenderness. There is no guarding.    Musculoskeletal: She exhibits no edema or tenderness.  Neurological: She is alert and oriented to person, place, and time. She has normal strength. No cranial nerve deficit or sensory deficit. Coordination normal. GCS eye subscore is 4. GCS verbal subscore is 5. GCS motor subscore is 6.  Skin: Skin is warm and dry. No rash noted. She is not diaphoretic. No erythema.  Nursing note and vitals reviewed.    ED Treatments / Results  Labs (all labs ordered are listed, but only abnormal results are displayed) Labs Reviewed  BASIC METABOLIC PANEL - Abnormal; Notable for the following components:      Result Value   Glucose, Bld 124 (*)    Creatinine, Ser 1.14 (*)    GFR calc non Af Amer 42 (*)    GFR calc Af Amer 49 (*)    All other components within normal limits  HEPATIC FUNCTION PANEL - Abnormal; Notable for the following components:   Total Protein 6.2 (*)    Bilirubin, Direct <0.1 (*)    All other components within normal limits  URINALYSIS, ROUTINE W REFLEX MICROSCOPIC - Abnormal; Notable for the following components:   Color, Urine STRAW (*)    Specific Gravity, Urine 1.004 (*)    All other components within normal limits  CBC WITH DIFFERENTIAL/PLATELET  I-STAT TROPONIN, ED    EKG  EKG Interpretation  Date/Time:  Thursday May 19 2017 14:27:54 EST Ventricular Rate:  80 PR Interval:    QRS Duration: 100 QT Interval:  396 QTC Calculation: 457 R Axis:   -27 Text Interpretation:  Sinus rhythm Borderline left axis deviation Low voltage, precordial leads Baseline wander in lead(s) II III aVF No significant change since last tracing Confirmed by Gareth Morgan [phone]) on 05/19/2017 3:07:13 PM       Radiology Dg Chest 2 View  Result Date: 05/19/2017 CLINICAL DATA:  Dizziness for 2 days EXAM: CHEST  2 VIEW COMPARISON:  10/31/2015 FINDINGS: The heart size and mediastinal contours are within normal limits. Both lungs are clear. The visualized skeletal structures are  unremarkable. IMPRESSION: No active cardiopulmonary disease. Electronically Signed   By: Inez Catalina M.D.   On: 05/19/2017 15:05    Procedures Procedures (including critical care time)  Medications Ordered in ED Medications  sodium chloride 0.9 % bolus 500 mL (0 mLs Intravenous Stopped 05/19/17 1845)  ondansetron (ZOFRAN) injection 4 mg (4 mg Intravenous Given 05/19/17 1728)     Initial Impression /  Assessment and Plan / ED Course  I have reviewed the triage vital signs and the nursing notes.  Pertinent labs & imaging results that were available during my care of the patient were reviewed by me and considered in my medical decision making (see chart for details).     83 year old female with history of hypertension presents with concern for dizziness.  Patient was noted to be in bigeminy by EMS, with pulse oximeter reading a rate of 46 at time that patient was more symptomatic with dizziness.  Denies numbness, weakness, reports normal gait, has normal neurologic exam including normal coordination, and symptoms more consistent with lightheadedness, doubt CVA. Labs obtained, showed no evidence of anemia, no significant electrolyte abnormalities, no evidence of cardiac ischemia.  Urinalysis shows no sign of infection.  Discussed with Dr. Meda Coffee of Cardiology given bigeminy during which patient sounds to be symptomatic.  She has remained hemodynamically stable, at time of my evaluation, is in normal sinus rhythm and feeling improved. Does report some dizziness in nausea which responded to 500cc NS and zofran.  Dr. Meda Coffee will have patient follow up tomorrow in clinic.    Final Clinical Impressions(s) / ED Diagnoses   Final diagnoses:  Lightheadedness  Ventricular bigeminy    ED Discharge Orders    None          Gareth Morgan, MD 05/20/17 [phone]

## 2017-05-19 NOTE — Telephone Encounter (Signed)
Pt  Reports   Dizzy    And  The nurse    At her  Assisted  Living    Checked  Her  Vital  Signs  And   The   bp  Was   168/72   Pulse   Was  47     She  Is  Awake  And   Alert  And  Oriented  .  Denies  Any chest pain  Or  Shortness  Of  Breath .  She  States  The nurse  At the facility was   Concerned  About  Her   Heart  Rate .  Ems   Called    Via  911     Stayed  On  Phone  Line   With  Patient      Reason for Disposition . Heart beating very slowly (e.g., < 50 / minute)  (Exception: athlete)  Answer Assessment - Initial Assessment Questions 1. DESCRIPTION: "Describe your dizziness."     Lightheaded    2. LIGHTHEADED: "Do you feel lightheaded?" (e.g., somewhat faint, woozy, weak upon standing)        No 3. VERTIGO: "Do you feel like either you or the room is spinning or tilting?" (i.e. vertigo)      No 4. SEVERITY: "How bad is it?"  "Do you feel like you are going to faint?" "Can you stand and walk?"   - MILD - walking normally   - MODERATE - interferes with normal activities (e.g., work, school)    - SEVERE - unable to stand, requires support to walk, feels like passing out now.       mod 5. ONSET:  "When did the dizziness begin?"     Yesterday 6. AGGRAVATING FACTORS: "Does anything make it worse?" (e.g., standing, change in head position)      no 7. HEART RATE: "Can you tell me your heart rate?" "How many beats in 15 seconds?"  (Note: not all patients can do this)       46 8. CAUSE: "What do you think is causing the dizziness?"      NO 9. RECURRENT SYMPTOM: "Have you had dizziness before?" If so, ask: "When was the last time?" "What happened that time?"     NOT  RECEINTLY 10. OTHER SYMPTOMS: "Do you have any other symptoms?" (e.g., fever, chest pain, vomiting, diarrhea, bleeding)      no 11. PREGNANCY: "Is there any chance you are pregnant?" "When was your last menstrual period?"       N/a  Answer Assessment - Initial Assessment Questions 1. DESCRIPTION: "Please describe your  heart rate or heart beat that you are having" (e.g., fast/slow, regular/irregular, skipped or extra beats, "palpitations")     no 2. ONSET: "When did it start?" (Minutes, hours or days)      no 3. DURATION: "How long does it last" (e.g., seconds, minutes, hours)     no 4. PATTERN "Does it come and go, or has it been constant since it started?"  "Does it get worse with exertion?"   "Are you feeling it now?"     no 5. TAP: "Using your hand, can you tap out what you are feeling on a chair or table in front of you, so that I can hear?" (Note: not all patients can do this)       *n/a 6. HEART RATE: "Can you tell me your heart rate?" "How many beats in 15 seconds?"  (  Note: not all patients can do this)       46 7. RECURRENT SYMPTOM: "Have you ever had this before?" If so, ask: "When was the last time?" and "What happened that time?"        No 8. CAUSE: "What do you think is causing the palpitations?"         n/a 9. CARDIAC HISTORY: "Do you have any history of heart disease?" (e.g., heart attack, angina, bypass surgery, angioplasty, arrhythmia)           no 10. OTHER SYMPTOMS: "Do you have any other symptoms?" (e.g., dizziness, chest pain, sweating, difficulty breathing)       dizzy 11. PREGNANCY: "Is there any chance you are pregnant?" "When was your last menstrual period?"        none  Protocols used: HEART RATE AND HEARTBEAT Carilyn Goodpasture, [place]

## 2017-05-19 NOTE — ED Notes (Signed)
Dr. Schlossman at bedside. 

## 2017-05-19 NOTE — ED Triage Notes (Signed)
Per [facility]: Pt has been having dizziness since yesterday, went and talked to facility. Was told heart rate was 46 this AM and was told to call her doctor. Was in bigeminy on EMS arrival. Pt has been in and out of it, now in sinus. This has never happened before. Only complaint is dizziness. Pt is alert and oriented, no SOB, No chest pain, no other complaints.

## 2017-05-19 NOTE — ED Notes (Signed)
Pt taken to bathroom via wheelchair to void, gait steady, reports "a little" dizziness but tolerated well.

## 2017-05-19 NOTE — ED Notes (Signed)
Pt ambulating to restroom without difficulty.   

## 2017-05-19 NOTE — Telephone Encounter (Signed)
Pt is in the ER  °

## 2017-05-26 ENCOUNTER — Ambulatory Visit (INDEPENDENT_AMBULATORY_CARE_PROVIDER_SITE_OTHER): Payer: Medicare Other | Admitting: Cardiology

## 2017-05-26 ENCOUNTER — Encounter: Payer: Self-pay | Admitting: Cardiology

## 2017-05-26 VITALS — BP 147/65 | HR 57 | Ht 66.0 in | Wt 188.0 lb

## 2017-05-26 DIAGNOSIS — Z0389 Encounter for observation for other suspected diseases and conditions ruled out: Secondary | ICD-10-CM

## 2017-05-26 DIAGNOSIS — Z87898 Personal history of other specified conditions: Secondary | ICD-10-CM

## 2017-05-26 DIAGNOSIS — R002 Palpitations: Secondary | ICD-10-CM | POA: Diagnosis not present

## 2017-05-26 DIAGNOSIS — E785 Hyperlipidemia, unspecified: Secondary | ICD-10-CM

## 2017-05-26 DIAGNOSIS — I10 Essential (primary) hypertension: Secondary | ICD-10-CM | POA: Diagnosis not present

## 2017-05-26 DIAGNOSIS — R0602 Shortness of breath: Secondary | ICD-10-CM

## 2017-05-26 DIAGNOSIS — I493 Ventricular premature depolarization: Secondary | ICD-10-CM

## 2017-05-26 DIAGNOSIS — IMO0001 Reserved for inherently not codable concepts without codable children: Secondary | ICD-10-CM

## 2017-05-26 NOTE — Assessment & Plan Note (Signed)
She admits to occasional plantations, no sustained tachycardia.

## 2017-05-26 NOTE — Progress Notes (Signed)
05/26/2017 Deanna Shields   Oct 30, 1928  [id number]  Primary Physician [name], DO Primary Cardiologist: Dr [name]  HPI:  Pleasant 83 y/o female followed b Dr [name] with a history of past chest pain, s/p cath in 2001 and in 2016 showing no significant CAD. Echo in 2016 showed normal LVF. She has treated HTN and HDL followed by her PCP. She was seen in the ED 05/19/17 after an RN at [facility] told the pt her HR was slow on a routine check. The pt tells me she was unaware of any symptoms at the time. She was seen in the ED and apparently bigeminy was documented. This was reviewed with Dr Meda Coffee who felt the pt could be discharged and f/u in the office. She is here today with her nephew. She has had chronic DOE, no orthopnea. She has noted occasional palpitations, no sustained tachycardia. She does admit to a history of syncope > a year ago, felt to be orthostatic. In the office her EKG shows PVCs and PACs.    Current Outpatient Medications  Medication Sig Dispense Refill  . ALPRAZolam (XANAX) 0.25 MG tablet Take 0.25 mg by mouth daily as needed for anxiety.    [name] amLODipine (NORVASC) 2.5 MG tablet Take 1 tablet (2.5 mg total) by mouth daily. 90 tablet 3  . atorvastatin (LIPITOR) 40 MG tablet Take 1 tablet (40 mg total) by mouth daily. 30 tablet 2  . azithromycin (ZITHROMAX Z-PAK) 250 MG tablet As directed 6 each 0  . Calcium Citrate (CITRACAL PO) Take 1 tablet by mouth daily.    . chlorpheniramine-HYDROcodone (TUSSIONEX PENNKINETIC ER) 10-8 MG/5ML SUER Take 5 mLs by mouth every 12 (twelve) hours as needed for cough. 140 mL 0  . losartan (COZAAR) 50 MG tablet Take 1 tablet (50 mg total) by mouth daily. 90 tablet 1  . methocarbamol (ROBAXIN) 500 MG tablet Take 1 tablet (500 mg total) by mouth 4 (four) times daily as needed for muscle spasms. 40 tablet 0  . omeprazole (PRILOSEC) 40 MG capsule Take 1 capsule (40 mg total) by mouth daily. 90 capsule 1  . rosuvastatin (CRESTOR) 20  MG tablet Take 1 tablet (20 mg total) daily by mouth. 30 tablet 2  . traZODone (DESYREL) 50 MG tablet Take 0.5-1 tablets (25-50 mg total) by mouth at bedtime as needed for sleep. 30 tablet 2   No current facility-administered medications for this visit.     Allergies  Allergen Reactions  . Contrast Media [Iodinated Diagnostic Agents] Itching and Rash    Pt covered in rash, red.  . Codeine Nausea And Vomiting    in large doses: can tolerate Tussionex    Past Medical History:  Diagnosis Date  . Arthritis    "right thumb" (01/29/2015)  . Chronic shoulder pain    "between my shoulders" (01/29/2015)  . Dizziness   . Esophageal stricture 2009  . Gallstones   . GERD (gastroesophageal reflux disease) 2004  . Hemorrhoids 2004  . Hiatal hernia 2004  . History of blood transfusion 1947   "appendix ruptured"  . HOH (hard of hearing)   . Hypertension   . Rhinitis, allergic   . Syncopal episodes     Social History   Socioeconomic History  . Marital status: Married    Spouse name: Not on file  . Number of children: 0  . Years of education: Not on file  . Highest education level: Not on file  Social Needs  .  Financial resource strain: Not on file  . Food insecurity - worry: Not on file  . Food insecurity - inability: Not on file  . Transportation needs - medical: Not on file  . Transportation needs - non-medical: Not on file  Occupational History  . 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 Smoking status: Never Smoker  . Smokeless tobacco: Never Used  Substance and Sexual Activity  . Alcohol use: No    Alcohol/week: 0.0 oz  . Drug use: No  . Sexual activity: No  Other Topics Concern  . Not on file  Social History Narrative   Patient doesn't get regular exercise   married     Family History  Problem Relation Age of Onset  . Stroke Father 6       light stroke  . Heart disease Sister   . Coronary artery disease Neg  Hx   . Diabetes Neg Hx   . Cancer Neg Hx        breast, colon, prostate     Review of Systems: General: negative for chills, fever, night sweats or weight changes.  Cardiovascular: negative for chest pain, dyspnea on exertion, edema, orthopnea, palpitations, paroxysmal nocturnal dyspnea or shortness of breath Dermatological: negative for rash Respiratory: negative for cough or wheezing Urologic: negative for hematuria Abdominal: negative for nausea, vomiting, diarrhea, bright red blood per rectum, melena, or hematemesis Neurologic: negative for visual changes, syncope, or dizziness All other systems reviewed and are otherwise negative except as noted above.    Blood pressure (!) 147/65, pulse (!) 57, height 5\' 6"  (1.676 m), weight 188 lb (85.3 kg).  General appearance: alert, cooperative and no distress Neck: no carotid bruit and no JVD Lungs: clear to auscultation bilaterally Heart: regular rate and rhythm Extremities: extremities normal, atraumatic, no cyanosis or edema Pulses: 2+ and symmetric Skin: Skin color, texture, turgor normal. No rashes or lesions Neurologic: Grossly normal  EKG NSR-78, PVC and PAC noted  ASSESSMENT AND PLAN:   Symptomatic PVCs Pt seen in the ED 05/19/17 with dizziness felt to be secondary to symptomatic PVCs, bigimeny.   Normal coronary arteries 2001 and April 2016 (after an abnormal Myoview)  Dyslipidemia On statin Rx, last LDL 147. PCP follows  Essential hypertension Controlled. Normal LVF by echo in March 2016  Palpitations She admits to occasional plantations, no sustained tachycardia.  Shortness of breath Vague SOB, no orhthopnea  History of syncope Syncope a year ago, felt to be orthostatic   PLAN  Check echo for LVF, if normal reassurance. Check Holter to r/o any significant bradycardia.   Kerin Ransom PA-C 05/26/2017 12:02 PM

## 2017-05-26 NOTE — Assessment & Plan Note (Signed)
Pt seen in the ED 05/19/17 with dizziness felt to be secondary to symptomatic PVCs, bigimeny.

## 2017-05-26 NOTE — Assessment & Plan Note (Signed)
2001 and April 2016 (after an abnormal Myoview)

## 2017-05-26 NOTE — Assessment & Plan Note (Signed)
Vague SOB, no orhthopnea

## 2017-05-26 NOTE — Assessment & Plan Note (Signed)
On statin Rx, last LDL 147. PCP follows

## 2017-05-26 NOTE — Assessment & Plan Note (Signed)
Controlled. Normal LVF by echo in March 2016

## 2017-05-26 NOTE — Patient Instructions (Signed)
Schedule echocardiogram   Schedule 1 week event monitor    Your physician recommends that you schedule a follow-up appointment with Dr.Berry in 3 months

## 2017-05-26 NOTE — Assessment & Plan Note (Signed)
Syncope a year ago, felt to be orthostatic

## 2017-06-01 ENCOUNTER — Telehealth: Payer: Self-pay | Admitting: Family Medicine

## 2017-06-01 ENCOUNTER — Other Ambulatory Visit: Payer: Self-pay

## 2017-06-01 ENCOUNTER — Ambulatory Visit (HOSPITAL_COMMUNITY): Payer: Medicare Other | Attending: Cardiology

## 2017-06-01 DIAGNOSIS — R002 Palpitations: Secondary | ICD-10-CM | POA: Insufficient documentation

## 2017-06-01 DIAGNOSIS — Z0389 Encounter for observation for other suspected diseases and conditions ruled out: Secondary | ICD-10-CM | POA: Insufficient documentation

## 2017-06-01 DIAGNOSIS — IMO0001 Reserved for inherently not codable concepts without codable children: Secondary | ICD-10-CM

## 2017-06-01 DIAGNOSIS — I10 Essential (primary) hypertension: Secondary | ICD-10-CM

## 2017-06-01 DIAGNOSIS — R0600 Dyspnea, unspecified: Secondary | ICD-10-CM | POA: Insufficient documentation

## 2017-06-01 DIAGNOSIS — I493 Ventricular premature depolarization: Secondary | ICD-10-CM | POA: Diagnosis not present

## 2017-06-01 DIAGNOSIS — E785 Hyperlipidemia, unspecified: Secondary | ICD-10-CM | POA: Diagnosis not present

## 2017-06-01 DIAGNOSIS — R0602 Shortness of breath: Secondary | ICD-10-CM | POA: Diagnosis not present

## 2017-06-01 MED ORDER — HYDROCOD POLST-CPM POLST ER 10-8 MG/5ML PO SUER: 5.0000 mL | Freq: Two times a day (BID) | ORAL | 0 refills | Status: DC | PRN

## 2017-06-01 MED FILL — HYDROCODONE-CHLORPHENIRAM S: 10-8 | 14 days supply | Qty: 140 | Fill #0

## 2017-06-01 NOTE — Telephone Encounter (Signed)
Copied from [place]. Topic: General - Other >> Jun 01, 2017  1:09 PM Neva Seat wrote:   Pt called [facility] [phone] to have her Tussionex cough syrup refilled.  Pharmacy told her she needs to have Dr. Etter Sjogren refill her Rx request.

## 2017-06-01 NOTE — Telephone Encounter (Signed)
Pt wanted it sent to [facility] on [address] as listed on her pharmacy preference list.

## 2017-06-01 NOTE — Telephone Encounter (Signed)
Refill routed to provider.

## 2017-06-01 NOTE — Telephone Encounter (Signed)
Please advise in PCP absence.  

## 2017-06-01 NOTE — Telephone Encounter (Signed)
Sent, please cancel the prescription sent to downstairs

## 2017-06-01 NOTE — Telephone Encounter (Signed)
The medication is as needed per PCP thus will refill  Advised patient, Rx sent downstairs

## 2017-06-02 ENCOUNTER — Ambulatory Visit (INDEPENDENT_AMBULATORY_CARE_PROVIDER_SITE_OTHER): Payer: Medicare Other

## 2017-06-02 DIAGNOSIS — I493 Ventricular premature depolarization: Secondary | ICD-10-CM

## 2017-06-02 DIAGNOSIS — Z0389 Encounter for observation for other suspected diseases and conditions ruled out: Secondary | ICD-10-CM

## 2017-06-02 DIAGNOSIS — I10 Essential (primary) hypertension: Secondary | ICD-10-CM | POA: Diagnosis not present

## 2017-06-02 DIAGNOSIS — R002 Palpitations: Secondary | ICD-10-CM | POA: Diagnosis not present

## 2017-06-02 DIAGNOSIS — E785 Hyperlipidemia, unspecified: Secondary | ICD-10-CM | POA: Diagnosis not present

## 2017-06-02 DIAGNOSIS — R0602 Shortness of breath: Secondary | ICD-10-CM | POA: Diagnosis not present

## 2017-06-02 DIAGNOSIS — IMO0001 Reserved for inherently not codable concepts without codable children: Secondary | ICD-10-CM

## 2017-06-09 ENCOUNTER — Other Ambulatory Visit: Payer: Self-pay | Admitting: Family Medicine

## 2017-06-26 ENCOUNTER — Emergency Department (HOSPITAL_COMMUNITY)
Admission: EM | Admit: 2017-06-26 | Discharge: 2017-06-27 | Disposition: A | Discharge status: Home / Self Care | Payer: Medicare Other | Attending: Emergency Medicine | Admitting: Emergency Medicine

## 2017-06-26 DIAGNOSIS — A084 Viral intestinal infection, unspecified: Secondary | ICD-10-CM | POA: Diagnosis not present

## 2017-06-26 DIAGNOSIS — R197 Diarrhea, unspecified: Secondary | ICD-10-CM | POA: Diagnosis present

## 2017-06-26 DIAGNOSIS — R110 Nausea: Secondary | ICD-10-CM | POA: Diagnosis not present

## 2017-06-26 DIAGNOSIS — R112 Nausea with vomiting, unspecified: Secondary | ICD-10-CM | POA: Diagnosis not present

## 2017-06-26 DIAGNOSIS — I10 Essential (primary) hypertension: Secondary | ICD-10-CM | POA: Diagnosis not present

## 2017-06-26 DIAGNOSIS — Z79899 Other long term (current) drug therapy: Secondary | ICD-10-CM | POA: Diagnosis not present

## 2017-06-26 DIAGNOSIS — R030 Elevated blood-pressure reading, without diagnosis of hypertension: Secondary | ICD-10-CM | POA: Diagnosis not present

## 2017-06-26 LAB — CBC WITH DIFFERENTIAL/PLATELET
Basophils Absolute: 0 10*3/uL (ref 0.0–0.1)
Basophils Relative: 0 %
EOS PCT: 1 %
Eosinophils Absolute: 0.2 10*3/uL (ref 0.0–0.7)
HCT: 37.4 % (ref 36.0–46.0)
Hemoglobin: 12 g/dL (ref 12.0–15.0)
LYMPHS ABS: 1.2 10*3/uL (ref 0.7–4.0)
Lymphocytes Relative: 9 %
MCH: 28.4 pg (ref 26.0–34.0)
MCHC: 32.1 g/dL (ref 30.0–36.0)
MCV: 88.4 fL (ref 78.0–100.0)
MONO ABS: 1 10*3/uL (ref 0.1–1.0)
MONOS PCT: 7 %
Neutro Abs: 11.6 10*3/uL — ABNORMAL HIGH (ref 1.7–7.7)
Neutrophils Relative %: 83 %
PLATELETS: 263 10*3/uL (ref 150–400)
RBC: 4.23 MIL/uL (ref 3.87–5.11)
RDW: 15.2 % (ref 11.5–15.5)
WBC: 14 10*3/uL — ABNORMAL HIGH (ref 4.0–10.5)

## 2017-06-26 MED ORDER — ONDANSETRON HCL 4 MG/2ML IJ SOLN
4.0000 mg | Freq: Once | INTRAMUSCULAR | Status: AC
  Administered 2017-06-26: 4 mg via INTRAVENOUS
  Filled 2017-06-26: qty 2

## 2017-06-26 MED ORDER — SODIUM CHLORIDE 0.9 % IV BOLUS (SEPSIS)
1000.0000 mL | Freq: Once | INTRAVENOUS | Status: AC
  Administered 2017-06-26: 1000 mL via INTRAVENOUS

## 2017-06-26 NOTE — ED Provider Notes (Signed)
[facility] DEPT Provider Note: Georgena Spurling, MD, FACEP  CSN: [id number] MRN: [id number] ARRIVAL: 06/26/17 at [place]: [place]  Vomiting   HISTORY OF PRESENT ILLNESS  06/26/17 11:18 PM Deanna Shields is a 84 y.o. female who had the fairly sudden onset of nausea, vomiting and diarrhea about 7 PM.  She describes the symptoms as "quite much", meaning multiple episodes of vomiting and diarrhea.  She is not aware of passing any blood.  She denies any chest pain, shortness of breath or abdominal pain.  She is feeling weak and fatigued.  She was also feeling chilled and shaky earlier.  She has not had a fever to her knowledge.  She states she has a cough but this is chronic.  She denies any acute cold symptoms.  An IV was started prior to arrival but she was given no fluids or medications.   Past Medical History:  Diagnosis Date  . Arthritis    "right thumb" (01/29/2015)  . Chronic shoulder pain    "between my shoulders" (01/29/2015)  . Dizziness   . Esophageal stricture 2009  . Gallstones   . GERD (gastroesophageal reflux disease) 2004  . Hemorrhoids 2004  . Hiatal hernia 2004  . History of blood transfusion 1947   "appendix ruptured"  . HOH (hard of hearing)   . Hypertension   . Rhinitis, allergic   . Syncopal episodes     Past Surgical History:  Procedure Laterality Date  . APPENDECTOMY  1947   "ruptured"  . CARDIAC CATHETERIZATION  10/23/1999; 09/2014   EF 60% No significant CAD; patent coronary arteries.  . CARPAL TUNNEL RELEASE Right 1990's  . CHOLECYSTECTOMY N/A 01/29/2015   Procedure: LAPAROSCOPIC CHOLECYSTECTOMY;  Surgeon: Rolm Bookbinder, MD;  Location: [facility];  Service: General;  Laterality: N/A;  . DILATION AND CURETTAGE OF UTERUS  "couple"  . ESOPHAGOGASTRODUODENOSCOPY (EGD) WITH ESOPHAGEAL DILATION  2-3 times  . HEMORROIDECTOMY    . KNEE ARTHROSCOPY Bilateral    right x 2  . LAPAROSCOPIC CHOLECYSTECTOMY  01/29/2015  . LEFT HEART  CATHETERIZATION WITH CORONARY ANGIOGRAM N/A 09/16/2014   Procedure: LEFT HEART CATHETERIZATION WITH CORONARY ANGIOGRAM;  Surgeon: [name], MD;  Location: [facility] CATH LAB;  Service: Cardiovascular;  Laterality: N/A;  . NM MYOVIEW LTD  06/01/2012   EF 71%  . VAGINAL HYSTERECTOMY  1970's    Family History  Problem Relation Age of Onset  . Stroke Father 60       light stroke  . Heart disease Sister   . Coronary artery disease Neg Hx   . Diabetes Neg Hx   . Cancer Neg Hx        breast, colon, prostate    Social History   Tobacco Use  . Smoking status: Never Smoker  . Smokeless tobacco: Never Used  Substance Use Topics  . Alcohol use: No    Alcohol/week: 0.0 oz  . Drug use: No    Prior to Admission medications   Medication Sig Start Date End Date Taking? Authorizing Provider  amLODipine (NORVASC) 2.5 MG tablet Take 1 tablet (2.5 mg total) by mouth daily. 07/23/16  Yes [name], MD  Calcium Citrate (CITRACAL PO) Take 1 tablet by mouth daily.   Yes [provider]  chlorpheniramine-HYDROcodone (TUSSIONEX PENNKINETIC ER) 10-8 MG/5ML SUER Take 5 mLs by mouth every 12 (twelve) hours as needed for cough. 06/01/17  Yes Paz, Alda Berthold, MD  losartan (COZAAR) 50 MG tablet TAKE 1  TABLET DAILY 06/09/17  Yes Roma Schanz R, DO  methocarbamol (ROBAXIN) 500 MG tablet Take 1 tablet (500 mg total) by mouth 4 (four) times daily as needed for muscle spasms. 03/01/17  Yes [name], DO  traZODone (DESYREL) 50 MG tablet Take 0.5-1 tablets (25-50 mg total) by mouth at bedtime as needed for sleep. 11/25/16  Yes Roma Schanz R, DO  atorvastatin (LIPITOR) 40 MG tablet Take 1 tablet (40 mg total) by mouth daily. Patient not taking: Reported on 06/27/2017 04/07/17   Carollee Herter, Alferd Apa, DO  azithromycin (ZITHROMAX Z-PAK) 250 MG tablet As directed Patient not taking: Reported on 06/27/2017 03/31/17   Carollee Herter, Alferd Apa, DO  omeprazole (PRILOSEC) 40 MG capsule Take 1  capsule (40 mg total) by mouth daily. Patient not taking: Reported on 06/27/2017 02/14/17   Roma Schanz R, DO  rosuvastatin (CRESTOR) 20 MG tablet Take 1 tablet (20 mg total) daily by mouth. Patient not taking: Reported on 06/27/2017 04/26/17   Roma Schanz R, DO    Allergies Contrast media [iodinated diagnostic agents] and Codeine   REVIEW OF SYSTEMS  Negative except as noted here or in the History of Present Illness.   PHYSICAL EXAMINATION  Initial Vital Signs Blood pressure (!) 160/71, pulse 77, temperature 97.6 F (36.4 C), temperature source Oral, resp. rate 17, SpO2 100 %.  Examination General: Well-developed, well-nourished female in no acute distress; appearance consistent with age of record HENT: normocephalic; atraumatic; dry mucous membranes Eyes: pupils equal, round and reactive to light; extraocular muscles intact; lens implants Neck: supple Heart: regular rate and rhythm Lungs: clear to auscultation bilaterally Abdomen: soft; nondistended; nontender; no masses or hepatosplenomegaly; bowel sounds present Extremities: No deformity; full range of motion; pulses normal; trace edema of lower legs Neurologic: Awake, alert and oriented; motor function intact in all extremities and symmetric; no facial droop Skin: Warm and dry Psychiatric: Normal mood and affect   RESULTS  Summary of this visit's results, reviewed by myself:   EKG Interpretation  Date/Time:    Ventricular Rate:    PR Interval:    QRS Duration:   QT Interval:    QTC Calculation:   R Axis:     Text Interpretation:        Laboratory Studies: Results for orders placed or performed during the hospital encounter of 06/26/17 (from the past 24 hour(s))  CBC with Differential/Platelet     Status: Abnormal   Collection Time: 06/26/17 11:30 PM  Result Value Ref Range   WBC 14.0 (H) 4.0 - 10.5 K/uL   RBC 4.23 3.87 - 5.11 MIL/uL   Hemoglobin 12.0 12.0 - 15.0 g/dL   HCT 37.4 36.0 - 46.0 %     MCV 88.4 78.0 - 100.0 fL   MCH 28.4 26.0 - 34.0 pg   MCHC 32.1 30.0 - 36.0 g/dL   RDW 15.2 11.5 - 15.5 %   Platelets 263 150 - 400 K/uL   Neutrophils Relative % 83 %   Neutro Abs 11.6 (H) 1.7 - 7.7 K/uL   Lymphocytes Relative 9 %   Lymphs Abs 1.2 0.7 - 4.0 K/uL   Monocytes Relative 7 %   Monocytes Absolute 1.0 0.1 - 1.0 K/uL   Eosinophils Relative 1 %   Eosinophils Absolute 0.2 0.0 - 0.7 K/uL   Basophils Relative 0 %   Basophils Absolute 0.0 0.0 - 0.1 K/uL  Basic metabolic panel     Status: Abnormal   Collection Time:  06/26/17 11:30 PM  Result Value Ref Range   Sodium 138 135 - 145 mmol/L   Potassium 3.9 3.5 - 5.1 mmol/L   Chloride 106 101 - 111 mmol/L   CO2 25 22 - 32 mmol/L   Glucose, Bld 119 (H) 65 - 99 mg/dL   BUN 22 (H) 6 - 20 mg/dL   Creatinine, Ser 1.10 (H) 0.44 - 1.00 mg/dL   Calcium 8.8 (L) 8.9 - 10.3 mg/dL   GFR calc non Af Amer 44 (L) >60 mL/min   GFR calc Af Amer 51 (L) >60 mL/min   Anion gap 7 5 - 15  Urinalysis, Routine w reflex microscopic     Status: Abnormal   Collection Time: 06/27/17 12:47 AM  Result Value Ref Range   Color, Urine YELLOW YELLOW   APPearance CLEAR CLEAR   Specific Gravity, Urine 1.009 1.005 - 1.030   pH 5.0 5.0 - 8.0   Glucose, UA NEGATIVE NEGATIVE mg/dL   Hgb urine dipstick SMALL (A) NEGATIVE   Bilirubin Urine NEGATIVE NEGATIVE   Ketones, ur NEGATIVE NEGATIVE mg/dL   Protein, ur NEGATIVE NEGATIVE mg/dL   Nitrite NEGATIVE NEGATIVE   Leukocytes, UA NEGATIVE NEGATIVE   RBC / HPF 0-5 0 - 5 RBC/hpf   WBC, UA 0-5 0 - 5 WBC/hpf   Bacteria, UA FEW (A) NONE SEEN   Squamous Epithelial / LPF 0-5 (A) NONE SEEN   Mucus PRESENT    Imaging Studies: No results found.  ED COURSE  Nursing notes and initial vitals signs, including pulse oximetry, reviewed.  Vitals:   06/27/17 0117 06/27/17 0130 06/27/17 0145 06/27/17 0200  BP: 139/76   (!) 143/72  Pulse: 81 82 84 85  Resp: 17 17 18 15   Temp:      TempSrc:      SpO2: 97% 96% 96% 97%    2:46 AM She is feeling much better after IV fluid bolus and Zofran.  She is drinking fluids without emesis.  PROCEDURES    ED DIAGNOSES     ICD-10-CM   1. Viral gastroenteritis A08.4        Mayara Paulson, MD 06/27/17 [phone]

## 2017-06-26 NOTE — ED Notes (Signed)
Bed: QF90 Expected date:  Expected time:  Means of arrival:  Comments: N&V Weakness

## 2017-06-26 NOTE — ED Triage Notes (Addendum)
Pt comes from [facility] with complaints of nausea, vomiting, and weakness that started a few hours ago.  Pt shaky on arrival stating she is freezing.  A&O x4. Denies pain. Vitals stable in route. CBG 139.

## 2017-06-27 ENCOUNTER — Other Ambulatory Visit: Payer: Medicare Other

## 2017-06-27 DIAGNOSIS — A084 Viral intestinal infection, unspecified: Secondary | ICD-10-CM | POA: Diagnosis not present

## 2017-06-27 LAB — URINALYSIS, ROUTINE W REFLEX MICROSCOPIC
Bilirubin Urine: NEGATIVE
GLUCOSE, UA: NEGATIVE mg/dL
KETONES UR: NEGATIVE mg/dL
Leukocytes, UA: NEGATIVE
Nitrite: NEGATIVE
PH: 5 (ref 5.0–8.0)
Protein, ur: NEGATIVE mg/dL
Specific Gravity, Urine: 1.009 (ref 1.005–1.030)

## 2017-06-27 LAB — BASIC METABOLIC PANEL
Anion gap: 7 (ref 5–15)
BUN: 22 mg/dL — AB (ref 6–20)
CALCIUM: 8.8 mg/dL — AB (ref 8.9–10.3)
CO2: 25 mmol/L (ref 22–32)
CREATININE: 1.1 mg/dL — AB (ref 0.44–1.00)
Chloride: 106 mmol/L (ref 101–111)
GFR calc Af Amer: 51 mL/min — ABNORMAL LOW (ref >60)
GFR, EST NON AFRICAN AMERICAN: 44 mL/min — AB (ref >60)
GLUCOSE: 119 mg/dL — AB (ref 65–99)
POTASSIUM: 3.9 mmol/L (ref 3.5–5.1)
SODIUM: 138 mmol/L (ref 135–145)

## 2017-06-27 MED ORDER — ONDANSETRON 8 MG PO TBDP: 8.0000 mg | ORAL_TABLET | Freq: Three times a day (TID) | ORAL | 0 refills | Status: DC | PRN

## 2017-06-27 NOTE — ED Notes (Signed)
Pt. In room awaiting for nephew to pick her up the patient. Got denied on [facility]

## 2017-06-27 NOTE — ED Notes (Signed)
[facility] called for transport.  

## 2017-07-01 MED FILL — traZODone HCL 50 MG TABS: 50 | 30 days supply | Qty: 30 | Fill #1

## 2017-07-06 ENCOUNTER — Other Ambulatory Visit: Payer: Self-pay | Admitting: Family Medicine

## 2017-07-06 DIAGNOSIS — G4700 Insomnia, unspecified: Secondary | ICD-10-CM

## 2017-07-08 ENCOUNTER — Other Ambulatory Visit: Payer: Medicare Other

## 2017-07-11 ENCOUNTER — Encounter: Payer: Self-pay | Admitting: Family Medicine

## 2017-07-11 ENCOUNTER — Other Ambulatory Visit: Payer: Medicare Other

## 2017-07-11 ENCOUNTER — Ambulatory Visit (INDEPENDENT_AMBULATORY_CARE_PROVIDER_SITE_OTHER): Payer: Medicare Other | Admitting: Family Medicine

## 2017-07-11 VITALS — BP 132/68 | HR 81 | Temp 98.4°F | Resp 16 | Ht 66.0 in | Wt 184.0 lb

## 2017-07-11 DIAGNOSIS — R05 Cough: Secondary | ICD-10-CM | POA: Diagnosis not present

## 2017-07-11 DIAGNOSIS — I10 Essential (primary) hypertension: Secondary | ICD-10-CM

## 2017-07-11 DIAGNOSIS — E785 Hyperlipidemia, unspecified: Secondary | ICD-10-CM | POA: Diagnosis not present

## 2017-07-11 DIAGNOSIS — R059 Cough, unspecified: Secondary | ICD-10-CM

## 2017-07-11 DIAGNOSIS — K529 Noninfective gastroenteritis and colitis, unspecified: Secondary | ICD-10-CM

## 2017-07-11 DIAGNOSIS — R739 Hyperglycemia, unspecified: Secondary | ICD-10-CM | POA: Diagnosis not present

## 2017-07-11 DIAGNOSIS — R110 Nausea: Secondary | ICD-10-CM | POA: Diagnosis not present

## 2017-07-11 LAB — COMPREHENSIVE METABOLIC PANEL
ALBUMIN: 4.2 g/dL (ref 3.5–5.2)
ALK PHOS: 74 U/L (ref 39–117)
ALT: 14 U/L (ref 0–35)
AST: 23 U/L (ref 0–37)
BUN: 16 mg/dL (ref 6–23)
CALCIUM: 10.1 mg/dL (ref 8.4–10.5)
CO2: 31 mEq/L (ref 19–32)
Chloride: 106 mEq/L (ref 96–112)
Creatinine, Ser: 1.22 mg/dL — ABNORMAL HIGH (ref 0.40–1.20)
GFR: 44.33 mL/min — ABNORMAL LOW (ref >60.00)
Glucose, Bld: 109 mg/dL — ABNORMAL HIGH (ref 70–99)
POTASSIUM: 5.8 meq/L — AB (ref 3.5–5.1)
Sodium: 144 mEq/L (ref 135–145)
TOTAL PROTEIN: 7 g/dL (ref 6.0–8.3)
Total Bilirubin: 0.9 mg/dL (ref 0.2–1.2)

## 2017-07-11 LAB — LIPID PANEL
CHOLESTEROL: 163 mg/dL (ref 0–200)
HDL: 54.3 mg/dL (ref >39.00)
LDL CALC: 78 mg/dL (ref 0–99)
NonHDL: 108.82
TRIGLYCERIDES: 156 mg/dL — AB (ref 0.0–149.0)
Total CHOL/HDL Ratio: 3
VLDL: 31.2 mg/dL (ref 0.0–40.0)

## 2017-07-11 LAB — CBC WITH DIFFERENTIAL/PLATELET
BASOS PCT: 0.7 % (ref 0.0–3.0)
Basophils Absolute: 0.1 10*3/uL (ref 0.0–0.1)
EOS ABS: 0.3 10*3/uL (ref 0.0–0.7)
EOS PCT: 3.7 % (ref 0.0–5.0)
HEMATOCRIT: 41.5 % (ref 36.0–46.0)
HEMOGLOBIN: 13.3 g/dL (ref 12.0–15.0)
LYMPHS PCT: 32.2 % (ref 12.0–46.0)
Lymphs Abs: 2.8 10*3/uL (ref 0.7–4.0)
MCHC: 32 g/dL (ref 30.0–36.0)
MCV: 88.4 fl (ref 78.0–100.0)
Monocytes Absolute: 0.6 10*3/uL (ref 0.1–1.0)
Monocytes Relative: 7.1 % (ref 3.0–12.0)
NEUTROS ABS: 4.9 10*3/uL (ref 1.4–7.7)
Neutrophils Relative %: 56.3 % (ref 43.0–77.0)
PLATELETS: 264 10*3/uL (ref 150.0–400.0)
RBC: 4.69 Mil/uL (ref 3.87–5.11)
RDW: 15.9 % — AB (ref 11.5–15.5)
WBC: 8.7 10*3/uL (ref 4.0–10.5)

## 2017-07-11 LAB — HEMOGLOBIN A1C: Hgb A1c MFr Bld: 5.8 % (ref 4.6–6.5)

## 2017-07-11 MED ORDER — ONDANSETRON 8 MG PO TBDP: 8.0000 mg | ORAL_TABLET | Freq: Three times a day (TID) | ORAL | 0 refills | Status: DC | PRN

## 2017-07-11 MED ORDER — HYDROCOD POLST-CPM POLST ER 10-8 MG/5ML PO SUER: 5.0000 mL | Freq: Two times a day (BID) | ORAL | 0 refills | Status: DC | PRN

## 2017-07-11 NOTE — Patient Instructions (Signed)

## 2017-07-11 NOTE — Progress Notes (Signed)
Patient ID: Deanna Shields, female   DOB: 10-May-1929, 84 y.o.   MRN: [id number]    Subjective:  I acted as a Education administrator for Dr. Carollee Herter.  Deanna Shields, Deanna Shields   Patient ID: Deanna Shields, female    DOB: Aug 17, 1928, 84 y.o.   MRN: [id number]  Chief Complaint  Patient presents with  . Hospitalization Follow-up    HPI  Patient is in today for ER follow up viral gastroenteritis.  She states that she feels so much better.   She also want to get her blood work done. No new complaints  Patient Care Team: Carollee Herter, Alferd Apa, DO as PCP - General [name] Pearletha Forge, MD as Consulting Physician (Cardiology)   Past Medical History:  Diagnosis Date  . Arthritis    "right thumb" (01/29/2015)  . Chronic shoulder pain    "between my shoulders" (01/29/2015)  . Dizziness   . Esophageal stricture 2009  . Gallstones   . GERD (gastroesophageal reflux disease) 2004  . Hemorrhoids 2004  . Hiatal hernia 2004  . History of blood transfusion 1947   "appendix ruptured"  . HOH (hard of hearing)   . Hypertension   . Rhinitis, allergic   . Syncopal episodes     Past Surgical History:  Procedure Laterality Date  . APPENDECTOMY  1947   "ruptured"  . CARDIAC CATHETERIZATION  10/23/1999; 09/2014   EF 60% No significant CAD; patent coronary arteries.  . CARPAL TUNNEL RELEASE Right 1990's  . CHOLECYSTECTOMY N/A 01/29/2015   Procedure: LAPAROSCOPIC CHOLECYSTECTOMY;  Surgeon: Rolm Bookbinder, MD;  Location: [facility];  Service: General;  Laterality: N/A;  . DILATION AND CURETTAGE OF UTERUS  "couple"  . ESOPHAGOGASTRODUODENOSCOPY (EGD) WITH ESOPHAGEAL DILATION  2-3 times  . HEMORROIDECTOMY    . KNEE ARTHROSCOPY Bilateral    right x 2  . LAPAROSCOPIC CHOLECYSTECTOMY  01/29/2015  . LEFT HEART CATHETERIZATION WITH CORONARY ANGIOGRAM N/A 09/16/2014   Procedure: LEFT HEART CATHETERIZATION WITH CORONARY ANGIOGRAM;  Surgeon: [name], MD;  Location: [facility] C[facility] LAB;  Service: Cardiovascular;  Laterality: N/A;  .  NM MYOVIEW LTD  06/01/2012   EF 71%  . VAGINAL HYSTERECTOMY  1970's    Family History  Problem Relation Age of Onset  . Stroke Father 47       light stroke  . Heart disease Sister   . Coronary artery disease Neg Hx   . Diabetes Neg Hx   . Cancer Neg Hx        breast, colon, prostate    Social History   Socioeconomic History  . Marital status: Married    Spouse name: Not on file  . Number of children: 0  . Years of education: Not on file  . Highest education level: Not on file  Social Needs  . Financial resource strain: Not on file  . Food insecurity - worry: Not on file  . Food insecurity - inability: Not on file  . Transportation needs - medical: Not on file  . Transportation needs - non-medical: Not on file  Occupational History  . 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 Smoking status: Never Smoker  . Smokeless tobacco: Never Used  Substance and Sexual Activity  . Alcohol use: No    Alcohol/week: 0.0 oz  . Drug use: No  . Sexual activity: No  Other Topics Concern  . Not on file  Social History Narrative   Patient doesn't  get regular exercise   married    Outpatient Medications Prior to Visit  Medication Sig Dispense Refill  . amLODipine (NORVASC) 2.5 MG tablet Take 1 tablet (2.5 mg total) by mouth daily. 90 tablet 3  . Calcium Citrate (CITRACAL PO) Take 1 tablet by mouth daily.    [name] losartan (COZAAR) 50 MG tablet TAKE 1 TABLET DAILY 90 tablet 1  . methocarbamol (ROBAXIN) 500 MG tablet Take 1 tablet (500 mg total) by mouth 4 (four) times daily as needed for muscle spasms. 40 tablet 0  . traZODone (DESYREL) 50 MG tablet TAKE 1/2-1 TABLET BY MOUTH EVERY NIGHT AT BEDTIME AS NEEDED FOR SLEEP 30 tablet 0  . chlorpheniramine-HYDROcodone (TUSSIONEX PENNKINETIC ER) 10-8 MG/5ML SUER Take 5 mLs by mouth every 12 (twelve) hours as needed for cough. 140 mL 0  . ondansetron (ZOFRAN ODT) 8 MG disintegrating tablet Take 1  tablet (8 mg total) by mouth every 8 (eight) hours as needed for nausea or vomiting. 10 tablet 0   No facility-administered medications prior to visit.     Allergies  Allergen Reactions  . Contrast Media [Iodinated Diagnostic Agents] Itching and Rash    Pt covered in rash, red.  . Codeine Nausea And Vomiting    in large doses: can tolerate Tussionex    Review of Systems  Constitutional: Negative for fever and malaise/fatigue.  HENT: Negative for congestion.   Eyes: Negative for blurred vision.  Respiratory: Negative for cough and shortness of breath.   Cardiovascular: Negative for chest pain, palpitations and leg swelling.  Gastrointestinal: Negative for vomiting.  Musculoskeletal: Negative for back pain.  Skin: Negative for rash.  Neurological: Negative for loss of consciousness and headaches.       Objective:    Physical Exam  Constitutional: She is oriented to person, place, and time. She appears well-developed and well-nourished.  HENT:  Head: Normocephalic and atraumatic.  Eyes: Conjunctivae and EOM are normal.  Neck: Normal range of motion. Neck supple. No JVD present. Carotid bruit is not present. No thyromegaly present.  Cardiovascular: Normal rate, regular rhythm and normal heart sounds.  No murmur heard. Pulmonary/Chest: Effort normal and breath sounds normal. No respiratory distress. She has no wheezes. She has no rales. She exhibits no tenderness.  Musculoskeletal: She exhibits no edema.  Neurological: She is alert and oriented to person, place, and time.  Psychiatric: She has a normal mood and affect.  Nursing note and vitals reviewed.   BP 132/68 (BP Location: Right Arm, Cuff Size: Normal)   Pulse 81   Temp 98.4 F (36.9 C) (Oral)   Resp 16   Ht 5\' 6"  (1.676 m)   Wt 184 lb (83.5 kg)   SpO2 96%   BMI 29.70 kg/m  Wt Readings from Last 3 Encounters:  07/11/17 184 lb (83.5 kg)  05/26/17 188 lb (85.3 kg)  03/31/17 185 lb (83.9 kg)   BP Readings from  Last 3 Encounters:  07/11/17 132/68  06/27/17 138/75  05/26/17 (!) 147/65     Immunization History  Administered Date(s) Administered  . Influenza Split 03/16/2011, 03/01/2012  . Influenza Whole 03/28/2007, 04/01/2009, 02/18/2010  . Influenza, High Dose Seasonal PF 03/14/2013  . Influenza,inj,Quad PF,6+ Mos 03/20/2014, 03/11/2015  . Pneumococcal Conjugate-13 02/25/2015  . Pneumococcal Polysaccharide-23 03/28/2007  . Td 12/20/2005    Health Maintenance  Topic Date Due  . MAMMOGRAM  09/15/2013  . TETANUS/TDAP  12/21/2015  . INFLUENZA VACCINE  Completed  . DEXA SCAN  Completed  . PNA  vac Low Risk Adult  Completed    Lab Results  Component Value Date   WBC 8.7 07/11/2017   HGB 13.3 07/11/2017   HCT 41.5 07/11/2017   PLT 264.0 07/11/2017   GLUCOSE 109 (H) 07/11/2017   CHOL 163 07/11/2017   TRIG 156.0 (H) 07/11/2017   HDL 54.30 07/11/2017   LDLDIRECT 161.2 07/18/2013   LDLCALC 78 07/11/2017   ALT 14 07/11/2017   AST 23 07/11/2017   NA 144 07/11/2017   K 5.8 (H) 07/11/2017   CL 106 07/11/2017   CREATININE 1.22 (H) 07/11/2017   BUN 16 07/11/2017   CO2 31 07/11/2017   TSH 2.780 10/28/2013   INR 0.99 09/09/2014   HGBA1C 5.8 07/11/2017    Lab Results  Component Value Date   TSH 2.780 10/28/2013   Lab Results  Component Value Date   WBC 8.7 07/11/2017   HGB 13.3 07/11/2017   HCT 41.5 07/11/2017   MCV 88.4 07/11/2017   PLT 264.0 07/11/2017   Lab Results  Component Value Date   NA 144 07/11/2017   K 5.8 (H) 07/11/2017   CO2 31 07/11/2017   GLUCOSE 109 (H) 07/11/2017   BUN 16 07/11/2017   CREATININE 1.22 (H) 07/11/2017   BILITOT 0.9 07/11/2017   ALKPHOS 74 07/11/2017   AST 23 07/11/2017   ALT 14 07/11/2017   PROT 7.0 07/11/2017   ALBUMIN 4.2 07/11/2017   CALCIUM 10.1 07/11/2017   ANIONGAP 7 06/26/2017   GFR 44.33 (L) 07/11/2017   Lab Results  Component Value Date   CHOL 163 07/11/2017   Lab Results  Component Value Date   HDL 54.30 07/11/2017    Lab Results  Component Value Date   LDLCALC 78 07/11/2017   Lab Results  Component Value Date   TRIG 156.0 (H) 07/11/2017   Lab Results  Component Value Date   CHOLHDL 3 07/11/2017   Lab Results  Component Value Date   HGBA1C 5.8 07/11/2017         Assessment & Plan:   Problem List Items Addressed This Visit      Unprioritized   COUGH, CHRONIC   Relevant Medications   chlorpheniramine-HYDROcodone (TUSSIONEX PENNKINETIC ER) 10-8 MG/5ML SUER   Dyslipidemia    Encouraged heart healthy diet, increase exercise, avoid trans fats, consider a krill oil cap daily       Essential hypertension - Primary (Chronic)    Well controlled, no changes to meds. Encouraged heart healthy diet such as the DASH diet and exercise as tolerated.       Relevant Orders   Comprehensive metabolic panel (Completed)   Lipid panel (Completed)   CBC with Differential/Platelet (Completed)   Gastroenteritis    Symptoms resolved       Other Visit Diagnoses    Hyperglycemia       Relevant Orders   Hemoglobin A1c (Completed)   Comprehensive metabolic panel (Completed)   CBC with Differential/Platelet (Completed)   Nausea       Relevant Medications   ondansetron (ZOFRAN ODT) 8 MG disintegrating tablet      I am having Deanna Shields maintain her Calcium Citrate (CITRACAL PO), amLODipine, methocarbamol, losartan, traZODone, ondansetron, and chlorpheniramine-HYDROcodone.  Meds ordered this encounter  Medications  . ondansetron (ZOFRAN ODT) 8 MG disintegrating tablet    Sig: Take 1 tablet (8 mg total) by mouth every 8 (eight) hours as needed for nausea or vomiting.    Dispense:  10 tablet    Refill:  0  .  chlorpheniramine-HYDROcodone (TUSSIONEX PENNKINETIC ER) 10-8 MG/5ML SUER    Sig: Take 5 mLs by mouth every 12 (twelve) hours as needed for cough.    Dispense:  140 mL    Refill:  0    CMA served as scribe during this visit. History, Physical and Plan performed by medical provider.  Documentation and orders reviewed and attested to.  [name], DO

## 2017-07-13 ENCOUNTER — Telehealth: Payer: Self-pay | Admitting: Family Medicine

## 2017-07-13 ENCOUNTER — Other Ambulatory Visit: Payer: Self-pay | Admitting: *Deleted

## 2017-07-13 DIAGNOSIS — E875 Hyperkalemia: Secondary | ICD-10-CM

## 2017-07-13 DIAGNOSIS — K529 Noninfective gastroenteritis and colitis, unspecified: Secondary | ICD-10-CM | POA: Insufficient documentation

## 2017-07-13 NOTE — Assessment & Plan Note (Signed)
Well controlled, no changes to meds. Encouraged heart healthy diet such as the DASH diet and exercise as tolerated.  °

## 2017-07-13 NOTE — Assessment & Plan Note (Signed)
Symptoms resolved 

## 2017-07-13 NOTE — Telephone Encounter (Signed)
Pt returned called. Pt voiced understanding that she should come back in to have a bmp rechecked because of an elevation in the potassium. Lab appointment made for in the morning.

## 2017-07-13 NOTE — Assessment & Plan Note (Signed)
Encouraged heart healthy diet, increase exercise, avoid trans fats, consider a krill oil cap daily 

## 2017-07-14 ENCOUNTER — Other Ambulatory Visit (INDEPENDENT_AMBULATORY_CARE_PROVIDER_SITE_OTHER): Payer: Medicare Other

## 2017-07-14 DIAGNOSIS — E875 Hyperkalemia: Secondary | ICD-10-CM

## 2017-07-14 LAB — BASIC METABOLIC PANEL
BUN: 13 mg/dL (ref 6–23)
CO2: 29 mEq/L (ref 19–32)
Calcium: 9.2 mg/dL (ref 8.4–10.5)
Chloride: 105 mEq/L (ref 96–112)
Creatinine, Ser: 1.24 mg/dL — ABNORMAL HIGH (ref 0.40–1.20)
GFR: 43.51 mL/min — AB (ref >60.00)
Glucose, Bld: 101 mg/dL — ABNORMAL HIGH (ref 70–99)
POTASSIUM: 4.3 meq/L (ref 3.5–5.1)
Sodium: 141 mEq/L (ref 135–145)

## 2017-07-18 ENCOUNTER — Telehealth: Payer: Self-pay | Admitting: *Deleted

## 2017-07-18 NOTE — Telephone Encounter (Signed)
Copied from [place]. Topic: Inquiry >> Jul 15, 2017 12:12 PM Arletha Grippe wrote: Reason for CRM: [phone] Pt calling about labs, please call when they are ready

## 2017-07-18 NOTE — Telephone Encounter (Signed)
Patient notified

## 2017-07-26 ENCOUNTER — Other Ambulatory Visit: Payer: Self-pay | Admitting: Cardiovascular Disease

## 2017-07-26 NOTE — Telephone Encounter (Signed)
REFILL 

## 2017-08-18 DIAGNOSIS — H35371 Puckering of macula, right eye: Secondary | ICD-10-CM | POA: Diagnosis not present

## 2017-08-18 DIAGNOSIS — H26493 Other secondary cataract, bilateral: Secondary | ICD-10-CM | POA: Diagnosis not present

## 2017-08-18 DIAGNOSIS — H353131 Nonexudative age-related macular degeneration, bilateral, early dry stage: Secondary | ICD-10-CM | POA: Diagnosis not present

## 2017-08-18 DIAGNOSIS — Z961 Presence of intraocular lens: Secondary | ICD-10-CM | POA: Diagnosis not present

## 2017-08-24 ENCOUNTER — Encounter: Payer: Self-pay | Admitting: Cardiovascular Disease

## 2017-08-24 ENCOUNTER — Ambulatory Visit (INDEPENDENT_AMBULATORY_CARE_PROVIDER_SITE_OTHER): Payer: Medicare Other | Admitting: Cardiovascular Disease

## 2017-08-24 DIAGNOSIS — I10 Essential (primary) hypertension: Secondary | ICD-10-CM | POA: Diagnosis not present

## 2017-08-24 DIAGNOSIS — R9439 Abnormal result of other cardiovascular function study: Secondary | ICD-10-CM

## 2017-08-24 NOTE — Assessment & Plan Note (Signed)
History of essential hypertension the .She is on amlodipine and losartan. Continue current meds at current dosing.

## 2017-08-24 NOTE — Progress Notes (Signed)
08/24/2017 [employer]   08-Apr-1929  [id number]  Primary Physician Deanna Held, DO Primary Cardiologist: Deanna Harp MD Deanna Shields  HPI:  Deanna Shields is a 84 y.o.  mildly overweight, married Caucasian female with no children whose husband Deanna Shields was also a long-term patient of mine. Unfortunately, he had mesothelioma treated by Dr. Julien Shields secondary to remote asbestos exposure and was in hospice.[name] He expired October of last year of a ruptured abdominal aortic aneurysm. They were married for close to 60 years. I last saw her in the office 6 /1/18.[name]   She was remotely a patient of Dr. Alla Shields. She had a cath done by him Oct 23, 1999, after an abnormal Myoview done because of chest pain that showed minimal CAD.[name] Her risk factors at that time were family history and hyperlipidemia. She does not smoke or drink alcohol. She has been on statin drugs in the past.  Deanna Shields was admitted to the hospital 10/28/13 for 2 days because of chest pain/rule out myocardial function. A 2-D echo and Myoview stress test were normal. She was seen back a week later because of witnessed syncope. Over the ensuing months she's noticed jaw pain with associated with left shoulder arm back and chest pain with increasing dyspnea on exertion. A myoview stress test was performed that showed moderate inferoapical ischemia. A 2-D echo was essentially normal. She underwent outpatient diagnostic coronary arteriography by myself 09/16/14 which was essentially normal.   Since I saw her in the office a year ago she started remarkably well. Her husband Deanna Shields also is a patient of mine died 2 years ago and she seems to still the grieving.she was seen in the ER late last year with palpitations.  Which was performed 06/02/17 and an event monitor showed PVC   Current Meds  Medication Sig  . amLODipine (NORVASC) 2.5 MG tablet TAKE 1 TABLET DAILY  . Calcium Citrate (CITRACAL PO) Take 1 tablet by  mouth daily.  . chlorpheniramine-HYDROcodone (TUSSIONEX PENNKINETIC ER) 10-8 MG/5ML SUER Take 5 mLs by mouth every 12 (twelve) hours as needed for cough.  . losartan (COZAAR) 50 MG tablet TAKE 1 TABLET DAILY  . methocarbamol (ROBAXIN) 500 MG tablet Take 1 tablet (500 mg total) by mouth 4 (four) times daily as needed for muscle spasms.  . ondansetron (ZOFRAN ODT) 8 MG disintegrating tablet Take 1 tablet (8 mg total) by mouth every 8 (eight) hours as needed for nausea or vomiting.  . traZODone (DESYREL) 50 MG tablet TAKE 1/2-1 TABLET BY MOUTH EVERY NIGHT AT BEDTIME AS NEEDED FOR SLEEP     Allergies  Allergen Reactions  . Contrast Media [Iodinated Diagnostic Agents] Itching and Rash    Pt covered in rash, red.  . Codeine Nausea And Vomiting    in large doses: can tolerate Tussionex    Social History   Socioeconomic History  . Marital status: Married    Spouse name: Not on file  . Number of children: 0  . Years of education: Not on file  . Highest education level: Not on file  Social Needs  . Financial resource strain: Not on file  . Food insecurity - worry: Not on file  . Food insecurity - inability: Not on file  . Transportation needs - medical: Not on file  . Transportation needs - non-medical: Not on file  Occupational History  . 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 Smoking status: Never Smoker  . Smokeless tobacco: Never Used  Substance and Sexual Activity  . Alcohol use: No    Alcohol/week: 0.0 oz  . Drug use: No  . Sexual activity: No  Other Topics Concern  . Not on file  Social History Narrative   Patient doesn't get regular exercise   married     Review of Systems: General: negative for chills, fever, night sweats or weight changes.  Cardiovascular: negative for chest pain, dyspnea on exertion, edema, orthopnea, palpitations, paroxysmal nocturnal dyspnea or shortness of breath Dermatological: negative for  rash Respiratory: negative for cough or wheezing Urologic: negative for hematuria Abdominal: negative for nausea, vomiting, diarrhea, bright red blood per rectum, melena, or hematemesis Neurologic: negative for visual changes, syncope, or dizziness All other systems reviewed and are otherwise negative except as noted above.    Blood pressure (!) 153/76, pulse 83, height 5\' 6"  (1.676 m), weight 187 lb 3.2 oz (84.9 kg).  General appearance: alert and no distress Neck: no adenopathy, no carotid bruit, no JVD, supple, symmetrical, trachea midline and thyroid not enlarged, symmetric, no tenderness/mass/nodules Lungs: clear to auscultation bilaterally Heart: regular rate and rhythm, S1, S2 normal, no murmur, click, rub or gallop Extremities: extremities normal, atraumatic, no cyanosis or edema Pulses: 2+ and symmetric Skin: Skin color, texture, turgor normal. No rashes or lesions Neurologic: Alert and oriented X 3, normal strength and tone. Normal symmetric reflexes. Normal coordination and gait  EKG not performed today  ASSESSMENT AND PLAN:   Essential hypertension History of essential hypertension the .She is on amlodipine and losartan. Continue current meds at current dosing.  Abnormal nuclear stress test History of abnormal stress test in  The past which led to a cardiac  Catheterization by myself 09/16/14 which was essentially norm She denies chest pain or shortness .       Deanna Harp MD FACP,FACC,FAHA, [facility] 08/24/2017 11:33 AM

## 2017-08-24 NOTE — Patient Instructions (Signed)

## 2017-08-24 NOTE — Assessment & Plan Note (Signed)
History of abnormal stress test in  The past which led to a cardiac  Catheterization by myself 09/16/14 which was essentially norm She denies chest pain or shortness .

## 2017-08-29 ENCOUNTER — Other Ambulatory Visit: Payer: Self-pay | Admitting: Family Medicine

## 2017-08-29 DIAGNOSIS — R059 Cough, unspecified: Secondary | ICD-10-CM

## 2017-08-29 DIAGNOSIS — R05 Cough: Secondary | ICD-10-CM

## 2017-08-29 NOTE — Telephone Encounter (Signed)
Copied from [place] [id number]. Topic: Quick Communication - Rx Refill/Question >> Aug 29, 2017 11:06 AM Oliver Pila B wrote: Medication: chlorpheniramine-HYDROcodone ([place] ER) 10-8 MG/5ML Latanya Presser [[id number]]  Has the patient contacted their pharmacy? No. (Agent: If no, request that the patient contact the pharmacy for the refill.) Preferred Pharmacy (with phone number or street name): [facility] on [address] Agent: Please be advised that RX refills may take up to 3 business days. We ask that you follow-up with your pharmacy.

## 2017-08-29 NOTE — Addendum Note (Signed)
Addended by: Carlisle Beers on: 08/29/2017 02:36 PM   Modules accepted: Orders

## 2017-08-29 NOTE — Telephone Encounter (Signed)
Called pt and she stated that she is taking this for a dry hacking NP cough. She said she has been taking this for years and it helps.   Chlorpheniramine-Hydrocodone refill Last OV: 07/11/17 Last Refill:07/11/17 Pharmacy:[facility] on [address] PCP: Carollee Herter, Kendrick Fries DO

## 2017-08-30 MED ORDER — HYDROCOD POLST-CPM POLST ER 10-8 MG/5ML PO SUER: 5.0000 mL | Freq: Two times a day (BID) | ORAL | 0 refills | Status: DC | PRN

## 2017-10-11 ENCOUNTER — Other Ambulatory Visit: Payer: Self-pay | Admitting: Family Medicine

## 2017-10-11 DIAGNOSIS — R05 Cough: Secondary | ICD-10-CM

## 2017-10-11 DIAGNOSIS — R059 Cough, unspecified: Secondary | ICD-10-CM

## 2017-10-11 DIAGNOSIS — G4700 Insomnia, unspecified: Secondary | ICD-10-CM

## 2017-10-11 NOTE — Telephone Encounter (Signed)
Copied from [place]. Topic: Quick Communication - Rx Refill/Question >> Oct 11, 2017 12:36 PM Neva Seat wrote: chlorpheniramine-HYDROcodone Amanda Cockayne [facility]) 10-8 MG/5ML SURE traZODone (DESYREL) 50 MG tablet  Rx [place], [place] [address] Phone: [phone] Fax: [phone]

## 2017-10-12 NOTE — Telephone Encounter (Signed)
Pt requesting refill of Chlorpheniramine-hydrocodone(Tussionex), last filled on 08/30/17 and Trazodone 50mg , last filled on 07/06/17 #30  LOV: 07/11/17 Dr. Carollee Herter  [facility]

## 2017-10-12 NOTE — Telephone Encounter (Signed)
Pt calling to check status on her medications being refilled.

## 2017-10-13 MED ORDER — TRAZODONE HCL 50 MG PO TABS: ORAL_TABLET | ORAL | 0 refills | Status: DC

## 2017-10-13 MED ORDER — HYDROCOD POLST-CPM POLST ER 10-8 MG/5ML PO SUER: 5.0000 mL | Freq: Two times a day (BID) | ORAL | 0 refills | Status: DC | PRN

## 2017-10-14 NOTE — Telephone Encounter (Signed)
Author phoned pt. And made her aware of need to sign a CSC and leave a UDS before Dr. Etter Sjogren can refill her tussionex anymore. Pt. Compliant, and stated she did not use tussionex very often but since the pollen count has increased has been using it more. Author offered to make a lab appointment for her to address CSC and UDS but pt. Stated she would rather wait until her August appointment with Dr. Etter Sjogren to do that. Dr. Etter Sjogren made aware.

## 2017-11-10 ENCOUNTER — Ambulatory Visit (INDEPENDENT_AMBULATORY_CARE_PROVIDER_SITE_OTHER): Payer: Medicare Other | Admitting: Family Medicine

## 2017-11-10 ENCOUNTER — Encounter: Payer: Self-pay | Admitting: Family Medicine

## 2017-11-10 ENCOUNTER — Other Ambulatory Visit: Payer: Self-pay | Admitting: Family Medicine

## 2017-11-10 VITALS — BP 142/80 | HR 84 | Temp 98.2°F | Resp 16 | Ht 66.0 in | Wt 194.0 lb

## 2017-11-10 DIAGNOSIS — R110 Nausea: Secondary | ICD-10-CM

## 2017-11-10 DIAGNOSIS — R600 Localized edema: Secondary | ICD-10-CM | POA: Diagnosis not present

## 2017-11-10 DIAGNOSIS — E785 Hyperlipidemia, unspecified: Secondary | ICD-10-CM

## 2017-11-10 DIAGNOSIS — I10 Essential (primary) hypertension: Secondary | ICD-10-CM | POA: Diagnosis not present

## 2017-11-10 LAB — COMPREHENSIVE METABOLIC PANEL
ALK PHOS: 86 U/L (ref 39–117)
ALT: 15 U/L (ref 0–35)
AST: 21 U/L (ref 0–37)
Albumin: 4.2 g/dL (ref 3.5–5.2)
BUN: 17 mg/dL (ref 6–23)
CALCIUM: 9.7 mg/dL (ref 8.4–10.5)
CO2: 28 mEq/L (ref 19–32)
Chloride: 104 mEq/L (ref 96–112)
Creatinine, Ser: 1 mg/dL (ref 0.40–1.20)
GFR: 55.73 mL/min — ABNORMAL LOW (ref >60.00)
GLUCOSE: 110 mg/dL — AB (ref 70–99)
POTASSIUM: 4.8 meq/L (ref 3.5–5.1)
Sodium: 139 mEq/L (ref 135–145)
TOTAL PROTEIN: 6.8 g/dL (ref 6.0–8.3)
Total Bilirubin: 0.7 mg/dL (ref 0.2–1.2)

## 2017-11-10 LAB — LIPID PANEL
Cholesterol: 214 mg/dL — ABNORMAL HIGH (ref 0–200)
HDL: 48.4 mg/dL (ref >39.00)
LDL Cholesterol: 146 mg/dL — ABNORMAL HIGH (ref 0–99)
NONHDL: 165.89
Total CHOL/HDL Ratio: 4
Triglycerides: 101 mg/dL (ref 0.0–149.0)
VLDL: 20.2 mg/dL (ref 0.0–40.0)

## 2017-11-10 MED ORDER — ONDANSETRON 8 MG PO TBDP: 8.0000 mg | ORAL_TABLET | Freq: Three times a day (TID) | ORAL | 0 refills | Status: DC | PRN

## 2017-11-10 MED ORDER — HYDROCHLOROTHIAZIDE 25 MG PO TABS: 25.0000 mg | ORAL_TABLET | Freq: Every day | ORAL | 3 refills | Status: DC

## 2017-11-10 NOTE — Patient Instructions (Signed)
DASH Eating Plan DASH stands for "Dietary Approaches to Stop Hypertension." The DASH eating plan is a healthy eating plan that has been shown to reduce high blood pressure (hypertension). It may also reduce your risk for type 2 diabetes, heart disease, and stroke. The DASH eating plan may also help with weight loss. What are tips for following this plan? General guidelines  Avoid eating more than 2,300 mg (milligrams) of salt (sodium) a day. If you have hypertension, you may need to reduce your sodium intake to 1,500 mg a day.  Limit alcohol intake to no more than 1 drink a day for nonpregnant women and 2 drinks a day for men. One drink equals 12 oz of beer, 5 oz of wine, or 1 oz of hard liquor.  Work with your health care provider to maintain a healthy body weight or to lose weight. Ask what an ideal weight is for you.  Get at least 30 minutes of exercise that causes your heart to beat faster (aerobic exercise) most days of the week. Activities may include walking, swimming, or biking.  Work with your health care provider or diet and nutrition specialist (dietitian) to adjust your eating plan to your individual calorie needs. Reading food labels  Check food labels for the amount of sodium per serving. Choose foods with less than 5 percent of the Daily Value of sodium. Generally, foods with less than 300 mg of sodium per serving fit into this eating plan.  To find whole grains, look for the word "whole" as the first word in the ingredient list. Shopping  Buy products labeled as "low-sodium" or "no salt added."  Buy fresh foods. Avoid canned foods and premade or frozen meals. Cooking  Avoid adding salt when cooking. Use salt-free seasonings or herbs instead of table salt or sea salt. Check with your health care provider or pharmacist before using salt substitutes.  Do not fry foods. Cook foods using healthy methods such as baking, boiling, grilling, and broiling instead.  Cook with  heart-healthy oils, such as olive, canola, soybean, or sunflower oil. Meal planning   Eat a balanced diet that includes: ? 5 or more servings of fruits and vegetables each day. At each meal, try to fill half of your plate with fruits and vegetables. ? Up to 6-8 servings of whole grains each day. ? Less than 6 oz of lean meat, poultry, or fish each day. A 3-oz serving of meat is about the same size as a deck of cards. One egg equals 1 oz. ? 2 servings of low-fat dairy each day. ? A serving of nuts, seeds, or beans 5 times each week. ? Heart-healthy fats. Healthy fats called Omega-3 fatty acids are found in foods such as flaxseeds and coldwater fish, like sardines, salmon, and mackerel.  Limit how much you eat of the following: ? Canned or prepackaged foods. ? Food that is high in trans fat, such as fried foods. ? Food that is high in saturated fat, such as fatty meat. ? Sweets, desserts, sugary drinks, and other foods with added sugar. ? Full-fat dairy products.  Do not salt foods before eating.  Try to eat at least 2 vegetarian meals each week.  Eat more home-cooked food and less restaurant, buffet, and fast food.  When eating at a restaurant, ask that your food be prepared with less salt or no salt, if possible. What foods are recommended? The items listed may not be a complete list. Talk with your dietitian about what   dietary choices are best for you. Grains Whole-grain or whole-wheat bread. Whole-grain or whole-wheat pasta. Brown rice. Oatmeal. Quinoa. Bulgur. Whole-grain and low-sodium cereals. Pita bread. Low-fat, low-sodium crackers. Whole-wheat flour tortillas. Vegetables Fresh or frozen vegetables (raw, steamed, roasted, or grilled). Low-sodium or reduced-sodium tomato and vegetable juice. Low-sodium or reduced-sodium tomato sauce and tomato paste. Low-sodium or reduced-sodium canned vegetables. Fruits All fresh, dried, or frozen fruit. Canned fruit in natural juice (without  added sugar). Meat and other protein foods Skinless chicken or turkey. Ground chicken or turkey. Pork with fat trimmed off. Fish and seafood. Egg whites. Dried beans, peas, or lentils. Unsalted nuts, nut butters, and seeds. Unsalted canned beans. Lean cuts of beef with fat trimmed off. Low-sodium, lean deli meat. Dairy Low-fat (1%) or fat-free (skim) milk. Fat-free, low-fat, or reduced-fat cheeses. Nonfat, low-sodium ricotta or cottage cheese. Low-fat or nonfat yogurt. Low-fat, low-sodium cheese. Fats and oils Soft margarine without trans fats. Vegetable oil. Low-fat, reduced-fat, or light mayonnaise and salad dressings (reduced-sodium). Canola, safflower, olive, soybean, and sunflower oils. Avocado. Seasoning and other foods Herbs. Spices. Seasoning mixes without salt. Unsalted popcorn and pretzels. Fat-free sweets. What foods are not recommended? The items listed may not be a complete list. Talk with your dietitian about what dietary choices are best for you. Grains Baked goods made with fat, such as croissants, muffins, or some breads. Dry pasta or rice meal packs. Vegetables Creamed or fried vegetables. Vegetables in a cheese sauce. Regular canned vegetables (not low-sodium or reduced-sodium). Regular canned tomato sauce and paste (not low-sodium or reduced-sodium). Regular tomato and vegetable juice (not low-sodium or reduced-sodium). Pickles. Olives. Fruits Canned fruit in a light or heavy syrup. Fried fruit. Fruit in cream or butter sauce. Meat and other protein foods Fatty cuts of meat. Ribs. Fried meat. Bacon. Sausage. Bologna and other processed lunch meats. Salami. Fatback. Hotdogs. Bratwurst. Salted nuts and seeds. Canned beans with added salt. Canned or smoked fish. Whole eggs or egg yolks. Chicken or turkey with skin. Dairy Whole or 2% milk, cream, and half-and-half. Whole or full-fat cream cheese. Whole-fat or sweetened yogurt. Full-fat cheese. Nondairy creamers. Whipped toppings.  Processed cheese and cheese spreads. Fats and oils Butter. Stick margarine. Lard. Shortening. Ghee. Bacon fat. Tropical oils, such as coconut, palm kernel, or palm oil. Seasoning and other foods Salted popcorn and pretzels. Onion salt, garlic salt, seasoned salt, table salt, and sea salt. Worcestershire sauce. Tartar sauce. Barbecue sauce. Teriyaki sauce. Soy sauce, including reduced-sodium. Steak sauce. Canned and packaged gravies. Fish sauce. Oyster sauce. Cocktail sauce. Horseradish that you find on the shelf. Ketchup. Mustard. Meat flavorings and tenderizers. Bouillon cubes. Hot sauce and Tabasco sauce. Premade or packaged marinades. Premade or packaged taco seasonings. Relishes. Regular salad dressings. Where to find more information:  National Heart, Lung, and Blood Institute: www.nhlbi.nih.gov  American Heart Association: www.heart.org Summary  The DASH eating plan is a healthy eating plan that has been shown to reduce high blood pressure (hypertension). It may also reduce your risk for type 2 diabetes, heart disease, and stroke.  With the DASH eating plan, you should limit salt (sodium) intake to 2,300 mg a day. If you have hypertension, you may need to reduce your sodium intake to 1,500 mg a day.  When on the DASH eating plan, aim to eat more fresh fruits and vegetables, whole grains, lean proteins, low-fat dairy, and heart-healthy fats.  Work with your health care provider or diet and nutrition specialist (dietitian) to adjust your eating plan to your individual   calorie needs. This information is not intended to replace advice given to you by your health care provider. Make sure you discuss any questions you have with your health care provider. Document Released: 05/13/2011 Document Revised: 05/17/2016 Document Reviewed: 05/17/2016 Elsevier Interactive Patient Education  2018 Elsevier Inc.  

## 2017-11-10 NOTE — Assessment & Plan Note (Signed)
Well controlled, no changes to meds. Encouraged heart healthy diet such as the DASH diet and exercise as tolerated.  °

## 2017-11-10 NOTE — Assessment & Plan Note (Signed)
Tolerating statin, encouraged heart healthy diet, avoid trans fats, minimize simple carbs and saturated fats. Increase exercise as tolerated 

## 2017-11-10 NOTE — Progress Notes (Signed)
Patient ID: Deanna Shields, female   DOB: 08-02-1928, 84 y.o.   MRN: [id number]     Subjective:  I acted as a Education administrator for Dr. Carollee Herter.  Guerry Bruin, [place]   Patient ID: Deanna Shields, female    DOB: [birth year], 83 y.o.   MRN: [id number]  Chief Complaint  Patient presents with  . Back Pain  . Foreign Body    in left ear lobe    Back Pain  This is a new problem. Episode onset: 2 weeks ago. The problem occurs constantly. The pain is present in the lumbar spine. The quality of the pain is described as aching. The pain does not radiate. The symptoms are aggravated by standing. Pertinent negatives include no bladder incontinence, bowel incontinence, chest pain, dysuria, fever, headaches, leg pain, numbness, paresthesias, tingling or weakness. She has tried NSAIDs (ibuprofen) for the symptoms.    Patient is in today for back pain and foreign body in left ear lobe.   She is pretty sure the back of her earring is stuck in the back of her ear.  She is also here to f/u htn and cholesterol Patient Care Team: Carollee Herter, Alferd Apa, DO as PCP - General [name] Pearletha Forge, MD as Consulting Physician (Cardiology)   Past Medical History:  Diagnosis Date  . Arthritis    "right thumb" (01/29/2015)  . Chronic shoulder pain    "between my shoulders" (01/29/2015)  . Dizziness   . Esophageal stricture 2009  . Gallstones   . GERD (gastroesophageal reflux disease) 2004  . Hemorrhoids 2004  . Hiatal hernia 2004  . History of blood transfusion 1947   "appendix ruptured"  . HOH (hard of hearing)   . Hypertension   . Rhinitis, allergic   . Syncopal episodes     Past Surgical History:  Procedure Laterality Date  . APPENDECTOMY  1947   "ruptured"  . CARDIAC CATHETERIZATION  10/23/1999; 09/2014   EF 60% No significant CAD; patent coronary arteries.  . CARPAL TUNNEL RELEASE Right 1990's  . CHOLECYSTECTOMY N/A 01/29/2015   Procedure: LAPAROSCOPIC CHOLECYSTECTOMY;  Surgeon: Rolm Bookbinder, MD;   Location: [facility];  Service: General;  Laterality: N/A;  . DILATION AND CURETTAGE OF UTERUS  "couple"  . ESOPHAGOGASTRODUODENOSCOPY (EGD) WITH ESOPHAGEAL DILATION  2-3 times  . HEMORROIDECTOMY    . KNEE ARTHROSCOPY Bilateral    right x 2  . LAPAROSCOPIC CHOLECYSTECTOMY  01/29/2015  . LEFT HEART CATHETERIZATION WITH CORONARY ANGIOGRAM N/A 09/16/2014   Procedure: LEFT HEART CATHETERIZATION WITH CORONARY ANGIOGRAM;  Surgeon: [name], MD;  Location: [facility] CATH LAB;  Service: Cardiovascular;  Laterality: N/A;  . NM MYOVIEW LTD  06/01/2012   EF 71%  . VAGINAL HYSTERECTOMY  1970's    Family History  Problem Relation Age of Onset  . Stroke Father 65       light stroke  . Heart disease Sister   . Coronary artery disease Neg Hx   . Diabetes Neg Hx   . Cancer Neg Hx        breast, colon, prostate    Social History   Socioeconomic History  . Marital status: Married    Spouse name: Not on file  . Number of children: 0  . Years of education: Not on file  . Highest education level: Not on file  Occupational History  . Occupation: retired    Fish farm manager: RETIRED    Comment: school Music therapist  Social Needs  . Financial resource strain: Not on  file  . Food insecurity:    Worry: Not on file    Inability: Not on file  . Transportation needs:    Medical: Not on file    Non-medical: Not on file  Tobacco Use  . Smoking status: Never Smoker  . Smokeless tobacco: Never Used  Substance and Sexual Activity  . Alcohol use: No    Alcohol/week: 0.0 oz  . Drug use: No  . Sexual activity: Never  Lifestyle  . Physical activity:    Days per week: Not on file    Minutes per session: Not on file  . Stress: Not on file  Relationships  . Social connections:    Talks on phone: Not on file    Gets together: Not on file    Attends religious service: Not on file    Active member of club or organization: Not on file    Attends meetings of clubs or organizations: Not on file    Relationship  status: Not on file  . Intimate partner violence:    Fear of current or ex partner: Not on file    Emotionally abused: Not on file    Physically abused: Not on file    Forced sexual activity: Not on file  Other Topics Concern  . Not on file  Social History Narrative   Patient doesn't get regular exercise   married    Outpatient Medications Prior to Visit  Medication Sig Dispense Refill  . amLODipine (NORVASC) 2.5 MG tablet TAKE 1 TABLET DAILY 90 tablet 0  . chlorpheniramine-HYDROcodone (TUSSIONEX PENNKINETIC ER) 10-8 MG/5ML SUER Take 5 mLs by mouth every 12 (twelve) hours as needed for cough. 140 mL 0  . losartan (COZAAR) 50 MG tablet TAKE 1 TABLET DAILY 90 tablet 1  . traZODone (DESYREL) 50 MG tablet TAKE 1/2-1 TABLET BY MOUTH EVERY NIGHT AT BEDTIME AS NEEDED FOR SLEEP 30 tablet 0  . ondansetron (ZOFRAN ODT) 8 MG disintegrating tablet Take 1 tablet (8 mg total) by mouth every 8 (eight) hours as needed for nausea or vomiting. 10 tablet 0  . Calcium Citrate (CITRACAL PO) Take 1 tablet by mouth daily.    . methocarbamol (ROBAXIN) 500 MG tablet Take 1 tablet (500 mg total) by mouth 4 (four) times daily as needed for muscle spasms. 40 tablet 0   No facility-administered medications prior to visit.     Allergies  Allergen Reactions  . Contrast Media [Iodinated Diagnostic Agents] Itching and Rash    Pt covered in rash, red.  . Codeine Nausea And Vomiting    in large doses: can tolerate Tussionex    Review of Systems  Constitutional: Negative for fever and malaise/fatigue.  HENT: Negative for congestion.   Eyes: Negative for blurred vision.  Respiratory: Negative for cough and shortness of breath.   Cardiovascular: Negative for chest pain, palpitations and leg swelling.  Gastrointestinal: Negative for bowel incontinence and vomiting.  Genitourinary: Negative for bladder incontinence and dysuria.  Musculoskeletal: Positive for back pain.  Skin: Negative for rash.  Neurological:  Negative for tingling, loss of consciousness, weakness, numbness, headaches and paresthesias.       Objective:    Physical Exam  Constitutional: She is oriented to person, place, and time. She appears well-developed and well-nourished.  HENT:  Head: Normocephalic and atraumatic.  Ears:  Eyes: Conjunctivae and EOM are normal.  Neck: Normal range of motion. Neck supple. No JVD present. Carotid bruit is not present. No thyromegaly present.  Cardiovascular: Normal rate, regular  rhythm and normal heart sounds.  No murmur heard. Pulmonary/Chest: Effort normal and breath sounds normal. No respiratory distress. She has no wheezes. She has no rales. She exhibits no tenderness.  Musculoskeletal: She exhibits edema and tenderness.       Right ankle: She exhibits swelling.       Left ankle: She exhibits swelling.       Lumbar back: She exhibits tenderness and pain. She exhibits normal range of motion and no spasm.  Neurological: She is alert and oriented to person, place, and time.  Psychiatric: She has a normal mood and affect.  Nursing note and vitals reviewed.   BP (!) 142/80   Pulse 84   Temp 98.2 F (36.8 C) (Oral)   Resp 16   Ht 5\' 6"  (1.676 m)   Wt 194 lb (88 kg)   SpO2 96%   BMI 31.31 kg/m  Wt Readings from Last 3 Encounters:  11/10/17 194 lb (88 kg)  08/24/17 187 lb 3.2 oz (84.9 kg)  07/11/17 184 lb (83.5 kg)   BP Readings from Last 3 Encounters:  11/10/17 (!) 142/80  08/24/17 (!) 153/76  07/11/17 132/68     Immunization History  Administered Date(s) Administered  . Influenza Split 03/16/2011, 03/01/2012  . Influenza Whole 03/28/2007, 04/01/2009, 02/18/2010  . Influenza, High Dose Seasonal PF 03/14/2013  . Influenza,inj,Quad PF,6+ Mos 03/20/2014, 03/11/2015  . Pneumococcal Conjugate-13 02/25/2015  . Pneumococcal Polysaccharide-23 03/28/2007  . Td 12/20/2005    Health Maintenance  Topic Date Due  . MAMMOGRAM  09/15/2013  . TETANUS/TDAP  12/21/2015  .  INFLUENZA VACCINE  01/05/2018  . DEXA SCAN  Completed  . PNA vac Low Risk Adult  Completed    Lab Results  Component Value Date   WBC 8.7 07/11/2017   HGB 13.3 07/11/2017   HCT 41.5 07/11/2017   PLT 264.0 07/11/2017   GLUCOSE 110 (H) 11/10/2017   CHOL 214 (H) 11/10/2017   TRIG 101.0 11/10/2017   HDL 48.40 11/10/2017   LDLDIRECT 161.2 07/18/2013   LDLCALC 146 (H) 11/10/2017   ALT 15 11/10/2017   AST 21 11/10/2017   NA 139 11/10/2017   K 4.8 11/10/2017   CL 104 11/10/2017   CREATININE 1.00 11/10/2017   BUN 17 11/10/2017   CO2 28 11/10/2017   TSH 2.780 10/28/2013   INR 0.99 09/09/2014   HGBA1C 5.8 07/11/2017    Lab Results  Component Value Date   TSH 2.780 10/28/2013   Lab Results  Component Value Date   WBC 8.7 07/11/2017   HGB 13.3 07/11/2017   HCT 41.5 07/11/2017   MCV 88.4 07/11/2017   PLT 264.0 07/11/2017   Lab Results  Component Value Date   NA 139 11/10/2017   K 4.8 11/10/2017   CO2 28 11/10/2017   GLUCOSE 110 (H) 11/10/2017   BUN 17 11/10/2017   CREATININE 1.00 11/10/2017   BILITOT 0.7 11/10/2017   ALKPHOS 86 11/10/2017   AST 21 11/10/2017   ALT 15 11/10/2017   PROT 6.8 11/10/2017   ALBUMIN 4.2 11/10/2017   CALCIUM 9.7 11/10/2017   ANIONGAP 7 06/26/2017   GFR 55.73 (L) 11/10/2017   Lab Results  Component Value Date   CHOL 214 (H) 11/10/2017   Lab Results  Component Value Date   HDL 48.40 11/10/2017   Lab Results  Component Value Date   LDLCALC 146 (H) 11/10/2017   Lab Results  Component Value Date   TRIG 101.0 11/10/2017   Lab Results  Component Value Date   CHOLHDL 4 11/10/2017   Lab Results  Component Value Date   HGBA1C 5.8 07/11/2017         Assessment & Plan:   Problem List Items Addressed This Visit      Unprioritized   Dyslipidemia    Tolerating statin, encouraged heart healthy diet, avoid trans fats, minimize simple carbs and saturated fats. Increase exercise as tolerated      Essential hypertension - Primary  (Chronic)    Well controlled, no changes to meds. Encouraged heart healthy diet such as the DASH diet and exercise as tolerated.       Relevant Medications   hydrochlorothiazide (HYDRODIURIL) 25 MG tablet   Other Relevant Orders   Lipid panel (Completed)   Comprehensive metabolic panel (Completed)    Other Visit Diagnoses    Nausea       Relevant Medications   ondansetron (ZOFRAN ODT) 8 MG disintegrating tablet   Lower extremity edema          I have discontinued [facility]'s Calcium Citrate (CITRACAL PO) and methocarbamol. I am also having her start on hydrochlorothiazide. Additionally, I am having her maintain her losartan, amLODipine, chlorpheniramine-HYDROcodone, traZODone, and ondansetron.  Meds ordered this encounter  Medications  . ondansetron (ZOFRAN ODT) 8 MG disintegrating tablet    Sig: Take 1 tablet (8 mg total) by mouth every 8 (eight) hours as needed for nausea or vomiting.    Dispense:  10 tablet    Refill:  0  . hydrochlorothiazide (HYDRODIURIL) 25 MG tablet    Sig: Take 1 tablet (25 mg total) by mouth daily.    Dispense:  90 tablet    Refill:  3    CMA served as scribe during this visit. History, Physical and Plan performed by medical provider. Documentation and orders reviewed and attested to.  [name], DO

## 2017-11-15 ENCOUNTER — Telehealth: Payer: Self-pay | Admitting: Family Medicine

## 2017-11-15 NOTE — Telephone Encounter (Signed)
Results read to pt via Results Review

## 2017-11-15 NOTE — Telephone Encounter (Signed)
Copied from [place] [phone]. Topic: Quick Communication - Lab Results >> Nov 14, 2017  6:06 PM Ewing, Donell Sievert, CMA wrote: Called patient to inform them of 11/14/2017 lab results. When patient returns call, triage nurse may disclose results.   Pt calling back for lab results

## 2017-12-01 ENCOUNTER — Telehealth: Payer: Self-pay | Admitting: *Deleted

## 2017-12-01 ENCOUNTER — Encounter: Payer: Self-pay | Admitting: Family Medicine

## 2017-12-01 ENCOUNTER — Ambulatory Visit (INDEPENDENT_AMBULATORY_CARE_PROVIDER_SITE_OTHER): Payer: Medicare Other | Admitting: Family Medicine

## 2017-12-01 VITALS — BP 138/70 | HR 74 | Temp 98.3°F | Resp 16 | Ht 66.0 in | Wt 192.4 lb

## 2017-12-01 DIAGNOSIS — R296 Repeated falls: Secondary | ICD-10-CM | POA: Insufficient documentation

## 2017-12-01 DIAGNOSIS — R059 Cough, unspecified: Secondary | ICD-10-CM

## 2017-12-01 DIAGNOSIS — R05 Cough: Secondary | ICD-10-CM

## 2017-12-01 DIAGNOSIS — R600 Localized edema: Secondary | ICD-10-CM | POA: Diagnosis not present

## 2017-12-01 MED ORDER — HYDROCOD POLST-CPM POLST ER 10-8 MG/5ML PO SUER: 5.0000 mL | Freq: Two times a day (BID) | ORAL | 0 refills | Status: DC | PRN

## 2017-12-01 NOTE — Telephone Encounter (Signed)
Copied from [place] [phone]. Topic: Inquiry >> Dec 01, 2017 11:14 AM Oliver Pila B wrote: Reason for CRM: [facility] called b/c they are needing orders PT contact [phone]

## 2017-12-01 NOTE — Assessment & Plan Note (Signed)
She did not get dizzy Her leg did not give out No cp or sob She states she had not taken the tussionex that day Warm compress on R side where bruise is--- healing well Physical therapy referral put in for eval It was really the first time in a long time this has happened rto as reg scheduled

## 2017-12-01 NOTE — Assessment & Plan Note (Signed)
She has had pulm w/u in past and tried mult meds

## 2017-12-01 NOTE — Progress Notes (Signed)
Check labs.  Adjust meds prnCheck labs.  Adjust meds prnCheck labs.  Adjust meds prn Patient ID: Deanna Shields, female    DOB: 13-Apr-1929  Age: 84 y.o. MRN: [id number]    Subjective:  Subjective  HPI Deanna Shields presents for fall on Saturday.  Pt does not know why she fell and she had to call for help.  [facility] and a nurse came and helped her up and took her vitals and she was ok.  She is bruised on her R side.  She is sore but no other complaints.   No loc, no heady injury. No cp , sob or palpitations.   Vs were good per pt.   This has not happened before.     Review of Systems  Constitutional: Negative for appetite change, diaphoresis, fatigue and unexpected weight change.  Eyes: Negative for pain, redness and visual disturbance.  Respiratory: Negative for cough, chest tightness, shortness of breath and wheezing.   Cardiovascular: Negative for chest pain, palpitations and leg swelling.  Endocrine: Negative for cold intolerance, heat intolerance, polydipsia, polyphagia and polyuria.  Genitourinary: Negative for difficulty urinating, dysuria and frequency.  Neurological: Positive for weakness. Negative for dizziness, light-headedness, numbness and headaches.    History Past Medical History:  Diagnosis Date  . Arthritis    "right thumb" (01/29/2015)  . Chronic shoulder pain    "between my shoulders" (01/29/2015)  . Dizziness   . Esophageal stricture 2009  . Gallstones   . GERD (gastroesophageal reflux disease) 2004  . Hemorrhoids 2004  . Hiatal hernia 2004  . History of blood transfusion 1947   "appendix ruptured"  . HOH (hard of hearing)   . Hypertension   . Rhinitis, allergic   . Syncopal episodes     She has a past surgical history that includes Vaginal hysterectomy (1970's); Hemorroidectomy; Knee arthroscopy (Bilateral); Carpal tunnel release (Right, 1990's); NM MYOVIEW LTD (06/01/2012); left heart catheterization with coronary angiogram (N/A,  09/16/2014); Appendectomy (1947); Laparoscopic cholecystectomy (01/29/2015); Dilation and curettage of uterus ("couple"); Cardiac catheterization (10/23/1999; 09/2014); Esophagogastroduodenoscopy (egd) with esophageal dilation (2-3 times); and Cholecystectomy (N/A, 01/29/2015).   Her family history includes Heart disease in her sister; Stroke (age of onset: 60) in her father.She reports that she has never smoked. She has never used smokeless tobacco. She reports that she does not drink alcohol or use drugs.  Current Outpatient Medications on File Prior to Visit  Medication Sig Dispense Refill  . amLODipine (NORVASC) 2.5 MG tablet TAKE 1 TABLET DAILY 90 tablet 0  . hydrochlorothiazide (HYDRODIURIL) 25 MG tablet Take 1 tablet (25 mg total) by mouth daily. 90 tablet 3  . losartan (COZAAR) 50 MG tablet TAKE 1 TABLET DAILY 90 tablet 1  . ondansetron (ZOFRAN ODT) 8 MG disintegrating tablet Take 1 tablet (8 mg total) by mouth every 8 (eight) hours as needed for nausea or vomiting. 10 tablet 0  . traZODone (DESYREL) 50 MG tablet TAKE 1/2-1 TABLET BY MOUTH EVERY NIGHT AT BEDTIME AS NEEDED FOR SLEEP 30 tablet 0   No current facility-administered medications on file prior to visit.      Objective:  Objective  Physical Exam  Constitutional: She is oriented to person, place, and time. She appears well-developed and well-nourished.  HENT:  Head: Normocephalic and atraumatic.  Eyes: Conjunctivae and EOM are normal.  Neck: Normal range of motion. Neck supple. No JVD present. Carotid bruit is not present. No thyromegaly present.  Cardiovascular: Normal rate, regular rhythm and  normal heart sounds.  No murmur heard. Pulmonary/Chest: Effort normal and breath sounds normal. No respiratory distress. She has no wheezes. She has no rales. She exhibits no tenderness.  Musculoskeletal: She exhibits no edema or tenderness.  Neurological: She is alert and oriented to person, place, and time. She displays normal reflexes.  No cranial nerve deficit or sensory deficit. She exhibits normal muscle tone. Coordination normal.  Psychiatric: She has a normal mood and affect.  Nursing note and vitals reviewed.  BP 138/70 (BP Location: Right Arm, Cuff Size: Normal)   Pulse 74   Temp 98.3 F (36.8 C) (Oral)   Resp 16   Ht 5\' 6"  (1.676 m)   Wt 192 lb 6.4 oz (87.3 kg)   SpO2 96%   BMI 31.05 kg/m  Wt Readings from Last 3 Encounters:  12/01/17 192 lb 6.4 oz (87.3 kg)  11/10/17 194 lb (88 kg)  08/24/17 187 lb 3.2 oz (84.9 kg)     Lab Results  Component Value Date   WBC 8.7 07/11/2017   HGB 13.3 07/11/2017   HCT 41.5 07/11/2017   PLT 264.0 07/11/2017   GLUCOSE 110 (H) 11/10/2017   CHOL 214 (H) 11/10/2017   TRIG 101.0 11/10/2017   HDL 48.40 11/10/2017   LDLDIRECT 161.2 07/18/2013   LDLCALC 146 (H) 11/10/2017   ALT 15 11/10/2017   AST 21 11/10/2017   NA 139 11/10/2017   K 4.8 11/10/2017   CL 104 11/10/2017   CREATININE 1.00 11/10/2017   BUN 17 11/10/2017   CO2 28 11/10/2017   TSH 2.780 10/28/2013   INR 0.99 09/09/2014   HGBA1C 5.8 07/11/2017    No results found.   Assessment & Plan:  Plan  I am having Deanna D. Wilmot maintain her losartan, amLODipine, traZODone, ondansetron, hydrochlorothiazide, and chlorpheniramine-HYDROcodone.  Meds ordered this encounter  Medications  . chlorpheniramine-HYDROcodone (TUSSIONEX PENNKINETIC ER) 10-8 MG/5ML SUER    Sig: Take 5 mLs by mouth every 12 (twelve) hours as needed for cough.    Dispense:  140 mL    Refill:  0    Problem List Items Addressed This Visit      Unprioritized   COUGH, CHRONIC    She has had pulm w/u in past and tried mult meds        Relevant Medications   chlorpheniramine-HYDROcodone (TUSSIONEX PENNKINETIC ER) 10-8 MG/5ML [place] frequently - Primary    She did not get dizzy Her leg did not give out No cp or sob She states she had not taken the tussionex that day Warm compress on R side where bruise is--- healing  well Physical therapy referral put in for eval It was really the first time in a long time this has happened rto as reg scheduled      Relevant Orders   Ambulatory referral to Physical Therapy    Other Visit Diagnoses    Lower extremity edema          Follow-up: No follow-ups on file.  [name], DO

## 2017-12-01 NOTE — Patient Instructions (Signed)

## 2017-12-02 NOTE — Telephone Encounter (Signed)
Ok to order pt-- order is in epic

## 2017-12-02 NOTE — Telephone Encounter (Signed)
Request for PT orders per [facility]; routed to Dr. Etter Sjogren for review.

## 2017-12-05 NOTE — Telephone Encounter (Signed)
Author phoned PT # given [phone], but no answer. Will try again later.

## 2017-12-05 NOTE — Telephone Encounter (Signed)
Referral faxed over by referral coordinator.

## 2017-12-21 DIAGNOSIS — R05 Cough: Secondary | ICD-10-CM | POA: Diagnosis not present

## 2017-12-21 DIAGNOSIS — J019 Acute sinusitis, unspecified: Secondary | ICD-10-CM | POA: Diagnosis not present

## 2017-12-21 DIAGNOSIS — R296 Repeated falls: Secondary | ICD-10-CM | POA: Diagnosis not present

## 2017-12-21 DIAGNOSIS — K440 Diaphragmatic hernia with obstruction, without gangrene: Secondary | ICD-10-CM | POA: Diagnosis not present

## 2017-12-21 DIAGNOSIS — M549 Dorsalgia, unspecified: Secondary | ICD-10-CM | POA: Diagnosis not present

## 2017-12-21 DIAGNOSIS — M6281 Muscle weakness (generalized): Secondary | ICD-10-CM | POA: Diagnosis not present

## 2017-12-21 DIAGNOSIS — M949 Disorder of cartilage, unspecified: Secondary | ICD-10-CM | POA: Diagnosis not present

## 2017-12-21 DIAGNOSIS — R609 Edema, unspecified: Secondary | ICD-10-CM | POA: Diagnosis not present

## 2017-12-21 DIAGNOSIS — R2681 Unsteadiness on feet: Secondary | ICD-10-CM | POA: Diagnosis not present

## 2017-12-26 DIAGNOSIS — M949 Disorder of cartilage, unspecified: Secondary | ICD-10-CM | POA: Diagnosis not present

## 2017-12-26 DIAGNOSIS — R2681 Unsteadiness on feet: Secondary | ICD-10-CM | POA: Diagnosis not present

## 2017-12-26 DIAGNOSIS — M6281 Muscle weakness (generalized): Secondary | ICD-10-CM | POA: Diagnosis not present

## 2017-12-26 DIAGNOSIS — R296 Repeated falls: Secondary | ICD-10-CM | POA: Diagnosis not present

## 2017-12-26 DIAGNOSIS — R609 Edema, unspecified: Secondary | ICD-10-CM | POA: Diagnosis not present

## 2017-12-26 DIAGNOSIS — K440 Diaphragmatic hernia with obstruction, without gangrene: Secondary | ICD-10-CM | POA: Diagnosis not present

## 2017-12-27 ENCOUNTER — Telehealth: Payer: Self-pay | Admitting: *Deleted

## 2017-12-27 NOTE — Telephone Encounter (Signed)
Received Physician Orders/PT Plan of Care from [facility]; forwarded to provider/SLS 07/23

## 2017-12-28 DIAGNOSIS — K440 Diaphragmatic hernia with obstruction, without gangrene: Secondary | ICD-10-CM | POA: Diagnosis not present

## 2017-12-28 DIAGNOSIS — R609 Edema, unspecified: Secondary | ICD-10-CM | POA: Diagnosis not present

## 2017-12-28 DIAGNOSIS — R296 Repeated falls: Secondary | ICD-10-CM | POA: Diagnosis not present

## 2017-12-28 DIAGNOSIS — M6281 Muscle weakness (generalized): Secondary | ICD-10-CM | POA: Diagnosis not present

## 2017-12-28 DIAGNOSIS — R2681 Unsteadiness on feet: Secondary | ICD-10-CM | POA: Diagnosis not present

## 2017-12-28 DIAGNOSIS — M949 Disorder of cartilage, unspecified: Secondary | ICD-10-CM | POA: Diagnosis not present

## 2018-01-04 DIAGNOSIS — M949 Disorder of cartilage, unspecified: Secondary | ICD-10-CM | POA: Diagnosis not present

## 2018-01-04 DIAGNOSIS — R2681 Unsteadiness on feet: Secondary | ICD-10-CM | POA: Diagnosis not present

## 2018-01-04 DIAGNOSIS — R296 Repeated falls: Secondary | ICD-10-CM | POA: Diagnosis not present

## 2018-01-04 DIAGNOSIS — M6281 Muscle weakness (generalized): Secondary | ICD-10-CM | POA: Diagnosis not present

## 2018-01-04 DIAGNOSIS — R609 Edema, unspecified: Secondary | ICD-10-CM | POA: Diagnosis not present

## 2018-01-04 DIAGNOSIS — K440 Diaphragmatic hernia with obstruction, without gangrene: Secondary | ICD-10-CM | POA: Diagnosis not present

## 2018-01-05 ENCOUNTER — Telehealth: Payer: Self-pay | Admitting: Family Medicine

## 2018-01-05 DIAGNOSIS — R296 Repeated falls: Secondary | ICD-10-CM | POA: Diagnosis not present

## 2018-01-05 DIAGNOSIS — R2681 Unsteadiness on feet: Secondary | ICD-10-CM | POA: Diagnosis not present

## 2018-01-05 DIAGNOSIS — M6281 Muscle weakness (generalized): Secondary | ICD-10-CM | POA: Diagnosis not present

## 2018-01-05 NOTE — Telephone Encounter (Signed)
Copied from [place] [id number]. Topic: Quick Communication - Rx Refill/Question >> Jan 05, 2018  8:57 AM Antonieta Iba C wrote: Medication: chlorpheniramine-HYDROcodone (TUSSIONEX PENNKINETIC ER) 10-8 MG/5ML SUER  and also traZODone (DESYREL) 50 MG tablet   Has the patient contacted their pharmacy? No   (Agent: If no, request that the patient contact the pharmacy for the refill.) (Agent: If yes, when and what did the pharmacy advise?)  Preferred Pharmacy (with phone number or street name): [place], [place].  Agent: Please be advised that RX refills may take up to 3 business days. We ask that you follow-up with your pharmacy.

## 2018-01-05 NOTE — Telephone Encounter (Signed)
Refill request:  Chlorpheniramine-Hydrocodone; LRF 12/01/17; # 140; no refills  Trazodone;LRF 10/13/17; #30; no refills  Last office visit: 12/01/17  PCP: Dr. Carollee Herter  Pharmacy: [facility]; [place]

## 2018-01-06 ENCOUNTER — Other Ambulatory Visit: Payer: Self-pay | Admitting: Family Medicine

## 2018-01-06 DIAGNOSIS — R059 Cough, unspecified: Secondary | ICD-10-CM

## 2018-01-06 DIAGNOSIS — R05 Cough: Secondary | ICD-10-CM

## 2018-01-06 MED ORDER — HYDROCOD POLST-CPM POLST ER 10-8 MG/5ML PO SUER: 5.0000 mL | Freq: Two times a day (BID) | ORAL | 0 refills | Status: DC | PRN

## 2018-01-06 NOTE — Telephone Encounter (Signed)
done

## 2018-01-09 ENCOUNTER — Other Ambulatory Visit: Payer: Self-pay | Admitting: Family Medicine

## 2018-01-09 ENCOUNTER — Ambulatory Visit: Payer: Medicare Other | Admitting: Family Medicine

## 2018-01-09 DIAGNOSIS — R2681 Unsteadiness on feet: Secondary | ICD-10-CM | POA: Diagnosis not present

## 2018-01-09 DIAGNOSIS — R296 Repeated falls: Secondary | ICD-10-CM | POA: Diagnosis not present

## 2018-01-09 DIAGNOSIS — M6281 Muscle weakness (generalized): Secondary | ICD-10-CM | POA: Diagnosis not present

## 2018-01-09 DIAGNOSIS — G4700 Insomnia, unspecified: Secondary | ICD-10-CM

## 2018-01-09 NOTE — Telephone Encounter (Signed)
Pt called in - tussinex was sent to [facility] but they do not have RX for Trazadone. Please send Trazadone and notify pt when sent.

## 2018-01-09 NOTE — Telephone Encounter (Signed)
Patient notified that rx was sent in  

## 2018-01-10 ENCOUNTER — Ambulatory Visit (HOSPITAL_BASED_OUTPATIENT_CLINIC_OR_DEPARTMENT_OTHER)
Admission: RE | Admit: 2018-01-10 | Discharge: 2018-01-10 | Disposition: A | Discharge status: Home / Self Care | Payer: Medicare Other | Source: Ambulatory Visit | Attending: Family Medicine | Admitting: Family Medicine

## 2018-01-10 ENCOUNTER — Ambulatory Visit (INDEPENDENT_AMBULATORY_CARE_PROVIDER_SITE_OTHER): Payer: Medicare Other | Admitting: Family Medicine

## 2018-01-10 ENCOUNTER — Encounter: Payer: Self-pay | Admitting: Family Medicine

## 2018-01-10 VITALS — BP 158/68 | HR 84 | Temp 98.1°F | Resp 16 | Ht 66.0 in | Wt 192.4 lb

## 2018-01-10 DIAGNOSIS — E785 Hyperlipidemia, unspecified: Secondary | ICD-10-CM | POA: Diagnosis not present

## 2018-01-10 DIAGNOSIS — M545 Low back pain: Secondary | ICD-10-CM

## 2018-01-10 DIAGNOSIS — R05 Cough: Secondary | ICD-10-CM | POA: Diagnosis not present

## 2018-01-10 DIAGNOSIS — I700 Atherosclerosis of aorta: Secondary | ICD-10-CM | POA: Insufficient documentation

## 2018-01-10 DIAGNOSIS — R053 Chronic cough: Secondary | ICD-10-CM

## 2018-01-10 DIAGNOSIS — G8929 Other chronic pain: Secondary | ICD-10-CM

## 2018-01-10 DIAGNOSIS — Z79899 Other long term (current) drug therapy: Secondary | ICD-10-CM | POA: Diagnosis not present

## 2018-01-10 DIAGNOSIS — I10 Essential (primary) hypertension: Secondary | ICD-10-CM

## 2018-01-10 DIAGNOSIS — M5136 Other intervertebral disc degeneration, lumbar region: Secondary | ICD-10-CM | POA: Insufficient documentation

## 2018-01-10 MED ORDER — LORATADINE 10 MG PO TABS: 10.0000 mg | ORAL_TABLET | Freq: Every day | ORAL | 11 refills | Status: DC

## 2018-01-10 NOTE — Progress Notes (Signed)
Patient ID: Deanna Shields, female    DOB: Mar 02, 1929  Age: 84 y.o. MRN: [id number]    Subjective:  Subjective  HPI [place] presents for f/u -- c/o cough she has had now for almost 10 years.  She has had tussionex regularly since.   It started after having pneumonia.  She has seen pulmonary,  Gi -- tx for reflux and asthma with not much relief.  Pt is willing to see someone else for help so we can try to get her off the tussionex .  Pt never f/u with pulm ---she is not sure why but would be willing to go back if it was in [place].    Review of Systems  Constitutional: Negative for appetite change, diaphoresis, fatigue and unexpected weight change.  Eyes: Negative for pain, redness and visual disturbance.  Respiratory: Positive for cough. Negative for chest tightness, shortness of breath and wheezing.   Cardiovascular: Negative for chest pain, palpitations and leg swelling.  Endocrine: Negative for cold intolerance, heat intolerance, polydipsia, polyphagia and polyuria.  Genitourinary: Negative for difficulty urinating, dysuria and frequency.  Neurological: Negative for dizziness, light-headedness, numbness and headaches.    History Past Medical History:  Diagnosis Date  . Arthritis    "right thumb" (01/29/2015)  . Chronic shoulder pain    "between my shoulders" (01/29/2015)  . Dizziness   . Esophageal stricture 2009  . Gallstones   . GERD (gastroesophageal reflux disease) 2004  . Hemorrhoids 2004  . Hiatal hernia 2004  . History of blood transfusion 1947   "appendix ruptured"  . HOH (hard of hearing)   . Hypertension   . Rhinitis, allergic   . Syncopal episodes     She has a past surgical history that includes Vaginal hysterectomy (1970's); Hemorroidectomy; Knee arthroscopy (Bilateral); Carpal tunnel release (Right, 1990's); NM MYOVIEW LTD (06/01/2012); left heart catheterization with coronary angiogram (N/A, 09/16/2014); Appendectomy (1947); Laparoscopic cholecystectomy  (01/29/2015); Dilation and curettage of uterus ("couple"); Cardiac catheterization (10/23/1999; 09/2014); Esophagogastroduodenoscopy (egd) with esophageal dilation (2-3 times); and Cholecystectomy (N/A, 01/29/2015).   Her family history includes Heart disease in her sister; Stroke (age of onset: 1) in her father.She reports that she has never smoked. She has never used smokeless tobacco. She reports that she does not drink alcohol or use drugs.  Current Outpatient Medications on File Prior to Visit  Medication Sig Dispense Refill  . amLODipine (NORVASC) 2.5 MG tablet TAKE 1 TABLET DAILY 90 tablet 0  . chlorpheniramine-HYDROcodone (TUSSIONEX PENNKINETIC ER) 10-8 MG/5ML SUER Take 5 mLs by mouth every 12 (twelve) hours as needed for cough. 140 mL 0  . hydrochlorothiazide (HYDRODIURIL) 25 MG tablet Take 1 tablet (25 mg total) by mouth daily. 90 tablet 3  . losartan (COZAAR) 50 MG tablet TAKE 1 TABLET DAILY 90 tablet 1  . ondansetron (ZOFRAN ODT) 8 MG disintegrating tablet Take 1 tablet (8 mg total) by mouth every 8 (eight) hours as needed for nausea or vomiting. 10 tablet 0  . traZODone (DESYREL) 50 MG tablet TAKE 1/2 TO 1 TABLET AT BEDTIME AS NEEDED FOR SLEEP. 30 tablet 1   No current facility-administered medications on file prior to visit.      Objective:  Objective  Physical Exam  Constitutional: She is oriented to person, place, and time. She appears well-developed and well-nourished.  HENT:  Head: Normocephalic and atraumatic.  Eyes: Conjunctivae and EOM are normal.  Neck: Normal range of motion. Neck supple. No JVD present. Carotid bruit is not present. No  thyromegaly present.  Cardiovascular: Normal rate, regular rhythm and normal heart sounds.  No murmur heard. Pulmonary/Chest: Effort normal and breath sounds normal. No respiratory distress. She has no wheezes. She has no rales. She exhibits no tenderness.  Musculoskeletal: She exhibits no edema.  Neurological: She is alert and oriented  to person, place, and time.  Psychiatric: She has a normal mood and affect.  Nursing note and vitals reviewed.  BP (!) 158/68 (BP Location: Right Arm, Cuff Size: Large)   Pulse 84   Temp 98.1 F (36.7 C) (Oral)   Resp 16   Ht 5\' 6"  (1.676 m)   Wt 192 lb 6.4 oz (87.3 kg)   SpO2 97%   BMI 31.05 kg/m  Wt Readings from Last 3 Encounters:  01/10/18 192 lb 6.4 oz (87.3 kg)  12/01/17 192 lb 6.4 oz (87.3 kg)  11/10/17 194 lb (88 kg)     Lab Results  Component Value Date   WBC 8.7 07/11/2017   HGB 13.3 07/11/2017   HCT 41.5 07/11/2017   PLT 264.0 07/11/2017   GLUCOSE 110 (H) 11/10/2017   CHOL 214 (H) 11/10/2017   TRIG 101.0 11/10/2017   HDL 48.40 11/10/2017   LDLDIRECT 161.2 07/18/2013   LDLCALC 146 (H) 11/10/2017   ALT 15 11/10/2017   AST 21 11/10/2017   NA 139 11/10/2017   K 4.8 11/10/2017   CL 104 11/10/2017   CREATININE 1.00 11/10/2017   BUN 17 11/10/2017   CO2 28 11/10/2017   TSH 2.780 10/28/2013   INR 0.99 09/09/2014   HGBA1C 5.8 07/11/2017    No results found.   Assessment & Plan:  Plan  I am having Deanna Shields start on loratadine. I am also having her maintain her losartan, amLODipine, ondansetron, hydrochlorothiazide, chlorpheniramine-HYDROcodone, and traZODone.  Meds ordered this encounter  Medications  . loratadine (CLARITIN) 10 MG tablet    Sig: Take 1 tablet (10 mg total) by mouth daily.    Dispense:  30 tablet    Refill:  11    Problem List Items Addressed This Visit      Unprioritized   Dyslipidemia    Encouraged heart healthy diet, increase exercise, avoid trans fats, consider a krill oil cap daily      Essential hypertension (Chronic)    Well controlled, no changes to meds. Encouraged heart healthy diet such as the DASH diet and exercise as tolerated.        Other Visit Diagnoses    Chronic cough    -  Primary   Relevant Medications   loratadine (CLARITIN) 10 MG tablet   Other Relevant Orders   Pain Mgmt, Profile 8 w/Conf, U     Ambulatory referral to Pulmonology   High risk medication use       Relevant Orders   Pain Mgmt, Profile 8 w/Conf, U   Chronic midline low back pain without sciatica       Relevant Orders   DG Lumbar Spine Complete (Completed)      Follow-up: No follow-ups on file.  [name], DO

## 2018-01-10 NOTE — Patient Instructions (Signed)

## 2018-01-11 DIAGNOSIS — M6281 Muscle weakness (generalized): Secondary | ICD-10-CM | POA: Diagnosis not present

## 2018-01-11 DIAGNOSIS — R2681 Unsteadiness on feet: Secondary | ICD-10-CM | POA: Diagnosis not present

## 2018-01-11 DIAGNOSIS — R296 Repeated falls: Secondary | ICD-10-CM | POA: Diagnosis not present

## 2018-01-12 NOTE — Assessment & Plan Note (Signed)
Well controlled, no changes to meds. Encouraged heart healthy diet such as the DASH diet and exercise as tolerated.  °

## 2018-01-12 NOTE — Assessment & Plan Note (Signed)
Encouraged heart healthy diet, increase exercise, avoid trans fats, consider a krill oil cap daily 

## 2018-01-14 LAB — PAIN MGMT, PROFILE 8 W/CONF, U
6 ACETYLMORPHINE: NEGATIVE ng/mL (ref <10)
AMPHETAMINES: NEGATIVE ng/mL (ref <500)
Alcohol Metabolites: NEGATIVE ng/mL (ref <500)
BENZODIAZEPINES: NEGATIVE ng/mL (ref <100)
Buprenorphine, Urine: NEGATIVE ng/mL (ref <5)
COCAINE METABOLITE: NEGATIVE ng/mL (ref <150)
Codeine: NEGATIVE ng/mL (ref <50)
Creatinine: 52.6 mg/dL
HYDROCODONE: 227 ng/mL — AB (ref <50)
HYDROMORPHONE: 108 ng/mL — AB (ref <50)
MARIJUANA METABOLITE: NEGATIVE ng/mL (ref <20)
MDMA: NEGATIVE ng/mL (ref <500)
Morphine: NEGATIVE ng/mL (ref <50)
Norhydrocodone: 199 ng/mL — ABNORMAL HIGH (ref <50)
OXYCODONE: NEGATIVE ng/mL (ref <100)
Opiates: POSITIVE ng/mL — AB (ref <100)
Oxidant: NEGATIVE ug/mL (ref <200)
pH: 7.38 (ref 4.5–9.0)

## 2018-01-16 DIAGNOSIS — R296 Repeated falls: Secondary | ICD-10-CM | POA: Diagnosis not present

## 2018-01-16 DIAGNOSIS — M6281 Muscle weakness (generalized): Secondary | ICD-10-CM | POA: Diagnosis not present

## 2018-01-16 DIAGNOSIS — R2681 Unsteadiness on feet: Secondary | ICD-10-CM | POA: Diagnosis not present

## 2018-01-19 ENCOUNTER — Telehealth: Payer: Self-pay | Admitting: *Deleted

## 2018-01-19 NOTE — Telephone Encounter (Signed)
Received Physician Orders from [facility]; forwarded to provider/SLS 08/15

## 2018-03-07 ENCOUNTER — Other Ambulatory Visit: Payer: Self-pay | Admitting: Family Medicine

## 2018-03-07 DIAGNOSIS — R05 Cough: Secondary | ICD-10-CM

## 2018-03-07 DIAGNOSIS — R059 Cough, unspecified: Secondary | ICD-10-CM

## 2018-03-07 MED ORDER — HYDROCOD POLST-CPM POLST ER 10-8 MG/5ML PO SUER: 5.0000 mL | Freq: Two times a day (BID) | ORAL | 0 refills | Status: DC | PRN

## 2018-03-07 NOTE — Telephone Encounter (Signed)
Pt is requesting refill on Tussionex.   Last OV: 01/10/2018 Last Fill: 01/06/2018 #119mL and 0RF UDS: 01/10/2018 Low risk

## 2018-03-07 NOTE — Telephone Encounter (Signed)
I will refill it 1x but I need pt to F/u with pulmonary---  We need to get her off the cough med eventually -- I know she has been on it a long time--- pulm did have a plan and she stopped going before they cough get to the bottom of it

## 2018-03-07 NOTE — Telephone Encounter (Signed)
Copied from [place] [id number]. Topic: Quick Communication - Rx Refill/Question >> Mar 07, 2018 11:05 AM Oliver Pila B wrote: Medication: chlorpheniramine-HYDROcodone ([place] ER) 10-8 MG/5ML Latanya Presser [[id number]]   Has the patient contacted their pharmacy? Yes.   (Agent: If no, request that the patient contact the pharmacy for the refill.) (Agent: If yes, when and what did the pharmacy advise?)  Preferred Pharmacy (with phone number or street name): [place]: Please be advised that RX refills may take up to 3 business days. We ask that you follow-up with your pharmacy.

## 2018-03-10 NOTE — Telephone Encounter (Signed)
Author phoned pt, no answer, generic VM. Chief Strategy Officer then phoned Chrissie Noa, went to VM, but VM relayed a different name and number to which VM can be left. Pt. does not have email address. Routed to [place], [place] to reattempt contacting pt.

## 2018-04-10 ENCOUNTER — Other Ambulatory Visit: Payer: Self-pay | Admitting: Family Medicine

## 2018-04-10 DIAGNOSIS — R059 Cough, unspecified: Secondary | ICD-10-CM

## 2018-04-10 DIAGNOSIS — R05 Cough: Secondary | ICD-10-CM

## 2018-04-10 NOTE — Telephone Encounter (Signed)
Pt called regarding refill of Amlodipine which was previously prescribed by Dr. [name]. Pt states that it has been a while since she was seen by Dr. [name] and does not have any future appts. Pt would like to know if Dr. Carollee Herter would prescribe this medication.

## 2018-04-10 NOTE — Telephone Encounter (Signed)
Dr berry wanted to see her in a year which is March 2019----  He should be the one to fill it

## 2018-04-10 NOTE — Telephone Encounter (Signed)
Requested medication (s) are due for refill today: yes  Requested medication (s) are on the active medication list: yes  Last refill:  By Dr. [name] on 07/26/17 #90  Future visit scheduled: No  Notes to clinic:  Medication previously prescribed by Dr. [name]. Pt states she has not seen Dr. [name] in a while and does not have a future appt scheduled. Pt would like to know if Dr. Carollee Herter would fill this medication.     Requested Prescriptions  Pending Prescriptions Disp Refills   amLODipine (NORVASC) 2.5 MG tablet 90 tablet 0    Sig: Take 1 tablet (2.5 mg total) by mouth daily.     Cardiovascular:  Calcium Channel Blockers Failed - 04/10/2018  5:00 PM      Failed - Last BP in normal range    BP Readings from Last 1 Encounters:  01/10/18 (!) 158/68         Passed - Valid encounter within last 6 months    Recent Outpatient Visits          3 months ago Chronic cough   [facility] [place], DO   4 months ago Falls frequently   [facility] [place], DO   5 months ago Essential hypertension   [facility] [place], DO   9 months ago Essential hypertension   [facility] [place], DO   1 year ago [facility] [place], [place]

## 2018-04-10 NOTE — Telephone Encounter (Signed)
Copied from [place] [id number]. Topic: Quick Communication - Rx Refill/Question >> Apr 10, 2018  4:26 PM Wynetta Emery, [place] C wrote: Medication: amLODipine (NORVASC) 2.5 MG tablet   Has the patient contacted their pharmacy? No  (Agent: If no, request that the patient contact the pharmacy for the refill.) (Agent: If yes, when and what did the pharmacy advise?)  Preferred Pharmacy (with phone number or street name): [place], [place]. [phone] (Phone) [phone] (Fax)    Agent: Please be advised that RX refills may take up to 3 business days. We ask that you follow-up with your pharmacy.

## 2018-04-11 NOTE — Telephone Encounter (Signed)
Author phoned pt. To relay need to reach out to cardiology office to refill amlodopine per Dr. Etter Sjogren. No answer, left generic VM to return call. OK for PEC to discuss.  Routed to Dr. [name].

## 2018-04-11 NOTE — Telephone Encounter (Signed)
Please advise 

## 2018-04-11 NOTE — Telephone Encounter (Signed)
Pt states she does not see Dr [name] anymore. This was when she went to the hospital and he was on call. Pt wants to know if Dr Sherrine Maples will take this over? amLODipine (NORVASC) 2.5 MG tablet  [place], [place] [phone] (Phone) [phone] (Fax)

## 2018-04-11 NOTE — Telephone Encounter (Signed)
Ok to fill one time because I don't want her to run out but she did see Dr [name] in his office and he wanted to see her again

## 2018-04-12 MED ORDER — AMLODIPINE BESYLATE 2.5 MG PO TABS: 2.5000 mg | ORAL_TABLET | Freq: Every day | ORAL | 0 refills | Status: DC

## 2018-04-12 MED ORDER — HYDROCOD POLST-CPM POLST ER 10-8 MG/5ML PO SUER: 5.0000 mL | Freq: Two times a day (BID) | ORAL | 0 refills | Status: DC | PRN

## 2018-04-12 NOTE — Telephone Encounter (Signed)
Spoke w/ Pt- informed rx sent x1. Instructed to follow-up w/ Dr. [name]- Pt verbalized understanding. She is also needing refill on Tussionex.

## 2018-04-12 NOTE — Addendum Note (Signed)
Addended byDamita Dunnings D on: 04/12/2018 08:08 AM   Modules accepted: Orders

## 2018-04-21 ENCOUNTER — Other Ambulatory Visit: Payer: Self-pay

## 2018-05-17 ENCOUNTER — Other Ambulatory Visit: Payer: Self-pay | Admitting: Family Medicine

## 2018-06-06 ENCOUNTER — Other Ambulatory Visit: Payer: Self-pay | Admitting: Family Medicine

## 2018-06-06 DIAGNOSIS — R05 Cough: Secondary | ICD-10-CM

## 2018-06-06 DIAGNOSIS — R059 Cough, unspecified: Secondary | ICD-10-CM

## 2018-06-06 NOTE — Telephone Encounter (Signed)
Copied from [place] [id number]. Topic: Quick Communication - Rx Refill/Question >> Jun 06, 2018  1:28 PM Judyann Munson wrote: Medication: chlorpheniramine-HYDROcodone Amanda Cockayne [facility] ER) 10-8 MG/5ML SUER  Has the patient contacted their pharmacy? yes  Preferred Pharmacy (with phone number or street name): [place], [place] [address] Phone: [phone] Fax: [phone] Not a 24 hour pharmacy; exact hours not known.    Agent: Please be advised that RX refills may take up to 3 business days. We ask that you follow-up with your pharmacy.

## 2018-06-08 NOTE — Telephone Encounter (Signed)
Last OV: 01/10/2018 Last Fill: 04/12/2018 #186mL and 0RF UDS: 01/10/2018 Low risk

## 2018-06-13 ENCOUNTER — Other Ambulatory Visit: Payer: Self-pay | Admitting: Family Medicine

## 2018-06-13 DIAGNOSIS — R059 Cough, unspecified: Secondary | ICD-10-CM

## 2018-06-13 DIAGNOSIS — R05 Cough: Secondary | ICD-10-CM

## 2018-06-14 NOTE — Telephone Encounter (Signed)
Database ran and is on your desk for review.  Last filled per database: 04/12/18 Last written: 04/12/18 Last ov: 01/10/18 Next ov: none Contract:01/11/19 UDS:  07/13/18

## 2018-07-24 ENCOUNTER — Ambulatory Visit (INDEPENDENT_AMBULATORY_CARE_PROVIDER_SITE_OTHER): Payer: Medicare Other | Admitting: Family Medicine

## 2018-07-24 ENCOUNTER — Encounter: Payer: Self-pay | Admitting: Family Medicine

## 2018-07-24 VITALS — BP 146/88 | HR 97 | Ht 66.0 in | Wt 208.0 lb

## 2018-07-24 DIAGNOSIS — R110 Nausea: Secondary | ICD-10-CM | POA: Diagnosis not present

## 2018-07-24 DIAGNOSIS — R059 Cough, unspecified: Secondary | ICD-10-CM

## 2018-07-24 DIAGNOSIS — I10 Essential (primary) hypertension: Secondary | ICD-10-CM

## 2018-07-24 DIAGNOSIS — R05 Cough: Secondary | ICD-10-CM

## 2018-07-24 LAB — COMPREHENSIVE METABOLIC PANEL
ALT: 11 U/L (ref 0–35)
AST: 19 U/L (ref 0–37)
Albumin: 4.3 g/dL (ref 3.5–5.2)
Alkaline Phosphatase: 80 U/L (ref 39–117)
BUN: 19 mg/dL (ref 6–23)
CO2: 28 mEq/L (ref 19–32)
Calcium: 9.9 mg/dL (ref 8.4–10.5)
Chloride: 105 mEq/L (ref 96–112)
Creatinine, Ser: 1.03 mg/dL (ref 0.40–1.20)
GFR: 50.59 mL/min — ABNORMAL LOW (ref >60.00)
Glucose, Bld: 88 mg/dL (ref 70–99)
Potassium: 5.3 mEq/L — ABNORMAL HIGH (ref 3.5–5.1)
Sodium: 143 mEq/L (ref 135–145)
Total Bilirubin: 0.7 mg/dL (ref 0.2–1.2)
Total Protein: 6.7 g/dL (ref 6.0–8.3)

## 2018-07-24 LAB — CBC WITH DIFFERENTIAL/PLATELET
BASOS ABS: 0.1 10*3/uL (ref 0.0–0.1)
Basophils Relative: 0.7 % (ref 0.0–3.0)
EOS ABS: 0.3 10*3/uL (ref 0.0–0.7)
Eosinophils Relative: 2.8 % (ref 0.0–5.0)
HCT: 37.2 % (ref 36.0–46.0)
Hemoglobin: 11.8 g/dL — ABNORMAL LOW (ref 12.0–15.0)
Lymphocytes Relative: 30 % (ref 12.0–46.0)
Lymphs Abs: 2.7 10*3/uL (ref 0.7–4.0)
MCHC: 31.7 g/dL (ref 30.0–36.0)
MCV: 83.9 fl (ref 78.0–100.0)
Monocytes Absolute: 0.6 10*3/uL (ref 0.1–1.0)
Monocytes Relative: 6.4 % (ref 3.0–12.0)
NEUTROS ABS: 5.5 10*3/uL (ref 1.4–7.7)
Neutrophils Relative %: 60.1 % (ref 43.0–77.0)
PLATELETS: 323 10*3/uL (ref 150.0–400.0)
RBC: 4.43 Mil/uL (ref 3.87–5.11)
RDW: 17.3 % — ABNORMAL HIGH (ref 11.5–15.5)
WBC: 9.2 10*3/uL (ref 4.0–10.5)

## 2018-07-24 LAB — LIPID PANEL
Cholesterol: 228 mg/dL — ABNORMAL HIGH (ref 0–200)
HDL: 50.3 mg/dL (ref >39.00)
LDL Cholesterol: 154 mg/dL — ABNORMAL HIGH (ref 0–99)
NonHDL: 178
Total CHOL/HDL Ratio: 5
Triglycerides: 121 mg/dL (ref 0.0–149.0)
VLDL: 24.2 mg/dL (ref 0.0–40.0)

## 2018-07-24 MED ORDER — HYDROCOD POLST-CPM POLST ER 10-8 MG/5ML PO SUER: 5.0000 mL | Freq: Two times a day (BID) | ORAL | 0 refills | Status: DC | PRN

## 2018-07-24 MED ORDER — ONDANSETRON 8 MG PO TBDP: 8.0000 mg | ORAL_TABLET | Freq: Three times a day (TID) | ORAL | 0 refills | Status: DC | PRN

## 2018-07-24 NOTE — Assessment & Plan Note (Signed)
Well controlled, no changes to meds. Encouraged heart healthy diet such as the DASH diet and exercise as tolerated.  °

## 2018-07-24 NOTE — Assessment & Plan Note (Signed)
D/w pt importance of f/u with pulm Again discussed with her getting off the tussionex-- she has been on it for years with other pcp and she states its the only thing that works

## 2018-07-24 NOTE — Progress Notes (Signed)
Patient ID: Deanna Shields, female    DOB: [birth year]  Age: 85 y.o. MRN: [id number]    Subjective:  Subjective  HPI Deanna Shields presents for f/u chronic cough.  Pt was supposed to go back to pulmonary but refused when they called   I discussed how important it was she go back so we can get her off the tussionex  Cough is dry She has been on tussionex for years.  She saw pulmonary several years ago.   Review of Systems  Constitutional: Negative for appetite change, diaphoresis, fatigue and unexpected weight change.  Eyes: Negative for pain, redness and visual disturbance.  Respiratory: Positive for cough. Negative for chest tightness, shortness of breath and wheezing.   Cardiovascular: Negative for chest pain, palpitations and leg swelling.  Endocrine: Negative for cold intolerance, heat intolerance, polydipsia, polyphagia and polyuria.  Genitourinary: Negative for difficulty urinating, dysuria and frequency.  Neurological: Negative for dizziness, light-headedness, numbness and headaches.    History Past Medical History:  Diagnosis Date  . Arthritis    "right thumb" (01/29/2015)  . Chronic shoulder pain    "between my shoulders" (01/29/2015)  . Dizziness   . Esophageal stricture 2009  . Gallstones   . GERD (gastroesophageal reflux disease) 2004  . Hemorrhoids 2004  . Hiatal hernia 2004  . History of blood transfusion 1947   "appendix ruptured"  . HOH (hard of hearing)   . Hypertension   . Rhinitis, allergic   . Syncopal episodes     She has a past surgical history that includes Vaginal hysterectomy (1970's); Hemorroidectomy; Knee arthroscopy (Bilateral); Carpal tunnel release (Right, 1990's); NM MYOVIEW LTD (06/01/2012); left heart catheterization with coronary angiogram (N/A, 09/16/2014); Appendectomy (1947); Laparoscopic cholecystectomy (01/29/2015); Dilation and curettage of uterus ("couple"); Cardiac catheterization (10/23/1999; 09/2014); Esophagogastroduodenoscopy (egd)  with esophageal dilation (2-3 times); and Cholecystectomy (N/A, 01/29/2015).   Her family history includes Heart disease in her sister; Stroke (age of onset: 30) in her father.She reports that she has never smoked. She has never used smokeless tobacco. She reports that she does not drink alcohol or use drugs.  Current Outpatient Medications on File Prior to Visit  Medication Sig Dispense Refill  . amLODipine (NORVASC) 2.5 MG tablet TAKE 1 TABLET ONCE DAILY. 90 tablet 1  . hydrochlorothiazide (HYDRODIURIL) 25 MG tablet Take 1 tablet (25 mg total) by mouth daily. 90 tablet 3  . loratadine (CLARITIN) 10 MG tablet Take 1 tablet (10 mg total) by mouth daily. 30 tablet 11  . losartan (COZAAR) 50 MG tablet TAKE 1 TABLET DAILY 90 tablet 1  . traZODone (DESYREL) 50 MG tablet TAKE 1/2 TO 1 TABLET AT BEDTIME AS NEEDED FOR SLEEP. 30 tablet 1   No current facility-administered medications on file prior to visit.      Objective:  Objective  Physical Exam Vitals signs and nursing note reviewed.  Constitutional:      Appearance: She is well-developed.  HENT:     Head: Normocephalic and atraumatic.  Eyes:     Conjunctiva/sclera: Conjunctivae normal.  Neck:     Musculoskeletal: Normal range of motion and neck supple.     Thyroid: No thyromegaly.     Vascular: No carotid bruit or JVD.  Cardiovascular:     Rate and Rhythm: Normal rate and regular rhythm.     Heart sounds: Normal heart sounds. No murmur.  Pulmonary:     Effort: Pulmonary effort is normal. No respiratory distress.     Breath sounds: Normal breath sounds.  No wheezing or rales.  Chest:     Chest wall: No tenderness.  Neurological:     Mental Status: She is alert and oriented to person, place, and time.    BP (!) 146/88 (BP Location: Right Arm, Patient Position: Sitting, Cuff Size: Normal)   Pulse 97   Ht 5\' 6"  (1.676 m)   Wt 208 lb (94.3 kg)   SpO2 97%   BMI 33.57 kg/m  Wt Readings from Last 3 Encounters:  07/24/18 208 lb  (94.3 kg)  01/10/18 192 lb 6.4 oz (87.3 kg)  12/01/17 192 lb 6.4 oz (87.3 kg)     Lab Results  Component Value Date   WBC 9.2 07/24/2018   HGB 11.8 (L) 07/24/2018   HCT 37.2 07/24/2018   PLT 323.0 07/24/2018   GLUCOSE 88 07/24/2018   CHOL 228 (H) 07/24/2018   TRIG 121.0 07/24/2018   HDL 50.30 07/24/2018   LDLDIRECT 161.2 07/18/2013   LDLCALC 154 (H) 07/24/2018   ALT 11 07/24/2018   AST 19 07/24/2018   NA 143 07/24/2018   K 5.3 (H) 07/24/2018   CL 105 07/24/2018   CREATININE 1.03 07/24/2018   BUN 19 07/24/2018   CO2 28 07/24/2018   TSH 2.780 10/28/2013   INR 0.99 09/09/2014   HGBA1C 5.8 07/11/2017    Dg Lumbar Spine Complete  Result Date: 01/10/2018 CLINICAL DATA:  Several month history of low back pain. No known injury. No radicular symptoms. EXAM: LUMBAR SPINE - COMPLETE 4+ VIEW COMPARISON:  Coronal and sagittal reconstructed images through the lumbar spine from an abdominal and pelvic CT scan dated May 31, 2016 FINDINGS: There is chronic mild curvature centered at L3-4 convex toward the right. The vertebral bodies are preserved in height. There is moderate degenerative disc space narrowing at L4-5 and L1-2 with mild narrowing at L2-3. There is no spondylolisthesis. There is facet joint hypertrophy at L5-S1. The pedicles are intact where visualized. There is calcification in the wall of the abdominal aorta. IMPRESSION: There is multilevel degenerative disc disease of the lumbar spine not greatly changed from the previous study. There is stable mild dextrocurvature centered at L3-4. Abdominal aortic atherosclerosis. Electronically Signed   By: David  Martinique M.D.   On: 01/10/2018 15:21     Assessment & Plan:  Plan  I am having Deanna Shields maintain her losartan, hydrochlorothiazide, traZODone, loratadine, amLODipine, ondansetron, and chlorpheniramine-HYDROcodone.  Meds ordered this encounter  Medications  . ondansetron (ZOFRAN ODT) 8 MG disintegrating tablet    Sig:  Take 1 tablet (8 mg total) by mouth every 8 (eight) hours as needed for nausea or vomiting.    Dispense:  10 tablet    Refill:  0  . chlorpheniramine-HYDROcodone (TUSSIONEX PENNKINETIC ER) 10-8 MG/5ML SUER    Sig: Take 5 mLs by mouth every 12 (twelve) hours as needed for cough.    Dispense:  140 mL    Refill:  0    Problem List Items Addressed This Visit      Unprioritized   COUGH, CHRONIC - Primary    D/w pt importance of f/u with pulm Again discussed with her getting off the tussionex-- she has been on it for years with other pcp and she states its the only thing that works       Relevant Medications   chlorpheniramine-HYDROcodone ([place] ER) 10-8 MG/5ML SUER   Other Relevant Orders   Ambulatory referral to Pulmonology   Essential hypertension (Chronic)    Well controlled, no  changes to meds. Encouraged heart healthy diet such as the DASH diet and exercise as tolerated.       Relevant Orders   Lipid panel (Completed)   CBC with Differential/Platelet (Completed)   Comprehensive metabolic panel (Completed)    Other Visit Diagnoses    Nausea       Relevant Medications   ondansetron (ZOFRAN ODT) 8 MG disintegrating tablet      Follow-up: Return in about 3 months (around 10/22/2018), or if symptoms worsen or fail to improve, for hypertension.  [name], DO

## 2018-07-24 NOTE — Patient Instructions (Signed)
Cough, Adult  Coughing is a reflex that clears your throat and your airways. Coughing helps to heal and protect your lungs. It is normal to cough occasionally, but a cough that happens with other symptoms or lasts a long time may be a sign of a condition that needs treatment. A cough may last only 2-3 weeks (acute), or it may last longer than 8 weeks (chronic). What are the causes? Coughing is commonly caused by:  Breathing in substances that irritate your lungs.  A viral or bacterial respiratory infection.  Allergies.  Asthma.  Postnasal drip.  Smoking.  Acid backing up from the stomach into the esophagus (gastroesophageal reflux).  Certain medicines.  Chronic lung problems, including COPD (or rarely, lung cancer).  Other medical conditions such as heart failure. Follow these instructions at home: Pay attention to any changes in your symptoms. Take these actions to help with your discomfort:  Take medicines only as told by your health care provider. ? If you were prescribed an antibiotic medicine, take it as told by your health care provider. Do not stop taking the antibiotic even if you start to feel better. ? Talk with your health care provider before you take a cough suppressant medicine.  Drink enough fluid to keep your urine clear or pale yellow.  If the air is dry, use a cold steam vaporizer or humidifier in your bedroom or your home to help loosen secretions.  Avoid anything that causes you to cough at work or at home.  If your cough is worse at night, try sleeping in a semi-upright position.  Avoid cigarette smoke. If you smoke, quit smoking. If you need help quitting, ask your health care provider.  Avoid caffeine.  Avoid alcohol.  Rest as needed. Contact a health care provider if:  You have new symptoms.  You cough up pus.  Your cough does not get better after 2-3 weeks, or your cough gets worse.  You cannot control your cough with suppressant  medicines and you are losing sleep.  You develop pain that is getting worse or pain that is not controlled with pain medicines.  You have a fever.  You have unexplained weight loss.  You have night sweats. Get help right away if:  You cough up blood.  You have difficulty breathing.  Your heartbeat is very fast. This information is not intended to replace advice given to you by your health care provider. Make sure you discuss any questions you have with your health care provider. Document Released: 11/20/2010 Document Revised: 10/30/2015 Document Reviewed: 07/31/2014 Elsevier Interactive Patient Education  2019 Elsevier Inc.  

## 2018-08-07 ENCOUNTER — Other Ambulatory Visit: Payer: Self-pay | Admitting: *Deleted

## 2018-08-07 MED ORDER — ROSUVASTATIN CALCIUM 10 MG PO TABS: 10.0000 mg | ORAL_TABLET | Freq: Every day | ORAL | 2 refills | Status: DC

## 2018-09-07 ENCOUNTER — Other Ambulatory Visit: Payer: Medicare Other

## 2018-10-12 ENCOUNTER — Encounter: Payer: Self-pay | Admitting: Family Medicine

## 2018-10-12 ENCOUNTER — Other Ambulatory Visit: Payer: Self-pay

## 2018-10-12 ENCOUNTER — Ambulatory Visit (INDEPENDENT_AMBULATORY_CARE_PROVIDER_SITE_OTHER): Payer: Medicare Other | Admitting: Family Medicine

## 2018-10-12 ENCOUNTER — Telehealth: Payer: Self-pay

## 2018-10-12 DIAGNOSIS — R300 Dysuria: Secondary | ICD-10-CM | POA: Diagnosis not present

## 2018-10-12 MED ORDER — CEPHALEXIN 500 MG PO CAPS: 500.0000 mg | ORAL_CAPSULE | Freq: Two times a day (BID) | ORAL | 0 refills | Status: DC

## 2018-10-12 NOTE — Progress Notes (Signed)
Virtual Visit via Video Note  I connected with Deanna Shields on 10/12/18 at  3:00 PM EDT by a video enabled telemedicine application and verified that I am speaking with the correct person using two identifiers.  Location: Patient: home  Provider: home    I discussed the limitations of evaluation and management by telemedicine and the availability of in person appointments. The patient expressed understanding and agreed to proceed.  History of Present Illness: Pt home and she is c/o low back pain and dark urine and urinary frequency and it is presenting like the past when she had a uti No fevers  No abd pain  Past Medical History:  Diagnosis Date  . Arthritis    "right thumb" (01/29/2015)  . Chronic shoulder pain    "between my shoulders" (01/29/2015)  . Dizziness   . Esophageal stricture 2009  . Gallstones   . GERD (gastroesophageal reflux disease) 2004  . Hemorrhoids 2004  . Hiatal hernia 2004  . History of blood transfusion 1947   "appendix ruptured"  . HOH (hard of hearing)   . Hypertension   . Rhinitis, allergic   . Syncopal episodes    Current Outpatient Medications on File Prior to Visit  Medication Sig Dispense Refill  . amLODipine (NORVASC) 2.5 MG tablet TAKE 1 TABLET ONCE DAILY. 90 tablet 1  . chlorpheniramine-HYDROcodone (TUSSIONEX PENNKINETIC ER) 10-8 MG/5ML SUER Take 5 mLs by mouth every 12 (twelve) hours as needed for cough. 140 mL 0  . hydrochlorothiazide (HYDRODIURIL) 25 MG tablet Take 1 tablet (25 mg total) by mouth daily. 90 tablet 3  . loratadine (CLARITIN) 10 MG tablet Take 1 tablet (10 mg total) by mouth daily. 30 tablet 11  . losartan (COZAAR) 50 MG tablet TAKE 1 TABLET DAILY 90 tablet 1  . ondansetron (ZOFRAN ODT) 8 MG disintegrating tablet Take 1 tablet (8 mg total) by mouth every 8 (eight) hours as needed for nausea or vomiting. 10 tablet 0  . rosuvastatin (CRESTOR) 10 MG tablet Take 1 tablet (10 mg total) by mouth daily. 30 tablet 2  . traZODone  (DESYREL) 50 MG tablet TAKE 1/2 TO 1 TABLET AT BEDTIME AS NEEDED FOR SLEEP. 30 tablet 1   No current facility-administered medications on file prior to visit.       Observations/Objective: Afebrile,  No other vitals obtained Pt is in NAD  Assessment and Plan:  1. Dysuria Keflex sent to pharmacy-- if no better in a few days we will need to check ua and culture in office --- pt agrees - cephALEXin (KEFLEX) 500 MG capsule; Take 1 capsule (500 mg total) by mouth 2 (two) times daily.  Dispense: 14 capsule; Refill: 0   Follow Up Instructions:    I discussed the assessment and treatment plan with the patient. The patient was provided an opportunity to ask questions and all were answered. The patient agreed with the plan and demonstrated an understanding of the instructions.   The patient was advised to call back or seek an in-person evaluation if the symptoms worsen or if the condition fails to improve as anticipated.  I provided 15 minutes of non-face-to-face time during this encounter.   [name], DO

## 2018-10-12 NOTE — Telephone Encounter (Signed)
We can do virtual visit

## 2018-10-12 NOTE — Telephone Encounter (Signed)
Scheduled for phone visit patient states she cannot do Virtual visit.

## 2018-10-12 NOTE — Telephone Encounter (Signed)
Copied from [place] [id number]. Topic: General - Inquiry >> Oct 11, 2018  4:21 PM [place], [place] D wrote: Reason for CRM: Pt stated she thinks she has a UTI, She is having frequent urination and lower back pain. She would like to know if Dr. Etter Sjogren could send a rx to her pharmacy. Please advise.  [place], [place] [phone] (Phone) [phone] (Fax)

## 2019-01-04 DIAGNOSIS — H6123 Impacted cerumen, bilateral: Secondary | ICD-10-CM | POA: Diagnosis not present

## 2019-01-05 ENCOUNTER — Other Ambulatory Visit: Payer: Self-pay

## 2019-01-05 ENCOUNTER — Encounter: Payer: Self-pay | Admitting: Family Medicine

## 2019-01-05 ENCOUNTER — Ambulatory Visit (INDEPENDENT_AMBULATORY_CARE_PROVIDER_SITE_OTHER): Payer: Medicare Other | Admitting: Family Medicine

## 2019-01-05 DIAGNOSIS — R300 Dysuria: Secondary | ICD-10-CM | POA: Diagnosis not present

## 2019-01-05 MED ORDER — CEPHALEXIN 500 MG PO CAPS: 500.0000 mg | ORAL_CAPSULE | Freq: Two times a day (BID) | ORAL | 0 refills | Status: DC

## 2019-01-05 NOTE — Progress Notes (Signed)
Virtual Visit via Telephone Note  I connected with Deanna Shields on 01/05/19 at  4:00 PM EDT b telephone and verified that I am speaking with the correct person using two identifiers.  Location: Patient: home  Provider: office    I discussed the limitations, risks, security and privacy concerns of performing an evaluation and management service by telephone and the availability of in person appointments. I also discussed with the patient that there may be a patient responsible charge related to this service. The patient expressed understanding and agreed to proceed.   History of Present Illness: Pt is home and c/o possible uti The urine is cloudy  No burning    No fever ,   She has a hx of uti    Observations/Objective: No vitals obtained Pt is in NAD   Assessment and Plan: 1. Dysuria Pt will call Monday if no better so UA/ culture can be checked in the lab  - cephALEXin (KEFLEX) 500 MG capsule; Take 1 capsule (500 mg total) by mouth 2 (two) times daily.  Dispense: 14 capsule; Refill: 0   Follow Up Instructions:    I discussed the assessment and treatment plan with the patient. The patient was provided an opportunity to ask questions and all were answered. The patient agreed with the plan and demonstrated an understanding of the instructions.   The patient was advised to call back or seek an in-person evaluation if the symptoms worsen or if the condition fails to improve as anticipated.  I provided 15 minutes of non-face-to-face time during this encounter.   [name], DO

## 2019-01-05 NOTE — Progress Notes (Deleted)
Virtual Visit via Telephone Note  I connected with Deanna Shields on 01/05/19 at  1:00 PM EDT by telephone and verified that I am speaking with the correct person using two identifiers.  Location: Patient: *** Provider: ***   I discussed the limitations, risks, security and privacy concerns of performing an evaluation and management service by telephone and the availability of in person appointments. I also discussed with the patient that there may be a patient responsible charge related to this service. The patient expressed understanding and agreed to proceed.   History of Present Illness:    Observations/Objective:   Assessment and Plan:   Follow Up Instructions:    I discussed the assessment and treatment plan with the patient. The patient was provided an opportunity to ask questions and all were answered. The patient agreed with the plan and demonstrated an understanding of the instructions.   The patient was advised to call back or seek an in-person evaluation if the symptoms worsen or if the condition fails to improve as anticipated.  I provided *** minutes of non-face-to-face time during this encounter.   [name], DO

## 2019-01-16 ENCOUNTER — Telehealth: Payer: Self-pay | Admitting: Family Medicine

## 2019-01-16 DIAGNOSIS — R300 Dysuria: Secondary | ICD-10-CM

## 2019-01-16 NOTE — Telephone Encounter (Signed)
Please advise 

## 2019-01-16 NOTE — Telephone Encounter (Signed)
Pt request refill  cephALEXin (KEFLEX) 500 MG capsule  Pt called for a refill of this.  Pt states tis med has helped, but not cleared completely up.  Pt states her urine is just a "little yellow".  No pain.  Pt states it does not hurt to go, but she does have frequency. But pt reports drinking l"ots of water"  Pt feels it is not completely gone, perhaps another round of medicine.  [place], [place] [phone] (Phone) [phone] (Fax)

## 2019-01-16 NOTE — Telephone Encounter (Signed)
Is the pt able to come in and give a urine sample?  Or can someone pick up a urine cup for her so a urine sample can be dropped off?

## 2019-01-16 NOTE — Telephone Encounter (Signed)
Left message on machine to call back  

## 2019-01-17 NOTE — Telephone Encounter (Signed)
Patient states that she will try and get someone to bring her in.  Advised her to call and make lab only appt.  She also stated that she feels a little better.  She has increased her water intake.  Future orders placed if she get someone to bring her.

## 2019-01-18 ENCOUNTER — Other Ambulatory Visit: Payer: Self-pay | Admitting: Family Medicine

## 2019-01-31 ENCOUNTER — Other Ambulatory Visit: Payer: Self-pay | Admitting: Family Medicine

## 2019-01-31 DIAGNOSIS — I10 Essential (primary) hypertension: Secondary | ICD-10-CM

## 2019-02-14 ENCOUNTER — Ambulatory Visit (INDEPENDENT_AMBULATORY_CARE_PROVIDER_SITE_OTHER): Payer: Medicare Other | Admitting: Cardiovascular Disease

## 2019-02-14 ENCOUNTER — Encounter: Payer: Self-pay | Admitting: Cardiovascular Disease

## 2019-02-14 ENCOUNTER — Other Ambulatory Visit: Payer: Self-pay

## 2019-02-14 DIAGNOSIS — I10 Essential (primary) hypertension: Secondary | ICD-10-CM

## 2019-02-14 DIAGNOSIS — IMO0001 Reserved for inherently not codable concepts without codable children: Secondary | ICD-10-CM

## 2019-02-14 DIAGNOSIS — E785 Hyperlipidemia, unspecified: Secondary | ICD-10-CM

## 2019-02-14 DIAGNOSIS — Z0389 Encounter for observation for other suspected diseases and conditions ruled out: Secondary | ICD-10-CM | POA: Diagnosis not present

## 2019-02-14 MED ORDER — ROSUVASTATIN CALCIUM 20 MG PO TABS: 20.0000 mg | ORAL_TABLET | Freq: Every day | ORAL | 3 refills | Status: DC

## 2019-02-14 NOTE — Patient Instructions (Signed)
Medication Instructions:  Your physician has recommended you make the following change in your medication:  [place] TO 20 MG BY MOUTH DAILY  If you need a refill on your cardiac medications before your next appointment, please call your pharmacy.   Lab work: Your physician recommends that you return for lab work in: 2 MONTHS; [place]  If you have labs (blood work) drawn today and your tests are completely normal, you will receive your results only by: [name] MyChart Message (if you have MyChart) OR . A paper copy in the mail If you have any lab test that is abnormal or we need to change your treatment, we will call you to review the results.  Testing/Procedures: NONE  Follow-Up: At [facility], you and your health needs are our priority.  As part of our continuing mission to provide you with exceptional heart care, we have created designated Provider Care Teams.  These Care Teams include your primary Cardiologist (physician) and Advanced Practice Providers (APPs -  Physician Assistants and Nurse Practitioners) who all work together to provide you with the care you need, when you need it. You will need a follow up appointment in 12 months with Dr. Quay Burow.  Please call our office 2 months in advance to schedule this appointment.

## 2019-02-14 NOTE — Progress Notes (Signed)
02/14/2019 [place]   May 05, 1929  [id number]  Primary Physician [name], DO Primary Cardiologist: [name] MD [name]  HPI:  Deanna Shields is a 85 y.o.  mildly overweight, married Caucasian female with no children whose husband Billywasalso a long-term patient of mine. Unfortunately, hehadmesothelioma treated by Dr. Julien Nordmann secondary to remote asbestos exposure andwasin hospice.Marland KitchenHe expired 3 years ago of a ruptured abdominal aortic aneurysm. They were married for close to 60 years.I last saw her in the office 6 /1/18.[name]   She was remotely a patient of Dr. Alla German. She had a cath done by him Oct 23, 1999, after an abnormal Myoview done because of chest pain that showed minimal CAD.[name] Her risk factors at that time were family history and hyperlipidemia. She does not smoke or drink alcohol. She has been on statin drugs in the past.  Deanna Shields was admitted to the hospital 10/28/13 for 2 days because of chest pain/rule out myocardial function. A 2-D echo and Myoview stress test were normal. She was seen back a week later because of witnessed syncope. Over the ensuing months she's noticed jaw pain with associated with left shoulder arm back and chest pain with increasing dyspnea on exertion. A myoview stress test was performed that showed moderate inferoapical ischemia. A 2-D echo was essentially normal. She underwent outpatient diagnostic coronary arteriography by myself 09/16/14 which was essentially normal.  Since I saw her in the office a year ago she started remarkably well. Her husband Deanna Shields also is a patient of mine died 3 years ago and she seems to still the grieving.she was seen in the ER late last year with palpitations.  Which was performed 06/02/17 and an event monitor showed PVC.  Since I saw her a year and a half ago she is remained stable.  She does live alone and continues to drive and is independent.  She did complains of some  dyspnea but denies chest pain.   Current Meds  Medication Sig  . amLODipine (NORVASC) 2.5 MG tablet TAKE 1 TABLET ONCE DAILY.  . cephALEXin (KEFLEX) 500 MG capsule Take 1 capsule (500 mg total) by mouth 2 (two) times daily.  . chlorpheniramine-HYDROcodone (TUSSIONEX PENNKINETIC ER) 10-8 MG/5ML SUER Take 5 mLs by mouth every 12 (twelve) hours as needed for cough.  . hydrochlorothiazide (HYDRODIURIL) 25 MG tablet TAKE 1 TABLET ONCE DAILY.  [name] loratadine (CLARITIN) 10 MG tablet Take 1 tablet (10 mg total) by mouth daily.  . ondansetron (ZOFRAN ODT) 8 MG disintegrating tablet Take 1 tablet (8 mg total) by mouth every 8 (eight) hours as needed for nausea or vomiting.  . rosuvastatin (CRESTOR) 10 MG tablet Take 1 tablet (10 mg total) by mouth daily.  . traZODone (DESYREL) 50 MG tablet TAKE 1/2 TO 1 TABLET AT BEDTIME AS NEEDED FOR SLEEP.  . [DISCONTINUED] losartan (COZAAR) 50 MG tablet TAKE 1 TABLET DAILY     Allergies  Allergen Reactions  . Contrast Media [Iodinated Diagnostic Agents] Itching and Rash    Pt covered in rash, red.  . Codeine Nausea And Vomiting    in large doses: can tolerate Tussionex    Social History   Socioeconomic History  . Marital status: Married    Spouse name: Not on file  . Number of children: 0  . Years of education: Not on file  . Highest education level: Not on file  Occupational History  . Occupation: retired    Fish farm manager:  RETIRED    Comment: school cafeteria mgr  Social Needs  . Financial resource strain: Not on file  . Food insecurity    Worry: Not on file    Inability: Not on file  . Transportation needs    Medical: Not on file    Non-medical: Not on file  Tobacco Use  . Smoking status: Never Smoker  . Smokeless tobacco: Never Used  Substance and Sexual Activity  . Alcohol use: No    Alcohol/week: 0.0 standard drinks  . Drug use: No  . Sexual activity: Never  Lifestyle  . Physical activity    Days per week: Not on file    Minutes per  session: Not on file  . Stress: Not on file  Relationships  . Social Herbalist on phone: Not on file    Gets together: Not on file    Attends religious service: Not on file    Active member of club or organization: Not on file    Attends meetings of clubs or organizations: Not on file    Relationship status: Not on file  . Intimate partner violence    Fear of current or ex partner: Not on file    Emotionally abused: Not on file    Physically abused: Not on file    Forced sexual activity: Not on file  Other Topics Concern  . Not on file  Social History Narrative   Patient doesn't get regular exercise   married     Review of Systems: General: negative for chills, fever, night sweats or weight changes.  Cardiovascular: negative for chest pain, dyspnea on exertion, edema, orthopnea, palpitations, paroxysmal nocturnal dyspnea or shortness of breath Dermatological: negative for rash Respiratory: negative for cough or wheezing Urologic: negative for hematuria Abdominal: negative for nausea, vomiting, diarrhea, bright red blood per rectum, melena, or hematemesis Neurologic: negative for visual changes, syncope, or dizziness All other systems reviewed and are otherwise negative except as noted above.    Blood pressure 138/82, pulse 76, height 5\' 6"  (1.676 m), weight 198 lb 6.4 oz (90 kg).  General appearance: alert and no distress Neck: no adenopathy, no carotid bruit, no JVD, supple, symmetrical, trachea midline and thyroid not enlarged, symmetric, no tenderness/mass/nodules Lungs: clear to auscultation bilaterally Heart: regular rate and rhythm, S1, S2 normal, no murmur, click, rub or gallop Extremities: extremities normal, atraumatic, no cyanosis or edema Pulses: 2+ and symmetric Skin: Skin color, texture, turgor normal. No rashes or lesions Neurologic: Alert and oriented X 3, normal strength and tone. Normal symmetric reflexes. Normal coordination and gait  EKG sinus  rhythm at 76 with nonspecific ST and T wave changes.  I personally reviewed this EKG.  ASSESSMENT AND PLAN:   Essential hypertension History of essential hypertension with blood pressure measured today at 138/82.  She is on amlodipine and HydroDIURIL.  Dyslipidemia History of just lipidemia on low-dose Crestor with lipid profile performed 07/14/2018 revealing total cholesterol 228, LDL 154 and HDL 50.  She does admit to dietary indiscretion.  And increase her Crestor from 10 to 20 mg a day and recheck a lipid liver profile in 2 months.  Normal coronary arteries History of normal coronary arteries by cath 09/16/2014 after a false positive Myoview stress test.      [name] MD [facility], [facility] 02/14/2019 11:26 AM

## 2019-02-14 NOTE — Assessment & Plan Note (Signed)
History of normal coronary arteries by cath 09/16/2014 after a false positive Myoview stress test.

## 2019-02-14 NOTE — Assessment & Plan Note (Signed)
History of just lipidemia on low-dose Crestor with lipid profile performed 07/14/2018 revealing total cholesterol 228, LDL 154 and HDL 50.  She does admit to dietary indiscretion.  And increase her Crestor from 10 to 20 mg a day and recheck a lipid liver profile in 2 months.

## 2019-02-14 NOTE — Assessment & Plan Note (Signed)
History of essential hypertension with blood pressure measured today at 138/82.  She is on amlodipine and HydroDIURIL.

## 2019-04-16 ENCOUNTER — Other Ambulatory Visit: Payer: Self-pay

## 2019-04-17 ENCOUNTER — Encounter: Payer: Self-pay | Admitting: Family Medicine

## 2019-04-17 ENCOUNTER — Ambulatory Visit (INDEPENDENT_AMBULATORY_CARE_PROVIDER_SITE_OTHER): Payer: Medicare Other | Admitting: Family Medicine

## 2019-04-17 VITALS — BP 138/86 | HR 81 | Temp 97.3°F | Resp 18 | Ht 65.0 in | Wt 200.6 lb

## 2019-04-17 DIAGNOSIS — N390 Urinary tract infection, site not specified: Secondary | ICD-10-CM

## 2019-04-17 DIAGNOSIS — R8290 Unspecified abnormal findings in urine: Secondary | ICD-10-CM | POA: Diagnosis not present

## 2019-04-17 LAB — POCT URINALYSIS DIP (MANUAL ENTRY)
Bilirubin, UA: NEGATIVE
Blood, UA: NEGATIVE
Glucose, UA: NEGATIVE mg/dL
Ketones, POC UA: NEGATIVE mg/dL
Leukocytes, UA: NEGATIVE
Nitrite, UA: POSITIVE — AB
Protein Ur, POC: NEGATIVE mg/dL
Spec Grav, UA: 1.02 (ref 1.010–1.025)
Urobilinogen, UA: 0.2 E.U./dL
pH, UA: 6 (ref 5.0–8.0)

## 2019-04-17 MED ORDER — CEPHALEXIN 500 MG PO CAPS: 500.0000 mg | ORAL_CAPSULE | Freq: Two times a day (BID) | ORAL | 0 refills | Status: DC

## 2019-04-17 NOTE — Patient Instructions (Signed)
Urinary Tract Infection, Adult A urinary tract infection (UTI) is an infection of any part of the urinary tract. The urinary tract includes:  The kidneys.  The ureters.  The bladder.  The urethra. These organs make, store, and get rid of pee (urine) in the body. What are the causes? This is caused by germs (bacteria) in your genital area. These germs grow and cause swelling (inflammation) of your urinary tract. What increases the risk? You are more likely to develop this condition if:  You have a small, thin tube (catheter) to drain pee.  You cannot control when you pee or poop (incontinence).  You are female, and: ? You use these methods to prevent pregnancy: ? A medicine that kills sperm (spermicide). ? A device that blocks sperm (diaphragm). ? You have low levels of a female hormone (estrogen). ? You are pregnant.  You have genes that add to your risk.  You are sexually active.  You take antibiotic medicines.  You have trouble peeing because of: ? A prostate that is bigger than normal, if you are female. ? A blockage in the part of your body that drains pee from the bladder (urethra). ? A kidney stone. ? A nerve condition that affects your bladder (neurogenic bladder). ? Not getting enough to drink. ? Not peeing often enough.  You have other conditions, such as: ? Diabetes. ? A weak disease-fighting system (immune system). ? Sickle cell disease. ? Gout. ? Injury of the spine. What are the signs or symptoms? Symptoms of this condition include:  Needing to pee right away (urgently).  Peeing often.  Peeing small amounts often.  Pain or burning when peeing.  Blood in the pee.  Pee that smells bad or not like normal.  Trouble peeing.  Pee that is cloudy.  Fluid coming from the vagina, if you are female.  Pain in the belly or lower back. Other symptoms include:  Throwing up (vomiting).  No urge to eat.  Feeling mixed up (confused).  Being tired  and grouchy (irritable).  A fever.  Watery poop (diarrhea). How is this treated? This condition may be treated with:  Antibiotic medicine.  Other medicines.  Drinking enough water. Follow these instructions at home:  Medicines  Take over-the-counter and prescription medicines only as told by your doctor.  If you were prescribed an antibiotic medicine, take it as told by your doctor. Do not stop taking it even if you start to feel better. General instructions  Make sure you: ? Pee until your bladder is empty. ? Do not hold pee for a long time. ? Empty your bladder after sex. ? Wipe from front to back after pooping if you are a female. Use each tissue one time when you wipe.  Drink enough fluid to keep your pee pale yellow.  Keep all follow-up visits as told by your doctor. This is important. Contact a doctor if:  You do not get better after 1-2 days.  Your symptoms go away and then come back. Get help right away if:  You have very bad back pain.  You have very bad pain in your lower belly.  You have a fever.  You are sick to your stomach (nauseous).  You are throwing up. Summary  A urinary tract infection (UTI) is an infection of any part of the urinary tract.  This condition is caused by germs in your genital area.  There are many risk factors for a UTI. These include having a small, thin   tube to drain pee and not being able to control when you pee or poop.  Treatment includes antibiotic medicines for germs.  Drink enough fluid to keep your pee pale yellow. This information is not intended to replace advice given to you by your health care provider. Make sure you discuss any questions you have with your health care provider. Document Released: 11/10/2007 Document Revised: 05/11/2018 Document Reviewed: 12/01/2017 Elsevier Patient Education  2020 Elsevier Inc.  

## 2019-04-17 NOTE — Progress Notes (Signed)
Patient ID: Deanna Shields, female    DOB: May 28, 1929  Age: 85 y.o. MRN: [id number]    Subjective:  Subjective  HPI [place] presents for few week hx of low back pain and cloudy   Review of Systems  Constitutional: Negative for appetite change, diaphoresis, fatigue and unexpected weight change.  Eyes: Negative for pain, redness and visual disturbance.  Respiratory: Negative for cough, chest tightness, shortness of breath and wheezing.   Cardiovascular: Negative for chest pain, palpitations and leg swelling.  Endocrine: Negative for cold intolerance, heat intolerance, polydipsia, polyphagia and polyuria.  Genitourinary: Positive for flank pain. Negative for decreased urine volume, difficulty urinating, dysuria, frequency, genital sores, hematuria, pelvic pain and urgency.  Neurological: Negative for dizziness, light-headedness, numbness and headaches.    History Past Medical History:  Diagnosis Date  . Arthritis    "right thumb" (01/29/2015)  . Chronic shoulder pain    "between my shoulders" (01/29/2015)  . Dizziness   . Esophageal stricture 2009  . Gallstones   . GERD (gastroesophageal reflux disease) 2004  . Hemorrhoids 2004  . Hiatal hernia 2004  . History of blood transfusion 1947   "appendix ruptured"  . HOH (hard of hearing)   . Hypertension   . Rhinitis, allergic   . Syncopal episodes     She has a past surgical history that includes Vaginal hysterectomy (1970's); Hemorroidectomy; Knee arthroscopy (Bilateral); Carpal tunnel release (Right, 1990's); NM MYOVIEW LTD (06/01/2012); left heart catheterization with coronary angiogram (N/A, 09/16/2014); Appendectomy (1947); Laparoscopic cholecystectomy (01/29/2015); Dilation and curettage of uterus ("couple"); Cardiac catheterization (10/23/1999; 09/2014); Esophagogastroduodenoscopy (egd) with esophageal dilation (2-3 times); and Cholecystectomy (N/A, 01/29/2015).   Her family history includes Heart disease in her sister;  Stroke (age of onset: 5) in her father.She reports that she has never smoked. She has never used smokeless tobacco. She reports that she does not drink alcohol or use drugs.  Current Outpatient Medications on File Prior to Visit  Medication Sig Dispense Refill  . amLODipine (NORVASC) 2.5 MG tablet TAKE 1 TABLET ONCE DAILY. 90 tablet 1  . hydrochlorothiazide (HYDRODIURIL) 25 MG tablet TAKE 1 TABLET ONCE DAILY. 90 tablet 0  . loratadine (CLARITIN) 10 MG tablet Take 1 tablet (10 mg total) by mouth daily. 30 tablet 11  . ondansetron (ZOFRAN ODT) 8 MG disintegrating tablet Take 1 tablet (8 mg total) by mouth every 8 (eight) hours as needed for nausea or vomiting. 10 tablet 0  . rosuvastatin (CRESTOR) 20 MG tablet Take 1 tablet (20 mg total) by mouth daily. 90 tablet 3  . traZODone (DESYREL) 50 MG tablet TAKE 1/2 TO 1 TABLET AT BEDTIME AS NEEDED FOR SLEEP. 30 tablet 1   No current facility-administered medications on file prior to visit.      Objective:  Objective  Physical Exam Vitals signs and nursing note reviewed.  Constitutional:      Appearance: She is well-developed.  HENT:     Head: Normocephalic and atraumatic.  Eyes:     Conjunctiva/sclera: Conjunctivae normal.  Neck:     Musculoskeletal: Normal range of motion and neck supple.     Thyroid: No thyromegaly.     Vascular: No carotid bruit or JVD.  Cardiovascular:     Rate and Rhythm: Normal rate and regular rhythm.     Heart sounds: Normal heart sounds. No murmur.  Pulmonary:     Effort: Pulmonary effort is normal. No respiratory distress.     Breath sounds: Normal breath sounds. No wheezing or  rales.  Chest:     Chest wall: No tenderness.  Musculoskeletal:        General: Tenderness present.  Neurological:     Mental Status: She is alert and oriented to person, place, and time.    BP 138/86 (BP Location: Right Arm, Patient Position: Sitting, Cuff Size: Large)   Pulse 81   Temp (!) 97.3 F (36.3 C) (Temporal)   Resp  18   Ht 5\' 5"  (1.651 m)   Wt 200 lb 9.6 oz (91 kg)   SpO2 97%   BMI 33.38 kg/m  Wt Readings from Last 3 Encounters:  04/17/19 200 lb 9.6 oz (91 kg)  02/14/19 198 lb 6.4 oz (90 kg)  07/24/18 208 lb (94.3 kg)     Lab Results  Component Value Date   WBC 9.2 07/24/2018   HGB 11.8 (L) 07/24/2018   HCT 37.2 07/24/2018   PLT 323.0 07/24/2018   GLUCOSE 88 07/24/2018   CHOL 228 (H) 07/24/2018   TRIG 121.0 07/24/2018   HDL 50.30 07/24/2018   LDLDIRECT 161.2 07/18/2013   LDLCALC 154 (H) 07/24/2018   ALT 11 07/24/2018   AST 19 07/24/2018   NA 143 07/24/2018   K 5.3 (H) 07/24/2018   CL 105 07/24/2018   CREATININE 1.03 07/24/2018   BUN 19 07/24/2018   CO2 28 07/24/2018   TSH 2.780 10/28/2013   INR 0.99 09/09/2014   HGBA1C 5.8 07/11/2017    Dg Lumbar Spine Complete  Result Date: 01/10/2018 CLINICAL DATA:  Several month history of low back pain. No known injury. No radicular symptoms. EXAM: LUMBAR SPINE - COMPLETE 4+ VIEW COMPARISON:  Coronal and sagittal reconstructed images through the lumbar spine from an abdominal and pelvic CT scan dated May 31, 2016 FINDINGS: There is chronic mild curvature centered at L3-4 convex toward the right. The vertebral bodies are preserved in height. There is moderate degenerative disc space narrowing at L4-5 and L1-2 with mild narrowing at L2-3. There is no spondylolisthesis. There is facet joint hypertrophy at L5-S1. The pedicles are intact where visualized. There is calcification in the wall of the abdominal aorta. IMPRESSION: There is multilevel degenerative disc disease of the lumbar spine not greatly changed from the previous study. There is stable mild dextrocurvature centered at L3-4. Abdominal aortic atherosclerosis. Electronically Signed   By: Deanna  Shields M.D.   On: 01/10/2018 15:21     Assessment & Plan:  Plan  I have discontinued Deanna Shields's chlorpheniramine-HYDROcodone and cephALEXin. I am also having her start on cephALEXin.  Additionally, I am having her maintain her traZODone, loratadine, ondansetron, amLODipine, hydrochlorothiazide, and rosuvastatin.  Meds ordered this encounter  Medications  . cephALEXin (KEFLEX) 500 MG capsule    Sig: Take 1 capsule (500 mg total) by mouth 2 (two) times daily.    Dispense:  20 capsule    Refill:  0    Problem List Items Addressed This Visit    None    Visit Diagnoses    Abnormal urine    -  Primary   Relevant Orders   POCT urinalysis dipstick (Completed)   Urine Culture   Urinary tract infection without hematuria, site unspecified       Relevant Medications   cephALEXin (KEFLEX) 500 MG capsule      Follow-up: Return if symptoms worsen or fail to improve.  [name], DO

## 2019-04-19 LAB — URINE CULTURE
MICRO NUMBER:: 1084226
SPECIMEN QUALITY:: ADEQUATE

## 2019-04-27 ENCOUNTER — Other Ambulatory Visit: Payer: Self-pay

## 2019-05-10 ENCOUNTER — Other Ambulatory Visit: Payer: Self-pay | Admitting: Family Medicine

## 2019-05-10 DIAGNOSIS — N390 Urinary tract infection, site not specified: Secondary | ICD-10-CM

## 2019-05-14 NOTE — Telephone Encounter (Signed)
Patient checking on the status of cephALEXin (KEFLEX) 500 MG capsule, UTI symptom have improved slightly, patient requesting cephALEXin (KEFLEX) 500 MG capsule please send in today to   [facility], [address]

## 2019-05-14 NOTE — Telephone Encounter (Signed)
Pt will need another appt if she continues with UTI sxs.

## 2019-05-17 ENCOUNTER — Other Ambulatory Visit: Payer: Self-pay | Admitting: Family Medicine

## 2019-05-17 DIAGNOSIS — N390 Urinary tract infection, site not specified: Secondary | ICD-10-CM

## 2019-05-18 ENCOUNTER — Other Ambulatory Visit: Payer: Self-pay | Admitting: Family Medicine

## 2019-05-18 DIAGNOSIS — N390 Urinary tract infection, site not specified: Secondary | ICD-10-CM

## 2019-05-18 NOTE — Telephone Encounter (Signed)
Request came in for refill  Please advise

## 2019-06-11 DIAGNOSIS — Z23 Encounter for immunization: Secondary | ICD-10-CM | POA: Diagnosis not present

## 2019-07-09 DIAGNOSIS — Z23 Encounter for immunization: Secondary | ICD-10-CM | POA: Diagnosis not present

## 2019-07-17 ENCOUNTER — Other Ambulatory Visit: Payer: Self-pay | Admitting: Family Medicine

## 2019-07-17 DIAGNOSIS — I10 Essential (primary) hypertension: Secondary | ICD-10-CM

## 2020-03-12 DIAGNOSIS — M549 Dorsalgia, unspecified: Secondary | ICD-10-CM | POA: Diagnosis not present

## 2020-03-12 DIAGNOSIS — R2681 Unsteadiness on feet: Secondary | ICD-10-CM | POA: Diagnosis not present

## 2020-03-12 DIAGNOSIS — M6281 Muscle weakness (generalized): Secondary | ICD-10-CM | POA: Diagnosis not present

## 2020-03-12 DIAGNOSIS — J019 Acute sinusitis, unspecified: Secondary | ICD-10-CM | POA: Diagnosis not present

## 2020-03-12 DIAGNOSIS — Z9181 History of falling: Secondary | ICD-10-CM | POA: Diagnosis not present

## 2020-03-12 DIAGNOSIS — R29898 Other symptoms and signs involving the musculoskeletal system: Secondary | ICD-10-CM | POA: Diagnosis not present

## 2020-03-12 DIAGNOSIS — R609 Edema, unspecified: Secondary | ICD-10-CM | POA: Diagnosis not present

## 2020-03-14 DIAGNOSIS — R609 Edema, unspecified: Secondary | ICD-10-CM | POA: Diagnosis not present

## 2020-03-14 DIAGNOSIS — M6281 Muscle weakness (generalized): Secondary | ICD-10-CM | POA: Diagnosis not present

## 2020-03-14 DIAGNOSIS — R29898 Other symptoms and signs involving the musculoskeletal system: Secondary | ICD-10-CM | POA: Diagnosis not present

## 2020-03-14 DIAGNOSIS — Z9181 History of falling: Secondary | ICD-10-CM | POA: Diagnosis not present

## 2020-03-14 DIAGNOSIS — R2681 Unsteadiness on feet: Secondary | ICD-10-CM | POA: Diagnosis not present

## 2020-03-14 DIAGNOSIS — M549 Dorsalgia, unspecified: Secondary | ICD-10-CM | POA: Diagnosis not present

## 2020-03-17 DIAGNOSIS — R29898 Other symptoms and signs involving the musculoskeletal system: Secondary | ICD-10-CM | POA: Diagnosis not present

## 2020-03-17 DIAGNOSIS — M549 Dorsalgia, unspecified: Secondary | ICD-10-CM | POA: Diagnosis not present

## 2020-03-17 DIAGNOSIS — M6281 Muscle weakness (generalized): Secondary | ICD-10-CM | POA: Diagnosis not present

## 2020-03-17 DIAGNOSIS — Z9181 History of falling: Secondary | ICD-10-CM | POA: Diagnosis not present

## 2020-03-17 DIAGNOSIS — R2681 Unsteadiness on feet: Secondary | ICD-10-CM | POA: Diagnosis not present

## 2020-03-17 DIAGNOSIS — R609 Edema, unspecified: Secondary | ICD-10-CM | POA: Diagnosis not present

## 2020-03-19 DIAGNOSIS — R29898 Other symptoms and signs involving the musculoskeletal system: Secondary | ICD-10-CM | POA: Diagnosis not present

## 2020-03-19 DIAGNOSIS — M6281 Muscle weakness (generalized): Secondary | ICD-10-CM | POA: Diagnosis not present

## 2020-03-19 DIAGNOSIS — R2681 Unsteadiness on feet: Secondary | ICD-10-CM | POA: Diagnosis not present

## 2020-03-19 DIAGNOSIS — M549 Dorsalgia, unspecified: Secondary | ICD-10-CM | POA: Diagnosis not present

## 2020-03-19 DIAGNOSIS — R609 Edema, unspecified: Secondary | ICD-10-CM | POA: Diagnosis not present

## 2020-03-19 DIAGNOSIS — Z9181 History of falling: Secondary | ICD-10-CM | POA: Diagnosis not present

## 2020-03-21 DIAGNOSIS — Z9181 History of falling: Secondary | ICD-10-CM | POA: Diagnosis not present

## 2020-03-21 DIAGNOSIS — R2681 Unsteadiness on feet: Secondary | ICD-10-CM | POA: Diagnosis not present

## 2020-03-21 DIAGNOSIS — R609 Edema, unspecified: Secondary | ICD-10-CM | POA: Diagnosis not present

## 2020-03-21 DIAGNOSIS — M6281 Muscle weakness (generalized): Secondary | ICD-10-CM | POA: Diagnosis not present

## 2020-03-21 DIAGNOSIS — R29898 Other symptoms and signs involving the musculoskeletal system: Secondary | ICD-10-CM | POA: Diagnosis not present

## 2020-03-21 DIAGNOSIS — M549 Dorsalgia, unspecified: Secondary | ICD-10-CM | POA: Diagnosis not present

## 2020-03-25 DIAGNOSIS — M549 Dorsalgia, unspecified: Secondary | ICD-10-CM | POA: Diagnosis not present

## 2020-03-25 DIAGNOSIS — R609 Edema, unspecified: Secondary | ICD-10-CM | POA: Diagnosis not present

## 2020-03-25 DIAGNOSIS — M6281 Muscle weakness (generalized): Secondary | ICD-10-CM | POA: Diagnosis not present

## 2020-03-25 DIAGNOSIS — R29898 Other symptoms and signs involving the musculoskeletal system: Secondary | ICD-10-CM | POA: Diagnosis not present

## 2020-03-25 DIAGNOSIS — Z9181 History of falling: Secondary | ICD-10-CM | POA: Diagnosis not present

## 2020-03-25 DIAGNOSIS — R2681 Unsteadiness on feet: Secondary | ICD-10-CM | POA: Diagnosis not present

## 2020-03-26 DIAGNOSIS — Z9181 History of falling: Secondary | ICD-10-CM | POA: Diagnosis not present

## 2020-03-26 DIAGNOSIS — M6281 Muscle weakness (generalized): Secondary | ICD-10-CM | POA: Diagnosis not present

## 2020-03-26 DIAGNOSIS — R2681 Unsteadiness on feet: Secondary | ICD-10-CM | POA: Diagnosis not present

## 2020-03-26 DIAGNOSIS — R609 Edema, unspecified: Secondary | ICD-10-CM | POA: Diagnosis not present

## 2020-03-26 DIAGNOSIS — R29898 Other symptoms and signs involving the musculoskeletal system: Secondary | ICD-10-CM | POA: Diagnosis not present

## 2020-03-26 DIAGNOSIS — M549 Dorsalgia, unspecified: Secondary | ICD-10-CM | POA: Diagnosis not present

## 2020-03-28 DIAGNOSIS — R2681 Unsteadiness on feet: Secondary | ICD-10-CM | POA: Diagnosis not present

## 2020-03-28 DIAGNOSIS — Z9181 History of falling: Secondary | ICD-10-CM | POA: Diagnosis not present

## 2020-03-28 DIAGNOSIS — M549 Dorsalgia, unspecified: Secondary | ICD-10-CM | POA: Diagnosis not present

## 2020-03-28 DIAGNOSIS — M6281 Muscle weakness (generalized): Secondary | ICD-10-CM | POA: Diagnosis not present

## 2020-03-28 DIAGNOSIS — R609 Edema, unspecified: Secondary | ICD-10-CM | POA: Diagnosis not present

## 2020-03-28 DIAGNOSIS — R29898 Other symptoms and signs involving the musculoskeletal system: Secondary | ICD-10-CM | POA: Diagnosis not present

## 2020-03-31 DIAGNOSIS — R29898 Other symptoms and signs involving the musculoskeletal system: Secondary | ICD-10-CM | POA: Diagnosis not present

## 2020-03-31 DIAGNOSIS — R609 Edema, unspecified: Secondary | ICD-10-CM | POA: Diagnosis not present

## 2020-03-31 DIAGNOSIS — M6281 Muscle weakness (generalized): Secondary | ICD-10-CM | POA: Diagnosis not present

## 2020-03-31 DIAGNOSIS — Z9181 History of falling: Secondary | ICD-10-CM | POA: Diagnosis not present

## 2020-03-31 DIAGNOSIS — R2681 Unsteadiness on feet: Secondary | ICD-10-CM | POA: Diagnosis not present

## 2020-03-31 DIAGNOSIS — M549 Dorsalgia, unspecified: Secondary | ICD-10-CM | POA: Diagnosis not present

## 2020-04-01 ENCOUNTER — Ambulatory Visit: Payer: Medicare Other | Admitting: Family Medicine

## 2020-04-02 DIAGNOSIS — M6281 Muscle weakness (generalized): Secondary | ICD-10-CM | POA: Diagnosis not present

## 2020-04-02 DIAGNOSIS — Z9181 History of falling: Secondary | ICD-10-CM | POA: Diagnosis not present

## 2020-04-02 DIAGNOSIS — R29898 Other symptoms and signs involving the musculoskeletal system: Secondary | ICD-10-CM | POA: Diagnosis not present

## 2020-04-02 DIAGNOSIS — R609 Edema, unspecified: Secondary | ICD-10-CM | POA: Diagnosis not present

## 2020-04-02 DIAGNOSIS — R2681 Unsteadiness on feet: Secondary | ICD-10-CM | POA: Diagnosis not present

## 2020-04-02 DIAGNOSIS — M549 Dorsalgia, unspecified: Secondary | ICD-10-CM | POA: Diagnosis not present

## 2020-04-03 ENCOUNTER — Ambulatory Visit (INDEPENDENT_AMBULATORY_CARE_PROVIDER_SITE_OTHER): Payer: Medicare Other | Admitting: Family Medicine

## 2020-04-03 ENCOUNTER — Encounter: Payer: Self-pay | Admitting: Family Medicine

## 2020-04-03 ENCOUNTER — Other Ambulatory Visit: Payer: Self-pay

## 2020-04-03 VITALS — BP 120/70 | HR 82 | Temp 97.9°F | Resp 18 | Ht 65.0 in | Wt 205.8 lb

## 2020-04-03 DIAGNOSIS — E538 Deficiency of other specified B group vitamins: Secondary | ICD-10-CM

## 2020-04-03 DIAGNOSIS — Z87440 Personal history of urinary (tract) infections: Secondary | ICD-10-CM | POA: Diagnosis not present

## 2020-04-03 DIAGNOSIS — R8290 Unspecified abnormal findings in urine: Secondary | ICD-10-CM | POA: Diagnosis not present

## 2020-04-03 DIAGNOSIS — E785 Hyperlipidemia, unspecified: Secondary | ICD-10-CM | POA: Diagnosis not present

## 2020-04-03 DIAGNOSIS — E559 Vitamin D deficiency, unspecified: Secondary | ICD-10-CM | POA: Diagnosis not present

## 2020-04-03 DIAGNOSIS — R413 Other amnesia: Secondary | ICD-10-CM

## 2020-04-03 DIAGNOSIS — I10 Essential (primary) hypertension: Secondary | ICD-10-CM

## 2020-04-03 LAB — POC URINALSYSI DIPSTICK (AUTOMATED)
Blood, UA: NEGATIVE
Glucose, UA: NEGATIVE
Ketones, UA: NEGATIVE
Leukocytes, UA: NEGATIVE
Nitrite, UA: POSITIVE
Protein, UA: POSITIVE — AB
Spec Grav, UA: >=1.03 — AB (ref 1.010–1.025)
Urobilinogen, UA: 0.2 E.U./dL
pH, UA: 5 (ref 5.0–8.0)

## 2020-04-03 MED ORDER — HYDROCHLOROTHIAZIDE 25 MG PO TABS: 25.0000 mg | ORAL_TABLET | Freq: Every day | ORAL | 3 refills | Status: DC

## 2020-04-03 MED ORDER — ROSUVASTATIN CALCIUM 20 MG PO TABS: 20.0000 mg | ORAL_TABLET | Freq: Every day | ORAL | 3 refills | Status: DC

## 2020-04-03 MED ORDER — AMLODIPINE BESYLATE 2.5 MG PO TABS: 2.5000 mg | ORAL_TABLET | Freq: Every day | ORAL | 3 refills | Status: DC

## 2020-04-03 MED ORDER — CEPHALEXIN 500 MG PO CAPS: 500.0000 mg | ORAL_CAPSULE | Freq: Two times a day (BID) | ORAL | 0 refills | Status: DC

## 2020-04-03 NOTE — Patient Instructions (Signed)

## 2020-04-03 NOTE — Progress Notes (Signed)
Patient ID: Deanna Shields, female    DOB: [birth year]  Age: 86 y.o. MRN: [id number]    Subjective:  Subjective  HPI Deanna Shields presents for f/u htn, and chol and is having memory problems     Her niece is with her and is her PoA  poa and living will were copied and will be scanned in  FL2 needs to be filled out for assisted living    Review of Systems  Constitutional: Negative for appetite change, diaphoresis, fatigue and unexpected weight change.  Eyes: Negative for pain, redness and visual disturbance.  Respiratory: Negative for cough, chest tightness, shortness of breath and wheezing.   Cardiovascular: Negative for chest pain, palpitations and leg swelling.  Endocrine: Negative for cold intolerance, heat intolerance, polydipsia, polyphagia and polyuria.  Genitourinary: Negative for difficulty urinating, dysuria and frequency.  Neurological: Negative for dizziness, light-headedness, numbness and headaches.  Psychiatric/Behavioral: Positive for confusion and decreased concentration. Negative for self-injury, sleep disturbance and suicidal ideas.    History Past Medical History:  Diagnosis Date  . Arthritis    "right thumb" (01/29/2015)  . Chronic shoulder pain    "between my shoulders" (01/29/2015)  . Dizziness   . Esophageal stricture 2009  . Gallstones   . GERD (gastroesophageal reflux disease) 2004  . Hemorrhoids 2004  . Hiatal hernia 2004  . History of blood transfusion 1947   "appendix ruptured"  . HOH (hard of hearing)   . Hypertension   . Rhinitis, allergic   . Syncopal episodes     She has a past surgical history that includes Vaginal hysterectomy (1970's); Hemorroidectomy; Knee arthroscopy (Bilateral); Carpal tunnel release (Right, 1990's); NM MYOVIEW LTD (06/01/2012); left heart catheterization with coronary angiogram (N/A, 09/16/2014); Appendectomy (1947); Laparoscopic cholecystectomy (01/29/2015); Dilation and curettage of uterus ("couple"); Cardiac  catheterization (10/23/1999; 09/2014); Esophagogastroduodenoscopy (egd) with esophageal dilation (2-3 times); and Cholecystectomy (N/A, 01/29/2015).   Her family history includes Heart disease in her sister; Stroke (age of onset: 43) in her father.She reports that she has never smoked. She has never used smokeless tobacco. She reports that she does not drink alcohol and does not use drugs.  Current Outpatient Medications on File Prior to Visit  Medication Sig Dispense Refill  . loratadine (CLARITIN) 10 MG tablet Take 1 tablet (10 mg total) by mouth daily. 30 tablet 11   No current facility-administered medications on file prior to visit.     Objective:  Objective  Physical Exam Vitals and nursing note reviewed.  Constitutional:      General: She is not in acute distress.    Appearance: She is well-developed.  HENT:     Head: Normocephalic and atraumatic.     Right Ear: External ear normal.     Left Ear: External ear normal.     Nose: Nose normal.  Eyes:     Conjunctiva/sclera: Conjunctivae normal.     Pupils: Pupils are equal, round, and reactive to light.  Neck:     Thyroid: No thyromegaly.     Vascular: No carotid bruit or JVD.  Cardiovascular:     Rate and Rhythm: Normal rate and regular rhythm.     Heart sounds: Normal heart sounds. No murmur heard.   Pulmonary:     Effort: Pulmonary effort is normal. No respiratory distress.     Breath sounds: Normal breath sounds. No wheezing or rales.  Chest:     Chest wall: No tenderness.  Musculoskeletal:     Cervical back: Normal range of motion  and neck supple.  Neurological:     Mental Status: She is alert and oriented to person, place, and time.     Comments: mmse 23/30  Psychiatric:        Behavior: Behavior normal.        Thought Content: Thought content normal.        Judgment: Judgment normal.    BP 120/70 (BP Location: Right Arm, Patient Position: Sitting, Cuff Size: Large)   Pulse 82   Temp 97.9 F (36.6 C) (Oral)    Resp 18   Ht 5\' 5"  (1.651 m)   Wt 205 lb 12.8 oz (93.4 kg)   SpO2 98%   BMI 34.25 kg/m  Wt Readings from Last 3 Encounters:  04/03/20 205 lb 12.8 oz (93.4 kg)  04/17/19 200 lb 9.6 oz (91 kg)  02/14/19 198 lb 6.4 oz (90 kg)     Lab Results  Component Value Date   WBC 10.9 (H) 04/03/2020   HGB 12.4 04/03/2020   HCT 39.3 04/03/2020   PLT 350 04/03/2020   GLUCOSE 108 (H) 04/03/2020   CHOL 147 04/03/2020   TRIG 143 04/03/2020   HDL 51 04/03/2020   LDLDIRECT 161.2 07/18/2013   LDLCALC 73 04/03/2020   ALT 7 04/03/2020   AST 17 04/03/2020   NA 139 04/03/2020   K 3.4 (L) 04/03/2020   CL 98 04/03/2020   CREATININE 1.16 (H) 04/03/2020   BUN 22 04/03/2020   CO2 27 04/03/2020   TSH 2.55 04/03/2020   INR 0.99 09/09/2014   HGBA1C 5.8 07/11/2017    DG Lumbar Spine Complete  Result Date: 01/10/2018 CLINICAL DATA:  Several month history of low back pain. No known injury. No radicular symptoms. EXAM: LUMBAR SPINE - COMPLETE 4+ VIEW COMPARISON:  Coronal and sagittal reconstructed images through the lumbar spine from an abdominal and pelvic CT scan dated May 31, 2016 FINDINGS: There is chronic mild curvature centered at L3-4 convex toward the right. The vertebral bodies are preserved in height. There is moderate degenerative disc space narrowing at L4-5 and L1-2 with mild narrowing at L2-3. There is no spondylolisthesis. There is facet joint hypertrophy at L5-S1. The pedicles are intact where visualized. There is calcification in the wall of the abdominal aorta. IMPRESSION: There is multilevel degenerative disc disease of the lumbar spine not greatly changed from the previous study. There is stable mild dextrocurvature centered at L3-4. Abdominal aortic atherosclerosis. Electronically Signed   By: David  Martinique M.D.   On: 01/10/2018 15:21     Assessment & Plan:  Plan  I have discontinued Deanna Shields's traZODone, ondansetron, and cephALEXin. I have also changed her amLODipine and  hydrochlorothiazide. Additionally, I am having her start on cephALEXin. Lastly, I am having her maintain her loratadine and rosuvastatin.  Meds ordered this encounter  Medications  . amLODipine (NORVASC) 2.5 MG tablet    Sig: Take 1 tablet (2.5 mg total) by mouth daily.    Dispense:  90 tablet    Refill:  3  . hydrochlorothiazide (HYDRODIURIL) 25 MG tablet    Sig: Take 1 tablet (25 mg total) by mouth daily.    Dispense:  90 tablet    Refill:  3  . rosuvastatin (CRESTOR) 20 MG tablet    Sig: Take 1 tablet (20 mg total) by mouth daily.    Dispense:  90 tablet    Refill:  3  . cephALEXin (KEFLEX) 500 MG capsule    Sig: Take 1 capsule (500 mg  total) by mouth 2 (two) times daily.    Dispense:  14 capsule    Refill:  0    Problem List Items Addressed This Visit      Unprioritized   Dyslipidemia    Encouraged heart healthy diet, increase exercise, avoid trans fats, consider a krill oil cap daily      Relevant Medications   rosuvastatin (CRESTOR) 20 MG tablet   Essential hypertension (Chronic)    Well controlled, no changes to meds. Encouraged heart healthy diet such as the DASH diet and exercise as tolerated.       Relevant Medications   amLODipine (NORVASC) 2.5 MG tablet   hydrochlorothiazide (HYDRODIURIL) 25 MG tablet   rosuvastatin (CRESTOR) 20 MG tablet   Other Relevant Orders   Lipid panel (Completed)   Comprehensive metabolic panel (Completed)   CBC with Differential/Platelet (Completed)   TSH (Completed)   Vitamin B12   Memory loss    mmse 23/30 Consider neuro psych referral       Relevant Orders   Lipid panel (Completed)   Comprehensive metabolic panel (Completed)   CBC with Differential/Platelet (Completed)   TSH (Completed)   Vitamin B12    Other Visit Diagnoses    Hyperlipidemia, unspecified hyperlipidemia type    -  Primary   Relevant Medications   amLODipine (NORVASC) 2.5 MG tablet   hydrochlorothiazide (HYDRODIURIL) 25 MG tablet   rosuvastatin  (CRESTOR) 20 MG tablet   Other Relevant Orders   Lipid panel (Completed)   Comprehensive metabolic panel (Completed)   CBC with Differential/Platelet (Completed)   TSH (Completed)   Vitamin B12   History of UTI       Relevant Medications   cephALEXin (KEFLEX) 500 MG capsule   Other Relevant Orders   POCT Urinalysis Dipstick (Automated) (Completed)   Vitamin B12 deficiency       Relevant Orders   Vitamin B12 (Completed)   Vitamin D deficiency       Relevant Orders   VITAMIN D 25 Hydroxy (Vit-D Deficiency, Fractures) (Completed)   Abnormal urine finding       Relevant Orders   Urine Culture (Completed)      Follow-up: Return in about 6 months (around 10/02/2020), or if symptoms worsen or fail to improve, for hypertension, hyperlipidemia.  [name], DO

## 2020-04-04 DIAGNOSIS — R609 Edema, unspecified: Secondary | ICD-10-CM | POA: Diagnosis not present

## 2020-04-04 DIAGNOSIS — R2681 Unsteadiness on feet: Secondary | ICD-10-CM | POA: Diagnosis not present

## 2020-04-04 DIAGNOSIS — M6281 Muscle weakness (generalized): Secondary | ICD-10-CM | POA: Diagnosis not present

## 2020-04-04 DIAGNOSIS — M549 Dorsalgia, unspecified: Secondary | ICD-10-CM | POA: Diagnosis not present

## 2020-04-04 DIAGNOSIS — R29898 Other symptoms and signs involving the musculoskeletal system: Secondary | ICD-10-CM | POA: Diagnosis not present

## 2020-04-04 DIAGNOSIS — Z9181 History of falling: Secondary | ICD-10-CM | POA: Diagnosis not present

## 2020-04-04 LAB — COMPREHENSIVE METABOLIC PANEL
AG Ratio: 1.8 (calc) (ref 1.0–2.5)
ALT: 7 U/L (ref 6–29)
AST: 17 U/L (ref 10–35)
Albumin: 4.5 g/dL (ref 3.6–5.1)
Alkaline phosphatase (APISO): 83 U/L (ref 37–153)
BUN/Creatinine Ratio: 19 (calc) (ref 6–22)
BUN: 22 mg/dL (ref 7–25)
CO2: 27 mmol/L (ref 20–32)
Calcium: 9.9 mg/dL (ref 8.6–10.4)
Chloride: 98 mmol/L (ref 98–110)
Creat: 1.16 mg/dL — ABNORMAL HIGH (ref 0.60–0.88)
Globulin: 2.5 g/dL (calc) (ref 1.9–3.7)
Glucose, Bld: 108 mg/dL — ABNORMAL HIGH (ref 65–99)
Potassium: 3.4 mmol/L — ABNORMAL LOW (ref 3.5–5.3)
Sodium: 139 mmol/L (ref 135–146)
Total Bilirubin: 0.9 mg/dL (ref 0.2–1.2)
Total Protein: 7 g/dL (ref 6.1–8.1)

## 2020-04-04 LAB — URINE CULTURE
MICRO NUMBER:: 11131348
SPECIMEN QUALITY:: ADEQUATE

## 2020-04-04 LAB — CBC WITH DIFFERENTIAL/PLATELET
Absolute Monocytes: 774 cells/uL (ref 200–950)
Basophils Absolute: 65 cells/uL (ref 0–200)
Basophils Relative: 0.6 %
Eosinophils Absolute: 273 cells/uL (ref 15–500)
Eosinophils Relative: 2.5 %
HCT: 39.3 % (ref 35.0–45.0)
Hemoglobin: 12.4 g/dL (ref 11.7–15.5)
Lymphs Abs: 3281 cells/uL (ref 850–3900)
MCH: 27 pg (ref 27.0–33.0)
MCHC: 31.6 g/dL — ABNORMAL LOW (ref 32.0–36.0)
MCV: 85.6 fL (ref 80.0–100.0)
MPV: 9.9 fL (ref 7.5–12.5)
Monocytes Relative: 7.1 %
Neutro Abs: 6507 cells/uL (ref 1500–7800)
Neutrophils Relative %: 59.7 %
Platelets: 350 10*3/uL (ref 140–400)
RBC: 4.59 10*6/uL (ref 3.80–5.10)
RDW: 17 % — ABNORMAL HIGH (ref 11.0–15.0)
Total Lymphocyte: 30.1 %
WBC: 10.9 10*3/uL — ABNORMAL HIGH (ref 3.8–10.8)

## 2020-04-04 LAB — LIPID PANEL
Cholesterol: 147 mg/dL (ref <200)
HDL: 51 mg/dL (ref >=50)
LDL Cholesterol (Calc): 73 mg/dL (calc)
Non-HDL Cholesterol (Calc): 96 mg/dL (calc) (ref <130)
Total CHOL/HDL Ratio: 2.9 (calc) (ref <5.0)
Triglycerides: 143 mg/dL (ref <150)

## 2020-04-04 LAB — VITAMIN B12: Vitamin B-12: 228 pg/mL (ref 200–1100)

## 2020-04-04 LAB — TSH: TSH: 2.55 mIU/L (ref 0.40–4.50)

## 2020-04-04 LAB — VITAMIN D 25 HYDROXY (VIT D DEFICIENCY, FRACTURES): Vit D, 25-Hydroxy: 24 ng/mL — ABNORMAL LOW (ref 30–100)

## 2020-04-06 DIAGNOSIS — R413 Other amnesia: Secondary | ICD-10-CM | POA: Insufficient documentation

## 2020-04-06 NOTE — Assessment & Plan Note (Signed)
Encouraged heart healthy diet, increase exercise, avoid trans fats, consider a krill oil cap daily 

## 2020-04-06 NOTE — Assessment & Plan Note (Signed)
mmse 23/30 Consider neuro psych referral

## 2020-04-06 NOTE — Assessment & Plan Note (Signed)
Well controlled, no changes to meds. Encouraged heart healthy diet such as the DASH diet and exercise as tolerated.  °

## 2020-04-07 DIAGNOSIS — Z9181 History of falling: Secondary | ICD-10-CM | POA: Diagnosis not present

## 2020-04-07 DIAGNOSIS — R29898 Other symptoms and signs involving the musculoskeletal system: Secondary | ICD-10-CM | POA: Diagnosis not present

## 2020-04-07 DIAGNOSIS — M6281 Muscle weakness (generalized): Secondary | ICD-10-CM | POA: Diagnosis not present

## 2020-04-07 DIAGNOSIS — R2681 Unsteadiness on feet: Secondary | ICD-10-CM | POA: Diagnosis not present

## 2020-04-08 ENCOUNTER — Other Ambulatory Visit: Payer: Self-pay | Admitting: *Deleted

## 2020-04-08 DIAGNOSIS — E876 Hypokalemia: Secondary | ICD-10-CM

## 2020-04-08 MED ORDER — POTASSIUM CHLORIDE ER 10 MEQ PO TBCR: 10.0000 meq | EXTENDED_RELEASE_TABLET | Freq: Every day | ORAL | 2 refills | Status: DC

## 2020-04-09 DIAGNOSIS — Z9181 History of falling: Secondary | ICD-10-CM | POA: Diagnosis not present

## 2020-04-09 DIAGNOSIS — R29898 Other symptoms and signs involving the musculoskeletal system: Secondary | ICD-10-CM | POA: Diagnosis not present

## 2020-04-09 DIAGNOSIS — M6281 Muscle weakness (generalized): Secondary | ICD-10-CM | POA: Diagnosis not present

## 2020-04-09 DIAGNOSIS — R2681 Unsteadiness on feet: Secondary | ICD-10-CM | POA: Diagnosis not present

## 2020-04-11 DIAGNOSIS — Z9181 History of falling: Secondary | ICD-10-CM | POA: Diagnosis not present

## 2020-04-11 DIAGNOSIS — M6281 Muscle weakness (generalized): Secondary | ICD-10-CM | POA: Diagnosis not present

## 2020-04-11 DIAGNOSIS — R29898 Other symptoms and signs involving the musculoskeletal system: Secondary | ICD-10-CM | POA: Diagnosis not present

## 2020-04-11 DIAGNOSIS — R2681 Unsteadiness on feet: Secondary | ICD-10-CM | POA: Diagnosis not present

## 2020-04-14 DIAGNOSIS — M6281 Muscle weakness (generalized): Secondary | ICD-10-CM | POA: Diagnosis not present

## 2020-04-14 DIAGNOSIS — R29898 Other symptoms and signs involving the musculoskeletal system: Secondary | ICD-10-CM | POA: Diagnosis not present

## 2020-04-14 DIAGNOSIS — R2681 Unsteadiness on feet: Secondary | ICD-10-CM | POA: Diagnosis not present

## 2020-04-14 DIAGNOSIS — Z9181 History of falling: Secondary | ICD-10-CM | POA: Diagnosis not present

## 2020-04-21 ENCOUNTER — Telehealth: Payer: Self-pay | Admitting: Family Medicine

## 2020-04-21 DIAGNOSIS — E876 Hypokalemia: Secondary | ICD-10-CM

## 2020-04-21 NOTE — Telephone Encounter (Signed)
Deanna Shields from [facility] called stating they need a Potassium order faxed to  [phone]

## 2020-04-22 ENCOUNTER — Non-Acute Institutional Stay: Payer: Medicare Other | Admitting: Internal Medicine

## 2020-04-22 ENCOUNTER — Encounter: Payer: Self-pay | Admitting: Internal Medicine

## 2020-04-22 DIAGNOSIS — E876 Hypokalemia: Secondary | ICD-10-CM

## 2020-04-22 DIAGNOSIS — E559 Vitamin D deficiency, unspecified: Secondary | ICD-10-CM

## 2020-04-22 DIAGNOSIS — I10 Essential (primary) hypertension: Secondary | ICD-10-CM | POA: Diagnosis not present

## 2020-04-22 DIAGNOSIS — E785 Hyperlipidemia, unspecified: Secondary | ICD-10-CM

## 2020-04-22 DIAGNOSIS — F0390 Unspecified dementia without behavioral disturbance: Secondary | ICD-10-CM

## 2020-04-22 NOTE — Telephone Encounter (Signed)
Ok to fax order

## 2020-04-22 NOTE — Progress Notes (Signed)
Provider:  Veleta Miners, MD Location:  [place] Room Number: 332-R Place of Service:  [phone])  PCP: [name], DO Patient Care Team: [name], DO as PCP - General [name] Pearletha Forge, MD as Consulting Physician (Cardiology)  Extended Emergency Contact Information Primary Emergency Contact: Osborne Casco Address: [place], [place] [address] [place] Phone: [phone] Mobile Phone: [phone] Relation: Niece  Code Status:  FULL CODE Goals of Care: Advanced Directive information Advanced Directives 11/21/2016  Does Patient Have a Medical Advance Directive? No  Type of Advance Directive -  Does patient want to make changes to medical advance directive? -  Copy of [place] in Chart? -  Would patient like information on creating a medical advance directive? No - Patient declined  Pre-existing out of facility DNR order (yellow form or pink MOST form) -      Chief Complaint  Patient presents with  . New Admit To SNF    New admission to [facility]    HPI: Patient is an 86 y.o. female seen today for admission to [facility] from her Apartment  Patient has a history of hypertension, hyperlipidemia, dementia, B12 deficiency  Patient is now moving to [place] is unable to take care of things in her apartment. Did not have any acute complaints today Is adjusting to the new room She walks with a walker. Was pleasantly confused.  Past Medical History:  Diagnosis Date  . Arthritis    "right thumb" (01/29/2015)  . Chronic shoulder pain    "between my shoulders" (01/29/2015)  . Dizziness   . Esophageal stricture 2009  . Gallstones   . GERD (gastroesophageal reflux disease) 2004  . Hemorrhoids 2004  . Hiatal hernia 2004  . History of blood transfusion 1947   "appendix ruptured"  . HOH (hard of hearing)   . Hypertension   . Rhinitis, allergic   . Syncopal  episodes    Past Surgical History:  Procedure Laterality Date  . APPENDECTOMY  1947   "ruptured"  . CARDIAC CATHETERIZATION  10/23/1999; 09/2014   EF 60% No significant CAD; patent coronary arteries.  . CARPAL TUNNEL RELEASE Right 1990's  . CHOLECYSTECTOMY N/A 01/29/2015   Procedure: LAPAROSCOPIC CHOLECYSTECTOMY;  Surgeon: Rolm Bookbinder, MD;  Location: [facility];  Service: General;  Laterality: N/A;  . DILATION AND CURETTAGE OF UTERUS  "couple"  . ESOPHAGOGASTRODUODENOSCOPY (EGD) WITH ESOPHAGEAL DILATION  2-3 times  . HEMORROIDECTOMY    . KNEE ARTHROSCOPY Bilateral    right x 2  . LAPAROSCOPIC CHOLECYSTECTOMY  01/29/2015  . LEFT HEART CATHETERIZATION WITH CORONARY ANGIOGRAM N/A 09/16/2014   Procedure: LEFT HEART CATHETERIZATION WITH CORONARY ANGIOGRAM;  Surgeon: [name], MD;  Location: [facility] CATH LAB;  Service: Cardiovascular;  Laterality: N/A;  . NM MYOVIEW LTD  06/01/2012   EF 71%  . VAGINAL HYSTERECTOMY  1970's    reports that she has never smoked. She has never used smokeless tobacco. She reports that she does not drink alcohol and does not use drugs. Social History   Socioeconomic History  . Marital status: Widowed    Spouse name: Not on file  . Number of children: 0  . Years of education: Not on file  . Highest education level: Not on file  Occupational History  . 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 Smoking status: Never Smoker  . Smokeless tobacco: Never Used  Vaping Use  . Vaping Use: Never used  Substance and Sexual Activity  . Alcohol use: No    Alcohol/week: 0.0 standard drinks  . Drug use: No  . Sexual activity: Never  Other Topics Concern  . Not on file  Social History Narrative   Patient does not consume alcohol or use tobacco products.   She does drink/eat things with caffeine in it.   Marital status: widow (married in [place])   Patient lives in an assisted living 1 story apartment home   Highest  level of education completed was high school.   Past profession: home maker   Exercise: walk 3 x week   Patient has a POA and living   Social Determinants of Radio broadcast assistant Strain:   . Difficulty of Paying Living Expenses: Not on file  Food Insecurity:   . Worried About Charity fundraiser in the Last Year: Not on file  . Ran Out of Food in the Last Year: Not on file  Transportation Needs:   . Lack of Transportation (Medical): Not on file  . Lack of Transportation (Non-Medical): Not on file  Physical Activity:   . Days of Exercise per Week: Not on file  . Minutes of Exercise per Session: Not on file  Stress:   . Feeling of Stress : Not on file  Social Connections:   . Frequency of Communication with Friends and Family: Not on file  . Frequency of Social Gatherings with Friends and Family: Not on file  . Attends Religious Services: Not on file  . Active Member of Clubs or Organizations: Not on file  . Attends Archivist Meetings: Not on file  . Marital Status: Not on file  Intimate Partner Violence:   . Fear of Current or Ex-Partner: Not on file  . Emotionally Abused: Not on file  . Physically Abused: Not on file  . Sexually Abused: Not on file    Functional Status Survey:    Family History  Problem Relation Age of Onset  . Stroke Father 27       light stroke  . Heart disease Sister   . COPD Sister   . Heart disease Brother   . Coronary artery disease Neg Hx   . Diabetes Neg Hx   . Cancer Neg Hx        breast, colon, prostate    Health Maintenance  Topic Date Due  . MAMMOGRAM  09/15/2013  . TETANUS/TDAP  12/21/2015  . INFLUENZA VACCINE  Completed  . DEXA SCAN  Completed  . COVID-19 Vaccine  Completed  . PNA vac Low Risk Adult  Completed    Allergies  Allergen Reactions  . Contrast Media [Iodinated Diagnostic Agents] Itching and Rash    Pt covered in rash, red.  . Codeine Nausea And Vomiting    in large doses: can tolerate  Tussionex    Outpatient Encounter Medications as of 04/22/2020  Medication Sig  . amLODipine (NORVASC) 2.5 MG tablet Take 1 tablet (2.5 mg total) by mouth daily.  . hydrochlorothiazide (HYDRODIURIL) 25 MG tablet Take 1 tablet (25 mg total) by mouth daily.  . rosuvastatin (CRESTOR) 20 MG tablet Take 1 tablet (20 mg total) by mouth daily.   No facility-administered encounter medications on file as of 04/22/2020.    Review of Systems Review of Systems  Constitutional: Negative for activity change,  appetite change, chills, diaphoresis, fatigue and fever.  HENT: Negative for mouth sores, postnasal drip, rhinorrhea, sinus pain and sore throat.   Respiratory: Negative for apnea, cough, chest tightness, shortness of breath and wheezing.   Cardiovascular: Negative for chest pain, palpitations and leg swelling.  Gastrointestinal: Negative for abdominal distention, abdominal pain, constipation, diarrhea, nausea and vomiting.  Genitourinary: Negative for dysuria and frequency.  Musculoskeletal: Negative for arthralgias, joint swelling and myalgias.  Skin: Negative for rash.  Neurological: Negative for dizziness, syncope, weakness, light-headedness and numbness.  Psychiatric/Behavioral: Negative for behavioral problems, confusion and sleep disturbance.     Vitals:   04/22/20 1215  BP: 124/72  Pulse: 73  Resp: 20  Temp: (!) 97.4 F (36.3 C)  TempSrc: Oral  SpO2: 97%  Weight: 205 lb 12.8 oz (93.4 kg)  Height: 5\' 5"  (1.651 m)   Body mass index is 34.25 kg/m. Physical Exam  Constitutional:  Well-developed and well-nourished.  HENT:  Head: Normocephalic.  Mouth/Throat: Oropharynx is clear and moist.  Eyes: Pupils are equal, round, and reactive to light.  Neck: Neck supple.  Cardiovascular: Normal rate and normal heart sounds.  No murmur heard. Pulmonary/Chest: Effort normal and breath sounds normal. No respiratory distress. No wheezes. She has no rales.  Abdominal: Soft. Bowel sounds  are normal. No distension. There is no tenderness. There is no rebound.  Musculoskeletal:Mild edema bilateral Lymphadenopathy: none Neurological: Alert and oriented to person, place, and time.  Skin: Skin is warm and dry.  Psychiatric: Normal mood and affect. Behavior is normal. Thought content normal.    Labs reviewed: Basic Metabolic Panel: Recent Labs    04/03/20 1211  NA 139  K 3.4*  CL 98  CO2 27  GLUCOSE 108*  BUN 22  CREATININE 1.16*  CALCIUM 9.9   Liver Function Tests: Recent Labs    04/03/20 1211  AST 17  ALT 7  BILITOT 0.9  PROT 7.0   No results for input(s): LIPASE, AMYLASE in the last 8760 hours. No results for input(s): AMMONIA in the last 8760 hours. CBC: Recent Labs    04/03/20 1211  WBC 10.9*  NEUTROABS 6,507  HGB 12.4  HCT 39.3  MCV 85.6  PLT 350   Cardiac Enzymes: No results for input(s): CKTOTAL, CKMB, CKMBINDEX, TROPONINI in the last 8760 hours. BNP: Invalid input(s): POCBNP Lab Results  Component Value Date   HGBA1C 5.8 07/11/2017   Lab Results  Component Value Date   TSH 2.55 04/03/2020   Lab Results  Component Value Date   VITAMINB12 228 04/03/2020   Lab Results  Component Value Date   FOLATE 8.4 04/13/2010   Lab Results  Component Value Date   IRON 25 (L) 07/18/2013   FERRITIN 9.6 (L) 07/18/2013    Imaging and Procedures obtained prior to SNF admission: DG Lumbar Spine Complete  Result Date: 01/10/2018 CLINICAL DATA:  Several month history of low back pain. No known injury. No radicular symptoms. EXAM: LUMBAR SPINE - COMPLETE 4+ VIEW COMPARISON:  Coronal and sagittal reconstructed images through the lumbar spine from an abdominal and pelvic CT scan dated May 31, 2016 FINDINGS: There is chronic mild curvature centered at L3-4 convex toward the right. The vertebral bodies are preserved in height. There is moderate degenerative disc space narrowing at L4-5 and L1-2 with mild narrowing at L2-3. There is no  spondylolisthesis. There is facet joint hypertrophy at L5-S1. The pedicles are intact where visualized. There is calcification in the wall of the abdominal aorta. IMPRESSION: There  is multilevel degenerative disc disease of the lumbar spine not greatly changed from the previous study. There is stable mild dextrocurvature centered at L3-4. Abdominal aortic atherosclerosis. Electronically Signed   By: David  Martinique M.D.   On: 01/10/2018 15:21    Assessment/Plan   Essential hypertension Continue on hydrochlorothiazide We will repeat BMP to follow the potassium Dyslipidemia On Crestor Last LDL was 73 in 1021 Dementia without behavioral disturbance, unspecified dementia type ([place]) MMSE 23 out of 30 per her previous PCP We will repeat it in the facility Patient has now moved to assisted living staying independent in her ADLs Will continue providing supportive care Vitamin D deficiency Last level was 24 we will restart her on vitamin D Hypokalemia Patient stopped taking her potassium Repeat BMP   Family/ staff Communication:   Labs/tests ordered: BMP

## 2020-04-22 NOTE — Telephone Encounter (Signed)
Please advise 

## 2020-04-23 NOTE — Telephone Encounter (Signed)
Order faxed.

## 2020-04-24 DIAGNOSIS — I10 Essential (primary) hypertension: Secondary | ICD-10-CM | POA: Diagnosis not present

## 2020-05-07 DIAGNOSIS — R29898 Other symptoms and signs involving the musculoskeletal system: Secondary | ICD-10-CM | POA: Diagnosis not present

## 2020-05-07 DIAGNOSIS — Z9181 History of falling: Secondary | ICD-10-CM | POA: Diagnosis not present

## 2020-05-07 DIAGNOSIS — R2681 Unsteadiness on feet: Secondary | ICD-10-CM | POA: Diagnosis not present

## 2020-05-08 DIAGNOSIS — E876 Hypokalemia: Secondary | ICD-10-CM | POA: Diagnosis not present

## 2020-05-09 LAB — BASIC METABOLIC PANEL
BUN: 21 (ref 4–21)
CO2: 24 — AB (ref 13–22)
Chloride: 103 (ref 99–108)
Creatinine: 1.2 — AB (ref 0.5–1.1)
Glucose: 145
Potassium: 4 (ref 3.4–5.3)
Sodium: 140 (ref 137–147)

## 2020-05-09 LAB — COMPREHENSIVE METABOLIC PANEL
Calcium: 9 (ref 8.7–10.7)
GFR calc Af Amer: 48
GFR calc non Af Amer: 41

## 2020-05-15 ENCOUNTER — Encounter: Payer: Medicare Other | Admitting: Nurse Practitioner

## 2020-05-15 ENCOUNTER — Other Ambulatory Visit: Payer: Self-pay

## 2020-05-15 DIAGNOSIS — I10 Essential (primary) hypertension: Secondary | ICD-10-CM

## 2020-05-15 DIAGNOSIS — E559 Vitamin D deficiency, unspecified: Secondary | ICD-10-CM

## 2020-05-15 DIAGNOSIS — E785 Hyperlipidemia, unspecified: Secondary | ICD-10-CM

## 2020-05-15 DIAGNOSIS — R413 Other amnesia: Secondary | ICD-10-CM

## 2020-05-15 DIAGNOSIS — E876 Hypokalemia: Secondary | ICD-10-CM | POA: Insufficient documentation

## 2020-05-15 NOTE — Assessment & Plan Note (Addendum)
HTN, takes HCTZ, Bun/creat 21/1/2 05/09/20, continue Amlodipine.

## 2020-05-15 NOTE — Assessment & Plan Note (Signed)
Dementia, MMSE 23/30 [facility]

## 2020-05-15 NOTE — Assessment & Plan Note (Signed)
Vit D deficiency, takes Vit D, last Vit D level 24

## 2020-05-15 NOTE — Assessment & Plan Note (Signed)
Hyperlipidemia, takes Crestor. LDL 73 04/03/20

## 2020-05-15 NOTE — Assessment & Plan Note (Signed)
Hypokalemia, serum K 4.0 05/09/20

## 2020-05-15 NOTE — Progress Notes (Signed)
This encounter was created in error - please disregard.

## 2020-06-24 ENCOUNTER — Encounter: Payer: Self-pay | Admitting: Internal Medicine

## 2020-06-24 ENCOUNTER — Non-Acute Institutional Stay: Payer: Medicare Other | Admitting: Internal Medicine

## 2020-06-24 DIAGNOSIS — I10 Essential (primary) hypertension: Secondary | ICD-10-CM | POA: Diagnosis not present

## 2020-06-24 DIAGNOSIS — E876 Hypokalemia: Secondary | ICD-10-CM

## 2020-06-24 DIAGNOSIS — F0390 Unspecified dementia without behavioral disturbance: Secondary | ICD-10-CM | POA: Diagnosis not present

## 2020-06-24 DIAGNOSIS — E559 Vitamin D deficiency, unspecified: Secondary | ICD-10-CM | POA: Diagnosis not present

## 2020-06-24 DIAGNOSIS — R600 Localized edema: Secondary | ICD-10-CM | POA: Diagnosis not present

## 2020-06-24 DIAGNOSIS — E785 Hyperlipidemia, unspecified: Secondary | ICD-10-CM

## 2020-06-24 NOTE — Progress Notes (Signed)
Location: [facility] Room Number: [id number] Place of Service:  ALF (13)  Provider:   Code Status:  Goals of Care:  Advanced Directives 04/22/2020  Does Patient Have a Medical Advance Directive? Yes  Type of Paramedic of [place];Living will  Does patient want to make changes to medical advance directive? No - Patient declined  Copy of [place] in Chart? Yes - validated most recent copy scanned in chart (See row information)  Would patient like information on creating a medical advance directive? -  Pre-existing out of facility DNR order (yellow form or pink MOST form) -     Chief Complaint  Patient presents with  . Acute Visit    LE edema    HPI: Patient is a 87 y.o. female seen today for an acute visit for Bilateral Edema  Patient has a history of hypertension, hyperlipidemia, dementia, B12 deficiency  Patient is recent move to [place] from her apartment She walks with her walker. Very pleasantly confused Nurses have noticed Worsening swelling in her LE SOB With exertion but no Cough Chest pain or PND Weight seemed stable Appetite is good   Past Medical History:  Diagnosis Date  . Arthritis    "right thumb" (01/29/2015)  . Chronic shoulder pain    "between my shoulders" (01/29/2015)  . Dizziness   . Esophageal stricture 2009  . Gallstones   . GERD (gastroesophageal reflux disease) 2004  . Hemorrhoids 2004  . Hiatal hernia 2004  . History of blood transfusion 1947   "appendix ruptured"  . HOH (hard of hearing)   . Hypertension   . Rhinitis, allergic   . Syncopal episodes     Past Surgical History:  Procedure Laterality Date  . APPENDECTOMY  1947   "ruptured"  . CARDIAC CATHETERIZATION  10/23/1999; 09/2014   EF 60% No significant CAD; patent coronary arteries.  . CARPAL TUNNEL RELEASE Right 1990's  . CHOLECYSTECTOMY N/A 01/29/2015   Procedure: LAPAROSCOPIC CHOLECYSTECTOMY;  Surgeon: Rolm Bookbinder, MD;  Location: [facility];  Service: General;  Laterality: N/A;  . DILATION AND CURETTAGE OF UTERUS  "couple"  . ESOPHAGOGASTRODUODENOSCOPY (EGD) WITH ESOPHAGEAL DILATION  2-3 times  . HEMORROIDECTOMY    . KNEE ARTHROSCOPY Bilateral    right x 2  . LAPAROSCOPIC CHOLECYSTECTOMY  01/29/2015  . LEFT HEART CATHETERIZATION WITH CORONARY ANGIOGRAM N/A 09/16/2014   Procedure: LEFT HEART CATHETERIZATION WITH CORONARY ANGIOGRAM;  Surgeon: [name], MD;  Location: [facility] CATH LAB;  Service: Cardiovascular;  Laterality: N/A;  . NM MYOVIEW LTD  06/01/2012   EF 71%  . VAGINAL HYSTERECTOMY  1970's    Allergies  Allergen Reactions  . Contrast Media [Iodinated Diagnostic Agents] Itching and Rash    Pt covered in rash, red.  . Codeine Nausea And Vomiting    in large doses: can tolerate Tussionex    Outpatient Encounter Medications as of 06/24/2020  Medication Sig  . acetaminophen (TYLENOL) 325 MG tablet Take 650 mg by mouth every 4 (four) hours as needed.  [name] amLODipine (NORVASC) 2.5 MG tablet Take 1 tablet (2.5 mg total) by mouth daily.  . Calcium Carb-Cholecalciferol (HM CALCIUM-VITAMIN D) 600-800 MG-UNIT TABS Take 1 tablet by mouth 2 (two) times daily.  . hydrochlorothiazide (HYDRODIURIL) 25 MG tablet Take 1 tablet (25 mg total) by mouth daily.  . potassium chloride SA (KLOR-CON) 20 MEQ tablet Take 20 mEq by mouth daily.  . rosuvastatin (CRESTOR) 20 MG tablet Take 1 tablet (  20 mg total) by mouth daily.   No facility-administered encounter medications on file as of 06/24/2020.    Review of Systems:  Review of Systems  Review of Systems  Constitutional: Negative for activity change, appetite change, chills, diaphoresis, fatigue and fever.  HENT: Negative for mouth sores, postnasal drip, rhinorrhea, sinus pain and sore throat.   Respiratory: Negative for apnea, cough, chest tightness, shortness of breath and wheezing.   Cardiovascular: Negative for chest pain, palpitations    Gastrointestinal: Negative for abdominal distention, abdominal pain, constipation, diarrhea, nausea and vomiting.  Genitourinary: Negative for dysuria and frequency.  Musculoskeletal: Negative for arthralgias, joint swelling and myalgias.  Skin: Negative for rash.  Neurological: Negative for dizziness, syncope, weakness, light-headedness and numbness.  Psychiatric/Behavioral: Negative for behavioral problems, confusion and sleep disturbance.     Health Maintenance  Topic Date Due  . MAMMOGRAM  09/15/2013  . TETANUS/TDAP  12/21/2015  . COVID-19 Vaccine (3 - Booster for Moderna series) 01/06/2020  . INFLUENZA VACCINE  Completed  . DEXA SCAN  Completed  . PNA vac Low Risk Adult  Completed    Physical Exam: Vitals:   06/24/20 1522  BP: 140/74  Pulse: 76  Resp: 20  Temp: 98.8 F (37.1 C)  SpO2: 95%  Weight: 205 lb (93 kg)  Height: 5' (1.524 m)   Body mass index is 40.04 kg/m. Physical Exam  Constitutional:  Well-developed and well-nourished.  HENT:  Head: Normocephalic.  Mouth/Throat: Oropharynx is clear and moist.  Eyes: Pupils are equal, round, and reactive to light.  Neck: Neck supple.  Cardiovascular: Normal rate and normal heart sounds.  No murmur heard. Pulmonary/Chest: Effort normal and breath sounds normal. No respiratory distress. No wheezes. She has no rales.  Abdominal: Soft. Bowel sounds are normal. No distension. There is no tenderness. There is no rebound.  Musculoskeletal: Chronic Venous changes Bilateral with Edema Lymphadenopathy: none Neurological: Walks with her walker Skin: Skin is warm and dry.  Psychiatric: Normal mood and affect. Behavior is normal. Thought content normal.   Labs reviewed: Basic Metabolic Panel: Recent Labs    04/03/20 1211 05/09/20 0000  NA 139 140  K 3.4* 4.0  CL 98 103  CO2 27 24*  GLUCOSE 108*  --   BUN 22 21  CREATININE 1.16* 1.2*  CALCIUM 9.9 9.0  TSH 2.55  --    Liver Function Tests: Recent Labs     04/03/20 1211  AST 17  ALT 7  BILITOT 0.9  PROT 7.0   No results for input(s): LIPASE, AMYLASE in the last 8760 hours. No results for input(s): AMMONIA in the last 8760 hours. CBC: Recent Labs    04/03/20 1211  WBC 10.9*  NEUTROABS 6,507  HGB 12.4  HCT 39.3  MCV 85.6  PLT 350   Lipid Panel: Recent Labs    04/03/20 1211  CHOL 147  HDL 51  LDLCALC 73  TRIG 143  CHOLHDL 2.9   Lab Results  Component Value Date   HGBA1C 5.8 07/11/2017    Procedures since last visit: No results found.  Assessment/Plan Bilateral leg edema Lasix 20 mg QD for 1 week and then 3/week Discontinue HCTZ Essential hypertension Discontinue HCTZ Starting on Lasix Chseck BP QD for 2 weeks If elevated consider Ace inhibitor Dyslipidemia On Crestor LDL 73 Dementia without behavioral disturbance, unspecified dementia type ([place]) Check MMSE MMSE 23 out of 30 per her previous PCP Patient has now moved to assisted living staying independent in her ADLs Will continue providing  supportive care Vitamin D deficiency Last level was 24 we will restart her on vitamin D Hypokalemia On Supplement Repeat BMP   Labs/tests ordered: BMP in 1 week

## 2020-07-05 DIAGNOSIS — R410 Disorientation, unspecified: Secondary | ICD-10-CM | POA: Diagnosis not present

## 2020-07-05 DIAGNOSIS — R509 Fever, unspecified: Secondary | ICD-10-CM | POA: Diagnosis not present

## 2020-07-05 DIAGNOSIS — I10 Essential (primary) hypertension: Secondary | ICD-10-CM | POA: Diagnosis not present

## 2020-07-05 DIAGNOSIS — N390 Urinary tract infection, site not specified: Secondary | ICD-10-CM | POA: Diagnosis not present

## 2020-07-05 LAB — CBC AND DIFFERENTIAL
HCT: 32 — AB (ref 36–46)
Hemoglobin: 10.3 — AB (ref 12.0–16.0)
Neutrophils Absolute: 7134
Platelets: 264 (ref 150–399)
WBC: 11.2

## 2020-07-05 LAB — BASIC METABOLIC PANEL WITH GFR
BUN: 18 (ref 4–21)
CO2: 28 — AB (ref 13–22)
Chloride: 104 (ref 99–108)
Creatinine: 1 (ref 0.5–1.1)
Glucose: 110
Potassium: 4.4 (ref 3.4–5.3)
Sodium: 137 (ref 137–147)

## 2020-07-05 LAB — CBC: RBC: 3.86 — AB (ref 3.87–5.11)

## 2020-07-05 LAB — COMPREHENSIVE METABOLIC PANEL: Calcium: 8.8 (ref 8.7–10.7)

## 2020-07-07 ENCOUNTER — Encounter: Payer: Self-pay | Admitting: Internal Medicine

## 2020-07-07 ENCOUNTER — Non-Acute Institutional Stay: Payer: Medicare Other | Admitting: Internal Medicine

## 2020-07-07 DIAGNOSIS — E785 Hyperlipidemia, unspecified: Secondary | ICD-10-CM | POA: Diagnosis not present

## 2020-07-07 DIAGNOSIS — F0390 Unspecified dementia without behavioral disturbance: Secondary | ICD-10-CM | POA: Diagnosis not present

## 2020-07-07 DIAGNOSIS — R600 Localized edema: Secondary | ICD-10-CM

## 2020-07-07 DIAGNOSIS — N3000 Acute cystitis without hematuria: Secondary | ICD-10-CM | POA: Diagnosis not present

## 2020-07-07 NOTE — Progress Notes (Signed)
Location:   [facility] Room Number: [place] of Service:  ALF [phone]) Provider:  Veleta Miners MD  [name], MD  Patient Care Team: [name], MD as PCP - General (Internal Medicine) [name], MD as Consulting Physician (Cardiology)  Extended Emergency Contact Information Primary Emergency Contact: Osborne Casco Address: [address]          [place], [place] [address] [place] Phone: [phone] Mobile Phone: [phone] Relation: Niece  Code Status:  Full Code Goals of care: Advanced Directive information Advanced Directives 04/22/2020  Does Patient Have a Medical Advance Directive? Yes  Type of Paramedic of [place];Living will  Does patient want to make changes to medical advance directive? No - Patient declined  Copy of [place] in Chart? Yes - validated most recent copy scanned in chart (See row information)  Would patient like information on creating a medical advance directive? -  Pre-existing out of facility DNR order (yellow form or pink MOST form) -     Chief Complaint  Patient presents with  . Acute Visit    UTI    HPI:  Pt is a 87 y.o. female seen today for an acute visit for UTI'  Patient has a history of hypertension, hyperlipidemia, dementia, B12 deficiency  Over the weekend c/o Weakness and increased Frequency of Urine with nausea Had one fall No Abdominal Pain no fever Urine culture positive for EColi more then 100,000 Col White Count Elevated at 11.2 with Hgb of 10.3. Was 12 before   Past Medical History:  Diagnosis Date  . Arthritis    "right thumb" (01/29/2015)  . Chronic shoulder pain    "between my shoulders" (01/29/2015)  . Dizziness   . Esophageal stricture 2009  . Gallstones   . GERD (gastroesophageal reflux disease) 2004  . Hemorrhoids 2004  . Hiatal hernia 2004  . History of blood transfusion 1947   "appendix  ruptured"  . HOH (hard of hearing)   . Hypertension   . Rhinitis, allergic   . Syncopal episodes    Past Surgical History:  Procedure Laterality Date  . APPENDECTOMY  1947   "ruptured"  . CARDIAC CATHETERIZATION  10/23/1999; 09/2014   EF 60% No significant CAD; patent coronary arteries.  . CARPAL TUNNEL RELEASE Right 1990's  . CHOLECYSTECTOMY N/A 01/29/2015   Procedure: LAPAROSCOPIC CHOLECYSTECTOMY;  Surgeon: Rolm Bookbinder, MD;  Location: [facility];  Service: General;  Laterality: N/A;  . DILATION AND CURETTAGE OF UTERUS  "couple"  . ESOPHAGOGASTRODUODENOSCOPY (EGD) WITH ESOPHAGEAL DILATION  2-3 times  . HEMORROIDECTOMY    . KNEE ARTHROSCOPY Bilateral    right x 2  . LAPAROSCOPIC CHOLECYSTECTOMY  01/29/2015  . LEFT HEART CATHETERIZATION WITH CORONARY ANGIOGRAM N/A 09/16/2014   Procedure: LEFT HEART CATHETERIZATION WITH CORONARY ANGIOGRAM;  Surgeon: [name], MD;  [facility] C[facility] LAB;  Service: Cardiovascular;  Laterality: N/A;  . NM MYOVIEW LTD  06/01/2012   EF 71%  . VAGINAL HYSTERECTOMY  1970's    Allergies  Allergen Reactions  . Contrast Media [Iodinated Diagnostic Agents] Itching and Rash    Pt covered in rash, red.  . Codeine Nausea And Vomiting    in large doses: can tolerate Tussionex    Allergies as of 07/07/2020      Reactions   Contrast Media [iodinated Diagnostic Agents] Itching, Rash   Pt covered in rash, red.   Codeine Nausea And Vomiting  in large doses: can tolerate Tussionex      Medication List       Accurate as of July 07, 2020  1:38 PM. If you have any questions, ask your nurse or doctor.        STOP taking these medications   acetaminophen 325 MG tablet Commonly known as: TYLENOL Stopped by: [name], MD   hydrochlorothiazide 25 MG tablet Commonly known as: HYDRODIURIL Stopped by: [name], MD     TAKE these medications   amLODipine 2.5 MG tablet Commonly known as: NORVASC Take 1 tablet (2.5 mg total) by mouth  daily.   HM Calcium-Vitamin D 600-800 MG-UNIT Tabs Generic drug: Calcium Carb-Cholecalciferol Take 1 tablet by mouth 2 (two) times daily.   potassium chloride SA 20 MEQ tablet Commonly known as: KLOR-CON Take 20 mEq by mouth daily.   rosuvastatin 20 MG tablet Commonly known as: CRESTOR Take 1 tablet (20 mg total) by mouth daily.       Review of Systems  Constitutional: Positive for activity change and appetite change.  HENT: Negative.   Respiratory: Negative.   Cardiovascular: Positive for leg swelling.  Gastrointestinal: Positive for nausea.  Genitourinary: Positive for frequency.  Musculoskeletal: Negative.   Neurological: Positive for weakness.  Psychiatric/Behavioral: Positive for confusion.    Immunization History  Administered Date(s) Administered  . Influenza Split 03/16/2011, 03/01/2012  . Influenza Whole 03/28/2007, 04/01/2009, 02/18/2010  . Influenza, High Dose Seasonal PF 03/14/2013  . Influenza,inj,Quad PF,6+ Mos 03/20/2014, 03/11/2015  . Influenza-Unspecified 08/06/2018, 03/20/2020  . Moderna Sars-Covid-2 Vaccination 06/11/2019, 07/09/2019  . Pneumococcal Conjugate-13 02/25/2015  . Pneumococcal Polysaccharide-23 03/28/2007  . Td 12/20/2005   Pertinent  Health Maintenance Due  Topic Date Due  . MAMMOGRAM  09/15/2013  . INFLUENZA VACCINE  Completed  . DEXA SCAN  Completed  . PNA vac Low Risk Adult  Completed   Fall Risk  04/27/2019 04/21/2018 02/15/2017 12/28/2016 08/25/2015  Falls in the past year? 0 0 No No No  Comment Emmi Telephone Survey: data to providers prior to load [employer] Telephone Survey: data to providers prior to load - Emmi Telephone Survey: data to providers prior to load -   Functional Status Survey:    Vitals:   07/07/20 1328  BP: 130/70  Pulse: 80  Resp: 18  Temp: 98 F (36.7 C)  SpO2: 93%  Weight: 206 lb 6.4 oz (93.6 kg)  Height: 5' (1.524 m)   Body mass index is 40.31 kg/m. Physical Exam Vitals reviewed.  Constitutional:       Appearance: Normal appearance. She is obese.  HENT:     Head: Normocephalic.     Nose: Nose normal.     Mouth/Throat:     Mouth: Mucous membranes are moist.     Pharynx: Oropharynx is clear.  Eyes:     Pupils: Pupils are equal, round, and reactive to light.  Cardiovascular:     Rate and Rhythm: Normal rate and regular rhythm.     Pulses: Normal pulses.  Pulmonary:     Effort: Pulmonary effort is normal.     Breath sounds: Normal breath sounds.  Abdominal:     General: Abdomen is flat. Bowel sounds are normal.     Palpations: Abdomen is soft.     Tenderness: There is no abdominal tenderness.  Musculoskeletal:     Cervical back: Neck supple.     Comments: Mild swelling bilateral  Skin:    General: Skin is warm.  Neurological:  General: No focal deficit present.     Mental Status: She is alert.  Psychiatric:        Mood and Affect: Mood normal.        Thought Content: Thought content normal.     Labs reviewed: Recent Labs    04/03/20 1211 05/09/20 0000 07/05/20 0000  NA 139 140 137  K 3.4* 4.0 4.4  CL 98 103 104  CO2 27 24* 28*  GLUCOSE 108*  --   --   BUN 22 21 18   CREATININE 1.16* 1.2* 1.0  CALCIUM 9.9 9.0 8.8   Recent Labs    04/03/20 1211  AST 17  ALT 7  BILITOT 0.9  PROT 7.0   Recent Labs    04/03/20 1211 07/05/20 0000  WBC 10.9* 11.2  NEUTROABS 6,507 7,134.00  HGB 12.4 10.3*  HCT 39.3 32*  MCV 85.6  --   PLT 350 264   Lab Results  Component Value Date   TSH 2.55 04/03/2020   Lab Results  Component Value Date   HGBA1C 5.8 07/11/2017   Lab Results  Component Value Date   CHOL 147 04/03/2020   HDL 51 04/03/2020   LDLCALC 73 04/03/2020   LDLDIRECT 161.2 07/18/2013   TRIG 143 04/03/2020   CHOLHDL 2.9 04/03/2020     Significant Diagnostic Results in last 30 days:  No results found.  Assessment/Plan Acute cystitis without hematuria Urine Positive for E Coli Keflex 250 mg for 7 days  Bilateral leg edema Better Only Ted  hose now Use Lasix PRN Hypertension Stable on Norvasc Taken off HCTZ Dyslipidemia On Crestor Last LDL 73 Dementia without behavioral disturbance, unspecified dementia type ([place]) Doing well in [place] Anemia New ? Etiology Repeat CBC in 2 weeks   Family/ staff Communication:   Labs/tests ordered:

## 2020-07-07 NOTE — Progress Notes (Signed)
A user error has taken place.

## 2020-07-11 ENCOUNTER — Non-Acute Institutional Stay: Payer: Medicare Other | Admitting: Nurse Practitioner

## 2020-07-11 ENCOUNTER — Encounter: Payer: Self-pay | Admitting: Nurse Practitioner

## 2020-07-11 DIAGNOSIS — D649 Anemia, unspecified: Secondary | ICD-10-CM

## 2020-07-11 DIAGNOSIS — D519 Vitamin B12 deficiency anemia, unspecified: Secondary | ICD-10-CM | POA: Insufficient documentation

## 2020-07-11 DIAGNOSIS — F0390 Unspecified dementia without behavioral disturbance: Secondary | ICD-10-CM | POA: Insufficient documentation

## 2020-07-11 DIAGNOSIS — D510 Vitamin B12 deficiency anemia due to intrinsic factor deficiency: Secondary | ICD-10-CM

## 2020-07-11 DIAGNOSIS — E785 Hyperlipidemia, unspecified: Secondary | ICD-10-CM

## 2020-07-11 DIAGNOSIS — R609 Edema, unspecified: Secondary | ICD-10-CM

## 2020-07-11 DIAGNOSIS — I10 Essential (primary) hypertension: Secondary | ICD-10-CM | POA: Diagnosis not present

## 2020-07-11 NOTE — Progress Notes (Addendum)
Location:   [facility] Room Number: [place] of Service:  ALF [phone]) Provider: Marda Stalker, Lennie Odor NP  [name], MD  Patient Care Team: [name], MD as PCP - General (Internal Medicine) [name], MD as Consulting Physician (Cardiology)  Extended Emergency Contact Information Primary Emergency Contact: Osborne Casco Address: [address]          [place],  [address] [place] Phone: [phone] Mobile Phone: [phone] Relation: Niece  Code Status:  Full Code Goals of care: Advanced Directive information Advanced Directives 04/22/2020  Does Patient Have a Medical Advance Directive? Yes  Type of Paramedic of [place];Living will  Does patient want to make changes to medical advance directive? No - Patient declined  Copy of [place] in Chart? Yes - validated most recent copy scanned in chart (See row information)  Would patient like information on creating a medical advance directive? -  Pre-existing out of facility DNR order (yellow form or pink MOST form) -     Chief Complaint  Patient presents with  . Medical Management of Chronic Issues  . Health Maintenance    Covid booster, TDAP, mammogram     HPI:  Pt is a 87 y.o. female seen today for medical management of chronic diseases.    UTI, improved symptoms, E-Coli, currently has been treated with 7 day course of Keflex since 07/07/20  Edema BLE, chronic, uses prn Furosemide, Bun/creat 18/1.0 07/05/20  HTN, takes Amlodipine, Bun/creat 18/1.0 07/05/20  Hyperlipidemia, LDL 73 04/03/20, takes Rosuvastatin  Dementia, functioning well in [place] [facility]  Anemia, Hgb 10.3 07/05/20, 12.4 04/03/20,  pending repeat CBC  Past Medical History:  Diagnosis Date  . Arthritis    "right thumb" (01/29/2015)  . Chronic shoulder pain    "between my shoulders" (01/29/2015)  . Dizziness   . Esophageal stricture 2009  . Gallstones    . GERD (gastroesophageal reflux disease) 2004  . Hemorrhoids 2004  . Hiatal hernia 2004  . History of blood transfusion 1947   "appendix ruptured"  . HOH (hard of hearing)   . Hypertension   . Rhinitis, allergic   . Syncopal episodes    Past Surgical History:  Procedure Laterality Date  . APPENDECTOMY  1947   "ruptured"  . CARDIAC CATHETERIZATION  10/23/1999; 09/2014   EF 60% No significant CAD; patent coronary arteries.  . CARPAL TUNNEL RELEASE Right 1990's  . CHOLECYSTECTOMY N/A 01/29/2015   Procedure: LAPAROSCOPIC CHOLECYSTECTOMY;  Surgeon: Rolm Bookbinder, MD;  Location: [facility];  Service: General;  Laterality: N/A;  . DILATION AND CURETTAGE OF UTERUS  "couple"  . ESOPHAGOGASTRODUODENOSCOPY (EGD) WITH ESOPHAGEAL DILATION  2-3 times  . HEMORROIDECTOMY    . KNEE ARTHROSCOPY Bilateral    right x 2  . LAPAROSCOPIC CHOLECYSTECTOMY  01/29/2015  . LEFT HEART CATHETERIZATION WITH CORONARY ANGIOGRAM N/A 09/16/2014   Procedure: LEFT HEART CATHETERIZATION WITH CORONARY ANGIOGRAM;  Surgeon: [name], MD;  Location: [facility] CATH LAB;  Service: Cardiovascular;  Laterality: N/A;  . NM MYOVIEW LTD  06/01/2012   EF 71%  . VAGINAL HYSTERECTOMY  1970's    Allergies  Allergen Reactions  . Contrast Media [Iodinated Diagnostic Agents] Itching and Rash    Pt covered in rash, red.  . Codeine Nausea And Vomiting    in large doses: can tolerate Tussionex    Allergies as of 07/11/2020      Reactions   Contrast Media [iodinated  Diagnostic Agents] Itching, Rash   Pt covered in rash, red.   Codeine Nausea And Vomiting   in large doses: can tolerate Tussionex      Medication List       Accurate as of July 11, 2020 11:59 PM. If you have any questions, ask your nurse or doctor.        STOP taking these medications   potassium chloride SA 20 MEQ tablet Commonly known as: KLOR-CON Stopped by: Yalexa Blust X Delmus Warwick, NP     TAKE these medications   amLODipine 2.5 MG tablet Commonly known as:  NORVASC Take 1 tablet (2.5 mg total) by mouth daily.   cephALEXin 250 MG capsule Commonly known as: KEFLEX Take 250 mg by mouth 4 (four) times daily.   HM Calcium-Vitamin D 600-800 MG-UNIT Tabs Generic drug: Calcium Carb-Cholecalciferol Take 1 tablet by mouth 2 (two) times daily.   rosuvastatin 20 MG tablet Commonly known as: CRESTOR Take 1 tablet (20 mg total) by mouth daily.   saccharomyces boulardii 250 MG capsule Commonly known as: FLORASTOR Take 250 mg by mouth 2 (two) times daily.       Review of Systems  Constitutional: Negative for activity change, appetite change and fever.  HENT: Positive for hearing loss. Negative for congestion, trouble swallowing and voice change.   Respiratory: Negative for cough, shortness of breath and wheezing.   Cardiovascular: Positive for leg swelling. Negative for chest pain and palpitations.  Gastrointestinal: Negative for abdominal pain, constipation, nausea and vomiting.  Genitourinary: Positive for frequency. Negative for dysuria and urgency.  Musculoskeletal: Positive for gait problem.  Skin: Negative for color change.  Neurological: Negative for speech difficulty, weakness and headaches.       Memory lapses.   Psychiatric/Behavioral: Positive for confusion. Negative for behavioral problems and sleep disturbance. The patient is not nervous/anxious.     Immunization History  Administered Date(s) Administered  . Influenza Split 03/16/2011, 03/01/2012  . Influenza Whole 03/28/2007, 04/01/2009, 02/18/2010  . Influenza, High Dose Seasonal PF 03/14/2013  . Influenza,inj,Quad PF,6+ Mos 03/20/2014, 03/11/2015  . Influenza-Unspecified 08/06/2018, 03/20/2020  . Moderna Sars-Covid-2 Vaccination 06/11/2019, 07/09/2019  . Pneumococcal Conjugate-13 02/25/2015  . Pneumococcal Polysaccharide-23 03/28/2007  . Td 12/20/2005   Pertinent  Health Maintenance Due  Topic Date Due  . MAMMOGRAM  09/15/2013  . INFLUENZA VACCINE  Completed  . DEXA  SCAN  Completed  . PNA vac Low Risk Adult  Completed   Fall Risk  04/27/2019 04/21/2018 02/15/2017 12/28/2016 08/25/2015  Falls in the past year? 0 0 No No No  Comment Emmi Telephone Survey: data to providers prior to load [employer] Telephone Survey: data to providers prior to load - Emmi Telephone Survey: data to providers prior to load -   Functional Status Survey:    Vitals:   07/11/20 0942  BP: 132/70  Pulse: 80  Resp: 16  Temp: 97.7 F (36.5 C)  SpO2: 96%  Weight: 206 lb 6.4 oz (93.6 kg)  Height: 5' (1.524 m)   Body mass index is 40.31 kg/m. Physical Exam Constitutional:      Appearance: Normal appearance.  HENT:     Head: Normocephalic and atraumatic.     Nose: Nose normal.     Mouth/Throat:     Mouth: Mucous membranes are moist.  Eyes:     Extraocular Movements: Extraocular movements intact.     Conjunctiva/sclera: Conjunctivae normal.     Pupils: Pupils are equal, round, and reactive to light.  Cardiovascular:  Rate and Rhythm: Normal rate and regular rhythm.     Heart sounds: No murmur heard.   Pulmonary:     Effort: Pulmonary effort is normal.     Breath sounds: No wheezing or rales.  Abdominal:     General: Bowel sounds are normal.     Palpations: Abdomen is soft.     Tenderness: There is no abdominal tenderness.  Musculoskeletal:     Cervical back: Normal range of motion and neck supple.     Right lower leg: Edema present.     Left lower leg: Edema present.     Comments: 1+ edema BLE  Skin:    General: Skin is warm and dry.  Neurological:     General: No focal deficit present.     Mental Status: She is alert. Mental status is at baseline.     Gait: Gait abnormal.     Comments: Oriented to person, her room on unit.   Psychiatric:        Mood and Affect: Mood normal.        Behavior: Behavior normal.     Labs reviewed: Recent Labs    04/03/20 1211 05/09/20 0000 07/05/20 0000  NA 139 140 137  K 3.4* 4.0 4.4  CL 98 103 104  CO2 27 24* 28*   GLUCOSE 108*  --   --   BUN 22 21 18   CREATININE 1.16* 1.2* 1.0  CALCIUM 9.9 9.0 8.8   Recent Labs    04/03/20 1211  AST 17  ALT 7  BILITOT 0.9  PROT 7.0   Recent Labs    04/03/20 1211 07/05/20 0000  WBC 10.9* 11.2  NEUTROABS 6,507 7,134.00  HGB 12.4 10.3*  HCT 39.3 32*  MCV 85.6  --   PLT 350 264   Lab Results  Component Value Date   TSH 2.55 04/03/2020   Lab Results  Component Value Date   HGBA1C 5.8 07/11/2017   Lab Results  Component Value Date   CHOL 147 04/03/2020   HDL 51 04/03/2020   LDLCALC 73 04/03/2020   LDLDIRECT 161.2 07/18/2013   TRIG 143 04/03/2020   CHOLHDL 2.9 04/03/2020    Significant Diagnostic Results in last 30 days:  No results found.  Assessment/Plan Essential hypertension takes Amlodipine, Bun/creat 18/1.0 07/05/20   Dyslipidemia LDL 73 04/03/20, takes Rosuvastatin   Senile dementia ([place]) functioning well in [place] [facility]  Anemia Hgb 10.3 07/05/20, 12.4 04/03/20,  pending repeat CBC   DEPENDENT EDEMA chronic, uses prn Furosemide, Bun/creat 18/1.0 07/05/20   Vitamin B12 deficiency anemia 04/03/20 Vit B12 228 07/23/20 wbc 6.9, Hgb 10.4, plt 304, will adding Vit B12 1047mcg qd, CBC/diff one month.       Family/ staff Communication: plan of care reviewed with the patient and charge nurse.   Labs/tests ordered: CBC/diff one month.   Time spend 40 minutes.

## 2020-07-11 NOTE — Assessment & Plan Note (Signed)
chronic, uses prn Furosemide, Bun/creat 18/1.0 07/05/20

## 2020-07-11 NOTE — Assessment & Plan Note (Signed)
Hgb 10.3 07/05/20, 12.4 04/03/20,  pending repeat CBC

## 2020-07-11 NOTE — Assessment & Plan Note (Signed)
takes Amlodipine, Bun/creat 18/1.0 07/05/20

## 2020-07-11 NOTE — Assessment & Plan Note (Signed)
LDL 73 04/03/20, takes Rosuvastatin °

## 2020-07-11 NOTE — Assessment & Plan Note (Signed)
functioning well in [place] [facility]

## 2020-07-15 ENCOUNTER — Encounter: Payer: Self-pay | Admitting: Nurse Practitioner

## 2020-07-22 DIAGNOSIS — I10 Essential (primary) hypertension: Secondary | ICD-10-CM | POA: Diagnosis not present

## 2020-07-22 DIAGNOSIS — E876 Hypokalemia: Secondary | ICD-10-CM | POA: Diagnosis not present

## 2020-07-23 ENCOUNTER — Encounter: Payer: Self-pay | Admitting: Nurse Practitioner

## 2020-07-23 NOTE — Assessment & Plan Note (Signed)
04/03/20 Vit B12 228 07/23/20 wbc 6.9, Hgb 10.4, plt 304, will adding Vit B12 1073mcg qd, CBC/diff one month.

## 2020-08-06 ENCOUNTER — Encounter: Payer: Self-pay | Admitting: Nurse Practitioner

## 2020-08-06 ENCOUNTER — Non-Acute Institutional Stay: Payer: Medicare Other | Admitting: Nurse Practitioner

## 2020-08-06 DIAGNOSIS — I10 Essential (primary) hypertension: Secondary | ICD-10-CM | POA: Diagnosis not present

## 2020-08-06 DIAGNOSIS — D519 Vitamin B12 deficiency anemia, unspecified: Secondary | ICD-10-CM

## 2020-08-06 DIAGNOSIS — R413 Other amnesia: Secondary | ICD-10-CM

## 2020-08-06 DIAGNOSIS — I872 Venous insufficiency (chronic) (peripheral): Secondary | ICD-10-CM

## 2020-08-06 DIAGNOSIS — E785 Hyperlipidemia, unspecified: Secondary | ICD-10-CM | POA: Diagnosis not present

## 2020-08-06 NOTE — Assessment & Plan Note (Addendum)
LDL 73 04/03/20, takes Rosuvastatin °

## 2020-08-06 NOTE — Progress Notes (Signed)
Location:   [place] Room Number: 161 Place of Service:  ALF (13) Provider: Lennie Odor Luretta Everly NP  [name], MD  Patient Care Team: [name], MD as PCP - General (Internal Medicine) [name], MD as Consulting Physician (Cardiology)  Extended Emergency Contact Information Primary Emergency Contact: Osborne Casco Address: [address]          [place], [place] [address] [place] Phone: [phone] Mobile Phone: [phone] Relation: Niece  Code Status: DNR Goals of care: Advanced Directive information Advanced Directives 04/22/2020  Does Patient Have a Medical Advance Directive? Yes  Type of Paramedic of [place];Living will  Does patient want to make changes to medical advance directive? No - Patient declined  Copy of [place] in Chart? Yes - validated most recent copy scanned in chart (See row information)  Would patient like information on creating a medical advance directive? -  Pre-existing out of facility DNR order (yellow form or pink MOST form) -     Chief Complaint  Patient presents with  . Acute Visit    Swelling legs, painful to touch    HPI:  Pt is a 87 y.o. female seen today for an acute visit for swelling in legs, painful to touch, mild erythema    Edema BLE, chronic, uses  Furosemide, Bun/creat 18/1.0 07/05/20. DOE, dry cough, not new,  EF 50-55% 06/01/2017.             HTN, takes Amlodipine, Bun/creat 18/1.0 07/05/20             Hyperlipidemia, LDL 73 04/03/20, takes Rosuvastatin             Dementia, functioning well in [facility], 06/26/20 MMSE 23/30, failed clock drawing.              Anemia, Hgb 10.3 07/05/20, 10.4 07/23/20, Vit B12 228 04/03/20, added Vit B 216/22, pending CBC 4 weeks.   Past Medical History:  Diagnosis Date  . Arthritis    "right thumb" (01/29/2015)  . Chronic shoulder pain    "between my shoulders" (01/29/2015)  . Dizziness   . Esophageal  stricture 2009  . Gallstones   . GERD (gastroesophageal reflux disease) 2004  . Hemorrhoids 2004  . Hiatal hernia 2004  . History of blood transfusion 1947   "appendix ruptured"  . HOH (hard of hearing)   . Hypertension   . Rhinitis, allergic   . Syncopal episodes    Past Surgical History:  Procedure Laterality Date  . APPENDECTOMY  1947   "ruptured"  . CARDIAC CATHETERIZATION  10/23/1999; 09/2014   EF 60% No significant CAD; patent coronary arteries.  . CARPAL TUNNEL RELEASE Right 1990's  . CHOLECYSTECTOMY N/A 01/29/2015   Procedure: LAPAROSCOPIC CHOLECYSTECTOMY;  Surgeon: Rolm Bookbinder, MD;  Location: [facility];  Service: General;  Laterality: N/A;  . DILATION AND CURETTAGE OF UTERUS  "couple"  . ESOPHAGOGASTRODUODENOSCOPY (EGD) WITH ESOPHAGEAL DILATION  2-3 times  . HEMORROIDECTOMY    . KNEE ARTHROSCOPY Bilateral    right x 2  . LAPAROSCOPIC CHOLECYSTECTOMY  01/29/2015  . LEFT HEART CATHETERIZATION WITH CORONARY ANGIOGRAM N/A 09/16/2014   Procedure: LEFT HEART CATHETERIZATION WITH CORONARY ANGIOGRAM;  Surgeon: [name], MD;  Location: [facility] CATH LAB;  Service: Cardiovascular;  Laterality: N/A;  . NM MYOVIEW LTD  06/01/2012   EF 71%  . VAGINAL HYSTERECTOMY  1970's    Allergies  Allergen Reactions  .  Contrast Media [Iodinated Diagnostic Agents] Itching and Rash    Pt covered in rash, red.  . Codeine Nausea And Vomiting    in large doses: can tolerate Tussionex    Allergies as of 08/06/2020      Reactions   Contrast Media [iodinated Diagnostic Agents] Itching, Rash   Pt covered in rash, red.   Codeine Nausea And Vomiting   in large doses: can tolerate Tussionex      Medication List       Accurate as of August 06, 2020  3:31 PM. If you have any questions, ask your nurse or doctor.        amLODipine 2.5 MG tablet Commonly known as: NORVASC Take 1 tablet (2.5 mg total) by mouth daily.   HM Calcium-Vitamin D 600-800 MG-UNIT Tabs Generic drug: Calcium  Carb-Cholecalciferol Take 1 tablet by mouth 2 (two) times daily.   rosuvastatin 20 MG tablet Commonly known as: CRESTOR Take 1 tablet (20 mg total) by mouth daily.       Review of Systems  Constitutional: Negative for appetite change, fatigue and fever.  HENT: Positive for hearing loss. Negative for congestion, trouble swallowing and voice change.   Respiratory: Negative for cough, shortness of breath and wheezing.   Cardiovascular: Positive for leg swelling. Negative for chest pain and palpitations.  Gastrointestinal: Negative for abdominal pain and constipation.  Genitourinary: Positive for frequency. Negative for dysuria and urgency.  Musculoskeletal: Positive for gait problem.  Skin: Negative for color change.  Neurological: Negative for speech difficulty, weakness and headaches.       Memory lapses.   Psychiatric/Behavioral: Positive for confusion. Negative for behavioral problems and sleep disturbance. The patient is not nervous/anxious.     Immunization History  Administered Date(s) Administered  . Influenza Split 03/16/2011, 03/01/2012  . Influenza Whole 03/28/2007, 04/01/2009, 02/18/2010  . Influenza, High Dose Seasonal PF 03/14/2013  . Influenza,inj,Quad PF,6+ Mos 03/20/2014, 03/11/2015  . Influenza-Unspecified 08/06/2018, 03/20/2020  . Moderna Sars-Covid-2 Vaccination 06/11/2019, 07/09/2019  . Pneumococcal Conjugate-13 02/25/2015  . Pneumococcal Polysaccharide-23 03/28/2007  . Td 12/20/2005   Pertinent  Health Maintenance Due  Topic Date Due  . MAMMOGRAM  09/15/2013  . INFLUENZA VACCINE  Completed  . DEXA SCAN  Completed  . PNA vac Low Risk Adult  Completed   Fall Risk  04/27/2019 04/21/2018 02/15/2017 12/28/2016 08/25/2015  Falls in the past year? 0 0 No No No  Comment Emmi Telephone Survey: data to providers prior to load [employer] Telephone Survey: data to providers prior to load - Emmi Telephone Survey: data to providers prior to load -   Functional Status  Survey:    Vitals:   08/06/20 0955  BP: 140/74  Pulse: 94  Resp: 20  Temp: 98.4 F (36.9 C)  SpO2: 95%   There is no height or weight on file to calculate BMI. Physical Exam Constitutional:      Appearance: Normal appearance.  HENT:     Head: Normocephalic and atraumatic.     Nose: Nose normal.     Mouth/Throat:     Mouth: Mucous membranes are moist.  Eyes:     Extraocular Movements: Extraocular movements intact.     Conjunctiva/sclera: Conjunctivae normal.     Pupils: Pupils are equal, round, and reactive to light.  Cardiovascular:     Rate and Rhythm: Normal rate and regular rhythm.     Heart sounds: No murmur heard.   Pulmonary:     Effort: Pulmonary effort is normal.  Breath sounds: No rales.  Abdominal:     General: Bowel sounds are normal.     Palpations: Abdomen is soft.     Tenderness: There is no abdominal tenderness.  Musculoskeletal:     Cervical back: Normal range of motion and neck supple.     Right lower leg: Edema present.     Left lower leg: Edema present.     Comments: 2+ edema BLE, sensitive/painful to touch, mild erythema and warmth, no cordlike findings or pain in calves.   Skin:    General: Skin is warm and dry.  Neurological:     General: No focal deficit present.     Mental Status: She is alert. Mental status is at baseline.     Gait: Gait abnormal.     Comments: Oriented to person, her room on unit.   Psychiatric:        Mood and Affect: Mood normal.        Behavior: Behavior normal.     Labs reviewed: Recent Labs    04/03/20 1211 05/09/20 0000 07/05/20 0000  NA 139 140 137  K 3.4* 4.0 4.4  CL 98 103 104  CO2 27 24* 28*  GLUCOSE 108*  --   --   BUN 22 21 18   CREATININE 1.16* 1.2* 1.0  CALCIUM 9.9 9.0 8.8   Recent Labs    04/03/20 1211  AST 17  ALT 7  BILITOT 0.9  PROT 7.0   Recent Labs    04/03/20 1211 07/05/20 0000  WBC 10.9* 11.2  NEUTROABS 6,507 7,134.00  HGB 12.4 10.3*  HCT 39.3 32*  MCV 85.6  --    PLT 350 264   Lab Results  Component Value Date   TSH 2.55 04/03/2020   Lab Results  Component Value Date   HGBA1C 5.8 07/11/2017   Lab Results  Component Value Date   CHOL 147 04/03/2020   HDL 51 04/03/2020   LDLCALC 73 04/03/2020   LDLDIRECT 161.2 07/18/2013   TRIG 143 04/03/2020   CHOLHDL 2.9 04/03/2020    Significant Diagnostic Results in last 30 days:  No results found.  Assessment/Plan: Edema of both lower extremities due to peripheral venous insufficiency Edema BLE, chronic, uses  Furosemide 3x/week after 20mg  qd x 1wk started 06/24/20, Bun/creat 18/1.0 07/05/20. Will increase Furosemide to 20mg  qd, BMP one week DOE, dry cough, not new,  EF 50-55% 06/01/2017. May consider BNP, repeat echocardiogram if DOE worsens.   Essential hypertension  takes Amlodipine, Bun/creat 18/1.0 07/05/20  Dyslipidemia LDL 73 04/03/20, takes Rosuvastatin   Memory loss functioning well in [facility], 06/26/20 MMSE 23/30, failed clock drawing.    Vitamin B12 deficiency anemia Hgb 10.3 07/05/20, 10.4 07/23/20, Vit B12 228 04/03/20, added Vit B 216/22, pending CBC 4 weeks. May do anemia panel if Hgb is not better or worse.      Family/ staff Communication: plan of care reviewed with the patient and charge nurse.   Labs/tests ordered:  BMP one week  Time spend 40 minutes.

## 2020-08-06 NOTE — Assessment & Plan Note (Addendum)
functioning well in [place], 06/26/20 MMSE 23/30, failed clock drawing.

## 2020-08-06 NOTE — Assessment & Plan Note (Addendum)
Hgb 10.3 07/05/20, 10.4 07/23/20, Vit B12 228 04/03/20, added Vit B 216/22, pending CBC 4 weeks. May do anemia panel if Hgb is not better or worse.

## 2020-08-06 NOTE — Assessment & Plan Note (Signed)
takes Amlodipine, Bun/creat 18/1.0 07/05/20

## 2020-08-06 NOTE — Assessment & Plan Note (Addendum)
Edema BLE, chronic, uses  Furosemide 3x/week after 20mg  qd x 1wk started 06/24/20, Bun/creat 18/1.0 07/05/20. Will increase Furosemide to 20mg  qd, BMP one week DOE, dry cough, not new,  EF 50-55% 06/01/2017. May consider BNP, repeat echocardiogram if DOE worsens.

## 2020-08-12 DIAGNOSIS — R609 Edema, unspecified: Secondary | ICD-10-CM | POA: Diagnosis not present

## 2020-08-19 DIAGNOSIS — D519 Vitamin B12 deficiency anemia, unspecified: Secondary | ICD-10-CM | POA: Diagnosis not present

## 2020-08-19 LAB — CBC AND DIFFERENTIAL
HCT: 32 — AB (ref 36–46)
Hemoglobin: 9.9 — AB (ref 12.0–16.0)
Neutrophils Absolute: 4395
Platelets: 17 — AB (ref 150–399)
WBC: 8.6

## 2020-08-19 LAB — CBC: RBC: 3.79 — AB (ref 3.87–5.11)

## 2020-08-26 DIAGNOSIS — D649 Anemia, unspecified: Secondary | ICD-10-CM | POA: Diagnosis not present

## 2020-08-27 ENCOUNTER — Encounter: Payer: Self-pay | Admitting: Nurse Practitioner

## 2020-08-27 LAB — CBC AND DIFFERENTIAL
HCT: 31 — AB (ref 36–46)
Hemoglobin: 9.5 — AB (ref 12.0–16.0)
Platelets: 244 (ref 150–399)
WBC: 6.4

## 2020-08-27 LAB — CBC: RBC: 3.72 — AB (ref 3.87–5.11)

## 2020-08-27 LAB — IRON,TIBC AND FERRITIN PANEL
%SAT: 6
Ferritin: 6
Iron: 23
TIBC: 368

## 2020-09-25 DIAGNOSIS — D649 Anemia, unspecified: Secondary | ICD-10-CM | POA: Diagnosis not present

## 2020-09-25 DIAGNOSIS — I10 Essential (primary) hypertension: Secondary | ICD-10-CM | POA: Diagnosis not present

## 2020-09-25 LAB — CBC AND DIFFERENTIAL
HCT: 37 (ref 36–46)
Hemoglobin: 11.3 — AB (ref 12.0–16.0)
Platelets: 275 (ref 150–399)
WBC: 7.3

## 2020-09-25 LAB — CBC: RBC: 4.33 (ref 3.87–5.11)

## 2020-11-04 ENCOUNTER — Non-Acute Institutional Stay: Payer: Medicare Other | Admitting: Nurse Practitioner

## 2020-11-04 ENCOUNTER — Encounter: Payer: Self-pay | Admitting: Nurse Practitioner

## 2020-11-04 DIAGNOSIS — I10 Essential (primary) hypertension: Secondary | ICD-10-CM

## 2020-11-04 DIAGNOSIS — Z23 Encounter for immunization: Secondary | ICD-10-CM | POA: Diagnosis not present

## 2020-11-04 DIAGNOSIS — F0390 Unspecified dementia without behavioral disturbance: Secondary | ICD-10-CM

## 2020-11-04 DIAGNOSIS — D649 Anemia, unspecified: Secondary | ICD-10-CM | POA: Diagnosis not present

## 2020-11-04 DIAGNOSIS — K219 Gastro-esophageal reflux disease without esophagitis: Secondary | ICD-10-CM

## 2020-11-04 DIAGNOSIS — I872 Venous insufficiency (chronic) (peripheral): Secondary | ICD-10-CM

## 2020-11-04 DIAGNOSIS — E785 Hyperlipidemia, unspecified: Secondary | ICD-10-CM | POA: Diagnosis not present

## 2020-11-04 NOTE — Progress Notes (Signed)
Location:   [facility] Room Number: [place] of Service:  ALF [id number]) Provider: Marlana Latus NP  [name], MD  Patient Care Team: [name], MD as PCP - General (Internal Medicine) [name], MD as Consulting Physician (Cardiology)  Extended Emergency Contact Information Primary Emergency Contact: Osborne Casco Address: [address]          [place], [place] [address] [place] Phone: [phone] Mobile Phone: [phone] Relation: Niece  Code Status: DNR Goals of care: Advanced Directive information Advanced Directives 04/22/2020  Does Patient Have a Medical Advance Directive? Yes  Type of Paramedic of [place];Living will  Does patient want to make changes to medical advance directive? No - Patient declined  Copy of [place] in Chart? Yes - validated most recent copy scanned in chart (See row information)  Would patient like information on creating a medical advance directive? -  Pre-existing out of facility DNR order (yellow form or pink MOST form) -     Chief Complaint  Patient presents with  . Acute Visit    Heart burns    HPI:  Pt is a 87 y.o. female seen today for an acute visit for indigestion, burping, frequent use of Mylanta, denied nausea, vomiting, constipation, or abd pain.   Edema BLE, chronic, uses  Furosemide, Bun/creat 18/1.0 07/05/20. DOE, dry cough, not new,  EF 50-55% 06/01/2017. HTN, takes Amlodipine, Bun/creat 18/1.0 07/05/20 Hyperlipidemia, LDL 73 04/03/20, takes Rosuvastatin Dementia, functioning well in [facility], 06/26/20 MMSE 23/30, failed clock drawing.  Anemia, Vit B12 228 04/03/20, added Vit B 216/22, Hgb 11.3 09/25/20   Past Medical History:  Diagnosis Date  . Arthritis    "right thumb" (01/29/2015)  . Chronic shoulder pain    "between my shoulders" (01/29/2015)  . Dizziness   .  Esophageal stricture 2009  . Gallstones   . GERD (gastroesophageal reflux disease) 2004  . Hemorrhoids 2004  . Hiatal hernia 2004  . History of blood transfusion 1947   "appendix ruptured"  . HOH (hard of hearing)   . Hypertension   . Rhinitis, allergic   . Syncopal episodes    Past Surgical History:  Procedure Laterality Date  . APPENDECTOMY  1947   "ruptured"  . CARDIAC CATHETERIZATION  10/23/1999; 09/2014   EF 60% No significant CAD; patent coronary arteries.  . CARPAL TUNNEL RELEASE Right 1990's  . CHOLECYSTECTOMY N/A 01/29/2015   Procedure: LAPAROSCOPIC CHOLECYSTECTOMY;  Surgeon: Rolm Bookbinder, MD;  Location: [facility];  Service: General;  Laterality: N/A;  . DILATION AND CURETTAGE OF UTERUS  "couple"  . ESOPHAGOGASTRODUODENOSCOPY (EGD) WITH ESOPHAGEAL DILATION  2-3 times  . HEMORROIDECTOMY    . KNEE ARTHROSCOPY Bilateral    right x 2  . LAPAROSCOPIC CHOLECYSTECTOMY  01/29/2015  . LEFT HEART CATHETERIZATION WITH CORONARY ANGIOGRAM N/A 09/16/2014   Procedure: LEFT HEART CATHETERIZATION WITH CORONARY ANGIOGRAM;  Surgeon: [name], MD;  Location: [facility] CATH LAB;  Service: Cardiovascular;  Laterality: N/A;  . NM MYOVIEW LTD  06/01/2012   EF 71%  . VAGINAL HYSTERECTOMY  1970's    Allergies  Allergen Reactions  . Contrast Media [Iodinated Diagnostic Agents] Itching and Rash    Pt covered in rash, red.  . Codeine Nausea And Vomiting    in large doses: can tolerate Tussionex    Allergies as of 11/04/2020      Reactions   Contrast Media [iodinated Diagnostic Agents] Itching,  Rash   Pt covered in rash, red.   Codeine Nausea And Vomiting   in large doses: can tolerate Tussionex      Medication List       Accurate as of Nov 04, 2020 11:59 PM. If you have any questions, ask your nurse or doctor.        alum & mag hydroxide-simeth 200-200-20 MG/5ML suspension Commonly known as: MAALOX/MYLANTA Take 30 mLs by mouth every 4 (four) hours as needed for indigestion or  heartburn.   amLODipine 2.5 MG tablet Commonly known as: NORVASC Take 1 tablet (2.5 mg total) by mouth daily.   ferrous sulfate 325 (65 FE) MG tablet Take 325 mg by mouth. Monday, Wednesday and Fridays   furosemide 20 MG tablet Commonly known as: LASIX Take 20 mg by mouth daily.   HM Calcium-Vitamin D 600-800 MG-UNIT Tabs Generic drug: Calcium Carb-Cholecalciferol Take 1 tablet by mouth 2 (two) times daily.   rosuvastatin 20 MG tablet Commonly known as: CRESTOR Take 1 tablet (20 mg total) by mouth daily.   vitamin B-12 1000 MCG tablet Commonly known as: CYANOCOBALAMIN Take 1,000 mcg by mouth daily.       Review of Systems  Constitutional: Negative for appetite change, fatigue and fever.  HENT: Positive for hearing loss. Negative for congestion, trouble swallowing and voice change.   Respiratory: Negative for cough, shortness of breath and wheezing.   Cardiovascular: Positive for leg swelling. Negative for chest pain and palpitations.  Gastrointestinal: Negative for abdominal pain, constipation, nausea and vomiting.       Indigestion, heartburns, burping.   Genitourinary: Positive for frequency. Negative for dysuria and urgency.  Musculoskeletal: Positive for gait problem.  Skin: Negative for color change.  Neurological: Negative for speech difficulty, weakness and light-headedness.       Memory lapses.   Psychiatric/Behavioral: Positive for confusion. Negative for behavioral problems and sleep disturbance. The patient is not nervous/anxious.     Immunization History  Administered Date(s) Administered  . Influenza Split 03/16/2011, 03/01/2012  . Influenza Whole 03/28/2007, 04/01/2009, 02/18/2010  . Influenza, High Dose Seasonal PF 03/14/2013  . Influenza,inj,Quad PF,6+ Mos 03/20/2014, 03/11/2015  . Influenza-Unspecified 08/06/2018, 03/20/2020  . Moderna Sars-Covid-2 Vaccination 06/11/2019, 07/09/2019  . Pneumococcal Conjugate-13 02/25/2015  . Pneumococcal  Polysaccharide-23 03/28/2007  . Td 12/20/2005   Pertinent  Health Maintenance Due  Topic Date Due  . MAMMOGRAM  09/15/2013  . INFLUENZA VACCINE  01/05/2021  . DEXA SCAN  Completed  . PNA vac Low Risk Adult  Completed   Fall Risk  04/27/2019 04/21/2018 02/15/2017 12/28/2016 08/25/2015  Falls in the past year? 0 0 No No No  Comment Emmi Telephone Survey: data to providers prior to load [employer] Telephone Survey: data to providers prior to load - Emmi Telephone Survey: data to providers prior to load -   Functional Status Survey:    Vitals:   11/04/20 1646  BP: 140/80  Pulse: 83  Resp: 18  Temp: 98.1 F (36.7 C)  SpO2: 96%  Weight: 209 lb 12.8 oz (95.2 kg)  Height: 5' (1.524 m)   Body mass index is 40.97 kg/m. Physical Exam Constitutional:      Appearance: Normal appearance.  HENT:     Head: Normocephalic and atraumatic.     Mouth/Throat:     Mouth: Mucous membranes are moist.  Eyes:     Extraocular Movements: Extraocular movements intact.     Conjunctiva/sclera: Conjunctivae normal.     Pupils: Pupils are equal, round, and reactive  to light.  Cardiovascular:     Rate and Rhythm: Normal rate and regular rhythm.     Heart sounds: No murmur heard.   Pulmonary:     Effort: Pulmonary effort is normal.     Breath sounds: No rales.  Abdominal:     General: Bowel sounds are normal.     Palpations: Abdomen is soft.     Tenderness: There is no abdominal tenderness. There is no right CVA tenderness, left CVA tenderness, guarding or rebound.  Musculoskeletal:     Cervical back: Normal range of motion and neck supple.     Right lower leg: Edema present.     Left lower leg: Edema present.     Comments: 1+ edema BLE, chronic venous insufficiency skin changes.   Skin:    General: Skin is warm and dry.  Neurological:     General: No focal deficit present.     Mental Status: She is alert. Mental status is at baseline.     Gait: Gait abnormal.     Comments: Oriented to person, her  room on unit.   Psychiatric:        Mood and Affect: Mood normal.        Behavior: Behavior normal.     Labs reviewed: Recent Labs    04/03/20 1211 05/09/20 0000 07/05/20 0000  NA 139 140 137  K 3.4* 4.0 4.4  CL 98 103 104  CO2 27 24* 28*  GLUCOSE 108*  --   --   BUN 22 21 18   CREATININE 1.16* 1.2* 1.0  CALCIUM 9.9 9.0 8.8   Recent Labs    04/03/20 1211  AST 17  ALT 7  BILITOT 0.9  PROT 7.0   Recent Labs    04/03/20 1211 04/03/20 1211 07/05/20 0000 08/19/20 0000 08/27/20 0000 09/25/20 0000  WBC 10.9*   < > 11.2 8.6 6.4 7.3  NEUTROABS 6,507  --  7,134.00 4,395.00  --   --   HGB 12.4  --  10.3* 9.9* 9.5* 11.3*  HCT 39.3  --  32* 32* 31* 37  MCV 85.6  --   --   --   --   --   PLT 350  --  264 17* 244 275   < > = values in this interval not displayed.   Lab Results  Component Value Date   TSH 2.55 04/03/2020   Lab Results  Component Value Date   HGBA1C 5.8 07/11/2017   Lab Results  Component Value Date   CHOL 147 04/03/2020   HDL 51 04/03/2020   LDLCALC 73 04/03/2020   LDLDIRECT 161.2 07/18/2013   TRIG 143 04/03/2020   CHOLHDL 2.9 04/03/2020    Significant Diagnostic Results in last 30 days:  No results found.  Assessment/Plan: GERD Frequent use of Mylanta, Hx of GERD, will try Omeprazole 20mg  qd.   Anemia Vit B12 228 04/03/20, added Vit B 216/22, Hgb 11.3 09/25/20  Senile dementia ([place])  functioning well in [facility], 06/26/20 MMSE 23/30, failed clock drawing.   Dyslipidemia LDL 73 04/03/20, takes Rosuvastatin   Essential hypertension Blood pressure is controlled, takes Amlodipine, Bun/creat 18/1.0 07/05/20   Edema of both lower extremities due to peripheral venous insufficiency  chronic, uses  Furosemide, Bun/creat 18/1.0 07/05/20. DOE, dry cough, not new,  EF 50-55% 06/01/2017.     Family/ staff Communication: plan of care reviewed with the patient and charge nurse.   Labs/tests ordered:   none  Time spend 40  minutes.

## 2020-11-04 NOTE — Assessment & Plan Note (Signed)
Frequent use of Mylanta, Hx of GERD, will try Omeprazole 20mg  qd.

## 2020-11-05 ENCOUNTER — Encounter: Payer: Self-pay | Admitting: Nurse Practitioner

## 2020-11-05 NOTE — Assessment & Plan Note (Signed)
Vit B12 228 04/03/20, added Vit B 216/22, Hgb 11.3 09/25/20

## 2020-11-05 NOTE — Assessment & Plan Note (Signed)
LDL 73 04/03/20, takes Rosuvastatin °

## 2020-11-05 NOTE — Assessment & Plan Note (Signed)
Blood pressure is controlled, takes Amlodipine, Bun/creat 18/1.0 07/05/20

## 2020-11-05 NOTE — Assessment & Plan Note (Signed)
chronic, uses  Furosemide, Bun/creat 18/1.0 07/05/20. DOE, dry cough, not new,  EF 50-55% 06/01/2017.

## 2020-11-05 NOTE — Assessment & Plan Note (Signed)
functioning well in [place], 06/26/20 MMSE 23/30, failed clock drawing.

## 2020-12-23 ENCOUNTER — Encounter: Payer: Self-pay | Admitting: Internal Medicine

## 2020-12-23 ENCOUNTER — Non-Acute Institutional Stay: Payer: Medicare Other | Admitting: Internal Medicine

## 2020-12-23 DIAGNOSIS — I10 Essential (primary) hypertension: Secondary | ICD-10-CM

## 2020-12-23 DIAGNOSIS — I872 Venous insufficiency (chronic) (peripheral): Secondary | ICD-10-CM | POA: Diagnosis not present

## 2020-12-23 DIAGNOSIS — D649 Anemia, unspecified: Secondary | ICD-10-CM | POA: Diagnosis not present

## 2020-12-23 DIAGNOSIS — R053 Chronic cough: Secondary | ICD-10-CM | POA: Diagnosis not present

## 2020-12-23 DIAGNOSIS — R4189 Other symptoms and signs involving cognitive functions and awareness: Secondary | ICD-10-CM | POA: Diagnosis not present

## 2020-12-23 DIAGNOSIS — E785 Hyperlipidemia, unspecified: Secondary | ICD-10-CM

## 2020-12-23 NOTE — Progress Notes (Signed)
Location:   [place] Room Number: [employer] of Service:  ALF [phone]) Provider:  Veleta Miners MD  [name], MD  Patient Care Team: [name], MD as PCP - General (Internal Medicine) [name], MD as Consulting Physician (Cardiology)  Extended Emergency Contact Information Primary Emergency Contact: Osborne Casco Address: [address]          [place], [place] [address] [place] Phone: [phone] Mobile Phone: [phone] Relation: Niece  Code Status:  Full Code Goals of care: Advanced Directive information Advanced Directives 12/23/2020  Does Patient Have a Medical Advance Directive? Yes  Type of Advance Directive [place]  Does patient want to make changes to medical advance directive? No - Patient declined  Copy of [place] in Chart? Yes - validated most recent copy scanned in chart (See row information)  Would patient like information on creating a medical advance directive? -  Pre-existing out of facility DNR order (yellow form or pink MOST form) -     Chief Complaint  Patient presents with   Medical Management of Chronic Issues   Health Maintenance    Shingrix, mammogram, TDAP    HPI:  Pt is a 87 y.o. female seen today for medical management of chronic diseases.    Patient has a history of hypertension, hyperlipidemia, dementia, B12 deficiency  Seen today for Routine Visit Her main complain was Cough Patient has h/o Chronic Cough has refused work up before per her previous PCP No Fever Mostly Dry Cough No Chest Pain or SOB Walks with her walker No Falls  Past Medical History:  Diagnosis Date   Arthritis    "right thumb" (01/29/2015)   Chronic shoulder pain    "between my shoulders" (01/29/2015)   Dizziness    Esophageal stricture 2009   Gallstones    GERD (gastroesophageal reflux disease) 2004   Hemorrhoids 2004   Hiatal hernia 2004    History of blood transfusion 1947   "appendix ruptured"   HOH (hard of hearing)    Hypertension    Rhinitis, allergic    Syncopal episodes    Past Surgical History:  Procedure Laterality Date   APPENDECTOMY  1947   "ruptured"   CARDIAC CATHETERIZATION  10/23/1999; 09/2014   EF 60% No significant CAD; patent coronary arteries.   CARPAL TUNNEL RELEASE Right 1990's   CHOLECYSTECTOMY N/A 01/29/2015   Procedure: LAPAROSCOPIC CHOLECYSTECTOMY;  Surgeon: Rolm Bookbinder, MD;  Location: [place];  Service: General;  Laterality: N/A;   DILATION AND CURETTAGE OF UTERUS  "couple"   ESOPHAGOGASTRODUODENOSCOPY (EGD) WITH ESOPHAGEAL DILATION  2-3 times   HEMORROIDECTOMY     KNEE ARTHROSCOPY Bilateral    right x 2   LAPAROSCOPIC CHOLECYSTECTOMY  01/29/2015   LEFT HEART CATHETERIZATION WITH CORONARY ANGIOGRAM N/A 09/16/2014   Procedure: LEFT HEART CATHETERIZATION WITH CORONARY ANGIOGRAM;  Surgeon: [name], MD;  Location: [facility] CATH LAB;  Service: Cardiovascular;  Laterality: N/A;   NM MYOVIEW LTD  06/01/2012   EF 71%   VAGINAL HYSTERECTOMY  1970's    Allergies  Allergen Reactions   Contrast Media [Iodinated Diagnostic Agents] Itching and Rash    Pt covered in rash, red.   Codeine Nausea And Vomiting    in large doses: can tolerate Tussionex    Allergies as of 12/23/2020       Reactions   Contrast Media [iodinated Diagnostic Agents] Itching, Rash   Pt covered  in rash, red.   Codeine Nausea And Vomiting   in large doses: can tolerate Tussionex        Medication List        Accurate as of December 23, 2020 10:23 AM. If you have any questions, ask your nurse or doctor.          STOP taking these medications    alum & mag hydroxide-simeth 200-200-20 MG/5ML suspension Commonly known as: MAALOX/MYLANTA Stopped by: [name], MD       TAKE these medications    amLODipine 2.5 MG tablet Commonly known as: NORVASC Take 1 tablet (2.5 mg total) by mouth daily.   ferrous  sulfate 325 (65 FE) MG tablet Take 325 mg by mouth. Monday, Wednesday and Fridays   furosemide 20 MG tablet Commonly known as: LASIX Take 20 mg by mouth daily.   HM Calcium-Vitamin D 600-800 MG-UNIT Tabs Generic drug: Calcium Carb-Cholecalciferol Take 1 tablet by mouth 2 (two) times daily.   omeprazole 20 MG capsule Commonly known as: PRILOSEC Take 20 mg by mouth daily.   rosuvastatin 20 MG tablet Commonly known as: CRESTOR Take 1 tablet (20 mg total) by mouth daily.   vitamin B-12 1000 MCG tablet Commonly known as: CYANOCOBALAMIN Take 1,000 mcg by mouth daily.        Review of Systems  Constitutional: Negative.   HENT: Negative.    Respiratory:  Positive for cough.   Cardiovascular:  Positive for leg swelling.  Gastrointestinal: Negative.   Genitourinary: Negative.   Musculoskeletal:  Positive for gait problem.  Skin: Negative.   Neurological:  Negative for dizziness.  Psychiatric/Behavioral:  Positive for confusion.    Immunization History  Administered Date(s) Administered   Influenza Split 03/16/2011, 03/01/2012   Influenza Whole 03/28/2007, 04/01/2009, 02/18/2010   Influenza, High Dose Seasonal PF 03/14/2013   Influenza,inj,Quad PF,6+ Mos 03/20/2014, 03/11/2015   Influenza-Unspecified 08/06/2018, 03/20/2020   Moderna SARS-COV2 Booster Vaccination 11/04/2020   Moderna Sars-Covid-2 Vaccination 06/11/2019, 07/09/2019   Pneumococcal Conjugate-13 02/25/2015   Pneumococcal Polysaccharide-23 03/28/2007   Td 12/20/2005   Pertinent  Health Maintenance Due  Topic Date Due   MAMMOGRAM  09/15/2013   INFLUENZA VACCINE  01/05/2021   DEXA SCAN  Completed   PNA vac Low Risk Adult  Completed   Fall Risk  04/27/2019 04/21/2018 02/15/2017 12/28/2016 08/25/2015  Falls in the past year? 0 0 No No No  Comment Emmi Telephone Survey: data to providers prior to load [employer] Telephone Survey: data to providers prior to load - [facility] Telephone Survey: data to providers prior to load -    Functional Status Survey:    Vitals:   12/23/20 1015  BP: 126/72  Pulse: 66  Resp: 18  Temp: (!) 97.2 F (36.2 C)  SpO2: 99%  Weight: 209 lb 12.8 oz (95.2 kg)  Height: 5' (1.524 m)   Body mass index is 40.97 kg/m. Physical Exam Constitutional:  Well-developed and well-nourished.  HENT:  Head: Normocephalic.  Mouth/Throat: Oropharynx is clear and moist.  Eyes: Pupils are equal, round, and reactive to light.  Neck: Neck supple.  Cardiovascular: Normal rate and normal heart sounds.  No murmur heard. Pulmonary/Chest: Effort normal and breath sounds normal. No respiratory distress. No wheezes. She has no rales.  Abdominal: Soft. Bowel sounds are normal. No distension. There is no tenderness. There is no rebound.  Musculoskeletal: Mild Edema Bilateral  Lymphadenopathy: none Neurological:No Focal Deficits Walks with her walker Skin: Skin is warm and dry.  Psychiatric: Normal  mood and affect. Behavior is normal. Thought content normal.   No flowsheet data found.  Labs reviewed:  Recent Labs    04/03/20 1211  AST 17  ALT 7  BILITOT 0.9  PROT 7.0   Recent Labs    04/03/20 1211 04/03/20 1211 07/05/20 0000 08/19/20 0000 08/27/20 0000 09/25/20 0000  WBC 10.9*   < > 11.2 8.6 6.4 7.3  NEUTROABS 6,507  --  7,134.00 4,395.00  --   --   HGB 12.4  --  10.3* 9.9* 9.5* 11.3*  HCT 39.3  --  32* 32* 31* 37  MCV 85.6  --   --   --   --   --   PLT 350  --  264 17* 244 275   < > = values in this interval not displayed.   Lab Results  Component Value Date   TSH 2.55 04/03/2020   Lab Results  Component Value Date   HGBA1C 5.8 07/11/2017   Lab Results  Component Value Date   CHOL 147 04/03/2020   HDL 51 04/03/2020   LDLCALC 73 04/03/2020   LDLDIRECT 161.2 07/18/2013   TRIG 143 04/03/2020   CHOLHDL 2.9 04/03/2020    Significant Diagnostic Results in last 30 days:  No results found.  Assessment/Plan Chronic cough Has had cough for Many years Refused work up  with Pulmonary Will Try Tessalon [place] BID Already on Prilosec Essential hypertension On Norvasc Edema of both lower extremities due to peripheral venous insufficiency Continue Lasix Cognitive impairment Continue in [place] MMSE 23 out of 30 per her previous PCP Dyslipidemia On statin LDL 73 in 10/21 Anemia, unspecified type On Iron Vit D Def On Supplement Family/ staff Communication:   Labs/tests ordered:

## 2020-12-28 ENCOUNTER — Telehealth: Payer: Self-pay | Admitting: Family

## 2020-12-28 DIAGNOSIS — R059 Cough, unspecified: Secondary | ICD-10-CM | POA: Diagnosis not present

## 2020-12-28 DIAGNOSIS — R509 Fever, unspecified: Secondary | ICD-10-CM | POA: Diagnosis not present

## 2020-12-28 NOTE — Telephone Encounter (Signed)
Facility Nurse called on call provider reports patient's cough worsening tessalon ineffective.Has a temp 100.9 COVID-19 test negative x 2.Stat chest X-rays,CBC/diff,BMP and urine specimen for U/A and C/S ordered.Nurse states labs will be done in the morning.please follow up.

## 2020-12-29 ENCOUNTER — Encounter: Payer: Self-pay | Admitting: Nurse Practitioner

## 2020-12-29 ENCOUNTER — Non-Acute Institutional Stay: Payer: Medicare Other | Admitting: Nurse Practitioner

## 2020-12-29 DIAGNOSIS — F0390 Unspecified dementia without behavioral disturbance: Secondary | ICD-10-CM

## 2020-12-29 DIAGNOSIS — R059 Cough, unspecified: Secondary | ICD-10-CM | POA: Diagnosis not present

## 2020-12-29 DIAGNOSIS — K219 Gastro-esophageal reflux disease without esophagitis: Secondary | ICD-10-CM | POA: Diagnosis not present

## 2020-12-29 DIAGNOSIS — N1831 Chronic kidney disease, stage 3a: Secondary | ICD-10-CM | POA: Diagnosis not present

## 2020-12-29 DIAGNOSIS — E785 Hyperlipidemia, unspecified: Secondary | ICD-10-CM

## 2020-12-29 DIAGNOSIS — N183 Chronic kidney disease, stage 3 unspecified: Secondary | ICD-10-CM | POA: Insufficient documentation

## 2020-12-29 DIAGNOSIS — I10 Essential (primary) hypertension: Secondary | ICD-10-CM

## 2020-12-29 DIAGNOSIS — I872 Venous insufficiency (chronic) (peripheral): Secondary | ICD-10-CM

## 2020-12-29 DIAGNOSIS — D649 Anemia, unspecified: Secondary | ICD-10-CM | POA: Diagnosis not present

## 2020-12-29 DIAGNOSIS — R051 Acute cough: Secondary | ICD-10-CM | POA: Diagnosis not present

## 2020-12-29 DIAGNOSIS — R5081 Fever presenting with conditions classified elsewhere: Secondary | ICD-10-CM | POA: Diagnosis not present

## 2020-12-29 LAB — CBC AND DIFFERENTIAL
HCT: 38 (ref 36–46)
Hemoglobin: 12.7 (ref 12.0–16.0)
Neutrophils Absolute: 6149
Platelets: 212 (ref 150–399)
WBC: 10.3

## 2020-12-29 LAB — CBC: RBC: 4.23 (ref 3.87–5.11)

## 2020-12-29 LAB — BASIC METABOLIC PANEL
BUN: 17 (ref 4–21)
CO2: 29 — AB (ref 13–22)
Chloride: 100 (ref 99–108)
Creatinine: 1.3 — AB (ref 0.5–1.1)
Glucose: 103
Potassium: 3.4 (ref 3.4–5.3)
Sodium: 137 (ref 137–147)

## 2020-12-29 LAB — COMPREHENSIVE METABOLIC PANEL: Calcium: 8.5 — AB (ref 8.7–10.7)

## 2020-12-29 NOTE — Assessment & Plan Note (Addendum)
Not new, but accompanied with generalized weakness is new, takes Benzonatate bid since 12/23/20, COVID negative x2,  already on Omeprazole. Pending UA C/S. 12/28/20 CXR normal CXR. 12/29/20 wbc 10.3, Hgb 12.7, plt 212, neutrophils 59.7, Na 137, K 3.4, Bun 17, creat 1.34, eGFR 38 12/29/20 DuoNeb q8hr x 72hrs, solumedrol dose pk.

## 2020-12-29 NOTE — Assessment & Plan Note (Signed)
12/29/20 wbc 10.3, Hgb 12.7, plt 212, neutrophils 59.7, Na 137, K 3.4, Bun 17, creat 1.34, eGFR 38 Encourage oral fluid intake, update BMP one week.

## 2020-12-29 NOTE — Progress Notes (Signed)
Location:   [place] Room Number: XI356 Place of Service:  ALF (13) Provider: Lennie Odor Jalyssa Fleisher NP  [name], MD  Patient Care Team: [name], MD as PCP - General (Internal Medicine) [name], MD as Consulting Physician (Cardiology)  Extended Emergency Contact Information Primary Emergency Contact: Osborne Casco Address: [address]          [place], [place] [address] [place] Phone: [phone] Mobile Phone: [phone] Relation: Niece  Code Status: DNR Goals of care: Advanced Directive information Advanced Directives 12/29/2020  Does Patient Have a Medical Advance Directive? Yes  Type of Advance Directive [place]  Does patient want to make changes to medical advance directive? No - Patient declined  Copy of [place] in Chart? Yes - validated most recent copy scanned in chart (See row information)  Would patient like information on creating a medical advance directive? -  Pre-existing out of facility DNR order (yellow form or pink MOST form) -     Chief Complaint  Patient presents with   Acute Visit    Patient complains of a cough    HPI:  Pt is a 87 y.o. female seen today for an acute visit for persisted hacking cough, worsened from her baseline, denied chest pain, SOB, or phlegm production, declined pulmonary evaluation, placed on Tessalon bid since 12/23/20.  The patient did admit her generalized malaise.   GERD, better indigestion, burping, frequent use of Mylanta, on Omeprazole.              Edema BLE, chronic, uses  Furosemide, Bun/creat 18/1.0 07/05/20. DOE, dry cough, not new,  EF 50-55% 06/01/2017.             HTN, takes Amlodipine, Bun/creat 18/1.0 07/05/20             Hyperlipidemia, LDL 73 04/03/20, takes Rosuvastatin             Dementia, functioning well in [place] [facility], 06/26/20 MMSE 23/30, failed clock drawing.              Anemia, Vit B12 228 04/03/20, added Vit B  216/22, Hgb 11.3 09/25/20  Past Medical History:  Diagnosis Date   Arthritis    "right thumb" (01/29/2015)   Chronic shoulder pain    "between my shoulders" (01/29/2015)   Dizziness    Esophageal stricture 2009   Gallstones    GERD (gastroesophageal reflux disease) 2004   Hemorrhoids 2004   Hiatal hernia 2004   History of blood transfusion 1947   "appendix ruptured"   HOH (hard of hearing)    Hypertension    Rhinitis, allergic    Syncopal episodes    Past Surgical History:  Procedure Laterality Date   APPENDECTOMY  1947   "ruptured"   CARDIAC CATHETERIZATION  10/23/1999; 09/2014   EF 60% No significant CAD; patent coronary arteries.   CARPAL TUNNEL RELEASE Right 1990's   CHOLECYSTECTOMY N/A 01/29/2015   Procedure: LAPAROSCOPIC CHOLECYSTECTOMY;  Surgeon: Rolm Bookbinder, MD;  Location: [place];  Service: General;  Laterality: N/A;   DILATION AND CURETTAGE OF UTERUS  "couple"   ESOPHAGOGASTRODUODENOSCOPY (EGD) WITH ESOPHAGEAL DILATION  2-3 times   HEMORROIDECTOMY     KNEE ARTHROSCOPY Bilateral    right x 2   LAPAROSCOPIC CHOLECYSTECTOMY  01/29/2015   LEFT HEART CATHETERIZATION WITH CORONARY ANGIOGRAM N/A 09/16/2014   Procedure: LEFT HEART CATHETERIZATION WITH CORONARY ANGIOGRAM;  Surgeon: [name], MD;  Location: [facility] CATH LAB;  Service: Cardiovascular;  Laterality: N/A;   NM MYOVIEW LTD  06/01/2012   EF 71%   VAGINAL HYSTERECTOMY  1970's    Allergies  Allergen Reactions   Contrast Media [Iodinated Diagnostic Agents] Itching and Rash    Pt covered in rash, red.   Codeine Nausea And Vomiting    in large doses: can tolerate Tussionex    Allergies as of 12/29/2020       Reactions   Contrast Media [iodinated Diagnostic Agents] Itching, Rash   Pt covered in rash, red.   Codeine Nausea And Vomiting   in large doses: can tolerate Tussionex        Medication List        Accurate as of December 29, 2020 11:59 PM. If you have any questions, ask your nurse or doctor.           acetaminophen 325 MG tablet Commonly known as: TYLENOL Take 650 mg by mouth every 4 (four) hours as needed.   amLODipine 2.5 MG tablet Commonly known as: NORVASC Take 1 tablet (2.5 mg total) by mouth daily.   benzonatate 100 MG capsule Commonly known as: TESSALON Take 1 capsule by mouth 2 (two) times daily.   ferrous sulfate 325 (65 FE) MG tablet Take 325 mg by mouth. Monday, Wednesday and Fridays   furosemide 20 MG tablet Commonly known as: LASIX Take 20 mg by mouth daily.   HM Calcium-Vitamin D 600-800 MG-UNIT Tabs Generic drug: Calcium Carb-Cholecalciferol Take 1 tablet by mouth 2 (two) times daily.   loratadine 10 MG tablet Commonly known as: CLARITIN Take 10 mg by mouth daily.   omeprazole 20 MG capsule Commonly known as: PRILOSEC Take 20 mg by mouth daily.   Robafen DM 10-100 MG/5ML liquid Generic drug: Dextromethorphan-guaiFENesin Take 5 mLs by mouth every 6 (six) hours as needed.   rosuvastatin 20 MG tablet Commonly known as: CRESTOR Take 1 tablet (20 mg total) by mouth daily.   vitamin B-12 1000 MCG tablet Commonly known as: CYANOCOBALAMIN Take 1,000 mcg by mouth daily.        Review of Systems  Constitutional:  Positive for fever. Negative for appetite change and fatigue.       Low grade temperature.   HENT:  Positive for hearing loss. Negative for congestion and trouble swallowing.   Respiratory:  Positive for cough. Negative for chest tightness, shortness of breath and wheezing.   Cardiovascular:  Positive for leg swelling. Negative for chest pain and palpitations.  Gastrointestinal:  Negative for abdominal pain and constipation.       Indigestion, heartburns, burping.   Genitourinary:  Positive for frequency. Negative for dysuria and urgency.  Musculoskeletal:  Positive for gait problem.  Skin:  Negative for color change.  Neurological:  Negative for speech difficulty, weakness and light-headedness.       Memory lapses.    Psychiatric/Behavioral:  Positive for confusion. Negative for behavioral problems and sleep disturbance. The patient is not nervous/anxious.    Immunization History  Administered Date(s) Administered   Influenza Split 03/16/2011, 03/01/2012   Influenza Whole 03/28/2007, 04/01/2009, 02/18/2010   Influenza, High Dose Seasonal PF 03/14/2013   Influenza,inj,Quad PF,6+ Mos 03/20/2014, 03/11/2015   Influenza-Unspecified 08/06/2018, 03/20/2020   Moderna SARS-COV2 Booster Vaccination 11/04/2020   Moderna Sars-Covid-2 Vaccination 06/11/2019, 07/09/2019   Pneumococcal Conjugate-13 02/25/2015   Pneumococcal Polysaccharide-23 03/28/2007   Td 12/20/2005   Pertinent  Health Maintenance Due  Topic Date Due   MAMMOGRAM  09/15/2013  INFLUENZA VACCINE  01/05/2021   DEXA SCAN  Completed   PNA vac Low Risk Adult  Completed   Fall Risk  04/27/2019 04/21/2018 02/15/2017 12/28/2016 08/25/2015  Falls in the past year? 0 0 No No No  Comment Emmi Telephone Survey: data to providers prior to load [employer] Telephone Survey: data to providers prior to load - Emmi Telephone Survey: data to providers prior to load -   Functional Status Survey:    Vitals:   12/29/20 1529  BP: 118/64  Pulse: 90  Resp: 20  Temp: (!) 100.4 F (38 C)  SpO2: 91%  Weight: 209 lb 12.8 oz (95.2 kg)  Height: 5' (1.524 m)   Body mass index is 40.97 kg/m. Physical Exam Constitutional:      Comments: Appears tired.   HENT:     Head: Normocephalic and atraumatic.     Mouth/Throat:     Mouth: Mucous membranes are moist.  Eyes:     Extraocular Movements: Extraocular movements intact.     Conjunctiva/sclera: Conjunctivae normal.     Pupils: Pupils are equal, round, and reactive to light.  Cardiovascular:     Rate and Rhythm: Normal rate and regular rhythm.     Heart sounds: No murmur heard. Pulmonary:     Effort: Pulmonary effort is normal. No respiratory distress.     Breath sounds: No wheezing, rhonchi or rales.  Abdominal:      General: Bowel sounds are normal.     Palpations: Abdomen is soft.     Tenderness: There is no abdominal tenderness.  Musculoskeletal:     Cervical back: Normal range of motion and neck supple.     Right lower leg: Edema present.     Left lower leg: Edema present.     Comments: 1+ edema BLE, chronic venous insufficiency skin changes.   Skin:    General: Skin is warm and dry.  Neurological:     General: No focal deficit present.     Mental Status: She is alert. Mental status is at baseline.     Gait: Gait abnormal.     Comments: Oriented to person, her room on unit.   Psychiatric:        Mood and Affect: Mood normal.        Behavior: Behavior normal.    Labs reviewed: Recent Labs    04/03/20 1211 05/09/20 0000 07/05/20 0000  NA 139 140 137  K 3.4* 4.0 4.4  CL 98 103 104  CO2 27 24* 28*  GLUCOSE 108*  --   --   BUN '22 21 18  ' CREATININE 1.16* 1.2* 1.0  CALCIUM 9.9 9.0 8.8   Recent Labs    04/03/20 1211  AST 17  ALT 7  BILITOT 0.9  PROT 7.0   Recent Labs    04/03/20 1211 04/03/20 1211 07/05/20 0000 08/19/20 0000 08/27/20 0000 09/25/20 0000  WBC 10.9*   < > 11.2 8.6 6.4 7.3  NEUTROABS 6,507  --  7,134.00 4,395.00  --   --   HGB 12.4  --  10.3* 9.9* 9.5* 11.3*  HCT 39.3  --  32* 32* 31* 37  MCV 85.6  --   --   --   --   --   PLT 350  --  264 17* 244 275   < > = values in this interval not displayed.   Lab Results  Component Value Date   TSH 2.55 04/03/2020   Lab Results  Component Value Date  HGBA1C 5.8 07/11/2017   Lab Results  Component Value Date   CHOL 147 04/03/2020   HDL 51 04/03/2020   LDLCALC 73 04/03/2020   LDLDIRECT 161.2 07/18/2013   TRIG 143 04/03/2020   CHOLHDL 2.9 04/03/2020    Significant Diagnostic Results in last 30 days:  No results found.  Assessment/Plan: COUGH, CHRONIC Not new, but accompanied with generalized weakness is new, takes Benzonatate bid since 12/23/20, COVID negative x2,  already on Omeprazole. Pending  UA C/S. 12/28/20 CXR normal CXR. 12/29/20 wbc 10.3, Hgb 12.7, plt 212, neutrophils 59.7, Na 137, K 3.4, Bun 17, creat 1.34, eGFR 38 12/29/20 DuoNeb q8hr x 72hrs, solumedrol dose pk.   CKD (chronic kidney disease) stage 3, GFR 30-59 ml/min (HCC) 12/29/20 wbc 10.3, Hgb 12.7, plt 212, neutrophils 59.7, Na 137, K 3.4, Bun 17, creat 1.34, eGFR 38 Encourage oral fluid intake, update BMP one week.    GERD better indigestion, burping, frequent use of Mylanta, on Omeprazole.   Edema of both lower extremities due to peripheral venous insufficiency chronic, uses  Furosemide, Bun/creat 18/1.0 07/05/20. DOE, dry cough, not new,  EF 50-55% 06/01/2017.  Essential hypertension Blood pressure is controlled, takes Amlodipine, Bun/creat 18/1.0 07/05/20  Dyslipidemia LDL 73 04/03/20, takes Rosuvastatin  Senile dementia ([place]) functioning well in [facility], 06/26/20 MMSE 23/30, failed clock drawing.   Anemia Vit B12 228 04/03/20, added Vit B 216/22, Hgb 11.3 09/25/20    Family/ staff Communication: plan of care reviewed with the patient and charge nurse.   Labs/tests ordered:  none  Time spend 40 minutes.

## 2020-12-30 ENCOUNTER — Encounter: Payer: Self-pay | Admitting: Nurse Practitioner

## 2020-12-30 NOTE — Assessment & Plan Note (Signed)
LDL 73 04/03/20, takes Rosuvastatin °

## 2020-12-30 NOTE — Assessment & Plan Note (Signed)
chronic, uses  Furosemide, Bun/creat 18/1.0 07/05/20. DOE, dry cough, not new,  EF 50-55% 06/01/2017.

## 2020-12-30 NOTE — Assessment & Plan Note (Signed)
better indigestion, burping, frequent use of Mylanta, on Omeprazole.  ?

## 2020-12-30 NOTE — Assessment & Plan Note (Signed)
functioning well in [place], 06/26/20 MMSE 23/30, failed clock drawing.

## 2020-12-30 NOTE — Assessment & Plan Note (Signed)
Blood pressure is controlled, takes Amlodipine, Bun/creat 18/1.0 07/05/20

## 2020-12-30 NOTE — Assessment & Plan Note (Signed)
Vit B12 228 04/03/20, added Vit B 216/22, Hgb 11.3 09/25/20

## 2021-01-06 DIAGNOSIS — R5081 Fever presenting with conditions classified elsewhere: Secondary | ICD-10-CM | POA: Diagnosis not present

## 2021-01-06 DIAGNOSIS — R051 Acute cough: Secondary | ICD-10-CM | POA: Diagnosis not present

## 2021-01-06 LAB — BASIC METABOLIC PANEL
BUN: 26 — AB (ref 4–21)
CO2: 33 — AB (ref 13–22)
Chloride: 98 — AB (ref 99–108)
Creatinine: 1.2 — AB (ref 0.5–1.1)
Glucose: 80
Potassium: 3.6 (ref 3.4–5.3)
Sodium: 139 (ref 137–147)

## 2021-01-06 LAB — COMPREHENSIVE METABOLIC PANEL: Calcium: 8.6 — AB (ref 8.7–10.7)

## 2021-01-08 ENCOUNTER — Non-Acute Institutional Stay: Payer: Medicare Other | Admitting: Nurse Practitioner

## 2021-01-08 DIAGNOSIS — I872 Venous insufficiency (chronic) (peripheral): Secondary | ICD-10-CM

## 2021-01-08 DIAGNOSIS — N390 Urinary tract infection, site not specified: Secondary | ICD-10-CM | POA: Diagnosis not present

## 2021-01-08 DIAGNOSIS — N1831 Chronic kidney disease, stage 3a: Secondary | ICD-10-CM

## 2021-01-08 DIAGNOSIS — K219 Gastro-esophageal reflux disease without esophagitis: Secondary | ICD-10-CM

## 2021-01-08 DIAGNOSIS — F0390 Unspecified dementia without behavioral disturbance: Secondary | ICD-10-CM | POA: Diagnosis not present

## 2021-01-08 DIAGNOSIS — E785 Hyperlipidemia, unspecified: Secondary | ICD-10-CM

## 2021-01-08 DIAGNOSIS — R531 Weakness: Secondary | ICD-10-CM

## 2021-01-08 DIAGNOSIS — I10 Essential (primary) hypertension: Secondary | ICD-10-CM

## 2021-01-08 DIAGNOSIS — D649 Anemia, unspecified: Secondary | ICD-10-CM | POA: Diagnosis not present

## 2021-01-08 NOTE — Assessment & Plan Note (Signed)
Vit B12 228 04/03/20, added Vit B 216/22

## 2021-01-08 NOTE — Assessment & Plan Note (Signed)
Blood pressure is controlled, takes Amlodipine, Bun/creat 26/1.19 01/06/21

## 2021-01-08 NOTE — Assessment & Plan Note (Signed)
Edema BLE-not apparent, hold  Furosemide x3 days,  DOE, dry cough, not new,  EF 50-55% 06/01/2017.

## 2021-01-08 NOTE — Assessment & Plan Note (Signed)
last day of the 7 day coure of Amoxicillin stared 01/01/21, urine culture E Coli >100,000c/ml 01/01/21

## 2021-01-08 NOTE — Assessment & Plan Note (Signed)
UTI is contributory, the patient stated its getting better, will have therapy to eval/tx.

## 2021-01-08 NOTE — Assessment & Plan Note (Signed)
functioning well in [place], 06/26/20 MMSE 23/30, failed clock drawing.

## 2021-01-08 NOTE — Progress Notes (Signed)
Location:   [facility] Room Number: [phone] Place of Service:  ALF (13) Provider: Lennie Odor Lora Chavers NP  [name], MD  Patient Care Team: [name], MD as PCP - General (Internal Medicine) [name], MD as Consulting Physician (Cardiology)  Extended Emergency Contact Information Primary Emergency Contact: Osborne Casco Address: [address]          [place], [place] [address] [place] Phone: [phone] Mobile Phone: [phone] Relation: Niece  Code Status: DNR Goals of care: Advanced Directive information Advanced Directives 01/12/2021  Does Patient Have a Medical Advance Directive? Yes  Type of Advance Directive [place]  Does patient want to make changes to medical advance directive? No - Patient declined  Copy of [place] in Chart? Yes - validated most recent copy scanned in chart (See row information)  Would patient like information on creating a medical advance directive? -  Pre-existing out of facility DNR order (yellow form or pink MOST form) -     Chief Complaint  Patient presents with   Acute Visit    Cough, generalized weakness.      HPI:  Pt is a 87 y.o. female seen today for an acute visit for generalized weakness, denied dysuria, abd pain, nausea, vomiting, SOB, or phlegm production. Her cough is at her baseline.   CKD, stage 3 Bun/creat 26/1.19, eGFR 43 01/06/21  UTI last day of the 7 day coure of Amoxicillin stared 01/01/21, urine culture E Coli >100,000c/ml 01/01/21  GERD, better indigestion, burping, frequent use of Mylanta, on Omeprazole.              Edema BLE-not apparent, uses  Furosemide, DOE, dry cough, not new,  EF 50-55% 06/01/2017.             HTN, takes Amlodipine, Bun/creat 26/1.19 01/06/21             Hyperlipidemia, LDL 73 04/03/20, takes Rosuvastatin             Dementia, functioning well in [facility], 06/26/20 MMSE 23/30, failed clock drawing.               Anemia, Vit B12 228 04/03/20, added Vit B 216/22  Past Medical History:  Diagnosis Date   Arthritis    "right thumb" (01/29/2015)   Chronic shoulder pain    "between my shoulders" (01/29/2015)   Dizziness    Esophageal stricture 2009   Gallstones    GERD (gastroesophageal reflux disease) 2004   Hemorrhoids 2004   Hiatal hernia 2004   History of blood transfusion 1947   "appendix ruptured"   HOH (hard of hearing)    Hypertension    Rhinitis, allergic    Syncopal episodes    Past Surgical History:  Procedure Laterality Date   APPENDECTOMY  1947   "ruptured"   CARDIAC CATHETERIZATION  10/23/1999; 09/2014   EF 60% No significant CAD; patent coronary arteries.   CARPAL TUNNEL RELEASE Right 1990's   CHOLECYSTECTOMY N/A 01/29/2015   Procedure: LAPAROSCOPIC CHOLECYSTECTOMY;  Surgeon: Rolm Bookbinder, MD;  Location: [facility];  Service: General;  Laterality: N/A;   DILATION AND CURETTAGE OF UTERUS  "couple"   ESOPHAGOGASTRODUODENOSCOPY (EGD) WITH ESOPHAGEAL DILATION  2-3 times   HEMORROIDECTOMY     KNEE ARTHROSCOPY Bilateral    right x 2   LAPAROSCOPIC CHOLECYSTECTOMY  01/29/2015   LEFT HEART CATHETERIZATION WITH CORONARY ANGIOGRAM N/A 09/16/2014   Procedure: LEFT HEART CATHETERIZATION  WITH CORONARY ANGIOGRAM;  Surgeon: [name], MD;  [facility] CATH LAB;  Service: Cardiovascular;  Laterality: N/A;   NM MYOVIEW LTD  06/01/2012   EF 71%   VAGINAL HYSTERECTOMY  1970's    Allergies  Allergen Reactions   Contrast Media [Iodinated Diagnostic Agents] Itching and Rash    Pt covered in rash, red.   Codeine Nausea And Vomiting    in large doses: can tolerate Tussionex    Allergies as of 01/08/2021       Reactions   Contrast Media [iodinated Diagnostic Agents] Itching, Rash   Pt covered in rash, red.   Codeine Nausea And Vomiting   in large doses: can tolerate Tussionex        Medication List        Accurate as of January 08, 2021 11:59 PM. If you have any questions, ask  your nurse or doctor.          acetaminophen 325 MG tablet Commonly known as: TYLENOL Take 650 mg by mouth every 4 (four) hours as needed.   amLODipine 2.5 MG tablet Commonly known as: NORVASC Take 1 tablet (2.5 mg total) by mouth daily.   benzonatate 100 MG capsule Commonly known as: TESSALON Take 1 capsule by mouth 2 (two) times daily.   Dextromethorphan-guaiFENesin 10-100 MG/5ML liquid Take 5 mLs by mouth every 6 (six) hours as needed.   ferrous sulfate 325 (65 FE) MG tablet Take 325 mg by mouth. Monday, Wednesday and Fridays   furosemide 20 MG tablet Commonly known as: LASIX Take 20 mg by mouth daily.   HM Calcium-Vitamin D 600-800 MG-UNIT Tabs Generic drug: Calcium Carb-Cholecalciferol Take 1 tablet by mouth 2 (two) times daily.   loratadine 10 MG tablet Commonly known as: CLARITIN Take 10 mg by mouth daily.   omeprazole 20 MG capsule Commonly known as: PRILOSEC Take 20 mg by mouth daily.   rosuvastatin 20 MG tablet Commonly known as: CRESTOR Take 1 tablet (20 mg total) by mouth daily.   vitamin B-12 1000 MCG tablet Commonly known as: CYANOCOBALAMIN Take 1,000 mcg by mouth daily.        Review of Systems  Constitutional:  Positive for activity change, appetite change and fatigue. Negative for fever.       Low grade temperature.   HENT:  Positive for hearing loss. Negative for congestion and trouble swallowing.   Respiratory:  Positive for cough. Negative for shortness of breath.   Cardiovascular:  Negative for leg swelling.  Gastrointestinal:  Negative for abdominal pain and constipation.       Indigestion, heartburns, burping.   Genitourinary:  Positive for frequency. Negative for dysuria and urgency.       Baseline urinary frequency  Musculoskeletal:  Positive for gait problem.  Skin:  Negative for color change.  Neurological:  Negative for speech difficulty, weakness and light-headedness.       Memory lapses.   Psychiatric/Behavioral:   Positive for confusion. Negative for behavioral problems and sleep disturbance. The patient is not nervous/anxious.    Immunization History  Administered Date(s) Administered   Influenza Split 03/16/2011, 03/01/2012   Influenza Whole 03/28/2007, 04/01/2009, 02/18/2010   Influenza, High Dose Seasonal PF 03/14/2013   Influenza,inj,Quad PF,6+ Mos 03/20/2014, 03/11/2015   Influenza-Unspecified 08/06/2018, 03/20/2020   Moderna SARS-COV2 Booster Vaccination 11/04/2020   Moderna Sars-Covid-2 Vaccination 06/11/2019, 07/09/2019   Pneumococcal Conjugate-13 02/25/2015   Pneumococcal Polysaccharide-23 03/28/2007   Td 12/20/2005   Pertinent  Health Maintenance Due  Topic Date  Due   MAMMOGRAM  09/15/2013   INFLUENZA VACCINE  01/05/2021   DEXA SCAN  Completed   PNA vac Low Risk Adult  Completed   Fall Risk  04/27/2019 04/21/2018 02/15/2017 12/28/2016 08/25/2015  Falls in the past year? 0 0 No No No  Comment [facility] Telephone Survey: data to providers prior to load [employer] Telephone Survey: data to providers prior to load - [facility] Telephone Survey: data to providers prior to load -   Functional Status Survey:    Vitals:   01/12/21 1338  BP: (!) 150/85  Pulse: 64  Resp: 16  Temp: (!) 97.4 F (36.3 C)  SpO2: 98%  Weight: 198 lb 9.6 oz (90.1 kg)  Height: 5' (1.524 m)   Body mass index is 38.79 kg/m. Physical Exam Constitutional:      Comments: Appears tired.   HENT:     Head: Normocephalic and atraumatic.     Mouth/Throat:     Mouth: Mucous membranes are dry.  Eyes:     Extraocular Movements: Extraocular movements intact.     Conjunctiva/sclera: Conjunctivae normal.     Pupils: Pupils are equal, round, and reactive to light.  Cardiovascular:     Rate and Rhythm: Normal rate and regular rhythm.     Heart sounds: No murmur heard. Pulmonary:     Effort: Pulmonary effort is normal.     Breath sounds: No wheezing or rales.  Abdominal:     General: Bowel sounds are normal.     Palpations:  Abdomen is soft.     Tenderness: There is no abdominal tenderness.  Musculoskeletal:     Cervical back: Normal range of motion and neck supple.     Right lower leg: No edema.     Left lower leg: No edema.     Comments: BLE chronic venous insufficiency skin changes.   Skin:    General: Skin is warm and dry.  Neurological:     General: No focal deficit present.     Mental Status: She is alert. Mental status is at baseline.     Gait: Gait abnormal.     Comments: Oriented to person, her room on unit.   Psychiatric:        Mood and Affect: Mood normal.        Behavior: Behavior normal.    Labs reviewed: Recent Labs    04/03/20 1211 05/09/20 0000 07/05/20 0000  NA 139 140 137  K 3.4* 4.0 4.4  CL 98 103 104  CO2 27 24* 28*  GLUCOSE 108*  --   --   BUN '22 21 18  ' CREATININE 1.16* 1.2* 1.0  CALCIUM 9.9 9.0 8.8   Recent Labs    04/03/20 1211  AST 17  ALT 7  BILITOT 0.9  PROT 7.0   Recent Labs    04/03/20 1211 04/03/20 1211 07/05/20 0000 08/19/20 0000 08/27/20 0000 09/25/20 0000  WBC 10.9*   < > 11.2 8.6 6.4 7.3  NEUTROABS 6,507  --  7,134.00 4,395.00  --   --   HGB 12.4  --  10.3* 9.9* 9.5* 11.3*  HCT 39.3  --  32* 32* 31* 37  MCV 85.6  --   --   --   --   --   PLT 350  --  264 17* 244 275   < > = values in this interval not displayed.   Lab Results  Component Value Date   TSH 2.55 04/03/2020   Lab Results  Component Value Date   HGBA1C 5.8 07/11/2017   Lab Results  Component Value Date   CHOL 147 04/03/2020   HDL 51 04/03/2020   LDLCALC 73 04/03/2020   LDLDIRECT 161.2 07/18/2013   TRIG 143 04/03/2020   CHOLHDL 2.9 04/03/2020    Significant Diagnostic Results in last 30 days:  No results found.  Assessment/Plan: CKD (chronic kidney disease) stage 3, GFR 30-59 ml/min (HCC) stage 3 Bun/creat 26/1.19, eGFR 43 01/06/21, will hold Furosemide x 3 days, encourage oral fluid intakes, f/u BMP  Generalized weakness UTI is contributory, the patient stated  its getting better, will have therapy to eval/tx.   UTI (urinary tract infection) last day of the 7 day coure of Amoxicillin stared 01/01/21, urine culture E Coli >100,000c/ml 01/01/21  GERD better indigestion, burping, frequent use of Mylanta, on Omeprazole.   Edema of both lower extremities due to peripheral venous insufficiency Edema BLE-not apparent, hold  Furosemide x3 days,  DOE, dry cough, not new,  EF 50-55% 06/01/2017.  Essential hypertension Blood pressure is controlled, takes Amlodipine, Bun/creat 26/1.19 01/06/21  Dyslipidemia LDL 73 04/03/20, takes Rosuvastatin  Senile dementia ([place]) functioning well in [facility], 06/26/20 MMSE 23/30, failed clock drawing.   Anemia Vit B12 228 04/03/20, added Vit B 216/22    Family/ staff Communication: plan of care reviewed with the patient and charge nurse.   Labs/tests ordered:  BMP one week  Time spend 40 minutes.

## 2021-01-08 NOTE — Assessment & Plan Note (Addendum)
stage 3 Bun/creat 26/1.19, eGFR 43 01/06/21, will hold Furosemide x 3 days, encourage oral fluid intakes, f/u BMP

## 2021-01-08 NOTE — Assessment & Plan Note (Signed)
LDL 73 04/03/20, takes Rosuvastatin °

## 2021-01-08 NOTE — Assessment & Plan Note (Signed)
better indigestion, burping, frequent use of Mylanta, on Omeprazole.  ?

## 2021-01-12 ENCOUNTER — Encounter: Payer: Self-pay | Admitting: Nurse Practitioner

## 2021-01-15 DIAGNOSIS — R609 Edema, unspecified: Secondary | ICD-10-CM | POA: Diagnosis not present

## 2021-01-15 DIAGNOSIS — I10 Essential (primary) hypertension: Secondary | ICD-10-CM | POA: Diagnosis not present

## 2021-01-15 LAB — BASIC METABOLIC PANEL
BUN: 15 (ref 4–21)
CO2: 29 — AB (ref 13–22)
Chloride: 103 (ref 99–108)
Creatinine: 1.1 (ref 0.5–1.1)
Glucose: 94
Potassium: 4.4 (ref 3.4–5.3)
Sodium: 140 (ref 137–147)

## 2021-01-15 LAB — COMPREHENSIVE METABOLIC PANEL: Calcium: 8.8 (ref 8.7–10.7)

## 2021-02-04 DIAGNOSIS — R2681 Unsteadiness on feet: Secondary | ICD-10-CM | POA: Diagnosis not present

## 2021-02-04 DIAGNOSIS — M5459 Other low back pain: Secondary | ICD-10-CM | POA: Diagnosis not present

## 2021-02-04 DIAGNOSIS — M6281 Muscle weakness (generalized): Secondary | ICD-10-CM | POA: Diagnosis not present

## 2021-02-04 DIAGNOSIS — Z9181 History of falling: Secondary | ICD-10-CM | POA: Diagnosis not present

## 2021-02-04 DIAGNOSIS — N390 Urinary tract infection, site not specified: Secondary | ICD-10-CM | POA: Diagnosis not present

## 2021-02-05 DIAGNOSIS — R2681 Unsteadiness on feet: Secondary | ICD-10-CM | POA: Diagnosis not present

## 2021-02-05 DIAGNOSIS — Z9181 History of falling: Secondary | ICD-10-CM | POA: Diagnosis not present

## 2021-02-05 DIAGNOSIS — M6281 Muscle weakness (generalized): Secondary | ICD-10-CM | POA: Diagnosis not present

## 2021-02-05 DIAGNOSIS — N390 Urinary tract infection, site not specified: Secondary | ICD-10-CM | POA: Diagnosis not present

## 2021-02-05 DIAGNOSIS — M5459 Other low back pain: Secondary | ICD-10-CM | POA: Diagnosis not present

## 2021-02-12 DIAGNOSIS — N390 Urinary tract infection, site not specified: Secondary | ICD-10-CM | POA: Diagnosis not present

## 2021-02-12 DIAGNOSIS — R2681 Unsteadiness on feet: Secondary | ICD-10-CM | POA: Diagnosis not present

## 2021-02-12 DIAGNOSIS — M5459 Other low back pain: Secondary | ICD-10-CM | POA: Diagnosis not present

## 2021-02-12 DIAGNOSIS — M6281 Muscle weakness (generalized): Secondary | ICD-10-CM | POA: Diagnosis not present

## 2021-02-12 DIAGNOSIS — Z9181 History of falling: Secondary | ICD-10-CM | POA: Diagnosis not present

## 2021-02-13 DIAGNOSIS — M5459 Other low back pain: Secondary | ICD-10-CM | POA: Diagnosis not present

## 2021-02-13 DIAGNOSIS — R2681 Unsteadiness on feet: Secondary | ICD-10-CM | POA: Diagnosis not present

## 2021-02-13 DIAGNOSIS — M6281 Muscle weakness (generalized): Secondary | ICD-10-CM | POA: Diagnosis not present

## 2021-02-13 DIAGNOSIS — Z9181 History of falling: Secondary | ICD-10-CM | POA: Diagnosis not present

## 2021-02-13 DIAGNOSIS — N390 Urinary tract infection, site not specified: Secondary | ICD-10-CM | POA: Diagnosis not present

## 2021-02-17 DIAGNOSIS — N390 Urinary tract infection, site not specified: Secondary | ICD-10-CM | POA: Diagnosis not present

## 2021-02-17 DIAGNOSIS — M6281 Muscle weakness (generalized): Secondary | ICD-10-CM | POA: Diagnosis not present

## 2021-02-17 DIAGNOSIS — M5459 Other low back pain: Secondary | ICD-10-CM | POA: Diagnosis not present

## 2021-02-17 DIAGNOSIS — R2681 Unsteadiness on feet: Secondary | ICD-10-CM | POA: Diagnosis not present

## 2021-02-17 DIAGNOSIS — Z9181 History of falling: Secondary | ICD-10-CM | POA: Diagnosis not present

## 2021-02-19 DIAGNOSIS — M5459 Other low back pain: Secondary | ICD-10-CM | POA: Diagnosis not present

## 2021-02-19 DIAGNOSIS — R2681 Unsteadiness on feet: Secondary | ICD-10-CM | POA: Diagnosis not present

## 2021-02-19 DIAGNOSIS — Z9181 History of falling: Secondary | ICD-10-CM | POA: Diagnosis not present

## 2021-02-19 DIAGNOSIS — N390 Urinary tract infection, site not specified: Secondary | ICD-10-CM | POA: Diagnosis not present

## 2021-02-19 DIAGNOSIS — M6281 Muscle weakness (generalized): Secondary | ICD-10-CM | POA: Diagnosis not present

## 2021-02-20 DIAGNOSIS — N390 Urinary tract infection, site not specified: Secondary | ICD-10-CM | POA: Diagnosis not present

## 2021-02-20 DIAGNOSIS — M5459 Other low back pain: Secondary | ICD-10-CM | POA: Diagnosis not present

## 2021-02-20 DIAGNOSIS — R2681 Unsteadiness on feet: Secondary | ICD-10-CM | POA: Diagnosis not present

## 2021-02-20 DIAGNOSIS — M6281 Muscle weakness (generalized): Secondary | ICD-10-CM | POA: Diagnosis not present

## 2021-02-20 DIAGNOSIS — Z9181 History of falling: Secondary | ICD-10-CM | POA: Diagnosis not present

## 2021-02-24 DIAGNOSIS — Z9181 History of falling: Secondary | ICD-10-CM | POA: Diagnosis not present

## 2021-02-24 DIAGNOSIS — N390 Urinary tract infection, site not specified: Secondary | ICD-10-CM | POA: Diagnosis not present

## 2021-02-24 DIAGNOSIS — R2681 Unsteadiness on feet: Secondary | ICD-10-CM | POA: Diagnosis not present

## 2021-02-24 DIAGNOSIS — M6281 Muscle weakness (generalized): Secondary | ICD-10-CM | POA: Diagnosis not present

## 2021-02-24 DIAGNOSIS — M5459 Other low back pain: Secondary | ICD-10-CM | POA: Diagnosis not present

## 2021-02-24 DIAGNOSIS — Z23 Encounter for immunization: Secondary | ICD-10-CM | POA: Diagnosis not present

## 2021-02-26 DIAGNOSIS — M5459 Other low back pain: Secondary | ICD-10-CM | POA: Diagnosis not present

## 2021-02-26 DIAGNOSIS — N390 Urinary tract infection, site not specified: Secondary | ICD-10-CM | POA: Diagnosis not present

## 2021-02-26 DIAGNOSIS — M6281 Muscle weakness (generalized): Secondary | ICD-10-CM | POA: Diagnosis not present

## 2021-02-26 DIAGNOSIS — Z9181 History of falling: Secondary | ICD-10-CM | POA: Diagnosis not present

## 2021-02-26 DIAGNOSIS — R2681 Unsteadiness on feet: Secondary | ICD-10-CM | POA: Diagnosis not present

## 2021-02-27 DIAGNOSIS — Z9181 History of falling: Secondary | ICD-10-CM | POA: Diagnosis not present

## 2021-02-27 DIAGNOSIS — M6281 Muscle weakness (generalized): Secondary | ICD-10-CM | POA: Diagnosis not present

## 2021-02-27 DIAGNOSIS — R2681 Unsteadiness on feet: Secondary | ICD-10-CM | POA: Diagnosis not present

## 2021-02-27 DIAGNOSIS — N390 Urinary tract infection, site not specified: Secondary | ICD-10-CM | POA: Diagnosis not present

## 2021-02-27 DIAGNOSIS — M5459 Other low back pain: Secondary | ICD-10-CM | POA: Diagnosis not present

## 2021-03-03 DIAGNOSIS — M6281 Muscle weakness (generalized): Secondary | ICD-10-CM | POA: Diagnosis not present

## 2021-03-03 DIAGNOSIS — R2681 Unsteadiness on feet: Secondary | ICD-10-CM | POA: Diagnosis not present

## 2021-03-03 DIAGNOSIS — N390 Urinary tract infection, site not specified: Secondary | ICD-10-CM | POA: Diagnosis not present

## 2021-03-03 DIAGNOSIS — M5459 Other low back pain: Secondary | ICD-10-CM | POA: Diagnosis not present

## 2021-03-03 DIAGNOSIS — Z9181 History of falling: Secondary | ICD-10-CM | POA: Diagnosis not present

## 2021-03-05 DIAGNOSIS — M6281 Muscle weakness (generalized): Secondary | ICD-10-CM | POA: Diagnosis not present

## 2021-03-05 DIAGNOSIS — Z9181 History of falling: Secondary | ICD-10-CM | POA: Diagnosis not present

## 2021-03-05 DIAGNOSIS — R2681 Unsteadiness on feet: Secondary | ICD-10-CM | POA: Diagnosis not present

## 2021-03-05 DIAGNOSIS — N390 Urinary tract infection, site not specified: Secondary | ICD-10-CM | POA: Diagnosis not present

## 2021-03-05 DIAGNOSIS — M5459 Other low back pain: Secondary | ICD-10-CM | POA: Diagnosis not present

## 2021-03-06 DIAGNOSIS — M6281 Muscle weakness (generalized): Secondary | ICD-10-CM | POA: Diagnosis not present

## 2021-03-06 DIAGNOSIS — M5459 Other low back pain: Secondary | ICD-10-CM | POA: Diagnosis not present

## 2021-03-06 DIAGNOSIS — Z9181 History of falling: Secondary | ICD-10-CM | POA: Diagnosis not present

## 2021-03-06 DIAGNOSIS — N390 Urinary tract infection, site not specified: Secondary | ICD-10-CM | POA: Diagnosis not present

## 2021-03-06 DIAGNOSIS — R2681 Unsteadiness on feet: Secondary | ICD-10-CM | POA: Diagnosis not present

## 2021-03-10 DIAGNOSIS — R2681 Unsteadiness on feet: Secondary | ICD-10-CM | POA: Diagnosis not present

## 2021-03-10 DIAGNOSIS — N390 Urinary tract infection, site not specified: Secondary | ICD-10-CM | POA: Diagnosis not present

## 2021-03-10 DIAGNOSIS — M5459 Other low back pain: Secondary | ICD-10-CM | POA: Diagnosis not present

## 2021-03-10 DIAGNOSIS — Z9181 History of falling: Secondary | ICD-10-CM | POA: Diagnosis not present

## 2021-03-10 DIAGNOSIS — M6281 Muscle weakness (generalized): Secondary | ICD-10-CM | POA: Diagnosis not present

## 2021-03-12 DIAGNOSIS — Z9181 History of falling: Secondary | ICD-10-CM | POA: Diagnosis not present

## 2021-03-12 DIAGNOSIS — R2681 Unsteadiness on feet: Secondary | ICD-10-CM | POA: Diagnosis not present

## 2021-03-12 DIAGNOSIS — M5459 Other low back pain: Secondary | ICD-10-CM | POA: Diagnosis not present

## 2021-03-12 DIAGNOSIS — M6281 Muscle weakness (generalized): Secondary | ICD-10-CM | POA: Diagnosis not present

## 2021-03-12 DIAGNOSIS — N390 Urinary tract infection, site not specified: Secondary | ICD-10-CM | POA: Diagnosis not present

## 2021-03-13 ENCOUNTER — Non-Acute Institutional Stay: Payer: Medicare Other | Admitting: Nurse Practitioner

## 2021-03-13 ENCOUNTER — Encounter: Payer: Self-pay | Admitting: Nurse Practitioner

## 2021-03-13 DIAGNOSIS — M5459 Other low back pain: Secondary | ICD-10-CM | POA: Diagnosis not present

## 2021-03-13 DIAGNOSIS — I872 Venous insufficiency (chronic) (peripheral): Secondary | ICD-10-CM | POA: Diagnosis not present

## 2021-03-13 DIAGNOSIS — F0390 Unspecified dementia without behavioral disturbance: Secondary | ICD-10-CM

## 2021-03-13 DIAGNOSIS — I10 Essential (primary) hypertension: Secondary | ICD-10-CM | POA: Diagnosis not present

## 2021-03-13 DIAGNOSIS — E785 Hyperlipidemia, unspecified: Secondary | ICD-10-CM | POA: Diagnosis not present

## 2021-03-13 DIAGNOSIS — N1831 Chronic kidney disease, stage 3a: Secondary | ICD-10-CM | POA: Diagnosis not present

## 2021-03-13 DIAGNOSIS — D519 Vitamin B12 deficiency anemia, unspecified: Secondary | ICD-10-CM | POA: Diagnosis not present

## 2021-03-13 DIAGNOSIS — Z9181 History of falling: Secondary | ICD-10-CM | POA: Diagnosis not present

## 2021-03-13 DIAGNOSIS — R2681 Unsteadiness on feet: Secondary | ICD-10-CM | POA: Diagnosis not present

## 2021-03-13 DIAGNOSIS — K219 Gastro-esophageal reflux disease without esophagitis: Secondary | ICD-10-CM | POA: Diagnosis not present

## 2021-03-13 DIAGNOSIS — N390 Urinary tract infection, site not specified: Secondary | ICD-10-CM | POA: Diagnosis not present

## 2021-03-13 DIAGNOSIS — M6281 Muscle weakness (generalized): Secondary | ICD-10-CM | POA: Diagnosis not present

## 2021-03-13 NOTE — Assessment & Plan Note (Signed)
Vit B12 228 04/03/20, added Vit B 216/22, Hgb 12.7 12/29/20 °

## 2021-03-13 NOTE — Assessment & Plan Note (Signed)
LDL 73 04/03/20, takes Rosuvastatin °

## 2021-03-13 NOTE — Progress Notes (Signed)
Location:   [facility] Room Number: 191-Y Place of Service:  ALF [id number]) Provider:  Previn Jian, NP    Patient Care Team: [name], MD as PCP - General (Internal Medicine) [name], MD as Consulting Physician (Cardiology)  Extended Emergency Contact Information Primary Emergency Contact: Osborne Casco Address: [address]          [place], [place] [address] [place] Phone: [phone] Mobile Phone: [phone] Relation: Niece  Code Status:  FULL CODE Goals of care: Advanced Directive information Advanced Directives 03/13/2021  Does Patient Have a Medical Advance Directive? Yes  Type of Advance Directive [place]  Does patient want to make changes to medical advance directive? No - Patient declined  Copy of [place] in Chart? Yes - validated most recent copy scanned in chart (See row information)  Would patient like information on creating a medical advance directive? -  Pre-existing out of facility DNR order (yellow form or pink MOST form) -     Chief Complaint  Patient presents with   Medical Management of Chronic Issues    Routine Visit.   Health Maintenance    Discuss the need for Mammogram.   Immunizations    Discuss the need for Shingrix vaccine, Tetanus vaccine, and Influenza vaccine.    HPI:  Pt is a 87 y.o. female seen today for medical management of chronic diseases.    CKD, stage 3 Bun/creat 15/1.1 01/15/21             GERD, better indigestion, burping, frequent use of Mylanta, on Omeprazole. Hgb 12.7 12/29/20             Edema BLE-not apparent, uses  Furosemide, DOE, dry cough, not new,  EF 50-55% 06/01/2017.             HTN, takes Amlodipine, Bun/creat 15/1.1 01/15/21             Hyperlipidemia, LDL 73 04/03/20, takes Rosuvastatin             Dementia, functioning well in [facility], 06/26/20 MMSE 23/30, failed clock drawing.              Anemia, Vit B12 228  04/03/20, added Vit B 216/22, Hgb 12.7 12/29/20   Past Medical History:  Diagnosis Date   Arthritis    "right thumb" (01/29/2015)   Chronic shoulder pain    "between my shoulders" (01/29/2015)   Dizziness    Esophageal stricture 2009   Gallstones    GERD (gastroesophageal reflux disease) 2004   Hemorrhoids 2004   Hiatal hernia 2004   History of blood transfusion 1947   "appendix ruptured"   HOH (hard of hearing)    Hypertension    Rhinitis, allergic    Syncopal episodes    Past Surgical History:  Procedure Laterality Date   APPENDECTOMY  1947   "ruptured"   CARDIAC CATHETERIZATION  10/23/1999; 09/2014   EF 60% No significant CAD; patent coronary arteries.   CARPAL TUNNEL RELEASE Right 1990's   CHOLECYSTECTOMY N/A 01/29/2015   Procedure: LAPAROSCOPIC CHOLECYSTECTOMY;  Surgeon: Rolm Bookbinder, MD;  Location: [facility];  Service: General;  Laterality: N/A;   DILATION AND CURETTAGE OF UTERUS  "couple"   ESOPHAGOGASTRODUODENOSCOPY (EGD) WITH ESOPHAGEAL DILATION  2-3 times   HEMORROIDECTOMY     KNEE ARTHROSCOPY Bilateral    right x 2   LAPAROSCOPIC CHOLECYSTECTOMY  01/29/2015   LEFT HEART CATHETERIZATION WITH  CORONARY ANGIOGRAM N/A 09/16/2014   Procedure: LEFT HEART CATHETERIZATION WITH CORONARY ANGIOGRAM;  Surgeon: [name], MD;  Location: [facility] CATH LAB;  Service: Cardiovascular;  Laterality: N/A;   NM MYOVIEW LTD  06/01/2012   EF 71%   VAGINAL HYSTERECTOMY  1970's    Allergies  Allergen Reactions   Contrast Media [Iodinated Diagnostic Agents] Itching and Rash    Pt covered in rash, red.   Codeine Nausea And Vomiting    in large doses: can tolerate Tussionex    Allergies as of 03/13/2021       Reactions   Contrast Media [iodinated Diagnostic Agents] Itching, Rash   Pt covered in rash, red.   Codeine Nausea And Vomiting   in large doses: can tolerate Tussionex        Medication List        Accurate as of March 13, 2021 11:59 PM. If you have any questions, ask  your nurse or doctor.          STOP taking these medications    acetaminophen 325 MG tablet Commonly known as: TYLENOL Stopped by: Dai Apel X Jhamari Markowicz, NP   Dextromethorphan-guaiFENesin 10-100 MG/5ML liquid Stopped by: Omaria Plunk X Jadis Pitter, NP   loratadine 10 MG tablet Commonly known as: CLARITIN Stopped by: Brevon Dewald X Hassan Blackshire, NP       TAKE these medications    amLODipine 2.5 MG tablet Commonly known as: NORVASC Take 1 tablet (2.5 mg total) by mouth daily.   benzonatate 100 MG capsule Commonly known as: TESSALON Take 1 capsule by mouth 3 (three) times daily.   ferrous sulfate 325 (65 FE) MG tablet Take 325 mg by mouth. Monday, Wednesday and Fridays   furosemide 20 MG tablet Commonly known as: LASIX Take 20 mg by mouth daily.   HM Calcium-Vitamin D 600-800 MG-UNIT Tabs Generic drug: Calcium Carb-Cholecalciferol Take 1 tablet by mouth 2 (two) times daily.   omeprazole 20 MG capsule Commonly known as: PRILOSEC Take 20 mg by mouth daily.   rosuvastatin 20 MG tablet Commonly known as: CRESTOR Take 1 tablet (20 mg total) by mouth daily.   vitamin B-12 1000 MCG tablet Commonly known as: CYANOCOBALAMIN Take 1,000 mcg by mouth daily.   Vitamin D3 50 MCG (2000 UT) Tabs Take 1 capsule by mouth daily.        Review of Systems  Constitutional:  Negative for activity change, fever and unexpected weight change.       Low grade temperature.   HENT:  Positive for hearing loss. Negative for congestion and trouble swallowing.   Respiratory:  Positive for cough. Negative for shortness of breath.   Cardiovascular:  Negative for leg swelling.  Gastrointestinal:  Negative for abdominal pain and constipation.  Genitourinary:  Positive for frequency. Negative for dysuria and urgency.       Baseline urinary frequency  Musculoskeletal:  Positive for gait problem.  Skin:  Negative for color change.  Neurological:  Negative for speech difficulty, weakness and light-headedness.       Memory lapses.    Psychiatric/Behavioral:  Negative for confusion and sleep disturbance. The patient is not nervous/anxious.    Immunization History  Administered Date(s) Administered   Influenza Split 03/16/2011, 03/01/2012   Influenza Whole 03/28/2007, 04/01/2009, 02/18/2010   Influenza, High Dose Seasonal PF 03/14/2013   Influenza,inj,Quad PF,6+ Mos 03/20/2014, 03/11/2015   Influenza-Unspecified 08/06/2018, 03/20/2020   Moderna SARS-COV2 Booster Vaccination 11/04/2020   Moderna Sars-Covid-2 Vaccination 06/11/2019, 07/09/2019, 02/24/2021   Pneumococcal Conjugate-13 02/25/2015  Pneumococcal Polysaccharide-23 03/28/2007   Td 12/20/2005   Pertinent  Health Maintenance Due  Topic Date Due   MAMMOGRAM  09/15/2013   INFLUENZA VACCINE  01/05/2021   DEXA SCAN  Completed   Fall Risk  04/27/2019 04/21/2018 02/15/2017 12/28/2016 08/25/2015  Falls in the past year? 0 0 No No No  Comment [facility] Telephone Survey: data to providers prior to load [employer] Telephone Survey: data to providers prior to load - [facility] Telephone Survey: data to providers prior to load -   Functional Status Survey:    Vitals:   03/13/21 1111  BP: 140/68  Pulse: 68  Resp: 18  Temp: 98 F (36.7 C)  SpO2: 97%  Weight: 198 lb 9.6 oz (90.1 kg)  Height: 5' (1.524 m)   Body mass index is 38.79 kg/m. Physical Exam Constitutional:      Comments: Appears tired.   HENT:     Head: Normocephalic and atraumatic.     Mouth/Throat:     Mouth: Mucous membranes are dry.  Eyes:     Extraocular Movements: Extraocular movements intact.     Conjunctiva/sclera: Conjunctivae normal.     Pupils: Pupils are equal, round, and reactive to light.  Cardiovascular:     Rate and Rhythm: Normal rate and regular rhythm.     Heart sounds: No murmur heard. Pulmonary:     Effort: Pulmonary effort is normal.     Breath sounds: No wheezing or rales.  Abdominal:     General: Bowel sounds are normal.     Palpations: Abdomen is soft.     Tenderness: There is  no abdominal tenderness.  Musculoskeletal:     Cervical back: Normal range of motion and neck supple.     Right lower leg: No edema.     Left lower leg: No edema.     Comments: BLE chronic venous insufficiency skin changes.   Skin:    General: Skin is warm and dry.  Neurological:     General: No focal deficit present.     Mental Status: She is alert. Mental status is at baseline.     Gait: Gait abnormal.     Comments: Oriented to person, her room on unit.   Psychiatric:        Mood and Affect: Mood normal.        Behavior: Behavior normal.    Labs reviewed: Recent Labs    04/03/20 1211 05/09/20 0000 12/29/20 0000 01/06/21 0000 01/15/21 0000  NA 139   < > 137 139 140  K 3.4*   < > 3.4 3.6 4.4  CL 98   < > 100 98* 103  CO2 27   < > 29* 33* 29*  GLUCOSE 108*  --   --   --   --   BUN 22   < > 17 26* 15  CREATININE 1.16*   < > 1.3* 1.2* 1.1  CALCIUM 9.9   < > 8.5* 8.6* 8.8   < > = values in this interval not displayed.   Recent Labs    04/03/20 1211  AST 17  ALT 7  BILITOT 0.9  PROT 7.0   Recent Labs    04/03/20 1211 04/03/20 1211 07/05/20 0000 08/19/20 0000 08/27/20 0000 09/25/20 0000 12/29/20 0000  WBC 10.9*   < > 11.2 8.6 6.4 7.3 10.3  NEUTROABS 6,507  --  7,134.00 4,395.00  --   --  6,149.00  HGB 12.4  --  10.3* 9.9* 9.5* 11.3* 12.7  HCT 39.3  --  32* 32* 31* 37 38  MCV 85.6  --   --   --   --   --   --   PLT 350  --  264 17* 244 275 212   < > = values in this interval not displayed.   Lab Results  Component Value Date   TSH 2.55 04/03/2020   Lab Results  Component Value Date   HGBA1C 5.8 07/11/2017   Lab Results  Component Value Date   CHOL 147 04/03/2020   HDL 51 04/03/2020   LDLCALC 73 04/03/2020   LDLDIRECT 161.2 07/18/2013   TRIG 143 04/03/2020   CHOLHDL 2.9 04/03/2020    Significant Diagnostic Results in last 30 days:  No results found.  Assessment/Plan Essential hypertension Blood pressure is controlled, takes Amlodipine,    Dyslipidemia LDL 73 04/03/20, takes Rosuvastatin  Senile dementia ([place]) functioning well in [facility], 06/26/20 MMSE 23/30, failed clock drawing.  Vitamin B12 deficiency anemia Vit B12 228 04/03/20, added Vit B 216/22, Hgb 12.7 12/29/20  Edema of both lower extremities due to peripheral venous insufficiency not apparent, uses  Furosemide, DOE, dry cough, not new,  EF 50-55% 06/01/2017.  GERD better indigestion, burping, frequent use of Mylanta, on Omeprazole. Hgb 12.7 12/29/20  CKD (chronic kidney disease) stage 3, GFR 30-59 ml/min (HCC)  stage 3 Bun/creat 15/1.1 01/15/21    Family/ staff Communication: plan of care reviewed with the patient and charge nurse.   Labs/tests ordered:  none  Time spend 40 minutes.

## 2021-03-13 NOTE — Assessment & Plan Note (Signed)
-

## 2021-03-13 NOTE — Assessment & Plan Note (Signed)
Blood pressure is controlled, takes Amlodipine,

## 2021-03-13 NOTE — Assessment & Plan Note (Signed)
functioning well in [place], 06/26/20 MMSE 23/30, failed clock drawing.

## 2021-03-13 NOTE — Assessment & Plan Note (Signed)
stage 3 Bun/creat 15/1.1 01/15/21

## 2021-03-13 NOTE — Assessment & Plan Note (Signed)
better indigestion, burping, frequent use of Mylanta, on Omeprazole. Hgb 12.7 12/29/20 °

## 2021-03-16 ENCOUNTER — Encounter: Payer: Self-pay | Admitting: Nurse Practitioner

## 2021-03-16 DIAGNOSIS — M6281 Muscle weakness (generalized): Secondary | ICD-10-CM | POA: Diagnosis not present

## 2021-03-16 DIAGNOSIS — N390 Urinary tract infection, site not specified: Secondary | ICD-10-CM | POA: Diagnosis not present

## 2021-03-16 DIAGNOSIS — M5459 Other low back pain: Secondary | ICD-10-CM | POA: Diagnosis not present

## 2021-03-16 DIAGNOSIS — Z9181 History of falling: Secondary | ICD-10-CM | POA: Diagnosis not present

## 2021-03-16 DIAGNOSIS — R2681 Unsteadiness on feet: Secondary | ICD-10-CM | POA: Diagnosis not present

## 2021-03-18 DIAGNOSIS — Z9181 History of falling: Secondary | ICD-10-CM | POA: Diagnosis not present

## 2021-03-18 DIAGNOSIS — N390 Urinary tract infection, site not specified: Secondary | ICD-10-CM | POA: Diagnosis not present

## 2021-03-18 DIAGNOSIS — M5459 Other low back pain: Secondary | ICD-10-CM | POA: Diagnosis not present

## 2021-03-18 DIAGNOSIS — M6281 Muscle weakness (generalized): Secondary | ICD-10-CM | POA: Diagnosis not present

## 2021-03-18 DIAGNOSIS — R2681 Unsteadiness on feet: Secondary | ICD-10-CM | POA: Diagnosis not present

## 2021-03-20 DIAGNOSIS — M6281 Muscle weakness (generalized): Secondary | ICD-10-CM | POA: Diagnosis not present

## 2021-03-20 DIAGNOSIS — Z9181 History of falling: Secondary | ICD-10-CM | POA: Diagnosis not present

## 2021-03-20 DIAGNOSIS — N390 Urinary tract infection, site not specified: Secondary | ICD-10-CM | POA: Diagnosis not present

## 2021-03-20 DIAGNOSIS — R2681 Unsteadiness on feet: Secondary | ICD-10-CM | POA: Diagnosis not present

## 2021-03-20 DIAGNOSIS — M5459 Other low back pain: Secondary | ICD-10-CM | POA: Diagnosis not present

## 2021-03-23 DIAGNOSIS — R2681 Unsteadiness on feet: Secondary | ICD-10-CM | POA: Diagnosis not present

## 2021-03-23 DIAGNOSIS — M5459 Other low back pain: Secondary | ICD-10-CM | POA: Diagnosis not present

## 2021-03-23 DIAGNOSIS — Z9181 History of falling: Secondary | ICD-10-CM | POA: Diagnosis not present

## 2021-03-23 DIAGNOSIS — M6281 Muscle weakness (generalized): Secondary | ICD-10-CM | POA: Diagnosis not present

## 2021-03-23 DIAGNOSIS — N390 Urinary tract infection, site not specified: Secondary | ICD-10-CM | POA: Diagnosis not present

## 2021-03-25 DIAGNOSIS — Z9181 History of falling: Secondary | ICD-10-CM | POA: Diagnosis not present

## 2021-03-25 DIAGNOSIS — M6281 Muscle weakness (generalized): Secondary | ICD-10-CM | POA: Diagnosis not present

## 2021-03-25 DIAGNOSIS — M5459 Other low back pain: Secondary | ICD-10-CM | POA: Diagnosis not present

## 2021-03-25 DIAGNOSIS — N390 Urinary tract infection, site not specified: Secondary | ICD-10-CM | POA: Diagnosis not present

## 2021-03-25 DIAGNOSIS — R2681 Unsteadiness on feet: Secondary | ICD-10-CM | POA: Diagnosis not present

## 2021-03-26 ENCOUNTER — Telehealth: Payer: Self-pay | Admitting: Cardiovascular Disease

## 2021-03-26 NOTE — Telephone Encounter (Signed)
Spoke with Manuela Schwartz at [facility]. She is reviewing list of residents who need flu vaccines. Patient told her she had her shot at our office recently. Advised patient has not see Dr. [name] or another cardiology provider since 2020, this vaccine not done in our practice

## 2021-03-26 NOTE — Telephone Encounter (Signed)
New Message:     Manuela Schwartz from [facility] called. She wants to know if patient had her flu shot when she was here last?

## 2021-03-27 DIAGNOSIS — R2681 Unsteadiness on feet: Secondary | ICD-10-CM | POA: Diagnosis not present

## 2021-03-27 DIAGNOSIS — Z9181 History of falling: Secondary | ICD-10-CM | POA: Diagnosis not present

## 2021-03-27 DIAGNOSIS — N390 Urinary tract infection, site not specified: Secondary | ICD-10-CM | POA: Diagnosis not present

## 2021-03-27 DIAGNOSIS — M5459 Other low back pain: Secondary | ICD-10-CM | POA: Diagnosis not present

## 2021-03-27 DIAGNOSIS — M6281 Muscle weakness (generalized): Secondary | ICD-10-CM | POA: Diagnosis not present

## 2021-03-30 DIAGNOSIS — N390 Urinary tract infection, site not specified: Secondary | ICD-10-CM | POA: Diagnosis not present

## 2021-03-30 DIAGNOSIS — R2681 Unsteadiness on feet: Secondary | ICD-10-CM | POA: Diagnosis not present

## 2021-03-30 DIAGNOSIS — M5459 Other low back pain: Secondary | ICD-10-CM | POA: Diagnosis not present

## 2021-03-30 DIAGNOSIS — Z9181 History of falling: Secondary | ICD-10-CM | POA: Diagnosis not present

## 2021-03-30 DIAGNOSIS — M6281 Muscle weakness (generalized): Secondary | ICD-10-CM | POA: Diagnosis not present

## 2021-04-01 DIAGNOSIS — Z9181 History of falling: Secondary | ICD-10-CM | POA: Diagnosis not present

## 2021-04-01 DIAGNOSIS — M6281 Muscle weakness (generalized): Secondary | ICD-10-CM | POA: Diagnosis not present

## 2021-04-01 DIAGNOSIS — M5459 Other low back pain: Secondary | ICD-10-CM | POA: Diagnosis not present

## 2021-04-01 DIAGNOSIS — R2681 Unsteadiness on feet: Secondary | ICD-10-CM | POA: Diagnosis not present

## 2021-04-01 DIAGNOSIS — N390 Urinary tract infection, site not specified: Secondary | ICD-10-CM | POA: Diagnosis not present

## 2021-04-03 DIAGNOSIS — Z9181 History of falling: Secondary | ICD-10-CM | POA: Diagnosis not present

## 2021-04-03 DIAGNOSIS — M6281 Muscle weakness (generalized): Secondary | ICD-10-CM | POA: Diagnosis not present

## 2021-04-03 DIAGNOSIS — N390 Urinary tract infection, site not specified: Secondary | ICD-10-CM | POA: Diagnosis not present

## 2021-04-03 DIAGNOSIS — R2681 Unsteadiness on feet: Secondary | ICD-10-CM | POA: Diagnosis not present

## 2021-04-03 DIAGNOSIS — M5459 Other low back pain: Secondary | ICD-10-CM | POA: Diagnosis not present

## 2021-04-06 DIAGNOSIS — M6281 Muscle weakness (generalized): Secondary | ICD-10-CM | POA: Diagnosis not present

## 2021-04-06 DIAGNOSIS — R2681 Unsteadiness on feet: Secondary | ICD-10-CM | POA: Diagnosis not present

## 2021-04-06 DIAGNOSIS — Z9181 History of falling: Secondary | ICD-10-CM | POA: Diagnosis not present

## 2021-04-06 DIAGNOSIS — N390 Urinary tract infection, site not specified: Secondary | ICD-10-CM | POA: Diagnosis not present

## 2021-04-06 DIAGNOSIS — M5459 Other low back pain: Secondary | ICD-10-CM | POA: Diagnosis not present

## 2021-04-08 DIAGNOSIS — R2681 Unsteadiness on feet: Secondary | ICD-10-CM | POA: Diagnosis not present

## 2021-04-08 DIAGNOSIS — Z9181 History of falling: Secondary | ICD-10-CM | POA: Diagnosis not present

## 2021-04-08 DIAGNOSIS — M5459 Other low back pain: Secondary | ICD-10-CM | POA: Diagnosis not present

## 2021-04-08 DIAGNOSIS — M6281 Muscle weakness (generalized): Secondary | ICD-10-CM | POA: Diagnosis not present

## 2021-04-08 DIAGNOSIS — N390 Urinary tract infection, site not specified: Secondary | ICD-10-CM | POA: Diagnosis not present

## 2021-04-09 DIAGNOSIS — M5459 Other low back pain: Secondary | ICD-10-CM | POA: Diagnosis not present

## 2021-04-09 DIAGNOSIS — R2681 Unsteadiness on feet: Secondary | ICD-10-CM | POA: Diagnosis not present

## 2021-04-09 DIAGNOSIS — M6281 Muscle weakness (generalized): Secondary | ICD-10-CM | POA: Diagnosis not present

## 2021-04-09 DIAGNOSIS — N390 Urinary tract infection, site not specified: Secondary | ICD-10-CM | POA: Diagnosis not present

## 2021-04-09 DIAGNOSIS — Z9181 History of falling: Secondary | ICD-10-CM | POA: Diagnosis not present

## 2021-04-14 DIAGNOSIS — M6281 Muscle weakness (generalized): Secondary | ICD-10-CM | POA: Diagnosis not present

## 2021-04-14 DIAGNOSIS — N390 Urinary tract infection, site not specified: Secondary | ICD-10-CM | POA: Diagnosis not present

## 2021-04-14 DIAGNOSIS — M5459 Other low back pain: Secondary | ICD-10-CM | POA: Diagnosis not present

## 2021-04-14 DIAGNOSIS — Z9181 History of falling: Secondary | ICD-10-CM | POA: Diagnosis not present

## 2021-04-14 DIAGNOSIS — R2681 Unsteadiness on feet: Secondary | ICD-10-CM | POA: Diagnosis not present

## 2021-04-15 DIAGNOSIS — N390 Urinary tract infection, site not specified: Secondary | ICD-10-CM | POA: Diagnosis not present

## 2021-04-15 DIAGNOSIS — M6281 Muscle weakness (generalized): Secondary | ICD-10-CM | POA: Diagnosis not present

## 2021-04-15 DIAGNOSIS — R2681 Unsteadiness on feet: Secondary | ICD-10-CM | POA: Diagnosis not present

## 2021-04-15 DIAGNOSIS — M5459 Other low back pain: Secondary | ICD-10-CM | POA: Diagnosis not present

## 2021-04-15 DIAGNOSIS — Z9181 History of falling: Secondary | ICD-10-CM | POA: Diagnosis not present

## 2021-04-16 DIAGNOSIS — M5459 Other low back pain: Secondary | ICD-10-CM | POA: Diagnosis not present

## 2021-04-16 DIAGNOSIS — M6281 Muscle weakness (generalized): Secondary | ICD-10-CM | POA: Diagnosis not present

## 2021-04-16 DIAGNOSIS — N390 Urinary tract infection, site not specified: Secondary | ICD-10-CM | POA: Diagnosis not present

## 2021-04-16 DIAGNOSIS — Z9181 History of falling: Secondary | ICD-10-CM | POA: Diagnosis not present

## 2021-04-16 DIAGNOSIS — R2681 Unsteadiness on feet: Secondary | ICD-10-CM | POA: Diagnosis not present

## 2021-04-20 DIAGNOSIS — M5459 Other low back pain: Secondary | ICD-10-CM | POA: Diagnosis not present

## 2021-04-20 DIAGNOSIS — R2681 Unsteadiness on feet: Secondary | ICD-10-CM | POA: Diagnosis not present

## 2021-04-20 DIAGNOSIS — M6281 Muscle weakness (generalized): Secondary | ICD-10-CM | POA: Diagnosis not present

## 2021-04-20 DIAGNOSIS — N390 Urinary tract infection, site not specified: Secondary | ICD-10-CM | POA: Diagnosis not present

## 2021-04-20 DIAGNOSIS — Z9181 History of falling: Secondary | ICD-10-CM | POA: Diagnosis not present

## 2021-04-22 DIAGNOSIS — R2681 Unsteadiness on feet: Secondary | ICD-10-CM | POA: Diagnosis not present

## 2021-04-22 DIAGNOSIS — M5459 Other low back pain: Secondary | ICD-10-CM | POA: Diagnosis not present

## 2021-04-22 DIAGNOSIS — Z9181 History of falling: Secondary | ICD-10-CM | POA: Diagnosis not present

## 2021-04-22 DIAGNOSIS — M6281 Muscle weakness (generalized): Secondary | ICD-10-CM | POA: Diagnosis not present

## 2021-04-22 DIAGNOSIS — N390 Urinary tract infection, site not specified: Secondary | ICD-10-CM | POA: Diagnosis not present

## 2021-04-23 DIAGNOSIS — N390 Urinary tract infection, site not specified: Secondary | ICD-10-CM | POA: Diagnosis not present

## 2021-04-23 DIAGNOSIS — Z9181 History of falling: Secondary | ICD-10-CM | POA: Diagnosis not present

## 2021-04-23 DIAGNOSIS — M5459 Other low back pain: Secondary | ICD-10-CM | POA: Diagnosis not present

## 2021-04-23 DIAGNOSIS — R2681 Unsteadiness on feet: Secondary | ICD-10-CM | POA: Diagnosis not present

## 2021-04-23 DIAGNOSIS — M6281 Muscle weakness (generalized): Secondary | ICD-10-CM | POA: Diagnosis not present

## 2021-04-27 DIAGNOSIS — M6281 Muscle weakness (generalized): Secondary | ICD-10-CM | POA: Diagnosis not present

## 2021-04-27 DIAGNOSIS — N390 Urinary tract infection, site not specified: Secondary | ICD-10-CM | POA: Diagnosis not present

## 2021-04-27 DIAGNOSIS — R2681 Unsteadiness on feet: Secondary | ICD-10-CM | POA: Diagnosis not present

## 2021-04-27 DIAGNOSIS — Z9181 History of falling: Secondary | ICD-10-CM | POA: Diagnosis not present

## 2021-04-27 DIAGNOSIS — M5459 Other low back pain: Secondary | ICD-10-CM | POA: Diagnosis not present

## 2021-04-29 DIAGNOSIS — Z9181 History of falling: Secondary | ICD-10-CM | POA: Diagnosis not present

## 2021-04-29 DIAGNOSIS — N390 Urinary tract infection, site not specified: Secondary | ICD-10-CM | POA: Diagnosis not present

## 2021-04-29 DIAGNOSIS — R2681 Unsteadiness on feet: Secondary | ICD-10-CM | POA: Diagnosis not present

## 2021-04-29 DIAGNOSIS — M6281 Muscle weakness (generalized): Secondary | ICD-10-CM | POA: Diagnosis not present

## 2021-04-29 DIAGNOSIS — M5459 Other low back pain: Secondary | ICD-10-CM | POA: Diagnosis not present

## 2021-05-04 DIAGNOSIS — M5459 Other low back pain: Secondary | ICD-10-CM | POA: Diagnosis not present

## 2021-05-04 DIAGNOSIS — R2681 Unsteadiness on feet: Secondary | ICD-10-CM | POA: Diagnosis not present

## 2021-05-04 DIAGNOSIS — Z9181 History of falling: Secondary | ICD-10-CM | POA: Diagnosis not present

## 2021-05-04 DIAGNOSIS — N390 Urinary tract infection, site not specified: Secondary | ICD-10-CM | POA: Diagnosis not present

## 2021-05-04 DIAGNOSIS — M6281 Muscle weakness (generalized): Secondary | ICD-10-CM | POA: Diagnosis not present

## 2021-05-06 ENCOUNTER — Encounter: Payer: Self-pay | Admitting: Nurse Practitioner

## 2021-05-06 ENCOUNTER — Non-Acute Institutional Stay (INDEPENDENT_AMBULATORY_CARE_PROVIDER_SITE_OTHER): Payer: Medicare Other | Admitting: Nurse Practitioner

## 2021-05-06 DIAGNOSIS — Z0000 Encounter for general adult medical examination without abnormal findings: Secondary | ICD-10-CM

## 2021-05-07 ENCOUNTER — Encounter: Payer: Self-pay | Admitting: Nurse Practitioner

## 2021-05-07 NOTE — Progress Notes (Signed)
Subjective:   Deanna Shields is a 87 y.o. female who presents for Medicare Annual (Subsequent) preventive examination in [place]. Cardiac Risk Factors include: advanced age (>43men, >54 women);hypertension;dyslipidemia;obesity (BMI >30kg/m2)     Objective:    Today's Vitals   05/06/21 1158  BP: 134/66  Pulse: 68  Resp: 20  Temp: 98.2 F (36.8 C)  SpO2: 96%  Weight: 198 lb 9.6 oz (90.1 kg)  Height: 5' (1.524 m)   Body mass index is 38.79 kg/m.  Advanced Directives 05/06/2021 03/13/2021 01/12/2021 12/29/2020 12/23/2020 04/22/2020 11/21/2016  Does Patient Have a Medical Advance Directive? Yes Yes Yes Yes Yes Yes No  Type of Arts administrator Power of [place] of [place] of [place];Living will -  Does patient want to make changes to medical advance directive? No - Patient declined No - Patient declined No - Patient declined No - Patient declined No - Patient declined No - Patient declined -  Copy of [place] in Chart? Yes - validated most recent copy scanned in chart (See row information) Yes - validated most recent copy scanned in chart (See row information) Yes - validated most recent copy scanned in chart (See row information) Yes - validated most recent copy scanned in chart (See row information) Yes - validated most recent copy scanned in chart (See row information) Yes - validated most recent copy scanned in chart (See row information) -  Would patient like information on creating a medical advance directive? - - - - - - No - Patient declined  Pre-existing out of facility DNR order (yellow form or pink MOST form) - - - - - - -    Current Medications (verified) Outpatient Encounter Medications as of 05/06/2021  Medication Sig   amLODipine (NORVASC) 2.5 MG tablet Take 1 tablet (2.5 mg total) by mouth  daily.   benzonatate (TESSALON) 100 MG capsule Take 1 capsule by mouth 3 (three) times daily.   Calcium Carb-Cholecalciferol (HM CALCIUM-VITAMIN D) 600-800 MG-UNIT TABS Take 1 tablet by mouth 2 (two) times daily.   Cholecalciferol (VITAMIN D3) 50 MCG (2000 UT) TABS Take 1 capsule by mouth daily.   ferrous sulfate 325 (65 FE) MG tablet Take 325 mg by mouth. Monday, Wednesday and Fridays   furosemide (LASIX) 20 MG tablet Take 20 mg by mouth daily.   omeprazole (PRILOSEC) 20 MG capsule Take 20 mg by mouth daily.   rosuvastatin (CRESTOR) 20 MG tablet Take 1 tablet (20 mg total) by mouth daily.   vitamin B-12 (CYANOCOBALAMIN) 1000 MCG tablet Take 1,000 mcg by mouth daily.   No facility-administered encounter medications on file as of 05/06/2021.    Allergies (verified) Contrast media [iodinated diagnostic agents] and Codeine   History: Past Medical History:  Diagnosis Date   Arthritis    "right thumb" (01/29/2015)   Chronic shoulder pain    "between my shoulders" (01/29/2015)   Dizziness    Esophageal stricture 2009   Gallstones    GERD (gastroesophageal reflux disease) 2004   Hemorrhoids 2004   Hiatal hernia 2004   History of blood transfusion 1947   "appendix ruptured"   HOH (hard of hearing)    Hypertension    Rhinitis, allergic    Syncopal episodes    Past Surgical History:  Procedure Laterality Date   APPENDECTOMY  1947   "ruptured"   CARDIAC CATHETERIZATION  10/23/1999; 09/2014  EF 60% No significant CAD; patent coronary arteries.   CARPAL TUNNEL RELEASE Right 1990's   CHOLECYSTECTOMY N/A 01/29/2015   Procedure: LAPAROSCOPIC CHOLECYSTECTOMY;  Surgeon: Rolm Bookbinder, MD;  Location: [facility];  Service: General;  Laterality: N/A;   DILATION AND CURETTAGE OF UTERUS  "couple"   ESOPHAGOGASTRODUODENOSCOPY (EGD) WITH ESOPHAGEAL DILATION  2-3 times   HEMORROIDECTOMY     KNEE ARTHROSCOPY Bilateral    right x 2   LAPAROSCOPIC CHOLECYSTECTOMY  01/29/2015   LEFT HEART  CATHETERIZATION WITH CORONARY ANGIOGRAM N/A 09/16/2014   Procedure: LEFT HEART CATHETERIZATION WITH CORONARY ANGIOGRAM;  Surgeon: [name], MD;  Location: [facility] CATH LAB;  Service: Cardiovascular;  Laterality: N/A;   NM MYOVIEW LTD  06/01/2012   EF 71%   VAGINAL HYSTERECTOMY  63's   Family History  Problem Relation Age of Onset   Stroke Father 55       light stroke   Heart disease Sister    COPD Sister    Heart disease Brother    Coronary artery disease Neg Hx    Diabetes Neg Hx    Cancer Neg Hx        breast, colon, prostate   Social History   Socioeconomic History   Marital status: Widowed    Spouse name: Not on file   Number of children: 0   Years of education: Not on file   Highest education level: Not on file  Occupational History   Occupation: retired    Fish farm manager: RETIRED    Comment: school cafeteria mgr  Tobacco Use   Smoking status: Never   Smokeless tobacco: Never  Vaping Use   Vaping Use: Never used  Substance and Sexual Activity   Alcohol use: No    Alcohol/week: 0.0 standard drinks   Drug use: No   Sexual activity: Never  Other Topics Concern   Not on file  Social History Narrative   Patient does not consume alcohol or use tobacco products.   She does drink/eat things with caffeine in it.   Marital status: widow (married in [place])   Patient lives in an assisted living 1 story apartment home   Highest level of education completed was high school.   Past profession: home maker   Exercise: walk 3 x week   Patient has a POA and living   Social Determinants of Radio broadcast assistant Strain: Not on file  Food Insecurity: Not on file  Transportation Needs: Not on file  Physical Activity: Not on file  Stress: Not on file  Social Connections: Not on file    Tobacco Counseling Counseling given: Not Answered   Clinical Intake:  Pre-visit preparation completed: Yes  Pain : No/denies pain     BMI - recorded: 38.79 Nutritional Status:  BMI > 30  Obese Nutritional Risks: None Diabetes: No  How often do you need to have someone help you when you read instructions, pamphlets, or other written materials from your doctor or pharmacy?: 2 - Rarely What is the last grade level you completed in school?: 2 years of college  Diabetic?no  Interpreter Needed?: No  Information entered by :: Twylia Oka Bretta Bang NP   Activities of Daily Living In your present state of health, do you have any difficulty performing the following activities: 05/07/2021  Hearing? N  Vision? N  Difficulty concentrating or making decisions? N  Walking or climbing stairs? Y  Dressing or bathing? N  Doing errands, shopping? Y  Preparing Food and eating ?  N  Using the Toilet? N  In the past six months, have you accidently leaked urine? N  Do you have problems with loss of bowel control? N  Managing your Medications? Y  Managing your Finances? Y  Housekeeping or managing your Housekeeping? Y  Some recent data might be hidden    Patient Care Team: [name], MD as PCP - General (Internal Medicine) [name], MD as Consulting Physician (Cardiology)  Indicate any recent Medical Services you may have received from other than [facility] providers in the past year (date may be approximate).     Assessment:   This is a routine wellness examination for Deanna.  Hearing/Vision screen No results found.  Dietary issues and exercise activities discussed: Current Exercise Habits: The patient does not participate in regular exercise at present (ambulates with walker daily), Exercise limited by: orthopedic condition(s);cardiac condition(s)   Goals Addressed             This Visit's Progress    Maintain Mobility and Function       Evidence-based guidance:  Emphasize the importance of physical activity and aerobic exercise as included in treatment plan; assess barriers to adherence; consider patient's abilities and preferences.  Encourage gradual  increase in activity or exercise instead of stopping if pain occurs.  Reinforce individual therapy exercise prescription, such as strengthening, stabilization and stretching programs.  Promote optimal body mechanics to stabilize the spine with lifting and functional activity.  Encourage activity and mobility modifications to facilitate optimal function, such as using a log roll for bed mobility or dressing from a seated position.  Reinforce individual adaptive equipment recommendations to limit excessive spinal movements, such as a Systems analyst.  Assess adequacy of sleep; encourage use of sleep hygiene techniques, such as bedtime routine; use of white noise; dark, cool bedroom; avoiding daytime naps, heavy meals or exercise before bedtime.  Promote positions and modification to optimize sleep and sexual activity; consider pillows or positioning devices to assist in maintaining neutral spine.  Explore options for applying ergonomic principles at work and home, such as frequent position changes, using ergonomically designed equipment and working at optimal height.  Promote modifications to increase comfort with driving such as lumbar support, optimizing seat and steering wheel position, using cruise control and taking frequent rest stops to stretch and walk.   Notes:        Depression Screen PHQ 2/9 Scores 07/24/2018 05/10/2016 08/25/2015 08/25/2015 08/19/2014 07/18/2013 09/15/2012  PHQ - 2 Score 0 0 0 1 0 0 0  Exception Documentation - - Patient refusal - - - -    Fall Risk Fall Risk  04/27/2019 04/21/2018 02/15/2017 12/28/2016 08/25/2015  Falls in the past year? 0 0 No No No  Comment [employer] Telephone Survey: data to providers prior to load [employer] Telephone Survey: data to providers prior to load - [employer] Telephone Survey: data to providers prior to load -    FALL RISK PREVENTION PERTAINING TO THE HOME:  Any stairs in or around the home? Yes  If so, are there any without handrails? No  Home free  of loose throw rugs in walkways, pet beds, electrical cords, etc? Yes  Adequate lighting in your home to reduce risk of falls? Yes   ASSISTIVE DEVICES UTILIZED TO PREVENT FALLS:  Life alert? No  Use of a cane, walker or w/c? Yes  Grab bars in the bathroom? Yes  Shower chair or bench in shower? Yes  Elevated toilet seat or a handicapped toilet?  Yes   TIMED UP AND GO:  Was the test performed? No .  Length of time to ambulate 10 feet: 12 sec.   Gait slow and steady with assistive device  Cognitive Function:        Immunizations Immunization History  Administered Date(s) Administered   Influenza Split 03/16/2011, 03/01/2012   Influenza Whole 03/28/2007, 04/01/2009, 02/18/2010   Influenza, High Dose Seasonal PF 03/14/2013   Influenza,inj,Quad PF,6+ Mos 03/20/2014, 03/11/2015   Influenza-Unspecified 08/06/2018, 03/20/2020   Moderna SARS-COV2 Booster Vaccination 11/04/2020   Moderna Sars-Covid-2 Vaccination 06/11/2019, 07/09/2019, 02/24/2021   Pneumococcal Conjugate-13 02/25/2015   Pneumococcal Polysaccharide-23 03/28/2007   Td 12/20/2005    TDAP status: Due, Education has been provided regarding the importance of this vaccine. Advised may receive this vaccine at local pharmacy or Health Dept. Aware to provide a copy of the vaccination record if obtained from local pharmacy or Health Dept. Verbalized acceptance and understanding.  Flu vaccine: the patient refused.   Pneumococcal vaccine status: Up to date  Covid-19 vaccine status: Completed vaccines  Qualifies for Shingles Vaccine? Yes   Zostavax completed No, declined.  Shingrix Completed?: No.    Education has been provided regarding the importance of this vaccine. Patient has been advised to call insurance company to determine out of pocket expense if they have not yet received this vaccine. Advised may also receive vaccine at local pharmacy or Health Dept. Verbalized acceptance and understanding.  Screening  Tests Health Maintenance  Topic Date Due   Zoster Vaccines- Shingrix (1 of 2) Never done   MAMMOGRAM  09/15/2013   TETANUS/TDAP  12/21/2015   INFLUENZA VACCINE  01/05/2021   COVID-19 Vaccine (4 - Booster for Moderna series) 04/21/2021   Pneumonia Vaccine 42+ Years old  Completed   DEXA SCAN  Completed   HPV VACCINES  Aged Out    Health Maintenance  Health Maintenance Due  Topic Date Due   Zoster Vaccines- Shingrix (1 of 2) Never done   MAMMOGRAM  09/15/2013   TETANUS/TDAP  12/21/2015   INFLUENZA VACCINE  01/05/2021   COVID-19 Vaccine (4 - Booster for Moderna series) 04/21/2021    Colorectal cancer screening: No longer required.   Mammogram: No longer required  Bone Density test: the patient declined   Lung Cancer Screening: (Low Dose CT Chest recommended if Age 30-80 years, 30 pack-year currently smoking OR have quit w/in 15years.) does not qualify.   Lung Cancer Screening Referral: none  Additional Screening:  Hepatitis C Screening: does not qualify  Vision Screening: Recommended annual ophthalmology exams for early detection of glaucoma and other disorders of the eye. Is the patient up to date with their annual eye exam?  No  Who is the provider or what is the name of the office in which the patient attends annual eye exams? None, refer to Ophthalmology if desires.  If pt is not established with a provider, would they like to be referred to a provider to establish care? No .   Dental Screening: Recommended annual dental exams for proper oral hygiene  Community Resource Referral / Chronic Care Management: CRR required this visit?  No   CCM required this visit?  No      Plan:     I have personally reviewed and noted the following in the patient's chart:   Medical and social history Use of alcohol, tobacco or illicit drugs  Current medications and supplements including opioid prescriptions.  Functional ability and status Nutritional status Physical  activity Advanced directives  List of other physicians Hospitalizations, surgeries, and ER visits in previous 12 months Vitals Screenings to include cognitive, depression, and falls Referrals and appointments  In addition, I have reviewed and discussed with patient certain preventive protocols, quality metrics, and best practice recommendations. A written personalized care plan for preventive services as well as general preventive health recommendations were provided to patient.     Kivon Aprea X Nakya Weyand, NP   05/07/2021

## 2021-05-29 ENCOUNTER — Non-Acute Institutional Stay: Payer: Medicare Other | Admitting: Nurse Practitioner

## 2021-05-29 ENCOUNTER — Encounter: Payer: Self-pay | Admitting: Nurse Practitioner

## 2021-05-29 DIAGNOSIS — I10 Essential (primary) hypertension: Secondary | ICD-10-CM | POA: Diagnosis not present

## 2021-05-29 DIAGNOSIS — F0390 Unspecified dementia without behavioral disturbance: Secondary | ICD-10-CM

## 2021-05-29 DIAGNOSIS — K219 Gastro-esophageal reflux disease without esophagitis: Secondary | ICD-10-CM | POA: Diagnosis not present

## 2021-05-29 DIAGNOSIS — E785 Hyperlipidemia, unspecified: Secondary | ICD-10-CM | POA: Diagnosis not present

## 2021-05-29 DIAGNOSIS — R053 Chronic cough: Secondary | ICD-10-CM

## 2021-05-29 DIAGNOSIS — D519 Vitamin B12 deficiency anemia, unspecified: Secondary | ICD-10-CM | POA: Diagnosis not present

## 2021-05-29 DIAGNOSIS — U071 COVID-19: Secondary | ICD-10-CM

## 2021-05-29 DIAGNOSIS — I872 Venous insufficiency (chronic) (peripheral): Secondary | ICD-10-CM

## 2021-05-29 NOTE — Assessment & Plan Note (Signed)
Blood pressure is controlled, takes Amlodipine, Bun/creat 15/1.1 01/15/21

## 2021-05-29 NOTE — Assessment & Plan Note (Signed)
Vit B12 228 04/03/20, added Vit B 216/22, Hgb 12.7 12/29/20

## 2021-05-29 NOTE — Assessment & Plan Note (Signed)
on Benzonatate  

## 2021-05-29 NOTE — Progress Notes (Signed)
Location:   [facility] Room Number: 824 Place of Service:  ALF (13) Provider: Lennie Odor Edyth Glomb NP  [name], MD  Patient Care Team: [name], MD as PCP - General (Internal Medicine) [name], MD as Consulting Physician (Cardiology)  Extended Emergency Contact Information Primary Emergency Contact: Osborne Casco Address: [address]          [place], [place] [address] [place] of [place] Phone: [phone] Mobile Phone: [phone] Relation: Niece  Code Status: DNR Goals of care: Advanced Directive information Advanced Directives 05/29/2021  Does Patient Have a Medical Advance Directive? Yes  Type of Advance Directive [place]  Does patient want to make changes to medical advance directive? No - Patient declined  Copy of [place] in Chart? Yes - validated most recent copy scanned in chart (See row information)  Would patient like information on creating a medical advance directive? -  Pre-existing out of facility DNR order (yellow form or pink MOST form) -     Chief Complaint  Patient presents with   Acute Visit    COVID positive, cough    HPI:  Pt is a 87 y.o. female seen today for an acute visit for increased cough from her baseline, fatigue, tested positive COVID. She is afebrile, denied sore throat, headache, aching, change of appetite, SOB, or phlegm production.   Chronic cough, on Benzonatate    CKD, stage 3 Bun/creat 15/1.1 01/15/21             GERD, better indigestion, burping, frequent use of Mylanta, on Omeprazole. Hgb 12.7 12/29/20             Edema BLE-not apparent, uses  Furosemide, DOE, dry cough, not new,  EF 50-55% 06/01/2017. Bun/creat 15/1.1 01/15/21             HTN, takes Amlodipine, Bun/creat 15/1.1 01/15/21             Hyperlipidemia, LDL 73 04/03/20, takes Rosuvastatin             Dementia, functioning well in [facility], 06/26/20 MMSE 23/30, failed clock drawing.               Anemia, Vit B12 228 04/03/20, added Vit B 216/22, Hgb 12.7 12/29/20    Past Medical History:  Diagnosis Date   Arthritis    "right thumb" (01/29/2015)   Chronic shoulder pain    "between my shoulders" (01/29/2015)   Dizziness    Esophageal stricture 2009   Gallstones    GERD (gastroesophageal reflux disease) 2004   Hemorrhoids 2004   Hiatal hernia 2004   History of blood transfusion 1947   "appendix ruptured"   HOH (hard of hearing)    Hypertension    Rhinitis, allergic    Syncopal episodes    Past Surgical History:  Procedure Laterality Date   APPENDECTOMY  1947   "ruptured"   CARDIAC CATHETERIZATION  10/23/1999; 09/2014   EF 60% No significant CAD; patent coronary arteries.   CARPAL TUNNEL RELEASE Right 1990's   CHOLECYSTECTOMY N/A 01/29/2015   Procedure: LAPAROSCOPIC CHOLECYSTECTOMY;  Surgeon: Rolm Bookbinder, MD;  Location: [facility];  Service: General;  Laterality: N/A;   DILATION AND CURETTAGE OF UTERUS  "couple"   ESOPHAGOGASTRODUODENOSCOPY (EGD) WITH ESOPHAGEAL DILATION  2-3 times   HEMORROIDECTOMY     KNEE ARTHROSCOPY Bilateral    right x 2   LAPAROSCOPIC CHOLECYSTECTOMY  01/29/2015   LEFT HEART CATHETERIZATION  WITH CORONARY ANGIOGRAM N/A 09/16/2014   Procedure: LEFT HEART CATHETERIZATION WITH CORONARY ANGIOGRAM;  Surgeon: [name], MD;  Location[facility] CATH LAB;  Service: Cardiovascular;  Laterality: N/A;   NM MYOVIEW LTD  06/01/2012   EF 71%   VAGINAL HYSTERECTOMY  1970's    Allergies  Allergen Reactions   Contrast Media [Iodinated Contrast Media] Itching and Rash    Pt covered in rash, red.   Codeine Nausea And Vomiting    in large doses: can tolerate Tussionex    Allergies as of 05/29/2021       Reactions   Contrast Media [iodinated Contrast Media] Itching, Rash   Pt covered in rash, red.   Codeine Nausea And Vomiting   in large doses: can tolerate Tussionex        Medication List        Accurate as of May 29, 2021 11:59 PM. If you  have any questions, ask your nurse or doctor.          amLODipine 2.5 MG tablet Commonly known as: NORVASC Take 1 tablet (2.5 mg total) by mouth daily.   benzonatate 100 MG capsule Commonly known as: TESSALON Take 1 capsule by mouth 3 (three) times daily.   ferrous sulfate 325 (65 FE) MG tablet Take 325 mg by mouth. Monday, Wednesday and Fridays   furosemide 20 MG tablet Commonly known as: LASIX Take 20 mg by mouth daily.   HM Calcium-Vitamin D 600-20 MG-MCG Tabs Generic drug: Calcium Carb-Cholecalciferol Take 1 tablet by mouth 2 (two) times daily.   molnupiravir EUA 200 MG Caps capsule Commonly known as: LAGEVRIO Take 4 capsules by mouth every 12 (twelve) hours.   omeprazole 20 MG capsule Commonly known as: PRILOSEC Take 20 mg by mouth daily.   rosuvastatin 20 MG tablet Commonly known as: CRESTOR Take 1 tablet (20 mg total) by mouth daily.   vitamin B-12 1000 MCG tablet Commonly known as: CYANOCOBALAMIN Take 1,000 mcg by mouth daily.   Vitamin D3 50 MCG (2000 UT) Tabs Take 1 capsule by mouth daily.        Review of Systems  Constitutional:  Positive for fatigue. Negative for appetite change and fever.  HENT:  Positive for hearing loss. Negative for congestion, rhinorrhea, sore throat and trouble swallowing.   Respiratory:  Positive for cough. Negative for chest tightness and shortness of breath.   Cardiovascular:  Negative for leg swelling.  Gastrointestinal:  Negative for abdominal pain and constipation.  Genitourinary:  Positive for frequency. Negative for dysuria and urgency.       Baseline urinary frequency  Musculoskeletal:  Positive for gait problem.  Skin:  Negative for color change.  Neurological:  Negative for speech difficulty, weakness and light-headedness.       Memory lapses.   Psychiatric/Behavioral:  Negative for confusion and sleep disturbance. The patient is not nervous/anxious.    Immunization History  Administered Date(s) Administered    Influenza Split 03/16/2011, 03/01/2012   Influenza Whole 03/28/2007, 04/01/2009, 02/18/2010   Influenza, High Dose Seasonal PF 03/14/2013   Influenza,inj,Quad PF,6+ Mos 03/20/2014, 03/11/2015   Influenza-Unspecified 08/06/2018, 03/20/2020   Moderna SARS-COV2 Booster Vaccination 11/04/2020   Moderna Sars-Covid-2 Vaccination 06/11/2019, 07/09/2019, 02/24/2021   Pneumococcal Conjugate-13 02/25/2015   Pneumococcal Polysaccharide-23 03/28/2007   Td 12/20/2005   Pertinent  Health Maintenance Due  Topic Date Due   MAMMOGRAM  09/15/2013   INFLUENZA VACCINE  01/05/2021   DEXA SCAN  Completed   Fall Risk 08/25/2015 12/28/2016 02/15/2017 04/21/2018  04/27/2019  Falls in the past year? No No No 0 0   Functional Status Survey:    Vitals:   05/29/21 1147  BP: 124/80  Pulse: 80  Resp: 16  Temp: 98.6 F (37 C)  SpO2: 95%  Weight: 198 lb 9.6 oz (90.1 kg)  Height: 5' (1.524 m)   Body mass index is 38.79 kg/m. Physical Exam Constitutional:      Comments: Appears tired.   HENT:     Head: Normocephalic and atraumatic.     Nose: Nose normal.     Mouth/Throat:     Mouth: Mucous membranes are dry.  Eyes:     Extraocular Movements: Extraocular movements intact.     Conjunctiva/sclera: Conjunctivae normal.     Pupils: Pupils are equal, round, and reactive to light.  Cardiovascular:     Rate and Rhythm: Normal rate and regular rhythm.     Heart sounds: No murmur heard. Pulmonary:     Effort: Pulmonary effort is normal.     Breath sounds: No wheezing, rhonchi or rales.  Abdominal:     General: Bowel sounds are normal.     Palpations: Abdomen is soft.     Tenderness: There is no abdominal tenderness.  Musculoskeletal:     Cervical back: Normal range of motion and neck supple.     Right lower leg: No edema.     Left lower leg: No edema.     Comments: BLE chronic venous insufficiency skin changes.   Skin:    General: Skin is warm and dry.  Neurological:     General: No focal deficit  present.     Mental Status: She is alert. Mental status is at baseline.     Gait: Gait abnormal.     Comments: Oriented to person, her room on unit.   Psychiatric:        Mood and Affect: Mood normal.        Behavior: Behavior normal.    Labs reviewed: Recent Labs    12/29/20 0000 01/06/21 0000 01/15/21 0000  NA 137 139 140  K 3.4 3.6 4.4  CL 100 98* 103  CO2 29* 33* 29*  BUN 17 26* 15  CREATININE 1.3* 1.2* 1.1  CALCIUM 8.5* 8.6* 8.8   No results for input(s): AST, ALT, ALKPHOS, BILITOT, PROT, ALBUMIN in the last 8760 hours. Recent Labs    07/05/20 0000 08/19/20 0000 08/27/20 0000 09/25/20 0000 12/29/20 0000  WBC 11.2 8.6 6.4 7.3 10.3  NEUTROABS 7,134.00 4,395.00  --   --  6,149.00  HGB 10.3* 9.9* 9.5* 11.3* 12.7  HCT 32* 32* 31* 37 38  PLT 264 17* 244 275 212   Lab Results  Component Value Date   TSH 2.55 04/03/2020   Lab Results  Component Value Date   HGBA1C 5.8 07/11/2017   Lab Results  Component Value Date   CHOL 147 04/03/2020   HDL 51 04/03/2020   LDLCALC 73 04/03/2020   LDLDIRECT 161.2 07/18/2013   TRIG 143 04/03/2020   CHOLHDL 2.9 04/03/2020    Significant Diagnostic Results in last 30 days:  No results found.  Assessment/Plan: COVID-19 virus infection increased cough from her baseline, fatigue, tested positive COVID. She is afebrile, denied sore throat, headache, aching, change of appetite, SOB, or phlegm production. Will treat with Lagevrio 800mg  q12hrs x 5 days. Observe.   COUGH, CHRONIC  on Benzonatate   GERD better indigestion, burping, frequent use of Mylanta, on Omeprazole. Hgb 12.7 12/29/20  Edema  of both lower extremities due to peripheral venous insufficiency not apparent, uses  Furosemide, DOE, dry cough, not new,  EF 50-55% 06/01/2017. Bun/creat 15/1.1 01/15/21  Essential hypertension Blood pressure is controlled, takes Amlodipine, Bun/creat 15/1.1 01/15/21  Dyslipidemia LDL 73 04/03/20, takes Rosuvastatin  Senile dementia  ([place]) unctioning well in [facility], 06/26/20 MMSE 23/30, failed clock drawing.   Vitamin B12 deficiency anemia Vit B12 228 04/03/20, added Vit B 216/22, Hgb 12.7 12/29/20    Family/ staff Communication: plan of care reviewed with the patient and charge nurse.   Labs/tests ordered:  none  Time spend 40 minutes.

## 2021-05-29 NOTE — Assessment & Plan Note (Signed)
unctioning well in [facility], 06/26/20 MMSE 23/30, failed clock drawing.

## 2021-05-29 NOTE — Assessment & Plan Note (Signed)
not apparent, uses  Furosemide, DOE, dry cough, not new,  EF 50-55% 06/01/2017. Bun/creat 15/1.1 01/15/21

## 2021-05-29 NOTE — Assessment & Plan Note (Signed)
increased cough from her baseline, fatigue, tested positive COVID. She is afebrile, denied sore throat, headache, aching, change of appetite, SOB, or phlegm production. Will treat with Lagevrio 800mg  q12hrs x 5 days. Observe.

## 2021-05-29 NOTE — Assessment & Plan Note (Signed)
LDL 73 04/03/20, takes Rosuvastatin

## 2021-05-29 NOTE — Assessment & Plan Note (Signed)
better indigestion, burping, frequent use of Mylanta, on Omeprazole. Hgb 12.7 12/29/20

## 2021-06-02 ENCOUNTER — Encounter: Payer: Self-pay | Admitting: Nurse Practitioner

## 2021-06-18 DIAGNOSIS — E7849 Other hyperlipidemia: Secondary | ICD-10-CM | POA: Diagnosis not present

## 2021-06-18 DIAGNOSIS — I10 Essential (primary) hypertension: Secondary | ICD-10-CM | POA: Diagnosis not present

## 2021-06-18 LAB — LIPID PANEL
Cholesterol: 136 (ref 0–200)
HDL: 34 — AB (ref 35–70)
LDL Cholesterol: 77
LDl/HDL Ratio: 4
Triglycerides: 151 (ref 40–160)

## 2021-07-28 ENCOUNTER — Encounter: Payer: Self-pay | Admitting: Internal Medicine

## 2021-07-28 ENCOUNTER — Non-Acute Institutional Stay: Payer: Medicare Other | Admitting: Internal Medicine

## 2021-07-28 DIAGNOSIS — F0390 Unspecified dementia without behavioral disturbance: Secondary | ICD-10-CM | POA: Diagnosis not present

## 2021-07-28 DIAGNOSIS — R053 Chronic cough: Secondary | ICD-10-CM | POA: Diagnosis not present

## 2021-07-28 DIAGNOSIS — E785 Hyperlipidemia, unspecified: Secondary | ICD-10-CM | POA: Diagnosis not present

## 2021-07-28 DIAGNOSIS — I10 Essential (primary) hypertension: Secondary | ICD-10-CM | POA: Diagnosis not present

## 2021-07-28 DIAGNOSIS — D649 Anemia, unspecified: Secondary | ICD-10-CM

## 2021-07-28 DIAGNOSIS — I872 Venous insufficiency (chronic) (peripheral): Secondary | ICD-10-CM | POA: Diagnosis not present

## 2021-07-28 DIAGNOSIS — K219 Gastro-esophageal reflux disease without esophagitis: Secondary | ICD-10-CM | POA: Diagnosis not present

## 2021-07-28 DIAGNOSIS — N1831 Chronic kidney disease, stage 3a: Secondary | ICD-10-CM | POA: Diagnosis not present

## 2021-07-28 NOTE — Progress Notes (Signed)
Location:   [facility] Room Number: Branch of Service:  ALF [phone]) Provider:  Veleta Miners MD   [name], MD  Patient Care Team: [name], MD as PCP - General (Internal Medicine) [name], MD as Consulting Physician (Cardiology)  Extended Emergency Contact Information Primary Emergency Contact: Osborne Casco Address: [address]          [place],  [address] [place] Phone: [phone] Mobile Phone: [phone] Relation: Niece  Code Status:  Full Code Goals of care: Advanced Directive information Advanced Directives 07/28/2021  Does Patient Have a Medical Advance Directive? Yes  Type of Advance Directive [place]  Does patient want to make changes to medical advance directive? No - Patient declined  Copy of [place] in Chart? Yes - validated most recent copy scanned in chart (See row information)  Would patient like information on creating a medical advance directive? -  Pre-existing out of facility DNR order (yellow form or pink MOST form) -     Chief Complaint  Patient presents with   Medical Management of Chronic Issues   Quality Metric Gaps    Zoster Vaccines- Shingrix (1 of 2)  2015 MAMMOGRAM (Yearly) 2017 TETANUS/TDAP (Every 10 Years) 2022 COVID-19 Vaccine       HPI:  Pt is a 88 y.o. female seen today for medical management of chronic diseases.    Patient has a history of hypertension, hyperlipidemia, dementia, B12 deficiency Also h/o Chronic Cough Covid Diagnosis in 12/22    She is stable. No new Nursing issues. No Behavior issues Walks with her walker No Falls Wt Readings from Last 3 Encounters:  07/28/21 190 lb (86.2 kg)  05/29/21 198 lb 9.6 oz (90.1 kg)  05/06/21 198 lb 9.6 oz (90.1 kg)  Mild Cough Present  Past Medical History:  Diagnosis Date   Arthritis    "right thumb" (01/29/2015)   Chronic shoulder pain     "between my shoulders" (01/29/2015)   Dizziness    Esophageal stricture 2009   Gallstones    GERD (gastroesophageal reflux disease) 2004   Hemorrhoids 2004   Hiatal hernia 2004   History of blood transfusion 1947   "appendix ruptured"   HOH (hard of hearing)    Hypertension    Rhinitis, allergic    Syncopal episodes    Past Surgical History:  Procedure Laterality Date   APPENDECTOMY  1947   "ruptured"   CARDIAC CATHETERIZATION  10/23/1999; 09/2014   EF 60% No significant CAD; patent coronary arteries.   CARPAL TUNNEL RELEASE Right 1990's   CHOLECYSTECTOMY N/A 01/29/2015   Procedure: LAPAROSCOPIC CHOLECYSTECTOMY;  Surgeon: Rolm Bookbinder, MD;  Location: [facility];  Service: General;  Laterality: N/A;   DILATION AND CURETTAGE OF UTERUS  "couple"   ESOPHAGOGASTRODUODENOSCOPY (EGD) WITH ESOPHAGEAL DILATION  2-3 times   HEMORROIDECTOMY     KNEE ARTHROSCOPY Bilateral    right x 2   LAPAROSCOPIC CHOLECYSTECTOMY  01/29/2015   LEFT HEART CATHETERIZATION WITH CORONARY ANGIOGRAM N/A 09/16/2014   Procedure: LEFT HEART CATHETERIZATION WITH CORONARY ANGIOGRAM;  Surgeon: [name], MD;  Location[facility] C[facility] LAB;  Service: Cardiovascular;  Laterality: N/A;   NM MYOVIEW LTD  06/01/2012   EF 71%   VAGINAL HYSTERECTOMY  1970's    Allergies  Allergen Reactions   Contrast Media [Iodinated Contrast Media] Itching and Rash    Pt covered in rash, red.   Codeine  Nausea And Vomiting    in large doses: can tolerate Tussionex    Allergies as of 07/28/2021       Reactions   Contrast Media [iodinated Contrast Media] Itching, Rash   Pt covered in rash, red.   Codeine Nausea And Vomiting   in large doses: can tolerate Tussionex        Medication List        Accurate as of July 28, 2021  1:48 PM. If you have any questions, ask your nurse or doctor.          STOP taking these medications    molnupiravir EUA 200 MG Caps capsule Commonly known as: LAGEVRIO Stopped by: [name],  MD       TAKE these medications    amLODipine 2.5 MG tablet Commonly known as: NORVASC Take 1 tablet (2.5 mg total) by mouth daily.   benzonatate 100 MG capsule Commonly known as: TESSALON Take 1 capsule by mouth 3 (three) times daily.   ferrous sulfate 325 (65 FE) MG tablet Take 325 mg by mouth. Monday, Wednesday and Fridays   furosemide 20 MG tablet Commonly known as: LASIX Take 20 mg by mouth daily.   HM Calcium-Vitamin D 600-20 MG-MCG Tabs Generic drug: Calcium Carb-Cholecalciferol Take 1 tablet by mouth 2 (two) times daily.   omeprazole 20 MG capsule Commonly known as: PRILOSEC Take 20 mg by mouth daily.   rosuvastatin 20 MG tablet Commonly known as: CRESTOR Take 1 tablet (20 mg total) by mouth daily.   vitamin B-12 1000 MCG tablet Commonly known as: CYANOCOBALAMIN Take 1,000 mcg by mouth daily.   Vitamin D3 50 MCG (2000 UT) Tabs Take 1 capsule by mouth daily.        Review of Systems  Constitutional:  Negative for activity change and appetite change.  HENT: Negative.    Respiratory:  Positive for cough. Negative for shortness of breath.   Cardiovascular:  Negative for leg swelling.  Gastrointestinal:  Negative for constipation.  Genitourinary: Negative.   Musculoskeletal:  Negative for arthralgias, gait problem and myalgias.  Skin: Negative.   Neurological:  Negative for dizziness and weakness.  Psychiatric/Behavioral:  Negative for confusion, dysphoric mood and sleep disturbance.    Immunization History  Administered Date(s) Administered   Influenza Split 03/16/2011, 03/01/2012   Influenza Whole 03/28/2007, 04/01/2009, 02/18/2010   Influenza, High Dose Seasonal PF 03/14/2013   Influenza,inj,Quad PF,6+ Mos 03/20/2014, 03/11/2015   Influenza-Unspecified 08/06/2018, 03/20/2020   Moderna SARS-COV2 Booster Vaccination 11/04/2020   Moderna Sars-Covid-2 Vaccination 06/11/2019, 07/09/2019, 02/24/2021   Pneumococcal Conjugate-13 02/25/2015    Pneumococcal Polysaccharide-23 03/28/2007   Td 12/20/2005   Pertinent  Health Maintenance Due  Topic Date Due   MAMMOGRAM  09/15/2013   INFLUENZA VACCINE  09/04/2021 (Originally 01/05/2021)   DEXA SCAN  Completed   Fall Risk 08/25/2015 12/28/2016 02/15/2017 04/21/2018 04/27/2019  Falls in the past year? No No No 0 0   Functional Status Survey:    Vitals:   07/28/21 1344  BP: 130/76  Pulse: 72  Resp: 20  Temp: (!) 97.2 F (36.2 C)  SpO2: 97%  Weight: 190 lb (86.2 kg)  Height: 5' (1.524 m)   Body mass index is 37.11 kg/m. Physical Exam Vitals reviewed.  Constitutional:      Appearance: Normal appearance.  HENT:     Head: Normocephalic.     Nose: Nose normal.     Mouth/Throat:     Mouth: Mucous membranes are moist.  Pharynx: Oropharynx is clear.  Eyes:     Pupils: Pupils are equal, round, and reactive to light.  Cardiovascular:     Rate and Rhythm: Normal rate and regular rhythm.     Pulses: Normal pulses.     Heart sounds: Normal heart sounds. No murmur heard. Pulmonary:     Effort: Pulmonary effort is normal.     Breath sounds: Normal breath sounds.  Abdominal:     General: Abdomen is flat. Bowel sounds are normal.     Palpations: Abdomen is soft.  Musculoskeletal:        General: No swelling.     Cervical back: Neck supple.  Skin:    General: Skin is warm.  Neurological:     General: No focal deficit present.     Mental Status: She is alert and oriented to person, place, and time.  Psychiatric:        Mood and Affect: Mood normal.        Thought Content: Thought content normal.    Labs reviewed: Recent Labs    12/29/20 0000 01/06/21 0000 01/15/21 0000  NA 137 139 140  K 3.4 3.6 4.4  CL 100 98* 103  CO2 29* 33* 29*  BUN 17 26* 15  CREATININE 1.3* 1.2* 1.1  CALCIUM 8.5* 8.6* 8.8   No results for input(s): AST, ALT, ALKPHOS, BILITOT, PROT, ALBUMIN in the last 8760 hours. Recent Labs    08/19/20 0000 08/27/20 0000 09/25/20 0000  12/29/20 0000  WBC 8.6 6.4 7.3 10.3  NEUTROABS 4,395.00  --   --  6,149.00  HGB 9.9* 9.5* 11.3* 12.7  HCT 32* 31* 37 38  PLT 17* 244 275 212   Lab Results  Component Value Date   TSH 2.55 04/03/2020   Lab Results  Component Value Date   HGBA1C 5.8 07/11/2017   Lab Results  Component Value Date   CHOL 136 06/18/2021   HDL 34 (A) 06/18/2021   LDLCALC 77 06/18/2021   LDLDIRECT 161.2 07/18/2013   TRIG 151 06/18/2021   CHOLHDL 2.9 04/03/2020    Significant Diagnostic Results in last 30 days:  No results found.  Assessment/Plan 1. Chronic cough On Tessalon Pearls  2. Gastroesophageal reflux disease, unspecified whether esophagitis present Continue Prilosec  3. Edema of both lower extremities due to peripheral venous insufficiency Low Dose of Lasix  4. Essential hypertension Low dose of Norvasc  5. Dyslipidemia On statin LDL less then 100  6. Senile dementia ([place]) Supportive care Doing well in [place] 7. Anemia, unspecified type On Iron and B 12  8. Stage 3a chronic kidney disease ([place]) Creat stable    Family/ staff Communication:   Labs/tests ordered:

## 2021-09-03 ENCOUNTER — Encounter: Payer: Self-pay | Admitting: Nurse Practitioner

## 2021-09-03 ENCOUNTER — Non-Acute Institutional Stay: Payer: Medicare Other | Admitting: Nurse Practitioner

## 2021-09-03 DIAGNOSIS — I10 Essential (primary) hypertension: Secondary | ICD-10-CM

## 2021-09-03 DIAGNOSIS — D519 Vitamin B12 deficiency anemia, unspecified: Secondary | ICD-10-CM | POA: Diagnosis not present

## 2021-09-03 DIAGNOSIS — D72829 Elevated white blood cell count, unspecified: Secondary | ICD-10-CM

## 2021-09-03 DIAGNOSIS — N1831 Chronic kidney disease, stage 3a: Secondary | ICD-10-CM | POA: Diagnosis not present

## 2021-09-03 DIAGNOSIS — R112 Nausea with vomiting, unspecified: Secondary | ICD-10-CM | POA: Diagnosis not present

## 2021-09-03 DIAGNOSIS — R300 Dysuria: Secondary | ICD-10-CM | POA: Diagnosis not present

## 2021-09-03 DIAGNOSIS — I872 Venous insufficiency (chronic) (peripheral): Secondary | ICD-10-CM

## 2021-09-03 DIAGNOSIS — E785 Hyperlipidemia, unspecified: Secondary | ICD-10-CM

## 2021-09-03 DIAGNOSIS — F0390 Unspecified dementia without behavioral disturbance: Secondary | ICD-10-CM

## 2021-09-03 DIAGNOSIS — J40 Bronchitis, not specified as acute or chronic: Secondary | ICD-10-CM | POA: Diagnosis not present

## 2021-09-03 DIAGNOSIS — K219 Gastro-esophageal reflux disease without esophagitis: Secondary | ICD-10-CM

## 2021-09-03 LAB — BASIC METABOLIC PANEL WITH GFR
BUN: 17 (ref 4–21)
CO2: 29 — AB (ref 13–22)
Chloride: 95 — AB (ref 99–108)
Creatinine: 1.9 — AB (ref 0.5–1.1)
Glucose: 156
Potassium: 3.3 meq/L — AB (ref 3.5–5.1)
Sodium: 134 — AB (ref 137–147)

## 2021-09-03 LAB — CBC AND DIFFERENTIAL
HCT: 46 (ref 36–46)
Hemoglobin: 15.3 (ref 12.0–16.0)
Neutrophils Absolute: 7836
Platelets: 11 10*3/uL — AB (ref 150–400)
WBC: 12.7

## 2021-09-03 LAB — CBC: RBC: 4.66 (ref 3.87–5.11)

## 2021-09-03 LAB — COMPREHENSIVE METABOLIC PANEL WITH GFR
Albumin: 3.9 (ref 3.5–5.0)
Calcium: 9.3 (ref 8.7–10.7)
Globulin: 2.5
eGFR: 25

## 2021-09-03 LAB — HEPATIC FUNCTION PANEL
ALT: 108 U/L — AB (ref 7–35)
AST: 134 — AB (ref 13–35)
Alkaline Phosphatase: 830 — AB (ref 25–125)
Bilirubin, Total: 2.9

## 2021-09-03 NOTE — Progress Notes (Addendum)
?Location:   [facility] ?Nursing Home Room Number: 191 A ?Place of Service:  ALF (13) ?Provider:  Curtez Brallier X, NP ? ?[name], MD ? ?Patient Care Team: ?[name], MD as PCP - General (Internal Medicine) ?[name], MD as Consulting Physician (Cardiology) ? ?Extended Emergency Contact Information ?Primary Emergency Contact: Osborne Casco ?Address: [address] ?         [place], [place] [address] [place] ?Home Phone: [phone] ?Mobile Phone: [phone] ?Relation: Niece ? ?Code Status:  FULL CODE ?Goals of care: Advanced Directive information ? ?  07/28/2021  ?  1:47 PM  ?Advanced Directives  ?Does Patient Have a Medical Advance Directive? Yes  ?Type of Advance Directive Healthcare Power of Attorney  ?Does patient want to make changes to medical advance directive? No - Patient declined  ?Copy of [place] in Chart? Yes - validated most recent copy scanned in chart (See row information)  ? ? ? ?Chief Complaint  ?Patient presents with  ?? Acute Visit  ?  UTI  ? ? ?HPI:  ?Pt is a 88 y.o. female seen today for an acute visit for c/o nausea, vomiting x 2 days, subsided today. Admitted poor appetite. Denied dysuria, increased urinary frequency, urgency, denied abd pain. She is afebrile.  ?  ? Chronic cough, on Benzonatate  ?             CKD, stage 3  ?            GERD, better indigestion, burping, frequent use of Mylanta, on Omeprazole.  ?            Edema BLE-not apparent, uses  Furosemide, DOE, dry cough, not new,  EF 50-55% 06/01/2017.  ?            HTN, takes Amlodipine ?            Hyperlipidemia, LDL 77 06/18/21, takes Rosuvastatin ?            Dementia, functioning well in [place] [facility], 06/26/20 MMSE 23/30, failed clock drawing.  ?            Anemia, Vit B12 228 04/03/20, added Vit B 216/22 ?  ? ?Past Medical History:  ?Diagnosis Date  ?? Arthritis   ? "right thumb" (01/29/2015)  ?? Chronic shoulder pain   ? "between my shoulders" (01/29/2015)  ??  Dizziness   ?? Esophageal stricture 2009  ?? Gallstones   ?? GERD (gastroesophageal reflux disease) 2004  ?? Hemorrhoids 2004  ?? Hiatal hernia 2004  ?? History of blood transfusion 1947  ? "appendix ruptured"  ?? HOH (hard of hearing)   ?? Hypertension   ?? Rhinitis, allergic   ?? Syncopal episodes   ? ?Past Surgical History:  ?Procedure Laterality Date  ?? APPENDECTOMY  1947  ? "ruptured"  ?? CARDIAC CATHETERIZATION  10/23/1999; 09/2014  ? EF 60% No significant CAD; patent coronary arteries.  ?? CARPAL TUNNEL RELEASE Right 1990's  ?? CHOLECYSTECTOMY N/A 01/29/2015  ? Procedure: LAPAROSCOPIC CHOLECYSTECTOMY;  Surgeon: Rolm Bookbinder, MD;  Location: [facility];  Service: General;  Laterality: N/A;  ?? DILATION AND CURETTAGE OF UTERUS  "couple"  ?? ESOPHAGOGASTRODUODENOSCOPY (EGD) WITH ESOPHAGEAL DILATION  2-3 times  ?? HEMORROIDECTOMY    ?? KNEE ARTHROSCOPY Bilateral   ? right x 2  ?? LAPAROSCOPIC CHOLECYSTECTOMY  01/29/2015  ?? LEFT HEART CATHETERIZATION WITH CORONARY ANGIOGRAM N/A 09/16/2014  ? Procedure: LEFT HEART CATHETERIZATION WITH CORONARY  ANGIOGRAM;  Surgeon: [name], MD;  Location: [facility] CATH LAB;  Service: Cardiovascular;  Laterality: N/A;  ?? NM MYOVIEW LTD  06/01/2012  ? EF 71%  ?? VAGINAL HYSTERECTOMY  1970's  ? ? ?Allergies  ?Allergen Reactions  ?? Contrast Media [Iodinated Contrast Media] Itching and Rash  ?  Pt covered in rash, red.  ?? Codeine Nausea And Vomiting  ?  in large doses: can tolerate Tussionex  ? ? ?Allergies as of 09/03/2021   ? ?   Reactions  ? Contrast Media [iodinated Contrast Media] Itching, Rash  ? Pt covered in rash, red.  ? Codeine Nausea And Vomiting  ? in large doses: can tolerate Tussionex  ? ?  ? ?  ?Medication List  ?  ? ?  ? Accurate as of September 03, 2021 11:59 PM. If you have any questions, ask your nurse or doctor.  ?  ?  ? ?  ? ?alum & mag hydroxide-simeth [phone] MG/5ML suspension ?Commonly known as: MAALOX PLUS ?Take 10 mLs by mouth every 6 (six) hours as needed for  indigestion or heartburn. ?  ?amLODipine 2.5 MG tablet ?Commonly known as: NORVASC ?Take 1 tablet (2.5 mg total) by mouth daily. ?  ?benzonatate 100 MG capsule ?Commonly known as: TESSALON ?Take 1 capsule by mouth 3 (three) times daily. ?  ?ferrous sulfate 325 (65 FE) MG tablet ?Take 325 mg by mouth. Monday, Wednesday and Fridays ?  ?furosemide 20 MG tablet ?Commonly known as: LASIX ?Take 20 mg by mouth daily. ?  ?HM Calcium-Vitamin D 600-20 MG-MCG Tabs ?Generic drug: Calcium Carb-Cholecalciferol ?Take 1 tablet by mouth 2 (two) times daily. ?  ?omeprazole 20 MG capsule ?Commonly known as: PRILOSEC ?Take 20 mg by mouth daily. ?  ?rosuvastatin 20 MG tablet ?Commonly known as: CRESTOR ?Take 1 tablet (20 mg total) by mouth daily. ?  ?vitamin B-12 1000 MCG tablet ?Commonly known as: CYANOCOBALAMIN ?Take 1,000 mcg by mouth daily. ?  ?Vitamin D3 50 MCG (2000 UT) Tabs ?Take 1 capsule by mouth daily. ?  ? ?  ? ? ?Review of Systems  ?Constitutional:  Positive for appetite change, fatigue and unexpected weight change. Negative for fever.  ?     Weight loss about #5Ibs in the past month. Lack of appetite about 2-3 days.   ?HENT:  Positive for hearing loss. Negative for congestion, rhinorrhea, sore throat and trouble swallowing.   ?Respiratory:  Positive for cough. Negative for chest tightness and shortness of breath.   ?Cardiovascular:  Negative for leg swelling.  ?     N/V 2 days, subsided today.   ?Gastrointestinal:  Positive for nausea and vomiting. Negative for abdominal pain and constipation.  ?Genitourinary:  Positive for frequency. Negative for dysuria and urgency.  ?     Baseline urinary frequency  ?Musculoskeletal:  Positive for gait problem.  ?Skin:  Negative for color change.  ?Neurological:  Negative for speech difficulty, weakness and light-headedness.  ?     Memory lapses.   ?Psychiatric/Behavioral:  Negative for confusion and sleep disturbance. The patient is not nervous/anxious.   ? ?Immunization History   ?Administered Date(s) Administered  ?? Influenza Split 03/16/2011, 03/01/2012  ?? Influenza Whole 03/28/2007, 04/01/2009, 02/18/2010  ?? Influenza, High Dose Seasonal PF 03/14/2013  ?? Influenza,inj,Quad PF,6+ Mos 03/20/2014, 03/11/2015  ?? Influenza-Unspecified 08/06/2018, 03/20/2020  ?? Moderna SARS-COV2 Booster Vaccination 11/04/2020  ?? Moderna Sars-Covid-2 Vaccination 06/11/2019, 07/09/2019, 02/24/2021  ?? Pneumococcal Conjugate-13 02/25/2015  ?? Pneumococcal Polysaccharide-23 03/28/2007  ?? Td 12/20/2005  ? ?  Pertinent  Health Maintenance Due  ?Topic Date Due  ?? MAMMOGRAM  09/15/2013  ?? INFLUENZA VACCINE  01/05/2022  ?? DEXA SCAN  Completed  ? ? ?  08/25/2015  ? 11:26 AM 12/28/2016  ?  9:35 AM 02/15/2017  ? 10:04 AM 04/21/2018  ?  1:12 PM 04/27/2019  ? 10:06 AM  ?Fall Risk  ?Falls in the past year? No No No 0 0  ? ?Functional Status Survey: ?  ? ?Vitals:  ? 09/03/21 1510  ?BP: 128/80  ?Pulse: 73  ?Resp: 20  ?Temp: 98.2 ?F (36.8 ?C)  ?SpO2: 95%  ?Weight: 184 lb 9.6 oz (83.7 kg)  ?Height: 5' (1.524 m)  ? ?Body mass index is 36.05 kg/m?[name] ?Physical Exam ?Constitutional:   ?   Comments: Appears tired.   ?HENT:  ?   Head: Normocephalic and atraumatic.  ?   Nose: Nose normal.  ?   Mouth/Throat:  ?   Mouth: Mucous membranes are dry.  ?Eyes:  ?   Extraocular Movements: Extraocular movements intact.  ?   Conjunctiva/sclera: Conjunctivae normal.  ?   Pupils: Pupils are equal, round, and reactive to light.  ?Cardiovascular:  ?   Rate and Rhythm: Normal rate and regular rhythm.  ?   Heart sounds: No murmur heard. ?Pulmonary:  ?   Effort: Pulmonary effort is normal.  ?   Breath sounds: No wheezing, rhonchi or rales.  ?Abdominal:  ?   General: Bowel sounds are normal.  ?   Palpations: Abdomen is soft.  ?   Tenderness: There is no abdominal tenderness.  ?Musculoskeletal:  ?   Cervical back: Normal range of motion and neck supple.  ?   Right lower leg: No edema.  ?   Left lower leg: No edema.  ?   Comments: BLE chronic venous  insufficiency skin changes.   ?Skin: ?   General: Skin is warm and dry.  ?Neurological:  ?   General: No focal deficit present.  ?   Mental Status: She is alert. Mental status is at baseline.  ?   Gait: Gait abn

## 2021-09-04 ENCOUNTER — Encounter: Payer: Self-pay | Admitting: Nurse Practitioner

## 2021-09-04 DIAGNOSIS — R0989 Other specified symptoms and signs involving the circulatory and respiratory systems: Secondary | ICD-10-CM | POA: Diagnosis not present

## 2021-09-04 DIAGNOSIS — D72829 Elevated white blood cell count, unspecified: Secondary | ICD-10-CM | POA: Insufficient documentation

## 2021-09-04 DIAGNOSIS — I10 Essential (primary) hypertension: Secondary | ICD-10-CM | POA: Diagnosis not present

## 2021-09-04 DIAGNOSIS — R059 Cough, unspecified: Secondary | ICD-10-CM | POA: Diagnosis not present

## 2021-09-04 DIAGNOSIS — N390 Urinary tract infection, site not specified: Secondary | ICD-10-CM | POA: Diagnosis not present

## 2021-09-04 NOTE — Assessment & Plan Note (Addendum)
functioning well in [place], 06/26/20 MMSE 23/30, failed clock drawing. 08/13/21 MOST limited additional interventions, ABT/IVF for a defined trial period, no feeding tube.  ?

## 2021-09-04 NOTE — Assessment & Plan Note (Signed)
Vit B12 228 04/03/20, added Vit B 216/22, Hgb normalized from 9.9 08/19/20.  ?

## 2021-09-04 NOTE — Assessment & Plan Note (Signed)
Blood pressure is controlled, continue Amlodipine.  

## 2021-09-04 NOTE — Assessment & Plan Note (Addendum)
09/03/21 wbc 12.7, Hgb 15.3, plt 303, neutrophils 61.7.  ?Hemoconcentration vs infection, will obtain UA C/S, CXR to r/o infection process, repeat CBC/diff next lab draw.  ?09/04/21 CXR no infiltrate, effusion, or acute findings.  ?

## 2021-09-04 NOTE — Assessment & Plan Note (Signed)
Chronic cough at her baseline,  on Benzonatate  ?

## 2021-09-04 NOTE — Assessment & Plan Note (Addendum)
Subsided. 09/03/21 CBC/diff, CMP/eGFR, monitor for s/s of UTI, VS q 4hrs x24 hours. Denied dysuria, increased urinary frequency, urgency, denied abd pain.  ?

## 2021-09-04 NOTE — Assessment & Plan Note (Signed)
LDL 77 06/18/21, takes Rosuvastatin ?

## 2021-09-04 NOTE — Assessment & Plan Note (Signed)
better indigestion, burping, frequent use of Mylanta, on Omeprazole.  ?

## 2021-09-04 NOTE — Assessment & Plan Note (Signed)
-  not apparent, uses  Furosemide, DOE, dry cough, not new,  EF 50-55% 06/01/2017.  ?

## 2021-09-04 NOTE — Assessment & Plan Note (Signed)
09/03/21 had nausea, vomiting about 2 days, not today, oral intake is poor. Na 134, K 3.3, Bun 17, creat 1.89. will hold Furosemide, Kcl 6mq x1, encourage oral fluid intake, repeat CMP next lab draw. ?

## 2021-09-08 DIAGNOSIS — N390 Urinary tract infection, site not specified: Secondary | ICD-10-CM | POA: Diagnosis not present

## 2021-09-09 ENCOUNTER — Encounter: Payer: Self-pay | Admitting: Orthopedic Surgery

## 2021-09-09 ENCOUNTER — Non-Acute Institutional Stay: Payer: Medicare Other | Admitting: Orthopedic Surgery

## 2021-09-09 DIAGNOSIS — D72829 Elevated white blood cell count, unspecified: Secondary | ICD-10-CM | POA: Diagnosis not present

## 2021-09-09 DIAGNOSIS — I10 Essential (primary) hypertension: Secondary | ICD-10-CM

## 2021-09-09 DIAGNOSIS — N1831 Chronic kidney disease, stage 3a: Secondary | ICD-10-CM | POA: Diagnosis not present

## 2021-09-09 DIAGNOSIS — E876 Hypokalemia: Secondary | ICD-10-CM | POA: Diagnosis not present

## 2021-09-09 DIAGNOSIS — I872 Venous insufficiency (chronic) (peripheral): Secondary | ICD-10-CM

## 2021-09-09 DIAGNOSIS — K219 Gastro-esophageal reflux disease without esophagitis: Secondary | ICD-10-CM

## 2021-09-09 DIAGNOSIS — R112 Nausea with vomiting, unspecified: Secondary | ICD-10-CM | POA: Diagnosis not present

## 2021-09-09 DIAGNOSIS — E785 Hyperlipidemia, unspecified: Secondary | ICD-10-CM

## 2021-09-09 DIAGNOSIS — J40 Bronchitis, not specified as acute or chronic: Secondary | ICD-10-CM

## 2021-09-09 DIAGNOSIS — F0390 Unspecified dementia without behavioral disturbance: Secondary | ICD-10-CM

## 2021-09-09 NOTE — Progress Notes (Addendum)
?Location:  [facility] ?Nursing Home Room Number: [id number] ?Place of Service:  ALF (13) ?Provider: Yvonna Alanis, NP ? ?Patient Care Team: ?[name], MD as PCP - General (Internal Medicine) ?[name], MD as Consulting Physician (Cardiology) ? ?Extended Emergency Contact Information ?Primary Emergency Contact: Osborne Casco ?Address: [address] ?         [place], [place] [address] [place] ?Home Phone: [phone] ?Mobile Phone: [phone] ?Relation: Niece ? ?Code Status:  Full code ?Goals of care: Advanced Directive information ? ?  09/09/2021  ?  1:52 PM  ?Advanced Directives  ?Does Patient Have a Medical Advance Directive? Yes  ?Type of Advance Directive Healthcare Power of Attorney  ?Does patient want to make changes to medical advance directive? No - Patient declined  ?Copy of [place] in Chart? Yes - validated most recent copy scanned in chart (See row information)  ? ? ? ?Chief Complaint  ?Patient presents with  ? Acute Visit  ?  Hypokalemia  ? ? ?HPI:  ?Pt is a 88 y.o. female seen today for an acute visit for hypokalemia.  ? ?03/30 she was seen for nausea and vomiting x 2 days. Symptoms subsided. 03/30 Lab work revealed WBC 12.7, hgb 15.3, platelets 303, neutrophils 61.7, creatinine 1.89, K+3.3, alkaline phos 830. CXR unremarkable. UA/culture with trace leukocytes. <100,000 cfu/mL gram negative organism. Furosemide discontinued until 4/05 lab review. She was given 1 dose KCL.  ? ?Today, she reports feeling much better. Tolerating foods. LBM 04/05, described as normal. Afebrile, vitals stable. Denies chest pain, sob, abdominal pain, dysuria and frequency. 04/04 BUN/creat 16/1.98, K+3.0, AST/ALT 108/89, alkaline phos 787,WBC 14.4, neutrophils 12427.  ? ?Dementia- MMSE 23/30 06/2020, no behavioral outbursts, 08/13/2021 MOST for updated- ABT/IVF for defined period of time/ no feeding tube ?Bronchitis- stable with benzonatate ?GERD- hgb 14.6  04/04, remains on Mylanta and omeprazole ?BLE- non pitting today, see above ?HTN- remains on amlodipine ?HLD- LDL 77 06/2021, AST/ALT elevated- see above, remains on Crestor ? ? ?Past Medical History:  ?Diagnosis Date  ? Arthritis   ? "right thumb" (01/29/2015)  ? Chronic shoulder pain   ? "between my shoulders" (01/29/2015)  ? Dizziness   ? Esophageal stricture 2009  ? Gallstones   ? GERD (gastroesophageal reflux disease) 2004  ? Hemorrhoids 2004  ? Hiatal hernia 2004  ? History of blood transfusion 1947  ? "appendix ruptured"  ? HOH (hard of hearing)   ? Hypertension   ? Rhinitis, allergic   ? Syncopal episodes   ? ?Past Surgical History:  ?Procedure Laterality Date  ? APPENDECTOMY  1947  ? "ruptured"  ? CARDIAC CATHETERIZATION  10/23/1999; 09/2014  ? EF 60% No significant CAD; patent coronary arteries.  ? CARPAL TUNNEL RELEASE Right 1990's  ? CHOLECYSTECTOMY N/A 01/29/2015  ? Procedure: LAPAROSCOPIC CHOLECYSTECTOMY;  Surgeon: Rolm Bookbinder, MD;  Location: [place];  Service: General;  Laterality: N/A;  ? DILATION AND CURETTAGE OF UTERUS  "couple"  ? ESOPHAGOGASTRODUODENOSCOPY (EGD) WITH ESOPHAGEAL DILATION  2-3 times  ? HEMORROIDECTOMY    ? KNEE ARTHROSCOPY Bilateral   ? right x 2  ? LAPAROSCOPIC CHOLECYSTECTOMY  01/29/2015  ? LEFT HEART CATHETERIZATION WITH CORONARY ANGIOGRAM N/A 09/16/2014  ? Procedure: LEFT HEART CATHETERIZATION WITH CORONARY ANGIOGRAM;  Surgeon: [name], MD;  Location: [facility] CATH LAB;  Service: Cardiovascular;  Laterality: N/A;  ? NM MYOVIEW LTD  06/01/2012  ? EF 71%  ? VAGINAL HYSTERECTOMY  1970's  ? ? ?  Allergies  ?Allergen Reactions  ? Contrast Media [Iodinated Contrast Media] Itching and Rash  ?  Pt covered in rash, red.  ? Codeine Nausea And Vomiting  ?  in large doses: can tolerate Tussionex  ? ? ?Outpatient Encounter Medications as of 09/09/2021  ?Medication Sig  ? alum & mag hydroxide-simeth (MAALOX PLUS) 400-400-40 MG/5ML suspension Take 10 mLs by mouth every 6 (six) hours as needed for  indigestion or heartburn.  ? amLODipine (NORVASC) 2.5 MG tablet Take 1 tablet (2.5 mg total) by mouth daily.  ? benzonatate (TESSALON) 100 MG capsule Take 1 capsule by mouth 3 (three) times daily.  ? Calcium Carb-Cholecalciferol (HM CALCIUM-VITAMIN D) 600-800 MG-UNIT TABS Take 1 tablet by mouth 2 (two) times daily.  ? Cholecalciferol (VITAMIN D3) 50 MCG (2000 UT) TABS Take 1 capsule by mouth daily.  ? ferrous sulfate 325 (65 FE) MG tablet Take 325 mg by mouth. Monday, Wednesday and Fridays  ? omeprazole (PRILOSEC) 20 MG capsule Take 20 mg by mouth daily.  ? rosuvastatin (CRESTOR) 20 MG tablet Take 1 tablet (20 mg total) by mouth daily.  ? vitamin B-12 (CYANOCOBALAMIN) 1000 MCG tablet Take 1,000 mcg by mouth daily.  ? [DISCONTINUED] furosemide (LASIX) 20 MG tablet Take 20 mg by mouth daily.  ? ?No facility-administered encounter medications on file as of 09/09/2021.  ? ? ?Review of Systems  ?Unable to perform ROS: Dementia  ? ?Immunization History  ?Administered Date(s) Administered  ? Influenza Split 03/16/2011, 03/01/2012  ? Influenza Whole 03/28/2007, 04/01/2009, 02/18/2010  ? Influenza, High Dose Seasonal PF 03/14/2013  ? Influenza,inj,Quad PF,6+ Mos 03/20/2014, 03/11/2015  ? Influenza-Unspecified 08/06/2018, 03/20/2020  ? Moderna SARS-COV2 Booster Vaccination 11/04/2020  ? Moderna Sars-Covid-2 Vaccination 06/11/2019, 07/09/2019, 02/24/2021  ? Pneumococcal Conjugate-13 02/25/2015  ? Pneumococcal Polysaccharide-23 03/28/2007  ? Td 12/20/2005  ? ?Pertinent  Health Maintenance Due  ?Topic Date Due  ? MAMMOGRAM  09/15/2013  ? INFLUENZA VACCINE  01/05/2022  ? DEXA SCAN  Completed  ? ? ?  08/25/2015  ? 11:26 AM 12/28/2016  ?  9:35 AM 02/15/2017  ? 10:04 AM 04/21/2018  ?  1:12 PM 04/27/2019  ? 10:06 AM  ?Fall Risk  ?Falls in the past year? No No No 0 0  ? ?Functional Status Survey: ?  ? ?Vitals:  ? 09/09/21 1347  ?BP: 130/73  ?Pulse: 65  ?Resp: 18  ?Temp: 97.7 ?F (36.5 ?C)  ?SpO2: 95%  ?Weight: 174 lb (78.9 kg)  ?Height: 5'  (1.524 m)  ? ?Body mass index is 33.98 kg/m?[name] ?Physical Exam ?Vitals reviewed.  ?Constitutional:   ?   General: She is not in acute distress. ?HENT:  ?   Head: Normocephalic.  ?Eyes:  ?   General:     ?   Right eye: No discharge.     ?   Left eye: No discharge.  ?Neck:  ?   Vascular: No carotid bruit.  ?Cardiovascular:  ?   Rate and Rhythm: Normal rate and regular rhythm.  ?   Pulses: Normal pulses.  ?   Heart sounds: Normal heart sounds. No murmur heard. ?Pulmonary:  ?   Effort: Pulmonary effort is normal. No respiratory distress.  ?   Breath sounds: Normal breath sounds. No wheezing.  ?Abdominal:  ?   General: Bowel sounds are normal. There is no distension.  ?   Palpations: Abdomen is soft.  ?   Tenderness: There is no abdominal tenderness.  ?Musculoskeletal:  ?   Cervical back: Neck supple.  ?  Right lower leg: Edema present.  ?   Left lower leg: Edema present.  ?   Comments: Non-pitting  ?Lymphadenopathy:  ?   Cervical: No cervical adenopathy.  ?Skin: ?   General: Skin is warm and dry.  ?   Capillary Refill: Capillary refill takes less than 2 seconds.  ?Neurological:  ?   General: No focal deficit present.  ?   Mental Status: She is alert. Mental status is at baseline.  ?   Motor: Weakness present.  ?   Gait: Gait abnormal.  ?   Comments: Walker ?  ?Psychiatric:     ?   Mood and Affect: Mood normal.     ?   Behavior: Behavior normal.     ?   Cognition and Memory: Memory is impaired.  ?   Comments: Very pleasant, follows commands  ? ? ?Labs reviewed: ?Recent Labs  ?  01/06/21 ?0000 01/15/21 ?0000 09/03/21 ?0000  ?NA 139 140 134*  ?K 3.6 4.4 3.3*  ?CL 98* 103 95*  ?CO2 33* 29* 29*  ?BUN 26* 15 17  ?CREATININE 1.2* 1.1 1.9*  ?CALCIUM 8.6* 8.8 9.3  ? ?Recent Labs  ?  09/03/21 ?0000  ?AST 134*  ?ALT 108*  ?ALKPHOS 830*  ?ALBUMIN 3.9  ? ?Recent Labs  ?  09/25/20 ?0000 12/29/20 ?0000 09/03/21 ?0000  ?WBC 7.3 10.3 12.7  ?NEUTROABS  --  6,149.00 7,836.00  ?HGB 11.3* 12.7 15.3  ?HCT 37 38 46  ?PLT 275 212 11*  ? ?Lab  Results  ?Component Value Date  ? TSH 2.55 04/03/2020  ? ?Lab Results  ?Component Value Date  ? HGBA1C 5.8 07/11/2017  ? ?Lab Results  ?Component Value Date  ? CHOL 136 06/18/2021  ? HDL 34 (A) 06/18/2021

## 2021-09-09 NOTE — Addendum Note (Signed)
Addended byWindell Moulding E on: 09/09/2021 05:21 PM ? ? Modules accepted: Orders ? ?

## 2021-09-10 ENCOUNTER — Ambulatory Visit
Admission: RE | Admit: 2021-09-10 | Discharge: 2021-09-10 | Disposition: A | Discharge status: Home / Self Care | Payer: Medicare Other | Source: Ambulatory Visit | Attending: Orthopedic Surgery | Admitting: Orthopedic Surgery

## 2021-09-10 DIAGNOSIS — K838 Other specified diseases of biliary tract: Secondary | ICD-10-CM | POA: Diagnosis not present

## 2021-09-10 DIAGNOSIS — D72829 Elevated white blood cell count, unspecified: Secondary | ICD-10-CM

## 2021-09-10 DIAGNOSIS — R112 Nausea with vomiting, unspecified: Secondary | ICD-10-CM | POA: Diagnosis not present

## 2021-09-10 DIAGNOSIS — Z9049 Acquired absence of other specified parts of digestive tract: Secondary | ICD-10-CM | POA: Diagnosis not present

## 2021-09-10 DIAGNOSIS — R748 Abnormal levels of other serum enzymes: Secondary | ICD-10-CM | POA: Diagnosis not present

## 2021-09-12 ENCOUNTER — Other Ambulatory Visit: Payer: Self-pay

## 2021-09-12 ENCOUNTER — Emergency Department (HOSPITAL_COMMUNITY): Payer: Medicare Other

## 2021-09-12 ENCOUNTER — Inpatient Hospital Stay (HOSPITAL_COMMUNITY)
Admission: EM | Admit: 2021-09-12 | Discharge: 2021-09-16 | DRG: 871 | Disposition: A | Discharge status: Home Health | Payer: Medicare Other | Source: Skilled Nursing Facility | Attending: Family Medicine | Admitting: Family Medicine

## 2021-09-12 ENCOUNTER — Encounter (HOSPITAL_COMMUNITY): Payer: Self-pay

## 2021-09-12 DIAGNOSIS — G9341 Metabolic encephalopathy: Secondary | ICD-10-CM | POA: Diagnosis not present

## 2021-09-12 DIAGNOSIS — S0990XA Unspecified injury of head, initial encounter: Secondary | ICD-10-CM | POA: Diagnosis not present

## 2021-09-12 DIAGNOSIS — K219 Gastro-esophageal reflux disease without esophagitis: Secondary | ICD-10-CM | POA: Diagnosis not present

## 2021-09-12 DIAGNOSIS — Z66 Do not resuscitate: Secondary | ICD-10-CM | POA: Diagnosis present

## 2021-09-12 DIAGNOSIS — Z885 Allergy status to narcotic agent status: Secondary | ICD-10-CM | POA: Diagnosis not present

## 2021-09-12 DIAGNOSIS — F05 Delirium due to known physiological condition: Secondary | ICD-10-CM | POA: Diagnosis not present

## 2021-09-12 DIAGNOSIS — Z90710 Acquired absence of both cervix and uterus: Secondary | ICD-10-CM | POA: Diagnosis not present

## 2021-09-12 DIAGNOSIS — B9620 Unspecified Escherichia coli [E. coli] as the cause of diseases classified elsewhere: Secondary | ICD-10-CM

## 2021-09-12 DIAGNOSIS — I129 Hypertensive chronic kidney disease with stage 1 through stage 4 chronic kidney disease, or unspecified chronic kidney disease: Secondary | ICD-10-CM | POA: Diagnosis not present

## 2021-09-12 DIAGNOSIS — I10 Essential (primary) hypertension: Secondary | ICD-10-CM | POA: Diagnosis present

## 2021-09-12 DIAGNOSIS — K8031 Calculus of bile duct with cholangitis, unspecified, with obstruction: Secondary | ICD-10-CM | POA: Diagnosis not present

## 2021-09-12 DIAGNOSIS — K429 Umbilical hernia without obstruction or gangrene: Secondary | ICD-10-CM | POA: Diagnosis not present

## 2021-09-12 DIAGNOSIS — R531 Weakness: Secondary | ICD-10-CM | POA: Diagnosis not present

## 2021-09-12 DIAGNOSIS — R7881 Bacteremia: Secondary | ICD-10-CM

## 2021-09-12 DIAGNOSIS — R6520 Severe sepsis without septic shock: Secondary | ICD-10-CM | POA: Diagnosis present

## 2021-09-12 DIAGNOSIS — Z8249 Family history of ischemic heart disease and other diseases of the circulatory system: Secondary | ICD-10-CM

## 2021-09-12 DIAGNOSIS — F03A0 Unspecified dementia, mild, without behavioral disturbance, psychotic disturbance, mood disturbance, and anxiety: Secondary | ICD-10-CM | POA: Diagnosis not present

## 2021-09-12 DIAGNOSIS — G8929 Other chronic pain: Secondary | ICD-10-CM | POA: Diagnosis present

## 2021-09-12 DIAGNOSIS — E876 Hypokalemia: Secondary | ICD-10-CM | POA: Diagnosis not present

## 2021-09-12 DIAGNOSIS — H9190 Unspecified hearing loss, unspecified ear: Secondary | ICD-10-CM | POA: Diagnosis present

## 2021-09-12 DIAGNOSIS — I6523 Occlusion and stenosis of bilateral carotid arteries: Secondary | ICD-10-CM | POA: Diagnosis not present

## 2021-09-12 DIAGNOSIS — M4802 Spinal stenosis, cervical region: Secondary | ICD-10-CM | POA: Diagnosis not present

## 2021-09-12 DIAGNOSIS — Z79899 Other long term (current) drug therapy: Secondary | ICD-10-CM

## 2021-09-12 DIAGNOSIS — E669 Obesity, unspecified: Secondary | ICD-10-CM | POA: Diagnosis present

## 2021-09-12 DIAGNOSIS — E785 Hyperlipidemia, unspecified: Secondary | ICD-10-CM | POA: Diagnosis present

## 2021-09-12 DIAGNOSIS — K838 Other specified diseases of biliary tract: Secondary | ICD-10-CM | POA: Diagnosis not present

## 2021-09-12 DIAGNOSIS — G928 Other toxic encephalopathy: Secondary | ICD-10-CM | POA: Diagnosis not present

## 2021-09-12 DIAGNOSIS — D72829 Elevated white blood cell count, unspecified: Secondary | ICD-10-CM | POA: Diagnosis not present

## 2021-09-12 DIAGNOSIS — A419 Sepsis, unspecified organism: Secondary | ICD-10-CM | POA: Diagnosis present

## 2021-09-12 DIAGNOSIS — N184 Chronic kidney disease, stage 4 (severe): Secondary | ICD-10-CM | POA: Diagnosis not present

## 2021-09-12 DIAGNOSIS — Z9181 History of falling: Secondary | ICD-10-CM | POA: Diagnosis not present

## 2021-09-12 DIAGNOSIS — R29898 Other symptoms and signs involving the musculoskeletal system: Secondary | ICD-10-CM | POA: Diagnosis not present

## 2021-09-12 DIAGNOSIS — R1111 Vomiting without nausea: Secondary | ICD-10-CM | POA: Diagnosis not present

## 2021-09-12 DIAGNOSIS — N170 Acute kidney failure with tubular necrosis: Secondary | ICD-10-CM | POA: Diagnosis present

## 2021-09-12 DIAGNOSIS — S199XXA Unspecified injury of neck, initial encounter: Secondary | ICD-10-CM | POA: Diagnosis not present

## 2021-09-12 DIAGNOSIS — Z91041 Radiographic dye allergy status: Secondary | ICD-10-CM

## 2021-09-12 DIAGNOSIS — N179 Acute kidney failure, unspecified: Secondary | ICD-10-CM | POA: Diagnosis present

## 2021-09-12 DIAGNOSIS — Z9049 Acquired absence of other specified parts of digestive tract: Secondary | ICD-10-CM | POA: Diagnosis not present

## 2021-09-12 DIAGNOSIS — E872 Acidosis, unspecified: Secondary | ICD-10-CM | POA: Diagnosis present

## 2021-09-12 DIAGNOSIS — W19XXXA Unspecified fall, initial encounter: Secondary | ICD-10-CM | POA: Diagnosis present

## 2021-09-12 DIAGNOSIS — R2689 Other abnormalities of gait and mobility: Secondary | ICD-10-CM | POA: Diagnosis not present

## 2021-09-12 DIAGNOSIS — Z6830 Body mass index (BMI) 30.0-30.9, adult: Secondary | ICD-10-CM | POA: Diagnosis not present

## 2021-09-12 DIAGNOSIS — I959 Hypotension, unspecified: Secondary | ICD-10-CM | POA: Diagnosis not present

## 2021-09-12 DIAGNOSIS — K8050 Calculus of bile duct without cholangitis or cholecystitis without obstruction: Secondary | ICD-10-CM | POA: Diagnosis not present

## 2021-09-12 DIAGNOSIS — K8309 Other cholangitis: Secondary | ICD-10-CM | POA: Diagnosis not present

## 2021-09-12 DIAGNOSIS — Z825 Family history of asthma and other chronic lower respiratory diseases: Secondary | ICD-10-CM

## 2021-09-12 DIAGNOSIS — R509 Fever, unspecified: Secondary | ICD-10-CM | POA: Diagnosis not present

## 2021-09-12 DIAGNOSIS — Z823 Family history of stroke: Secondary | ICD-10-CM

## 2021-09-12 DIAGNOSIS — R296 Repeated falls: Secondary | ICD-10-CM | POA: Diagnosis not present

## 2021-09-12 DIAGNOSIS — A4151 Sepsis due to Escherichia coli [E. coli]: Secondary | ICD-10-CM | POA: Diagnosis not present

## 2021-09-12 DIAGNOSIS — K449 Diaphragmatic hernia without obstruction or gangrene: Secondary | ICD-10-CM | POA: Diagnosis not present

## 2021-09-12 DIAGNOSIS — R41841 Cognitive communication deficit: Secondary | ICD-10-CM | POA: Diagnosis not present

## 2021-09-12 DIAGNOSIS — R7989 Other specified abnormal findings of blood chemistry: Secondary | ICD-10-CM | POA: Diagnosis present

## 2021-09-12 DIAGNOSIS — M6281 Muscle weakness (generalized): Secondary | ICD-10-CM | POA: Diagnosis not present

## 2021-09-12 DIAGNOSIS — R112 Nausea with vomiting, unspecified: Secondary | ICD-10-CM | POA: Diagnosis not present

## 2021-09-12 LAB — DIFFERENTIAL
Abs Immature Granulocytes: 0.24 10*3/uL — ABNORMAL HIGH (ref 0.00–0.07)
Basophils Absolute: 0.1 10*3/uL (ref 0.0–0.1)
Basophils Relative: 0 %
Eosinophils Absolute: 0 10*3/uL (ref 0.0–0.5)
Eosinophils Relative: 0 %
Immature Granulocytes: 1 %
Lymphocytes Relative: 4 %
Lymphs Abs: 1.1 10*3/uL (ref 0.7–4.0)
Monocytes Absolute: 1.4 10*3/uL — ABNORMAL HIGH (ref 0.1–1.0)
Monocytes Relative: 5 %
Neutro Abs: 24.8 10*3/uL — ABNORMAL HIGH (ref 1.7–7.7)
Neutrophils Relative %: 90 %

## 2021-09-12 LAB — COMPREHENSIVE METABOLIC PANEL
ALT: 76 U/L — ABNORMAL HIGH (ref 0–44)
AST: 148 U/L — ABNORMAL HIGH (ref 15–41)
Albumin: 3.3 g/dL — ABNORMAL LOW (ref 3.5–5.0)
Alkaline Phosphatase: 1048 U/L — ABNORMAL HIGH (ref 38–126)
Anion gap: 14 (ref 5–15)
BUN: 24 mg/dL — ABNORMAL HIGH (ref 8–23)
CO2: 23 mmol/L (ref 22–32)
Calcium: 9.2 mg/dL (ref 8.9–10.3)
Chloride: 101 mmol/L (ref 98–111)
Creatinine, Ser: 2.2 mg/dL — ABNORMAL HIGH (ref 0.44–1.00)
GFR, Estimated: 21 mL/min — ABNORMAL LOW (ref >60)
Glucose, Bld: 106 mg/dL — ABNORMAL HIGH (ref 70–99)
Potassium: 3.2 mmol/L — ABNORMAL LOW (ref 3.5–5.1)
Sodium: 138 mmol/L (ref 135–145)
Total Bilirubin: 5.4 mg/dL — ABNORMAL HIGH (ref 0.3–1.2)
Total Protein: 6.9 g/dL (ref 6.5–8.1)

## 2021-09-12 LAB — LACTIC ACID, PLASMA: Lactic Acid, Venous: 1.7 mmol/L (ref 0.5–1.9)

## 2021-09-12 LAB — CBC
HCT: 43.9 % (ref 36.0–46.0)
Hemoglobin: 14.8 g/dL (ref 12.0–15.0)
MCH: 32.7 pg (ref 26.0–34.0)
MCHC: 33.7 g/dL (ref 30.0–36.0)
MCV: 97.1 fL (ref 80.0–100.0)
Platelets: 251 10*3/uL (ref 150–400)
RBC: 4.52 MIL/uL (ref 3.87–5.11)
RDW: 14.2 % (ref 11.5–15.5)
WBC: 27.6 10*3/uL — ABNORMAL HIGH (ref 4.0–10.5)
nRBC: 0 % (ref 0.0–0.2)

## 2021-09-12 LAB — LIPASE, BLOOD: Lipase: 36 U/L (ref 11–51)

## 2021-09-12 MED ORDER — SODIUM CHLORIDE 0.9 % IV SOLN
INTRAVENOUS | Status: DC
Start: 2021-09-12 — End: 2021-09-15

## 2021-09-12 MED ORDER — ONDANSETRON HCL 4 MG/2ML IJ SOLN
4.0000 mg | Freq: Once | INTRAMUSCULAR | Status: AC
  Administered 2021-09-12: 4 mg via INTRAVENOUS
  Filled 2021-09-12: qty 2

## 2021-09-12 MED ORDER — SODIUM CHLORIDE 0.9 % IV BOLUS
500.0000 mL | Freq: Once | INTRAVENOUS | Status: AC
  Administered 2021-09-12: 500 mL via INTRAVENOUS

## 2021-09-12 NOTE — ED Provider Notes (Signed)
?  Provider Note ?MRN:  [id number]  ?Arrival date & time: 09/13/21    ?ED Course and Medical Decision Making  ?Assumed care from Dr. Rogene Houston at shift change. ? ?Altered mental status, vomiting, jaundice, awaiting CT imaging.  Family interested in goals of care discussion after CT imaging. ? ?CT imaging reveals choledocholithiasis.  Consulting GI for recommendations in the morning, admitted to medicine for further care.  Given the leukocytosis cholangitis is considered however patient is without fever, resting comfortably, nontender abdomen.  Medicine to consider empiric antibiotics. ? ?Procedures ? ?Final Clinical Impressions(s) / ED Diagnoses  ? ?  ICD-10-CM   ?1. Hypokalemia  E87.6   ?  ?2. Leukocytosis, unspecified type  D72.829   ?  ?3. Fall, initial encounter  W19.Merril Abbe   ?  ?4. Abnormal LFTs (liver function tests)  R79.89   ?  ?5. CKD (chronic kidney disease) stage 4, GFR 15-29 ml/min (HCC)  N18.4   ?  ?6. Choledocholithiasis  K80.50   ?  ?  ?ED Discharge Orders   ? ? None  ? ?  ?  ?Discharge Instructions   ?None ?  ? ? ?Barth Kirks. Sedonia Small, MD ?[facility] Emergency Medicine ?[place] [email or web address] ? ?  ?Maudie Flakes, MD ?09/13/21 [phone] ? ?

## 2021-09-12 NOTE — ED Provider Notes (Addendum)
[facility]-EMERGENCY DEPT Provider Note   CSN: [id number] Arrival date & time: 09/12/21  2011     History  Chief Complaint  Patient presents with   Fall    Deanna Shields is a 88 y.o. female.  Patient is a DNR.  Patient brought in from friend's home.  Patient had an unwitnessed fall at the facility was found facedown with vomit around her.  Seen yesterday by PCP for vomiting.  Family member states that she had nausea but no vomiting.  C-collar placed.  Patient not on blood thinners.  Has a history of mild dementia but normally walks with a walker.  She is definitely not as alert as usual.  Patient given Zofran prior to arrival.  Vital signs temp 97.7.  But the family states that at the nursing home yesterday she had a temp of 100.2.  Blood pressure is 117/55 respirations 20 oxygen sats 100% on room air.  They also did an ultrasound of her abdomen with results on Friday that showed a dilated bile duct.  Patient's had her gallbladder removed.  Patient has an allergy to contrast media.  Past medical history significant for hypertension esophageal stricture esophageal reflux disease syncopal episodes gallstones with gallbladder removal hard of hearing arthritis chronic shoulder pain.  Past surgical history significant for vaginal hysterectomy left heart catheterization in 2016 ruptured appendix in 1947 laparoscopic cholecystectomy in 2016 upper endoscopy with esophageal dilatation 2-3 times.  Patient never smoked.  Patient last seen in the emergency department June 26, 2017 for viral gastritis.      Home Medications Prior to Admission medications   Medication Sig Start Date End Date Taking? Authorizing Provider  alum & mag hydroxide-simeth (MAALOX PLUS) 400-400-40 MG/5ML suspension Take 10 mLs by mouth every 6 (six) hours as needed for indigestion or heartburn.    [provider]  amLODipine (NORVASC) 2.5 MG tablet Take 1 tablet (2.5 mg total) by mouth daily.  04/03/20   Donato Schultz, DO  benzonatate (TESSALON) 100 MG capsule Take 1 capsule by mouth 3 (three) times daily. 12/24/20   [provider]  Calcium Carb-Cholecalciferol (HM CALCIUM-VITAMIN D) 600-800 MG-UNIT TABS Take 1 tablet by mouth 2 (two) times daily.    [provider]  Cholecalciferol (VITAMIN D3) 50 MCG (2000 UT) TABS Take 1 capsule by mouth daily.    [provider]  ferrous sulfate 325 (65 FE) MG tablet Take 325 mg by mouth. Monday, Wednesday and Fridays    [provider]  omeprazole (PRILOSEC) 20 MG capsule Take 20 mg by mouth daily.    [provider]  rosuvastatin (CRESTOR) 20 MG tablet Take 1 tablet (20 mg total) by mouth daily. 04/03/20   Donato Schultz, DO  vitamin B-12 (CYANOCOBALAMIN) 1000 MCG tablet Take 1,000 mcg by mouth daily.    [provider]      Allergies    Contrast media [iodinated contrast media] and Codeine    Review of Systems   Review of Systems  Unable to perform ROS: Dementia  HENT:  Positive for hearing loss.    Physical Exam Updated Vital Signs BP (!) 126/52   Pulse 90   Temp 97.7 F (36.5 C) (Oral)   Resp 20   Ht 1.676 m (5\' 6" )   Wt 85.7 kg   SpO2 100%   BMI 30.51 kg/m  Physical Exam Vitals and nursing note reviewed.  Constitutional:      General: She is not  in acute distress.    Appearance: Normal appearance. She is well-developed. She is not ill-appearing.  HENT:     Head: Normocephalic and atraumatic.     Mouth/Throat:     Mouth: Mucous membranes are moist.  Eyes:     Extraocular Movements: Extraocular movements intact.     Conjunctiva/sclera: Conjunctivae normal.     Pupils: Pupils are equal, round, and reactive to light.  Cardiovascular:     Rate and Rhythm: Normal rate and regular rhythm.     Heart sounds: No murmur heard. Pulmonary:     Effort: Pulmonary effort is normal. No respiratory distress.     Breath sounds: Normal breath sounds.  Abdominal:      General: There is no distension.     Palpations: Abdomen is soft.     Tenderness: There is no abdominal tenderness. There is no guarding.  Musculoskeletal:        General: No swelling.     Cervical back: Normal range of motion and neck supple.  Skin:    General: Skin is warm and dry.     Capillary Refill: Capillary refill takes less than 2 seconds.  Neurological:     General: No focal deficit present.     Mental Status: She is alert.     Cranial Nerves: No cranial nerve deficit.     Sensory: No sensory deficit.     Motor: No weakness.     Comments: Patient is hard of hearing but will wake up will follow commands denies any pain.  Able to move all extremities  Psychiatric:        Mood and Affect: Mood normal.    ED Results / Procedures / Treatments   Labs (all labs ordered are listed, but only abnormal results are displayed) Labs Reviewed  COMPREHENSIVE METABOLIC PANEL - Abnormal; Notable for the following components:      Result Value   Potassium 3.2 (*)    Glucose, Bld 106 (*)    BUN 24 (*)    Creatinine, Ser 2.20 (*)    Albumin 3.3 (*)    AST 148 (*)    ALT 76 (*)    Alkaline Phosphatase 1,048 (*)    Total Bilirubin 5.4 (*)    GFR, Estimated 21 (*)    All other components within normal limits  CBC - Abnormal; Notable for the following components:   WBC 27.6 (*)    All other components within normal limits  DIFFERENTIAL - Abnormal; Notable for the following components:   Neutro Abs 24.8 (*)    Monocytes Absolute 1.4 (*)    Abs Immature Granulocytes 0.24 (*)    All other components within normal limits  CULTURE, BLOOD (ROUTINE X 2)  CULTURE, BLOOD (ROUTINE X 2)  LIPASE, BLOOD  URINALYSIS, ROUTINE W REFLEX MICROSCOPIC  LACTIC ACID, PLASMA    EKG None  Radiology No results found.  Procedures Procedures    Medications Ordered in ED Medications  0.9 %  sodium chloride infusion (has no administration in time range)  sodium chloride 0.9 % bolus 500 mL (has  no administration in time range)  ondansetron (ZOFRAN) injection 4 mg (has no administration in time range)    ED Course/ Medical Decision Making/ A&P                           Medical Decision Making Amount and/or Complexity of Data Reviewed Labs: ordered. Radiology: ordered.  Risk Prescription drug  management.   Lipase is normal at 36.  Comprehensive metabolic panel patient's potassium 3.2 glucose 106 sodium normal CO2 is normal at 23 BUN 24 creatinine is 2.2.  For GFR 21.  Patient's total bili is 5.4 alk phos is markedly elevated at 1000 and AST and ALT are elevated as well.  So liver function tests are significantly abnormal.  CT scan of the abdomen will be important for further evaluation.  However the markedly elevated bilirubin and alk phos probably goes along with the enlarged common bile duct noted on the ultrasound from Friday.  White blood cell count 27,000 hemoglobin 14.8.  Platelets normal at 251.  The hypokalemia is mild.  Does not need any IV potassium at this time.  Renal function is significant abnormal.  The patient is known to have a history of chronic kidney disease stage IIIb.  Based on today's GFR patient could be stage IV.  But it does appear that the at the end of March her creatinine was 1.9.  So this may not be too far off baseline.  Chest x-ray without any acute abnormalities no evidence of pneumonia or fluid accumulation.  In addition due to the elevated bilirubin we will get an ammonia level.  This could perhaps explain patient's change in mental status.  Patient also has a chest x-ray patient will get CT abdomen pelvis.  Patient will get CT head and neck due to the fall and she has a c-collar in place.  No evidence of any trauma.  And patient has urinalysis pending also had ordered blood cultures and lactic acid.  Patient currently not meeting sepsis parameters.  And patient is a DNR.  Most likely patient will need admission most likely has a source of infection  somewhere for those abnormal liver function test or could have abdominal mass and but patient does have a marked leukocytosis as well.  Once we have all the scan results.  Patient's daughter is present.  She is power of attorney for the patient.  The medical power of attorney.  She wants to be informed of all the results.  Since patient is a DNR and wants to be actively involved in any treatment decisions going forward.   Final Clinical Impression(s) / ED Diagnoses Final diagnoses:  Hypokalemia  Leukocytosis, unspecified type  Fall, initial encounter  Abnormal LFTs (liver function tests)  CKD (chronic kidney disease) stage 4, GFR 15-29 ml/min [facility])    Rx / DC Orders ED Discharge Orders     None         Vanetta Mulders, MD 09/12/21 2215    Vanetta Mulders, MD 09/12/21 3295    Vanetta Mulders, MD 09/12/21 2225    Vanetta Mulders, MD 09/12/21 2250    Vanetta Mulders, MD 09/12/21 2252

## 2021-09-12 NOTE — ED Triage Notes (Addendum)
BIB [facility] after unwitnessed fall at facility. Was found face down with vomit around her. Seen 4/5 by PCP for vomiting. C-collar in place. No blood thinners. Hx of dementia. 4 mg zofran given PTA.  ?

## 2021-09-13 ENCOUNTER — Inpatient Hospital Stay (HOSPITAL_COMMUNITY): Payer: Medicare Other | Admitting: Certified Registered Nurse Anesthetist

## 2021-09-13 ENCOUNTER — Encounter (HOSPITAL_COMMUNITY): Payer: Self-pay | Admitting: Internal Medicine

## 2021-09-13 ENCOUNTER — Inpatient Hospital Stay (HOSPITAL_COMMUNITY): Payer: Medicare Other

## 2021-09-13 ENCOUNTER — Encounter (HOSPITAL_COMMUNITY)
Admission: EM | Disposition: A | Discharge status: Home Health | Payer: Self-pay | Source: Skilled Nursing Facility | Attending: Internal Medicine

## 2021-09-13 DIAGNOSIS — K219 Gastro-esophageal reflux disease without esophagitis: Secondary | ICD-10-CM

## 2021-09-13 DIAGNOSIS — Z885 Allergy status to narcotic agent status: Secondary | ICD-10-CM | POA: Diagnosis not present

## 2021-09-13 DIAGNOSIS — B9620 Unspecified Escherichia coli [E. coli] as the cause of diseases classified elsewhere: Secondary | ICD-10-CM | POA: Diagnosis not present

## 2021-09-13 DIAGNOSIS — Z90710 Acquired absence of both cervix and uterus: Secondary | ICD-10-CM | POA: Diagnosis not present

## 2021-09-13 DIAGNOSIS — E785 Hyperlipidemia, unspecified: Secondary | ICD-10-CM | POA: Diagnosis present

## 2021-09-13 DIAGNOSIS — W19XXXA Unspecified fall, initial encounter: Secondary | ICD-10-CM | POA: Diagnosis present

## 2021-09-13 DIAGNOSIS — K8031 Calculus of bile duct with cholangitis, unspecified, with obstruction: Secondary | ICD-10-CM | POA: Diagnosis present

## 2021-09-13 DIAGNOSIS — M6281 Muscle weakness (generalized): Secondary | ICD-10-CM | POA: Diagnosis not present

## 2021-09-13 DIAGNOSIS — I129 Hypertensive chronic kidney disease with stage 1 through stage 4 chronic kidney disease, or unspecified chronic kidney disease: Secondary | ICD-10-CM | POA: Diagnosis present

## 2021-09-13 DIAGNOSIS — K8050 Calculus of bile duct without cholangitis or cholecystitis without obstruction: Secondary | ICD-10-CM

## 2021-09-13 DIAGNOSIS — G928 Other toxic encephalopathy: Secondary | ICD-10-CM | POA: Diagnosis present

## 2021-09-13 DIAGNOSIS — R29898 Other symptoms and signs involving the musculoskeletal system: Secondary | ICD-10-CM | POA: Diagnosis not present

## 2021-09-13 DIAGNOSIS — R296 Repeated falls: Secondary | ICD-10-CM | POA: Diagnosis not present

## 2021-09-13 DIAGNOSIS — E872 Acidosis, unspecified: Secondary | ICD-10-CM | POA: Diagnosis present

## 2021-09-13 DIAGNOSIS — Z66 Do not resuscitate: Secondary | ICD-10-CM | POA: Diagnosis present

## 2021-09-13 DIAGNOSIS — R7989 Other specified abnormal findings of blood chemistry: Secondary | ICD-10-CM | POA: Diagnosis present

## 2021-09-13 DIAGNOSIS — F03A0 Unspecified dementia, mild, without behavioral disturbance, psychotic disturbance, mood disturbance, and anxiety: Secondary | ICD-10-CM | POA: Diagnosis present

## 2021-09-13 DIAGNOSIS — Z91041 Radiographic dye allergy status: Secondary | ICD-10-CM | POA: Diagnosis not present

## 2021-09-13 DIAGNOSIS — K8309 Other cholangitis: Secondary | ICD-10-CM | POA: Diagnosis not present

## 2021-09-13 DIAGNOSIS — Z6830 Body mass index (BMI) 30.0-30.9, adult: Secondary | ICD-10-CM | POA: Diagnosis not present

## 2021-09-13 DIAGNOSIS — E876 Hypokalemia: Secondary | ICD-10-CM | POA: Diagnosis present

## 2021-09-13 DIAGNOSIS — N179 Acute kidney failure, unspecified: Secondary | ICD-10-CM | POA: Diagnosis not present

## 2021-09-13 DIAGNOSIS — N184 Chronic kidney disease, stage 4 (severe): Secondary | ICD-10-CM | POA: Diagnosis present

## 2021-09-13 DIAGNOSIS — R2689 Other abnormalities of gait and mobility: Secondary | ICD-10-CM | POA: Diagnosis not present

## 2021-09-13 DIAGNOSIS — I10 Essential (primary) hypertension: Secondary | ICD-10-CM | POA: Diagnosis not present

## 2021-09-13 DIAGNOSIS — E669 Obesity, unspecified: Secondary | ICD-10-CM | POA: Diagnosis present

## 2021-09-13 DIAGNOSIS — G8929 Other chronic pain: Secondary | ICD-10-CM | POA: Diagnosis present

## 2021-09-13 DIAGNOSIS — A419 Sepsis, unspecified organism: Secondary | ICD-10-CM

## 2021-09-13 DIAGNOSIS — Z79899 Other long term (current) drug therapy: Secondary | ICD-10-CM | POA: Diagnosis not present

## 2021-09-13 DIAGNOSIS — R41841 Cognitive communication deficit: Secondary | ICD-10-CM | POA: Diagnosis not present

## 2021-09-13 DIAGNOSIS — R6520 Severe sepsis without septic shock: Secondary | ICD-10-CM

## 2021-09-13 DIAGNOSIS — A4151 Sepsis due to Escherichia coli [E. coli]: Secondary | ICD-10-CM | POA: Diagnosis present

## 2021-09-13 DIAGNOSIS — G9341 Metabolic encephalopathy: Secondary | ICD-10-CM | POA: Diagnosis present

## 2021-09-13 DIAGNOSIS — Z9181 History of falling: Secondary | ICD-10-CM | POA: Diagnosis not present

## 2021-09-13 DIAGNOSIS — F05 Delirium due to known physiological condition: Secondary | ICD-10-CM | POA: Diagnosis not present

## 2021-09-13 DIAGNOSIS — Z9049 Acquired absence of other specified parts of digestive tract: Secondary | ICD-10-CM | POA: Diagnosis not present

## 2021-09-13 DIAGNOSIS — N170 Acute kidney failure with tubular necrosis: Secondary | ICD-10-CM | POA: Diagnosis present

## 2021-09-13 DIAGNOSIS — D72829 Elevated white blood cell count, unspecified: Secondary | ICD-10-CM | POA: Diagnosis not present

## 2021-09-13 DIAGNOSIS — H9190 Unspecified hearing loss, unspecified ear: Secondary | ICD-10-CM | POA: Diagnosis present

## 2021-09-13 DIAGNOSIS — R7881 Bacteremia: Secondary | ICD-10-CM | POA: Diagnosis not present

## 2021-09-13 HISTORY — PX: SPHINCTEROTOMY: SHX5544

## 2021-09-13 HISTORY — PX: REMOVAL OF STONES: SHX5545

## 2021-09-13 HISTORY — PX: ERCP: SHX5425

## 2021-09-13 LAB — COMPREHENSIVE METABOLIC PANEL
ALT: 78 U/L — ABNORMAL HIGH (ref 0–44)
AST: 157 U/L — ABNORMAL HIGH (ref 15–41)
Albumin: 2.7 g/dL — ABNORMAL LOW (ref 3.5–5.0)
Alkaline Phosphatase: 838 U/L — ABNORMAL HIGH (ref 38–126)
Anion gap: 11 (ref 5–15)
BUN: 24 mg/dL — ABNORMAL HIGH (ref 8–23)
CO2: 25 mmol/L (ref 22–32)
Calcium: 8.6 mg/dL — ABNORMAL LOW (ref 8.9–10.3)
Chloride: 103 mmol/L (ref 98–111)
Creatinine, Ser: 2.1 mg/dL — ABNORMAL HIGH (ref 0.44–1.00)
GFR, Estimated: 22 mL/min — ABNORMAL LOW (ref >60)
Glucose, Bld: 108 mg/dL — ABNORMAL HIGH (ref 70–99)
Potassium: 4.2 mmol/L (ref 3.5–5.1)
Sodium: 139 mmol/L (ref 135–145)
Total Bilirubin: 5.1 mg/dL — ABNORMAL HIGH (ref 0.3–1.2)
Total Protein: 5.6 g/dL — ABNORMAL LOW (ref 6.5–8.1)

## 2021-09-13 LAB — BLOOD CULTURE ID PANEL (REFLEXED) - BCID2

## 2021-09-13 LAB — CBC WITH DIFFERENTIAL/PLATELET
Abs Immature Granulocytes: 0.25 10*3/uL — ABNORMAL HIGH (ref 0.00–0.07)
Basophils Absolute: 0.1 10*3/uL (ref 0.0–0.1)
Basophils Relative: 0 %
Eosinophils Absolute: 0 10*3/uL (ref 0.0–0.5)
Eosinophils Relative: 0 %
HCT: 38.6 % (ref 36.0–46.0)
Hemoglobin: 13 g/dL (ref 12.0–15.0)
Immature Granulocytes: 1 %
Lymphocytes Relative: 8 %
Lymphs Abs: 2.2 10*3/uL (ref 0.7–4.0)
MCH: 33 pg (ref 26.0–34.0)
MCHC: 33.7 g/dL (ref 30.0–36.0)
MCV: 98 fL (ref 80.0–100.0)
Monocytes Absolute: 1.3 10*3/uL — ABNORMAL HIGH (ref 0.1–1.0)
Monocytes Relative: 5 %
Neutro Abs: 23.1 10*3/uL — ABNORMAL HIGH (ref 1.7–7.7)
Neutrophils Relative %: 86 %
Platelets: 227 10*3/uL (ref 150–400)
RBC: 3.94 MIL/uL (ref 3.87–5.11)
RDW: 14.5 % (ref 11.5–15.5)
WBC: 27 10*3/uL — ABNORMAL HIGH (ref 4.0–10.5)
nRBC: 0 % (ref 0.0–0.2)

## 2021-09-13 LAB — CK: Total CK: 50 U/L (ref 38–234)

## 2021-09-13 LAB — TSH: TSH: 1.313 u[IU]/mL (ref 0.350–4.500)

## 2021-09-13 LAB — BILIRUBIN, DIRECT: Bilirubin, Direct: 3.3 mg/dL — ABNORMAL HIGH (ref 0.0–0.2)

## 2021-09-13 LAB — MAGNESIUM
Magnesium: 2.5 mg/dL — ABNORMAL HIGH (ref 1.7–2.4)
Magnesium: 2.3 mg/dL (ref 1.7–2.4)

## 2021-09-13 LAB — AMMONIA: Ammonia: 46 umol/L — ABNORMAL HIGH (ref 9–35)

## 2021-09-13 LAB — PROTIME-INR
INR: 1.2 (ref 0.8–1.2)
Prothrombin Time: 14.8 seconds (ref 11.4–15.2)

## 2021-09-13 LAB — GAMMA GT: GGT: 1084 U/L — ABNORMAL HIGH (ref 7–50)

## 2021-09-13 SURGERY — ERCP, WITH INTERVENTION IF INDICATED
Anesthesia: General

## 2021-09-13 MED ORDER — METRONIDAZOLE 500 MG/100ML IV SOLN
500.0000 mg | Freq: Two times a day (BID) | INTRAVENOUS | Status: DC
  Administered 2021-09-13 – 2021-09-15 (×4): 500 mg via INTRAVENOUS
  Filled 2021-09-13 (×4): qty 100

## 2021-09-13 MED ORDER — SODIUM CHLORIDE 0.9 % IV SOLN
2.0000 g | INTRAVENOUS | Status: DC
  Administered 2021-09-13 – 2021-09-14 (×2): 2 g via INTRAVENOUS
  Filled 2021-09-13 (×2): qty 20

## 2021-09-13 MED ORDER — FENTANYL CITRATE (PF) 100 MCG/2ML IJ SOLN
INTRAMUSCULAR | Status: AC
Start: 2021-09-13 — End: ?
  Filled 2021-09-13: qty 2

## 2021-09-13 MED ORDER — SODIUM CHLORIDE 0.9 % IV SOLN: INTRAVENOUS | Status: DC

## 2021-09-13 MED ORDER — INDOMETHACIN 50 MG RE SUPP
RECTAL | Status: DC | PRN
  Administered 2021-09-13: 100 mg via RECTAL

## 2021-09-13 MED ORDER — DEXAMETHASONE SODIUM PHOSPHATE 10 MG/ML IJ SOLN
INTRAMUSCULAR | Status: DC | PRN
  Administered 2021-09-13: 4 mg via INTRAVENOUS

## 2021-09-13 MED ORDER — NALOXONE HCL 0.4 MG/ML IJ SOLN: 0.4000 mg | INTRAMUSCULAR | Status: DC | PRN

## 2021-09-13 MED ORDER — PHENYLEPHRINE HCL-NACL 20-0.9 MG/250ML-% IV SOLN
INTRAVENOUS | Status: DC | PRN
  Administered 2021-09-13: 20 ug/min via INTRAVENOUS

## 2021-09-13 MED ORDER — PHENYLEPHRINE 40 MCG/ML (10ML) SYRINGE FOR IV PUSH (FOR BLOOD PRESSURE SUPPORT)
PREFILLED_SYRINGE | INTRAVENOUS | Status: DC | PRN
Start: 2021-09-13 — End: 2021-09-13
  Administered 2021-09-13: 160 ug via INTRAVENOUS

## 2021-09-13 MED ORDER — ONDANSETRON HCL 4 MG/2ML IJ SOLN
4.0000 mg | Freq: Four times a day (QID) | INTRAMUSCULAR | Status: DC | PRN
  Administered 2021-09-13 (×2): 4 mg via INTRAVENOUS
  Filled 2021-09-13: qty 2

## 2021-09-13 MED ORDER — FENTANYL CITRATE PF 50 MCG/ML IJ SOSY
25.0000 ug | PREFILLED_SYRINGE | INTRAMUSCULAR | Status: DC | PRN
  Administered 2021-09-14: 25 ug via INTRAVENOUS
  Filled 2021-09-13: qty 1

## 2021-09-13 MED ORDER — ROCURONIUM BROMIDE 10 MG/ML (PF) SYRINGE
PREFILLED_SYRINGE | INTRAVENOUS | Status: DC | PRN
  Administered 2021-09-13: 50 mg via INTRAVENOUS

## 2021-09-13 MED ORDER — PHENYLEPHRINE HCL (PRESSORS) 10 MG/ML IV SOLN
INTRAVENOUS | Status: AC
  Filled 2021-09-13: qty 1

## 2021-09-13 MED ORDER — GLUCAGON HCL RDNA (DIAGNOSTIC) 1 MG IJ SOLR
INTRAMUSCULAR | Status: AC
  Filled 2021-09-13: qty 1

## 2021-09-13 MED ORDER — PIPERACILLIN-TAZOBACTAM IN DEX 2-0.25 GM/50ML IV SOLN
2.2500 g | Freq: Four times a day (QID) | INTRAVENOUS | Status: DC
  Administered 2021-09-13 (×2): 2.25 g via INTRAVENOUS
  Filled 2021-09-13 (×4): qty 50

## 2021-09-13 MED ORDER — PROPOFOL 10 MG/ML IV BOLUS
INTRAVENOUS | Status: DC | PRN
  Administered 2021-09-13: 150 mg via INTRAVENOUS

## 2021-09-13 MED ORDER — PROPOFOL 10 MG/ML IV BOLUS
INTRAVENOUS | Status: AC
  Filled 2021-09-13: qty 20

## 2021-09-13 MED ORDER — POTASSIUM CHLORIDE 10 MEQ/100ML IV SOLN
10.0000 meq | INTRAVENOUS | Status: AC
  Administered 2021-09-13 (×4): 10 meq via INTRAVENOUS
  Filled 2021-09-13 (×4): qty 100

## 2021-09-13 MED ORDER — SUGAMMADEX SODIUM 500 MG/5ML IV SOLN
INTRAVENOUS | Status: DC | PRN
  Administered 2021-09-13: 400 mg via INTRAVENOUS

## 2021-09-13 MED ORDER — GLUCAGON HCL RDNA (DIAGNOSTIC) 1 MG IJ SOLR
INTRAMUSCULAR | Status: DC | PRN
  Administered 2021-09-13: .5 mg via INTRAVENOUS

## 2021-09-13 MED ORDER — PIPERACILLIN-TAZOBACTAM 3.375 G IVPB 30 MIN: 3.3750 g | Freq: Once | INTRAVENOUS | Status: DC

## 2021-09-13 MED ORDER — INDOMETHACIN 50 MG RE SUPP
RECTAL | Status: AC
  Filled 2021-09-13: qty 2

## 2021-09-13 MED ORDER — ACETAMINOPHEN 650 MG RE SUPP: 650.0000 mg | Freq: Four times a day (QID) | RECTAL | Status: DC | PRN

## 2021-09-13 MED ORDER — PIPERACILLIN-TAZOBACTAM IN DEX 2-0.25 GM/50ML IV SOLN
2.2500 g | Freq: Four times a day (QID) | INTRAVENOUS | Status: DC
  Filled 2021-09-13 (×2): qty 50

## 2021-09-13 MED ORDER — POLYVINYL ALCOHOL 1.4 % OP SOLN
1.0000 [drp] | OPHTHALMIC | Status: DC | PRN
  Administered 2021-09-13: 1 [drp] via OPHTHALMIC
  Filled 2021-09-13: qty 15

## 2021-09-13 MED ORDER — SODIUM CHLORIDE 0.9 % IV SOLN
INTRAVENOUS | Status: DC | PRN
  Administered 2021-09-13: 40 mL

## 2021-09-13 MED ORDER — ACETAMINOPHEN 325 MG PO TABS
650.0000 mg | ORAL_TABLET | Freq: Four times a day (QID) | ORAL | Status: DC | PRN
  Filled 2021-09-13: qty 2

## 2021-09-13 MED ORDER — LIDOCAINE 2% (20 MG/ML) 5 ML SYRINGE
INTRAMUSCULAR | Status: DC | PRN
  Administered 2021-09-13: 100 mg via INTRAVENOUS

## 2021-09-13 MED ORDER — PANTOPRAZOLE SODIUM 40 MG IV SOLR
40.0000 mg | INTRAVENOUS | Status: DC
Start: 2021-09-13 — End: 2021-09-14
  Administered 2021-09-13 – 2021-09-14 (×2): 40 mg via INTRAVENOUS
  Filled 2021-09-13 (×2): qty 10

## 2021-09-13 NOTE — Assessment & Plan Note (Signed)
? ?#)  Severe sepsis: In the setting of suspected presenting choledocholithiasis, as further detailed above, SIRS criteria met via presenting leukocytosis, tachycardia, and tachypnea. Lactic acid level: Nonelevated at 1.7. Of note, given the associated presence of suspected end organ damage in the form of concominant presenting acute kidney injury as well as acute metabolic encephalopathy, criteria are met for pt's sepsis to be considered severe in nature. However, in the absence of lactic acid level that is greater than or equal to 4.0, and in the absence of any associated hypotension refractory to IVF's, there are no indications for administration of a 30 mL/kg IVF bolus at this time.  She has received 500 cc NS bolus followed by continuous NS at 100 cc/h.  Closely monitoring for ensuing evidence of ascending cholangitis, which does not appear to be overtly present at this time, as further detailed above.  ? ?Additional ED work-up/management notable for: Collection of blood cultures x2.  Will initiate Zosyn at this time, as above. ? ?No e/o additional infectious process at this time, including presenting chest x-ray that showed no evidence of acute cardiopulmonary process, including no evidence of infiltrate.  Urinalysis has been ordered, with result currently pending. ? ? ? ?Plan: CBC w/ diff and CMP in AM.  Follow for results of blood cx's x 2. Abx: Start Zosyn, as above.  Continuous IV fluids, as above.  Check urinalysis.  Gastroenterology consulted, with additional recommendations pending at this time, as above.  Check direct bilirubin level as well as GGT, as above.  Check INR.  NPO. ? ? ? ?

## 2021-09-13 NOTE — Assessment & Plan Note (Signed)
? ?#)   Essential Hypertension: documented h/o such, with outpatient antihypertensive regimen including amlodipine.  SBP's in the ED today: Normotensive.  In the setting of presenting severe sepsis and current n.p.o. status, will hold home amlodipine for now. ? ?Plan: Close monitoring of subsequent BP via routine VS. hold amlodipine for now. ? ?

## 2021-09-13 NOTE — Assessment & Plan Note (Signed)
? ?#)   Acute Kidney Injury: Relative to baseline serum creatinine range of 1.0-1.3 presenting serum creatinine found to be 2.2. Suspect that this is prerenal in nature d/t potential multifactorial contributions including dehydration in setting of recently diminished PO intake, N/V, as well as suspected additional contribution to intravascular depletion from severe sepsis itself d/t associated increase in capillary permeability.  In the setting of cute transaminitis as well as having high intensity statin use, will also check CPK level.  Urinalysis with microscopy ordered, with result currently pending. ? ? ?Plan: monitor strict I's & O's and daily weights. Attempt to avoid nephrotoxic agents. Refrain from NSAIDs. Repeat CMP in the morning. Check serum magnesium level.  Follow for results of urinalysis with microscopy.  Add-on random urine sodium and random urine creatinine.  Check CPK level, as above.  Continuous IV fluids, as above. ? ?

## 2021-09-13 NOTE — Progress Notes (Signed)
?PROGRESS NOTE ? ? ? ?Deanna Shields  NLZ:[id number] DOB: [birth year] DOA: 09/12/2021 ?PCP: [name], MD  ?Outpatient Specialists:  ? ? ? ?Brief Narrative:  ?Patient is a 88 year old Caucasian female past medical history significant for laparoscopic cholecystectomy in 2016, hypertension, chronic kidney disease stage III and hyperlipidemia.  She is a resident of an assisted living facility.  Patient was admitted with altered mental status, worsening renal function, choledocholithiasis and ascending cholangitis.  Patient has been seen by GI team.  Patient has undergone ERCP.  3 common bile duct stones were found and removed.  Dilatation of the common bile duct was also noted. ? ?Patient was seen today alongside patient's niece, who happens to be patient's healthcare power of attorney and a friend of the niece. ? ?Assessment & Plan: ?  ?Principal Problem: ?  Choledocholithiasis ?Active Problems: ?  Hypertension ?  HLD (hyperlipidemia) ?  Hypokalemia ?  Severe sepsis ([place]) ?  Acute metabolic encephalopathy ?  AKI (acute kidney injury) ([place]) ?  GERD (gastroesophageal reflux disease) ? ? ?Choledocholithiasis/ascending cholangitis/severe sepsis with organ failure: ?-Patient has undergone ERCP and stone removal. ?-Continue IV antibiotics, but changed to Rocephin and Flagyl. ?-Follow LFTs. ? ?Acute kidney injury on chronic kidney disease stage IIIa/B: ?-Acute kidney injury is likely secondary to combined ATN/prerenal. ?-Adequately hydrate patient. ?-Treat underlying ascending cholangitis. ?-Avoid nephrotoxins. ?-Continue to monitor renal function and electrolytes. ? ?Hypertension: ?-Continue to optimize.   ? ?Encephalopathy: ?-Combined toxic and metabolic. ?-Seems to have resolved significantly. ? ?DVT prophylaxis: Start subcutaneous heparin 5000 units twice daily. ?Code Status: DNR ?Family Communication: Niece/healthcare power of attorney ?Disposition Plan: Likely back to assisted living facility ? ? ?Consultants:   ?GI ? ?Procedures:  ?ERCP ? ?Antimicrobials:  ?IV Zosyn discontinued. ?IV Rocephin ?IV Flagyl ? ? ?Subjective: ?No new complaints ?No fever or chills ? ?Objective: ?Vitals:  ? 09/13/21 0222 09/13/21 0500 09/13/21 0604 09/13/21 1028  ?BP: (!) 134/56  (!) 129/51 (!) 132/50  ?Pulse: 75  73 (!) 53  ?Resp: '16  16 14  '$ ?Temp: 97.9 ?F (36.6 ?C)  98 ?F (36.7 ?C) 98.2 ?F (36.8 ?C)  ?TempSrc: Oral  Oral Oral  ?SpO2: 100%  94% 99%  ?Weight:  81.8 kg    ?Height:      ? ?No intake or output data in the 24 hours ending 09/13/21 1340 ?Filed Weights  ? 09/12/21 2019 09/13/21 0500  ?Weight: 85.7 kg 81.8 kg  ? ? ?Examination: ? ?General exam: Appears calm and comfortable ?HEENT: Pale. ?Neck: Supple.  No raised JVD. ?Respiratory system: Clear to auscultation. Respiratory effort normal. ?Cardiovascular system: S1 & S2  ?Gastrointestinal system: Abdomen is nondistended, soft and nontender. No organomegaly or masses felt. Normal bowel sounds heard. ?Central nervous system: Awake and alert.  Moves all extremities.   ?Extremities: No leg edema. ? ?Data Reviewed: I have personally reviewed following labs and imaging studies ? ?CBC: ?Recent Labs  ?Lab 09/12/21 ?2108 09/13/21 ?0443  ?WBC 27.6* 27.0*  ?NEUTROABS 24.8* 23.1*  ?HGB 14.8 13.0  ?HCT 43.9 38.6  ?MCV 97.1 98.0  ?PLT 251 227  ? ?Basic Metabolic Panel: ?Recent Labs  ?Lab 09/12/21 ?2108 09/13/21 ?0443  ?NA 138 139  ?K 3.2* 4.2  ?CL 101 103  ?CO2 23 25  ?GLUCOSE 106* 108*  ?BUN 24* 24*  ?CREATININE 2.20* 2.10*  ?CALCIUM 9.2 8.6*  ?MG 2.5* 2.3  ? ?GFR: ?Estimated Creatinine Clearance: 19.2 mL/min (A) (by C-G formula based on SCr of 2.1 mg/dL (H)). ?Liver Function  Tests: ?Recent Labs  ?Lab 09/12/21 ?2108 09/13/21 ?0443  ?AST 148* 157*  ?ALT 76* 78*  ?ALKPHOS 1,048* 838*  ?BILITOT 5.4* 5.1*  ?PROT 6.9 5.6*  ?ALBUMIN 3.3* 2.7*  ? ?Recent Labs  ?Lab 09/12/21 ?2108  ?LIPASE 36  ? ?Recent Labs  ?Lab 09/12/21 ?2258  ?AMMONIA 46*  ? ?Coagulation Profile: ?Recent Labs  ?Lab 09/13/21 ?0932  ?INR  1.2  ? ?Cardiac Enzymes: ?Recent Labs  ?Lab 09/12/21 ?2108  ?CKTOTAL 50  ? ?BNP (last 3 results) ?No results for input(s): PROBNP in the last 8760 hours. ?HbA1C: ?No results for input(s): HGBA1C in the last 72 hours. ?CBG: ?No results for input(s): GLUCAP in the last 168 hours. ?Lipid Profile: ?No results for input(s): CHOL, HDL, LDLCALC, TRIG, CHOLHDL, LDLDIRECT in the last 72 hours. ?Thyroid Function Tests: ?Recent Labs  ?  09/13/21 ?0443  ?TSH 1.313  ? ?Anemia Panel: ?No results for input(s): VITAMINB12, FOLATE, FERRITIN, TIBC, IRON, RETICCTPCT in the last 72 hours. ?Urine analysis: ?   ?Component Value Date/Time  ? COLORURINE YELLOW 06/27/2017 0047  ? APPEARANCEUR CLEAR 06/27/2017 0047  ? LABSPEC 1.009 06/27/2017 0047  ? PHURINE 5.0 06/27/2017 0047  ? GLUCOSEU NEGATIVE 06/27/2017 0047  ? HGBUR SMALL (A) 06/27/2017 0047  ? HGBUR negative 06/04/2008 1606  ? BILIRUBINUR small 04/03/2020 1203  ? KETONESUR negative 04/17/2019 1533  ? [place] NEGATIVE 06/27/2017 0047  ? PROTEINUR Positive (A) 04/03/2020 1203  ? [place] NEGATIVE 06/27/2017 0047  ? UROBILINOGEN 0.2 04/03/2020 1203  ? UROBILINOGEN 0.2 10/28/2013 2000  ? NITRITE positive 04/03/2020 1203  ? NITRITE NEGATIVE 06/27/2017 0047  ? LEUKOCYTESUR Negative 04/03/2020 1203  ? ?Sepsis Labs: ?'@LABRCNTIP'$ (procalcitonin:4,lacticidven:4) ? ?)No results found for this or any previous visit (from the past 240 hour(s)).  ? ? ? ? ? ?Radiology Studies: ?CT Abdomen Pelvis Wo Contrast ? ?Result Date: 09/12/2021 ?CLINICAL DATA:  Nausea/vomiting. unwitnessed fall at facility. Was found face down with vomit around her. EXAM: CT ABDOMEN AND PELVIS WITHOUT CONTRAST TECHNIQUE: Multidetector CT imaging of the abdomen and pelvis was performed following the standard protocol without IV contrast. RADIATION DOSE REDUCTION: This exam was performed according to the departmental dose-optimization program which includes automated exposure control, adjustment of the mA and/or kV according to  patient size and/or use of iterative reconstruction technique. COMPARISON:  None. FINDINGS: Slightly limited evaluation due to motion artifact. Lower chest: No acute abnormality.  Tiny hiatal hernia. Liver: Mildly enlarged measuring up to 18 cm.  No focal lesion. Biliary System: Status post cholecystectomy. Enlarged common bile duct measuring up to 1.4 cm with associated intraluminal hyperdensities measuring approximately and 0.70.8 cm. Pancreas: Diffusely atrophic. No focal lesion. Query slight fat stranding along the proximal pancreas (2:31). No main pancreatic ductal dilatation. Spleen: Not enlarged. No focal lesion. Adrenal Glands: No nodularity bilaterally. Kidneys: No hydroureteronephrosis. No nephroureterolithiasis. No contour deforming renal mass. Anterior urinary bladder diverticula. Otherwise the urinary bladder is unremarkable. Bowel: No small or large bowel wall thickening or dilatation. Status post appendectomy. Mesentery, Omentum, and Peritoneum: No simple free fluid ascites. No pneumoperitoneum. No mesenteric hematoma identified. No organized fluid collection. Pelvic Organs: Status post hysterectomy. Bilateral adnexal regions are unremarkable. Lymph Nodes: No abdominal, pelvic, inguinal lymphadenopathy. Vasculature: Severe atherosclerotic plaque. No abdominal aorta or iliac aneurysm. Musculoskeletal: No significant soft tissue hematoma. Small to moderate umbilical hernia containing fat with abdominal wall defect of 3.3 cm. No acute pelvic fracture. No spinal fracture. Multilevel degenerative changes spine. IMPRESSION: 1. Findings suggestive of choledocholithiasis in a patient status  post cholecystectomy. Correlate with liver function tests. 2. Query slight fat stranding along the proximal pancreas with limited evaluation due to motion artifact. Correlate with lipase levels. 3. No acute traumatic injury to the abdomen or pelvis on this noncontrast study. 4. No acute fracture or traumatic malalignment  of the lumbar spine. Other imaging findings of potential clinical significance: 1. Small hiatal hernia. 2. Fat containing umbilical hernia. A findings suggestive ischemia or bowel obstruction. 3. Aortic Ath

## 2021-09-13 NOTE — Assessment & Plan Note (Signed)
? ?#)   Acute metabolic encephalopathy: 1 to 2 days of reported confusion relative to baseline mental status, also associated with increased somnolence. Patient's acute encephalopathy appears to be on the basis of physiologic stressors stemming from presenting severe sepsis d/t suspected choledocholithiasis.  No obvious additional contributory underlying infectious process at this time, as further detailed above, but with the caveat that urinalysis result is pending at this time.  There may also be an additional metabolic contribution from relative dehydration given report of recent decline in oral intake as well as recent increase in GI losses in the form of multiple episodes of nausea/vomiting over the last 1 to 2 days, with resultant acute kidney injury. No obvious contributory pharmacologic factors. No overt acute focal neurologic deficits to suggest a contribution from an underlying acute CVA, with CT head showing no evidence of acute intracranial process, including no evidence of intracranial hemorrhage, notable given ground-level fall earlier in the day. Seizures are also felt to be less likely.  Presenting ammonia level to be 46. ? ? ?Plan: fall precautions. Repeat CMP/CBC in the AM. Check magnesium level. check TSH, MMA.  Further evaluation management to present severe sepsis in the setting of choledocholithiasis, including broad-spectrum IV antibiotics, as further detailed above.  Check urinalysis, CPK level.  ? ?

## 2021-09-13 NOTE — Consult Note (Signed)
CONSULT NOTE FOR [facility] ? ?Reason for Consult: Ascending cholangitis ?Referring Physician: [facility] ? ?[place] ?HPI: This is a 88 year old female with a PMH of HTN, hyerlipidemia and s/p lap chole 01/2015 admitted for AMS.  Further evaluation in the ER showed that her liver enzymes were elevated as well as her TB at 5.4.  Her WBC was also markedly elevated at 27,000, but she was not febrile.  Imaging showed that her CBD was enlarged at 14 mm.  In the past she presented with abnormal liver enzymes, 05/2016, and an MRCP was obtained.  It showed choledocholithiasis, but she was asymptomatic at that time.  The plan was for her to follow up and an outpatient with Dr. Loletha Carrow, but the follow up never occurred.  She did not have any biliary problems until now.  Her daughter reported a decline in her mental status over the past couple of days.  This was associated with a couple of bouts of emesis and increased somnolence. ? ?Past Medical History:  ?Diagnosis Date  ? Arthritis   ? "right thumb" (01/29/2015)  ? Chronic shoulder pain   ? "between my shoulders" (01/29/2015)  ? Dizziness   ? Esophageal stricture 2009  ? Gallstones   ? GERD (gastroesophageal reflux disease) 2004  ? Hemorrhoids 2004  ? Hiatal hernia 2004  ? History of blood transfusion 1947  ? "appendix ruptured"  ? HOH (hard of hearing)   ? Hypertension   ? Rhinitis, allergic   ? Syncopal episodes   ? ? ?Past Surgical History:  ?Procedure Laterality Date  ? APPENDECTOMY  1947  ? "ruptured"  ? CARDIAC CATHETERIZATION  10/23/1999; 09/2014  ? EF 60% No significant CAD; patent coronary arteries.  ? CARPAL TUNNEL RELEASE Right 1990's  ? CHOLECYSTECTOMY N/A 01/29/2015  ? Procedure: LAPAROSCOPIC CHOLECYSTECTOMY;  Surgeon: Rolm Bookbinder, MD;  Location: [facility];  Service: General;  Laterality: N/A;  ? DILATION AND CURETTAGE OF UTERUS  "couple"  ? ESOPHAGOGASTRODUODENOSCOPY (EGD) WITH ESOPHAGEAL DILATION  2-3 times  ? HEMORROIDECTOMY    ? KNEE ARTHROSCOPY  Bilateral   ? right x 2  ? LAPAROSCOPIC CHOLECYSTECTOMY  01/29/2015  ? LEFT HEART CATHETERIZATION WITH CORONARY ANGIOGRAM N/A 09/16/2014  ? Procedure: LEFT HEART CATHETERIZATION WITH CORONARY ANGIOGRAM;  Surgeon: [name], MD;  Location: [facility] CATH LAB;  Service: Cardiovascular;  Laterality: N/A;  ? NM MYOVIEW LTD  06/01/2012  ? EF 71%  ? VAGINAL HYSTERECTOMY  1970's  ? ? ?Family History  ?Problem Relation Age of Onset  ? Stroke Father 50  ?     light stroke  ? Heart disease Sister   ? COPD Sister   ? Heart disease Brother   ? Coronary artery disease Neg Hx   ? Diabetes Neg Hx   ? Cancer Neg Hx   ?     breast, colon, prostate  ? ? ?Social History:  reports that she has never smoked. She has never used smokeless tobacco. She reports that she does not drink alcohol and does not use drugs. ? ?Allergies:  ?Allergies  ?Allergen Reactions  ? Contrast Media [Iodinated Contrast Media] Itching and Rash  ?  Pt covered in rash, red.  ? Codeine Nausea And Vomiting  ?  in large doses: can tolerate Tussionex  ? ? ?Medications: Scheduled: ? pantoprazole (PROTONIX) IV  40 mg Intravenous Q24H  ? ?Continuous: ? sodium chloride 100 mL/hr at 09/13/21 1100  ? piperacillin-tazobactam (ZOSYN)  IV 2.25 g (09/13/21  1059)  ? ? ?Results for orders placed or performed during the hospital encounter of 09/12/21 (from the past 24 hour(s))  ?Lipase, blood     Status: None  ? Collection Time: 09/12/21  9:08 PM  ?Result Value Ref Range  ? Lipase 36 11 - 51 U/L  ?Comprehensive metabolic panel     Status: Abnormal  ? Collection Time: 09/12/21  9:08 PM  ?Result Value Ref Range  ? Sodium 138 135 - 145 mmol/L  ? Potassium 3.2 (L) 3.5 - 5.1 mmol/L  ? Chloride 101 98 - 111 mmol/L  ? CO2 23 22 - 32 mmol/L  ? Glucose, Bld 106 (H) 70 - 99 mg/dL  ? BUN 24 (H) 8 - 23 mg/dL  ? Creatinine, Ser 2.20 (H) 0.44 - 1.00 mg/dL  ? Calcium 9.2 8.9 - 10.3 mg/dL  ? Total Protein 6.9 6.5 - 8.1 g/dL  ? Albumin 3.3 (L) 3.5 - 5.0 g/dL  ? AST 148 (H) 15 - 41 U/L  ? ALT 76 (H)  0 - 44 U/L  ? Alkaline Phosphatase 1,048 (H) 38 - 126 U/L  ? Total Bilirubin 5.4 (H) 0.3 - 1.2 mg/dL  ? GFR, Estimated 21 (L) >60 mL/min  ? Anion gap 14 5 - 15  ?CBC     Status: Abnormal  ? Collection Time: 09/12/21  9:08 PM  ?Result Value Ref Range  ? WBC 27.6 (H) 4.0 - 10.5 K/uL  ? RBC 4.52 3.87 - 5.11 MIL/uL  ? Hemoglobin 14.8 12.0 - 15.0 g/dL  ? HCT 43.9 36.0 - 46.0 %  ? MCV 97.1 80.0 - 100.0 fL  ? MCH 32.7 26.0 - 34.0 pg  ? MCHC 33.7 30.0 - 36.0 g/dL  ? RDW 14.2 11.5 - 15.5 %  ? Platelets 251 150 - 400 K/uL  ? nRBC 0.0 0.0 - 0.2 %  ?Differential     Status: Abnormal  ? Collection Time: 09/12/21  9:08 PM  ?Result Value Ref Range  ? Neutrophils Relative % 90 %  ? Neutro Abs 24.8 (H) 1.7 - 7.7 K/uL  ? Lymphocytes Relative 4 %  ? Lymphs Abs 1.1 0.7 - 4.0 K/uL  ? Monocytes Relative 5 %  ? Monocytes Absolute 1.4 (H) 0.1 - 1.0 K/uL  ? Eosinophils Relative 0 %  ? Eosinophils Absolute 0.0 0.0 - 0.5 K/uL  ? Basophils Relative 0 %  ? Basophils Absolute 0.1 0.0 - 0.1 K/uL  ? Immature Granulocytes 1 %  ? Abs Immature Granulocytes 0.24 (H) 0.00 - 0.07 K/uL  ?Magnesium     Status: Abnormal  ? Collection Time: 09/12/21  9:08 PM  ?Result Value Ref Range  ? Magnesium 2.5 (H) 1.7 - 2.4 mg/dL  ?CK     Status: None  ? Collection Time: 09/12/21  9:08 PM  ?Result Value Ref Range  ? Total CK 50 38 - 234 U/L  ?Lactic acid, plasma     Status: None  ? Collection Time: 09/12/21 10:22 PM  ?Result Value Ref Range  ? Lactic Acid, Venous 1.7 0.5 - 1.9 mmol/L  ?Ammonia     Status: Abnormal  ? Collection Time: 09/12/21 10:58 PM  ?Result Value Ref Range  ? Ammonia 46 (H) 9 - 35 umol/L  ?Magnesium     Status: None  ? Collection Time: 09/13/21  4:43 AM  ?Result Value Ref Range  ? Magnesium 2.3 1.7 - 2.4 mg/dL  ?Comprehensive metabolic panel     Status: Abnormal  ? Collection Time: 09/13/21  4:43 AM  ?Result Value Ref Range  ? Sodium 139 135 - 145 mmol/L  ? Potassium 4.2 3.5 - 5.1 mmol/L  ? Chloride 103 98 - 111 mmol/L  ? CO2 25 22 - 32 mmol/L  ?  Glucose, Bld 108 (H) 70 - 99 mg/dL  ? BUN 24 (H) 8 - 23 mg/dL  ? Creatinine, Ser 2.10 (H) 0.44 - 1.00 mg/dL  ? Calcium 8.6 (L) 8.9 - 10.3 mg/dL  ? Total Protein 5.6 (L) 6.5 - 8.1 g/dL  ? Albumin 2.7 (L) 3.5 - 5.0 g/dL  ? AST 157 (H) 15 - 41 U/L  ? ALT 78 (H) 0 - 44 U/L  ? Alkaline Phosphatase 838 (H) 38 - 126 U/L  ? Total Bilirubin 5.1 (H) 0.3 - 1.2 mg/dL  ? GFR, Estimated 22 (L) >60 mL/min  ? Anion gap 11 5 - 15  ?CBC with Differential/Platelet     Status: Abnormal  ? Collection Time: 09/13/21  4:43 AM  ?Result Value Ref Range  ? WBC 27.0 (H) 4.0 - 10.5 K/uL  ? RBC 3.94 3.87 - 5.11 MIL/uL  ? Hemoglobin 13.0 12.0 - 15.0 g/dL  ? HCT 38.6 36.0 - 46.0 %  ? MCV 98.0 80.0 - 100.0 fL  ? MCH 33.0 26.0 - 34.0 pg  ? MCHC 33.7 30.0 - 36.0 g/dL  ? RDW 14.5 11.5 - 15.5 %  ? Platelets 227 150 - 400 K/uL  ? nRBC 0.0 0.0 - 0.2 %  ? Neutrophils Relative % 86 %  ? Neutro Abs 23.1 (H) 1.7 - 7.7 K/uL  ? Lymphocytes Relative 8 %  ? Lymphs Abs 2.2 0.7 - 4.0 K/uL  ? Monocytes Relative 5 %  ? Monocytes Absolute 1.3 (H) 0.1 - 1.0 K/uL  ? Eosinophils Relative 0 %  ? Eosinophils Absolute 0.0 0.0 - 0.5 K/uL  ? Basophils Relative 0 %  ? Basophils Absolute 0.1 0.0 - 0.1 K/uL  ? Immature Granulocytes 1 %  ? Abs Immature Granulocytes 0.25 (H) 0.00 - 0.07 K/uL  ?Bilirubin, direct     Status: Abnormal  ? Collection Time: 09/13/21  4:43 AM  ?Result Value Ref Range  ? Bilirubin, Direct 3.3 (H) 0.0 - 0.2 mg/dL  ?Gamma GT     Status: Abnormal  ? Collection Time: 09/13/21  4:43 AM  ?Result Value Ref Range  ? GGT 1,084 (H) 7 - 50 U/L  ?Protime-INR     Status: None  ? Collection Time: 09/13/21  4:43 AM  ?Result Value Ref Range  ? Prothrombin Time 14.8 11.4 - 15.2 seconds  ? INR 1.2 0.8 - 1.2  ?TSH     Status: None  ? Collection Time: 09/13/21  4:43 AM  ?Result Value Ref Range  ? TSH 1.313 0.350 - 4.500 uIU/mL  ?  ? ?CT Abdomen Pelvis Wo Contrast ? ?Result Date: 09/12/2021 ?CLINICAL DATA:  Nausea/vomiting. unwitnessed fall at facility. Was found face down  with vomit around her. EXAM: CT ABDOMEN AND PELVIS WITHOUT CONTRAST TECHNIQUE: Multidetector CT imaging of the abdomen and pelvis was performed following the standard protocol without IV contrast. RADIAT

## 2021-09-13 NOTE — Progress Notes (Signed)
PHARMACY - PHYSICIAN COMMUNICATION ?CRITICAL VALUE ALERT - BLOOD CULTURE IDENTIFICATION (BCID) ? ?Deanna Shields is an 88 y.o. female who presented to [facility] on 09/12/2021 with a chief complaint of choledocholithiasis.  S/p ERCP on 4/9.  ?- Afebrile, WBC elevated at 27 ? ?Assessment:  2/4 blood culture bottles positive for GNR.  BCID shows Ecoli with no noted resistance patters.   ? ?Name of physician (or Provider) Contacted: Dr. Marthenia Rolling ? ?Current antibiotics: Zosyn ? ?Changes to prescribed antibiotics recommended:  ?Recommendations accepted by provider ?Ceftriaxone 2g IV q24h ?Metronidazole '500mg'$  IV q12h ? ?Results for orders placed or performed during the hospital encounter of 09/12/21  ?Blood Culture ID Panel (Reflexed) (Collected: 09/12/2021 10:22 PM)  ?Result Value Ref Range  ? Enterococcus faecalis NOT DETECTED NOT DETECTED  ? Enterococcus Faecium NOT DETECTED NOT DETECTED  ? Listeria monocytogenes NOT DETECTED NOT DETECTED  ? Staphylococcus species NOT DETECTED NOT DETECTED  ? Staphylococcus aureus (BCID) NOT DETECTED NOT DETECTED  ? Staphylococcus epidermidis NOT DETECTED NOT DETECTED  ? Staphylococcus lugdunensis NOT DETECTED NOT DETECTED  ? Streptococcus species NOT DETECTED NOT DETECTED  ? Streptococcus agalactiae NOT DETECTED NOT DETECTED  ? Streptococcus pneumoniae NOT DETECTED NOT DETECTED  ? Streptococcus pyogenes NOT DETECTED NOT DETECTED  ? A.calcoaceticus-baumannii NOT DETECTED NOT DETECTED  ? Bacteroides fragilis NOT DETECTED NOT DETECTED  ? Enterobacterales DETECTED (A) NOT DETECTED  ? Enterobacter cloacae complex NOT DETECTED NOT DETECTED  ? Escherichia coli DETECTED (A) NOT DETECTED  ? Klebsiella aerogenes NOT DETECTED NOT DETECTED  ? Klebsiella oxytoca NOT DETECTED NOT DETECTED  ? Klebsiella pneumoniae NOT DETECTED NOT DETECTED  ? Proteus species NOT DETECTED NOT DETECTED  ? Salmonella species NOT DETECTED NOT DETECTED  ? Serratia marcescens NOT DETECTED NOT DETECTED  ? Haemophilus  influenzae NOT DETECTED NOT DETECTED  ? Neisseria meningitidis NOT DETECTED NOT DETECTED  ? Pseudomonas aeruginosa NOT DETECTED NOT DETECTED  ? Stenotrophomonas maltophilia NOT DETECTED NOT DETECTED  ? Candida albicans NOT DETECTED NOT DETECTED  ? Candida auris NOT DETECTED NOT DETECTED  ? Candida glabrata NOT DETECTED NOT DETECTED  ? Candida krusei NOT DETECTED NOT DETECTED  ? Candida parapsilosis NOT DETECTED NOT DETECTED  ? Candida tropicalis NOT DETECTED NOT DETECTED  ? Cryptococcus neoformans/gattii NOT DETECTED NOT DETECTED  ? CTX-M ESBL NOT DETECTED NOT DETECTED  ? Carbapenem resistance IMP NOT DETECTED NOT DETECTED  ? Carbapenem resistance KPC NOT DETECTED NOT DETECTED  ? Carbapenem resistance NDM NOT DETECTED NOT DETECTED  ? Carbapenem resist OXA 48 LIKE NOT DETECTED NOT DETECTED  ? Carbapenem resistance VIM NOT DETECTED NOT DETECTED  ? ? ? ? ?Gretta Arab PharmD, BCPS ?Clinical Pharmacist ?[facility] [phone] ?09/13/2021 4:06 PM ?

## 2021-09-13 NOTE — Assessment & Plan Note (Signed)
? ?#)   Hypokalemia: Presenting serum potassium level found to be 3.2: Likely multifactorial in its etiology, with suspected contributions from recent decline in oral intake as well as recent increase in GI losses in the form of nausea/vomiting. ? ?Plan: In the setting of current n.p.o. status, will order potassium chloride 40 mill equivalents IV over 4 hours x 1 dose now.  Add on serum magnesium level.  Monitor on telemetry.  Repeat CMP in the morning. ? ? ? ?

## 2021-09-13 NOTE — H&P (Signed)
?History and Physical  ? ? ?PLEASE NOTE THAT [employer]AGON [employer] SOFTWARE WAS USED IN THE CONSTRUCTION OF THIS NOTE. ? ? ?Deanna Shields HEN:[id number] DOB: [birth year] DOA: 09/12/2021 ? ?PCP: [name], MD  ?Patient coming from: home  ? ?I have personally briefly reviewed patient's old medical records in [place] ? ?Chief Complaint: Altered mental status ? ?HPI: Deanna Shields is a 88 y.o. female with medical history significant for laparoscopic cholecystectomy in August 2016, hypertension, hyperlipidemia, who is admitted to [facility] on 09/12/2021 with choledocholithiasis after presenting from SNF to [facility] ED for evaluation of altered mental status.  ? ?The context of the patient's altered mental status and associated limited ability to contribute to the history, the following history is provided via my discussions with the patient's niece, who is present at bedside, in addition to my discussions with the EDP, and via chart review. ? ?Patient reportedly has been exhibiting evidence of confusion relative to baseline mental status over the last 1 to 2 days.  This is also been associated with some increased somnolence.  There is also been associated nausea over that timeframe, resulting in 2-3 episodes of nonbloody, nonbilious emesis, most recent of which occurred just prior to presenting to [facility] emergency department this evening.  Otherwise, patient is without complaint, although it is unclear if she has been experiencing any abdominal discomfort over that timeframe.  Niece conveys that the patient has been experiencing diminished appetite over the last several days, and oral intake of both food and water over that timeframe. ? ?She also experienced a fall at SNF earlier today, which was reportedly unwitnessed, with staff reporting the patient found on the ground, conscious, but confused, as above.  At that point, EMS was contacted, in the patient was brought to [facility] emergency  department for further evaluation and management of the above.  Not on any blood thinners as an outpatient, including no aspirin. ? ?Notable surgical history includes a history of laparoscopic cholecystectomy in August 2016 performed by Dr. Donne Hazel at [facility]. ? ?Per chart review, patient's baseline creatinine historically has been 1.0-1.3, with value of 1.1 noted on 01/15/2021.  Most recent prior serum creatinine data point noted to be 1.9 on 09/03/2021. ? ? ? ? ?ED Course:  ?Vital signs in the ED were notable for the following: Temperature max 97.7; heart rate initially 99, which decreased to 77 following interval IV fluids; blood pressure 117/52 - 131/47; respiratory rate 14-21, oxygen saturation 96 to 100% on room air. ? ?Labs were notable for the following: CMP notable for the following: Potassium 3.2, creatinine 2.2, glucose 106, albumin 3.3.  Additional liver enzymes in comparison to most recent prior set collected on 09/03/2021 were notable for the following: Presenting alkaline phosphatase 1048 compared to 830 on 09/03/2021, AST 148 compared to 134, ALT 76 compared to 108, total bilirubin 5.4 compared to 2.9; lipase 36.  Ammonia 46.  CBC notable for the following: Will blood cell count 27,600 with 90% neutrophils, hemoglobin 14.8.  Lactic acid 1.7.  Urinalysis ordered, with result currently pending.  Blood cultures x2 collected prior to initiation of IV antibiotics. ? ?Imaging and additional notable ED work-up: Chest x-ray shows no evidence of acute cardiopulmonary process, including no evidence of infiltrate, edema, effusion, or pneumothorax.  CT head showed no evidence of acute intracranial process, including no evidence of intracranial hemorrhage, while CT cervical spine showed no evidence of acute fracture or traumatic listhesis of the cervical spine. ? ?  CT abdomen and pelvis without contrast demonstrated status postcholecystectomy, with enlarged common bile duct measuring up to 1.4 cm with associated  intraluminal hyperdensities measuring approximately 0.7 to 0.8 cm suggestive of choledocholithiasis, will otherwise demonstrating no evidence of acute intra-abdominal or acute intrapelvic process. ? ?EDP contacted the on-call gastroenterologist, Dr Michail Sermon, who conveyed that the patient has an established relationship with [facility] gastroenterology, and consequently requested that [facility] be consulted instead.  Subsequently, EDP contacted Dr. Carol Ada of [facility] gastroenterology regarding the above presentation and imaging, with plan for Dr. Benson Norway To formally consult in the morning. ? ?While in the ED, the following were administered: Zofran 4 mg IV x1, normal saline x500 cc bolus followed by continuous normal saline at 100 cc/h. ? ?Subsequently, the patient was admitted for further evaluation management of choledocholithiasis associated with severe sepsis, with presentation also notable for acute metabolic encephalopathy and presenting laboratory abnormalities notable for hypokalemia as well as acute kidney injury. ? ? ? ?Review of Systems: As per HPI otherwise 10 point review of systems negative.  ? ?Past Medical History:  ?Diagnosis Date  ? Arthritis   ? "right thumb" (01/29/2015)  ? Chronic shoulder pain   ? "between my shoulders" (01/29/2015)  ? Dizziness   ? Esophageal stricture 2009  ? Gallstones   ? GERD (gastroesophageal reflux disease) 2004  ? Hemorrhoids 2004  ? Hiatal hernia 2004  ? History of blood transfusion 1947  ? "appendix ruptured"  ? HOH (hard of hearing)   ? Hypertension   ? Rhinitis, allergic   ? Syncopal episodes   ? ? ?Past Surgical History:  ?Procedure Laterality Date  ? APPENDECTOMY  1947  ? "ruptured"  ? CARDIAC CATHETERIZATION  10/23/1999; 09/2014  ? EF 60% No significant CAD; patent coronary arteries.  ? CARPAL TUNNEL RELEASE Right 1990's  ? CHOLECYSTECTOMY N/A 01/29/2015  ? Procedure: LAPAROSCOPIC CHOLECYSTECTOMY;  Surgeon: Rolm Bookbinder, MD;  Location: [facility];  Service: General;   Laterality: N/A;  ? DILATION AND CURETTAGE OF UTERUS  "couple"  ? ESOPHAGOGASTRODUODENOSCOPY (EGD) WITH ESOPHAGEAL DILATION  2-3 times  ? HEMORROIDECTOMY    ? KNEE ARTHROSCOPY Bilateral   ? right x 2  ? LAPAROSCOPIC CHOLECYSTECTOMY  01/29/2015  ? LEFT HEART CATHETERIZATION WITH CORONARY ANGIOGRAM N/A 09/16/2014  ? Procedure: LEFT HEART CATHETERIZATION WITH CORONARY ANGIOGRAM;  Surgeon: [name], MD;  Location: [facility] CATH LAB;  Service: Cardiovascular;  Laterality: N/A;  ? NM MYOVIEW LTD  06/01/2012  ? EF 71%  ? VAGINAL HYSTERECTOMY  1970's  ? ? ?Social History: ? reports that she has never smoked. She has never used smokeless tobacco. She reports that she does not drink alcohol and does not use drugs. ? ? ?Allergies  ?Allergen Reactions  ? Contrast Media [Iodinated Contrast Media] Itching and Rash  ?  Pt covered in rash, red.  ? Codeine Nausea And Vomiting  ?  in large doses: can tolerate Tussionex  ? ? ?Family History  ?Problem Relation Age of Onset  ? Stroke Father 60  ?     light stroke  ? Heart disease Sister   ? COPD Sister   ? Heart disease Brother   ? Coronary artery disease Neg Hx   ? Diabetes Neg Hx   ? Cancer Neg Hx   ?     breast, colon, prostate  ? ? ?Family history reviewed and not pertinent  ? ? ?Prior to Admission medications   ?Medication Sig Start Date End Date Taking? Authorizing Provider  ?  acetaminophen (TYLENOL) 650 MG suppository Place 650 mg rectally every 4 (four) hours as needed for fever.   Yes [provider]  ?alum & mag hydroxide-simeth (MAALOX PLUS) 400-400-40 MG/5ML suspension Take 10 mLs by mouth every 6 (six) hours as needed for indigestion or heartburn.   Yes [provider]  ?amLODipine (NORVASC) 2.5 MG tablet Take 1 tablet (2.5 mg total) by mouth daily. 04/03/20  Yes [name], DO  ?benzonatate (TESSALON) 100 MG capsule Take 1 capsule by mouth 3 (three) times daily. 12/24/20  Yes [provider]  ?Calcium Carb-Cholecalciferol (HM  CALCIUM-VITAMIN D) 600-800 MG-UNIT TABS Take 1 tablet by mouth 2 (two) times daily.   Yes [provider]  ?Cholecalciferol (VITAMIN D3) 50 MCG (2000 UT) TABS Take 1 capsule by mouth daily.   Yes Provider, Histo

## 2021-09-13 NOTE — ED Notes (Signed)
Admitting provider at bedside.

## 2021-09-13 NOTE — Assessment & Plan Note (Signed)
? ?#)   GERD: documented h/o such; on omeprazole as outpatient.  ? ?Plan: In the setting of current n.p.o. status in context of presenting suspected choledocholithiasis, will temporarily convert outpatient oral omeprazole to daily dosing of IV Protonix. ? ? ?

## 2021-09-13 NOTE — Anesthesia Preprocedure Evaluation (Signed)
Anesthesia Evaluation  ?Patient identified by MRN, date of birth, ID band ?Patient awake ? ? ? ?Reviewed: ?Allergy & Precautions, NPO status , Patient's Chart, lab work & pertinent test results ? ?Airway ?Mallampati: I ? ? ? ? ? ? Dental ? ?(+) Edentulous Upper, Edentulous Lower ?  ?Pulmonary ? ?  ?Pulmonary exam normal ? ? ? ? ? ? ? Cardiovascular ?hypertension, Pt. on medications ?Normal cardiovascular exam ? ? ?  ?Neuro/Psych ?PSYCHIATRIC DISORDERS Dementia   ? GI/Hepatic ?hiatal hernia, GERD  Medicated and Controlled,  ?Endo/Other  ?negative endocrine ROS ? Renal/GU ?Renal InsufficiencyRenal disease  ?negative genitourinary ?  ?Musculoskeletal ? ? Abdominal ?Normal abdominal exam  (+)   ?Peds ? Hematology ?  ?Anesthesia Other Findings ? ? Reproductive/Obstetrics ? ?  ? ? ? ? ? ? ? ? ? ? ? ? ? ?  ?  ? ? ? ? ? ? ? ? ?Anesthesia Physical ?Anesthesia Plan ? ?ASA: 2 ? ?Anesthesia Plan: General  ? ?Post-op Pain Management: Minimal or no pain anticipated  ? ?Induction: Intravenous ? ?PONV Risk Score and Plan: 3 and Ondansetron ? ?Airway Management Planned: Oral ETT ? ?Additional Equipment: None ? ?Intra-op Plan:  ? ?Post-operative Plan: Extubation in OR ? ?Informed Consent: I have reviewed the patients History and Physical, chart, labs and discussed the procedure including the risks, benefits and alternatives for the proposed anesthesia with the patient or authorized representative who has indicated his/her understanding and acceptance.  ? ? ? ?Dental advisory given ? ?Plan Discussed with: CRNA ? ?Anesthesia Plan Comments:   ? ? ? ? ? ? ?Anesthesia Quick Evaluation ? ?

## 2021-09-13 NOTE — Assessment & Plan Note (Signed)
? ?#)  Choledocholithiasis: Diagnosis suspected on the basis of presenting nausea/vomiting, with laboratory evidence of cholestatic process including alkaline phosphatase greater than 1000 as well as total bilirubin of 5.4, while CT abdomen/pelvis shows enlarged common bile duct measuring up to 1.4 cm with associated intraluminal hyperdensities measuring approximately 0.7 to 0.8 cm suggestive of choledocholithiasis in this patient who is status laparoscopic postcholecystectomy in August 2016.  The setting of leukocytosis, tachycardia, tachypnea, criteria for sepsis are currently met, with criteria for patient's sepsis to be considered severe met via concomitant acute kidney injury as well as acute encephalopathy.  In this setting, will initiate IV antibiotics including anaerobic coverage.  Blood cultures x2 collected in the ED this evening prior to initiation of IV antibiotics. ? ?At this time, she is without overt evidence of ascending cholangitis, although has some elements that may be suggestive of early development of such in the form of elevated bilirubin without overt jaundice, with presentation also associated with altered mental status.  Of note, patient has not been objectively febrile in the ED, nor has she been hypotensive. ? ?As described above, on-call [facility] gastroenterologist, Dr. Carol Ada, Has been consulted, with additional recommendations pending at this time. ? ? ?  ?Plan: Continuous IV fluids. Monitor for results of blood cultures x2 collected prior to initiation of antibiotics.  Start Zosyn.  NPO. Repeat CMP.  Check direct bilirubin. Check GGT to further qualify alk phos level.  Check INR. GI consulted, as above. Repeat CBC with differential in the morning. Closing monitoring for development of hypotension as well as close monitoring for development of fever. Prn IV Fentanyl. Prn IV Zofran. Pre-op EKG. Check serum Mg level. ? ? ? ? ? ?

## 2021-09-13 NOTE — Assessment & Plan Note (Signed)
? ?#)   Hyperlipidemia: documented h/o such. On high intensity rosuvastatin as outpatient.  In the setting of current n.p.o. status as well as the presence of acute transaminitis, will hold home statin for now. ? ? ?Plan: Hold home statin.  Check CPK level. ? ? ?

## 2021-09-13 NOTE — Anesthesia Procedure Notes (Signed)
Procedure Name: Intubation ?Date/Time: 09/13/2021 2:21 PM ?Performed by: Milford Cage, CRNA ?Pre-anesthesia Checklist: Patient identified, Emergency Drugs available, Suction available and Patient being monitored ?Patient Re-evaluated:Patient Re-evaluated prior to induction ?Oxygen Delivery Method: Circle system utilized ?Preoxygenation: Pre-oxygenation with 100% oxygen ?Induction Type: IV induction ?Ventilation: Mask ventilation without difficulty ?Laryngoscope Size: Mac and 3 ?Grade View: Grade I ?Tube type: Oral ?Tube size: 7.0 mm ?Number of attempts: 1 ?Airway Equipment and Method: Stylet and Oral airway ?Placement Confirmation: ETT inserted through vocal cords under direct vision, positive ETCO2 and breath sounds checked- equal and bilateral ?Secured at: 21 cm ?Tube secured with: Tape ?Dental Injury: Teeth and Oropharynx as per pre-operative assessment  ? ? ? ? ?

## 2021-09-13 NOTE — Progress Notes (Signed)
Pharmacy Antibiotic Note ? ?Deanna Shields is a 88 y.o. female admitted on 09/12/2021 with Altered mental status, vomiting, jaundice. CT imaging reveals choledocholithiasis. Pharmacy has been consulted for zosyn dosing. ? ?Plan: ?Zozsyn 2.25gm IV q6h ?Follow renal function and clinical course ? ?Height: '5\' 6"'$  (167.6 cm) ?Weight: 85.7 kg (189 lb) ?IBW/kg (Calculated) : 59.3 ? ?Temp (24hrs), Avg:97.9 ?F (36.6 ?C), Min:97.7 ?F (36.5 ?C), Max:98 ?F (36.7 ?C) ? ?Recent Labs  ?Lab 09/12/21 ?2108 09/12/21 ?2222  ?WBC 27.6*  --   ?CREATININE 2.20*  --   ?LATICACIDVEN  --  1.7  ?  ?Estimated Creatinine Clearance: 18.8 mL/min (A) (by C-G formula based on SCr of 2.2 mg/dL (H)).   ? ?Allergies  ?Allergen Reactions  ? Contrast Media [Iodinated Contrast Media] Itching and Rash  ?  Pt covered in rash, red.  ? Codeine Nausea And Vomiting  ?  in large doses: can tolerate Tussionex  ? ? ?Thank you for allowing pharmacy to be a part of this patient?s care. ? ?.ehj ?

## 2021-09-13 NOTE — Transfer of Care (Signed)
Immediate Anesthesia Transfer of Care Note ? ?Patient: Deanna Shields ? ?Procedure(s) Performed: ENDOSCOPIC RETROGRADE CHOLANGIOPANCREATOGRAPHY (ERCP) ? ?Patient Location: PACU ? ?Anesthesia Type:General ? ?Level of Consciousness: awake ? ?Airway & Oxygen Therapy: Patient Spontanous Breathing ? ?Post-op Assessment: Report given to RN and Post -op Vital signs reviewed and stable ? ?Post vital signs: Reviewed and stable ? ?Last Vitals:  ?Vitals Value Taken Time  ?BP 139/47 09/13/21 1515  ?Temp    ?Pulse 82 09/13/21 1519  ?Resp 18 09/13/21 1519  ?SpO2 99 % 09/13/21 1519  ?Vitals shown include unvalidated device data. ? ?Last Pain:  ?Vitals:  ? 09/13/21 1338  ?TempSrc: Temporal  ?PainSc:   ?   ? ?  ? ?Complications: No notable events documented. ?

## 2021-09-13 NOTE — Op Note (Signed)
[facility] ?Patient Name: Deanna Shields ?Procedure Date: 09/13/2021 ?MRN: [id number] ?Attending MD: Carol Ada , MD ?Date of Birth: 1929-01-29 ?CSN: [id number] ?Age: 88 ?Admit Type: Inpatient ?Procedure:                ERCP ?Indications:              Common bile duct stone(s) ?Providers:                Carol Ada, MD, Grace Isaac, RN, Tyna Jaksch  ?                          Technician ?Referring MD:              ?Medicines:                General Anesthesia ?Complications:            No immediate complications. ?Estimated Blood Loss:     Estimated blood loss: none. ?Procedure:                Pre-Anesthesia Assessment: ?                          - Prior to the procedure, a History and Physical  ?                          was performed, and patient medications and  ?                          allergies were reviewed. The patient's tolerance of  ?                          previous anesthesia was also reviewed. The risks  ?                          and benefits of the procedure and the sedation  ?                          options and risks were discussed with the patient.  ?                          All questions were answered, and informed consent  ?                          was obtained. Prior Anticoagulants: The patient has  ?                          taken no previous anticoagulant or antiplatelet  ?                          agents. ASA Grade Assessment: II - A patient with  ?                          mild systemic disease. After reviewing the risks  ?                          and benefits,  the patient was deemed in  ?                          satisfactory condition to undergo the procedure. ?                          - Sedation was administered by an anesthesia  ?                          professional. Deep sedation was attained. ?                          After obtaining informed consent, the scope was  ?                          passed under direct vision. Throughout the  ?                           procedure, the patient's blood pressure, pulse, and  ?                          oxygen saturations were monitored continuously. The  ?                          [id number]-[id number] ([id number]) Olympus duodenoscope was  ?                          introduced through the mouth, and used to inject  ?                          contrast into and used to inject contrast into the  ?                          bile duct. The ERCP was accomplished without  ?                          difficulty. The patient tolerated the procedure  ?                          well. ?Scope In: ?Scope Out: ?Findings: ?     The major papilla was normal. The bile duct was deeply cannulated with  ?     the short-nosed traction sphincterotome. Contrast was injected. I  ?     personally interpreted the bile duct images. There was brisk flow of  ?     contrast through the ducts. Image quality was excellent. Contrast  ?     extended to the entire biliary tree. The common bile duct contained  ?     three stones, the largest of which was 12 mm in diameter. The common  ?     bile duct was markedly dilated, with a stone causing an obstruction. The  ?     largest diameter was 16 mm. A cholecystectomy had been performed. A  ?     short 0.035 inch Soft Jagwire was passed into the biliary tree. A 15 mm  ?  biliary sphincterotomy was made with a traction (standard)  ?     sphincterotome using ERBE electrocautery. There was no  ?     post-sphincterotomy bleeding. The biliary tree was swept with an 18 mm  ?     balloon starting at the bifurcation. Three stones were removed. No  ?     stones remained. ?     The CBD was easily cannulated and it was achieved during the first  ?     attempt. The guidewire was secured in the right intrahepatic ducts.  ?     Contrast injections revealed a markedly dilated CBD and some mild  ?     intrahepatic biliary ductal dilation. Some radiolucencies were noted,  ?     but the exact number was unknown. Multiple sweeps with the 15 mm  balloon  ?     extracted all the stones. Because of the large diameter of the CBD, an  ?     18 mm balloon was used. Three sweeps with this balloon did not yield any  ?     further stones. The final occlusion cholangiogram was negative for any  ?     retained stones. ?Impression:               - The major papilla appeared normal. ?                          - The common bile duct was markedly dilated, with a  ?                          stone causing an obstruction. ?                          - The patient has had a cholecystectomy. ?                          - Choledocholithiasis was found. Complete removal  ?                          was accomplished by biliary sphincterotomy and  ?                          balloon extraction. ?                          - A biliary sphincterotomy was performed. ?                          - The biliary tree was swept. ?Moderate Sedation: ?     Not Applicable - Patient had care per Anesthesia. ?Recommendation:           - Return patient to hospital ward for ongoing care. ?                          - Resume regular diet. ?                          - Continue antibiotics. ?                          -  Follow liver panel. ?Procedure Code(s):        --- Professional --- ?                          [phone], Endoscopic retrograde  ?                          cholangiopancreatography (ERCP); with removal of  ?                          calculi/debris from biliary/pancreatic duct(s) ?                          [id number], Endoscopic retrograde  ?                          cholangiopancreatography (ERCP); with  ?                          sphincterotomy/papillotomy ?                          [id number], Endoscopic catheterization of the biliary  ?                          ductal system, radiological supervision and  ?                          interpretation ?Diagnosis Code(s):        --- Professional --- ?                          K80.51, Calculus of bile duct without cholangitis  ?                          or  cholecystitis with obstruction ?                          Z90.49, Acquired absence of other specified parts  ?                          of digestive tract ?CPT copyright 2019 [employer]. All rights reserved. ?The codes documented in this report are preliminary and upon coder review may  ?be revised to meet current compliance requirements. ?Carol Ada, MD ?Carol Ada, MD ?09/13/2021 3:11:39 PM ?This report has been signed electronically. ?Number of Addenda: 0 ?

## 2021-09-14 ENCOUNTER — Encounter (HOSPITAL_COMMUNITY): Payer: Self-pay | Admitting: Gastroenterology

## 2021-09-14 DIAGNOSIS — K8309 Other cholangitis: Secondary | ICD-10-CM

## 2021-09-14 DIAGNOSIS — K8050 Calculus of bile duct without cholangitis or cholecystitis without obstruction: Secondary | ICD-10-CM | POA: Diagnosis not present

## 2021-09-14 LAB — COMPREHENSIVE METABOLIC PANEL
ALT: 58 U/L — ABNORMAL HIGH (ref 0–44)
AST: 69 U/L — ABNORMAL HIGH (ref 15–41)
Albumin: 2.6 g/dL — ABNORMAL LOW (ref 3.5–5.0)
Alkaline Phosphatase: 672 U/L — ABNORMAL HIGH (ref 38–126)
Anion gap: 10 (ref 5–15)
BUN: 33 mg/dL — ABNORMAL HIGH (ref 8–23)
CO2: 17 mmol/L — ABNORMAL LOW (ref 22–32)
Calcium: 7.6 mg/dL — ABNORMAL LOW (ref 8.9–10.3)
Chloride: 110 mmol/L (ref 98–111)
Creatinine, Ser: 2.54 mg/dL — ABNORMAL HIGH (ref 0.44–1.00)
GFR, Estimated: 17 mL/min — ABNORMAL LOW (ref >60)
Glucose, Bld: 161 mg/dL — ABNORMAL HIGH (ref 70–99)
Potassium: 4.3 mmol/L (ref 3.5–5.1)
Sodium: 137 mmol/L (ref 135–145)
Total Bilirubin: 1.5 mg/dL — ABNORMAL HIGH (ref 0.3–1.2)
Total Protein: 5.5 g/dL — ABNORMAL LOW (ref 6.5–8.1)

## 2021-09-14 LAB — CBC WITH DIFFERENTIAL/PLATELET
Abs Immature Granulocytes: 0.09 10*3/uL — ABNORMAL HIGH (ref 0.00–0.07)
Basophils Absolute: 0 10*3/uL (ref 0.0–0.1)
Basophils Relative: 0 %
Eosinophils Absolute: 0 10*3/uL (ref 0.0–0.5)
Eosinophils Relative: 0 %
HCT: 40.8 % (ref 36.0–46.0)
Hemoglobin: 13 g/dL (ref 12.0–15.0)
Immature Granulocytes: 1 %
Lymphocytes Relative: 8 %
Lymphs Abs: 1.1 10*3/uL (ref 0.7–4.0)
MCH: 32.8 pg (ref 26.0–34.0)
MCHC: 31.9 g/dL (ref 30.0–36.0)
MCV: 103 fL — ABNORMAL HIGH (ref 80.0–100.0)
Monocytes Absolute: 0.2 10*3/uL (ref 0.1–1.0)
Monocytes Relative: 1 %
Neutro Abs: 13.7 10*3/uL — ABNORMAL HIGH (ref 1.7–7.7)
Neutrophils Relative %: 90 %
Platelets: 225 10*3/uL (ref 150–400)
RBC: 3.96 MIL/uL (ref 3.87–5.11)
RDW: 14.7 % (ref 11.5–15.5)
WBC: 15.1 10*3/uL — ABNORMAL HIGH (ref 4.0–10.5)
nRBC: 0 % (ref 0.0–0.2)

## 2021-09-14 MED ORDER — PANTOPRAZOLE SODIUM 40 MG PO TBEC
40.0000 mg | DELAYED_RELEASE_TABLET | Freq: Every day | ORAL | Status: DC
  Administered 2021-09-15 – 2021-09-16 (×2): 40 mg via ORAL
  Filled 2021-09-14 (×2): qty 1

## 2021-09-14 MED ORDER — MELATONIN 3 MG PO TABS
3.0000 mg | ORAL_TABLET | Freq: Every day | ORAL | Status: DC
  Administered 2021-09-15 (×2): 3 mg via ORAL
  Filled 2021-09-14 (×2): qty 1

## 2021-09-14 NOTE — Plan of Care (Signed)
Pt ambulated the hallway this shift to the nurse's station and back. ?Problem: Education: ?Goal: Knowledge of General Education information will improve ?Description: Including pain rating scale, medication(s)/side effects and non-pharmacologic comfort measures ?Outcome: Progressing ?  ?Problem: Health Behavior/Discharge Planning: ?Goal: Ability to manage health-related needs will improve ?Outcome: Progressing ?  ?Problem: Clinical Measurements: ?Goal: Ability to maintain clinical measurements within normal limits will improve ?Outcome: Progressing ?Goal: Will remain free from infection ?Outcome: Progressing ?Goal: Diagnostic test results will improve ?Outcome: Progressing ?Goal: Respiratory complications will improve ?Outcome: Progressing ?Goal: Cardiovascular complication will be avoided ?Outcome: Progressing ?  ?Problem: Activity: ?Goal: Risk for activity intolerance will decrease ?Outcome: Progressing ?  ?Problem: Nutrition: ?Goal: Adequate nutrition will be maintained ?Outcome: Progressing ?  ?Problem: Coping: ?Goal: Level of anxiety will decrease ?Outcome: Progressing ?  ?Problem: Elimination: ?Goal: Will not experience complications related to bowel motility ?Outcome: Progressing ?Goal: Will not experience complications related to urinary retention ?Outcome: Progressing ?  ?Problem: Pain Managment: ?Goal: General experience of comfort will improve ?Outcome: Progressing ?  ?Problem: Safety: ?Goal: Ability to remain free from injury will improve ?Outcome: Progressing ?  ?Problem: Skin Integrity: ?Goal: Risk for impaired skin integrity will decrease ?Outcome: Progressing ?  ?

## 2021-09-14 NOTE — TOC Initial Note (Signed)
Transition of Care (TOC) - Initial/Assessment Note  ? ? ?Patient Details  ?Name: Deanna Shields ?MRN: [id number] ?Date of Birth: 1929-04-19 ? ?Transition of Care (TOC) CM/SW Contact:    ?Chisum Habenicht, LCSW ?Phone Number: ?09/14/2021, 2:51 PM ? ?Clinical Narrative:                 ?Met with pt and niece, Jasmine December, today to review for dc needs.  Pt confirms that she is a resident at [facility] in the ALF section and fully intends to return there at dc.  Reports that her prior level of function is mod ind with rolling walker and has been up in the room here without concern.  Niece reports her understanding is that pt will not be ready for dc until tomorrow at the earliest.   ?I have left a VM for SW at [facility], [place], and will keep her updated on pt's return.  Continue to follow. ? ?Expected Discharge Plan: Assisted Living ?Barriers to Discharge: Continued Medical Work up ? ? ?Patient Goals and CMS Choice ?Patient states their goals for this hospitalization and ongoing recovery are:: return to her AL apt ?  ?  ? ?Expected Discharge Plan and Services ?Expected Discharge Plan: Assisted Living ?  ?  ?  ?Living arrangements for the past 2 months: Assisted Living Facility (@ [facility]) ?                ?  ?  ?  ?  ?  ?  ?  ?  ?  ?  ? ?Prior Living Arrangements/Services ?Living arrangements for the past 2 months: Assisted Living Facility (@ [facility]) ?Lives with:: Facility Resident ?Patient language and need for interpreter reviewed:: Yes ?Do you feel safe going back to the place where you live?: Yes      ?Need for Family Participation in Patient Care: No (Comment) ?Care giver support system in place?: Yes (comment) ?  ?Criminal Activity/Legal Involvement Pertinent to Current Situation/Hospitalization: No - Comment as needed ? ?Activities of Daily Living ?Home Assistive Devices/Equipment: Dan Humphreys (specify type) ?ADL Screening (condition at time of admission) ?Patient's cognitive ability  adequate to safely complete daily activities?: Yes ?Is the patient deaf or have difficulty hearing?: Yes ?Does the patient have difficulty seeing, even when wearing glasses/contacts?: Yes ?Does the patient have difficulty concentrating, remembering, or making decisions?: Yes ?Patient able to express need for assistance with ADLs?: Yes ?Does the patient have difficulty dressing or bathing?: Yes ?Independently performs ADLs?: Yes (appropriate for developmental age) ?Does the patient have difficulty walking or climbing stairs?: Yes ?Weakness of Legs: Both ?Weakness of Arms/Hands: Both ? ?Permission Sought/Granted ?Permission sought to share information with : Family Supports, Magazine features editor ?Permission granted to share information with : Yes, Verbal Permission Granted ? Share Information with NAME: Elita Quick ?   ? Permission granted to share info w Relationship: Niece ? Permission granted to share info w Contact Information: [phone] ? ?Emotional Assessment ?Appearance:: Appears stated age ?Attitude/Demeanor/Rapport: Gracious, Engaged ?Affect (typically observed): Accepting ?Orientation: : Oriented to Self, Oriented to Place, Oriented to  Time, Oriented to Situation ?Alcohol / Substance Use: Not Applicable ?Psych Involvement: No (comment) ? ?Admission diagnosis:  Choledocholithiasis [K80.50] ?Hypokalemia [E87.6] ?CKD (chronic kidney disease) stage 4, GFR 15-29 ml/min (HCC) [N18.4] ?Abnormal LFTs (liver function tests) [R79.89] ?Fall, initial encounter [W19.XXXA] ?Leukocytosis, unspecified type [D72.829] ?Patient Active Problem List  ? Diagnosis Date Noted  ? Severe sepsis (HCC) 09/13/2021  ? Acute  metabolic encephalopathy 09/13/2021  ? AKI (acute kidney injury) (HCC) 09/13/2021  ? Leukocytosis 09/04/2021  ? COVID-19 virus infection 05/29/2021  ? Generalized weakness 01/08/2021  ? UTI (urinary tract infection) 01/08/2021  ? CKD (chronic kidney disease) stage 3, GFR 30-59 ml/min (HCC) 12/29/2020   ? Edema of both lower extremities due to peripheral venous insufficiency 08/06/2020  ? Senile dementia (HCC) 07/11/2020  ? Vitamin B12 deficiency anemia 07/11/2020  ? Hypokalemia 05/15/2020  ? Memory loss 04/06/2020  ? Falls frequently 12/01/2017  ? Gastroenteritis 07/13/2017  ? Symptomatic PVCs 05/26/2017  ? Normal coronary arteries 05/26/2017  ? HLD (hyperlipidemia) 05/26/2017  ? Palpitations 05/26/2017  ? Bronchitis 03/31/2017  ? Transaminasemia 06/02/2016  ? Choledocholithiasis   ? Right middle lobe pneumonia 05/31/2016  ? CAP (community acquired pneumonia) 05/31/2016  ? Nausea & vomiting 05/31/2016  ? S/P laparoscopic cholecystectomy 01/29/2015  ? Pain in the chest   ? Abnormal nuclear stress test   ? Allergic rhinitis 07/12/2014  ? Viral stomatitis 07/05/2014  ? History of syncope 11/10/2013  ? Hyponatremia 11/10/2013  ? Chest pain 10/28/2013  ? Dizziness 10/19/2013  ? Near syncope 10/19/2013  ? Leg cramps 12/25/2012  ? Bilateral claudication of lower limb (HCC) 12/25/2012  ? Stress reaction 09/15/2012  ? Hearing loss of right ear 07/31/2012  ? Retinal tear 11/03/2011  ? ABDOMINAL PAIN, RIGHT UPPER QUADRANT 06/18/2010  ? TINEA CRURIS 10/29/2009  ? Cerumen impaction 07/28/2009  ? NECK PAIN, RIGHT 07/28/2009  ? DIZZINESS 07/28/2009  ? Vitamin D deficiency 06/16/2009  ? Anemia, iron deficiency 04/01/2009  ? Shortness of breath 02/03/2009  ? DYSURIA 06/04/2008  ? INTERNAL HEMORRHOIDS 04/22/2008  ? HIATAL HERNIA 04/22/2008  ? GERD 03/21/2008  ? Allergic rhinitis, cause unspecified 08/10/2007  ? OSTEOPENIA 03/22/2007  ? BACKACHE NOS 02/23/2007  ? COUGH, CHRONIC 02/23/2007  ? Hypertension 11/17/2006  ? DEPENDENT EDEMA 11/17/2006  ? GERD (gastroesophageal reflux disease) 2004  ? ?PCP:  Mahlon Gammon, MD ?Pharmacy:   ?[facility] [place], [place] - [address] ?[address] [place] [address] ?[place] [address] ?Phone: [phone] Fax: [phone] ? ?[facility] - [place], [place] - [address] ?[address] ?[place] [address] ?Phone: [phone] Fax: [phone] ? ? ? ? ?Social Determinants of Health (SDOH) Interventions ?  ? ?Readmission Risk Interventions ? ?  09/14/2021  ?  2:32 PM  ?Readmission Risk Prevention Plan  ?Transportation Screening Complete  ?PCP or Specialist Appt within 5-7 Days Complete  ?Home Care Screening Complete  ? ? ? ?

## 2021-09-14 NOTE — Progress Notes (Signed)
?PROGRESS NOTE ? ? ? ?Vermont D Spilman  MPN:[id number] DOB: 11/05/1928 DOA: 09/12/2021 ?PCP: [name], MD  ?Outpatient Specialists:  ? ? ? ?Brief Narrative:  ?Patient is a 88 year old Caucasian female past medical history significant for laparoscopic cholecystectomy in 2016, hypertension, chronic kidney disease stage III and hyperlipidemia.  She is a resident of an assisted living facility.  Patient was admitted with altered mental status, worsening renal function, choledocholithiasis and ascending cholangitis.  Patient has been seen by GI team.  Patient has undergone ERCP.  3 common bile duct stones were found and removed.  Dilatation of the common bile duct was also noted. ? ?09/14/2021: Patient saline lock to the patient's niece.  Patient looks better, but remains encephalopathic.  Leukocytosis is improving.  Worsening renal function is noted. ? ?Assessment & Plan: ?  ?Principal Problem: ?  Choledocholithiasis ?Active Problems: ?  Hypertension ?  HLD (hyperlipidemia) ?  Hypokalemia ?  Severe sepsis ([place]) ?  Acute metabolic encephalopathy ?  AKI (acute kidney injury) ([place]) ?  GERD (gastroesophageal reflux disease) ? ? ?Choledocholithiasis/ascending cholangitis/severe sepsis with organ failure: ?-Patient has undergone ERCP and stone removal. ?-Continue IV antibiotics, but changed to Rocephin and Flagyl. ?-Follow LFTs. ?09/14/2021: Patient is improving.  Patient is better today.  Blood culture has grown E. coli (preliminary result) ? ?Acute kidney injury on chronic kidney disease stage IIIa/B: ?-Acute kidney injury is likely secondary to combined ATN/prerenal. ?-Adequately hydrate patient. ?-Treat underlying ascending cholangitis. ?-Avoid nephrotoxins. ?-Continue to monitor renal function and electrolytes. ?09/14/2021: Worsening over serum creatinine.  Hopefully, AKI will plateau fairly soon.  Continue current management.  Continue to avoid nephrotoxins. ? ?Hypertension: ?-Continue to optimize.    ? ?Encephalopathy: ?-Combined toxic and metabolic. ?-Seems to have resolved significantly. ? ?DVT prophylaxis: Start subcutaneous heparin 5000 units twice daily. ?Code Status: DNR ?Family Communication: Niece/healthcare power of attorney ?Disposition Plan: Likely back to assisted living facility ? ? ?Consultants:  ?GI ? ?Procedures:  ?ERCP ? ?Antimicrobials:  ?IV Zosyn discontinued. ?IV Rocephin ?IV Flagyl ? ? ?Subjective: ?No new complaints ?No fever or chills ? ?Objective: ?Vitals:  ? 09/13/21 1550 09/14/21 0329 09/14/21 0404 09/14/21 1241  ?BP: (!) 143/59 (!) 121/58  (!) 132/58  ?Pulse: 74 76  69  ?Resp: '16 16  20  '$ ?Temp: 97.9 ?F (36.6 ?C) (!) 97.3 ?F (36.3 ?C)    ?TempSrc: Oral Axillary    ?SpO2: 99% 100%  100%  ?Weight:   85.2 kg   ?Height:      ? ? ?Intake/Output Summary (Last 24 hours) at 09/14/2021 1424 ?Last data filed at 09/14/2021 0423 ?Gross per 24 hour  ?Intake 1061.67 ml  ?Output --  ?Net 1061.67 ml  ? ?Filed Weights  ? 09/12/21 2019 09/13/21 0500 09/14/21 0404  ?Weight: 85.7 kg 81.8 kg 85.2 kg  ? ? ?Examination: ? ?General exam: Appears calm and comfortable ?HEENT: Pale. ?Neck: Supple.  No raised JVD. ?Respiratory system: Clear to auscultation. Respiratory effort normal. ?Cardiovascular system: S1 & S2  ?Gastrointestinal system: Abdomen is nondistended, soft and nontender. No organomegaly or masses felt. Normal bowel sounds heard. ?Central nervous system: Awake and alert.  Moves all extremities.   ?Extremities: No leg edema. ? ?Data Reviewed: I have personally reviewed following labs and imaging studies ? ?CBC: ?Recent Labs  ?Lab 09/12/21 ?2108 09/13/21 ?0443 09/14/21 ?0908  ?WBC 27.6* 27.0* 15.1*  ?NEUTROABS 24.8* 23.1* 13.7*  ?HGB 14.8 13.0 13.0  ?HCT 43.9 38.6 40.8  ?MCV 97.1 98.0 103.0*  ?PLT 251 227  225  ? ? ?Basic Metabolic Panel: ?Recent Labs  ?Lab 09/12/21 ?2108 09/13/21 ?0443 09/14/21 ?0908  ?NA 138 139 137  ?K 3.2* 4.2 4.3  ?CL 101 103 110  ?CO2 23 25 17*  ?GLUCOSE 106* 108* 161*  ?BUN 24*  24* 33*  ?CREATININE 2.20* 2.10* 2.54*  ?CALCIUM 9.2 8.6* 7.6*  ?MG 2.5* 2.3  --   ? ? ?GFR: ?Estimated Creatinine Clearance: 16.2 mL/min (A) (by C-G formula based on SCr of 2.54 mg/dL (H)). ?Liver Function Tests: ?Recent Labs  ?Lab 09/12/21 ?2108 09/13/21 ?0443 09/14/21 ?0908  ?AST 148* 157* 69*  ?ALT 76* 78* 58*  ?ALKPHOS 1,048* 838* 672*  ?BILITOT 5.4* 5.1* 1.5*  ?PROT 6.9 5.6* 5.5*  ?ALBUMIN 3.3* 2.7* 2.6*  ? ? ?Recent Labs  ?Lab 09/12/21 ?2108  ?LIPASE 36  ? ? ?Recent Labs  ?Lab 09/12/21 ?2258  ?AMMONIA 46*  ? ? ?Coagulation Profile: ?Recent Labs  ?Lab 09/13/21 ?4944  ?INR 1.2  ? ? ?Cardiac Enzymes: ?Recent Labs  ?Lab 09/12/21 ?2108  ?CKTOTAL 50  ? ? ?BNP (last 3 results) ?No results for input(s): PROBNP in the last 8760 hours. ?HbA1C: ?No results for input(s): HGBA1C in the last 72 hours. ?CBG: ?No results for input(s): GLUCAP in the last 168 hours. ?Lipid Profile: ?No results for input(s): CHOL, HDL, LDLCALC, TRIG, CHOLHDL, LDLDIRECT in the last 72 hours. ?Thyroid Function Tests: ?Recent Labs  ?  09/13/21 ?0443  ?TSH 1.313  ? ? ?Anemia Panel: ?No results for input(s): VITAMINB12, FOLATE, FERRITIN, TIBC, IRON, RETICCTPCT in the last 72 hours. ?Urine analysis: ?   ?Component Value Date/Time  ? COLORURINE YELLOW 06/27/2017 0047  ? APPEARANCEUR CLEAR 06/27/2017 0047  ? LABSPEC 1.009 06/27/2017 0047  ? PHURINE 5.0 06/27/2017 0047  ? GLUCOSEU NEGATIVE 06/27/2017 0047  ? HGBUR SMALL (A) 06/27/2017 0047  ? HGBUR negative 06/04/2008 1606  ? BILIRUBINUR small 04/03/2020 1203  ? KETONESUR negative 04/17/2019 1533  ? [place] NEGATIVE 06/27/2017 0047  ? PROTEINUR Positive (A) 04/03/2020 1203  ? [place] NEGATIVE 06/27/2017 0047  ? UROBILINOGEN 0.2 04/03/2020 1203  ? UROBILINOGEN 0.2 10/28/2013 2000  ? NITRITE positive 04/03/2020 1203  ? NITRITE NEGATIVE 06/27/2017 0047  ? LEUKOCYTESUR Negative 04/03/2020 1203  ? ?Sepsis Labs: ?'@LABRCNTIP'$ (procalcitonin:4,lacticidven:4) ? ?) ?Recent Results (from the past 240 hour(s))   ?Culture, blood (Routine X 2) w Reflex to ID Panel     Status: None (Preliminary result)  ? Collection Time: 09/12/21  2:18 AM  ? Specimen: BLOOD  ?Result Value Ref Range Status  ? Specimen Description   Final  ?  BLOOD BLOOD RIGHT HAND ?Performed at [facility], [place] [address] [place], [place] [address] ?  ? Special Requests   Final  ?  BOTTLES DRAWN AEROBIC AND ANAEROBIC Blood Culture results may not be optimal due to an inadequate volume of blood received in culture bottles ?Performed at [facility], [place] [address] [place], [place] [address] ?  ? Culture   Final  ?  NO GROWTH 1 DAY ?Performed at [facility], [place] [address] [place], [place] [address] ?  ? Report Status PENDING  Incomplete  ?Culture, blood (Routine X 2) w Reflex to ID Panel     Status: Abnormal (Preliminary result)  ? Collection Time: 09/12/21 10:22 PM  ? Specimen: BLOOD  ?Result Value Ref Range Status  ? Specimen Description   Final  ?  BLOOD LEFT ANTECUBITAL ?Performed at [facility], [place] [address] [place], [place] [address] ?  ? Special Requests   Final  ?  BOTTLES DRAWN AEROBIC AND ANAEROBIC Blood Culture adequate volume ?Performed at [facility], [place] [address] [place], [place] [address] ?  ? Culture  Setup Time   Final  ?  GRAM NEGATIVE RODS ?IN BOTH AEROBIC AND ANAEROBIC BOTTLES ?Organism ID to follow ?CRITICAL RESULT CALLED TO, READ BACK BY AND VERIFIED WITH: C. SHADE PHARMD, AT 4287 09/13/21 D. VANHOOK ?  ? Culture (A)  Final  ?  ESCHERICHIA COLI ?SUSCEPTIBILITIES TO FOLLOW ?Performed at [facility], [place] [address], [place], [place] [address] ?  ? Report Status PENDING  Incomplete  ?Blood Culture ID Panel (Reflexed)     Status: Abnormal  ? Collection Time: 09/12/21 10:22 PM  ?Result Value Ref Range Status  ? Enterococcus faecalis NOT DETECTED NOT DETECTED Final  ? Enterococcus Faecium NOT DETECTED NOT DETECTED Final  ? Listeria monocytogenes  NOT DETECTED NOT DETECTED Final  ? Staphylococcus species NOT DETECTED NOT DETECTED Final  ? Staphylococcus aureus (BCID) NOT DETECTED NOT DETECTED Final  ? Staphylococcus epidermidis NOT DETECTED NOT DETECTED Final  ? Stap

## 2021-09-14 NOTE — Progress Notes (Addendum)
Patient ID: Deanna Shields, female   DOB: 09-14-1928, 88 y.o.   MRN: [id number] ? ? ? Progress Note ? ? Subjective  ? Day # 2 ? CC; altered mental status, nausea vomiting ? ?Rocephin/metronidazole ? ?ERCP yesterday-3 common bile duct stones largest 12 mm, bile duct markedly dilated, status post sphincterotomy, multiple balloon sweeps and removal of 3 CBD stones ? ?Labs pending today ?09/13/2021-WBC 27,000/hemoglobin 13/hematocrit 38.6 ?Potassium 4.2 ?BUN 24/creatinine 2.1 ?T. bili 5.1/alk phos 838/AST 157/ALT 78 ? ?Blood cultures positive for Enterobacter rallies and E. Coli-sensitivities pending. ? ?Patient very alert today, says she is feeling much better and is ready to go home.  Family at bedside ?She was able to eat a small amount of breakfast this morning, admits to mild nausea, no vomiting no complaints of abdominal pain. ? ? Objective  ? ?Vital signs in last 24 hours: ?Temp:  [97.3 ?F (36.3 ?C)-98.4 ?F (36.9 ?C)] 97.3 ?F (36.3 ?C) (04/10 0329) ?Pulse Rate:  [53-86] 76 (04/10 0329) ?Resp:  [13-21] 16 (04/10 0329) ?BP: (119-143)/(45-59) 121/58 (04/10 0329) ?SpO2:  [97 %-100 %] 100 % (04/10 0329) ?Weight:  [85.2 kg] 85.2 kg (04/10 0404) ?Last BM Date : 09/13/21 ?General:   Elderly white female in NAD ?Heart:  Regular rate and rhythm; no murmurs ?Lungs: Respirations even and unlabored, lungs CTA bilaterally ?Abdomen:  Soft, nontender and nondistended. Normal bowel sounds. ?Extremities:  Without edema. ?Neurologic:  Alert and oriented,  grossly normal neurologically. ?Psych:  Cooperative. Normal mood and affect. ? ?Intake/Output from previous day: ?04/09 0701 - 04/10 0700 ?In: 1061.7 [P.O.:480; I.V.:581.7] ?Out: -  ?Intake/Output this shift: ?No intake/output data recorded. ? ?Lab Results: ?Recent Labs  ?  09/12/21 ?2108 09/13/21 ?0443  ?WBC 27.6* 27.0*  ?HGB 14.8 13.0  ?HCT 43.9 38.6  ?PLT 251 227  ? ?BMET ?Recent Labs  ?  09/12/21 ?2108 09/13/21 ?0443  ?NA 138 139  ?K 3.2* 4.2  ?CL 101 103  ?CO2 23 25  ?GLUCOSE  106* 108*  ?BUN 24* 24*  ?CREATININE 2.20* 2.10*  ?CALCIUM 9.2 8.6*  ? ?LFT ?Recent Labs  ?  09/13/21 ?0443  ?PROT 5.6*  ?ALBUMIN 2.7*  ?AST 157*  ?ALT 78*  ?ALKPHOS 838*  ?BILITOT 5.1*  ?BILIDIR 3.3*  ? ?PT/INR ?Recent Labs  ?  09/13/21 ?0443  ?LABPROT 14.8  ?INR 1.2  ? ? ?Studies/Results: ?CT Abdomen Pelvis Wo Contrast ? ?Result Date: 09/12/2021 ?CLINICAL DATA:  Nausea/vomiting. unwitnessed fall at facility. Was found face down with vomit around her. EXAM: CT ABDOMEN AND PELVIS WITHOUT CONTRAST TECHNIQUE: Multidetector CT imaging of the abdomen and pelvis was performed following the standard protocol without IV contrast. RADIATION DOSE REDUCTION: This exam was performed according to the departmental dose-optimization program which includes automated exposure control, adjustment of the mA and/or kV according to patient size and/or use of iterative reconstruction technique. COMPARISON:  None. FINDINGS: Slightly limited evaluation due to motion artifact. Lower chest: No acute abnormality.  Tiny hiatal hernia. Liver: Mildly enlarged measuring up to 18 cm.  No focal lesion. Biliary System: Status post cholecystectomy. Enlarged common bile duct measuring up to 1.4 cm with associated intraluminal hyperdensities measuring approximately and 0.70.8 cm. Pancreas: Diffusely atrophic. No focal lesion. Query slight fat stranding along the proximal pancreas (2:31). No main pancreatic ductal dilatation. Spleen: Not enlarged. No focal lesion. Adrenal Glands: No nodularity bilaterally. Kidneys: No hydroureteronephrosis. No nephroureterolithiasis. No contour deforming renal mass. Anterior urinary bladder diverticula. Otherwise the urinary bladder is unremarkable. Bowel: No small or  large bowel wall thickening or dilatation. Status post appendectomy. Mesentery, Omentum, and Peritoneum: No simple free fluid ascites. No pneumoperitoneum. No mesenteric hematoma identified. No organized fluid collection. Pelvic Organs: Status post  hysterectomy. Bilateral adnexal regions are unremarkable. Lymph Nodes: No abdominal, pelvic, inguinal lymphadenopathy. Vasculature: Severe atherosclerotic plaque. No abdominal aorta or iliac aneurysm. Musculoskeletal: No significant soft tissue hematoma. Small to moderate umbilical hernia containing fat with abdominal wall defect of 3.3 cm. No acute pelvic fracture. No spinal fracture. Multilevel degenerative changes spine. IMPRESSION: 1. Findings suggestive of choledocholithiasis in a patient status post cholecystectomy. Correlate with liver function tests. 2. Query slight fat stranding along the proximal pancreas with limited evaluation due to motion artifact. Correlate with lipase levels. 3. No acute traumatic injury to the abdomen or pelvis on this noncontrast study. 4. No acute fracture or traumatic malalignment of the lumbar spine. Other imaging findings of potential clinical significance: 1. Small hiatal hernia. 2. Fat containing umbilical hernia. A findings suggestive ischemia or bowel obstruction. 3. Aortic Atherosclerosis (ICD10-I70.0). 4. Status post cholecystectomy, appendectomy, hysterectomy. Electronically Signed   By: Iven Finn M.D.   On: 09/12/2021 23:37  ? ?CT Head Wo Contrast ? ?Result Date: 09/12/2021 ?CLINICAL DATA:  Head trauma, minor (Age >= 65y); Polytrauma, blunt. Unwitnessed fall EXAM: CT HEAD WITHOUT CONTRAST CT CERVICAL SPINE WITHOUT CONTRAST TECHNIQUE: Multidetector CT imaging of the head and cervical spine was performed following the standard protocol without intravenous contrast. Multiplanar CT image reconstructions of the cervical spine were also generated. RADIATION DOSE REDUCTION: This exam was performed according to the departmental dose-optimization program which includes automated exposure control, adjustment of the mA and/or kV according to patient size and/or use of iterative reconstruction technique. COMPARISON:  None. FINDINGS: CT HEAD FINDINGS BRAIN: BRAIN Cerebral  ventricle sizes are concordant with the degree of cerebral volume loss. Patchy and confluent areas of decreased attenuation are noted throughout the deep and periventricular white matter of the cerebral hemispheres bilaterally, compatible with chronic microvascular ischemic disease. No evidence of large-territorial acute infarction. No parenchymal hemorrhage. No mass lesion. No extra-axial collection. No mass effect or midline shift. No hydrocephalus. Basilar cisterns are patent. Vascular: No hyperdense vessel. Atherosclerotic calcifications are present within the cavernous internal carotid arteries. Skull: No acute fracture or focal lesion. Sinuses/Orbits: Trace mucosal thickening of the right maxillary sinus. Otherwise paranasal sinuses and mastoid air cells are clear. Bilateral lens replacement. Otherwise the orbits are unremarkable. Other: None. CT CERVICAL SPINE FINDINGS Alignment: Normal. Skull base and vertebrae: Multilevel moderate degenerative changes spine. Severe osseous neural foraminal stenosis at the C4-C5 level. No acute fracture. No aggressive appearing focal osseous lesion or focal pathologic process. Soft tissues and spinal canal: No prevertebral fluid or swelling. No visible canal hematoma. Upper chest: Unremarkable. Other: None. IMPRESSION: 1. No acute intracranial abnormality. 2. No acute displaced fracture or traumatic listhesis of the cervical spine. Electronically Signed   By: Iven Finn M.D.   On: 09/12/2021 23:25  ? ?CT Cervical Spine Wo Contrast ? ?Result Date: 09/12/2021 ?CLINICAL DATA:  Head trauma, minor (Age >= 65y); Polytrauma, blunt. Unwitnessed fall EXAM: CT HEAD WITHOUT CONTRAST CT CERVICAL SPINE WITHOUT CONTRAST TECHNIQUE: Multidetector CT imaging of the head and cervical spine was performed following the standard protocol without intravenous contrast. Multiplanar CT image reconstructions of the cervical spine were also generated. RADIATION DOSE REDUCTION: This exam was  performed according to the departmental dose-optimization program which includes automated exposure control, adjustment of the mA and/or kV according to patient size and/or  use of iterative reconstruction technique. COMPAR

## 2021-09-15 DIAGNOSIS — R7989 Other specified abnormal findings of blood chemistry: Secondary | ICD-10-CM

## 2021-09-15 DIAGNOSIS — B9620 Unspecified Escherichia coli [E. coli] as the cause of diseases classified elsewhere: Secondary | ICD-10-CM

## 2021-09-15 DIAGNOSIS — N184 Chronic kidney disease, stage 4 (severe): Secondary | ICD-10-CM

## 2021-09-15 DIAGNOSIS — W19XXXA Unspecified fall, initial encounter: Secondary | ICD-10-CM

## 2021-09-15 DIAGNOSIS — D72829 Elevated white blood cell count, unspecified: Secondary | ICD-10-CM

## 2021-09-15 DIAGNOSIS — R7881 Bacteremia: Secondary | ICD-10-CM

## 2021-09-15 DIAGNOSIS — K8050 Calculus of bile duct without cholangitis or cholecystitis without obstruction: Secondary | ICD-10-CM | POA: Diagnosis not present

## 2021-09-15 LAB — COMPREHENSIVE METABOLIC PANEL
ALT: 41 U/L (ref 0–44)
AST: 41 U/L (ref 15–41)
Albumin: 2.5 g/dL — ABNORMAL LOW (ref 3.5–5.0)
Alkaline Phosphatase: 551 U/L — ABNORMAL HIGH (ref 38–126)
Anion gap: 6 (ref 5–15)
BUN: 30 mg/dL — ABNORMAL HIGH (ref 8–23)
CO2: 17 mmol/L — ABNORMAL LOW (ref 22–32)
Calcium: 7.6 mg/dL — ABNORMAL LOW (ref 8.9–10.3)
Chloride: 112 mmol/L — ABNORMAL HIGH (ref 98–111)
Creatinine, Ser: 2.21 mg/dL — ABNORMAL HIGH (ref 0.44–1.00)
GFR, Estimated: 21 mL/min — ABNORMAL LOW (ref >60)
Glucose, Bld: 108 mg/dL — ABNORMAL HIGH (ref 70–99)
Potassium: 3.9 mmol/L (ref 3.5–5.1)
Sodium: 135 mmol/L (ref 135–145)
Total Bilirubin: 1.1 mg/dL (ref 0.3–1.2)
Total Protein: 5.3 g/dL — ABNORMAL LOW (ref 6.5–8.1)

## 2021-09-15 LAB — CBC WITH DIFFERENTIAL/PLATELET
Abs Immature Granulocytes: 0.13 10*3/uL — ABNORMAL HIGH (ref 0.00–0.07)
Basophils Absolute: 0 10*3/uL (ref 0.0–0.1)
Basophils Relative: 0 %
Eosinophils Absolute: 0 10*3/uL (ref 0.0–0.5)
Eosinophils Relative: 0 %
HCT: 36.2 % (ref 36.0–46.0)
Hemoglobin: 12.1 g/dL (ref 12.0–15.0)
Immature Granulocytes: 1 %
Lymphocytes Relative: 12 %
Lymphs Abs: 2 10*3/uL (ref 0.7–4.0)
MCH: 33.2 pg (ref 26.0–34.0)
MCHC: 33.4 g/dL (ref 30.0–36.0)
MCV: 99.5 fL (ref 80.0–100.0)
Monocytes Absolute: 0.6 10*3/uL (ref 0.1–1.0)
Monocytes Relative: 3 %
Neutro Abs: 13.9 10*3/uL — ABNORMAL HIGH (ref 1.7–7.7)
Neutrophils Relative %: 84 %
Platelets: 208 10*3/uL (ref 150–400)
RBC: 3.64 MIL/uL — ABNORMAL LOW (ref 3.87–5.11)
RDW: 14.7 % (ref 11.5–15.5)
WBC: 16.6 10*3/uL — ABNORMAL HIGH (ref 4.0–10.5)
nRBC: 0 % (ref 0.0–0.2)

## 2021-09-15 LAB — CULTURE, BLOOD (ROUTINE X 2): Special Requests: ADEQUATE

## 2021-09-15 LAB — METHYLMALONIC ACID, SERUM: Methylmalonic Acid, Quantitative: 168 nmol/L (ref 0–378)

## 2021-09-15 MED ORDER — HEPARIN SODIUM (PORCINE) 5000 UNIT/ML IJ SOLN
5000.0000 [IU] | Freq: Three times a day (TID) | INTRAMUSCULAR | Status: DC
  Administered 2021-09-15 – 2021-09-16 (×3): 5000 [IU] via SUBCUTANEOUS
  Filled 2021-09-15 (×3): qty 1

## 2021-09-15 MED ORDER — GUAIFENESIN-DM 100-10 MG/5ML PO SYRP
5.0000 mL | ORAL_SOLUTION | ORAL | Status: DC | PRN
  Administered 2021-09-15: 5 mL via ORAL
  Filled 2021-09-15: qty 5

## 2021-09-15 MED ORDER — MENTHOL 3 MG MT LOZG
1.0000 | LOZENGE | OROMUCOSAL | Status: DC | PRN
  Administered 2021-09-15: 3 mg via ORAL
  Filled 2021-09-15: qty 9

## 2021-09-15 MED ORDER — AMOXICILLIN-POT CLAVULANATE 500-125 MG PO TABS
1.0000 | ORAL_TABLET | Freq: Two times a day (BID) | ORAL | Status: DC
  Administered 2021-09-16: 500 mg via ORAL
  Filled 2021-09-15: qty 1

## 2021-09-15 MED ORDER — SODIUM CHLORIDE 0.9 % IV SOLN
3.0000 g | Freq: Two times a day (BID) | INTRAVENOUS | Status: AC
  Administered 2021-09-15 (×2): 3 g via INTRAVENOUS
  Filled 2021-09-15 (×2): qty 8

## 2021-09-15 MED ORDER — LACTATED RINGERS IV SOLN: INTRAVENOUS | Status: DC

## 2021-09-15 MED ORDER — SODIUM BICARBONATE 650 MG PO TABS
1300.0000 mg | ORAL_TABLET | Freq: Two times a day (BID) | ORAL | Status: DC
  Administered 2021-09-15 – 2021-09-16 (×3): 1300 mg via ORAL
  Filled 2021-09-15 (×3): qty 2

## 2021-09-15 MED ORDER — ENSURE ENLIVE PO LIQD
237.0000 mL | Freq: Three times a day (TID) | ORAL | Status: DC
  Administered 2021-09-16: 237 mL via ORAL

## 2021-09-15 MED ORDER — ALBUMIN HUMAN 25 % IV SOLN
12.5000 g | Freq: Once | INTRAVENOUS | Status: AC
  Administered 2021-09-15: 12.5 g via INTRAVENOUS
  Filled 2021-09-15: qty 50

## 2021-09-15 MED ORDER — SODIUM CHLORIDE 0.9 % IV SOLN: INTRAVENOUS | Status: DC

## 2021-09-15 NOTE — Progress Notes (Signed)
? ? ? ? ? ?  Date: 09/15/2021 ? ?Patient name: Deanna Shields  ?Medical record number: [id number]  ?Date of birth: 26-Jul-1928  ? ?Patient has ampicillin sensitive E. coli in blood from biliary tree. ? ?I am narrowing her to Unasyn formal consult this afternoon ? ?Rhina Brackett [place] ?09/15/2021, 8:17 AM ? ? ?

## 2021-09-15 NOTE — Progress Notes (Signed)
?PROGRESS NOTE ? ? ? ?Deanna Shields  TGY:[id number] DOB: Oct 15, 1928 DOA: 09/12/2021 ?PCP: [name], MD  ?Outpatient Specialists:  ? ? ? ?Brief Narrative:  ?Patient is a 88 year old Caucasian female past medical history significant for laparoscopic cholecystectomy in 2016, hypertension, chronic kidney disease stage III and hyperlipidemia.  She is a resident of an assisted living facility.  Patient was admitted with altered mental status, worsening renal function, choledocholithiasis and ascending cholangitis.  Patient has been seen by GI team.  Patient has undergone ERCP.  3 common bile duct stones were found and removed.  ? ?Assessment & Plan: ? ?Choledocholithiasis/ascending cholangitis/severe sepsis with organ failure: ?-Patient has undergone ERCP and stone removal. ?-Continue IV antibiotics, now on Unasyn. ID following ? ?E. coli bacteremia: ?Currently on Unasyn.  Plan to discharge on oral Augmentin.  ID following. Repeat blood cultures ordered for tomorrow. ? ?Acute kidney injury on chronic kidney disease stage IIIA/B: ?-Acute kidney injury secondary to ATN. Baseline Cr appears to be around 1.3 ?-Creatinine improving. ?-Good urine output. Non-oliguric. Consult nephrology if she becomes oliguric ?-Avoid nephrotoxins. ?-1 bag of albumin today ? ?Metabolic acidosis ?-start sodium bicarb tabs ? ?Hypertension: ?-Continue to optimize.   ? ?Acute metabolic encephalopathy: ?-Combined metabolic/sepsis and some component of hospital delirium ?-Stable ? ?DVT prophylaxis: heparin subQ ?Code Status: DNR ?Family Communication: Niece/healthcare power of attorney ?Disposition Plan: Assisted living facility ? ? ?Consultants:  ?GI, ID ? ?Procedures:  ?ERCP ? ?Subjective: ?Still somewhat confused. Daughter present at bedside. Can tell me her name and where she is. Pain well controlled.  ? ?Objective: ?Vitals:  ? 09/14/21 2013 09/15/21 0405 09/15/21 0500 09/15/21 1446  ?BP: (!) 114/96 136/87  139/61  ?Pulse: 65 77  (!) 59   ?Resp: '18 16  19  '$ ?Temp: 98 ?F (36.7 ?C) 98.9 ?F (37.2 ?C)  98 ?F (36.7 ?C)  ?TempSrc:    Oral  ?SpO2: 100% 99%  100%  ?Weight:   83.3 kg   ?Height:      ? ? ?Intake/Output Summary (Last 24 hours) at 09/15/2021 1750 ?Last data filed at 09/15/2021 1600 ?Gross per 24 hour  ?Intake 4859.49 ml  ?Output 1050 ml  ?Net 3809.49 ml  ? ? ?Filed Weights  ? 09/13/21 0500 09/14/21 0404 09/15/21 0500  ?Weight: 81.8 kg 85.2 kg 83.3 kg  ? ? ?Examination: ? ?General exam: Appears calm and comfortable ?HEENT: Pale. ?Neck: Supple.  No raised JVD. ?Respiratory system: Clear to auscultation. Respiratory effort normal. ?Cardiovascular system: S1 & S2  ?Gastrointestinal system: Abdomen is nondistended, soft and nontender. No organomegaly or masses felt. Normal bowel sounds heard. ?Central nervous system: Awake and alert.  Moves all extremities.   ?Extremities: No leg edema. ? ?Data Reviewed: I have personally reviewed following labs and imaging studies ? ?CBC: ?Recent Labs  ?Lab 09/12/21 ?2108 09/13/21 ?0443 09/14/21 ?0908 09/15/21 ?2876  ?WBC 27.6* 27.0* 15.1* 16.6*  ?NEUTROABS 24.8* 23.1* 13.7* 13.9*  ?HGB 14.8 13.0 13.0 12.1  ?HCT 43.9 38.6 40.8 36.2  ?MCV 97.1 98.0 103.0* 99.5  ?PLT 251 227 225 208  ? ? ?Basic Metabolic Panel: ?Recent Labs  ?Lab 09/12/21 ?2108 09/13/21 ?0443 09/14/21 ?0908 09/15/21 ?8115  ?NA 138 139 137 135  ?K 3.2* 4.2 4.3 3.9  ?CL 101 103 110 112*  ?CO2 23 25 17* 17*  ?GLUCOSE 106* 108* 161* 108*  ?BUN 24* 24* 33* 30*  ?CREATININE 2.20* 2.10* 2.54* 2.21*  ?CALCIUM 9.2 8.6* 7.6* 7.6*  ?MG 2.5* 2.3  --   --   ? ? ?  GFR: ?Estimated Creatinine Clearance: 18.4 mL/min (A) (by C-G formula based on SCr of 2.21 mg/dL (H)). ?Liver Function Tests: ?Recent Labs  ?Lab 09/12/21 ?2108 09/13/21 ?0443 09/14/21 ?0908 09/15/21 ?0786  ?AST 148* 157* 69* 41  ?ALT 76* 78* 58* 41  ?ALKPHOS 1,048* 838* 672* 551*  ?BILITOT 5.4* 5.1* 1.5* 1.1  ?PROT 6.9 5.6* 5.5* 5.3*  ?ALBUMIN 3.3* 2.7* 2.6* 2.5*  ? ? ?Recent Labs  ?Lab 09/12/21 ?2108   ?LIPASE 36  ? ? ?Recent Labs  ?Lab 09/12/21 ?2258  ?AMMONIA 46*  ? ? ?Coagulation Profile: ?Recent Labs  ?Lab 09/13/21 ?7544  ?INR 1.2  ? ? ?Cardiac Enzymes: ?Recent Labs  ?Lab 09/12/21 ?2108  ?CKTOTAL 50  ? ? ?BNP (last 3 results) ?No results for input(s): PROBNP in the last 8760 hours. ?HbA1C: ?No results for input(s): HGBA1C in the last 72 hours. ?CBG: ?No results for input(s): GLUCAP in the last 168 hours. ?Lipid Profile: ?No results for input(s): CHOL, HDL, LDLCALC, TRIG, CHOLHDL, LDLDIRECT in the last 72 hours. ?Thyroid Function Tests: ?Recent Labs  ?  09/13/21 ?0443  ?TSH 1.313  ? ? ?Anemia Panel: ?No results for input(s): VITAMINB12, FOLATE, FERRITIN, TIBC, IRON, RETICCTPCT in the last 72 hours. ?Urine analysis: ?   ?Component Value Date/Time  ? COLORURINE YELLOW 06/27/2017 0047  ? APPEARANCEUR CLEAR 06/27/2017 0047  ? LABSPEC 1.009 06/27/2017 0047  ? PHURINE 5.0 06/27/2017 0047  ? GLUCOSEU NEGATIVE 06/27/2017 0047  ? HGBUR SMALL (A) 06/27/2017 0047  ? HGBUR negative 06/04/2008 1606  ? BILIRUBINUR small 04/03/2020 1203  ? KETONESUR negative 04/17/2019 1533  ? [place] NEGATIVE 06/27/2017 0047  ? PROTEINUR Positive (A) 04/03/2020 1203  ? [place] NEGATIVE 06/27/2017 0047  ? UROBILINOGEN 0.2 04/03/2020 1203  ? UROBILINOGEN 0.2 10/28/2013 2000  ? NITRITE positive 04/03/2020 1203  ? NITRITE NEGATIVE 06/27/2017 0047  ? LEUKOCYTESUR Negative 04/03/2020 1203  ? ?Sepsis Labs: ?'@LABRCNTIP'$ (procalcitonin:4,lacticidven:4) ? ?) ?Recent Results (from the past 240 hour(s))  ?Culture, blood (Routine X 2) w Reflex to ID Panel     Status: None (Preliminary result)  ? Collection Time: 09/12/21  2:18 AM  ? Specimen: BLOOD  ?Result Value Ref Range Status  ? Specimen Description   Final  ?  BLOOD BLOOD RIGHT HAND ?Performed at [facility], [place] [address], [place], [place] [address] ?  ? Special Requests   Final  ?  BOTTLES DRAWN AEROBIC AND ANAEROBIC Blood Culture results may not be optimal due to an  inadequate volume of blood received in culture bottles ?Performed at [facility], [place] [address], [place], [place] [address] ?  ? Culture   Final  ?  NO GROWTH 2 DAYS ?Performed at [facility], [place] [address], [place], [place] [address] ?  ? Report Status PENDING  Incomplete  ?Culture, blood (Routine X 2) w Reflex to ID Panel     Status: Abnormal  ? Collection Time: 09/12/21 10:22 PM  ? Specimen: BLOOD  ?Result Value Ref Range Status  ? Specimen Description   Final  ?  BLOOD LEFT ANTECUBITAL ?Performed at [facility], [place] [address] [place], [place] [address] ?  ? Special Requests   Final  ?  BOTTLES DRAWN AEROBIC AND ANAEROBIC Blood Culture adequate volume ?Performed at [facility], [place] [address] [place], [place] [address] ?  ? Culture  Setup Time   Final  ?  GRAM NEGATIVE RODS ?IN BOTH AEROBIC AND ANAEROBIC BOTTLES ?Organism ID  to follow ?CRITICAL RESULT CALLED TO, READ BACK BY AND VERIFIED WITH: C. SHADE PHARMD, AT 5009 09/13/21 D. VANHOOK ?Performed at [facility], [place] [address], [place], [place] [address] ?  ? Culture ESCHERICHIA COLI (A)  Final  ? Report Status 09/15/2021 FINAL  Final  ? Organism ID, Bacteria ESCHERICHIA COLI  Final  ?    Susceptibility  ? Escherichia coli - MIC*  ?  AMPICILLIN <=2 SENSITIVE Sensitive   ?  CEFAZOLIN <=4 SENSITIVE Sensitive   ?  CEFEPIME <=0.12 SENSITIVE Sensitive   ?  CEFTAZIDIME <=1 SENSITIVE Sensitive   ?  CEFTRIAXONE <=0.25 SENSITIVE Sensitive   ?  CIPROFLOXACIN <=0.25 SENSITIVE Sensitive   ?  GENTAMICIN <=1 SENSITIVE Sensitive   ?  IMIPENEM <=0.25 SENSITIVE Sensitive   ?  TRIMETH/SULFA <=20 SENSITIVE Sensitive   ?  AMPICILLIN/SULBACTAM <=2 SENSITIVE Sensitive   ?  PIP/TAZO <=4 SENSITIVE Sensitive   ?  * ESCHERICHIA COLI  ?Blood Culture ID Panel (Reflexed)     Status: Abnormal  ? Collection Time: 09/12/21 10:22 PM  ?Result Value Ref Range Status  ? Enterococcus faecalis NOT DETECTED NOT DETECTED  Final  ? Enterococcus Faecium NOT DETECTED NOT DETECTED Final  ? Listeria monocytogenes NOT DETECTED NOT DETECTED Final  ? Staphylococcus species NOT DETECTED NOT DETECTED Final  ? Staphylococcus aureus (BCID) NOT DET

## 2021-09-15 NOTE — Consult Note (Signed)
? ? ? ?Date of Admission:  09/12/2021    ?      ?Reason for Consult: E coli bacteremia    ?Referring Provider:  Einar Crow, MD  ? ? ?Assessment: ? ?E coli bacteremia with severe sepsis from biliary status post ERCP ?With encephalopathy which is improving ?Acute kidney injury  ? ?Plan: ? ?We have narrowed to Unasyn and as the patient is able to take more oral therapy would switch her over to Augmentin to complete a total of 7 days of antibiotics (4 additional days including tomorrow ?Would avoid indomethacin other nephrotoxic drugs ? ?Principal Problem: ?  Choledocholithiasis ?Active Problems: ?  Hypertension ?  HLD (hyperlipidemia) ?  Hypokalemia ?  Severe sepsis ([place]) ?  Acute metabolic encephalopathy ?  AKI (acute kidney injury) ([place]) ?  GERD (gastroesophageal reflux disease) ? ? ?Scheduled Meds: ? [START ON 09/16/2021] amoxicillin-clavulanate  1 tablet Oral BID  ? feeding supplement  237 mL Oral TID BM  ? heparin injection (subcutaneous)  5,000 Units Subcutaneous Q8H  ? melatonin  3 mg Oral QHS  ? pantoprazole  40 mg Oral Daily  ? sodium bicarbonate  1,300 mg Oral BID  ? ?Continuous Infusions: ? sodium chloride 75 mL/hr at 09/15/21 1038  ? ampicillin-sulbactam (UNASYN) IV 3 g (09/15/21 1029)  ? ?PRN Meds:.acetaminophen **OR** acetaminophen, fentaNYL (SUBLIMAZE) injection, guaiFENesin-dextromethorphan, menthol-cetylpyridinium, naLOXone (NARCAN)  injection, ondansetron (ZOFRAN) IV, polyvinyl alcohol ? ?HPI: Deanna Shields is a 88 y.o. female who has a history of prior cholecystectomy in August 2016 presented with abrupt confusion along with several episodes of nonbloody nonbilious emesis. ? ?The patient herself tells me that she remembers mowing her lawn and then the next thing she remembers was she was in the ambulance.  She was encephalopathic on arrival. ? ?She apparently been eating less and drinking less over the days prior to admission.  Also had fallen at her skilled nursing facility as well. ? ?She was  brought via EMS to [facility] long emergency department.  Blood work taken showing significant leukocytosis and acute kidney injury. ? ?Blood cultures were taken CT scan was performed showing enlarged common bile duct with intraluminal hyperdensities concerning for choledocholithiasis. ? ?Her blood cultures later turned positive for E. coli. ? ?She underwent ERCP with extraction of the stone. ? ?She is improved dramatically and is now much more comfortable and asking when she can go home. ? ?Her E. coli is fortunately quite sensitive and we have narrowed to Unasyn.  She should be able to switch over to oral Augmentin as she takes more p.o. pills and can complete a total of 7 days of treatment since there is no remaining deep infection. ? ?I spent 81 minutes with the patient including than 50% of the time in face to face counseling of the patient guarding her choledocholithiasis with cholangitis and bacteremia, personally reviewing CT of the abdomen pelvis CT neck CT head updated culture data CBC BMP along with review of medical records in preparation for the visit and during the visit and in coordination of her care. ? ?I will sign off for now. ? ?Please call with further questions. ? ?Review of Systems: ?Review of Systems  ?Unable to perform ROS: Acuity of condition  ? ?Past Medical History:  ?Diagnosis Date  ? Arthritis   ? "right thumb" (01/29/2015)  ? Chronic shoulder pain   ? "between my shoulders" (01/29/2015)  ? Dizziness   ? Esophageal stricture 2009  ? Gallstones   ? GERD (gastroesophageal  reflux disease) 2004  ? Hemorrhoids 2004  ? Hiatal hernia 2004  ? History of blood transfusion 1947  ? "appendix ruptured"  ? HOH (hard of hearing)   ? Hypertension   ? Rhinitis, allergic   ? Syncopal episodes   ? ? ?Social History  ? ?Tobacco Use  ? Smoking status: Never  ? Smokeless tobacco: Never  ?Vaping Use  ? Vaping Use: Never used  ?Substance Use Topics  ? Alcohol use: No  ?  Alcohol/week: 0.0 standard drinks  ? Drug  use: No  ? ? ?Family History  ?Problem Relation Age of Onset  ? Stroke Father 35  ?     light stroke  ? Heart disease Sister   ? COPD Sister   ? Heart disease Brother   ? Coronary artery disease Neg Hx   ? Diabetes Neg Hx   ? Cancer Neg Hx   ?     breast, colon, prostate  ? ?Allergies  ?Allergen Reactions  ? Contrast Media [Iodinated Contrast Media] Itching and Rash  ?  Pt covered in rash, red.  ? Codeine Nausea And Vomiting  ?  in large doses: can tolerate Tussionex  ? ? ?OBJECTIVE: ?Blood pressure 136/87, pulse 77, temperature 98.9 ?F (37.2 ?C), resp. rate 16, height '5\' 6"'$  (1.676 m), weight 83.3 kg, SpO2 99 %. ? ?Physical Exam ?Constitutional:   ?   General: She is not in acute distress. ?   Appearance: Normal appearance. She is well-developed. She is not ill-appearing or diaphoretic.  ?HENT:  ?   Head: Normocephalic and atraumatic.  ?   Right Ear: Hearing and external ear normal.  ?   Left Ear: Hearing and external ear normal.  ?   Nose: No nasal deformity or rhinorrhea.  ?Eyes:  ?   General: No scleral icterus. ?   Extraocular Movements: Extraocular movements intact.  ?   Conjunctiva/sclera: Conjunctivae normal.  ?   Right eye: Right conjunctiva is not injected.  ?   Left eye: Left conjunctiva is not injected.  ?   Pupils: Pupils are equal, round, and reactive to light.  ?Neck:  ?   Vascular: No JVD.  ?Cardiovascular:  ?   Rate and Rhythm: Normal rate and regular rhythm.  ?   Heart sounds: Normal heart sounds, S1 normal and S2 normal. No murmur heard. ?  No friction rub. No gallop.  ?Pulmonary:  ?   Effort: Pulmonary effort is normal. No respiratory distress.  ?   Breath sounds: No stridor. No wheezing, rhonchi or rales.  ?Abdominal:  ?   General: Bowel sounds are normal. There is no distension.  ?   Palpations: Abdomen is soft.  ?Musculoskeletal:     ?   General: Normal range of motion.  ?   Right shoulder: Normal.  ?   Left shoulder: Normal.  ?   Cervical back: Normal range of motion and neck supple.  ?    Right hip: Normal.  ?   Left hip: Normal.  ?   Right knee: Normal.  ?   Left knee: Normal.  ?Lymphadenopathy:  ?   Head:  ?   Right side of head: No submandibular, preauricular or posterior auricular adenopathy.  ?   Left side of head: No submandibular, preauricular or posterior auricular adenopathy.  ?   Cervical: No cervical adenopathy.  ?   Right cervical: No superficial or deep cervical adenopathy. ?   Left cervical: No superficial or deep cervical adenopathy.  ?  Skin: ?   General: Skin is warm and dry.  ?   Coloration: Skin is not pale.  ?   Findings: No abrasion, bruising, ecchymosis, erythema, lesion or rash.  ?   Nails: There is no clubbing.  ?Neurological:  ?   General: No focal deficit present.  ?   Mental Status: She is alert and oriented to person, place, and time.  ?Psychiatric:     ?   Attention and Perception: She is attentive.     ?   Mood and Affect: Mood normal.     ?   Speech: Speech normal.     ?   Behavior: Behavior normal. Behavior is cooperative.     ?   Thought Content: Thought content normal.     ?   Judgment: Judgment normal.  ? ? ?Lab Results ?Lab Results  ?Component Value Date  ? WBC 16.6 (H) 09/15/2021  ? HGB 12.1 09/15/2021  ? HCT 36.2 09/15/2021  ? MCV 99.5 09/15/2021  ? PLT 208 09/15/2021  ?  ?Lab Results  ?Component Value Date  ? CREATININE 2.21 (H) 09/15/2021  ? BUN 30 (H) 09/15/2021  ? NA 135 09/15/2021  ? K 3.9 09/15/2021  ? CL 112 (H) 09/15/2021  ? CO2 17 (L) 09/15/2021  ?  ?Lab Results  ?Component Value Date  ? ALT 41 09/15/2021  ? AST 41 09/15/2021  ? GGT 1,084 (H) 09/13/2021  ? ALKPHOS 551 (H) 09/15/2021  ? BILITOT 1.1 09/15/2021  ?  ? ?Microbiology: ?Recent Results (from the past 240 hour(s))  ?Culture, blood (Routine X 2) w Reflex to ID Panel     Status: None (Preliminary result)  ? Collection Time: 09/12/21  2:18 AM  ? Specimen: BLOOD  ?Result Value Ref Range Status  ? Specimen Description   Final  ?  BLOOD BLOOD RIGHT HAND ?Performed at [facility], [place] [address], [place], [place] [address] ?  ? Special Requests   Final  ?  BOTTLES DRAWN AEROBIC AND ANAEROBIC Blood Culture results may not be optimal due to an inadequate volume of blood received in culture

## 2021-09-15 NOTE — Progress Notes (Addendum)
Patient ID: Deanna Shields, female   DOB: Jun 07, 1929, 88 y.o.   MRN: [id number] ? ? ? Progress Note ? ? Subjective  ? Day # 3 ? CC; altered mental status, nausea/vomiting-cholangitis/Enterobacter/E. coli bacteremia ? ?Blood cultures-ampicillin sensitive E. Coli ?ID switching to Unasyn today ? ?WBC 16.6/hemoglobin 12.1/hematocrit 36.2 ?T. bili 1.1/alk phos 551/AST 41/ALT 41-much improved ? ?Patient says she feels fine, and is ready to go home.  Family is at bedside, concerned that she is still confused though very pleasant.  She has not been out of bed to ambulate and has not been eating.  She was not eating well prior to admission by their report. ? ? Objective  ? ?Vital signs in last 24 hours: ?Temp:  [98 ?F (36.7 ?C)-98.9 ?F (37.2 ?C)] 98.9 ?F (37.2 ?C) (04/11 0405) ?Pulse Rate:  [65-77] 77 (04/11 0405) ?Resp:  [16-20] 16 (04/11 0405) ?BP: (114-136)/(58-96) 136/87 (04/11 0405) ?SpO2:  [99 %-100 %] 99 % (04/11 0405) ?Weight:  [83.3 kg] 83.3 kg (04/11 0500) ?Last BM Date : 09/13/21 ?General: Very elderly white female in NAD ?Heart:  Regular rate and rhythm; no murmurs ?Lungs: Respirations even and unlabored, lungs CTA bilaterally ?Abdomen:  Soft, nontender and nondistended. Normal bowel sounds. ?Extremities:  Without edema. ?Neurologic:  Alert and oriented,  grossly normal neurologically. ?Psych:  Cooperative. Normal mood and affect. ? ?Intake/Output from previous day: ?04/10 0701 - 04/11 0700 ?In: 4504.4 [P.O.:530; I.V.:3374.4; IV Piggyback:600] ?Out: 1050 [Urine:1050] ?Intake/Output this shift: ?No intake/output data recorded. ? ?Lab Results: ?Recent Labs  ?  09/13/21 ?0443 09/14/21 ?0908 09/15/21 ?4599  ?WBC 27.0* 15.1* 16.6*  ?HGB 13.0 13.0 12.1  ?HCT 38.6 40.8 36.2  ?PLT 227 225 208  ? ?BMET ?Recent Labs  ?  09/13/21 ?0443 09/14/21 ?0908 09/15/21 ?7741  ?NA 139 137 135  ?K 4.2 4.3 3.9  ?CL 103 110 112*  ?CO2 25 17* 17*  ?GLUCOSE 108* 161* 108*  ?BUN 24* 33* 30*  ?CREATININE 2.10* 2.54* 2.21*  ?CALCIUM 8.6* 7.6*  7.6*  ? ?LFT ?Recent Labs  ?  09/13/21 ?0443 09/14/21 ?0908 09/15/21 ?4239  ?PROT 5.6*   < > 5.3*  ?ALBUMIN 2.7*   < > 2.5*  ?AST 157*   < > 41  ?ALT 78*   < > 41  ?ALKPHOS 838*   < > 551*  ?BILITOT 5.1*   < > 1.1  ?BILIDIR 3.3*  --   --   ? < > = values in this interval not displayed.  ? ?PT/INR ?Recent Labs  ?  09/13/21 ?0443  ?LABPROT 14.8  ?INR 1.2  ? ? ?Studies/Results: ?DG ERCP ? ?Result Date: 09/14/2021 ?CLINICAL DATA:  Choledocholithiasis EXAM: ERCP TECHNIQUE: Multiple spot images obtained with the fluoroscopic device and submitted for interpretation post-procedure. FLUOROSCOPY: Radiation Exposure Index (as provided by the fluoroscopic device): 67.53 mGy Kerma COMPARISON:  CT abdomen/pelvis 09/12/2021 FINDINGS: Total of 6 intraoperative saved images are submitted for review. The images demonstrate a flexible duodenal scope in the descending duodenum with wire cannulation of the common bile duct. Subsequently, balloon occluded cholangiogram is performed demonstrating biliary ductal dilation. Filling defects in the distal common bile duct suggest choledocholithiasis. Subsequent images document sphincterotomy and balloon sweep of the common duct. IMPRESSION: 1. Choledocholithiasis. 2. ERCP with sphincterotomy and balloon sweeping of the common duct. These images were submitted for radiologic interpretation only. Please see the procedural report for the amount of contrast and the fluoroscopy time utilized. Electronically Signed   By: Dellis Filbert.D.  On: 09/14/2021 08:03   ? ? ? ? Assessment / Plan:   ? ?#70 88 year old white female admitted with altered mental status, nausea vomiting/sepsis found secondary to E. coli from biliary obstruction/cholangitis ? ?She is status post ERCP with stone extraction x3 on 09/13/2021 ? ?Currently on penicillin G IV ?Infectious disease to see today to advise regarding duration of treatment IV/p.o. ? ?Parameters improved though still has leukocytosis, LFTs much  improved ? ?#2 persistent confusion-metabolic encephalopathy likely secondary to acute illness ?#3 acute kidney injury improved ? ?Plan; start protein shakes 3 times daily ?Encourage p.o. intake/diet as tolerated ?GI will sign off, available for questions, will defer to infectious disease regarding antibiotic management. ? ? ? ?Principal Problem: ?  Choledocholithiasis ?Active Problems: ?  Hypertension ?  HLD (hyperlipidemia) ?  Hypokalemia ?  Severe sepsis ([place]) ?  Acute metabolic encephalopathy ?  AKI (acute kidney injury) ([place]) ?  GERD (gastroesophageal reflux disease) ? ? ? ? LOS: 2 days  ? ?Amy Esterwood PA-C 09/15/2021, 8:45 AM ? ?GI ATTENDING ? ?Interval history data reviewed.  Agree with interval progress note as outlined above.  Plans as outlined above.  We will sign off but available if needed. ? ?Docia Chuck. Geri Seminole., M.D. ?[place] ?Division of Gastroenterology  ?  ?

## 2021-09-16 DIAGNOSIS — R2689 Other abnormalities of gait and mobility: Secondary | ICD-10-CM | POA: Diagnosis not present

## 2021-09-16 DIAGNOSIS — K8309 Other cholangitis: Secondary | ICD-10-CM | POA: Diagnosis not present

## 2021-09-16 DIAGNOSIS — K8050 Calculus of bile duct without cholangitis or cholecystitis without obstruction: Secondary | ICD-10-CM | POA: Diagnosis not present

## 2021-09-16 DIAGNOSIS — Z9181 History of falling: Secondary | ICD-10-CM | POA: Diagnosis not present

## 2021-09-16 DIAGNOSIS — R296 Repeated falls: Secondary | ICD-10-CM | POA: Diagnosis not present

## 2021-09-16 DIAGNOSIS — R29898 Other symptoms and signs involving the musculoskeletal system: Secondary | ICD-10-CM | POA: Diagnosis not present

## 2021-09-16 DIAGNOSIS — M6281 Muscle weakness (generalized): Secondary | ICD-10-CM | POA: Diagnosis not present

## 2021-09-16 DIAGNOSIS — R6520 Severe sepsis without septic shock: Secondary | ICD-10-CM | POA: Diagnosis not present

## 2021-09-16 DIAGNOSIS — R41841 Cognitive communication deficit: Secondary | ICD-10-CM | POA: Diagnosis not present

## 2021-09-16 LAB — CBC WITH DIFFERENTIAL/PLATELET
Abs Immature Granulocytes: 0.05 10*3/uL (ref 0.00–0.07)
Basophils Absolute: 0 10*3/uL (ref 0.0–0.1)
Basophils Relative: 0 %
Eosinophils Absolute: 0.1 10*3/uL (ref 0.0–0.5)
Eosinophils Relative: 1 %
HCT: 35.6 % — ABNORMAL LOW (ref 36.0–46.0)
Hemoglobin: 11.5 g/dL — ABNORMAL LOW (ref 12.0–15.0)
Immature Granulocytes: 1 %
Lymphocytes Relative: 32 %
Lymphs Abs: 3.2 10*3/uL (ref 0.7–4.0)
MCH: 32.8 pg (ref 26.0–34.0)
MCHC: 32.3 g/dL (ref 30.0–36.0)
MCV: 101.4 fL — ABNORMAL HIGH (ref 80.0–100.0)
Monocytes Absolute: 0.5 10*3/uL (ref 0.1–1.0)
Monocytes Relative: 5 %
Neutro Abs: 5.9 10*3/uL (ref 1.7–7.7)
Neutrophils Relative %: 61 %
Platelets: 205 10*3/uL (ref 150–400)
RBC: 3.51 MIL/uL — ABNORMAL LOW (ref 3.87–5.11)
RDW: 15 % (ref 11.5–15.5)
WBC: 9.8 10*3/uL (ref 4.0–10.5)
nRBC: 0 % (ref 0.0–0.2)

## 2021-09-16 LAB — RENAL FUNCTION PANEL
Albumin: 2.5 g/dL — ABNORMAL LOW (ref 3.5–5.0)
Anion gap: 6 (ref 5–15)
BUN: 28 mg/dL — ABNORMAL HIGH (ref 8–23)
CO2: 19 mmol/L — ABNORMAL LOW (ref 22–32)
Calcium: 8.3 mg/dL — ABNORMAL LOW (ref 8.9–10.3)
Chloride: 117 mmol/L — ABNORMAL HIGH (ref 98–111)
Creatinine, Ser: 1.89 mg/dL — ABNORMAL HIGH (ref 0.44–1.00)
GFR, Estimated: 25 mL/min — ABNORMAL LOW (ref >60)
Glucose, Bld: 81 mg/dL (ref 70–99)
Phosphorus: 3 mg/dL (ref 2.5–4.6)
Potassium: 3.5 mmol/L (ref 3.5–5.1)
Sodium: 142 mmol/L (ref 135–145)

## 2021-09-16 MED ORDER — AMOXICILLIN-POT CLAVULANATE 500-125 MG PO TABS: 1.0000 | ORAL_TABLET | Freq: Two times a day (BID) | ORAL | 0 refills | Status: DC

## 2021-09-16 MED ORDER — AMOXICILLIN-POT CLAVULANATE 500-125 MG PO TABS: 1.0000 | ORAL_TABLET | Freq: Two times a day (BID) | ORAL | 0 refills | Status: AC

## 2021-09-16 NOTE — Evaluation (Signed)
Physical Therapy Evaluation ?Patient Details ?Name: Deanna Shields ?MRN: [id number] ?DOB: [birth year] ?Today's Date: 09/16/2021 ? ?History of Present Illness ? Patient is a 88 year old Caucasian female past medical history significant for laparoscopic cholecystectomy in 2016, hypertension, chronic kidney disease stage III and hyperlipidemia.  She is a resident of an assisted living facility.  Patient was admitted with altered mental status, worsening renal function, choledocholithiasis and ascending cholangitis.  Patient has been seen by GI team.  Patient has undergone ERCP.  3 common bile duct stones were found and removed on 09/13/21. ?  ?Clinical Impression ? Deanna D Fuente is 88 y.o. female admitted with above HPI and diagnosis. Patient is currently limited by functional impairments below (see PT problem list). Patient lives at [facility] and is independent with Rollator at baseline. Currently she requires min guard/supervision for transfers and gait with RW. Patient will benefit from continued skilled PT interventions to address impairments and progress independence with mobility, recommending HHPT at ALF with intermittent assist from staff and family. Acute PT will follow and progress as able.    ?   ? ?Recommendations for follow up therapy are one component of a multi-disciplinary discharge planning process, led by the attending physician.  Recommendations may be updated based on patient status, additional functional criteria and insurance authorization. ? ?Follow Up Recommendations [facility]) ? ?  ?Assistance Recommended at Discharge Intermittent Supervision/Assistance  ?Patient can return home with the following ?   ? ?  ?Equipment Recommendations None recommended by PT  ?Recommendations for Other Services ?    ?  ?Functional Status Assessment Patient has had a recent decline in their functional status and demonstrates the ability to make significant improvements in function in a  reasonable and predictable amount of time.  ? ?  ?Precautions / Restrictions Precautions ?Precautions: Fall ?Restrictions ?Weight Bearing Restrictions: No  ? ?  ? ?Mobility ? Bed Mobility ?  ?  ?  ?  ?  ?  ?  ?  ?  ? ?Transfers ?Overall transfer level: Needs assistance ?Equipment used: Rolling walker (2 wheels) ?Transfers: Sit to/from Stand ?Sit to Stand: Supervision ?  ?  ?  ?  ?  ?General transfer comment: supervision for safety ?  ? ?Ambulation/Gait ?Ambulation/Gait assistance: Min guard ?Gait Distance (Feet): 180 Feet ?Assistive device: Rolling walker (2 wheels) ?Gait Pattern/deviations: Step-through pattern, Decreased stride length, Drifts right/left (decreased hip/knee flexion) ?Gait velocity: decr ?  ?  ?General Gait Details: guarding for safety, pt with overall steady gait, no LOB. slightly shuffled steps with reduced hip elevation and low foot clearance. pt drifting Rt/Lt occasionally. ? ?Stairs ?  ?  ?  ?  ?  ? ?Wheelchair Mobility ?  ? ?Modified Rankin (Stroke Patients Only) ?  ? ?  ? ?Balance Overall balance assessment: Mild deficits observed, not formally tested ?  ?  ?  ?  ?  ?  ?  ?  ?  ?  ?  ?  ?  ?  ?  ?  ?  ?  ?   ? ? ? ?Pertinent Vitals/Pain Pain Assessment ?Pain Assessment: No/denies pain  ? ? ?Home Living Family/patient expects to be discharged to:: Assisted living ?  ?  ?  ?  ?  ?  ?  ?  ?Home Equipment: Rollator (4 wheels) ?Additional Comments: [facility]  ?  ?Prior Function   ?  ?  ?  ?  ?  ?  ?  ?  ?  ? ? ?  Hand Dominance  ?   ? ?  ?Extremity/Trunk Assessment  ? Upper Extremity Assessment ?Upper Extremity Assessment: Overall WFL for tasks assessed ?  ? ?  ?  ? ?   ?Communication  ? Communication: No difficulties  ?Cognition Arousal/Alertness: Awake/alert ?Behavior During Therapy: [facility] for tasks assessed/performed ?Overall Cognitive Status: Within Functional Limits for tasks assessed ?  ?  ?  ?  ?  ?  ?  ?  ?  ?  ?  ?  ?  ?  ?  ?  ?  ?  ?  ? ?  ?General Comments   ? ?  ?Exercises     ? ?Assessment/Plan  ?  ?PT Assessment Patient needs continued PT services  ?PT Problem List Decreased strength;Decreased activity tolerance;Decreased balance;Decreased mobility ? ?   ?  ?PT Treatment Interventions DME instruction;Gait training;Stair training;Functional mobility training;Therapeutic activities;Therapeutic exercise;Balance training;Neuromuscular re-education;Patient/family education   ? ?PT Goals (Current goals can be found in the Care Plan section)  ?Acute Rehab PT Goals ?Patient Stated Goal: get back to ALF ?PT Goal Formulation: With patient ?Time For Goal Achievement: 09/30/21 ?Potential to Achieve Goals: Good ? ?  ?Frequency Min 3X/week ?  ? ? ?Co-evaluation   ?  ?  ?  ?  ? ? ?  ?AM-PAC PT "6 Clicks" Mobility  ?Outcome Measure Help needed turning from your back to your side while in a flat bed without using bedrails?: None ?Help needed moving from lying on your back to sitting on the side of a flat bed without using bedrails?: None ?Help needed moving to and from a bed to a chair (including a wheelchair)?: A Little ?Help needed standing up from a chair using your arms (e.g., wheelchair or bedside chair)?: A Little ?Help needed to walk in hospital room?: A Little ?Help needed climbing 3-5 steps with a railing? : A Little ?6 Click Score: 20 ? ?  ?End of Session Equipment Utilized During Treatment: Gait belt ?Activity Tolerance: Patient tolerated treatment well ?Patient left: in chair;with call bell/phone within reach (on window bench with family) ?Nurse Communication: Mobility status ?PT Visit Diagnosis: Muscle weakness (generalized) (M62.81);Other abnormalities of gait and mobility (R26.89);Difficulty in walking, not elsewhere classified (R26.2) ?  ? ?Time: 2440-1027 ?PT Time Calculation (min) (ACUTE ONLY): 13 min ? ? ?Charges:   PT Evaluation ?$PT Eval Low Complexity: 1 Low ?  ?  ?   ? ? ?Gwynneth Albright PT, DPT ?Acute Rehabilitation Services ?Office [phone] ?Pager [phone]  ? ?Jacques Navy ?09/16/2021, 1:18 PM ? ?

## 2021-09-16 NOTE — Discharge Summary (Signed)
?Physician Discharge Summary ?  ?Patient: Deanna Shields MRN: [id number] DOB: 08-25-1928  ?Admit date:     09/12/2021  ?Discharge date: 09/16/21  ?Discharge Physician: Leslee Home  ? ?PCP: [name], MD  ? ?Recommendations at discharge:  ?1) Repeat BMP by PCP in 1 week ?2) Take Augmentin as prescribed ? ?Discharge Diagnoses: ?Choledocholithiasis/ascending cholangitis status post ERCP and stone removal ?Severe sepsis with failure due to the above ?E. coli bacteremia ?Acute metabolic encephalopathy secondary to the above, resolved ?Acute kidney injury secondary to acute tubular necrosis superimposed on chronic kidney disease stage IIIA/IIIB ?Metabolic acidosis, mild ?Essential hypertension ?Hyperlipidemia ?GERD ?Obesity ? ?Hospital Course: ?Patient is a 88 year old Caucasian female past medical history significant for laparoscopic cholecystectomy in 2016, hypertension, chronic kidney disease stage III and hyperlipidemia.  She is a resident of an assisted living facility.  Patient was admitted with altered mental status, worsening renal function, choledocholithiasis and ascending cholangitis.  Patient has been seen by GI team.  Patient has undergone ERCP.  3 common bile duct stones were found and removed.  Initially met severe sepsis criteria due to acute choledocholithiasis/ascending cholangitis.  Sepsis physiology resolved.  Blood cultures showed E. coli bacteremia.  Infectious disease was consulted and was transitioned to Unasyn and will be discharged on oral Augmentin.  The patient also did have an acute kidney injury with acute tubular necrosis.  Treated with IV fluids and creatinine has improved.  Urine output has picked up.  She is nonoliguric.  Discussed as a curbside with nephrology today and recommend patient staying well-hydrated and repeat BMP by primary care physician in 1 week.  Baseline creatinine appears to be around 1.0-1.3.  Discharge creatinine 1.8.  Her encephalopathy is resolved from her  infection.  Did have some hospital delirium but no agitation.  This should improve once back to assisted living facility.  Follow-up with PCP in 1 week for repeat BM.  Plan of care discussed with daughter present at bedside. ? ?Admitting Diagnosis: ?Choledocholithiasis ?Severe sepsis ?Acute metabolic encephalopathy ?Hypokalemia ?Acute kidney injury ?GERD ?Essential hypertension ?Hyperlipidemia ? ? ? ?Consultants: GI, ID ?Procedures performed: ERCP ?Disposition: Assisted living ?Diet recommendation:  ?Discharge Diet Orders (From admission, onward)  ? ?  Start     Ordered  ? 09/16/21 0000  Diet general       ? 09/16/21 1003  ? ?  ?  ? ?  ? ?Regular diet ?DISCHARGE MEDICATION: ?Allergies as of 09/16/2021   ? ?   Reactions  ? Contrast Media [iodinated Contrast Media] Itching, Rash  ? Pt covered in rash, red.  ? Codeine Nausea And Vomiting  ? in large doses: can tolerate Tussionex  ? ?  ? ?  ?Medication List  ?  ? ?TAKE these medications   ? ?acetaminophen 650 MG suppository ?Commonly known as: TYLENOL ?Place 650 mg rectally every 4 (four) hours as needed for fever. ?  ?alum & mag hydroxide-simeth 032-122-48 MG/5ML suspension ?Commonly known as: MAALOX PLUS ?Take 10 mLs by mouth every 6 (six) hours as needed for indigestion or heartburn. ?  ?amLODipine 2.5 MG tablet ?Commonly known as: NORVASC ?Take 1 tablet (2.5 mg total) by mouth daily. ?  ?amoxicillin-clavulanate 500-125 MG tablet ?Commonly known as: AUGMENTIN ?Take 1 tablet (500 mg total) by mouth 2 (two) times daily for 4 days. Stop date 09/19/21 ?  ?benzonatate 100 MG capsule ?Commonly known as: TESSALON ?Take 1 capsule by mouth 3 (three) times daily. ?  ?ferrous sulfate 325 (65 FE) MG  tablet ?Take 325 mg by mouth. Monday, Wednesday and Fridays ?  ?HM Calcium-Vitamin D 600-20 MG-MCG Tabs ?Generic drug: Calcium Carb-Cholecalciferol ?Take 1 tablet by mouth 2 (two) times daily. ?  ?omeprazole 40 MG capsule ?Commonly known as: PRILOSEC ?Take 40 mg by mouth daily. ?   ?ondansetron 4 MG disintegrating tablet ?Commonly known as: ZOFRAN-ODT ?Take 4 mg by mouth every 6 (six) hours as needed for nausea or vomiting. ?  ?potassium chloride SA 20 MEQ tablet ?Commonly known as: KLOR-CON M ?Take 20 mEq by mouth 2 (two) times daily. ?  ?rosuvastatin 20 MG tablet ?Commonly known as: CRESTOR ?Take 1 tablet (20 mg total) by mouth daily. ?  ?vitamin B-12 1000 MCG tablet ?Commonly known as: CYANOCOBALAMIN ?Take 1,000 mcg by mouth daily. ?  ?Vitamin D3 50 MCG (2000 UT) Tabs ?Take 1 capsule by mouth daily. ?  ? ?  ? ? Follow-up Information   ? ? [name], MD. Schedule an appointment as soon as possible for a visit in 1 week(s).   ?Specialty: Internal Medicine ?Why: 1) Repeat BMP ?Contact information: ?[address] ?[place] [address] ?[phone] ? ? ?  ?  ? ?  ?  ? ?  ? ?Discharge Exam: ?Filed Weights  ? 09/14/21 0404 09/15/21 0500 09/16/21 0422  ?Weight: 85.2 kg 83.3 kg 91.1 kg  ? ?General exam: Appears calm and comfortable, hard of hearing ?Respiratory system: Clear to auscultation. Respiratory effort normal. ?Cardiovascular system: S1 & S2  ?Gastrointestinal system: Abdomen is nondistended, soft and nontender. No organomegaly or masses felt. Normal bowel sounds heard. ?Central nervous system: Awake and alert.  Moves all extremities.   ?Extremities: No leg edema. ?  ? ?Condition at discharge: good ? ?The results of significant diagnostics from this hospitalization (including imaging, microbiology, ancillary and laboratory) are listed below for reference.  ? ?Imaging Studies: ?CT Abdomen Pelvis Wo Contrast ? ?Result Date: 09/12/2021 ?CLINICAL DATA:  Nausea/vomiting. unwitnessed fall at facility. Was found face down with vomit around her. EXAM: CT ABDOMEN AND PELVIS WITHOUT CONTRAST TECHNIQUE: Multidetector CT imaging of the abdomen and pelvis was performed following the standard protocol without IV contrast. RADIATION DOSE REDUCTION: This exam was performed according to the  departmental dose-optimization program which includes automated exposure control, adjustment of the mA and/or kV according to patient size and/or use of iterative reconstruction technique. COMPARISON:  None. FINDINGS: Slightly limited evaluation due to motion artifact. Lower chest: No acute abnormality.  Tiny hiatal hernia. Liver: Mildly enlarged measuring up to 18 cm.  No focal lesion. Biliary System: Status post cholecystectomy. Enlarged common bile duct measuring up to 1.4 cm with associated intraluminal hyperdensities measuring approximately and 0.70.8 cm. Pancreas: Diffusely atrophic. No focal lesion. Query slight fat stranding along the proximal pancreas (2:31). No main pancreatic ductal dilatation. Spleen: Not enlarged. No focal lesion. Adrenal Glands: No nodularity bilaterally. Kidneys: No hydroureteronephrosis. No nephroureterolithiasis. No contour deforming renal mass. Anterior urinary bladder diverticula. Otherwise the urinary bladder is unremarkable. Bowel: No small or large bowel wall thickening or dilatation. Status post appendectomy. Mesentery, Omentum, and Peritoneum: No simple free fluid ascites. No pneumoperitoneum. No mesenteric hematoma identified. No organized fluid collection. Pelvic Organs: Status post hysterectomy. Bilateral adnexal regions are unremarkable. Lymph Nodes: No abdominal, pelvic, inguinal lymphadenopathy. Vasculature: Severe atherosclerotic plaque. No abdominal aorta or iliac aneurysm. Musculoskeletal: No significant soft tissue hematoma. Small to moderate umbilical hernia containing fat with abdominal wall defect of 3.3 cm. No acute pelvic fracture. No spinal fracture. Multilevel degenerative changes  spine. IMPRESSION: 1. Findings suggestive of choledocholithiasis in a patient status post cholecystectomy. Correlate with liver function tests. 2. Query slight fat stranding along the proximal pancreas with limited evaluation due to motion artifact. Correlate with lipase levels. 3.  No acute traumatic injury to the abdomen or pelvis on this noncontrast study. 4. No acute fracture or traumatic malalignment of the lumbar spine. Other imaging findings of potential clinical significance: 1. Small

## 2021-09-16 NOTE — Discharge Instructions (Signed)
1) Repeat BMP by PCP in 1 week ?2) Take Augmentin as prescribed ?

## 2021-09-16 NOTE — [place] FL2 (Signed)
?[place] MEDICAID FL2 LEVEL OF CARE SCREENING TOOL  ?  ? ?IDENTIFICATION  ?Patient Name: ?Deanna Shields Birthdate: [birth year] Sex: female Admission Date (Current Location): ?09/12/2021  ?[place] and [place] Number: ? [place] ?  Facility and Address:  ?[facility],  [place] [place], [place] ?     Provider Number: ?[id number]  ?Attending Physician Name and Address:  ?Leslee Home, DO ? Relative Name and Phone Number:  ?Osborne Casco (Niece)   [phone] ?   ?Current Level of Care: ?Hospital Recommended Level of Care: ?Assisted Living Facility Prior Approval Number: ?  ? ?Date Approved/Denied: ?  PASRR Number: ?  ? ?Discharge Plan: ?Domiciliary ([facility] home) ([facility]) ?  ? ?Current Diagnoses: ?Patient Active Problem List  ? Diagnosis Date Noted  ? Abnormal LFTs (liver function tests)   ? CKD (chronic kidney disease) stage 4, GFR 15-29 ml/min (HCC)   ? Fall   ? E coli bacteremia   ? Severe sepsis ([place]) 09/13/2021  ? Acute metabolic encephalopathy 35/70/1779  ? AKI (acute kidney injury) ([place]) 09/13/2021  ? Leukocytosis 09/04/2021  ? COVID-19 virus infection 05/29/2021  ? Generalized weakness 01/08/2021  ? UTI (urinary tract infection) 01/08/2021  ? CKD (chronic kidney disease) stage 3, GFR 30-59 ml/min (HCC) 12/29/2020  ? Edema of both lower extremities due to peripheral venous insufficiency 08/06/2020  ? Senile dementia ([place]) 07/11/2020  ? Vitamin B12 deficiency anemia 07/11/2020  ? Hypokalemia 05/15/2020  ? Memory loss 04/06/2020  ? Falls frequently 12/01/2017  ? Gastroenteritis 07/13/2017  ? Symptomatic PVCs 05/26/2017  ? Normal coronary arteries 05/26/2017  ? HLD (hyperlipidemia) 05/26/2017  ? Palpitations 05/26/2017  ? Bronchitis 03/31/2017  ? Transaminasemia 06/02/2016  ? Choledocholithiasis   ? Right middle lobe pneumonia 05/31/2016  ? CAP (community acquired pneumonia) 05/31/2016  ? Nausea & vomiting 05/31/2016  ? S/P laparoscopic cholecystectomy 01/29/2015  ? Pain in the  chest   ? Abnormal nuclear stress test   ? Allergic rhinitis 07/12/2014  ? Viral stomatitis 07/05/2014  ? History of syncope 11/10/2013  ? Hyponatremia 11/10/2013  ? Chest pain 10/28/2013  ? Dizziness 10/19/2013  ? Near syncope 10/19/2013  ? Leg cramps 12/25/2012  ? Bilateral claudication of lower limb ([place]) 12/25/2012  ? Stress reaction 09/15/2012  ? Hearing loss of right ear 07/31/2012  ? Retinal tear 11/03/2011  ? ABDOMINAL PAIN, RIGHT UPPER QUADRANT 06/18/2010  ? TINEA CRURIS 10/29/2009  ? Cerumen impaction 07/28/2009  ? NECK PAIN, RIGHT 07/28/2009  ? DIZZINESS 07/28/2009  ? Vitamin D deficiency 06/16/2009  ? Anemia, iron deficiency 04/01/2009  ? Shortness of breath 02/03/2009  ? DYSURIA 06/04/2008  ? INTERNAL HEMORRHOIDS 04/22/2008  ? HIATAL HERNIA 04/22/2008  ? GERD 03/21/2008  ? Allergic rhinitis, cause unspecified 08/10/2007  ? OSTEOPENIA 03/22/2007  ? BACKACHE NOS 02/23/2007  ? COUGH, CHRONIC 02/23/2007  ? Hypertension 11/17/2006  ? DEPENDENT EDEMA 11/17/2006  ? GERD (gastroesophageal reflux disease) 2004  ? ? ?Orientation RESPIRATION BLADDER Height & Weight   ?  ?Self, Situation, Place ? Normal Continent Weight: 91.1 kg ?Height:  '5\' 6"'$  (167.6 cm)  ?BEHAVIORAL SYMPTOMS/MOOD NEUROLOGICAL BOWEL NUTRITION STATUS  ?    Continent Diet (Regular)  ?AMBULATORY STATUS COMMUNICATION OF NEEDS Skin   ?Supervision Verbally Normal ?  ?  ?  ?    ?     ?     ? ? ?Personal Care Assistance Level of Assistance  ?Bathing, Feeding, Dressing Bathing Assistance: Limited assistance ?Feeding assistance: Independent ?  Dressing Assistance: Limited assistance ?   ? ?Functional Limitations Info  ?Sight, Hearing, Speech Sight Info: Adequate ?Hearing Info: Impaired ?Speech Info: Adequate  ? ? ?SPECIAL CARE FACTORS FREQUENCY  ?    ?  ?  ?  ?  ?  ?  ?   ? ? ?Contractures Contractures Info: Present  ? ? ?Additional Factors Info  ?Code Status, Allergies Code Status Info: DNR ?Allergies Info: Contrast Media, Codeine ?  ?  ?  ?   ? ? ?Discharge  Medications: ?acetaminophen 650 MG suppository ?Commonly known as: TYLENOL ?Place 650 mg rectally every 4 (four) hours as needed for fever.          ? alum & mag hydroxide-simeth 794-327-61 MG/5ML suspension ?Commonly known as: MAALOX PLUS ?Take 10 mLs by mouth every 6 (six) hours as needed for indigestion or heartburn.         ? amLODipine 2.5 MG tablet ?Commonly known as: NORVASC ?Take 1 tablet (2.5 mg total) by mouth daily.         ?Icon medications to start taking   amoxicillin-clavulanate 500-125 MG tablet ?Commonly known as: AUGMENTIN ?Take 1 tablet (500 mg total) by mouth 2 (two) times daily for 4 days. Stop date 09/19/21         ? benzonatate 100 MG capsule ?Commonly known as: TESSALON ?Take 1 capsule by mouth 3 (three) times daily.         ? ferrous sulfate 325 (65 FE) MG tablet ?Take 325 mg by mouth. Monday, Wednesday and Fridays         ? HM Calcium-Vitamin D 600-20 MG-MCG Tabs ?Take 1 tablet by mouth 2 (two) times daily. ?Generic drug: Calcium Carb-Cholecalciferol         ? omeprazole 40 MG capsule ?Commonly known as: PRILOSEC ?Take 40 mg by mouth daily.         ? ondansetron 4 MG disintegrating tablet ?Commonly known as: ZOFRAN-ODT ?Take 4 mg by mouth every 6 (six) hours as needed for nausea or vomiting.         ? potassium chloride SA 20 MEQ tablet ?Commonly known as: KLOR-CON M ?Take 20 mEq by mouth 2 (two) times daily.         ? rosuvastatin 20 MG tablet ?Commonly known as: CRESTOR ?Take 1 tablet (20 mg total) by mouth daily.         ? vitamin B-12 1000 MCG tablet ?Commonly known as: CYANOCOBALAMIN ?Take 1,000 mcg by mouth daily.         ? Vitamin D3 50 MCG (2000 UT) Tabs ?Take 1 capsule by mouth daily.         ? ? ? ?Relevant Imaging Results: ? ?Relevant Lab Results: ? ? ?Additional Information ?  ? ?Trish Mage, LCSW ? ? ? ? ?

## 2021-09-16 NOTE — TOC Transition Note (Signed)
Transition of Care (TOC) - CM/SW Discharge Note ? ? ?Patient Details  ?Name: [place] D Sayegh ?MRN: [id number] ?Date of Birth: 1929/05/04 ? ?Transition of Care (TOC) CM/SW Contact:  ?Trish Mage, LCSW ?Phone Number: ?09/16/2021, 10:43 AM ? ? ?Clinical Narrative:  Patient who is stable for d/c will return to [facility] today.  Niece will provide transportation.  FL2 sent to facility and message left for [place] at same.  No further needs identified. TOC sign off.  ? ? ? ?Final next level of care: Home/Self Care ?Barriers to Discharge: Barriers Resolved ? ? ?Patient Goals and CMS Choice ?Patient states their goals for this hospitalization and ongoing recovery are:: return to her AL apt ?  ?  ? ?Discharge Placement ?  ?           ?  ?  ?  ?  ? ?Discharge Plan and Services ?  ?  ?           ?  ?  ?  ?  ?  ?  ?  ?  ?  ?  ? ?Social Determinants of Health (SDOH) Interventions ?  ? ? ?Readmission Risk Interventions ? ?  09/14/2021  ?  2:32 PM  ?Readmission Risk Prevention Plan  ?Transportation Screening Complete  ?PCP or Specialist Appt within 5-7 Days Complete  ?Home Care Screening Complete  ? ? ? ? ? ?

## 2021-09-17 ENCOUNTER — Encounter: Payer: Self-pay | Admitting: Nurse Practitioner

## 2021-09-17 ENCOUNTER — Telehealth: Payer: Self-pay | Admitting: *Deleted

## 2021-09-17 ENCOUNTER — Non-Acute Institutional Stay (SKILLED_NURSING_FACILITY): Payer: Medicare Other | Admitting: Nurse Practitioner

## 2021-09-17 DIAGNOSIS — K219 Gastro-esophageal reflux disease without esophagitis: Secondary | ICD-10-CM

## 2021-09-17 DIAGNOSIS — E876 Hypokalemia: Secondary | ICD-10-CM

## 2021-09-17 DIAGNOSIS — I10 Essential (primary) hypertension: Secondary | ICD-10-CM

## 2021-09-17 DIAGNOSIS — R41841 Cognitive communication deficit: Secondary | ICD-10-CM | POA: Diagnosis not present

## 2021-09-17 DIAGNOSIS — F0390 Unspecified dementia without behavioral disturbance: Secondary | ICD-10-CM

## 2021-09-17 DIAGNOSIS — I872 Venous insufficiency (chronic) (peripheral): Secondary | ICD-10-CM | POA: Diagnosis not present

## 2021-09-17 DIAGNOSIS — R29898 Other symptoms and signs involving the musculoskeletal system: Secondary | ICD-10-CM | POA: Diagnosis not present

## 2021-09-17 DIAGNOSIS — E785 Hyperlipidemia, unspecified: Secondary | ICD-10-CM

## 2021-09-17 DIAGNOSIS — A419 Sepsis, unspecified organism: Secondary | ICD-10-CM | POA: Diagnosis not present

## 2021-09-17 DIAGNOSIS — R296 Repeated falls: Secondary | ICD-10-CM | POA: Diagnosis not present

## 2021-09-17 DIAGNOSIS — M6281 Muscle weakness (generalized): Secondary | ICD-10-CM | POA: Diagnosis not present

## 2021-09-17 DIAGNOSIS — K8309 Other cholangitis: Secondary | ICD-10-CM | POA: Diagnosis not present

## 2021-09-17 DIAGNOSIS — R6520 Severe sepsis without septic shock: Secondary | ICD-10-CM

## 2021-09-17 DIAGNOSIS — D509 Iron deficiency anemia, unspecified: Secondary | ICD-10-CM

## 2021-09-17 DIAGNOSIS — R053 Chronic cough: Secondary | ICD-10-CM | POA: Diagnosis not present

## 2021-09-17 NOTE — Anesthesia Postprocedure Evaluation (Signed)
Anesthesia Post Note ? ?Patient: Deanna Shields ? ?Procedure(s) Performed: ENDOSCOPIC RETROGRADE CHOLANGIOPANCREATOGRAPHY (ERCP) ?SPHINCTEROTOMY ?REMOVAL OF STONES ? ?  ? ?Patient location during evaluation: PACU ?Anesthesia Type: General ?Level of consciousness: awake and sedated ?Pain management: pain level controlled ?Vital Signs Assessment: post-procedure vital signs reviewed and stable ?Respiratory status: spontaneous breathing ?Cardiovascular status: stable ?Postop Assessment: no apparent nausea or vomiting ?Anesthetic complications: no ? ? ?No notable events documented. ? ?Last Vitals:  ?Vitals:  ? 09/15/21 1933 09/16/21 0420  ?BP: 131/63 (!) 148/70  ?Pulse: 66 62  ?Resp: 18 15  ?Temp: 36.4 ?C 36.8 ?C  ?SpO2: 99% 97%  ?  ?Last Pain:  ?Vitals:  ? 09/16/21 1006  ?TempSrc:   ?PainSc: 0-No pain  ? ? ?  ?  ?  ?  ?  ?  ? ?Huston Foley ? ? ? ? ?

## 2021-09-17 NOTE — Progress Notes (Addendum)
?Location:  [facility] ?Nursing Home Room Number: 970-Y ?Place of Service:  ALF (13) ?Provider:  [name], MD ? ? ? ? ?Patient Care Team: ?[name], MD as PCP - General (Internal Medicine) ?[name], MD as Consulting Physician (Cardiology) ? ?Extended Emergency Contact Information ?Primary Emergency Contact: Osborne Casco ?Address: [address] ?         [place], [place] [address] [place] ?Home Phone: [phone] ?Mobile Phone: [phone] ?Relation: Niece ? ?Code Status:  Full Code ?Goals of care: Advanced Directive information ? ?  09/17/2021  ?  2:10 PM  ?Advanced Directives  ?Does Patient Have a Medical Advance Directive? Yes  ?Type of Advance Directive Healthcare Power of Attorney  ?Does patient want to make changes to medical advance directive? No - Patient declined  ?Copy of [place] in Chart? Yes - validated most recent copy scanned in chart (See row information)  ? ? ? ?Chief Complaint  ?Patient presents with  ? Acute Visit  ?  Medication review following hospital stay  ? ? ?HPI:  ?Pt is a 88 y.o. female seen today for an acute visit for medication review following hospital stay.  ? Hospitalized 09/12/21-09/16/21, choledocholithiasis/ascending cholangitis s/p ERCP and stone removal. Severe sepsis/E coli bacteremia- takes Augmentin now. Acute metabolic encephalopathy, acute kidney injury, metabolic acidosis are resolved upon discharge.  ?  ? Chronic cough, on Benzonatate  ?            CKD, stage 3, Bun/creat 28/1.89 09/16/21 ?            GERD, better indigestion, burping, on Omeprazole. prn Maalox ?            Edema BLE, off Furosemide, not new,  EF 50-55% 06/01/2017.  ? Hypokalemia, on KCL, pending BMP ?            HTN, takes Amlodipine ?            Hyperlipidemia, LDL 77 06/18/21, takes Rosuvastatin ?            Dementia, functioning well in [facility], 06/26/20 MMSE 23/30, failed clock drawing.  ?            Anemia, Vit B12 228 04/03/20,  added Vit B 216/22, on Fe, Hgb 11.5 09/16/21 ? ? ?Past Medical History:  ?Diagnosis Date  ? Arthritis   ? "right thumb" (01/29/2015)  ? Chronic shoulder pain   ? "between my shoulders" (01/29/2015)  ? Dizziness   ? Esophageal stricture 2009  ? Gallstones   ? GERD (gastroesophageal reflux disease) 2004  ? Hemorrhoids 2004  ? Hiatal hernia 2004  ? History of blood transfusion 1947  ? "appendix ruptured"  ? HOH (hard of hearing)   ? Hypertension   ? Rhinitis, allergic   ? Syncopal episodes   ? ?Past Surgical History:  ?Procedure Laterality Date  ? APPENDECTOMY  1947  ? "ruptured"  ? CARDIAC CATHETERIZATION  10/23/1999; 09/2014  ? EF 60% No significant CAD; patent coronary arteries.  ? CARPAL TUNNEL RELEASE Right 1990's  ? CHOLECYSTECTOMY N/A 01/29/2015  ? Procedure: LAPAROSCOPIC CHOLECYSTECTOMY;  Surgeon: Rolm Bookbinder, MD;  Location: [facility];  Service: General;  Laterality: N/A;  ? DILATION AND CURETTAGE OF UTERUS  "couple"  ? ERCP N/A 09/13/2021  ? Procedure: ENDOSCOPIC RETROGRADE CHOLANGIOPANCREATOGRAPHY (ERCP);  Surgeon: Carol Ada, MD;  Location: [facility];  Service: Gastroenterology;  Laterality: N/A;  ? ESOPHAGOGASTRODUODENOSCOPY (EGD) WITH ESOPHAGEAL DILATION  2-3 times  ? HEMORROIDECTOMY    ? KNEE ARTHROSCOPY Bilateral   ? right x 2  ? LAPAROSCOPIC CHOLECYSTECTOMY  01/29/2015  ? LEFT HEART CATHETERIZATION WITH CORONARY ANGIOGRAM N/A 09/16/2014  ? Procedure: LEFT HEART CATHETERIZATION WITH CORONARY ANGIOGRAM;  Surgeon: [name], MD;  Location: [facility] CATH LAB;  Service: Cardiovascular;  Laterality: N/A;  ? NM MYOVIEW LTD  06/01/2012  ? EF 71%  ? REMOVAL OF STONES  09/13/2021  ? Procedure: REMOVAL OF STONES;  Surgeon: Carol Ada, MD;  Location: [facility];  Service: Gastroenterology;;  ? [name]  09/13/2021  ? Procedure: SPHINCTEROTOMY;  Surgeon: Carol Ada, MD;  Location: [facility];  Service: Gastroenterology;;  ? VAGINAL HYSTERECTOMY  1970's  ? ? ?Allergies  ?Allergen Reactions  ? Contrast  Media [Iodinated Contrast Media] Itching and Rash  ?  Pt covered in rash, red.  ? Codeine Nausea And Vomiting  ?  in large doses: can tolerate Tussionex  ? ? ?Outpatient Encounter Medications as of 09/17/2021  ?Medication Sig  ? acetaminophen (TYLENOL) 650 MG suppository Place 650 mg rectally every 4 (four) hours as needed for fever.  ? alum & mag hydroxide-simeth (MAALOX PLUS) 400-400-40 MG/5ML suspension Take 10 mLs by mouth every 6 (six) hours as needed for indigestion or heartburn.  ? amLODipine (NORVASC) 2.5 MG tablet Take 1 tablet (2.5 mg total) by mouth daily.  ? [EXPIRED] amoxicillin-clavulanate (AUGMENTIN) 500-125 MG tablet Take 1 tablet (500 mg total) by mouth 2 (two) times daily for 4 days. Stop date 09/19/21  ? benzonatate (TESSALON) 100 MG capsule Take 1 capsule by mouth 3 (three) times daily.  ? Calcium Carb-Cholecalciferol (HM CALCIUM-VITAMIN D) 600-800 MG-UNIT TABS Take 1 tablet by mouth 2 (two) times daily.  ? Cholecalciferol (VITAMIN D3) 50 MCG (2000 UT) TABS Take 1 capsule by mouth daily.  ? ferrous sulfate 325 (65 FE) MG tablet Take 325 mg by mouth. Monday, Wednesday and Fridays  ? omeprazole (PRILOSEC) 40 MG capsule Take 40 mg by mouth daily.  ? ondansetron (ZOFRAN-ODT) 4 MG disintegrating tablet Take 4 mg by mouth every 6 (six) hours as needed for nausea or vomiting.  ? potassium chloride SA (KLOR-CON M) 20 MEQ tablet Take 20 mEq by mouth 2 (two) times daily.  ? rosuvastatin (CRESTOR) 20 MG tablet Take 1 tablet (20 mg total) by mouth daily.  ? vitamin B-12 (CYANOCOBALAMIN) 1000 MCG tablet Take 1,000 mcg by mouth daily.  ? ?No facility-administered encounter medications on file as of 09/17/2021.  ? ? ?Review of Systems  ?Constitutional:  Positive for fatigue and unexpected weight change. Negative for appetite change and fever.  ?     ? Weight loss #26Ibs in one day, measurement error? Observe.   ?HENT:  Positive for hearing loss. Negative for congestion, rhinorrhea, sore throat and trouble  swallowing.   ?Respiratory:  Positive for cough. Negative for chest tightness and shortness of breath.   ?Cardiovascular:  Positive for leg swelling.  ?Gastrointestinal:  Negative for abdominal pain, constipation, nausea and vomiting.  ?     Last BM today.   ?Genitourinary:  Positive for frequency. Negative for dysuria and urgency.  ?     Baseline urinary frequency  ?Musculoskeletal:  Positive for gait problem.  ?Skin:  Negative for color change.  ?Neurological:  Negative for speech difficulty, weakness and light-headedness.  ?     Memory lapses.   ?Psychiatric/Behavioral:  Negative for confusion and sleep disturbance. The patient is not nervous/anxious.   ? ?Immunization History  ?  Administered Date(s) Administered  ? Influenza Split 03/16/2011, 03/01/2012  ? Influenza Whole 03/28/2007, 04/01/2009, 02/18/2010  ? Influenza, High Dose Seasonal PF 03/14/2013  ? Influenza,inj,Quad PF,6+ Mos 03/20/2014, 03/11/2015  ? Influenza-Unspecified 08/06/2018, 03/20/2020  ? Moderna SARS-COV2 Booster Vaccination 11/04/2020  ? Moderna Sars-Covid-2 Vaccination 06/11/2019, 07/09/2019, 02/24/2021  ? Pneumococcal Conjugate-13 02/25/2015  ? Pneumococcal Polysaccharide-23 03/28/2007  ? Td 12/20/2005  ? ?Pertinent  Health Maintenance Due  ?Topic Date Due  ? MAMMOGRAM  09/15/2013  ? INFLUENZA VACCINE  01/05/2022  ? DEXA SCAN  Completed  ? ? ?  09/14/2021  ? 12:00 PM 09/14/2021  ?  8:15 PM 09/15/2021  ?  3:00 PM 09/15/2021  ?  8:00 PM 09/16/2021  ? 10:06 AM  ?Fall Risk  ?Patient Fall Risk Level High fall risk High fall risk High fall risk High fall risk High fall risk  ? ?Functional Status Survey: ?  ? ?Vitals:  ? 09/17/21 1406  ?BP: (!) 161/87  ?Pulse: 77  ?Resp: 16  ?Temp: 98.2 ?F (36.8 ?C)  ?SpO2: 97%  ?Weight: 174 lb (78.9 kg)  ?Height: '5\' 6"'$  (1.676 m)  ? ?Body mass index is 28.08 kg/m?[name] ?Physical Exam ?Constitutional:   ?   Comments: Appears tired.   ?HENT:  ?   Head: Normocephalic and atraumatic.  ?   Nose: Nose normal.  ?   Mouth/Throat:   ?   Mouth: Mucous membranes are moist.  ?Eyes:  ?   Extraocular Movements: Extraocular movements intact.  ?   Conjunctiva/sclera: Conjunctivae normal.  ?   Pupils: Pupils are equal, round, and reactive to light.

## 2021-09-17 NOTE — Assessment & Plan Note (Signed)
LDL 77 06/18/21, takes Rosuvastatin ?

## 2021-09-17 NOTE — Assessment & Plan Note (Signed)
better indigestion, burping, on Omeprazole. prn Maalox 

## 2021-09-17 NOTE — Assessment & Plan Note (Addendum)
06/26/20 MMSE 23/30, failed clock drawing.  ?The patient needs higher level of care SNF [facility] ?

## 2021-09-17 NOTE — Telephone Encounter (Signed)
Transition Care Management Unsuccessful Follow-up Telephone Call ? ?Date of discharge and from where:  09/16/2021 [facility] ? ?Attempts:  1st Attempt ? ?Reason for unsuccessful TCM follow-up call:  Unable to reach patient [facility] ? ?  ?

## 2021-09-17 NOTE — Assessment & Plan Note (Signed)
on Benzonatate  

## 2021-09-17 NOTE — Assessment & Plan Note (Signed)
Vit B12 228 04/03/20, added Vit B 216/22, on Fe, Hgb 11.5 09/16/21 ?

## 2021-09-17 NOTE — Assessment & Plan Note (Signed)
Blood pressure is controlled,  takes Amlodipine 

## 2021-09-17 NOTE — Assessment & Plan Note (Signed)
Off Furosemide, trace edema BLE, will update BMP ?

## 2021-09-17 NOTE — Assessment & Plan Note (Signed)
on KCL, pending CMP/eGFR ?

## 2021-09-17 NOTE — Assessment & Plan Note (Deleted)
better indigestion, burping, on Omeprazole. prn Maalox 

## 2021-09-17 NOTE — Assessment & Plan Note (Signed)
Hospitalized 09/12/21-09/16/21, choledocholithiasis/ascending cholangitis s/p ERCP and stone removal. Severe sepsis/E coli bacteremia- takes Augmentin now. F/u CBC/diff.  ?

## 2021-09-18 ENCOUNTER — Encounter: Payer: Self-pay | Admitting: Nurse Practitioner

## 2021-09-18 DIAGNOSIS — R29898 Other symptoms and signs involving the musculoskeletal system: Secondary | ICD-10-CM | POA: Diagnosis not present

## 2021-09-18 DIAGNOSIS — R6520 Severe sepsis without septic shock: Secondary | ICD-10-CM | POA: Diagnosis not present

## 2021-09-18 DIAGNOSIS — M6281 Muscle weakness (generalized): Secondary | ICD-10-CM | POA: Diagnosis not present

## 2021-09-18 DIAGNOSIS — K8309 Other cholangitis: Secondary | ICD-10-CM | POA: Diagnosis not present

## 2021-09-18 DIAGNOSIS — R41841 Cognitive communication deficit: Secondary | ICD-10-CM | POA: Diagnosis not present

## 2021-09-18 DIAGNOSIS — K563 Gallstone ileus: Secondary | ICD-10-CM | POA: Diagnosis not present

## 2021-09-18 DIAGNOSIS — R296 Repeated falls: Secondary | ICD-10-CM | POA: Diagnosis not present

## 2021-09-18 LAB — BASIC METABOLIC PANEL
BUN: 17 (ref 4–21)
CO2: 20 (ref 13–22)
Chloride: 111 — AB (ref 99–108)
Creatinine: 1.8 — AB (ref 0.5–1.1)
Glucose: 86
Potassium: 4 mEq/L (ref 3.5–5.1)
Sodium: 142 (ref 137–147)

## 2021-09-18 LAB — CULTURE, BLOOD (ROUTINE X 2): Culture: NO GROWTH

## 2021-09-18 LAB — HEPATIC FUNCTION PANEL
ALT: 20 U/L (ref 7–35)
AST: 24 (ref 13–35)
Alkaline Phosphatase: 452 — AB (ref 25–125)
Bilirubin, Total: 1.4

## 2021-09-18 LAB — CBC AND DIFFERENTIAL
HCT: 36 (ref 36–46)
Hemoglobin: 12.1 (ref 12.0–16.0)
Neutrophils Absolute: 4398
Platelets: 209 10*3/uL (ref 150–400)
WBC: 3.6

## 2021-09-18 LAB — COMPREHENSIVE METABOLIC PANEL
Albumin: 2.8 — AB (ref 3.5–5.0)
Calcium: 8.8 (ref 8.7–10.7)
Globulin: 2
eGFR: 26

## 2021-09-18 LAB — CBC: RBC: 3.63 — AB (ref 3.87–5.11)

## 2021-09-18 NOTE — Addendum Note (Signed)
Addended by: Dorothea Ogle X on: 09/18/2021 01:18 PM ? ? Modules accepted: Level of Service ? ?

## 2021-09-20 DIAGNOSIS — R6520 Severe sepsis without septic shock: Secondary | ICD-10-CM | POA: Diagnosis not present

## 2021-09-20 DIAGNOSIS — K8309 Other cholangitis: Secondary | ICD-10-CM | POA: Diagnosis not present

## 2021-09-20 DIAGNOSIS — R41841 Cognitive communication deficit: Secondary | ICD-10-CM | POA: Diagnosis not present

## 2021-09-20 DIAGNOSIS — M6281 Muscle weakness (generalized): Secondary | ICD-10-CM | POA: Diagnosis not present

## 2021-09-20 DIAGNOSIS — R296 Repeated falls: Secondary | ICD-10-CM | POA: Diagnosis not present

## 2021-09-20 DIAGNOSIS — R29898 Other symptoms and signs involving the musculoskeletal system: Secondary | ICD-10-CM | POA: Diagnosis not present

## 2021-09-21 LAB — CULTURE, BLOOD (ROUTINE X 2)
Culture: NO GROWTH
Culture: NO GROWTH
Special Requests: ADEQUATE

## 2021-09-22 DIAGNOSIS — K8309 Other cholangitis: Secondary | ICD-10-CM | POA: Diagnosis not present

## 2021-09-22 DIAGNOSIS — R41841 Cognitive communication deficit: Secondary | ICD-10-CM | POA: Diagnosis not present

## 2021-09-22 DIAGNOSIS — R29898 Other symptoms and signs involving the musculoskeletal system: Secondary | ICD-10-CM | POA: Diagnosis not present

## 2021-09-22 DIAGNOSIS — E876 Hypokalemia: Secondary | ICD-10-CM | POA: Diagnosis not present

## 2021-09-22 DIAGNOSIS — R296 Repeated falls: Secondary | ICD-10-CM | POA: Diagnosis not present

## 2021-09-22 DIAGNOSIS — R6520 Severe sepsis without septic shock: Secondary | ICD-10-CM | POA: Diagnosis not present

## 2021-09-22 DIAGNOSIS — M6281 Muscle weakness (generalized): Secondary | ICD-10-CM | POA: Diagnosis not present

## 2021-09-22 LAB — BASIC METABOLIC PANEL
BUN: 16 (ref 4–21)
CO2: 22 (ref 13–22)
Chloride: 106 (ref 99–108)
Creatinine: 1.6 — AB (ref 0.5–1.1)
Glucose: 65
Potassium: 3.9 mEq/L (ref 3.5–5.1)
Sodium: 141 (ref 137–147)

## 2021-09-22 LAB — COMPREHENSIVE METABOLIC PANEL
Calcium: 8.9 (ref 8.7–10.7)
eGFR: 30

## 2021-09-25 ENCOUNTER — Encounter: Payer: Self-pay | Admitting: Nurse Practitioner

## 2021-09-25 ENCOUNTER — Non-Acute Institutional Stay: Payer: Medicare Other | Admitting: Nurse Practitioner

## 2021-09-25 DIAGNOSIS — M6281 Muscle weakness (generalized): Secondary | ICD-10-CM | POA: Diagnosis not present

## 2021-09-25 DIAGNOSIS — D509 Iron deficiency anemia, unspecified: Secondary | ICD-10-CM | POA: Diagnosis not present

## 2021-09-25 DIAGNOSIS — K219 Gastro-esophageal reflux disease without esophagitis: Secondary | ICD-10-CM

## 2021-09-25 DIAGNOSIS — R053 Chronic cough: Secondary | ICD-10-CM | POA: Diagnosis not present

## 2021-09-25 DIAGNOSIS — E876 Hypokalemia: Secondary | ICD-10-CM | POA: Diagnosis not present

## 2021-09-25 DIAGNOSIS — R6520 Severe sepsis without septic shock: Secondary | ICD-10-CM | POA: Diagnosis not present

## 2021-09-25 DIAGNOSIS — F0390 Unspecified dementia without behavioral disturbance: Secondary | ICD-10-CM

## 2021-09-25 DIAGNOSIS — R41841 Cognitive communication deficit: Secondary | ICD-10-CM | POA: Diagnosis not present

## 2021-09-25 DIAGNOSIS — R7881 Bacteremia: Secondary | ICD-10-CM

## 2021-09-25 DIAGNOSIS — B9620 Unspecified Escherichia coli [E. coli] as the cause of diseases classified elsewhere: Secondary | ICD-10-CM | POA: Diagnosis not present

## 2021-09-25 DIAGNOSIS — R531 Weakness: Secondary | ICD-10-CM

## 2021-09-25 DIAGNOSIS — K8309 Other cholangitis: Secondary | ICD-10-CM | POA: Diagnosis not present

## 2021-09-25 DIAGNOSIS — R29898 Other symptoms and signs involving the musculoskeletal system: Secondary | ICD-10-CM | POA: Diagnosis not present

## 2021-09-25 DIAGNOSIS — R296 Repeated falls: Secondary | ICD-10-CM | POA: Diagnosis not present

## 2021-09-25 DIAGNOSIS — N184 Chronic kidney disease, stage 4 (severe): Secondary | ICD-10-CM | POA: Diagnosis not present

## 2021-09-25 DIAGNOSIS — E785 Hyperlipidemia, unspecified: Secondary | ICD-10-CM | POA: Diagnosis not present

## 2021-09-25 DIAGNOSIS — I10 Essential (primary) hypertension: Secondary | ICD-10-CM | POA: Diagnosis not present

## 2021-09-25 DIAGNOSIS — I872 Venous insufficiency (chronic) (peripheral): Secondary | ICD-10-CM

## 2021-09-25 NOTE — Progress Notes (Signed)
?Location:  [facility] ?Nursing Home Room Number: AL/804/A ?Place of Service:  ALF (13) ?Provider:  Onya Eutsler X , NP ? ?Deanna Dad, MD ? ?Patient Care Team: ?Deanna Dad, MD as PCP - General (Internal Medicine) ?[name], MD as Consulting Physician (Cardiology) ? ?Extended Emergency Contact Information ?Primary Emergency Contact: Osborne Casco ?Address: [address] ?         [place], [place] [address] [place] ?Home Phone: [phone] ?Mobile Phone: [phone] ?Relation: Niece ? ?Code Status:  FULL ?Goals of care: Advanced Directive information ? ?  09/25/2021  ?  8:47 AM  ?Advanced Directives  ?Does Patient Have a Medical Advance Directive? Yes  ?Type of Advance Directive Healthcare Power of Attorney  ?Does patient want to make changes to medical advance directive? No - Patient declined  ?Copy of [place] in Chart? Yes - validated most recent copy scanned in chart (See row information)  ? ? ? ?Chief Complaint  ?Patient presents with  ? Acute Visit  ?  Patient is here for generalized weakness and poor appetite   ? ? ?HPI:  ?Pt is a 88 y.o. female seen today for an acute visit for generalized weakness since last hospitalization. The patient stated she is getting stronger, eats better, denied abd pain, nausea, vomiting, or constipation.  ?  ? Hospitalized 09/12/21-09/16/21, choledocholithiasis/ascending cholangitis s/p ERCP and stone removal. Severe sepsis/E coli bacteremia- fully treated.  ?            Chronic cough, on Benzonatate  ?            CKD, stage 3, Bun/creat 16/1.59 09/22/21 ?            GERD, better indigestion, burping, on Omeprazole. prn Maalox ?            Edema BLE, off Furosemide, not new,  EF 50-55% 06/01/2017.  ?            Hypokalemia, on KCL, K 3.9 09/22/21 ?            HTN, takes Amlodipine ?            Hyperlipidemia, LDL 77 06/18/21, takes Rosuvastatin ?            Dementia, functioning well in [facility], 06/26/20 MMSE 23/30,  failed clock drawing.  ?            Anemia, Vit B12 228 04/03/20, added Vit B 216/22, on Fe, Hgb 11.5 09/16/21 ?  ?  ? ?Past Medical History:  ?Diagnosis Date  ? Arthritis   ? "right thumb" (01/29/2015)  ? Chronic shoulder pain   ? "between my shoulders" (01/29/2015)  ? Dizziness   ? Esophageal stricture 2009  ? Gallstones   ? GERD (gastroesophageal reflux disease) 2004  ? Hemorrhoids 2004  ? Hiatal hernia 2004  ? History of blood transfusion 1947  ? "appendix ruptured"  ? HOH (hard of hearing)   ? Hypertension   ? Rhinitis, allergic   ? Syncopal episodes   ? ?Past Surgical History:  ?Procedure Laterality Date  ? APPENDECTOMY  1947  ? "ruptured"  ? CARDIAC CATHETERIZATION  10/23/1999; 09/2014  ? EF 60% No significant CAD; patent coronary arteries.  ? CARPAL TUNNEL RELEASE Right 1990's  ? CHOLECYSTECTOMY N/A 01/29/2015  ? Procedure: LAPAROSCOPIC CHOLECYSTECTOMY;  Surgeon: Rolm Bookbinder, MD;  Location: [facility];  Service: General;  Laterality: N/A;  ? DILATION AND CURETTAGE OF UTERUS  "  couple"  ? ERCP N/A 09/13/2021  ? Procedure: ENDOSCOPIC RETROGRADE CHOLANGIOPANCREATOGRAPHY (ERCP);  Surgeon: Carol Ada, MD;  Location: [facility];  Service: Gastroenterology;  Laterality: N/A;  ? ESOPHAGOGASTRODUODENOSCOPY (EGD) WITH ESOPHAGEAL DILATION  2-3 times  ? HEMORROIDECTOMY    ? KNEE ARTHROSCOPY Bilateral   ? right x 2  ? LAPAROSCOPIC CHOLECYSTECTOMY  01/29/2015  ? LEFT HEART CATHETERIZATION WITH CORONARY ANGIOGRAM N/A 09/16/2014  ? Procedure: LEFT HEART CATHETERIZATION WITH CORONARY ANGIOGRAM;  Surgeon: [name], MD;  Location: [facility] C[facility];  Service: Cardiovascular;  Laterality: N/A;  ? NM MYOVIEW LTD  06/01/2012  ? EF 71%  ? REMOVAL OF STONES  09/13/2021  ? Procedure: REMOVAL OF STONES;  Surgeon: Carol Ada, MD;  Location: [facility];  Service: Gastroenterology;;  ? [name]  09/13/2021  ? Procedure: SPHINCTEROTOMY;  Surgeon: Carol Ada, MD;  Location: [facility];  Service: Gastroenterology;;  ? VAGINAL  HYSTERECTOMY  1970's  ? ? ?Allergies  ?Allergen Reactions  ? Contrast Media [Iodinated Contrast Media] Itching and Rash  ?  Pt covered in rash, red.  ? Codeine Nausea And Vomiting  ?  in large doses: can tolerate Tussionex  ? ? ?Outpatient Encounter Medications as of 09/25/2021  ?Medication Sig  ? acetaminophen (TYLENOL) 650 MG suppository Place 650 mg rectally every 4 (four) hours as needed for fever.  ? alum & mag hydroxide-simeth (MAALOX PLUS) 400-400-40 MG/5ML suspension Take 10 mLs by mouth every 6 (six) hours as needed for indigestion or heartburn.  ? amLODipine (NORVASC) 2.5 MG tablet Take 1 tablet (2.5 mg total) by mouth daily.  ? benzonatate (TESSALON) 100 MG capsule Take 1 capsule by mouth 3 (three) times daily.  ? Calcium Carb-Cholecalciferol (HM CALCIUM-VITAMIN D) 600-800 MG-UNIT TABS Take 1 tablet by mouth 2 (two) times daily.  ? Cholecalciferol (VITAMIN D3) 50 MCG (2000 UT) TABS Take 1 capsule by mouth daily.  ? ferrous sulfate 325 (65 FE) MG tablet Take 325 mg by mouth. Monday, Wednesday and Fridays  ? omeprazole (PRILOSEC) 40 MG capsule Take 40 mg by mouth daily.  ? ondansetron (ZOFRAN-ODT) 4 MG disintegrating tablet Take 4 mg by mouth every 6 (six) hours as needed for nausea or vomiting.  ? potassium chloride SA (KLOR-CON M) 20 MEQ tablet Take 20 mEq by mouth 2 (two) times daily.  ? rosuvastatin (CRESTOR) 20 MG tablet Take 1 tablet (20 mg total) by mouth daily.  ? vitamin B-12 (CYANOCOBALAMIN) 1000 MCG tablet Take 1,000 mcg by mouth daily.  ? ?No facility-administered encounter medications on file as of 09/25/2021.  ? ? ?Review of Systems  ?Constitutional:  Positive for fatigue. Negative for appetite change and fever.  ?HENT:  Positive for hearing loss. Negative for congestion and trouble swallowing.   ?Respiratory:  Positive for cough. Negative for chest tightness and shortness of breath.   ?Cardiovascular:  Positive for leg swelling.  ?Gastrointestinal:  Negative for abdominal pain, constipation,  nausea and vomiting.  ?     Last BM today.   ?Genitourinary:  Positive for frequency. Negative for dysuria and urgency.  ?     Baseline urinary frequency  ?Musculoskeletal:  Positive for gait problem.  ?Skin:  Negative for color change.  ?Neurological:  Negative for speech difficulty, weakness and light-headedness.  ?     Memory lapses.   ?Psychiatric/Behavioral:  Negative for sleep disturbance. The patient is not nervous/anxious.   ? ?Immunization History  ?Administered Date(s) Administered  ? Influenza Split 03/16/2011, 03/01/2012  ? Influenza Whole 03/28/2007, 04/01/2009,  02/18/2010  ? Influenza, High Dose Seasonal PF 03/14/2013  ? Influenza,inj,Quad PF,6+ Mos 03/20/2014, 03/11/2015  ? Influenza-Unspecified 08/06/2018, 03/20/2020  ? Moderna SARS-COV2 Booster Vaccination 11/04/2020  ? Moderna Sars-Covid-2 Vaccination 06/11/2019, 07/09/2019, 02/24/2021  ? Pneumococcal Conjugate-13 02/25/2015  ? Pneumococcal Polysaccharide-23 03/28/2007  ? Td 12/20/2005  ? ?Pertinent  Health Maintenance Due  ?Topic Date Due  ? MAMMOGRAM  09/15/2013  ? INFLUENZA VACCINE  01/05/2022  ? DEXA SCAN  Completed  ? ? ?  09/14/2021  ? 12:00 PM 09/14/2021  ?  8:15 PM 09/15/2021  ?  3:00 PM 09/15/2021  ?  8:00 PM 09/16/2021  ? 10:06 AM  ?Fall Risk  ?Patient Fall Risk Level High fall risk High fall risk High fall risk High fall risk High fall risk  ? ?Functional Status Survey: ?  ? ?Vitals:  ? 09/25/21 0845  ?BP: (!) 146/64  ?Pulse: 70  ?Resp: 20  ?Temp: (!) 97.3 ?F (36.3 ?C)  ?SpO2: 94%  ?Weight: 174 lb (78.9 kg)  ?Height: 5' (1.524 m)  ? ?Body mass index is 33.98 kg/m?[name] ?Physical Exam ?Constitutional:   ?   Appearance: Normal appearance.  ?HENT:  ?   Head: Normocephalic and atraumatic.  ?   Mouth/Throat:  ?   Mouth: Mucous membranes are moist.  ?Eyes:  ?   Extraocular Movements: Extraocular movements intact.  ?   Conjunctiva/sclera: Conjunctivae normal.  ?   Pupils: Pupils are equal, round, and reactive to light.  ?Cardiovascular:  ?   Rate and  Rhythm: Normal rate and regular rhythm.  ?   Heart sounds: No murmur heard. ?Pulmonary:  ?   Effort: Pulmonary effort is normal.  ?   Breath sounds: No rales.  ?Abdominal:  ?   General: Bowel sounds are normal.  ?

## 2021-09-28 ENCOUNTER — Encounter: Payer: Self-pay | Admitting: Nurse Practitioner

## 2021-09-28 DIAGNOSIS — R6520 Severe sepsis without septic shock: Secondary | ICD-10-CM | POA: Diagnosis not present

## 2021-09-28 DIAGNOSIS — R296 Repeated falls: Secondary | ICD-10-CM | POA: Diagnosis not present

## 2021-09-28 DIAGNOSIS — R29898 Other symptoms and signs involving the musculoskeletal system: Secondary | ICD-10-CM | POA: Diagnosis not present

## 2021-09-28 DIAGNOSIS — K8309 Other cholangitis: Secondary | ICD-10-CM | POA: Diagnosis not present

## 2021-09-28 DIAGNOSIS — R41841 Cognitive communication deficit: Secondary | ICD-10-CM | POA: Diagnosis not present

## 2021-09-28 DIAGNOSIS — M6281 Muscle weakness (generalized): Secondary | ICD-10-CM | POA: Diagnosis not present

## 2021-09-29 DIAGNOSIS — K8309 Other cholangitis: Secondary | ICD-10-CM | POA: Diagnosis not present

## 2021-09-29 DIAGNOSIS — R296 Repeated falls: Secondary | ICD-10-CM | POA: Diagnosis not present

## 2021-09-29 DIAGNOSIS — M6281 Muscle weakness (generalized): Secondary | ICD-10-CM | POA: Diagnosis not present

## 2021-09-29 DIAGNOSIS — R6520 Severe sepsis without septic shock: Secondary | ICD-10-CM | POA: Diagnosis not present

## 2021-09-29 DIAGNOSIS — R29898 Other symptoms and signs involving the musculoskeletal system: Secondary | ICD-10-CM | POA: Diagnosis not present

## 2021-09-29 DIAGNOSIS — R41841 Cognitive communication deficit: Secondary | ICD-10-CM | POA: Diagnosis not present

## 2021-09-29 NOTE — Assessment & Plan Note (Signed)
off Furosemide, mild edema BLE not new,  EF 50-55% 06/01/2017.  ?

## 2021-09-29 NOTE — Assessment & Plan Note (Signed)
At baseline, continue Benzonatate.  ?

## 2021-09-29 NOTE — Assessment & Plan Note (Signed)
Blood pressure is controlled,  takes Amlodipine 

## 2021-09-29 NOTE — Assessment & Plan Note (Signed)
Continue supportive care, may need SNF [facility] for higher level of care.  ?

## 2021-09-29 NOTE — Assessment & Plan Note (Signed)
better indigestion, burping, on Omeprazole. prn Maalox 

## 2021-09-29 NOTE — Assessment & Plan Note (Signed)
on KCL, K 3.9 09/22/21 ?

## 2021-09-29 NOTE — Assessment & Plan Note (Signed)
Vit B12 228 04/03/20, added Vit B 216/22, on Fe, Hgb 11.5 09/16/21 ?

## 2021-09-29 NOTE — Assessment & Plan Note (Signed)
LDL 77 06/18/21, takes Rosuvastatin ?

## 2021-09-29 NOTE — Assessment & Plan Note (Signed)
Improving, Bun/creat 16/1.59 09/22/21 ?

## 2021-09-29 NOTE — Assessment & Plan Note (Addendum)
functioning well in [facility], 06/26/20 MMSE 23/30, failed clock drawing. 08/13/21 MOST limited additional interventions, ABT/IVF for a defined trial period, no feeding tube.  ? ?

## 2021-09-29 NOTE — Assessment & Plan Note (Signed)
Hospitalized 09/12/21-09/16/21, choledocholithiasis/ascending cholangitis s/p ERCP and stone removal. Severe sepsis/E coli bacteremia- fully treated.  ?

## 2021-09-30 DIAGNOSIS — R6520 Severe sepsis without septic shock: Secondary | ICD-10-CM | POA: Diagnosis not present

## 2021-09-30 DIAGNOSIS — R41841 Cognitive communication deficit: Secondary | ICD-10-CM | POA: Diagnosis not present

## 2021-09-30 DIAGNOSIS — M6281 Muscle weakness (generalized): Secondary | ICD-10-CM | POA: Diagnosis not present

## 2021-09-30 DIAGNOSIS — R296 Repeated falls: Secondary | ICD-10-CM | POA: Diagnosis not present

## 2021-09-30 DIAGNOSIS — K8309 Other cholangitis: Secondary | ICD-10-CM | POA: Diagnosis not present

## 2021-09-30 DIAGNOSIS — R29898 Other symptoms and signs involving the musculoskeletal system: Secondary | ICD-10-CM | POA: Diagnosis not present

## 2021-10-01 ENCOUNTER — Encounter: Payer: Self-pay | Admitting: Nurse Practitioner

## 2021-10-01 ENCOUNTER — Non-Acute Institutional Stay: Payer: Medicare Other | Admitting: Nurse Practitioner

## 2021-10-01 DIAGNOSIS — E876 Hypokalemia: Secondary | ICD-10-CM

## 2021-10-01 DIAGNOSIS — N1831 Chronic kidney disease, stage 3a: Secondary | ICD-10-CM | POA: Diagnosis not present

## 2021-10-01 DIAGNOSIS — F0390 Unspecified dementia without behavioral disturbance: Secondary | ICD-10-CM | POA: Diagnosis not present

## 2021-10-01 DIAGNOSIS — R053 Chronic cough: Secondary | ICD-10-CM | POA: Diagnosis not present

## 2021-10-01 DIAGNOSIS — K219 Gastro-esophageal reflux disease without esophagitis: Secondary | ICD-10-CM

## 2021-10-01 DIAGNOSIS — I10 Essential (primary) hypertension: Secondary | ICD-10-CM | POA: Diagnosis not present

## 2021-10-01 DIAGNOSIS — R6520 Severe sepsis without septic shock: Secondary | ICD-10-CM | POA: Diagnosis not present

## 2021-10-01 DIAGNOSIS — K449 Diaphragmatic hernia without obstruction or gangrene: Secondary | ICD-10-CM

## 2021-10-01 DIAGNOSIS — D509 Iron deficiency anemia, unspecified: Secondary | ICD-10-CM | POA: Diagnosis not present

## 2021-10-01 DIAGNOSIS — E785 Hyperlipidemia, unspecified: Secondary | ICD-10-CM

## 2021-10-01 DIAGNOSIS — K8050 Calculus of bile duct without cholangitis or cholecystitis without obstruction: Secondary | ICD-10-CM | POA: Diagnosis not present

## 2021-10-01 DIAGNOSIS — M6281 Muscle weakness (generalized): Secondary | ICD-10-CM | POA: Diagnosis not present

## 2021-10-01 DIAGNOSIS — K8309 Other cholangitis: Secondary | ICD-10-CM | POA: Diagnosis not present

## 2021-10-01 DIAGNOSIS — R29898 Other symptoms and signs involving the musculoskeletal system: Secondary | ICD-10-CM | POA: Diagnosis not present

## 2021-10-01 DIAGNOSIS — I872 Venous insufficiency (chronic) (peripheral): Secondary | ICD-10-CM | POA: Diagnosis not present

## 2021-10-01 DIAGNOSIS — R41841 Cognitive communication deficit: Secondary | ICD-10-CM | POA: Diagnosis not present

## 2021-10-01 DIAGNOSIS — R296 Repeated falls: Secondary | ICD-10-CM | POA: Diagnosis not present

## 2021-10-01 NOTE — Assessment & Plan Note (Signed)
At her baseline, on Benzonatate  ?

## 2021-10-01 NOTE — Assessment & Plan Note (Signed)
stage 3, Bun/creat 16/1.59 09/22/21 ?

## 2021-10-01 NOTE — Progress Notes (Signed)
?Location:  [facility] ?Nursing Home Room Number: 108/A ?Place of Service:  ALF (13) ?Provider:  Karlei Waldo X, NP ? ? ?[name], MD ? ?Patient Care Team: ?[name], MD as PCP - General (Internal Medicine) ?[name], MD as Consulting Physician (Cardiology) ? ?Extended Emergency Contact Information ?Primary Emergency Contact: Osborne Casco ?Address: [name] ?         [place], [place] [address] [place] ?Home Phone: [phone] ?Mobile Phone: [phone] ?Relation: Niece ? ?Code Status:  Full ?Goals of care: Advanced Directive information ? ?  10/01/2021  ?  9:17 AM  ?Advanced Directives  ?Does Patient Have a Medical Advance Directive? Yes  ?Type of Advance Directive Healthcare Power of Attorney  ?Does patient want to make changes to medical advance directive? No - Patient declined  ?Copy of [place] in Chart? Yes - validated most recent copy scanned in chart (See row information)  ? ? ? ?Chief Complaint  ?Patient presents with  ? Medical Management of Chronic Issues  ?  Patient is here for a follow up for chronic conditions ?  ? Health Maintenance  ?  Patient is due for a mammogram and a TDAP vaccine  ? ? ?HPI:  ?Pt is a 88 y.o. female seen today for managing chronic medical conditions.  ?  ? ? Hospitalized 09/12/21-09/16/21, choledocholithiasis/ascending cholangitis s/p ERCP and stone removal. Severe sepsis/E coli bacteremia- fully treated.  ?            Chronic cough, on Benzonatate  ?            CKD, stage 3, Bun/creat 16/1.59 09/22/21 ?            GERD, better indigestion, burping, on Omeprazole. prn Maalox ?            Edema BLE, off Furosemide, not new,  EF 50-55% 06/01/2017.  ?            Hypokalemia, on KCL, K 3.9 09/22/21 ?            HTN, takes Amlodipine ?            Hyperlipidemia, LDL 77 06/18/21, takes Rosuvastatin ?            Dementia, gradual decline, 06/26/20 MMSE 23/30, failed clock drawing.  ?            Anemia, Vit B12 228  04/03/20, added Vit B 216/22, on Fe, Hgb 11.5 09/16/21 ?  ?  ?  ?Past Medical History:  ?Diagnosis Date  ? Arthritis   ? "right thumb" (01/29/2015)  ? Chronic shoulder pain   ? "between my shoulders" (01/29/2015)  ? Dizziness   ? Esophageal stricture 2009  ? Gallstones   ? GERD (gastroesophageal reflux disease) 2004  ? Hemorrhoids 2004  ? Hiatal hernia 2004  ? History of blood transfusion 1947  ? "appendix ruptured"  ? HOH (hard of hearing)   ? Hypertension   ? Rhinitis, allergic   ? Syncopal episodes   ? ?Past Surgical History:  ?Procedure Laterality Date  ? APPENDECTOMY  1947  ? "ruptured"  ? CARDIAC CATHETERIZATION  10/23/1999; 09/2014  ? EF 60% No significant CAD; patent coronary arteries.  ? CARPAL TUNNEL RELEASE Right 1990's  ? CHOLECYSTECTOMY N/A 01/29/2015  ? Procedure: LAPAROSCOPIC CHOLECYSTECTOMY;  Surgeon: Rolm Bookbinder, MD;  Location: [facility];  Service: General;  Laterality: N/A;  ? DILATION AND CURETTAGE OF UTERUS  "couple"  ?  ERCP N/A 09/13/2021  ? Procedure: ENDOSCOPIC RETROGRADE CHOLANGIOPANCREATOGRAPHY (ERCP);  Surgeon: Carol Ada, MD;  Location: [facility];  Service: Gastroenterology;  Laterality: N/A;  ? ESOPHAGOGASTRODUODENOSCOPY (EGD) WITH ESOPHAGEAL DILATION  2-3 times  ? HEMORROIDECTOMY    ? KNEE ARTHROSCOPY Bilateral   ? right x 2  ? LAPAROSCOPIC CHOLECYSTECTOMY  01/29/2015  ? LEFT HEART CATHETERIZATION WITH CORONARY ANGIOGRAM N/A 09/16/2014  ? Procedure: LEFT HEART CATHETERIZATION WITH CORONARY ANGIOGRAM;  Surgeon: [name], MD;  Location: [facility] C[facility];  Service: Cardiovascular;  Laterality: N/A;  ? NM MYOVIEW LTD  06/01/2012  ? EF 71%  ? REMOVAL OF STONES  09/13/2021  ? Procedure: REMOVAL OF STONES;  Surgeon: Carol Ada, MD;  Location: [facility];  Service: Gastroenterology;;  ? [name]  09/13/2021  ? Procedure: SPHINCTEROTOMY;  Surgeon: Carol Ada, MD;  Location: [facility];  Service: Gastroenterology;;  ? VAGINAL HYSTERECTOMY  1970's  ? ? ?Allergies  ?Allergen Reactions   ? Contrast Media [Iodinated Contrast Media] Itching and Rash  ?  Pt covered in rash, red.  ? Codeine Nausea And Vomiting  ?  in large doses: can tolerate Tussionex  ? ? ?Outpatient Encounter Medications as of 10/01/2021  ?Medication Sig  ? acetaminophen (TYLENOL) 650 MG suppository Place 650 mg rectally every 4 (four) hours as needed for fever.  ? alum & mag hydroxide-simeth (MAALOX PLUS) 400-400-40 MG/5ML suspension Take 10 mLs by mouth every 6 (six) hours as needed for indigestion or heartburn.  ? amLODipine (NORVASC) 2.5 MG tablet Take 1 tablet (2.5 mg total) by mouth daily.  ? benzonatate (TESSALON) 100 MG capsule Take 1 capsule by mouth 3 (three) times daily.  ? Calcium Carb-Cholecalciferol (HM CALCIUM-VITAMIN D) 600-800 MG-UNIT TABS Take 1 tablet by mouth 2 (two) times daily.  ? Cholecalciferol (VITAMIN D3) 50 MCG (2000 UT) TABS Take 1 capsule by mouth daily.  ? ferrous sulfate 325 (65 FE) MG tablet Take 325 mg by mouth. Monday, Wednesday and Fridays  ? omeprazole (PRILOSEC) 40 MG capsule Take 40 mg by mouth daily.  ? ondansetron (ZOFRAN-ODT) 4 MG disintegrating tablet Take 4 mg by mouth every 6 (six) hours as needed for nausea or vomiting.  ? potassium chloride SA (KLOR-CON M) 20 MEQ tablet Take 20 mEq by mouth 2 (two) times daily.  ? rosuvastatin (CRESTOR) 20 MG tablet Take 1 tablet (20 mg total) by mouth daily.  ? vitamin B-12 (CYANOCOBALAMIN) 1000 MCG tablet Take 1,000 mcg by mouth daily.  ? ?No facility-administered encounter medications on file as of 10/01/2021.  ? ? ?Review of Systems  ?Constitutional:  Positive for fatigue. Negative for appetite change and fever.  ?HENT:  Positive for hearing loss. Negative for congestion and trouble swallowing.   ?Respiratory:  Positive for cough. Negative for chest tightness and shortness of breath.   ?Cardiovascular:  Positive for leg swelling.  ?Gastrointestinal:  Negative for abdominal pain, constipation, nausea and vomiting.  ?Genitourinary:  Positive for  frequency. Negative for dysuria and urgency.  ?     Baseline urinary frequency  ?Musculoskeletal:  Positive for gait problem.  ?Skin:  Negative for color change.  ?Neurological:  Negative for speech difficulty, weakness and light-headedness.  ?     Memory lapses.   ?Psychiatric/Behavioral:  Negative for behavioral problems and sleep disturbance. The patient is not nervous/anxious.   ? ?Immunization History  ?Administered Date(s) Administered  ? Influenza Split 03/16/2011, 03/01/2012  ? Influenza Whole 03/28/2007, 04/01/2009, 02/18/2010  ? Influenza, High Dose Seasonal PF 03/14/2013  ?  Influenza,inj,Quad PF,6+ Mos 03/20/2014, 03/11/2015  ? Influenza-Unspecified 08/06/2018, 03/20/2020  ? Moderna SARS-COV2 Booster Vaccination 11/04/2020  ? Moderna Sars-Covid-2 Vaccination 06/11/2019, 07/09/2019, 02/24/2021  ? Pneumococcal Conjugate-13 02/25/2015  ? Pneumococcal Polysaccharide-23 03/28/2007  ? Td 12/20/2005  ? ?Pertinent  Health Maintenance Due  ?Topic Date Due  ? MAMMOGRAM  09/15/2013  ? INFLUENZA VACCINE  01/05/2022  ? DEXA SCAN  Completed  ? ? ?  09/14/2021  ? 12:00 PM 09/14/2021  ?  8:15 PM 09/15/2021  ?  3:00 PM 09/15/2021  ?  8:00 PM 09/16/2021  ? 10:06 AM  ?Fall Risk  ?Patient Fall Risk Level High fall risk High fall risk High fall risk High fall risk High fall risk  ? ?Functional Status Survey: ?  ? ?Vitals:  ? 10/01/21 0937  ?BP: 138/85  ?Pulse: 73  ?Resp: 20  ?Temp: (!) 97.5 ?F (36.4 ?C)  ?SpO2: 96%  ?Weight: 174 lb (78.9 kg)  ?Height: 5' (1.524 m)  ? ?Body mass index is 33.98 kg/m?[name] ?Physical Exam ?Constitutional:   ?   Appearance: Normal appearance.  ?HENT:  ?   Head: Normocephalic and atraumatic.  ?   Mouth/Throat:  ?   Mouth: Mucous membranes are moist.  ?Eyes:  ?   Extraocular Movements: Extraocular movements intact.  ?   Conjunctiva/sclera: Conjunctivae normal.  ?   Pupils: Pupils are equal, round, and reactive to light.  ?Cardiovascular:  ?   Rate and Rhythm: Normal rate and regular rhythm.  ?   Heart  sounds: No murmur heard. ?Pulmonary:  ?   Effort: Pulmonary effort is normal.  ?   Breath sounds: No rales.  ?Abdominal:  ?   General: Bowel sounds are normal.  ?   Palpations: Abdomen is soft.  ?   Tenderness: There

## 2021-10-01 NOTE — Assessment & Plan Note (Signed)
on KCL, K 3.9 09/22/21 ?

## 2021-10-01 NOTE — Assessment & Plan Note (Deleted)
better indigestion, burping, on Omeprazole. prn Maalox 

## 2021-10-01 NOTE — Assessment & Plan Note (Signed)
Blood pressure is controlled, continue Amlodipine.  

## 2021-10-01 NOTE — Assessment & Plan Note (Signed)
better indigestion, burping, on Omeprazole. prn Maalox 

## 2021-10-01 NOTE — Assessment & Plan Note (Signed)
off Furosemide, not new,  EF 50-55% 06/01/2017.  

## 2021-10-01 NOTE — Assessment & Plan Note (Signed)
gradual decline, 06/26/20 MMSE 23/30, failed clock drawing, needs higher level of care.  ?

## 2021-10-01 NOTE — Assessment & Plan Note (Signed)
LDL 77 06/18/21, takes Rosuvastatin ?

## 2021-10-01 NOTE — Assessment & Plan Note (Signed)
Recovered, asymptomatic, getting stronger.  ?Hospitalized 09/12/21-09/16/21, choledocholithiasis/ascending cholangitis s/p ERCP and stone removal. Severe sepsis/E coli bacteremia- fully treated.  ?

## 2021-10-01 NOTE — Assessment & Plan Note (Signed)
Vit B12 228 04/03/20, added Vit B 216/22, on Fe, Hgb 11.5 09/16/21 ?  ?

## 2021-10-02 ENCOUNTER — Encounter: Payer: Self-pay | Admitting: Nurse Practitioner

## 2021-10-02 DIAGNOSIS — M6281 Muscle weakness (generalized): Secondary | ICD-10-CM | POA: Diagnosis not present

## 2021-10-02 DIAGNOSIS — R29898 Other symptoms and signs involving the musculoskeletal system: Secondary | ICD-10-CM | POA: Diagnosis not present

## 2021-10-02 DIAGNOSIS — R41841 Cognitive communication deficit: Secondary | ICD-10-CM | POA: Diagnosis not present

## 2021-10-02 DIAGNOSIS — R6520 Severe sepsis without septic shock: Secondary | ICD-10-CM | POA: Diagnosis not present

## 2021-10-02 DIAGNOSIS — R296 Repeated falls: Secondary | ICD-10-CM | POA: Diagnosis not present

## 2021-10-02 DIAGNOSIS — K8309 Other cholangitis: Secondary | ICD-10-CM | POA: Diagnosis not present

## 2021-10-05 ENCOUNTER — Non-Acute Institutional Stay: Payer: Medicare Other | Admitting: Internal Medicine

## 2021-10-05 ENCOUNTER — Encounter: Payer: Self-pay | Admitting: Internal Medicine

## 2021-10-05 DIAGNOSIS — D509 Iron deficiency anemia, unspecified: Secondary | ICD-10-CM | POA: Diagnosis not present

## 2021-10-05 DIAGNOSIS — N184 Chronic kidney disease, stage 4 (severe): Secondary | ICD-10-CM | POA: Diagnosis not present

## 2021-10-05 DIAGNOSIS — I10 Essential (primary) hypertension: Secondary | ICD-10-CM

## 2021-10-05 DIAGNOSIS — R634 Abnormal weight loss: Secondary | ICD-10-CM | POA: Diagnosis not present

## 2021-10-05 DIAGNOSIS — R531 Weakness: Secondary | ICD-10-CM

## 2021-10-05 DIAGNOSIS — E785 Hyperlipidemia, unspecified: Secondary | ICD-10-CM

## 2021-10-05 DIAGNOSIS — R609 Edema, unspecified: Secondary | ICD-10-CM | POA: Diagnosis not present

## 2021-10-05 LAB — BASIC METABOLIC PANEL
BUN: 23 — AB (ref 4–21)
CO2: 21 (ref 13–22)
Chloride: 107 (ref 99–108)
Creatinine: 2.2 — AB (ref 0.5–1.1)
Glucose: 80
Potassium: 4.6 mEq/L (ref 3.5–5.1)
Sodium: 140 (ref 137–147)

## 2021-10-05 LAB — COMPREHENSIVE METABOLIC PANEL
Calcium: 9.6 (ref 8.7–10.7)
eGFR: 20

## 2021-10-05 NOTE — Progress Notes (Signed)
? ?Location: [facility] ?  ?Place of Service:  SNF (31) ? ?Provider:  ? ?Code Status: DNR ?Goals of Care:  ? ?  10/01/2021  ?  9:17 AM  ?Advanced Directives  ?Does Patient Have a Medical Advance Directive? Yes  ?Type of Advance Directive Healthcare Power of Attorney  ?Does patient want to make changes to medical advance directive? No - Patient declined  ?Copy of [place] in Chart? Yes - validated most recent copy scanned in chart (See row information)  ? ? ? ?Chief Complaint  ?Patient presents with  ? Acute Visit  ? ? ?HPI: Patient is a 88 y.o. female seen today for an acute visit for Weight loss and Failure to thrive  ? ?Patient has a history of hypertension, hyperlipidemia, dementia, B12 deficiency ?Also h/o Chronic Cough ? ?Was send to ED for altered mental status and weakness. ?Was found to have elevated liver enzymes with elevated WBC.  CBD was enlarged.   ?Underwent ERCP with stone extraction on 04/9 ?Was also found with E. coli bacteremia.  Was seen by Dr. Drucilla Schmidt.  Was switched to p.o. Augmentin for 7 days ?Patient was also found to have acute renal insufficiency was hydrated with IV fluids her creatinine did come down close to her baseline ? ?Patient was discharged to AL back to her room.  But since patient has been here she has been very weak unable to do her ADLs.  In past 1 week patient has also not eating.  She has lost almost 30 pounds in past since my last visit in February from 190-160 today ?Patient looks very frail  ?denied any pain nausea vomiting.  She said she has no appetite.  Per nurses she is not coming out of her room sitting and not walking that much with her walker  ?this is all new for her. ? ? ? ?Past Medical History:  ?Diagnosis Date  ? Arthritis   ? "right thumb" (01/29/2015)  ? Chronic shoulder pain   ? "between my shoulders" (01/29/2015)  ? Dizziness   ? Esophageal stricture 2009  ? Gallstones   ? GERD (gastroesophageal reflux disease) 2004  ? Hemorrhoids  2004  ? Hiatal hernia 2004  ? History of blood transfusion 1947  ? "appendix ruptured"  ? HOH (hard of hearing)   ? Hypertension   ? Rhinitis, allergic   ? Syncopal episodes   ? ? ?Past Surgical History:  ?Procedure Laterality Date  ? APPENDECTOMY  1947  ? "ruptured"  ? CARDIAC CATHETERIZATION  10/23/1999; 09/2014  ? EF 60% No significant CAD; patent coronary arteries.  ? CARPAL TUNNEL RELEASE Right 1990's  ? CHOLECYSTECTOMY N/A 01/29/2015  ? Procedure: LAPAROSCOPIC CHOLECYSTECTOMY;  Surgeon: Rolm Bookbinder, MD;  Location: [facility];  Service: General;  Laterality: N/A;  ? DILATION AND CURETTAGE OF UTERUS  "couple"  ? ERCP N/A 09/13/2021  ? Procedure: ENDOSCOPIC RETROGRADE CHOLANGIOPANCREATOGRAPHY (ERCP);  Surgeon: Carol Ada, MD;  Location: [facility];  Service: Gastroenterology;  Laterality: N/A;  ? ESOPHAGOGASTRODUODENOSCOPY (EGD) WITH ESOPHAGEAL DILATION  2-3 times  ? HEMORROIDECTOMY    ? KNEE ARTHROSCOPY Bilateral   ? right x 2  ? LAPAROSCOPIC CHOLECYSTECTOMY  01/29/2015  ? LEFT HEART CATHETERIZATION WITH CORONARY ANGIOGRAM N/A 09/16/2014  ? Procedure: LEFT HEART CATHETERIZATION WITH CORONARY ANGIOGRAM;  Surgeon: [name], MD;  Location: [facility] CATH LAB;  Service: Cardiovascular;  Laterality: N/A;  ? NM MYOVIEW LTD  06/01/2012  ? EF 71%  ? REMOVAL OF STONES  09/13/2021  ? Procedure: REMOVAL OF STONES;  Surgeon: Carol Ada, MD;  Location: [facility];  Service: Gastroenterology;;  ? [name]  09/13/2021  ? Procedure: SPHINCTEROTOMY;  Surgeon: Carol Ada, MD;  Location: [facility];  Service: Gastroenterology;;  ? VAGINAL HYSTERECTOMY  1970's  ? ? ?Allergies  ?Allergen Reactions  ? Contrast Media [Iodinated Contrast Media] Itching and Rash  ?  Pt covered in rash, red.  ? Codeine Nausea And Vomiting  ?  in large doses: can tolerate Tussionex  ? ? ?Outpatient Encounter Medications as of 10/05/2021  ?Medication Sig  ? acetaminophen (TYLENOL) 650 MG suppository Place 650 mg rectally every 4 (four) hours as  needed for fever.  ? alum & mag hydroxide-simeth (MAALOX PLUS) 400-400-40 MG/5ML suspension Take 10 mLs by mouth every 6 (six) hours as needed for indigestion or heartburn.  ? amLODipine (NORVASC) 2.5 MG tablet Take 1 tablet (2.5 mg total) by mouth daily.  ? benzonatate (TESSALON) 100 MG capsule Take 1 capsule by mouth 3 (three) times daily.  ? Cholecalciferol (VITAMIN D3) 50 MCG (2000 UT) TABS Take 1 capsule by mouth daily.  ? omeprazole (PRILOSEC) 40 MG capsule Take 40 mg by mouth daily.  ? ondansetron (ZOFRAN-ODT) 4 MG disintegrating tablet Take 4 mg by mouth every 6 (six) hours as needed for nausea or vomiting.  ? vitamin B-12 (CYANOCOBALAMIN) 1000 MCG tablet Take 1,000 mcg by mouth daily.  ? [DISCONTINUED] Calcium Carb-Cholecalciferol (HM CALCIUM-VITAMIN D) 600-800 MG-UNIT TABS Take 1 tablet by mouth 2 (two) times daily.  ? [DISCONTINUED] ferrous sulfate 325 (65 FE) MG tablet Take 325 mg by mouth. Monday, Wednesday and Fridays  ? [DISCONTINUED] potassium chloride SA (KLOR-CON M) 20 MEQ tablet Take 20 mEq by mouth 2 (two) times daily.  ? [DISCONTINUED] rosuvastatin (CRESTOR) 20 MG tablet Take 1 tablet (20 mg total) by mouth daily.  ? ?No facility-administered encounter medications on file as of 10/05/2021.  ? ? ?Review of Systems:  ?Review of Systems  ?Constitutional:  Positive for activity change, appetite change and unexpected weight change.  ?HENT: Negative.    ?Respiratory:  Negative for cough and shortness of breath.   ?Cardiovascular:  Negative for leg swelling.  ?Gastrointestinal:  Negative for constipation.  ?Genitourinary: Negative.   ?Musculoskeletal:  Positive for gait problem. Negative for arthralgias and myalgias.  ?Skin: Negative.   ?Neurological:  Positive for weakness. Negative for dizziness.  ?Psychiatric/Behavioral:  Positive for confusion. Negative for dysphoric mood and sleep disturbance.   ? ?Health Maintenance  ?Topic Date Due  ? Zoster Vaccines- Shingrix (1 of 2) Never done  ? MAMMOGRAM   09/15/2013  ? TETANUS/TDAP  12/21/2015  ? COVID-19 Vaccine (4 - Booster for Moderna series) 04/21/2021  ? INFLUENZA VACCINE  01/05/2022  ? Pneumonia Vaccine 58+ Years old  Completed  ? DEXA SCAN  Completed  ? HPV VACCINES  Aged Out  ? ? ?Physical Exam: ?Vitals:  ? 10/05/21 1906  ?BP: 138/65  ?Pulse: 73  ?Resp: 20  ?Temp: 97.7 ?F (36.5 ?C)  ?SpO2: 95%  ?Weight: 161 lb (73 kg)  ? ?Body mass index is 31.44 kg/m?[name] ?Physical Exam ?Vitals reviewed.  ?HENT:  ?   Head: Normocephalic.  ?   Nose: Nose normal.  ?   Mouth/Throat:  ?   Mouth: Mucous membranes are moist.  ?   Pharynx: Oropharynx is clear.  ?Eyes:  ?   Pupils: Pupils are equal, round, and reactive to light.  ?Cardiovascular:  ?   Rate and Rhythm: Normal  rate and regular rhythm.  ?   Pulses: Normal pulses.  ?   Heart sounds: Normal heart sounds. No murmur heard. ?Pulmonary:  ?   Effort: Pulmonary effort is normal.  ?   Breath sounds: Normal breath sounds.  ?Abdominal:  ?   General: Abdomen is flat. Bowel sounds are normal.  ?   Palpations: Abdomen is soft.  ?Musculoskeletal:     ?   General: No swelling.  ?   Cervical back: Neck supple.  ?Skin: ?   General: Skin is warm.  ?Neurological:  ?   General: No focal deficit present.  ?   Mental Status: She is alert.  ?   Comments: Patient was able to stand up with her walker but was very weak.  Denied any dizziness  ?Psychiatric:     ?   Mood and Affect: Mood normal.     ?   Thought Content: Thought content normal.  ? ? ?Labs reviewed: ?Basic Metabolic Panel: ?Recent Labs  ?  09/12/21 ?2108 09/13/21 ?9147 09/14/21 ?0908 09/15/21 ?8295 09/16/21 ?0500  ?NA 138 139 137 135 142  ?K 3.2* 4.2 4.3 3.9 3.5  ?CL 101 103 110 112* 117*  ?CO2 23 25 17* 17* 19*  ?GLUCOSE 106* 108* 161* 108* 81  ?BUN 24* 24* 33* 30* 28*  ?CREATININE 2.20* 2.10* 2.54* 2.21* 1.89*  ?CALCIUM 9.2 8.6* 7.6* 7.6* 8.3*  ?MG 2.5* 2.3  --   --   --   ?PHOS  --   --   --   --  3.0  ?TSH  --  1.313  --   --   --   ? ?Liver Function Tests: ?Recent Labs  ?   09/13/21 ?0443 09/14/21 ?0908 09/15/21 ?6213 09/16/21 ?0500  ?AST 157* 69* 41  --   ?ALT 78* 58* 41  --   ?ALKPHOS 838* 672* 551*  --   ?BILITOT 5.1* 1.5* 1.1  --   ?PROT 5.6* 5.5* 5.3*  --   ?ALBUMIN 2.7* 2.6*

## 2021-10-06 DIAGNOSIS — R630 Anorexia: Secondary | ICD-10-CM | POA: Diagnosis not present

## 2021-10-06 DIAGNOSIS — R29898 Other symptoms and signs involving the musculoskeletal system: Secondary | ICD-10-CM | POA: Diagnosis not present

## 2021-10-06 DIAGNOSIS — R627 Adult failure to thrive: Secondary | ICD-10-CM | POA: Diagnosis not present

## 2021-10-06 DIAGNOSIS — R7881 Bacteremia: Secondary | ICD-10-CM | POA: Diagnosis not present

## 2021-10-06 DIAGNOSIS — R531 Weakness: Secondary | ICD-10-CM | POA: Diagnosis not present

## 2021-10-06 DIAGNOSIS — R2681 Unsteadiness on feet: Secondary | ICD-10-CM | POA: Diagnosis not present

## 2021-10-06 DIAGNOSIS — E876 Hypokalemia: Secondary | ICD-10-CM | POA: Diagnosis not present

## 2021-10-06 DIAGNOSIS — B9620 Unspecified Escherichia coli [E. coli] as the cause of diseases classified elsewhere: Secondary | ICD-10-CM | POA: Diagnosis not present

## 2021-10-06 DIAGNOSIS — N179 Acute kidney failure, unspecified: Secondary | ICD-10-CM | POA: Diagnosis not present

## 2021-10-06 DIAGNOSIS — R634 Abnormal weight loss: Secondary | ICD-10-CM | POA: Diagnosis not present

## 2021-10-06 DIAGNOSIS — R296 Repeated falls: Secondary | ICD-10-CM | POA: Diagnosis not present

## 2021-10-06 DIAGNOSIS — K8309 Other cholangitis: Secondary | ICD-10-CM | POA: Diagnosis not present

## 2021-10-06 DIAGNOSIS — R1312 Dysphagia, oropharyngeal phase: Secondary | ICD-10-CM | POA: Diagnosis not present

## 2021-10-15 ENCOUNTER — Non-Acute Institutional Stay (SKILLED_NURSING_FACILITY): Payer: Medicare Other | Admitting: Internal Medicine

## 2021-10-15 DIAGNOSIS — R627 Adult failure to thrive: Secondary | ICD-10-CM

## 2021-10-15 DIAGNOSIS — R630 Anorexia: Secondary | ICD-10-CM

## 2021-10-15 DIAGNOSIS — R634 Abnormal weight loss: Secondary | ICD-10-CM | POA: Diagnosis not present

## 2021-10-15 DIAGNOSIS — R531 Weakness: Secondary | ICD-10-CM | POA: Diagnosis not present

## 2021-10-16 ENCOUNTER — Encounter: Payer: Self-pay | Admitting: Internal Medicine

## 2021-10-16 NOTE — Progress Notes (Signed)
? ?Location: [facility] ?  ?Place of Service:  SNF (31) ? ?Provider:  ? ?Code Status: DNR ?Goals of Care:  ? ?  10/01/2021  ?  9:17 AM  ?Advanced Directives  ?Does Patient Have a Medical Advance Directive? Yes  ?Type of Advance Directive Healthcare Power of Attorney  ?Does patient want to make changes to medical advance directive? No - Patient declined  ?Copy of [place] in Chart? Yes - validated most recent copy scanned in chart (See row information)  ? ? ? ?Chief Complaint  ?Patient presents with  ? Acute Visit  ? ? ?HPI: Patient is a 88 y.o. female seen today for an acute visit for Continues to loose weight and has poor appetite ? ?Patient has a history of hypertension, hyperlipidemia, dementia, B12 deficiency ?Also h/o Chronic Cough ?  ?Was admitted in the hospital in 04/23 for Acute Cholangitis and Septicemia ?Underwent ERCP and stone extraction ? ?Since then patient has lost 30 lbs. Continue to not eat. Says she has no Appetite ?Got weak  ?Wt Readings from Last 3 Encounters:  ?10/16/21 155 lb (70.3 kg)  ?10/05/21 161 lb (73 kg)  ?10/01/21 174 lb (78.9 kg)  ?  ?Has lost 10 more pounds since my last visit ?Niece wants to try Remeron ? ? ?Past Medical History:  ?Diagnosis Date  ? Arthritis   ? "right thumb" (01/29/2015)  ? Chronic shoulder pain   ? "between my shoulders" (01/29/2015)  ? Dizziness   ? Esophageal stricture 2009  ? Gallstones   ? GERD (gastroesophageal reflux disease) 2004  ? Hemorrhoids 2004  ? Hiatal hernia 2004  ? History of blood transfusion 1947  ? "appendix ruptured"  ? HOH (hard of hearing)   ? Hypertension   ? Rhinitis, allergic   ? Syncopal episodes   ? ? ?Past Surgical History:  ?Procedure Laterality Date  ? APPENDECTOMY  1947  ? "ruptured"  ? CARDIAC CATHETERIZATION  10/23/1999; 09/2014  ? EF 60% No significant CAD; patent coronary arteries.  ? CARPAL TUNNEL RELEASE Right 1990's  ? CHOLECYSTECTOMY N/A 01/29/2015  ? Procedure: LAPAROSCOPIC CHOLECYSTECTOMY;   Surgeon: Rolm Bookbinder, MD;  Location: [place];  Service: General;  Laterality: N/A;  ? DILATION AND CURETTAGE OF UTERUS  "couple"  ? ERCP N/A 09/13/2021  ? Procedure: ENDOSCOPIC RETROGRADE CHOLANGIOPANCREATOGRAPHY (ERCP);  Surgeon: Carol Ada, MD;  Location: [facility];  Service: Gastroenterology;  Laterality: N/A;  ? ESOPHAGOGASTRODUODENOSCOPY (EGD) WITH ESOPHAGEAL DILATION  2-3 times  ? HEMORROIDECTOMY    ? KNEE ARTHROSCOPY Bilateral   ? right x 2  ? LAPAROSCOPIC CHOLECYSTECTOMY  01/29/2015  ? LEFT HEART CATHETERIZATION WITH CORONARY ANGIOGRAM N/A 09/16/2014  ? Procedure: LEFT HEART CATHETERIZATION WITH CORONARY ANGIOGRAM;  Surgeon: [name], MD;  Location: [facility] CATH LAB;  Service: Cardiovascular;  Laterality: N/A;  ? NM MYOVIEW LTD  06/01/2012  ? EF 71%  ? REMOVAL OF STONES  09/13/2021  ? Procedure: REMOVAL OF STONES;  Surgeon: Carol Ada, MD;  Location: [facility];  Service: Gastroenterology;;  ? [name]  09/13/2021  ? Procedure: SPHINCTEROTOMY;  Surgeon: Carol Ada, MD;  Location: [facility];  Service: Gastroenterology;;  ? VAGINAL HYSTERECTOMY  1970's  ? ? ?Allergies  ?Allergen Reactions  ? Contrast Media [Iodinated Contrast Media] Itching and Rash  ?  Pt covered in rash, red.  ? Codeine Nausea And Vomiting  ?  in large doses: can tolerate Tussionex  ? ? ?Outpatient Encounter Medications as of 10/15/2021  ?Medication  Sig  ? acetaminophen (TYLENOL) 650 MG suppository Place 650 mg rectally every 4 (four) hours as needed for fever.  ? alum & mag hydroxide-simeth (MAALOX PLUS) 400-400-40 MG/5ML suspension Take 10 mLs by mouth every 6 (six) hours as needed for indigestion or heartburn.  ? amLODipine (NORVASC) 2.5 MG tablet Take 1 tablet (2.5 mg total) by mouth daily.  ? benzonatate (TESSALON) 100 MG capsule Take 1 capsule by mouth 3 (three) times daily.  ? Cholecalciferol (VITAMIN D3) 50 MCG (2000 UT) TABS Take 1 capsule by mouth daily.  ? omeprazole (PRILOSEC) 40 MG capsule Take 40 mg by  mouth daily.  ? ondansetron (ZOFRAN-ODT) 4 MG disintegrating tablet Take 4 mg by mouth every 6 (six) hours as needed for nausea or vomiting.  ? vitamin B-12 (CYANOCOBALAMIN) 1000 MCG tablet Take 1,000 mcg by mouth daily.  ? ?No facility-administered encounter medications on file as of 10/15/2021.  ? ? ?Review of Systems:  ?Review of Systems  ?Constitutional:  Positive for activity change, appetite change and unexpected weight change.  ?HENT: Negative.    ?Respiratory:  Negative for cough and shortness of breath.   ?Cardiovascular:  Negative for leg swelling.  ?Gastrointestinal:  Negative for constipation.  ?Genitourinary: Negative.   ?Musculoskeletal:  Positive for gait problem. Negative for arthralgias and myalgias.  ?Skin: Negative.   ?Neurological:  Positive for weakness. Negative for dizziness.  ?Psychiatric/Behavioral:  Positive for confusion. Negative for dysphoric mood and sleep disturbance.   ? ?Health Maintenance  ?Topic Date Due  ? Zoster Vaccines- Shingrix (1 of 2) Never done  ? MAMMOGRAM  09/15/2013  ? TETANUS/TDAP  12/21/2015  ? COVID-19 Vaccine (4 - Booster for Moderna series) 04/21/2021  ? INFLUENZA VACCINE  01/05/2022  ? Pneumonia Vaccine 64+ Years old  Completed  ? DEXA SCAN  Completed  ? HPV VACCINES  Aged Out  ? ? ?Physical Exam: ?Vitals:  ? 10/16/21 1319  ?BP: 123/75  ?Pulse: 73  ?Resp: 17  ?Temp: (!) 97.3 ?F (36.3 ?C)  ?Weight: 155 lb (70.3 kg)  ? ?Body mass index is 30.27 kg/m?[name] ?Physical Exam ?Vitals reviewed.  ?Constitutional:   ?   Appearance: Normal appearance.  ?HENT:  ?   Head: Normocephalic.  ?   Nose: Nose normal.  ?   Mouth/Throat:  ?   Mouth: Mucous membranes are moist.  ?   Pharynx: Oropharynx is clear.  ?Eyes:  ?   Pupils: Pupils are equal, round, and reactive to light.  ?Cardiovascular:  ?   Rate and Rhythm: Normal rate and regular rhythm.  ?   Pulses: Normal pulses.  ?   Heart sounds: Normal heart sounds. No murmur heard. ?Pulmonary:  ?   Effort: Pulmonary effort is normal.  ?    Breath sounds: Normal breath sounds.  ?Abdominal:  ?   General: Abdomen is flat. Bowel sounds are normal.  ?   Palpations: Abdomen is soft.  ?Musculoskeletal:     ?   General: No swelling.  ?   Cervical back: Neck supple.  ?Skin: ?   General: Skin is warm.  ?Neurological:  ?   General: No focal deficit present.  ?   Mental Status: She is alert.  ?Psychiatric:     ?   Mood and Affect: Mood normal.     ?   Thought Content: Thought content normal.  ? ? ?Labs reviewed: ?Basic Metabolic Panel: ?Recent Labs  ?  09/12/21 ?2108 09/13/21 ?1856 09/14/21 ?0908 09/15/21 ?3149 09/16/21 ?0500  ?NA 138  139 137 135 142  ?K 3.2* 4.2 4.3 3.9 3.5  ?CL 101 103 110 112* 117*  ?CO2 23 25 17* 17* 19*  ?GLUCOSE 106* 108* 161* 108* 81  ?BUN 24* 24* 33* 30* 28*  ?CREATININE 2.20* 2.10* 2.54* 2.21* 1.89*  ?CALCIUM 9.2 8.6* 7.6* 7.6* 8.3*  ?MG 2.5* 2.3  --   --   --   ?PHOS  --   --   --   --  3.0  ?TSH  --  1.313  --   --   --   ? ?Liver Function Tests: ?Recent Labs  ?  09/13/21 ?0443 09/14/21 ?0908 09/15/21 ?7322 09/16/21 ?0500  ?AST 157* 69* 41  --   ?ALT 78* 58* 41  --   ?ALKPHOS 838* 672* 551*  --   ?BILITOT 5.1* 1.5* 1.1  --   ?PROT 5.6* 5.5* 5.3*  --   ?ALBUMIN 2.7* 2.6* 2.5* 2.5*  ? ?Recent Labs  ?  09/12/21 ?2108  ?LIPASE 36  ? ?Recent Labs  ?  09/12/21 ?2258  ?AMMONIA 46*  ? ?CBC: ?Recent Labs  ?  09/14/21 ?0908 09/15/21 ?0254 09/16/21 ?0500  ?WBC 15.1* 16.6* 9.8  ?NEUTROABS 13.7* 13.9* 5.9  ?HGB 13.0 12.1 11.5*  ?HCT 40.8 36.2 35.6*  ?MCV 103.0* 99.5 101.4*  ?PLT 225 208 205  ? ?Lipid Panel: ?Recent Labs  ?  06/18/21 ?0000  ?CHOL 136  ?HDL 34*  ?LDLCALC 77  ?TRIG 151  ? ?Lab Results  ?Component Value Date  ? HGBA1C 5.8 07/11/2017  ? ? ?Procedures since last visit: ?No results found. ? ?Assessment/Plan ?1. Weight loss ?Had d/w niece and her POA before that she is not candidate for detail work up ?CT scan of Abdomen in the hospital was negative for any cancer ?She wants to try Remeron Start on Remeron 7.5 mg QHS ? ?2. Failure to  thrive in adult ?Try Remeron ?If it does not work consider hospice ? ?3. Poor appetite ? ? ?4. Generalized weakness ?Stable in SNF ? ?Other issues ? ?Primary hypertension ?We will continue low-dose of amlodipine ?  ?

## 2021-11-05 DIAGNOSIS — R2681 Unsteadiness on feet: Secondary | ICD-10-CM | POA: Diagnosis not present

## 2021-11-05 DIAGNOSIS — R0602 Shortness of breath: Secondary | ICD-10-CM | POA: Diagnosis not present

## 2021-11-05 DIAGNOSIS — M6281 Muscle weakness (generalized): Secondary | ICD-10-CM | POA: Diagnosis not present

## 2021-11-05 DIAGNOSIS — R531 Weakness: Secondary | ICD-10-CM | POA: Diagnosis not present

## 2021-11-05 DIAGNOSIS — R29898 Other symptoms and signs involving the musculoskeletal system: Secondary | ICD-10-CM | POA: Diagnosis not present

## 2021-11-05 DIAGNOSIS — I129 Hypertensive chronic kidney disease with stage 1 through stage 4 chronic kidney disease, or unspecified chronic kidney disease: Secondary | ICD-10-CM | POA: Diagnosis not present

## 2021-11-05 DIAGNOSIS — K8309 Other cholangitis: Secondary | ICD-10-CM | POA: Diagnosis not present

## 2021-11-06 DIAGNOSIS — R0602 Shortness of breath: Secondary | ICD-10-CM | POA: Diagnosis not present

## 2021-11-06 DIAGNOSIS — R2681 Unsteadiness on feet: Secondary | ICD-10-CM | POA: Diagnosis not present

## 2021-11-06 DIAGNOSIS — I129 Hypertensive chronic kidney disease with stage 1 through stage 4 chronic kidney disease, or unspecified chronic kidney disease: Secondary | ICD-10-CM | POA: Diagnosis not present

## 2021-11-06 DIAGNOSIS — R29898 Other symptoms and signs involving the musculoskeletal system: Secondary | ICD-10-CM | POA: Diagnosis not present

## 2021-11-06 DIAGNOSIS — M6281 Muscle weakness (generalized): Secondary | ICD-10-CM | POA: Diagnosis not present

## 2021-11-06 DIAGNOSIS — K8309 Other cholangitis: Secondary | ICD-10-CM | POA: Diagnosis not present

## 2021-11-09 ENCOUNTER — Encounter: Payer: Self-pay | Admitting: Adult Health

## 2021-11-09 ENCOUNTER — Non-Acute Institutional Stay: Payer: Medicare Other | Admitting: Adult Health

## 2021-11-09 DIAGNOSIS — R600 Localized edema: Secondary | ICD-10-CM | POA: Diagnosis not present

## 2021-11-09 DIAGNOSIS — N184 Chronic kidney disease, stage 4 (severe): Secondary | ICD-10-CM | POA: Diagnosis not present

## 2021-11-09 DIAGNOSIS — R29898 Other symptoms and signs involving the musculoskeletal system: Secondary | ICD-10-CM | POA: Diagnosis not present

## 2021-11-09 DIAGNOSIS — R0602 Shortness of breath: Secondary | ICD-10-CM | POA: Diagnosis not present

## 2021-11-09 DIAGNOSIS — I10 Essential (primary) hypertension: Secondary | ICD-10-CM | POA: Diagnosis not present

## 2021-11-09 DIAGNOSIS — M6281 Muscle weakness (generalized): Secondary | ICD-10-CM | POA: Diagnosis not present

## 2021-11-09 DIAGNOSIS — I129 Hypertensive chronic kidney disease with stage 1 through stage 4 chronic kidney disease, or unspecified chronic kidney disease: Secondary | ICD-10-CM | POA: Diagnosis not present

## 2021-11-09 DIAGNOSIS — K8309 Other cholangitis: Secondary | ICD-10-CM | POA: Diagnosis not present

## 2021-11-09 DIAGNOSIS — R2681 Unsteadiness on feet: Secondary | ICD-10-CM | POA: Diagnosis not present

## 2021-11-09 NOTE — Progress Notes (Signed)
Location:  [place] Room Number: 480-X Place of Service:  ALF [phone]) Provider:  Durenda Age, DNP, FNP-BC  Patient Care Team: [name], MD as PCP - General (Internal Medicine) [name], MD as Consulting Physician (Cardiology)  Extended Emergency Contact Information Primary Emergency Contact: Osborne Casco Address: [address]          [place], [place] [address] [place] Phone: [phone] Mobile Phone: [phone] Relation: Niece  Code Status:  DNR  Goals of care: Advanced Directive information    11/09/2021    9:59 AM  Advanced Directives  Does Patient Have a Medical Advance Directive? Yes  Type of Paramedic of [place];Living will;Out of facility DNR (pink MOST or yellow form)  Does patient want to make changes to medical advance directive? No - Patient declined  Copy of [place] in Chart? Yes - validated most recent copy scanned in chart (See row information)     Chief Complaint  Patient presents with   Acute Visit    Bilateral ankle edema     HPI:  Pt is a 88 y.o. female seen today for an acute visit regarding bilateral ankle  2+edema. She denies tenderness on her ankles.No noted erythema. She has CKD stage 4 with GFR 25, BUN 28 and creatinine 1.89. SBP ranging from 114 to 138. She takes Amlodipine 2.5 mg daily for hypertension. She is a resident of [facility].  She has a PMH of laparoscopic cholecystectomy in 2016, hypertension and hyperlipidemia.   Past Medical History:  Diagnosis Date   Arthritis    "right thumb" (01/29/2015)   Chronic shoulder pain    "between my shoulders" (01/29/2015)   Dizziness    Esophageal stricture 2009   Gallstones    GERD (gastroesophageal reflux disease) 2004   Hemorrhoids 2004   Hiatal hernia 2004   History of blood transfusion 1947   "appendix ruptured"   HOH (hard of hearing)     Hypertension    Rhinitis, allergic    Syncopal episodes    Past Surgical History:  Procedure Laterality Date   APPENDECTOMY  1947   "ruptured"   CARDIAC CATHETERIZATION  10/23/1999; 09/2014   EF 60% No significant CAD; patent coronary arteries.   CARPAL TUNNEL RELEASE Right 1990's   CHOLECYSTECTOMY N/A 01/29/2015   Procedure: LAPAROSCOPIC CHOLECYSTECTOMY;  Surgeon: Rolm Bookbinder, MD;  Location: [place];  Service: General;  Laterality: N/A;   DILATION AND CURETTAGE OF UTERUS  "couple"   ERCP N/A 09/13/2021   Procedure: ENDOSCOPIC RETROGRADE CHOLANGIOPANCREATOGRAPHY (ERCP);  Surgeon: Carol Ada, MD;  Location: [facility];  Service: Gastroenterology;  Laterality: N/A;   ESOPHAGOGASTRODUODENOSCOPY (EGD) WITH ESOPHAGEAL DILATION  2-3 times   HEMORROIDECTOMY     KNEE ARTHROSCOPY Bilateral    right x 2   LAPAROSCOPIC CHOLECYSTECTOMY  01/29/2015   LEFT HEART CATHETERIZATION WITH CORONARY ANGIOGRAM N/A 09/16/2014   Procedure: LEFT HEART CATHETERIZATION WITH CORONARY ANGIOGRAM;  Surgeon: [name], MD;  [facility] CATH LAB;  Service: Cardiovascular;  Laterality: N/A;   NM MYOVIEW LTD  06/01/2012   EF 71%   REMOVAL OF STONES  09/13/2021   Procedure: REMOVAL OF STONES;  Surgeon: Carol Ada, MD;  Location: [facility];  Service: Gastroenterology;;   [name]  09/13/2021   Procedure: [name];  Surgeon: Carol Ada, MD;  Location: [facility];  Service: Gastroenterology;;   VAGINAL HYSTERECTOMY  1970's    Allergies  Allergen Reactions   Contrast Media [Iodinated Contrast Media] Itching and Rash    Pt covered in rash, red.   Codeine Nausea And Vomiting    in large doses: can tolerate Tussionex    Outpatient Encounter Medications as of 11/09/2021  Medication Sig   acetaminophen (TYLENOL) 650 MG suppository Place 650 mg rectally every 4 (four) hours as needed for fever.   alum & mag hydroxide-simeth (MAALOX PLUS) 400-400-40 MG/5ML suspension Take 10 mLs by mouth every  6 (six) hours as needed for indigestion or heartburn.   amLODipine (NORVASC) 2.5 MG tablet Take 1 tablet (2.5 mg total) by mouth daily.   benzonatate (TESSALON) 100 MG capsule Take 1 capsule by mouth 3 (three) times daily.   mirtazapine (REMERON) 7.5 MG tablet Take 7.5 mg by mouth at bedtime.   Nutritional Supplements (RESOURCE 2.0) LIQD Take by mouth. 180 ml; oral Twice A Day Special Instructions: administer with med pass   omeprazole (PRILOSEC) 40 MG capsule Take 40 mg by mouth daily.   ondansetron (ZOFRAN-ODT) 4 MG disintegrating tablet Take 4 mg by mouth every 6 (six) hours as needed for nausea or vomiting.   vitamin B-12 (CYANOCOBALAMIN) 1000 MCG tablet Take 1,000 mcg by mouth daily.   [DISCONTINUED] Cholecalciferol (VITAMIN D3) 50 MCG (2000 UT) TABS Take 1 capsule by mouth daily.   No facility-administered encounter medications on file as of 11/09/2021.    Review of Systems  Constitutional:  Negative for appetite change, chills, fatigue and fever.  HENT:  Negative for congestion, hearing loss, rhinorrhea and sore throat.   Eyes: Negative.   Respiratory:  Negative for cough, shortness of breath and wheezing.   Cardiovascular:  Positive for leg swelling. Negative for chest pain and palpitations.  Gastrointestinal:  Negative for abdominal pain, constipation, diarrhea, nausea and vomiting.  Genitourinary:  Negative for dysuria.  Musculoskeletal:  Negative for arthralgias, back pain and myalgias.  Skin:  Negative for color change, rash and wound.  Neurological:  Negative for dizziness, weakness and headaches.  Psychiatric/Behavioral:  Negative for behavioral problems. The patient is not nervous/anxious.        Immunization History  Administered Date(s) Administered   Influenza Split 03/16/2011, 03/01/2012   Influenza Whole 03/28/2007, 04/01/2009, 02/18/2010   Influenza, High Dose Seasonal PF 03/14/2013   Influenza,inj,Quad PF,6+ Mos 03/20/2014, 03/11/2015   Influenza-Unspecified  08/06/2018, 03/20/2020   Moderna SARS-COV2 Booster Vaccination 11/04/2020   Moderna Sars-Covid-2 Vaccination 06/11/2019, 07/09/2019, 02/24/2021   Pneumococcal Conjugate-13 02/25/2015   Pneumococcal Polysaccharide-23 03/28/2007   Td 12/20/2005   Pertinent  Health Maintenance Due  Topic Date Due   MAMMOGRAM  09/15/2013   INFLUENZA VACCINE  01/05/2022   DEXA SCAN  Completed      09/14/2021   12:00 PM 09/14/2021    8:15 PM 09/15/2021    3:00 PM 09/15/2021    8:00 PM 09/16/2021   10:06 AM  Fall Risk  Patient Fall Risk Level _0      Vitals:   11/09/21 0955  BP: 140/80  Pulse: 84  Resp: 20  Temp: (!) 97.5 F (36.4 C)  SpO2: 96%  Weight: 163 lb 12.8 oz (74.3 kg)  Height: 5' (1.524 m)   Body mass index is 31.99 kg/m.  Physical Exam Constitutional:      General: She is not in acute distress.    Appearance: She is obese.  HENT:     Head: Normocephalic and atraumatic.  Comments: Hard of hearing    Left Ear: There is no impacted cerumen.     Nose: Nose normal.     Mouth/Throat:     Mouth: Mucous membranes are moist.  Eyes:     Conjunctiva/sclera: Conjunctivae normal.  Cardiovascular:     Rate and Rhythm: Normal rate and regular rhythm.  Pulmonary:     Effort: Pulmonary effort is normal.     Breath sounds: Normal breath sounds.  Abdominal:     General: Bowel sounds are normal.     Palpations: Abdomen is soft.  Musculoskeletal:        General: Normal range of motion.     Cervical back: Normal range of motion.     Right lower leg: Edema present.     Left lower leg: Edema present.     Comments: BLE 2+edema  Skin:    General: Skin is warm and dry.  Neurological:     General: No focal deficit present.     Mental Status: She is alert. She is disoriented.     Comments: Alert to self and place, disoriented to time.  Psychiatric:        Mood and Affect: Mood normal.        Behavior: Behavior  normal.        Thought Content: Thought content normal.        Judgment: Judgment normal.        Labs reviewed: Recent Labs    09/12/21 2108 09/13/21 0443 09/14/21 0908 09/15/21 0529 09/16/21 0500  NA 138 139 137 135 142  K 3.2* 4.2 4.3 3.9 3.5  CL 101 103 110 112* 117*  CO2 23 25 17* 17* 19*  GLUCOSE 106* 108* 161* 108* 81  BUN 24* 24* 33* 30* 28*  CREATININE 2.20* 2.10* 2.54* 2.21* 1.89*  CALCIUM 9.2 8.6* 7.6* 7.6* 8.3*  MG 2.5* 2.3  --   --   --   PHOS  --   --   --   --  3.0   Recent Labs    09/13/21 0443 09/14/21 0908 09/15/21 0529 09/16/21 0500  AST 157* 69* 41  --   ALT 78* 58* 41  --   ALKPHOS 838* 672* 551*  --   BILITOT 5.1* 1.5* 1.1  --   PROT 5.6* 5.5* 5.3*  --   ALBUMIN 2.7* 2.6* 2.5* 2.5*   Recent Labs    09/14/21 0908 09/15/21 0529 09/16/21 0500  WBC 15.1* 16.6* 9.8  NEUTROABS 13.7* 13.9* 5.9  HGB 13.0 12.1 11.5*  HCT 40.8 36.2 35.6*  MCV 103.0* 99.5 101.4*  PLT 225 208 205   Lab Results  Component Value Date   TSH 1.313 09/13/2021   Lab Results  Component Value Date   HGBA1C 5.8 07/11/2017   Lab Results  Component Value Date   CHOL 136 06/18/2021   HDL 34 (A) 06/18/2021   LDLCALC 77 06/18/2021   LDLDIRECT 161.2 07/18/2013   TRIG 151 06/18/2021   CHOLHDL 2.9 04/03/2020    Significant Diagnostic Results in last 30 days:  No results found.  Assessment/Plan  1. Bilateral lower extremity edema -  ordered bilateral venous ultrasound of BLE to rule out DVT -  instructed to elevate BLE at night -  Ted stockings, knee high, on in am and off at bedtime  2. CKD (chronic kidney disease) stage 4, GFR 15-29 ml/min [facility]) Lab Results  Component Value Date   NA 142 09/16/2021   K 3.5 09/16/2021  CO2 19 (L) 09/16/2021   GLUCOSE 81 09/16/2021   BUN 28 (H) 09/16/2021   CREATININE 1.89 (H) 09/16/2021   CALCIUM 8.3 (L) 09/16/2021   EGFR 25 09/03/2021   GFRNONAA 25 (L) 09/16/2021   -  monitor  3. Primary hypertension -Blood  pressure well controlled Continue current medications   Family/ staff Communication: Discussed plan of care with resident and charge nurse  Labs/tests ordered:   bilateral venous doppler ultrasound and CMP    Durenda Age, DNP, MSN, FNP-BC [facility] [phone] (Monday-Friday 8:00 a.m. - 5:00 p.m.) [phone] (after hours)

## 2021-11-10 DIAGNOSIS — R2681 Unsteadiness on feet: Secondary | ICD-10-CM | POA: Diagnosis not present

## 2021-11-10 DIAGNOSIS — I129 Hypertensive chronic kidney disease with stage 1 through stage 4 chronic kidney disease, or unspecified chronic kidney disease: Secondary | ICD-10-CM | POA: Diagnosis not present

## 2021-11-10 DIAGNOSIS — R29898 Other symptoms and signs involving the musculoskeletal system: Secondary | ICD-10-CM | POA: Diagnosis not present

## 2021-11-10 DIAGNOSIS — K8309 Other cholangitis: Secondary | ICD-10-CM | POA: Diagnosis not present

## 2021-11-10 DIAGNOSIS — M6281 Muscle weakness (generalized): Secondary | ICD-10-CM | POA: Diagnosis not present

## 2021-11-10 DIAGNOSIS — R0602 Shortness of breath: Secondary | ICD-10-CM | POA: Diagnosis not present

## 2021-11-10 DIAGNOSIS — M7989 Other specified soft tissue disorders: Secondary | ICD-10-CM | POA: Diagnosis not present

## 2021-11-10 DIAGNOSIS — R609 Edema, unspecified: Secondary | ICD-10-CM | POA: Diagnosis not present

## 2021-11-10 LAB — BASIC METABOLIC PANEL
BUN: 20 (ref 4–21)
CO2: 27 — AB (ref 13–22)
Chloride: 107 (ref 99–108)
Creatinine: 1.4 — AB (ref 0.5–1.1)
Glucose: 81
Potassium: 4.1 mEq/L (ref 3.5–5.1)
Sodium: 141 (ref 137–147)

## 2021-11-10 LAB — COMPREHENSIVE METABOLIC PANEL
Albumin: 2.8 — AB (ref 3.5–5.0)
Calcium: 8.4 — AB (ref 8.7–10.7)
Globulin: 1.5
eGFR: 37

## 2021-11-10 LAB — HEPATIC FUNCTION PANEL
ALT: 9 U/L (ref 7–35)
AST: 17 (ref 13–35)
Alkaline Phosphatase: 69 (ref 25–125)
Bilirubin, Total: 0.5

## 2021-11-11 DIAGNOSIS — R2681 Unsteadiness on feet: Secondary | ICD-10-CM | POA: Diagnosis not present

## 2021-11-11 DIAGNOSIS — M6281 Muscle weakness (generalized): Secondary | ICD-10-CM | POA: Diagnosis not present

## 2021-11-11 DIAGNOSIS — K8309 Other cholangitis: Secondary | ICD-10-CM | POA: Diagnosis not present

## 2021-11-11 DIAGNOSIS — R0602 Shortness of breath: Secondary | ICD-10-CM | POA: Diagnosis not present

## 2021-11-11 DIAGNOSIS — I129 Hypertensive chronic kidney disease with stage 1 through stage 4 chronic kidney disease, or unspecified chronic kidney disease: Secondary | ICD-10-CM | POA: Diagnosis not present

## 2021-11-11 DIAGNOSIS — R29898 Other symptoms and signs involving the musculoskeletal system: Secondary | ICD-10-CM | POA: Diagnosis not present

## 2021-11-12 DIAGNOSIS — M6281 Muscle weakness (generalized): Secondary | ICD-10-CM | POA: Diagnosis not present

## 2021-11-12 DIAGNOSIS — R2681 Unsteadiness on feet: Secondary | ICD-10-CM | POA: Diagnosis not present

## 2021-11-12 DIAGNOSIS — K8309 Other cholangitis: Secondary | ICD-10-CM | POA: Diagnosis not present

## 2021-11-12 DIAGNOSIS — I129 Hypertensive chronic kidney disease with stage 1 through stage 4 chronic kidney disease, or unspecified chronic kidney disease: Secondary | ICD-10-CM | POA: Diagnosis not present

## 2021-11-12 DIAGNOSIS — R29898 Other symptoms and signs involving the musculoskeletal system: Secondary | ICD-10-CM | POA: Diagnosis not present

## 2021-11-12 DIAGNOSIS — R0602 Shortness of breath: Secondary | ICD-10-CM | POA: Diagnosis not present

## 2021-11-13 DIAGNOSIS — R29898 Other symptoms and signs involving the musculoskeletal system: Secondary | ICD-10-CM | POA: Diagnosis not present

## 2021-11-13 DIAGNOSIS — M6281 Muscle weakness (generalized): Secondary | ICD-10-CM | POA: Diagnosis not present

## 2021-11-13 DIAGNOSIS — R0602 Shortness of breath: Secondary | ICD-10-CM | POA: Diagnosis not present

## 2021-11-13 DIAGNOSIS — R2681 Unsteadiness on feet: Secondary | ICD-10-CM | POA: Diagnosis not present

## 2021-11-13 DIAGNOSIS — I129 Hypertensive chronic kidney disease with stage 1 through stage 4 chronic kidney disease, or unspecified chronic kidney disease: Secondary | ICD-10-CM | POA: Diagnosis not present

## 2021-11-13 DIAGNOSIS — K8309 Other cholangitis: Secondary | ICD-10-CM | POA: Diagnosis not present

## 2021-11-16 DIAGNOSIS — I129 Hypertensive chronic kidney disease with stage 1 through stage 4 chronic kidney disease, or unspecified chronic kidney disease: Secondary | ICD-10-CM | POA: Diagnosis not present

## 2021-11-16 DIAGNOSIS — R29898 Other symptoms and signs involving the musculoskeletal system: Secondary | ICD-10-CM | POA: Diagnosis not present

## 2021-11-16 DIAGNOSIS — R2681 Unsteadiness on feet: Secondary | ICD-10-CM | POA: Diagnosis not present

## 2021-11-16 DIAGNOSIS — K8309 Other cholangitis: Secondary | ICD-10-CM | POA: Diagnosis not present

## 2021-11-16 DIAGNOSIS — R0602 Shortness of breath: Secondary | ICD-10-CM | POA: Diagnosis not present

## 2021-11-16 DIAGNOSIS — M6281 Muscle weakness (generalized): Secondary | ICD-10-CM | POA: Diagnosis not present

## 2021-11-18 DIAGNOSIS — R29898 Other symptoms and signs involving the musculoskeletal system: Secondary | ICD-10-CM | POA: Diagnosis not present

## 2021-11-18 DIAGNOSIS — R0602 Shortness of breath: Secondary | ICD-10-CM | POA: Diagnosis not present

## 2021-11-18 DIAGNOSIS — M6281 Muscle weakness (generalized): Secondary | ICD-10-CM | POA: Diagnosis not present

## 2021-11-18 DIAGNOSIS — R2681 Unsteadiness on feet: Secondary | ICD-10-CM | POA: Diagnosis not present

## 2021-11-18 DIAGNOSIS — I129 Hypertensive chronic kidney disease with stage 1 through stage 4 chronic kidney disease, or unspecified chronic kidney disease: Secondary | ICD-10-CM | POA: Diagnosis not present

## 2021-11-18 DIAGNOSIS — K8309 Other cholangitis: Secondary | ICD-10-CM | POA: Diagnosis not present

## 2021-11-19 DIAGNOSIS — M6281 Muscle weakness (generalized): Secondary | ICD-10-CM | POA: Diagnosis not present

## 2021-11-19 DIAGNOSIS — R29898 Other symptoms and signs involving the musculoskeletal system: Secondary | ICD-10-CM | POA: Diagnosis not present

## 2021-11-19 DIAGNOSIS — K8309 Other cholangitis: Secondary | ICD-10-CM | POA: Diagnosis not present

## 2021-11-19 DIAGNOSIS — R2681 Unsteadiness on feet: Secondary | ICD-10-CM | POA: Diagnosis not present

## 2021-11-19 DIAGNOSIS — R0602 Shortness of breath: Secondary | ICD-10-CM | POA: Diagnosis not present

## 2021-11-19 DIAGNOSIS — I129 Hypertensive chronic kidney disease with stage 1 through stage 4 chronic kidney disease, or unspecified chronic kidney disease: Secondary | ICD-10-CM | POA: Diagnosis not present

## 2021-11-20 DIAGNOSIS — M6281 Muscle weakness (generalized): Secondary | ICD-10-CM | POA: Diagnosis not present

## 2021-11-20 DIAGNOSIS — K8309 Other cholangitis: Secondary | ICD-10-CM | POA: Diagnosis not present

## 2021-11-20 DIAGNOSIS — R2681 Unsteadiness on feet: Secondary | ICD-10-CM | POA: Diagnosis not present

## 2021-11-20 DIAGNOSIS — I129 Hypertensive chronic kidney disease with stage 1 through stage 4 chronic kidney disease, or unspecified chronic kidney disease: Secondary | ICD-10-CM | POA: Diagnosis not present

## 2021-11-20 DIAGNOSIS — R0602 Shortness of breath: Secondary | ICD-10-CM | POA: Diagnosis not present

## 2021-11-20 DIAGNOSIS — R29898 Other symptoms and signs involving the musculoskeletal system: Secondary | ICD-10-CM | POA: Diagnosis not present

## 2021-11-23 DIAGNOSIS — R0602 Shortness of breath: Secondary | ICD-10-CM | POA: Diagnosis not present

## 2021-11-23 DIAGNOSIS — R2681 Unsteadiness on feet: Secondary | ICD-10-CM | POA: Diagnosis not present

## 2021-11-23 DIAGNOSIS — I129 Hypertensive chronic kidney disease with stage 1 through stage 4 chronic kidney disease, or unspecified chronic kidney disease: Secondary | ICD-10-CM | POA: Diagnosis not present

## 2021-11-23 DIAGNOSIS — K8309 Other cholangitis: Secondary | ICD-10-CM | POA: Diagnosis not present

## 2021-11-23 DIAGNOSIS — R29898 Other symptoms and signs involving the musculoskeletal system: Secondary | ICD-10-CM | POA: Diagnosis not present

## 2021-11-23 DIAGNOSIS — M6281 Muscle weakness (generalized): Secondary | ICD-10-CM | POA: Diagnosis not present

## 2021-11-25 DIAGNOSIS — I129 Hypertensive chronic kidney disease with stage 1 through stage 4 chronic kidney disease, or unspecified chronic kidney disease: Secondary | ICD-10-CM | POA: Diagnosis not present

## 2021-11-25 DIAGNOSIS — R0602 Shortness of breath: Secondary | ICD-10-CM | POA: Diagnosis not present

## 2021-11-25 DIAGNOSIS — R2681 Unsteadiness on feet: Secondary | ICD-10-CM | POA: Diagnosis not present

## 2021-11-25 DIAGNOSIS — K8309 Other cholangitis: Secondary | ICD-10-CM | POA: Diagnosis not present

## 2021-11-25 DIAGNOSIS — M6281 Muscle weakness (generalized): Secondary | ICD-10-CM | POA: Diagnosis not present

## 2021-11-25 DIAGNOSIS — R29898 Other symptoms and signs involving the musculoskeletal system: Secondary | ICD-10-CM | POA: Diagnosis not present

## 2021-11-27 DIAGNOSIS — R0602 Shortness of breath: Secondary | ICD-10-CM | POA: Diagnosis not present

## 2021-11-27 DIAGNOSIS — R29898 Other symptoms and signs involving the musculoskeletal system: Secondary | ICD-10-CM | POA: Diagnosis not present

## 2021-11-27 DIAGNOSIS — I129 Hypertensive chronic kidney disease with stage 1 through stage 4 chronic kidney disease, or unspecified chronic kidney disease: Secondary | ICD-10-CM | POA: Diagnosis not present

## 2021-11-27 DIAGNOSIS — K8309 Other cholangitis: Secondary | ICD-10-CM | POA: Diagnosis not present

## 2021-11-27 DIAGNOSIS — M6281 Muscle weakness (generalized): Secondary | ICD-10-CM | POA: Diagnosis not present

## 2021-11-27 DIAGNOSIS — R2681 Unsteadiness on feet: Secondary | ICD-10-CM | POA: Diagnosis not present

## 2021-11-30 DIAGNOSIS — M6281 Muscle weakness (generalized): Secondary | ICD-10-CM | POA: Diagnosis not present

## 2021-11-30 DIAGNOSIS — R0602 Shortness of breath: Secondary | ICD-10-CM | POA: Diagnosis not present

## 2021-11-30 DIAGNOSIS — I129 Hypertensive chronic kidney disease with stage 1 through stage 4 chronic kidney disease, or unspecified chronic kidney disease: Secondary | ICD-10-CM | POA: Diagnosis not present

## 2021-11-30 DIAGNOSIS — R2681 Unsteadiness on feet: Secondary | ICD-10-CM | POA: Diagnosis not present

## 2021-11-30 DIAGNOSIS — K8309 Other cholangitis: Secondary | ICD-10-CM | POA: Diagnosis not present

## 2021-11-30 DIAGNOSIS — R29898 Other symptoms and signs involving the musculoskeletal system: Secondary | ICD-10-CM | POA: Diagnosis not present

## 2021-12-02 DIAGNOSIS — I129 Hypertensive chronic kidney disease with stage 1 through stage 4 chronic kidney disease, or unspecified chronic kidney disease: Secondary | ICD-10-CM | POA: Diagnosis not present

## 2021-12-02 DIAGNOSIS — K8309 Other cholangitis: Secondary | ICD-10-CM | POA: Diagnosis not present

## 2021-12-02 DIAGNOSIS — R0602 Shortness of breath: Secondary | ICD-10-CM | POA: Diagnosis not present

## 2021-12-02 DIAGNOSIS — R2681 Unsteadiness on feet: Secondary | ICD-10-CM | POA: Diagnosis not present

## 2021-12-02 DIAGNOSIS — M6281 Muscle weakness (generalized): Secondary | ICD-10-CM | POA: Diagnosis not present

## 2021-12-02 DIAGNOSIS — R29898 Other symptoms and signs involving the musculoskeletal system: Secondary | ICD-10-CM | POA: Diagnosis not present

## 2021-12-03 DIAGNOSIS — I129 Hypertensive chronic kidney disease with stage 1 through stage 4 chronic kidney disease, or unspecified chronic kidney disease: Secondary | ICD-10-CM | POA: Diagnosis not present

## 2021-12-03 DIAGNOSIS — R0602 Shortness of breath: Secondary | ICD-10-CM | POA: Diagnosis not present

## 2021-12-03 DIAGNOSIS — R2681 Unsteadiness on feet: Secondary | ICD-10-CM | POA: Diagnosis not present

## 2021-12-03 DIAGNOSIS — M6281 Muscle weakness (generalized): Secondary | ICD-10-CM | POA: Diagnosis not present

## 2021-12-03 DIAGNOSIS — R29898 Other symptoms and signs involving the musculoskeletal system: Secondary | ICD-10-CM | POA: Diagnosis not present

## 2021-12-03 DIAGNOSIS — K8309 Other cholangitis: Secondary | ICD-10-CM | POA: Diagnosis not present

## 2021-12-04 DIAGNOSIS — K8309 Other cholangitis: Secondary | ICD-10-CM | POA: Diagnosis not present

## 2021-12-04 DIAGNOSIS — R2681 Unsteadiness on feet: Secondary | ICD-10-CM | POA: Diagnosis not present

## 2021-12-04 DIAGNOSIS — R0602 Shortness of breath: Secondary | ICD-10-CM | POA: Diagnosis not present

## 2021-12-04 DIAGNOSIS — I129 Hypertensive chronic kidney disease with stage 1 through stage 4 chronic kidney disease, or unspecified chronic kidney disease: Secondary | ICD-10-CM | POA: Diagnosis not present

## 2021-12-04 DIAGNOSIS — R29898 Other symptoms and signs involving the musculoskeletal system: Secondary | ICD-10-CM | POA: Diagnosis not present

## 2021-12-04 DIAGNOSIS — M6281 Muscle weakness (generalized): Secondary | ICD-10-CM | POA: Diagnosis not present

## 2021-12-07 DIAGNOSIS — I129 Hypertensive chronic kidney disease with stage 1 through stage 4 chronic kidney disease, or unspecified chronic kidney disease: Secondary | ICD-10-CM | POA: Diagnosis not present

## 2021-12-07 DIAGNOSIS — R29898 Other symptoms and signs involving the musculoskeletal system: Secondary | ICD-10-CM | POA: Diagnosis not present

## 2021-12-07 DIAGNOSIS — R531 Weakness: Secondary | ICD-10-CM | POA: Diagnosis not present

## 2021-12-07 DIAGNOSIS — M6281 Muscle weakness (generalized): Secondary | ICD-10-CM | POA: Diagnosis not present

## 2021-12-07 DIAGNOSIS — R0602 Shortness of breath: Secondary | ICD-10-CM | POA: Diagnosis not present

## 2021-12-07 DIAGNOSIS — R2681 Unsteadiness on feet: Secondary | ICD-10-CM | POA: Diagnosis not present

## 2021-12-07 DIAGNOSIS — K8309 Other cholangitis: Secondary | ICD-10-CM | POA: Diagnosis not present

## 2021-12-09 DIAGNOSIS — R2681 Unsteadiness on feet: Secondary | ICD-10-CM | POA: Diagnosis not present

## 2021-12-09 DIAGNOSIS — R0602 Shortness of breath: Secondary | ICD-10-CM | POA: Diagnosis not present

## 2021-12-09 DIAGNOSIS — R29898 Other symptoms and signs involving the musculoskeletal system: Secondary | ICD-10-CM | POA: Diagnosis not present

## 2021-12-09 DIAGNOSIS — M6281 Muscle weakness (generalized): Secondary | ICD-10-CM | POA: Diagnosis not present

## 2021-12-09 DIAGNOSIS — I129 Hypertensive chronic kidney disease with stage 1 through stage 4 chronic kidney disease, or unspecified chronic kidney disease: Secondary | ICD-10-CM | POA: Diagnosis not present

## 2021-12-09 DIAGNOSIS — K8309 Other cholangitis: Secondary | ICD-10-CM | POA: Diagnosis not present

## 2021-12-10 DIAGNOSIS — R0602 Shortness of breath: Secondary | ICD-10-CM | POA: Diagnosis not present

## 2021-12-10 DIAGNOSIS — K8309 Other cholangitis: Secondary | ICD-10-CM | POA: Diagnosis not present

## 2021-12-10 DIAGNOSIS — M6281 Muscle weakness (generalized): Secondary | ICD-10-CM | POA: Diagnosis not present

## 2021-12-10 DIAGNOSIS — R2681 Unsteadiness on feet: Secondary | ICD-10-CM | POA: Diagnosis not present

## 2021-12-10 DIAGNOSIS — R29898 Other symptoms and signs involving the musculoskeletal system: Secondary | ICD-10-CM | POA: Diagnosis not present

## 2021-12-10 DIAGNOSIS — I129 Hypertensive chronic kidney disease with stage 1 through stage 4 chronic kidney disease, or unspecified chronic kidney disease: Secondary | ICD-10-CM | POA: Diagnosis not present

## 2021-12-11 DIAGNOSIS — R29898 Other symptoms and signs involving the musculoskeletal system: Secondary | ICD-10-CM | POA: Diagnosis not present

## 2021-12-11 DIAGNOSIS — I129 Hypertensive chronic kidney disease with stage 1 through stage 4 chronic kidney disease, or unspecified chronic kidney disease: Secondary | ICD-10-CM | POA: Diagnosis not present

## 2021-12-11 DIAGNOSIS — K8309 Other cholangitis: Secondary | ICD-10-CM | POA: Diagnosis not present

## 2021-12-11 DIAGNOSIS — R0602 Shortness of breath: Secondary | ICD-10-CM | POA: Diagnosis not present

## 2021-12-11 DIAGNOSIS — R2681 Unsteadiness on feet: Secondary | ICD-10-CM | POA: Diagnosis not present

## 2021-12-11 DIAGNOSIS — M6281 Muscle weakness (generalized): Secondary | ICD-10-CM | POA: Diagnosis not present

## 2021-12-14 DIAGNOSIS — R0602 Shortness of breath: Secondary | ICD-10-CM | POA: Diagnosis not present

## 2021-12-14 DIAGNOSIS — R2681 Unsteadiness on feet: Secondary | ICD-10-CM | POA: Diagnosis not present

## 2021-12-14 DIAGNOSIS — K8309 Other cholangitis: Secondary | ICD-10-CM | POA: Diagnosis not present

## 2021-12-14 DIAGNOSIS — I129 Hypertensive chronic kidney disease with stage 1 through stage 4 chronic kidney disease, or unspecified chronic kidney disease: Secondary | ICD-10-CM | POA: Diagnosis not present

## 2021-12-14 DIAGNOSIS — R29898 Other symptoms and signs involving the musculoskeletal system: Secondary | ICD-10-CM | POA: Diagnosis not present

## 2021-12-14 DIAGNOSIS — M6281 Muscle weakness (generalized): Secondary | ICD-10-CM | POA: Diagnosis not present

## 2021-12-15 DIAGNOSIS — R2681 Unsteadiness on feet: Secondary | ICD-10-CM | POA: Diagnosis not present

## 2021-12-15 DIAGNOSIS — K8309 Other cholangitis: Secondary | ICD-10-CM | POA: Diagnosis not present

## 2021-12-15 DIAGNOSIS — R29898 Other symptoms and signs involving the musculoskeletal system: Secondary | ICD-10-CM | POA: Diagnosis not present

## 2021-12-15 DIAGNOSIS — I129 Hypertensive chronic kidney disease with stage 1 through stage 4 chronic kidney disease, or unspecified chronic kidney disease: Secondary | ICD-10-CM | POA: Diagnosis not present

## 2021-12-15 DIAGNOSIS — R0602 Shortness of breath: Secondary | ICD-10-CM | POA: Diagnosis not present

## 2021-12-15 DIAGNOSIS — M6281 Muscle weakness (generalized): Secondary | ICD-10-CM | POA: Diagnosis not present

## 2021-12-16 DIAGNOSIS — R2681 Unsteadiness on feet: Secondary | ICD-10-CM | POA: Diagnosis not present

## 2021-12-16 DIAGNOSIS — R29898 Other symptoms and signs involving the musculoskeletal system: Secondary | ICD-10-CM | POA: Diagnosis not present

## 2021-12-16 DIAGNOSIS — K8309 Other cholangitis: Secondary | ICD-10-CM | POA: Diagnosis not present

## 2021-12-16 DIAGNOSIS — I129 Hypertensive chronic kidney disease with stage 1 through stage 4 chronic kidney disease, or unspecified chronic kidney disease: Secondary | ICD-10-CM | POA: Diagnosis not present

## 2021-12-16 DIAGNOSIS — M6281 Muscle weakness (generalized): Secondary | ICD-10-CM | POA: Diagnosis not present

## 2021-12-16 DIAGNOSIS — R0602 Shortness of breath: Secondary | ICD-10-CM | POA: Diagnosis not present

## 2021-12-17 ENCOUNTER — Non-Acute Institutional Stay: Payer: Medicare Other | Admitting: Adult Health

## 2021-12-17 ENCOUNTER — Encounter: Payer: Self-pay | Admitting: Adult Health

## 2021-12-17 DIAGNOSIS — H6123 Impacted cerumen, bilateral: Secondary | ICD-10-CM | POA: Diagnosis not present

## 2021-12-17 NOTE — Progress Notes (Signed)
Location:  [place] Room Number: VO536/U Place of Service:  ALF [phone]) Provider:  Durenda Age, DNP, FNP-BC  Patient Care Team: [name], MD as PCP - General (Internal Medicine) [name], MD as Consulting Physician (Cardiology)  Extended Emergency Contact Information Primary Emergency Contact: Osborne Casco Address: [address]          [place], [place] [address] [place] Phone: [phone] Mobile Phone: [phone] Relation: Niece  Code Status:  DNR  Goals of care: Advanced Directive information    12/17/2021    9:43 AM  Advanced Directives  Does Patient Have a Medical Advance Directive? Yes  Type of Paramedic of [place];Living will;Out of facility DNR (pink MOST or yellow form)  Does patient want to make changes to medical advance directive? No - Patient declined  Copy of [name] in Chart? Yes - validated most recent copy scanned in chart (See row information)     Chief Complaint  Patient presents with   Acute Visit    Feels like she has a wax build up.     HPI:  Pt is a 88 y.o. female seen today for an acute visit regarding feeling of wax build up. She is a resident of [facility]. She was seen today and complained of feeling of fullness on both ears. She was noted to be hard of hearing. No drainage on both ears noted. Noted bilateral ears with moderate amount of dry earwax. She denies pain on both ears.   Past Medical History:  Diagnosis Date   Arthritis    "right thumb" (01/29/2015)   Chronic shoulder pain    "between my shoulders" (01/29/2015)   Dizziness    Esophageal stricture 2009   Gallstones    GERD (gastroesophageal reflux disease) 2004   Hemorrhoids 2004   Hiatal hernia 2004   History of blood transfusion 1947   "appendix ruptured"   HOH (hard of hearing)    Hypertension    Rhinitis, allergic    Syncopal  episodes    Past Surgical History:  Procedure Laterality Date   APPENDECTOMY  1947   "ruptured"   CARDIAC CATHETERIZATION  10/23/1999; 09/2014   EF 60% No significant CAD; patent coronary arteries.   CARPAL TUNNEL RELEASE Right 1990's   CHOLECYSTECTOMY N/A 01/29/2015   Procedure: LAPAROSCOPIC CHOLECYSTECTOMY;  Surgeon: Rolm Bookbinder, MD;  Location: [place];  Service: General;  Laterality: N/A;   DILATION AND CURETTAGE OF UTERUS  "couple"   ERCP N/A 09/13/2021   Procedure: ENDOSCOPIC RETROGRADE CHOLANGIOPANCREATOGRAPHY (ERCP);  Surgeon: Carol Ada, MD;  Location: [facility];  Service: Gastroenterology;  Laterality: N/A;   ESOPHAGOGASTRODUODENOSCOPY (EGD) WITH ESOPHAGEAL DILATION  2-3 times   HEMORROIDECTOMY     KNEE ARTHROSCOPY Bilateral    right x 2   LAPAROSCOPIC CHOLECYSTECTOMY  01/29/2015   LEFT HEART CATHETERIZATION WITH CORONARY ANGIOGRAM N/A 09/16/2014   Procedure: LEFT HEART CATHETERIZATION WITH CORONARY ANGIOGRAM;  Surgeon: [name], MD;  Location: [facility] CATH LAB;  Service: Cardiovascular;  Laterality: N/A;   NM MYOVIEW LTD  06/01/2012   EF 71%   REMOVAL OF STONES  09/13/2021   Procedure: REMOVAL OF STONES;  Surgeon: Carol Ada, MD;  Location: [facility];  Service: Gastroenterology;;   [name]  09/13/2021   Procedure: [name];  Surgeon: Carol Ada, MD;  Location: [facility];  Service: Gastroenterology;;   VAGINAL HYSTERECTOMY  1970's    Allergies  Allergen Reactions   Contrast Media [Iodinated Contrast Media] Itching and Rash    Pt covered in rash, red.   Codeine Nausea And Vomiting    in large doses: can tolerate Tussionex    Outpatient Encounter Medications as of 12/17/2021  Medication Sig   acetaminophen (TYLENOL) 650 MG suppository Place 650 mg rectally every 4 (four) hours as needed for fever.   alum & mag hydroxide-simeth (MAALOX PLUS) 400-400-40 MG/5ML suspension Take 10 mLs by mouth every 6 (six) hours as needed for indigestion or  heartburn.   amLODipine (NORVASC) 2.5 MG tablet Take 1 tablet (2.5 mg total) by mouth daily.   benzonatate (TESSALON) 100 MG capsule Take 1 capsule by mouth 3 (three) times daily.   mirtazapine (REMERON) 7.5 MG tablet Take 7.5 mg by mouth at bedtime.   Nutritional Supplements (RESOURCE 2.0) LIQD Take by mouth. 180 ml; oral Twice A Day Special Instructions: administer with med pass   omeprazole (PRILOSEC) 40 MG capsule Take 40 mg by mouth daily.   ondansetron (ZOFRAN-ODT) 4 MG disintegrating tablet Take 4 mg by mouth every 6 (six) hours as needed for nausea or vomiting.   vitamin B-12 (CYANOCOBALAMIN) 1000 MCG tablet Take 1,000 mcg by mouth daily.   No facility-administered encounter medications on file as of 12/17/2021.    Review of Systems  Constitutional:  Negative for appetite change, chills, fatigue and fever.  HENT:  Positive for hearing loss. Negative for congestion, ear discharge, ear pain, rhinorrhea and sore throat.   Eyes: Negative.   Respiratory:  Negative for cough, shortness of breath and wheezing.   Cardiovascular:  Positive for leg swelling. Negative for chest pain and palpitations.  Gastrointestinal:  Negative for abdominal pain, constipation, diarrhea, nausea and vomiting.  Genitourinary:  Negative for dysuria.  Musculoskeletal:  Negative for arthralgias, back pain and myalgias.  Skin:  Negative for color change, rash and wound.  Neurological:  Negative for dizziness, weakness and headaches.  Psychiatric/Behavioral:  Negative for behavioral problems. The patient is not nervous/anxious.        Immunization History  Administered Date(s) Administered   Influenza Split 03/16/2011, 03/01/2012   Influenza Whole 03/28/2007, 04/01/2009, 02/18/2010   Influenza, High Dose Seasonal PF 03/14/2013   Influenza,inj,Quad PF,6+ Mos 03/20/2014, 03/11/2015   Influenza-Unspecified 08/06/2018, 03/20/2020   Moderna SARS-COV2 Booster Vaccination 11/04/2020   Moderna Sars-Covid-2  Vaccination 06/11/2019, 07/09/2019, 02/24/2021   Pneumococcal Conjugate-13 02/25/2015   Pneumococcal Polysaccharide-23 03/28/2007   Td 12/20/2005   Pertinent  Health Maintenance Due  Topic Date Due   MAMMOGRAM  09/15/2013   INFLUENZA VACCINE  01/05/2022   DEXA SCAN  Completed      09/14/2021   12:00 PM 09/14/2021    8:15 PM 09/15/2021    3:00 PM 09/15/2021    8:00 PM 09/16/2021   10:06 AM  Fall Risk  Patient Fall Risk Level High fall risk High fall risk High fall risk High fall risk High fall risk     Vitals:   12/17/21 0940  BP: 134/82  Pulse: 73  Resp: 16  Temp: 98.1 F (36.7 C)  SpO2: 97%  Weight: 170 lb 3.2 oz (77.2 kg)  Height: 5' (1.524 m)   Body mass index is 33.24 kg/m.  Physical Exam Constitutional:      General: She is not in acute distress.    Appearance: Normal appearance. She is obese.  HENT:     Head: Normocephalic and atraumatic.     Right Ear: There is impacted cerumen.  Left Ear: There is impacted cerumen.     Nose: Nose normal.     Mouth/Throat:     Mouth: Mucous membranes are moist.  Eyes:     Conjunctiva/sclera: Conjunctivae normal.  Cardiovascular:     Rate and Rhythm: Normal rate and regular rhythm.  Pulmonary:     Effort: Pulmonary effort is normal.     Breath sounds: Normal breath sounds.  Abdominal:     General: Bowel sounds are normal.     Palpations: Abdomen is soft.  Musculoskeletal:        General: Swelling present. Normal range of motion.     Cervical back: Normal range of motion.     Comments: BLE 1-2+edema.  Skin:    General: Skin is warm and dry.  Neurological:     General: No focal deficit present.     Mental Status: She is alert. Mental status is at baseline.  Psychiatric:        Mood and Affect: Mood normal.        Behavior: Behavior normal.       Labs reviewed: Recent Labs    09/12/21 2108 09/13/21 0443 09/14/21 0908 09/15/21 0529 09/16/21 0500 09/18/21 0000 09/22/21 0000 10/05/21 0000  11/10/21 0000  NA 138 139 137 135 142   < > 141 140 141  K 3.2* 4.2 4.3 3.9 3.5   < > 3.9 4.6 4.1  CL 101 103 110 112* 117*   < > 106 107 107  CO2 23 25 17* 17* 19*   < > 22 21 27*  GLUCOSE 106* 108* 161* 108* 81  --   --   --   --   BUN 24* 24* 33* 30* 28*   < > 16 23* 20  CREATININE 2.20* 2.10* 2.54* 2.21* 1.89*   < > 1.6* 2.2* 1.4*  CALCIUM 9.2 8.6* 7.6* 7.6* 8.3*   < > 8.9 9.6 8.4*  MG 2.5* 2.3  --   --   --   --   --   --   --   PHOS  --   --   --   --  3.0  --   --   --   --    < > = values in this interval not displayed.   Recent Labs    09/13/21 0443 09/14/21 0908 09/15/21 0529 09/16/21 0500 09/18/21 0000 11/10/21 0000  AST 157* 69* 41  --  24 17  ALT 78* 58* 41  --  20 9  ALKPHOS 838* 672* 551*  --  452* 69  BILITOT 5.1* 1.5* 1.1  --   --   --   PROT 5.6* 5.5* 5.3*  --   --   --   ALBUMIN 2.7* 2.6* 2.5* 2.5* 2.8* 2.8*   Recent Labs    09/14/21 0908 09/15/21 0529 09/16/21 0500 09/18/21 0000  WBC 15.1* 16.6* 9.8 3.6  NEUTROABS 13.7* 13.9* 5.9 4,398.00  HGB 13.0 12.1 11.5* 12.1  HCT 40.8 36.2 35.6* 36  MCV 103.0* 99.5 101.4*  --   PLT 225 208 205 209   Lab Results  Component Value Date   TSH 1.313 09/13/2021   Lab Results  Component Value Date   HGBA1C 5.8 07/11/2017   Lab Results  Component Value Date   CHOL 136 06/18/2021   HDL 34 (A) 06/18/2021   LDLCALC 77 06/18/2021   LDLDIRECT 161.2 07/18/2013   TRIG 151 06/18/2021   CHOLHDL 2.9 04/03/2020  Significant Diagnostic Results in last 30 days:  No results found.  Assessment/Plan  1. Impacted cerumen of both ears -   Debrox Otic gtts instill 6 gtts to bilateral ears BID X 4 days then irrigate with water on 5th day    Family/ staff Communication: Discussed plan of care with resident and charge nurse.  Labs/tests ordered:   None    Durenda Age, DNP, MSN, FNP-BC [facility] [phone] (Monday-Friday 8:00 a.m. - 5:00 p.m.) [phone] (after  hours)

## 2021-12-18 DIAGNOSIS — I129 Hypertensive chronic kidney disease with stage 1 through stage 4 chronic kidney disease, or unspecified chronic kidney disease: Secondary | ICD-10-CM | POA: Diagnosis not present

## 2021-12-18 DIAGNOSIS — R0602 Shortness of breath: Secondary | ICD-10-CM | POA: Diagnosis not present

## 2021-12-18 DIAGNOSIS — R29898 Other symptoms and signs involving the musculoskeletal system: Secondary | ICD-10-CM | POA: Diagnosis not present

## 2021-12-18 DIAGNOSIS — R2681 Unsteadiness on feet: Secondary | ICD-10-CM | POA: Diagnosis not present

## 2021-12-18 DIAGNOSIS — K8309 Other cholangitis: Secondary | ICD-10-CM | POA: Diagnosis not present

## 2021-12-18 DIAGNOSIS — M6281 Muscle weakness (generalized): Secondary | ICD-10-CM | POA: Diagnosis not present

## 2021-12-21 DIAGNOSIS — M6281 Muscle weakness (generalized): Secondary | ICD-10-CM | POA: Diagnosis not present

## 2021-12-21 DIAGNOSIS — R2681 Unsteadiness on feet: Secondary | ICD-10-CM | POA: Diagnosis not present

## 2021-12-21 DIAGNOSIS — I129 Hypertensive chronic kidney disease with stage 1 through stage 4 chronic kidney disease, or unspecified chronic kidney disease: Secondary | ICD-10-CM | POA: Diagnosis not present

## 2021-12-21 DIAGNOSIS — R29898 Other symptoms and signs involving the musculoskeletal system: Secondary | ICD-10-CM | POA: Diagnosis not present

## 2021-12-21 DIAGNOSIS — R0602 Shortness of breath: Secondary | ICD-10-CM | POA: Diagnosis not present

## 2021-12-21 DIAGNOSIS — K8309 Other cholangitis: Secondary | ICD-10-CM | POA: Diagnosis not present

## 2021-12-22 DIAGNOSIS — R2681 Unsteadiness on feet: Secondary | ICD-10-CM | POA: Diagnosis not present

## 2021-12-22 DIAGNOSIS — M6281 Muscle weakness (generalized): Secondary | ICD-10-CM | POA: Diagnosis not present

## 2021-12-22 DIAGNOSIS — I129 Hypertensive chronic kidney disease with stage 1 through stage 4 chronic kidney disease, or unspecified chronic kidney disease: Secondary | ICD-10-CM | POA: Diagnosis not present

## 2021-12-22 DIAGNOSIS — R29898 Other symptoms and signs involving the musculoskeletal system: Secondary | ICD-10-CM | POA: Diagnosis not present

## 2021-12-22 DIAGNOSIS — K8309 Other cholangitis: Secondary | ICD-10-CM | POA: Diagnosis not present

## 2021-12-22 DIAGNOSIS — R0602 Shortness of breath: Secondary | ICD-10-CM | POA: Diagnosis not present

## 2021-12-23 DIAGNOSIS — R2681 Unsteadiness on feet: Secondary | ICD-10-CM | POA: Diagnosis not present

## 2021-12-23 DIAGNOSIS — K8309 Other cholangitis: Secondary | ICD-10-CM | POA: Diagnosis not present

## 2021-12-23 DIAGNOSIS — R0602 Shortness of breath: Secondary | ICD-10-CM | POA: Diagnosis not present

## 2021-12-23 DIAGNOSIS — R29898 Other symptoms and signs involving the musculoskeletal system: Secondary | ICD-10-CM | POA: Diagnosis not present

## 2021-12-23 DIAGNOSIS — I129 Hypertensive chronic kidney disease with stage 1 through stage 4 chronic kidney disease, or unspecified chronic kidney disease: Secondary | ICD-10-CM | POA: Diagnosis not present

## 2021-12-23 DIAGNOSIS — M6281 Muscle weakness (generalized): Secondary | ICD-10-CM | POA: Diagnosis not present

## 2021-12-24 DIAGNOSIS — M6281 Muscle weakness (generalized): Secondary | ICD-10-CM | POA: Diagnosis not present

## 2021-12-24 DIAGNOSIS — R29898 Other symptoms and signs involving the musculoskeletal system: Secondary | ICD-10-CM | POA: Diagnosis not present

## 2021-12-24 DIAGNOSIS — R0602 Shortness of breath: Secondary | ICD-10-CM | POA: Diagnosis not present

## 2021-12-24 DIAGNOSIS — K8309 Other cholangitis: Secondary | ICD-10-CM | POA: Diagnosis not present

## 2021-12-24 DIAGNOSIS — R2681 Unsteadiness on feet: Secondary | ICD-10-CM | POA: Diagnosis not present

## 2021-12-24 DIAGNOSIS — I129 Hypertensive chronic kidney disease with stage 1 through stage 4 chronic kidney disease, or unspecified chronic kidney disease: Secondary | ICD-10-CM | POA: Diagnosis not present

## 2021-12-25 DIAGNOSIS — I129 Hypertensive chronic kidney disease with stage 1 through stage 4 chronic kidney disease, or unspecified chronic kidney disease: Secondary | ICD-10-CM | POA: Diagnosis not present

## 2021-12-25 DIAGNOSIS — M6281 Muscle weakness (generalized): Secondary | ICD-10-CM | POA: Diagnosis not present

## 2021-12-25 DIAGNOSIS — R0602 Shortness of breath: Secondary | ICD-10-CM | POA: Diagnosis not present

## 2021-12-25 DIAGNOSIS — R29898 Other symptoms and signs involving the musculoskeletal system: Secondary | ICD-10-CM | POA: Diagnosis not present

## 2021-12-25 DIAGNOSIS — K8309 Other cholangitis: Secondary | ICD-10-CM | POA: Diagnosis not present

## 2021-12-25 DIAGNOSIS — R2681 Unsteadiness on feet: Secondary | ICD-10-CM | POA: Diagnosis not present

## 2021-12-28 DIAGNOSIS — I129 Hypertensive chronic kidney disease with stage 1 through stage 4 chronic kidney disease, or unspecified chronic kidney disease: Secondary | ICD-10-CM | POA: Diagnosis not present

## 2021-12-28 DIAGNOSIS — R0602 Shortness of breath: Secondary | ICD-10-CM | POA: Diagnosis not present

## 2021-12-28 DIAGNOSIS — M6281 Muscle weakness (generalized): Secondary | ICD-10-CM | POA: Diagnosis not present

## 2021-12-28 DIAGNOSIS — R29898 Other symptoms and signs involving the musculoskeletal system: Secondary | ICD-10-CM | POA: Diagnosis not present

## 2021-12-28 DIAGNOSIS — K8309 Other cholangitis: Secondary | ICD-10-CM | POA: Diagnosis not present

## 2021-12-28 DIAGNOSIS — R2681 Unsteadiness on feet: Secondary | ICD-10-CM | POA: Diagnosis not present

## 2021-12-30 DIAGNOSIS — K8309 Other cholangitis: Secondary | ICD-10-CM | POA: Diagnosis not present

## 2021-12-30 DIAGNOSIS — R2681 Unsteadiness on feet: Secondary | ICD-10-CM | POA: Diagnosis not present

## 2021-12-30 DIAGNOSIS — I129 Hypertensive chronic kidney disease with stage 1 through stage 4 chronic kidney disease, or unspecified chronic kidney disease: Secondary | ICD-10-CM | POA: Diagnosis not present

## 2021-12-30 DIAGNOSIS — R0602 Shortness of breath: Secondary | ICD-10-CM | POA: Diagnosis not present

## 2021-12-30 DIAGNOSIS — R29898 Other symptoms and signs involving the musculoskeletal system: Secondary | ICD-10-CM | POA: Diagnosis not present

## 2021-12-30 DIAGNOSIS — M6281 Muscle weakness (generalized): Secondary | ICD-10-CM | POA: Diagnosis not present

## 2022-01-01 DIAGNOSIS — M6281 Muscle weakness (generalized): Secondary | ICD-10-CM | POA: Diagnosis not present

## 2022-01-01 DIAGNOSIS — R2681 Unsteadiness on feet: Secondary | ICD-10-CM | POA: Diagnosis not present

## 2022-01-01 DIAGNOSIS — R0602 Shortness of breath: Secondary | ICD-10-CM | POA: Diagnosis not present

## 2022-01-01 DIAGNOSIS — R29898 Other symptoms and signs involving the musculoskeletal system: Secondary | ICD-10-CM | POA: Diagnosis not present

## 2022-01-01 DIAGNOSIS — K8309 Other cholangitis: Secondary | ICD-10-CM | POA: Diagnosis not present

## 2022-01-01 DIAGNOSIS — I129 Hypertensive chronic kidney disease with stage 1 through stage 4 chronic kidney disease, or unspecified chronic kidney disease: Secondary | ICD-10-CM | POA: Diagnosis not present

## 2022-01-04 DIAGNOSIS — M6281 Muscle weakness (generalized): Secondary | ICD-10-CM | POA: Diagnosis not present

## 2022-01-04 DIAGNOSIS — R0602 Shortness of breath: Secondary | ICD-10-CM | POA: Diagnosis not present

## 2022-01-04 DIAGNOSIS — R29898 Other symptoms and signs involving the musculoskeletal system: Secondary | ICD-10-CM | POA: Diagnosis not present

## 2022-01-04 DIAGNOSIS — R2681 Unsteadiness on feet: Secondary | ICD-10-CM | POA: Diagnosis not present

## 2022-01-04 DIAGNOSIS — K8309 Other cholangitis: Secondary | ICD-10-CM | POA: Diagnosis not present

## 2022-01-04 DIAGNOSIS — I129 Hypertensive chronic kidney disease with stage 1 through stage 4 chronic kidney disease, or unspecified chronic kidney disease: Secondary | ICD-10-CM | POA: Diagnosis not present

## 2022-01-05 DIAGNOSIS — K219 Gastro-esophageal reflux disease without esophagitis: Secondary | ICD-10-CM | POA: Diagnosis not present

## 2022-01-05 DIAGNOSIS — R0602 Shortness of breath: Secondary | ICD-10-CM | POA: Diagnosis not present

## 2022-01-05 DIAGNOSIS — K8309 Other cholangitis: Secondary | ICD-10-CM | POA: Diagnosis not present

## 2022-01-05 DIAGNOSIS — G3184 Mild cognitive impairment, so stated: Secondary | ICD-10-CM | POA: Diagnosis not present

## 2022-01-05 DIAGNOSIS — R1312 Dysphagia, oropharyngeal phase: Secondary | ICD-10-CM | POA: Diagnosis not present

## 2022-01-05 DIAGNOSIS — M6281 Muscle weakness (generalized): Secondary | ICD-10-CM | POA: Diagnosis not present

## 2022-01-05 DIAGNOSIS — R1310 Dysphagia, unspecified: Secondary | ICD-10-CM | POA: Diagnosis not present

## 2022-01-05 DIAGNOSIS — R531 Weakness: Secondary | ICD-10-CM | POA: Diagnosis not present

## 2022-01-05 DIAGNOSIS — I129 Hypertensive chronic kidney disease with stage 1 through stage 4 chronic kidney disease, or unspecified chronic kidney disease: Secondary | ICD-10-CM | POA: Diagnosis not present

## 2022-01-05 DIAGNOSIS — R2681 Unsteadiness on feet: Secondary | ICD-10-CM | POA: Diagnosis not present

## 2022-01-05 DIAGNOSIS — R29898 Other symptoms and signs involving the musculoskeletal system: Secondary | ICD-10-CM | POA: Diagnosis not present

## 2022-01-06 DIAGNOSIS — R2681 Unsteadiness on feet: Secondary | ICD-10-CM | POA: Diagnosis not present

## 2022-01-06 DIAGNOSIS — G3184 Mild cognitive impairment, so stated: Secondary | ICD-10-CM | POA: Diagnosis not present

## 2022-01-06 DIAGNOSIS — R1310 Dysphagia, unspecified: Secondary | ICD-10-CM | POA: Diagnosis not present

## 2022-01-06 DIAGNOSIS — R1312 Dysphagia, oropharyngeal phase: Secondary | ICD-10-CM | POA: Diagnosis not present

## 2022-01-06 DIAGNOSIS — M6281 Muscle weakness (generalized): Secondary | ICD-10-CM | POA: Diagnosis not present

## 2022-01-06 DIAGNOSIS — K219 Gastro-esophageal reflux disease without esophagitis: Secondary | ICD-10-CM | POA: Diagnosis not present

## 2022-01-07 DIAGNOSIS — G3184 Mild cognitive impairment, so stated: Secondary | ICD-10-CM | POA: Diagnosis not present

## 2022-01-07 DIAGNOSIS — R1312 Dysphagia, oropharyngeal phase: Secondary | ICD-10-CM | POA: Diagnosis not present

## 2022-01-07 DIAGNOSIS — M6281 Muscle weakness (generalized): Secondary | ICD-10-CM | POA: Diagnosis not present

## 2022-01-07 DIAGNOSIS — R2681 Unsteadiness on feet: Secondary | ICD-10-CM | POA: Diagnosis not present

## 2022-01-07 DIAGNOSIS — R1310 Dysphagia, unspecified: Secondary | ICD-10-CM | POA: Diagnosis not present

## 2022-01-07 DIAGNOSIS — K219 Gastro-esophageal reflux disease without esophagitis: Secondary | ICD-10-CM | POA: Diagnosis not present

## 2022-01-08 DIAGNOSIS — R1310 Dysphagia, unspecified: Secondary | ICD-10-CM | POA: Diagnosis not present

## 2022-01-08 DIAGNOSIS — R2681 Unsteadiness on feet: Secondary | ICD-10-CM | POA: Diagnosis not present

## 2022-01-08 DIAGNOSIS — M6281 Muscle weakness (generalized): Secondary | ICD-10-CM | POA: Diagnosis not present

## 2022-01-08 DIAGNOSIS — G3184 Mild cognitive impairment, so stated: Secondary | ICD-10-CM | POA: Diagnosis not present

## 2022-01-08 DIAGNOSIS — K219 Gastro-esophageal reflux disease without esophagitis: Secondary | ICD-10-CM | POA: Diagnosis not present

## 2022-01-08 DIAGNOSIS — R1312 Dysphagia, oropharyngeal phase: Secondary | ICD-10-CM | POA: Diagnosis not present

## 2022-01-11 DIAGNOSIS — G3184 Mild cognitive impairment, so stated: Secondary | ICD-10-CM | POA: Diagnosis not present

## 2022-01-11 DIAGNOSIS — R1312 Dysphagia, oropharyngeal phase: Secondary | ICD-10-CM | POA: Diagnosis not present

## 2022-01-11 DIAGNOSIS — R2681 Unsteadiness on feet: Secondary | ICD-10-CM | POA: Diagnosis not present

## 2022-01-11 DIAGNOSIS — M6281 Muscle weakness (generalized): Secondary | ICD-10-CM | POA: Diagnosis not present

## 2022-01-11 DIAGNOSIS — R1310 Dysphagia, unspecified: Secondary | ICD-10-CM | POA: Diagnosis not present

## 2022-01-11 DIAGNOSIS — K219 Gastro-esophageal reflux disease without esophagitis: Secondary | ICD-10-CM | POA: Diagnosis not present

## 2022-01-12 DIAGNOSIS — R1310 Dysphagia, unspecified: Secondary | ICD-10-CM | POA: Diagnosis not present

## 2022-01-12 DIAGNOSIS — R2681 Unsteadiness on feet: Secondary | ICD-10-CM | POA: Diagnosis not present

## 2022-01-12 DIAGNOSIS — R1312 Dysphagia, oropharyngeal phase: Secondary | ICD-10-CM | POA: Diagnosis not present

## 2022-01-12 DIAGNOSIS — G3184 Mild cognitive impairment, so stated: Secondary | ICD-10-CM | POA: Diagnosis not present

## 2022-01-12 DIAGNOSIS — K219 Gastro-esophageal reflux disease without esophagitis: Secondary | ICD-10-CM | POA: Diagnosis not present

## 2022-01-12 DIAGNOSIS — M6281 Muscle weakness (generalized): Secondary | ICD-10-CM | POA: Diagnosis not present

## 2022-01-13 DIAGNOSIS — R2681 Unsteadiness on feet: Secondary | ICD-10-CM | POA: Diagnosis not present

## 2022-01-13 DIAGNOSIS — M6281 Muscle weakness (generalized): Secondary | ICD-10-CM | POA: Diagnosis not present

## 2022-01-13 DIAGNOSIS — R1310 Dysphagia, unspecified: Secondary | ICD-10-CM | POA: Diagnosis not present

## 2022-01-13 DIAGNOSIS — K219 Gastro-esophageal reflux disease without esophagitis: Secondary | ICD-10-CM | POA: Diagnosis not present

## 2022-01-13 DIAGNOSIS — R1312 Dysphagia, oropharyngeal phase: Secondary | ICD-10-CM | POA: Diagnosis not present

## 2022-01-13 DIAGNOSIS — G3184 Mild cognitive impairment, so stated: Secondary | ICD-10-CM | POA: Diagnosis not present

## 2022-01-14 DIAGNOSIS — G3184 Mild cognitive impairment, so stated: Secondary | ICD-10-CM | POA: Diagnosis not present

## 2022-01-14 DIAGNOSIS — R1312 Dysphagia, oropharyngeal phase: Secondary | ICD-10-CM | POA: Diagnosis not present

## 2022-01-14 DIAGNOSIS — K219 Gastro-esophageal reflux disease without esophagitis: Secondary | ICD-10-CM | POA: Diagnosis not present

## 2022-01-14 DIAGNOSIS — R2681 Unsteadiness on feet: Secondary | ICD-10-CM | POA: Diagnosis not present

## 2022-01-14 DIAGNOSIS — R1310 Dysphagia, unspecified: Secondary | ICD-10-CM | POA: Diagnosis not present

## 2022-01-14 DIAGNOSIS — M6281 Muscle weakness (generalized): Secondary | ICD-10-CM | POA: Diagnosis not present

## 2022-01-15 DIAGNOSIS — R1310 Dysphagia, unspecified: Secondary | ICD-10-CM | POA: Diagnosis not present

## 2022-01-15 DIAGNOSIS — R2681 Unsteadiness on feet: Secondary | ICD-10-CM | POA: Diagnosis not present

## 2022-01-15 DIAGNOSIS — K219 Gastro-esophageal reflux disease without esophagitis: Secondary | ICD-10-CM | POA: Diagnosis not present

## 2022-01-15 DIAGNOSIS — M6281 Muscle weakness (generalized): Secondary | ICD-10-CM | POA: Diagnosis not present

## 2022-01-15 DIAGNOSIS — G3184 Mild cognitive impairment, so stated: Secondary | ICD-10-CM | POA: Diagnosis not present

## 2022-01-15 DIAGNOSIS — R1312 Dysphagia, oropharyngeal phase: Secondary | ICD-10-CM | POA: Diagnosis not present

## 2022-01-18 DIAGNOSIS — M6281 Muscle weakness (generalized): Secondary | ICD-10-CM | POA: Diagnosis not present

## 2022-01-18 DIAGNOSIS — R2681 Unsteadiness on feet: Secondary | ICD-10-CM | POA: Diagnosis not present

## 2022-01-18 DIAGNOSIS — R1312 Dysphagia, oropharyngeal phase: Secondary | ICD-10-CM | POA: Diagnosis not present

## 2022-01-18 DIAGNOSIS — G3184 Mild cognitive impairment, so stated: Secondary | ICD-10-CM | POA: Diagnosis not present

## 2022-01-18 DIAGNOSIS — K219 Gastro-esophageal reflux disease without esophagitis: Secondary | ICD-10-CM | POA: Diagnosis not present

## 2022-01-18 DIAGNOSIS — R1310 Dysphagia, unspecified: Secondary | ICD-10-CM | POA: Diagnosis not present

## 2022-01-19 ENCOUNTER — Encounter: Payer: Self-pay | Admitting: Nurse Practitioner

## 2022-01-19 ENCOUNTER — Non-Acute Institutional Stay: Payer: Medicare Other | Admitting: Nurse Practitioner

## 2022-01-19 DIAGNOSIS — K8050 Calculus of bile duct without cholangitis or cholecystitis without obstruction: Secondary | ICD-10-CM

## 2022-01-19 DIAGNOSIS — K219 Gastro-esophageal reflux disease without esophagitis: Secondary | ICD-10-CM | POA: Diagnosis not present

## 2022-01-19 DIAGNOSIS — E785 Hyperlipidemia, unspecified: Secondary | ICD-10-CM | POA: Diagnosis not present

## 2022-01-19 DIAGNOSIS — G3184 Mild cognitive impairment, so stated: Secondary | ICD-10-CM | POA: Diagnosis not present

## 2022-01-19 DIAGNOSIS — R627 Adult failure to thrive: Secondary | ICD-10-CM | POA: Diagnosis not present

## 2022-01-19 DIAGNOSIS — R1312 Dysphagia, oropharyngeal phase: Secondary | ICD-10-CM | POA: Diagnosis not present

## 2022-01-19 DIAGNOSIS — R053 Chronic cough: Secondary | ICD-10-CM | POA: Diagnosis not present

## 2022-01-19 DIAGNOSIS — I10 Essential (primary) hypertension: Secondary | ICD-10-CM | POA: Diagnosis not present

## 2022-01-19 DIAGNOSIS — I872 Venous insufficiency (chronic) (peripheral): Secondary | ICD-10-CM | POA: Diagnosis not present

## 2022-01-19 DIAGNOSIS — E876 Hypokalemia: Secondary | ICD-10-CM

## 2022-01-19 DIAGNOSIS — N1831 Chronic kidney disease, stage 3a: Secondary | ICD-10-CM

## 2022-01-19 DIAGNOSIS — M6281 Muscle weakness (generalized): Secondary | ICD-10-CM | POA: Diagnosis not present

## 2022-01-19 DIAGNOSIS — R2681 Unsteadiness on feet: Secondary | ICD-10-CM | POA: Diagnosis not present

## 2022-01-19 DIAGNOSIS — D519 Vitamin B12 deficiency anemia, unspecified: Secondary | ICD-10-CM | POA: Diagnosis not present

## 2022-01-19 DIAGNOSIS — R413 Other amnesia: Secondary | ICD-10-CM | POA: Diagnosis not present

## 2022-01-19 DIAGNOSIS — R1310 Dysphagia, unspecified: Secondary | ICD-10-CM | POA: Diagnosis not present

## 2022-01-19 NOTE — Progress Notes (Signed)
Location:  [place] Room Number: FF638/[id number] Place of Service:  ALF [phone]) Provider:  Sabrina Arriaga X, NP [name], MD  Patient Care Team: [name], MD as PCP - General (Internal Medicine) [name], MD as Consulting Physician (Cardiology)  Extended Emergency Contact Information Primary Emergency Contact: Osborne Casco Address: [address]          [place], [place] [address] [place] Phone: [phone] Mobile Phone: [phone] Relation: Niece  Code Status:  DNR Goals of care: Advanced Directive information    01/19/2022   11:57 AM  Advanced Directives  Does Patient Have a Medical Advance Directive? Yes  Type of Paramedic of [place];Living will;Out of facility DNR (pink MOST or yellow form)  Does patient want to make changes to medical advance directive? No - Patient declined  Copy of [place] in Chart? Yes - validated most recent copy scanned in chart (See row information)     Chief Complaint  Patient presents with   Medical Management of Chronic Issues    Patient is here for a follow up for chronic conditions, patient is due for mammogram and discuss update on vaccines    HPI:  Pt is a 88 y.o. female seen today for managing chronic medical conditions.   Weight loss, takes Mirtazapine, stabilized.   Hospitalized 09/12/21-09/16/21, choledocholithiasis/ascending cholangitis s/p ERCP and stone removal. Severe sepsis/E coli bacteremia- fully treated. AST/ALT 17/9 11/10/21             Chronic cough, on Benzonatate              CKD, stage 3, Bun/creat 20/1.4 11/10/21             GERD, better indigestion, burping, on Omeprazole. prn Maalox             Edema BLE, off Furosemide, not new,  EF 50-55% 06/01/2017.              Hypokalemia, on KCL, K 4.1 11/10/21             HTN, takes Amlodipine             Hyperlipidemia, LDL 77 06/18/21, off Rosuvastatin             Dementia,  gradual decline, 06/26/20 MMSE 23/30, failed clock drawing. TSH 1.313 09/13/21             Anemia, Vit B12 228 04/03/20, added Vit B 216/22, off Fe, Hgb 12.1 09/18/21    Past Medical History:  Diagnosis Date   Arthritis    "right thumb" (01/29/2015)   Chronic shoulder pain    "between my shoulders" (01/29/2015)   Dizziness    Esophageal stricture 2009   Gallstones    GERD (gastroesophageal reflux disease) 2004   Hemorrhoids 2004   Hiatal hernia 2004   History of blood transfusion 1947   "appendix ruptured"   HOH (hard of hearing)    Hypertension    Rhinitis, allergic    Syncopal episodes    Past Surgical History:  Procedure Laterality Date   APPENDECTOMY  1947   "ruptured"   CARDIAC CATHETERIZATION  10/23/1999; 09/2014   EF 60% No significant CAD; patent coronary arteries.   CARPAL TUNNEL RELEASE Right 1990's   CHOLECYSTECTOMY N/A 01/29/2015   Procedure: LAPAROSCOPIC CHOLECYSTECTOMY;  Surgeon: Rolm Bookbinder, MD;  Location: [place];  Service: General;  Laterality: N/A;   DILATION AND CURETTAGE OF  UTERUS  "couple"   ERCP N/A 09/13/2021   Procedure: ENDOSCOPIC RETROGRADE CHOLANGIOPANCREATOGRAPHY (ERCP);  Surgeon: Carol Ada, MD;  Location: [facility];  Service: Gastroenterology;  Laterality: N/A;   ESOPHAGOGASTRODUODENOSCOPY (EGD) WITH ESOPHAGEAL DILATION  2-3 times   HEMORROIDECTOMY     KNEE ARTHROSCOPY Bilateral    right x 2   LAPAROSCOPIC CHOLECYSTECTOMY  01/29/2015   LEFT HEART CATHETERIZATION WITH CORONARY ANGIOGRAM N/A 09/16/2014   Procedure: LEFT HEART CATHETERIZATION WITH CORONARY ANGIOGRAM;  Surgeon: [name], MD;  Location: [facility] CATH LAB;  Service: Cardiovascular;  Laterality: N/A;   NM MYOVIEW LTD  06/01/2012   EF 71%   REMOVAL OF STONES  09/13/2021   Procedure: REMOVAL OF STONES;  Surgeon: Carol Ada, MD;  Location: [facility];  Service: Gastroenterology;;   [name]  09/13/2021   Procedure: [name];  Surgeon: Carol Ada, MD;  Location: [facility];  Service: Gastroenterology;;   VAGINAL HYSTERECTOMY  1970's    Allergies  Allergen Reactions   Contrast Media [Iodinated Contrast Media] Itching and Rash    Pt covered in rash, red.   Codeine Nausea And Vomiting    in large doses: can tolerate Tussionex    Outpatient Encounter Medications as of 01/19/2022  Medication Sig   acetaminophen (TYLENOL) 650 MG suppository Place 650 mg rectally every 4 (four) hours as needed for fever.   alum & mag hydroxide-simeth (MAALOX PLUS) 400-400-40 MG/5ML suspension Take 10 mLs by mouth every 6 (six) hours as needed for indigestion or heartburn.   amLODipine (NORVASC) 2.5 MG tablet Take 1 tablet (2.5 mg total) by mouth daily.   benzonatate (TESSALON) 100 MG capsule Take 1 capsule by mouth 3 (three) times daily.   mirtazapine (REMERON) 7.5 MG tablet Take 7.5 mg by mouth at bedtime.   Nutritional Supplements (RESOURCE 2.0) LIQD Take by mouth. 180 ml; oral Twice A Day Special Instructions: administer with med pass   omeprazole (PRILOSEC) 40 MG capsule Take 40 mg by mouth daily.   ondansetron (ZOFRAN-ODT) 4 MG disintegrating tablet Take 4 mg by mouth every 6 (six) hours as needed for nausea or vomiting.   vitamin B-12 (CYANOCOBALAMIN) 1000 MCG tablet Take 1,000 mcg by mouth daily.   No facility-administered encounter medications on file as of 01/19/2022.    Review of Systems  Constitutional:  Positive for fatigue. Negative for appetite change, fever and unexpected weight change.  HENT:  Positive for hearing loss. Negative for congestion and trouble swallowing.   Respiratory:  Positive for cough. Negative for chest tightness and shortness of breath.   Cardiovascular:  Positive for leg swelling.  Gastrointestinal:  Negative for abdominal pain, constipation, nausea and vomiting.  Genitourinary:  Positive for frequency. Negative for dysuria and urgency.       Baseline urinary frequency  Musculoskeletal:  Positive for gait problem.  Skin:   Negative for color change.  Neurological:  Negative for speech difficulty, weakness and light-headedness.       Memory lapses.   Psychiatric/Behavioral:  Negative for behavioral problems and sleep disturbance. The patient is not nervous/anxious.     Immunization History  Administered Date(s) Administered   Influenza Split 03/16/2011, 03/01/2012   Influenza Whole 03/28/2007, 04/01/2009, 02/18/2010   Influenza, High Dose Seasonal PF 03/14/2013   Influenza,inj,Quad PF,6+ Mos 03/20/2014, 03/11/2015   Influenza-Unspecified 08/06/2018, 03/20/2020   Moderna SARS-COV2 Booster Vaccination 11/04/2020   Moderna Sars-Covid-2 Vaccination 06/11/2019, 07/09/2019, 02/24/2021   Pneumococcal Conjugate-13 02/25/2015   Pneumococcal Polysaccharide-23 03/28/2007   Td 12/20/2005   Pertinent  Health Maintenance Due  Topic Date Due   MAMMOGRAM  09/15/2013   INFLUENZA VACCINE  01/05/2022   DEXA SCAN  Completed      09/14/2021   12:00 PM 09/14/2021    8:15 PM 09/15/2021    3:00 PM 09/15/2021    8:00 PM 09/16/2021   10:06 AM  Fall Risk  Patient Fall Risk Level High fall risk High fall risk High fall risk High fall risk High fall risk   Functional Status Survey:    Vitals:   01/19/22 1153  BP: 118/64  Pulse: 64  Resp: 18  Temp: 97.6 F (36.4 C)  SpO2: 95%  Weight: 172 lb (78 kg)  Height: 5' (1.524 m)   Body mass index is 33.59 kg/m. Physical Exam Constitutional:      Appearance: Normal appearance.  HENT:     Head: Normocephalic and atraumatic.     Mouth/Throat:     Mouth: Mucous membranes are moist.  Eyes:     Extraocular Movements: Extraocular movements intact.     Conjunctiva/sclera: Conjunctivae normal.     Pupils: Pupils are equal, round, and reactive to light.  Cardiovascular:     Rate and Rhythm: Normal rate and regular rhythm.     Heart sounds: No murmur heard. Pulmonary:     Effort: Pulmonary effort is normal.     Breath sounds: No rales.  Abdominal:     General: Bowel  sounds are normal.     Palpations: Abdomen is soft.     Tenderness: There is no abdominal tenderness.  Musculoskeletal:     Cervical back: Normal range of motion and neck supple.     Right lower leg: Edema present.     Left lower leg: Edema present.     Comments: BLE chronic venous insufficiency skin changes. Trace edema BLE  Skin:    General: Skin is warm and dry.  Neurological:     General: No focal deficit present.     Mental Status: She is alert. Mental status is at baseline.     Gait: Gait abnormal.     Comments: Oriented to person, her room on unit.   Psychiatric:        Mood and Affect: Mood normal.        Behavior: Behavior normal.     Labs reviewed: Recent Labs    09/12/21 2108 09/13/21 0443 09/14/21 0908 09/15/21 0529 09/16/21 0500 09/18/21 0000 09/22/21 0000 10/05/21 0000 11/10/21 0000  NA 138 139 137 135 142   < > 141 140 141  K 3.2* 4.2 4.3 3.9 3.5   < > 3.9 4.6 4.1  CL 101 103 110 112* 117*   < > 106 107 107  CO2 23 25 17* 17* 19*   < > 22 21 27*  GLUCOSE 106* 108* 161* 108* 81  --   --   --   --   BUN 24* 24* 33* 30* 28*   < > 16 23* 20  CREATININE 2.20* 2.10* 2.54* 2.21* 1.89*   < > 1.6* 2.2* 1.4*  CALCIUM 9.2 8.6* 7.6* 7.6* 8.3*   < > 8.9 9.6 8.4*  MG 2.5* 2.3  --   --   --   --   --   --   --   PHOS  --   --   --   --  3.0  --   --   --   --    < > = values in this  interval not displayed.   Recent Labs    09/13/21 0443 09/14/21 0908 09/15/21 0529 09/16/21 0500 09/18/21 0000 11/10/21 0000  AST 157* 69* 41  --  24 17  ALT 78* 58* 41  --  20 9  ALKPHOS 838* 672* 551*  --  452* 69  BILITOT 5.1* 1.5* 1.1  --   --   --   PROT 5.6* 5.5* 5.3*  --   --   --   ALBUMIN 2.7* 2.6* 2.5* 2.5* 2.8* 2.8*   Recent Labs    09/14/21 0908 09/15/21 0529 09/16/21 0500 09/18/21 0000  WBC 15.1* 16.6* 9.8 3.6  NEUTROABS 13.7* 13.9* 5.9 4,398.00  HGB 13.0 12.1 11.5* 12.1  HCT 40.8 36.2 35.6* 36  MCV 103.0* 99.5 101.4*  --   PLT 225 208 205 209   Lab  Results  Component Value Date   TSH 1.313 09/13/2021   Lab Results  Component Value Date   HGBA1C 5.8 07/11/2017   Lab Results  Component Value Date   CHOL 136 06/18/2021   HDL 34 (A) 06/18/2021   LDLCALC 77 06/18/2021   LDLDIRECT 161.2 07/18/2013   TRIG 151 06/18/2021   CHOLHDL 2.9 04/03/2020    Significant Diagnostic Results in last 30 days:  No results found.  Assessment/Plan Hypertension Blood pressure is controlled,  takes Amlodipine  HLD (hyperlipidemia)  LDL 77 06/18/21, off Rosuvastatin  Memory loss gradual decline, 06/26/20 MMSE 23/30, failed clock drawing. TSH 1.313 09/13/21  Vitamin B12 deficiency anemia  Vit B12 228 04/03/20, added Vit B 216/22, off Fe, Hgb 12.1 09/18/21  Hypokalemia on KCL, K 4.1 11/10/21  Edema of both lower extremities due to peripheral venous insufficiency off Furosemide, not new,  EF 50-55% 06/01/2017.   GERD better indigestion, burping, on Omeprazole. prn Maalox  CKD (chronic kidney disease) stage 3, GFR 30-59 ml/min (HCC) Bun/creat 20/1.4 11/10/21  COUGH, CHRONIC on Benzonatate   Choledocholithiasis Hospitalized 09/12/21-09/16/21, choledocholithiasis/ascending cholangitis s/p ERCP and stone removal. Severe sepsis/E coli bacteremia- fully treated. AST/ALT 17/9 11/10/21  Adult failure to thrive takes Mirtazapine, stabilized.     Family/ staff Communication: plan of care reviewed with the patient and charge nurse.   Labs/tests ordered: none  Time spend 40 minutes.

## 2022-01-20 DIAGNOSIS — R2681 Unsteadiness on feet: Secondary | ICD-10-CM | POA: Diagnosis not present

## 2022-01-20 DIAGNOSIS — G3184 Mild cognitive impairment, so stated: Secondary | ICD-10-CM | POA: Diagnosis not present

## 2022-01-20 DIAGNOSIS — R1310 Dysphagia, unspecified: Secondary | ICD-10-CM | POA: Diagnosis not present

## 2022-01-20 DIAGNOSIS — M6281 Muscle weakness (generalized): Secondary | ICD-10-CM | POA: Diagnosis not present

## 2022-01-20 DIAGNOSIS — K219 Gastro-esophageal reflux disease without esophagitis: Secondary | ICD-10-CM | POA: Diagnosis not present

## 2022-01-20 DIAGNOSIS — R1312 Dysphagia, oropharyngeal phase: Secondary | ICD-10-CM | POA: Diagnosis not present

## 2022-01-21 DIAGNOSIS — G3184 Mild cognitive impairment, so stated: Secondary | ICD-10-CM | POA: Diagnosis not present

## 2022-01-21 DIAGNOSIS — R2681 Unsteadiness on feet: Secondary | ICD-10-CM | POA: Diagnosis not present

## 2022-01-21 DIAGNOSIS — R1312 Dysphagia, oropharyngeal phase: Secondary | ICD-10-CM | POA: Diagnosis not present

## 2022-01-21 DIAGNOSIS — M6281 Muscle weakness (generalized): Secondary | ICD-10-CM | POA: Diagnosis not present

## 2022-01-21 DIAGNOSIS — K219 Gastro-esophageal reflux disease without esophagitis: Secondary | ICD-10-CM | POA: Diagnosis not present

## 2022-01-21 DIAGNOSIS — R1310 Dysphagia, unspecified: Secondary | ICD-10-CM | POA: Diagnosis not present

## 2022-01-22 DIAGNOSIS — G3184 Mild cognitive impairment, so stated: Secondary | ICD-10-CM | POA: Diagnosis not present

## 2022-01-22 DIAGNOSIS — R1310 Dysphagia, unspecified: Secondary | ICD-10-CM | POA: Diagnosis not present

## 2022-01-22 DIAGNOSIS — R2681 Unsteadiness on feet: Secondary | ICD-10-CM | POA: Diagnosis not present

## 2022-01-22 DIAGNOSIS — K219 Gastro-esophageal reflux disease without esophagitis: Secondary | ICD-10-CM | POA: Diagnosis not present

## 2022-01-22 DIAGNOSIS — M6281 Muscle weakness (generalized): Secondary | ICD-10-CM | POA: Diagnosis not present

## 2022-01-22 DIAGNOSIS — R1312 Dysphagia, oropharyngeal phase: Secondary | ICD-10-CM | POA: Diagnosis not present

## 2022-01-23 DIAGNOSIS — R2681 Unsteadiness on feet: Secondary | ICD-10-CM | POA: Diagnosis not present

## 2022-01-23 DIAGNOSIS — R1312 Dysphagia, oropharyngeal phase: Secondary | ICD-10-CM | POA: Diagnosis not present

## 2022-01-23 DIAGNOSIS — G3184 Mild cognitive impairment, so stated: Secondary | ICD-10-CM | POA: Diagnosis not present

## 2022-01-23 DIAGNOSIS — R1310 Dysphagia, unspecified: Secondary | ICD-10-CM | POA: Diagnosis not present

## 2022-01-23 DIAGNOSIS — K219 Gastro-esophageal reflux disease without esophagitis: Secondary | ICD-10-CM | POA: Diagnosis not present

## 2022-01-23 DIAGNOSIS — M6281 Muscle weakness (generalized): Secondary | ICD-10-CM | POA: Diagnosis not present

## 2022-01-25 ENCOUNTER — Encounter: Payer: Self-pay | Admitting: Nurse Practitioner

## 2022-01-25 DIAGNOSIS — R1310 Dysphagia, unspecified: Secondary | ICD-10-CM | POA: Diagnosis not present

## 2022-01-25 DIAGNOSIS — R1312 Dysphagia, oropharyngeal phase: Secondary | ICD-10-CM | POA: Diagnosis not present

## 2022-01-25 DIAGNOSIS — K219 Gastro-esophageal reflux disease without esophagitis: Secondary | ICD-10-CM | POA: Diagnosis not present

## 2022-01-25 DIAGNOSIS — R627 Adult failure to thrive: Secondary | ICD-10-CM | POA: Insufficient documentation

## 2022-01-25 DIAGNOSIS — G3184 Mild cognitive impairment, so stated: Secondary | ICD-10-CM | POA: Diagnosis not present

## 2022-01-25 DIAGNOSIS — M6281 Muscle weakness (generalized): Secondary | ICD-10-CM | POA: Diagnosis not present

## 2022-01-25 DIAGNOSIS — R2681 Unsteadiness on feet: Secondary | ICD-10-CM | POA: Diagnosis not present

## 2022-01-25 NOTE — Assessment & Plan Note (Signed)
LDL 77 06/18/21, off Rosuvastatin 

## 2022-01-25 NOTE — Assessment & Plan Note (Signed)
on KCL, K 4.1 11/10/21 

## 2022-01-25 NOTE — Assessment & Plan Note (Signed)
on Benzonatate  

## 2022-01-25 NOTE — Assessment & Plan Note (Signed)
Blood pressure is controlled,  takes Amlodipine 

## 2022-01-25 NOTE — Assessment & Plan Note (Signed)
Vit B12 228 04/03/20, added Vit B 216/22, off Fe, Hgb 12.1 09/18/21 

## 2022-01-25 NOTE — Assessment & Plan Note (Signed)
off Furosemide, not new,  EF 50-55% 06/01/2017.  

## 2022-01-25 NOTE — Assessment & Plan Note (Signed)
takes Mirtazapine, stabilized.

## 2022-01-25 NOTE — Assessment & Plan Note (Signed)
better indigestion, burping, on Omeprazole. prn Maalox 

## 2022-01-25 NOTE — Assessment & Plan Note (Signed)
gradual decline, 06/26/20 MMSE 23/30, failed clock drawing. TSH 1.313 09/13/21 

## 2022-01-25 NOTE — Assessment & Plan Note (Signed)
Hospitalized 09/12/21-09/16/21, choledocholithiasis/ascending cholangitis s/p ERCP and stone removal. Severe sepsis/E coli bacteremia- fully treated. AST/ALT 17/9 11/10/21

## 2022-01-25 NOTE — Assessment & Plan Note (Signed)
Bun/creat 20/1.4 11/10/21 

## 2022-01-26 DIAGNOSIS — M6281 Muscle weakness (generalized): Secondary | ICD-10-CM | POA: Diagnosis not present

## 2022-01-26 DIAGNOSIS — G3184 Mild cognitive impairment, so stated: Secondary | ICD-10-CM | POA: Diagnosis not present

## 2022-01-26 DIAGNOSIS — R1312 Dysphagia, oropharyngeal phase: Secondary | ICD-10-CM | POA: Diagnosis not present

## 2022-01-26 DIAGNOSIS — R1310 Dysphagia, unspecified: Secondary | ICD-10-CM | POA: Diagnosis not present

## 2022-01-26 DIAGNOSIS — R2681 Unsteadiness on feet: Secondary | ICD-10-CM | POA: Diagnosis not present

## 2022-01-26 DIAGNOSIS — K219 Gastro-esophageal reflux disease without esophagitis: Secondary | ICD-10-CM | POA: Diagnosis not present

## 2022-01-28 DIAGNOSIS — M6281 Muscle weakness (generalized): Secondary | ICD-10-CM | POA: Diagnosis not present

## 2022-01-28 DIAGNOSIS — R1312 Dysphagia, oropharyngeal phase: Secondary | ICD-10-CM | POA: Diagnosis not present

## 2022-01-28 DIAGNOSIS — R1310 Dysphagia, unspecified: Secondary | ICD-10-CM | POA: Diagnosis not present

## 2022-01-28 DIAGNOSIS — K219 Gastro-esophageal reflux disease without esophagitis: Secondary | ICD-10-CM | POA: Diagnosis not present

## 2022-01-28 DIAGNOSIS — R2681 Unsteadiness on feet: Secondary | ICD-10-CM | POA: Diagnosis not present

## 2022-01-28 DIAGNOSIS — G3184 Mild cognitive impairment, so stated: Secondary | ICD-10-CM | POA: Diagnosis not present

## 2022-02-01 DIAGNOSIS — K219 Gastro-esophageal reflux disease without esophagitis: Secondary | ICD-10-CM | POA: Diagnosis not present

## 2022-02-01 DIAGNOSIS — R1312 Dysphagia, oropharyngeal phase: Secondary | ICD-10-CM | POA: Diagnosis not present

## 2022-02-01 DIAGNOSIS — M6281 Muscle weakness (generalized): Secondary | ICD-10-CM | POA: Diagnosis not present

## 2022-02-01 DIAGNOSIS — R2681 Unsteadiness on feet: Secondary | ICD-10-CM | POA: Diagnosis not present

## 2022-02-01 DIAGNOSIS — G3184 Mild cognitive impairment, so stated: Secondary | ICD-10-CM | POA: Diagnosis not present

## 2022-02-01 DIAGNOSIS — R1310 Dysphagia, unspecified: Secondary | ICD-10-CM | POA: Diagnosis not present

## 2022-02-02 DIAGNOSIS — R1312 Dysphagia, oropharyngeal phase: Secondary | ICD-10-CM | POA: Diagnosis not present

## 2022-02-02 DIAGNOSIS — G3184 Mild cognitive impairment, so stated: Secondary | ICD-10-CM | POA: Diagnosis not present

## 2022-02-02 DIAGNOSIS — K219 Gastro-esophageal reflux disease without esophagitis: Secondary | ICD-10-CM | POA: Diagnosis not present

## 2022-02-02 DIAGNOSIS — R2681 Unsteadiness on feet: Secondary | ICD-10-CM | POA: Diagnosis not present

## 2022-02-02 DIAGNOSIS — M6281 Muscle weakness (generalized): Secondary | ICD-10-CM | POA: Diagnosis not present

## 2022-02-02 DIAGNOSIS — R1310 Dysphagia, unspecified: Secondary | ICD-10-CM | POA: Diagnosis not present

## 2022-02-03 DIAGNOSIS — R1310 Dysphagia, unspecified: Secondary | ICD-10-CM | POA: Diagnosis not present

## 2022-02-03 DIAGNOSIS — K219 Gastro-esophageal reflux disease without esophagitis: Secondary | ICD-10-CM | POA: Diagnosis not present

## 2022-02-03 DIAGNOSIS — M6281 Muscle weakness (generalized): Secondary | ICD-10-CM | POA: Diagnosis not present

## 2022-02-03 DIAGNOSIS — R1312 Dysphagia, oropharyngeal phase: Secondary | ICD-10-CM | POA: Diagnosis not present

## 2022-02-03 DIAGNOSIS — G3184 Mild cognitive impairment, so stated: Secondary | ICD-10-CM | POA: Diagnosis not present

## 2022-02-03 DIAGNOSIS — R2681 Unsteadiness on feet: Secondary | ICD-10-CM | POA: Diagnosis not present

## 2022-02-04 DIAGNOSIS — R2681 Unsteadiness on feet: Secondary | ICD-10-CM | POA: Diagnosis not present

## 2022-02-04 DIAGNOSIS — K219 Gastro-esophageal reflux disease without esophagitis: Secondary | ICD-10-CM | POA: Diagnosis not present

## 2022-02-04 DIAGNOSIS — G3184 Mild cognitive impairment, so stated: Secondary | ICD-10-CM | POA: Diagnosis not present

## 2022-02-04 DIAGNOSIS — R1310 Dysphagia, unspecified: Secondary | ICD-10-CM | POA: Diagnosis not present

## 2022-02-04 DIAGNOSIS — M6281 Muscle weakness (generalized): Secondary | ICD-10-CM | POA: Diagnosis not present

## 2022-02-04 DIAGNOSIS — R1312 Dysphagia, oropharyngeal phase: Secondary | ICD-10-CM | POA: Diagnosis not present

## 2022-02-05 DIAGNOSIS — R1312 Dysphagia, oropharyngeal phase: Secondary | ICD-10-CM | POA: Diagnosis not present

## 2022-02-05 DIAGNOSIS — R1310 Dysphagia, unspecified: Secondary | ICD-10-CM | POA: Diagnosis not present

## 2022-02-05 DIAGNOSIS — K219 Gastro-esophageal reflux disease without esophagitis: Secondary | ICD-10-CM | POA: Diagnosis not present

## 2022-02-05 DIAGNOSIS — G3184 Mild cognitive impairment, so stated: Secondary | ICD-10-CM | POA: Diagnosis not present

## 2022-02-08 DIAGNOSIS — R1312 Dysphagia, oropharyngeal phase: Secondary | ICD-10-CM | POA: Diagnosis not present

## 2022-02-08 DIAGNOSIS — R1310 Dysphagia, unspecified: Secondary | ICD-10-CM | POA: Diagnosis not present

## 2022-02-08 DIAGNOSIS — K219 Gastro-esophageal reflux disease without esophagitis: Secondary | ICD-10-CM | POA: Diagnosis not present

## 2022-02-08 DIAGNOSIS — G3184 Mild cognitive impairment, so stated: Secondary | ICD-10-CM | POA: Diagnosis not present

## 2022-02-09 DIAGNOSIS — R1310 Dysphagia, unspecified: Secondary | ICD-10-CM | POA: Diagnosis not present

## 2022-02-09 DIAGNOSIS — R1312 Dysphagia, oropharyngeal phase: Secondary | ICD-10-CM | POA: Diagnosis not present

## 2022-02-09 DIAGNOSIS — G3184 Mild cognitive impairment, so stated: Secondary | ICD-10-CM | POA: Diagnosis not present

## 2022-02-09 DIAGNOSIS — K219 Gastro-esophageal reflux disease without esophagitis: Secondary | ICD-10-CM | POA: Diagnosis not present

## 2022-02-10 DIAGNOSIS — G3184 Mild cognitive impairment, so stated: Secondary | ICD-10-CM | POA: Diagnosis not present

## 2022-02-10 DIAGNOSIS — K219 Gastro-esophageal reflux disease without esophagitis: Secondary | ICD-10-CM | POA: Diagnosis not present

## 2022-02-10 DIAGNOSIS — R1312 Dysphagia, oropharyngeal phase: Secondary | ICD-10-CM | POA: Diagnosis not present

## 2022-02-10 DIAGNOSIS — R1310 Dysphagia, unspecified: Secondary | ICD-10-CM | POA: Diagnosis not present

## 2022-02-11 DIAGNOSIS — K219 Gastro-esophageal reflux disease without esophagitis: Secondary | ICD-10-CM | POA: Diagnosis not present

## 2022-02-11 DIAGNOSIS — G3184 Mild cognitive impairment, so stated: Secondary | ICD-10-CM | POA: Diagnosis not present

## 2022-02-11 DIAGNOSIS — R1312 Dysphagia, oropharyngeal phase: Secondary | ICD-10-CM | POA: Diagnosis not present

## 2022-02-11 DIAGNOSIS — R1310 Dysphagia, unspecified: Secondary | ICD-10-CM | POA: Diagnosis not present

## 2022-02-12 DIAGNOSIS — K219 Gastro-esophageal reflux disease without esophagitis: Secondary | ICD-10-CM | POA: Diagnosis not present

## 2022-02-12 DIAGNOSIS — G3184 Mild cognitive impairment, so stated: Secondary | ICD-10-CM | POA: Diagnosis not present

## 2022-02-12 DIAGNOSIS — R1310 Dysphagia, unspecified: Secondary | ICD-10-CM | POA: Diagnosis not present

## 2022-02-12 DIAGNOSIS — R1312 Dysphagia, oropharyngeal phase: Secondary | ICD-10-CM | POA: Diagnosis not present

## 2022-02-14 DIAGNOSIS — K219 Gastro-esophageal reflux disease without esophagitis: Secondary | ICD-10-CM | POA: Diagnosis not present

## 2022-02-14 DIAGNOSIS — G3184 Mild cognitive impairment, so stated: Secondary | ICD-10-CM | POA: Diagnosis not present

## 2022-02-14 DIAGNOSIS — R1312 Dysphagia, oropharyngeal phase: Secondary | ICD-10-CM | POA: Diagnosis not present

## 2022-02-14 DIAGNOSIS — R1310 Dysphagia, unspecified: Secondary | ICD-10-CM | POA: Diagnosis not present

## 2022-02-16 DIAGNOSIS — K219 Gastro-esophageal reflux disease without esophagitis: Secondary | ICD-10-CM | POA: Diagnosis not present

## 2022-02-16 DIAGNOSIS — R1312 Dysphagia, oropharyngeal phase: Secondary | ICD-10-CM | POA: Diagnosis not present

## 2022-02-16 DIAGNOSIS — R1310 Dysphagia, unspecified: Secondary | ICD-10-CM | POA: Diagnosis not present

## 2022-02-16 DIAGNOSIS — G3184 Mild cognitive impairment, so stated: Secondary | ICD-10-CM | POA: Diagnosis not present

## 2022-02-17 DIAGNOSIS — R1312 Dysphagia, oropharyngeal phase: Secondary | ICD-10-CM | POA: Diagnosis not present

## 2022-02-17 DIAGNOSIS — G3184 Mild cognitive impairment, so stated: Secondary | ICD-10-CM | POA: Diagnosis not present

## 2022-02-17 DIAGNOSIS — K219 Gastro-esophageal reflux disease without esophagitis: Secondary | ICD-10-CM | POA: Diagnosis not present

## 2022-02-17 DIAGNOSIS — R1310 Dysphagia, unspecified: Secondary | ICD-10-CM | POA: Diagnosis not present

## 2022-02-18 DIAGNOSIS — G3184 Mild cognitive impairment, so stated: Secondary | ICD-10-CM | POA: Diagnosis not present

## 2022-02-18 DIAGNOSIS — R1312 Dysphagia, oropharyngeal phase: Secondary | ICD-10-CM | POA: Diagnosis not present

## 2022-02-18 DIAGNOSIS — K219 Gastro-esophageal reflux disease without esophagitis: Secondary | ICD-10-CM | POA: Diagnosis not present

## 2022-02-18 DIAGNOSIS — R1310 Dysphagia, unspecified: Secondary | ICD-10-CM | POA: Diagnosis not present

## 2022-02-22 DIAGNOSIS — R1310 Dysphagia, unspecified: Secondary | ICD-10-CM | POA: Diagnosis not present

## 2022-02-22 DIAGNOSIS — K219 Gastro-esophageal reflux disease without esophagitis: Secondary | ICD-10-CM | POA: Diagnosis not present

## 2022-02-22 DIAGNOSIS — R1312 Dysphagia, oropharyngeal phase: Secondary | ICD-10-CM | POA: Diagnosis not present

## 2022-02-22 DIAGNOSIS — G3184 Mild cognitive impairment, so stated: Secondary | ICD-10-CM | POA: Diagnosis not present

## 2022-02-23 DIAGNOSIS — K219 Gastro-esophageal reflux disease without esophagitis: Secondary | ICD-10-CM | POA: Diagnosis not present

## 2022-02-23 DIAGNOSIS — G3184 Mild cognitive impairment, so stated: Secondary | ICD-10-CM | POA: Diagnosis not present

## 2022-02-23 DIAGNOSIS — R1310 Dysphagia, unspecified: Secondary | ICD-10-CM | POA: Diagnosis not present

## 2022-02-23 DIAGNOSIS — R1312 Dysphagia, oropharyngeal phase: Secondary | ICD-10-CM | POA: Diagnosis not present

## 2022-02-24 DIAGNOSIS — R1312 Dysphagia, oropharyngeal phase: Secondary | ICD-10-CM | POA: Diagnosis not present

## 2022-02-24 DIAGNOSIS — G3184 Mild cognitive impairment, so stated: Secondary | ICD-10-CM | POA: Diagnosis not present

## 2022-02-24 DIAGNOSIS — R1310 Dysphagia, unspecified: Secondary | ICD-10-CM | POA: Diagnosis not present

## 2022-02-24 DIAGNOSIS — K219 Gastro-esophageal reflux disease without esophagitis: Secondary | ICD-10-CM | POA: Diagnosis not present

## 2022-02-25 DIAGNOSIS — K219 Gastro-esophageal reflux disease without esophagitis: Secondary | ICD-10-CM | POA: Diagnosis not present

## 2022-02-25 DIAGNOSIS — R1312 Dysphagia, oropharyngeal phase: Secondary | ICD-10-CM | POA: Diagnosis not present

## 2022-02-25 DIAGNOSIS — R1310 Dysphagia, unspecified: Secondary | ICD-10-CM | POA: Diagnosis not present

## 2022-02-25 DIAGNOSIS — G3184 Mild cognitive impairment, so stated: Secondary | ICD-10-CM | POA: Diagnosis not present

## 2022-02-26 DIAGNOSIS — R1312 Dysphagia, oropharyngeal phase: Secondary | ICD-10-CM | POA: Diagnosis not present

## 2022-02-26 DIAGNOSIS — R1310 Dysphagia, unspecified: Secondary | ICD-10-CM | POA: Diagnosis not present

## 2022-02-26 DIAGNOSIS — K219 Gastro-esophageal reflux disease without esophagitis: Secondary | ICD-10-CM | POA: Diagnosis not present

## 2022-02-26 DIAGNOSIS — G3184 Mild cognitive impairment, so stated: Secondary | ICD-10-CM | POA: Diagnosis not present

## 2022-03-02 DIAGNOSIS — K219 Gastro-esophageal reflux disease without esophagitis: Secondary | ICD-10-CM | POA: Diagnosis not present

## 2022-03-02 DIAGNOSIS — R1310 Dysphagia, unspecified: Secondary | ICD-10-CM | POA: Diagnosis not present

## 2022-03-02 DIAGNOSIS — G3184 Mild cognitive impairment, so stated: Secondary | ICD-10-CM | POA: Diagnosis not present

## 2022-03-02 DIAGNOSIS — R1312 Dysphagia, oropharyngeal phase: Secondary | ICD-10-CM | POA: Diagnosis not present

## 2022-03-04 DIAGNOSIS — G3184 Mild cognitive impairment, so stated: Secondary | ICD-10-CM | POA: Diagnosis not present

## 2022-03-04 DIAGNOSIS — R1310 Dysphagia, unspecified: Secondary | ICD-10-CM | POA: Diagnosis not present

## 2022-03-04 DIAGNOSIS — K219 Gastro-esophageal reflux disease without esophagitis: Secondary | ICD-10-CM | POA: Diagnosis not present

## 2022-03-04 DIAGNOSIS — R1312 Dysphagia, oropharyngeal phase: Secondary | ICD-10-CM | POA: Diagnosis not present

## 2022-04-08 DIAGNOSIS — Z23 Encounter for immunization: Secondary | ICD-10-CM | POA: Diagnosis not present

## 2022-04-12 ENCOUNTER — Telehealth: Payer: Self-pay

## 2022-04-12 ENCOUNTER — Non-Acute Institutional Stay: Payer: Medicare Other | Admitting: Nurse Practitioner

## 2022-04-12 ENCOUNTER — Encounter: Payer: Self-pay | Admitting: Nurse Practitioner

## 2022-04-12 DIAGNOSIS — R053 Chronic cough: Secondary | ICD-10-CM | POA: Diagnosis not present

## 2022-04-12 DIAGNOSIS — K219 Gastro-esophageal reflux disease without esophagitis: Secondary | ICD-10-CM

## 2022-04-12 DIAGNOSIS — N390 Urinary tract infection, site not specified: Secondary | ICD-10-CM | POA: Diagnosis not present

## 2022-04-12 DIAGNOSIS — N1831 Chronic kidney disease, stage 3a: Secondary | ICD-10-CM | POA: Diagnosis not present

## 2022-04-12 DIAGNOSIS — D509 Iron deficiency anemia, unspecified: Secondary | ICD-10-CM | POA: Diagnosis not present

## 2022-04-12 DIAGNOSIS — M545 Low back pain, unspecified: Secondary | ICD-10-CM

## 2022-04-12 DIAGNOSIS — M5136 Other intervertebral disc degeneration, lumbar region: Secondary | ICD-10-CM | POA: Diagnosis not present

## 2022-04-12 DIAGNOSIS — E785 Hyperlipidemia, unspecified: Secondary | ICD-10-CM | POA: Diagnosis not present

## 2022-04-12 DIAGNOSIS — R627 Adult failure to thrive: Secondary | ICD-10-CM | POA: Diagnosis not present

## 2022-04-12 DIAGNOSIS — E876 Hypokalemia: Secondary | ICD-10-CM

## 2022-04-12 DIAGNOSIS — I10 Essential (primary) hypertension: Secondary | ICD-10-CM | POA: Diagnosis not present

## 2022-04-12 DIAGNOSIS — I872 Venous insufficiency (chronic) (peripheral): Secondary | ICD-10-CM

## 2022-04-12 DIAGNOSIS — S32010A Wedge compression fracture of first lumbar vertebra, initial encounter for closed fracture: Secondary | ICD-10-CM | POA: Diagnosis not present

## 2022-04-12 DIAGNOSIS — M533 Sacrococcygeal disorders, not elsewhere classified: Secondary | ICD-10-CM | POA: Diagnosis not present

## 2022-04-12 DIAGNOSIS — R102 Pelvic and perineal pain: Secondary | ICD-10-CM | POA: Diagnosis not present

## 2022-04-12 DIAGNOSIS — F0390 Unspecified dementia without behavioral disturbance: Secondary | ICD-10-CM | POA: Diagnosis not present

## 2022-04-12 MED ORDER — TRAMADOL HCL 50 MG PO TABS: 25.0000 mg | ORAL_TABLET | Freq: Three times a day (TID) | ORAL | 0 refills | Status: DC

## 2022-04-12 NOTE — Progress Notes (Unsigned)
Location:   [place] Room Number: KA768-T Place of Service:  ALF (13) Provider: Lennie Odor Prudie Guthridge NP  [name], MD  Patient Care Team: [name], MD as PCP - General (Internal Medicine) [name], MD as Consulting Physician (Cardiology)  Extended Emergency Contact Information Primary Emergency Contact: Osborne Casco Address: [address]          [place], [place] [address] [place] Phone: [phone] Mobile Phone: [phone] Relation: Niece  Code Status: DNR Goals of care: Advanced Directive information    04/12/2022   11:31 AM  Advanced Directives  Does Patient Have a Medical Advance Directive? Yes  Type of Paramedic of [place];Living will  Does patient want to make changes to medical advance directive? No - Patient declined  Copy of Trail in Chart? Yes - validated most recent copy scanned in chart (See row information)     Chief Complaint  Patient presents with   Acute Visit    Low back pain    HPI:  Pt is a 88 y.o. female seen today for an acute visit for the right lower back pain, still able to walk and bear weight. Denied injury.      Weight loss, takes Mirtazapine, stabilized.              09/12/21 choledocholithiasis/ascending cholangitis s/p ERCP and stone removal. Severe sepsis/E coli bacteremia- fully treated. AST/ALT 17/9 11/10/21             Chronic cough, on Benzonatate              CKD, stage 3, Bun/creat 20/1.4 11/10/21             GERD, better indigestion, burping, on Omeprazole. prn Maalox             Edema BLE, off Furosemide, not new,  EF 50-55% 06/01/2017.              Hypokalemia, on KCL, K 4.1 11/10/21             HTN, takes Amlodipine             Hyperlipidemia, LDL 77 06/18/21, off Rosuvastatin             Dementia, gradual decline, 06/26/20 MMSE 23/30, failed clock drawing. TSH 1.313 09/13/21             Anemia, Vit B12 228 04/03/20, added Vit B  216/22, off Fe, Hgb 12.1 09/18/21  Past Medical History:  Diagnosis Date   Arthritis    "right thumb" (01/29/2015)   Chronic shoulder pain    "between my shoulders" (01/29/2015)   Dizziness    Esophageal stricture 2009   Gallstones    GERD (gastroesophageal reflux disease) 2004   Hemorrhoids 2004   Hiatal hernia 2004   History of blood transfusion 1947   "appendix ruptured"   HOH (hard of hearing)    Hypertension    Rhinitis, allergic    Syncopal episodes    Past Surgical History:  Procedure Laterality Date   APPENDECTOMY  1947   "ruptured"   CARDIAC CATHETERIZATION  10/23/1999; 09/2014   EF 60% No significant CAD; patent coronary arteries.   CARPAL TUNNEL RELEASE Right 1990's   CHOLECYSTECTOMY N/A 01/29/2015   Procedure: LAPAROSCOPIC CHOLECYSTECTOMY;  Surgeon: Rolm Bookbinder, MD;  Location: [facility];  Service: General;  Laterality: N/A;   DILATION AND CURETTAGE OF UTERUS  "couple"  ERCP N/A 09/13/2021   Procedure: ENDOSCOPIC RETROGRADE CHOLANGIOPANCREATOGRAPHY (ERCP);  Surgeon: Carol Ada, MD;  Location: [facility];  Service: Gastroenterology;  Laterality: N/A;   ESOPHAGOGASTRODUODENOSCOPY (EGD) WITH ESOPHAGEAL DILATION  2-3 times   HEMORROIDECTOMY     KNEE ARTHROSCOPY Bilateral    right x 2   LAPAROSCOPIC CHOLECYSTECTOMY  01/29/2015   LEFT HEART CATHETERIZATION WITH CORONARY ANGIOGRAM N/A 09/16/2014   Procedure: LEFT HEART CATHETERIZATION WITH CORONARY ANGIOGRAM;  Surgeon: [name], MD;  Location: [facility] CATH LAB;  Service: Cardiovascular;  Laterality: N/A;   NM MYOVIEW LTD  06/01/2012   EF 71%   REMOVAL OF STONES  09/13/2021   Procedure: REMOVAL OF STONES;  Surgeon: Carol Ada, MD;  Location: [facility];  Service: Gastroenterology;;   [name]  09/13/2021   Procedure: [name];  Surgeon: Carol Ada, MD;  Location: [facility];  Service: Gastroenterology;;   VAGINAL HYSTERECTOMY  1970's    Allergies  Allergen Reactions   Contrast Media [Iodinated  Contrast Media] Itching and Rash    Pt covered in rash, red.   Codeine Nausea And Vomiting    in large doses: can tolerate Tussionex    Allergies as of 04/12/2022       Reactions   Contrast Media [iodinated Contrast Media] Itching, Rash   Pt covered in rash, red.   Codeine Nausea And Vomiting   in large doses: can tolerate Tussionex        Medication List        Accurate as of April 12, 2022 11:59 PM. If you have any questions, ask your nurse or doctor.          acetaminophen 325 MG tablet Commonly known as: TYLENOL Take 650 mg by mouth every 6 (six) hours as needed for mild pain. What changed: Another medication with the same name was removed. Continue taking this medication, and follow the directions you see here. Changed by: Lorean Ekstrand X Ryler Laskowski, NP   alum & mag hydroxide-simeth 628-366-29 MG/5ML suspension Commonly known as: MAALOX PLUS Take 10 mLs by mouth every 6 (six) hours as needed for indigestion or heartburn.   amLODipine 2.5 MG tablet Commonly known as: NORVASC Take 1 tablet (2.5 mg total) by mouth daily.   benzonatate 100 MG capsule Commonly known as: TESSALON Take 1 capsule by mouth 3 (three) times daily.   cyanocobalamin 1000 MCG tablet Commonly known as: VITAMIN B12 Take 1,000 mcg by mouth daily.   mirtazapine 7.5 MG tablet Commonly known as: REMERON Take 7.5 mg by mouth at bedtime.   omeprazole 40 MG capsule Commonly known as: PRILOSEC Take 40 mg by mouth daily.   ondansetron 4 MG disintegrating tablet Commonly known as: ZOFRAN-ODT Take 4 mg by mouth every 6 (six) hours as needed for nausea or vomiting.   Resource 2.0 Liqd Take by mouth. 180 ml; oral Twice A Day Special Instructions: administer with med pass   traMADol 50 MG tablet Commonly known as: ULTRAM Take 0.5 tablets (25 mg total) by mouth 3 (three) times daily for 7 days. Started by: Sunya Humbarger X Jyra Lagares, NP        Review of Systems  Constitutional:  Negative for appetite change,  fatigue and fever.  HENT:  Positive for hearing loss. Negative for congestion and trouble swallowing.   Respiratory:  Positive for cough. Negative for chest tightness and shortness of breath.   Cardiovascular:  Positive for leg swelling.  Gastrointestinal:  Negative for abdominal pain and constipation.  Genitourinary:  Positive for frequency. Negative  for dysuria and urgency.       Baseline urinary frequency  Musculoskeletal:  Positive for back pain and gait problem.  Skin:  Negative for color change.  Neurological:  Negative for speech difficulty, weakness and light-headedness.       Memory lapses.   Psychiatric/Behavioral:  Negative for behavioral problems and sleep disturbance. The patient is not nervous/anxious.     Immunization History  Administered Date(s) Administered   Influenza Split 03/16/2011, 03/01/2012   Influenza Whole 03/28/2007, 04/01/2009, 02/18/2010   Influenza, High Dose Seasonal PF 03/14/2013   Influenza,inj,Quad PF,6+ Mos 03/20/2014, 03/11/2015   Influenza-Unspecified 08/06/2018, 03/20/2020   Moderna SARS-COV2 Booster Vaccination 11/04/2020   Moderna Sars-Covid-2 Vaccination 06/11/2019, 07/09/2019, 02/24/2021   Pneumococcal Conjugate-13 02/25/2015   Pneumococcal Polysaccharide-23 03/28/2007   Td 12/20/2005   Pertinent  Health Maintenance Due  Topic Date Due   MAMMOGRAM  09/15/2013   INFLUENZA VACCINE  01/05/2022   DEXA SCAN  Completed      09/14/2021   12:00 PM 09/14/2021    8:15 PM 09/15/2021    3:00 PM 09/15/2021    8:00 PM 09/16/2021   10:06 AM  Fall Risk  Patient Fall Risk Level High fall risk High fall risk High fall risk High fall risk High fall risk   Functional Status Survey:    Vitals:   04/12/22 1117  BP: (!) 150/72  Pulse: 71  Resp: 19  Temp: 98 F (36.7 C)  SpO2: 97%  Weight: 181 lb (82.1 kg)  Height: 5' (1.524 m)   Body mass index is 35.35 kg/m. Physical Exam Constitutional:      Appearance: Normal appearance.  HENT:      Head: Normocephalic and atraumatic.     Mouth/Throat:     Mouth: Mucous membranes are moist.  Eyes:     Extraocular Movements: Extraocular movements intact.     Conjunctiva/sclera: Conjunctivae normal.     Pupils: Pupils are equal, round, and reactive to light.  Cardiovascular:     Rate and Rhythm: Normal rate and regular rhythm.     Heart sounds: No murmur heard. Pulmonary:     Effort: Pulmonary effort is normal.     Breath sounds: No rales.  Abdominal:     General: Bowel sounds are normal.     Palpations: Abdomen is soft.     Tenderness: There is no abdominal tenderness. There is no right CVA tenderness, left CVA tenderness, guarding or rebound.  Musculoskeletal:        General: Tenderness present.     Cervical back: Normal range of motion and neck supple.     Right lower leg: Edema present.     Left lower leg: Edema present.     Comments: BLE chronic venous insufficiency skin changes. Trace edema BLE. Right lower back pain palpated, no spinal spinous tenderness noted, no left hip pain palpated.   Skin:    General: Skin is warm and dry.  Neurological:     General: No focal deficit present.     Mental Status: She is alert. Mental status is at baseline.     Gait: Gait abnormal.     Comments: Oriented to person, her room on unit.   Psychiatric:        Mood and Affect: Mood normal.        Behavior: Behavior normal.     Labs reviewed: Recent Labs    09/12/21 2108 09/13/21 0443 09/14/21 0908 09/15/21 0529 09/16/21 0500 09/18/21 0000 09/22/21 0000 10/05/21 0000  11/10/21 0000  NA 138 139 137 135 142   < > 141 140 141  K 3.2* 4.2 4.3 3.9 3.5   < > 3.9 4.6 4.1  CL 101 103 110 112* 117*   < > 106 107 107  CO2 23 25 17* 17* 19*   < > 22 21 27*  GLUCOSE 106* 108* 161* 108* 81  --   --   --   --   BUN 24* 24* 33* 30* 28*   < > 16 23* 20  CREATININE 2.20* 2.10* 2.54* 2.21* 1.89*   < > 1.6* 2.2* 1.4*  CALCIUM 9.2 8.6* 7.6* 7.6* 8.3*   < > 8.9 9.6 8.4*  MG 2.5* 2.3  --   --    --   --   --   --   --   PHOS  --   --   --   --  3.0  --   --   --   --    < > = values in this interval not displayed.   Recent Labs    09/13/21 0443 09/14/21 0908 09/15/21 0529 09/16/21 0500 09/18/21 0000 11/10/21 0000  AST 157* 69* 41  --  24 17  ALT 78* 58* 41  --  20 9  ALKPHOS 838* 672* 551*  --  452* 69  BILITOT 5.1* 1.5* 1.1  --   --   --   PROT 5.6* 5.5* 5.3*  --   --   --   ALBUMIN 2.7* 2.6* 2.5* 2.5* 2.8* 2.8*   Recent Labs    09/14/21 0908 09/15/21 0529 09/16/21 0500 09/18/21 0000  WBC 15.1* 16.6* 9.8 3.6  NEUTROABS 13.7* 13.9* 5.9 4,398.00  HGB 13.0 12.1 11.5* 12.1  HCT 40.8 36.2 35.6* 36  MCV 103.0* 99.5 101.4*  --   PLT 225 208 205 209   Lab Results  Component Value Date   TSH 1.313 09/13/2021   Lab Results  Component Value Date   HGBA1C 5.8 07/11/2017   Lab Results  Component Value Date   CHOL 136 06/18/2021   HDL 34 (A) 06/18/2021   LDLCALC 77 06/18/2021   LDLDIRECT 161.2 07/18/2013   TRIG 151 06/18/2021   CHOLHDL 2.9 04/03/2020    Significant Diagnostic Results in last 30 days:  No results found.  Assessment/Plan: Backache R sacral pain, able to walk, Xray lumbar, sacral spine, pelvis, Tylenol '500mg'$  tid x 1 week, UA C/S to r/o UTI 04/12/2022 X-ray compression fracture of L1 seen of uncertain age.   Adult failure to thrive Weight is stable, continue Mirtazapine.   COUGH, CHRONIC Chronic, on Benzonatate   CKD (chronic kidney disease) stage 3, GFR 30-59 ml/min (HCC) stage 3, Bun/creat 20/1.4 11/10/21  GERD (gastroesophageal reflux disease) better indigestion, burping, on Omeprazole. prn Maalox  Edema of both lower extremities due to peripheral venous insufficiency off Furosemide, not new,  EF 50-55% 06/01/2017.   Hypokalemia  on KCL, K 4.1 11/10/21  Hypertension Blood pressure is controlled, continue Amlodipine.   Hyperlipidemia LDL 77 06/18/21, off Rosuvastatin  Senile dementia ([place]) gradual decline, 06/26/20 MMSE 23/30,  failed clock drawing. TSH 1.313 09/13/21  Anemia, iron deficiency Vit B12 228 04/03/20, added Vit B 216/22, off Fe, Hgb 12.1 09/18/21    Family/ staff Communication: plan of care reviewed with the patient and charge nurse.   Labs/tests ordered:  X-ray lumbar spine, sacral spine, pelvis, UA C/S  Time spend 40 minutes.

## 2022-04-12 NOTE — Telephone Encounter (Signed)
[phone] has sent fax over requesting Tramadol for patient. This medication isnt on patient list. Medication pend and sent to Mast, Man, NP for approval. Please Advise.

## 2022-04-12 NOTE — Assessment & Plan Note (Addendum)
R sacral pain, able to walk, Xray lumbar, sacral spine, pelvis, Tylenol '500mg'$  tid x 1 week, UA C/S to r/o UTI 04/12/2022 X-ray compression fracture of L1 seen of uncertain age.

## 2022-04-12 NOTE — Assessment & Plan Note (Signed)
LDL 77 06/18/21, off Rosuvastatin

## 2022-04-12 NOTE — Assessment & Plan Note (Signed)
stage 3, Bun/creat 20/1.4 11/10/21

## 2022-04-12 NOTE — Assessment & Plan Note (Signed)
Chronic, on Benzonatate

## 2022-04-12 NOTE — Assessment & Plan Note (Signed)
on KCL, K 4.1 11/10/21

## 2022-04-12 NOTE — Assessment & Plan Note (Signed)
better indigestion, burping, on Omeprazole. prn Maalox 

## 2022-04-12 NOTE — Assessment & Plan Note (Signed)
gradual decline, 06/26/20 MMSE 23/30, failed clock drawing. TSH 1.313 09/13/21

## 2022-04-12 NOTE — Assessment & Plan Note (Signed)
Vit B12 228 04/03/20, added Vit B 216/22, off Fe, Hgb 12.1 09/18/21 

## 2022-04-12 NOTE — Assessment & Plan Note (Signed)
Weight is stable, continue Mirtazapine.

## 2022-04-12 NOTE — Assessment & Plan Note (Signed)
Blood pressure is controlled, continue Amlodipine.

## 2022-04-12 NOTE — Assessment & Plan Note (Signed)
off Furosemide, not new,  EF 50-55% 06/01/2017.

## 2022-04-13 ENCOUNTER — Encounter: Payer: Self-pay | Admitting: Nurse Practitioner

## 2022-04-20 NOTE — Telephone Encounter (Signed)
Message routed back to Mast Man, NP

## 2022-04-27 ENCOUNTER — Non-Acute Institutional Stay: Payer: Medicare Other | Admitting: Family Medicine

## 2022-04-27 DIAGNOSIS — K8050 Calculus of bile duct without cholangitis or cholecystitis without obstruction: Secondary | ICD-10-CM | POA: Diagnosis not present

## 2022-04-27 DIAGNOSIS — D509 Iron deficiency anemia, unspecified: Secondary | ICD-10-CM | POA: Diagnosis not present

## 2022-04-27 DIAGNOSIS — R1011 Right upper quadrant pain: Secondary | ICD-10-CM

## 2022-04-27 DIAGNOSIS — W19XXXD Unspecified fall, subsequent encounter: Secondary | ICD-10-CM

## 2022-04-27 DIAGNOSIS — R627 Adult failure to thrive: Secondary | ICD-10-CM | POA: Diagnosis not present

## 2022-04-27 DIAGNOSIS — R413 Other amnesia: Secondary | ICD-10-CM

## 2022-04-27 DIAGNOSIS — I10 Essential (primary) hypertension: Secondary | ICD-10-CM

## 2022-04-27 DIAGNOSIS — R609 Edema, unspecified: Secondary | ICD-10-CM

## 2022-04-27 NOTE — Telephone Encounter (Signed)
Noted  

## 2022-04-27 NOTE — Progress Notes (Signed)
Provider:  Alain Honey, MD Location:      Place of Service:     PCP: [name], MD Patient Care Team: [name], MD as PCP - General (Internal Medicine) [name], MD as Consulting Physician (Cardiology)  Extended Emergency Contact Information Primary Emergency Contact: Osborne Casco Address: [address]          [place], [place] [address] [place] Phone: [phone] Mobile Phone: [phone] Relation: Niece  Code Status:  Goals of Care: Advanced Directive information    04/12/2022   11:31 AM  Advanced Directives  Does Patient Have a Medical Advance Directive? Yes  Type of Paramedic of [place];Living will  Does patient want to make changes to medical advance directive? No - Patient declined  Copy of [place] in Chart? Yes - validated most recent copy scanned in chart (See row information)      No chief complaint on file.   HPI: Patient is a 88 y.o. female seen today for medical management of chronic problems including hypertension, hard of hearing, mild dementia, and GERD. I visited her in her room today.  Without reading her medical record ahead of time, I had no knowledge of her medical issues.  She denies any problems today but tells me she did have remote history of a right knee procedure by Dr. Alvester Chou, who incidentally is her cardiologist.  There is some confusion obviously.  She does however recall the name of her neighbor down the hall. She tells me her appetite is good as is her sleep and bowel and bladder function.  Past Medical History:  Diagnosis Date   Arthritis    "right thumb" (01/29/2015)   Chronic shoulder pain    "between my shoulders" (01/29/2015)   Dizziness    Esophageal stricture 2009   Gallstones    GERD (gastroesophageal reflux disease) 2004   Hemorrhoids 2004   Hiatal hernia 2004   History of blood transfusion 1947   "appendix ruptured"   HOH  (hard of hearing)    Hypertension    Rhinitis, allergic    Syncopal episodes    Past Surgical History:  Procedure Laterality Date   APPENDECTOMY  1947   "ruptured"   CARDIAC CATHETERIZATION  10/23/1999; 09/2014   EF 60% No significant CAD; patent coronary arteries.   CARPAL TUNNEL RELEASE Right 1990's   CHOLECYSTECTOMY N/A 01/29/2015   Procedure: LAPAROSCOPIC CHOLECYSTECTOMY;  Surgeon: Rolm Bookbinder, MD;  Location: [facility];  Service: General;  Laterality: N/A;   DILATION AND CURETTAGE OF UTERUS  "couple"   ERCP N/A 09/13/2021   Procedure: ENDOSCOPIC RETROGRADE CHOLANGIOPANCREATOGRAPHY (ERCP);  Surgeon: Carol Ada, MD;  Location: [facility];  Service: Gastroenterology;  Laterality: N/A;   ESOPHAGOGASTRODUODENOSCOPY (EGD) WITH ESOPHAGEAL DILATION  2-3 times   HEMORROIDECTOMY     KNEE ARTHROSCOPY Bilateral    right x 2   LAPAROSCOPIC CHOLECYSTECTOMY  01/29/2015   LEFT HEART CATHETERIZATION WITH CORONARY ANGIOGRAM N/A 09/16/2014   Procedure: LEFT HEART CATHETERIZATION WITH CORONARY ANGIOGRAM;  Surgeon: [name], MD;  Location: [facility];  Service: Cardiovascular;  Laterality: N/A;   NM MYOVIEW LTD  06/01/2012   EF 71%   REMOVAL OF STONES  09/13/2021   Procedure: REMOVAL OF STONES;  Surgeon: Carol Ada, MD;  Location: [facility];  Service: Gastroenterology;;   [name]  09/13/2021   Procedure: [name];  Surgeon: Carol Ada, MD;  Location: [facility];  Service: Gastroenterology;;  VAGINAL HYSTERECTOMY  1970's    reports that she has never smoked. She has never used smokeless tobacco. She reports that she does not drink alcohol and does not use drugs. Social History   Socioeconomic History   Marital status: Widowed    Spouse name: Not on file   Number of children: 0   Years of education: Not on file   Highest education level: Not on file  Occupational History   Occupation: retired    Fish farm manager: RETIRED    Comment: school cafeteria mgr  Tobacco Use    Smoking status: Never   Smokeless tobacco: Never  Vaping Use   Vaping Use: Never used  Substance and Sexual Activity   Alcohol use: No    Alcohol/week: 0.0 standard drinks of alcohol   Drug use: No   Sexual activity: Never  Other Topics Concern   Not on file  Social History Narrative   Patient does not consume alcohol or use tobacco products.   She does drink/eat things with caffeine in it.   Marital status: widow (married in [place])   Patient lives in an assisted living 1 story apartment home   Highest level of education completed was high school.   Past profession: home maker   Exercise: walk 3 x week   Patient has a POA and living   Social Determinants of Health   Financial Resource Strain: Not on file  Food Insecurity: Not on file  Transportation Needs: Not on file  Physical Activity: Not on file  Stress: Not on file  Social Connections: Not on file  Intimate Partner Violence: Not on file    Functional Status Survey:    Family History  Problem Relation Age of Onset   Stroke Father 71       light stroke   Heart disease Sister    COPD Sister    Heart disease Brother    Coronary artery disease Neg Hx    Diabetes Neg Hx    Cancer Neg Hx        breast, colon, prostate    Health Maintenance  Topic Date Due   Zoster Vaccines- Shingrix (1 of 2) Never done   MAMMOGRAM  09/15/2013   INFLUENZA VACCINE  01/05/2022   COVID-19 Vaccine (5 - 2023-24 season) 02/05/2022   Medicare Annual Wellness (AWV)  05/06/2022   Pneumonia Vaccine 35+ Years old  Completed   DEXA SCAN  Completed   HPV VACCINES  Aged Out    Allergies  Allergen Reactions   Contrast Media [Iodinated Contrast Media] Itching and Rash    Pt covered in rash, red.   Codeine Nausea And Vomiting    in large doses: can tolerate Tussionex    Outpatient Encounter Medications as of 04/27/2022  Medication Sig   acetaminophen (TYLENOL) 325 MG tablet Take 650 mg by mouth every 6 (six) hours as needed for mild  pain.   alum & mag hydroxide-simeth (MAALOX PLUS) 400-400-40 MG/5ML suspension Take 10 mLs by mouth every 6 (six) hours as needed for indigestion or heartburn.   amLODipine (NORVASC) 2.5 MG tablet Take 1 tablet (2.5 mg total) by mouth daily.   benzonatate (TESSALON) 100 MG capsule Take 1 capsule by mouth 3 (three) times daily.   mirtazapine (REMERON) 7.5 MG tablet Take 7.5 mg by mouth at bedtime.   Nutritional Supplements (RESOURCE 2.0) LIQD Take by mouth. 180 ml; oral Twice A Day Special Instructions: administer with med pass   omeprazole (PRILOSEC) 40 MG capsule Take  40 mg by mouth daily.   ondansetron (ZOFRAN-ODT) 4 MG disintegrating tablet Take 4 mg by mouth every 6 (six) hours as needed for nausea or vomiting.   vitamin B-12 (CYANOCOBALAMIN) 1000 MCG tablet Take 1,000 mcg by mouth daily.   No facility-administered encounter medications on file as of 04/27/2022.    Review of Systems  Constitutional: Negative.   HENT:  Positive for hearing loss.   Respiratory: Negative.    Cardiovascular:  Positive for leg swelling.  Neurological:  Positive for dizziness.  Psychiatric/Behavioral:  Positive for confusion.   All other systems reviewed and are negative.   There were no vitals filed for this visit. There is no height or weight on file to calculate BMI. Physical Exam Vitals and nursing note reviewed.  Constitutional:      Appearance: Normal appearance.  HENT:     Head: Normocephalic.     Mouth/Throat:     Mouth: Mucous membranes are moist.     Pharynx: Oropharynx is clear.  Eyes:     Extraocular Movements: Extraocular movements intact.     Pupils: Pupils are equal, round, and reactive to light.  Cardiovascular:     Rate and Rhythm: Normal rate and regular rhythm.  Pulmonary:     Effort: Pulmonary effort is normal.     Breath sounds: Normal breath sounds.  Abdominal:     General: Bowel sounds are normal.     Palpations: Abdomen is soft.  Musculoskeletal:     Right lower  leg: Edema present.     Left lower leg: Edema present.  Skin:    General: Skin is warm and dry.  Neurological:     General: No focal deficit present.     Mental Status: She is alert.     Comments: Oriented to person and place  Psychiatric:        Mood and Affect: Mood normal.        Behavior: Behavior normal.     Labs reviewed: Basic Metabolic Panel: Recent Labs    09/12/21 2108 09/13/21 0443 09/14/21 0908 09/15/21 0529 09/16/21 0500 09/18/21 0000 09/22/21 0000 10/05/21 0000 11/10/21 0000  NA 138 139 137 135 142   < > 141 140 141  K 3.2* 4.2 4.3 3.9 3.5   < > 3.9 4.6 4.1  CL 101 103 110 112* 117*   < > 106 107 107  CO2 23 25 17* 17* 19*   < > 22 21 27*  GLUCOSE 106* 108* 161* 108* 81  --   --   --   --   BUN 24* 24* 33* 30* 28*   < > 16 23* 20  CREATININE 2.20* 2.10* 2.54* 2.21* 1.89*   < > 1.6* 2.2* 1.4*  CALCIUM 9.2 8.6* 7.6* 7.6* 8.3*   < > 8.9 9.6 8.4*  MG 2.5* 2.3  --   --   --   --   --   --   --   PHOS  --   --   --   --  3.0  --   --   --   --    < > = values in this interval not displayed.   Liver Function Tests: Recent Labs    09/13/21 0443 09/14/21 0908 09/15/21 0529 09/16/21 0500 09/18/21 0000 11/10/21 0000  AST 157* 69* 41  --  24 17  ALT 78* 58* 41  --  20 9  ALKPHOS 838* 672* 551*  --  452* 69  BILITOT 5.1* 1.5* 1.1  --   --   --   PROT 5.6* 5.5* 5.3*  --   --   --   ALBUMIN 2.7* 2.6* 2.5* 2.5* 2.8* 2.8*   Recent Labs    09/12/21 2108  LIPASE 36   Recent Labs    09/12/21 2258  AMMONIA 46*   CBC: Recent Labs    09/14/21 0908 09/15/21 0529 09/16/21 0500 09/18/21 0000  WBC 15.1* 16.6* 9.8 3.6  NEUTROABS 13.7* 13.9* 5.9 4,398.00  HGB 13.0 12.1 11.5* 12.1  HCT 40.8 36.2 35.6* 36  MCV 103.0* 99.5 101.4*  --   PLT 225 208 205 209   Cardiac Enzymes: Recent Labs    09/12/21 2108  CKTOTAL 50   BNP: Invalid input(s): "POCBNP" Lab Results  Component Value Date   HGBA1C 5.8 07/11/2017   Lab Results  Component Value Date    TSH 1.313 09/13/2021   Lab Results  Component Value Date   VITAMINB12 228 04/03/2020   Lab Results  Component Value Date   FOLATE 8.4 04/13/2010   Lab Results  Component Value Date   IRON 23 08/27/2020   TIBC 368 08/27/2020   FERRITIN 6 08/27/2020    Imaging and Procedures obtained prior to SNF admission: DG ERCP  Result Date: 09/14/2021 CLINICAL DATA:  Choledocholithiasis EXAM: ERCP TECHNIQUE: Multiple spot images obtained with the fluoroscopic device and submitted for interpretation post-procedure. FLUOROSCOPY: Radiation Exposure Index (as provided by the fluoroscopic device): 67.53 mGy Kerma COMPARISON:  CT abdomen/pelvis 09/12/2021 FINDINGS: Total of 6 intraoperative saved images are submitted for review. The images demonstrate a flexible duodenal scope in the descending duodenum with wire cannulation of the common bile duct. Subsequently, balloon occluded cholangiogram is performed demonstrating biliary ductal dilation. Filling defects in the distal common bile duct suggest choledocholithiasis. Subsequent images document sphincterotomy and balloon sweep of the common duct. IMPRESSION: 1. Choledocholithiasis. 2. ERCP with sphincterotomy and balloon sweeping of the common duct. These images were submitted for radiologic interpretation only. Please see the procedural report for the amount of contrast and the fluoroscopy time utilized. Electronically Signed   By: Jacqulynn Cadet M.D.   On: 09/14/2021 08:03   CT Abdomen Pelvis Wo Contrast  Result Date: 09/12/2021 CLINICAL DATA:  Nausea/vomiting. unwitnessed fall at facility. Was found face down with vomit around her. EXAM: CT ABDOMEN AND PELVIS WITHOUT CONTRAST TECHNIQUE: Multidetector CT imaging of the abdomen and pelvis was performed following the standard protocol without IV contrast. RADIATION DOSE REDUCTION: This exam was performed according to the departmental dose-optimization program which includes automated exposure control,  adjustment of the mA and/or kV according to patient size and/or use of iterative reconstruction technique. COMPARISON:  None. FINDINGS: Slightly limited evaluation due to motion artifact. Lower chest: No acute abnormality.  Tiny hiatal hernia. Liver: Mildly enlarged measuring up to 18 cm.  No focal lesion. Biliary System: Status post cholecystectomy. Enlarged common bile duct measuring up to 1.4 cm with associated intraluminal hyperdensities measuring approximately and 0.70.8 cm. Pancreas: Diffusely atrophic. No focal lesion. Query slight fat stranding along the proximal pancreas (2:31). No main pancreatic ductal dilatation. Spleen: Not enlarged. No focal lesion. Adrenal Glands: No nodularity bilaterally. Kidneys: No hydroureteronephrosis. No nephroureterolithiasis. No contour deforming renal mass. Anterior urinary bladder diverticula. Otherwise the urinary bladder is unremarkable. Bowel: No small or large bowel wall thickening or dilatation. Status post appendectomy. Mesentery, Omentum, and Peritoneum: No simple free fluid ascites. No pneumoperitoneum. No mesenteric hematoma identified. No organized fluid collection. Pelvic  Organs: Status post hysterectomy. Bilateral adnexal regions are unremarkable. Lymph Nodes: No abdominal, pelvic, inguinal lymphadenopathy. Vasculature: Severe atherosclerotic plaque. No abdominal aorta or iliac aneurysm. Musculoskeletal: No significant soft tissue hematoma. Small to moderate umbilical hernia containing fat with abdominal wall defect of 3.3 cm. No acute pelvic fracture. No spinal fracture. Multilevel degenerative changes spine. IMPRESSION: 1. Findings suggestive of choledocholithiasis in a patient status post cholecystectomy. Correlate with liver function tests. 2. Query slight fat stranding along the proximal pancreas with limited evaluation due to motion artifact. Correlate with lipase levels. 3. No acute traumatic injury to the abdomen or pelvis on this noncontrast study. 4.  No acute fracture or traumatic malalignment of the lumbar spine. Other imaging findings of potential clinical significance: 1. Small hiatal hernia. 2. Fat containing umbilical hernia. A findings suggestive ischemia or bowel obstruction. 3. Aortic Atherosclerosis (ICD10-I70.0). 4. Status post cholecystectomy, appendectomy, hysterectomy. Electronically Signed   By: Iven Finn M.D.   On: 09/12/2021 23:37   CT Head Wo Contrast  Result Date: 09/12/2021 CLINICAL DATA:  Head trauma, minor (Age >= 65y); Polytrauma, blunt. Unwitnessed fall EXAM: CT HEAD WITHOUT CONTRAST CT CERVICAL SPINE WITHOUT CONTRAST TECHNIQUE: Multidetector CT imaging of the head and cervical spine was performed following the standard protocol without intravenous contrast. Multiplanar CT image reconstructions of the cervical spine were also generated. RADIATION DOSE REDUCTION: This exam was performed according to the departmental dose-optimization program which includes automated exposure control, adjustment of the mA and/or kV according to patient size and/or use of iterative reconstruction technique. COMPARISON:  None. FINDINGS: CT HEAD FINDINGS BRAIN: BRAIN Cerebral ventricle sizes are concordant with the degree of cerebral volume loss. Patchy and confluent areas of decreased attenuation are noted throughout the deep and periventricular white matter of the cerebral hemispheres bilaterally, compatible with chronic microvascular ischemic disease. No evidence of large-territorial acute infarction. No parenchymal hemorrhage. No mass lesion. No extra-axial collection. No mass effect or midline shift. No hydrocephalus. Basilar cisterns are patent. Vascular: No hyperdense vessel. Atherosclerotic calcifications are present within the cavernous internal carotid arteries. Skull: No acute fracture or focal lesion. Sinuses/Orbits: Trace mucosal thickening of the right maxillary sinus. Otherwise paranasal sinuses and mastoid air cells are clear. Bilateral  lens replacement. Otherwise the orbits are unremarkable. Other: None. CT CERVICAL SPINE FINDINGS Alignment: Normal. Skull base and vertebrae: Multilevel moderate degenerative changes spine. Severe osseous neural foraminal stenosis at the C4-C5 level. No acute fracture. No aggressive appearing focal osseous lesion or focal pathologic process. Soft tissues and spinal canal: No prevertebral fluid or swelling. No visible canal hematoma. Upper chest: Unremarkable. Other: None. IMPRESSION: 1. No acute intracranial abnormality. 2. No acute displaced fracture or traumatic listhesis of the cervical spine. Electronically Signed   By: Iven Finn M.D.   On: 09/12/2021 23:25   CT Cervical Spine Wo Contrast  Result Date: 09/12/2021 CLINICAL DATA:  Head trauma, minor (Age >= 65y); Polytrauma, blunt. Unwitnessed fall EXAM: CT HEAD WITHOUT CONTRAST CT CERVICAL SPINE WITHOUT CONTRAST TECHNIQUE: Multidetector CT imaging of the head and cervical spine was performed following the standard protocol without intravenous contrast. Multiplanar CT image reconstructions of the cervical spine were also generated. RADIATION DOSE REDUCTION: This exam was performed according to the departmental dose-optimization program which includes automated exposure control, adjustment of the mA and/or kV according to patient size and/or use of iterative reconstruction technique. COMPARISON:  None. FINDINGS: CT HEAD FINDINGS BRAIN: BRAIN Cerebral ventricle sizes are concordant with the degree of cerebral volume loss. Patchy and confluent areas  of decreased attenuation are noted throughout the deep and periventricular white matter of the cerebral hemispheres bilaterally, compatible with chronic microvascular ischemic disease. No evidence of large-territorial acute infarction. No parenchymal hemorrhage. No mass lesion. No extra-axial collection. No mass effect or midline shift. No hydrocephalus. Basilar cisterns are patent. Vascular: No hyperdense  vessel. Atherosclerotic calcifications are present within the cavernous internal carotid arteries. Skull: No acute fracture or focal lesion. Sinuses/Orbits: Trace mucosal thickening of the right maxillary sinus. Otherwise paranasal sinuses and mastoid air cells are clear. Bilateral lens replacement. Otherwise the orbits are unremarkable. Other: None. CT CERVICAL SPINE FINDINGS Alignment: Normal. Skull base and vertebrae: Multilevel moderate degenerative changes spine. Severe osseous neural foraminal stenosis at the C4-C5 level. No acute fracture. No aggressive appearing focal osseous lesion or focal pathologic process. Soft tissues and spinal canal: No prevertebral fluid or swelling. No visible canal hematoma. Upper chest: Unremarkable. Other: None. IMPRESSION: 1. No acute intracranial abnormality. 2. No acute displaced fracture or traumatic listhesis of the cervical spine. Electronically Signed   By: Iven Finn M.D.   On: 09/12/2021 23:25   DG Chest Port 1 View  Result Date: 09/12/2021 CLINICAL DATA:  Fever unwitnessed fall EXAM: PORTABLE CHEST 1 VIEW COMPARISON:  05/19/2017 FINDINGS: The heart size and mediastinal contours are within normal limits. Aortic atherosclerosis. Both lungs are clear. The visualized skeletal structures are unremarkable. IMPRESSION: No active disease. Electronically Signed   By: Donavan Foil M.D.   On: 09/12/2021 22:15    Assessment/Plan 1. ABDOMINAL PAIN, RIGHT UPPER QUADRANT Patient had successful ERCP and gallstones removed  2. Adult failure to thrive She is on mirtazapine and weight seems to have stabilized  3. Iron deficiency anemia, unspecified iron deficiency anemia type Most recent hemoglobin was in normal range  4. Choledocholithiasis See above 5. Edema, unspecified type She is off furosemide now.  She tells me she tries to keep her legs elevated when not walking  6. Fall, subsequent encounter A walker for ambulation no recent falls   7. Primary  hypertension Blood pressure is good on low-dose amlodipine  8. Memory loss No obvious issues with memory during our conversation but when I asked her to recall 3 words at 5 minutes she could only recall 1.  There is some gradual memory loss per history   Family/ staff Communication:   Labs/tests ordered:  .smmsig

## 2022-06-23 ENCOUNTER — Non-Acute Institutional Stay: Payer: Medicare Other | Admitting: Adult Health

## 2022-06-23 ENCOUNTER — Encounter: Payer: Self-pay | Admitting: Adult Health

## 2022-06-23 DIAGNOSIS — F0390 Unspecified dementia without behavioral disturbance: Secondary | ICD-10-CM

## 2022-06-23 DIAGNOSIS — I10 Essential (primary) hypertension: Secondary | ICD-10-CM

## 2022-06-23 DIAGNOSIS — R300 Dysuria: Secondary | ICD-10-CM | POA: Diagnosis not present

## 2022-06-23 DIAGNOSIS — M545 Low back pain, unspecified: Secondary | ICD-10-CM

## 2022-06-23 MED ORDER — AMLODIPINE BESYLATE 5 MG PO TABS: 5.0000 mg | ORAL_TABLET | Freq: Every day | ORAL | 11 refills | Status: DC

## 2022-06-23 NOTE — Progress Notes (Signed)
Location:  [facility] Room Number: AL/804/A Place of Service:  ALF (13) Provider:  Durenda Age, DNP, FNP-BC  Patient Care Team: [name], MD as PCP - General (Internal Medicine) [name], MD as Consulting Physician (Cardiology)  Extended Emergency Contact Information Primary Emergency Contact: Osborne Casco Address: [address]          [place], [place] [address] [place] Phone: [phone] Mobile Phone: [phone] Relation: Niece  Code Status:  DNR  Goals of care: Advanced Directive information    04/12/2022   11:31 AM  Advanced Directives  Does Patient Have a Medical Advance Directive? Yes  Type of Paramedic of [place];Living will  Does patient want to make changes to medical advance directive? No - Patient declined  Copy of [place] in Chart? Yes - validated most recent copy scanned in chart (See row information)     Chief Complaint  Patient presents with   Acute Visit    Patient is being seen for back pain    HPI:  Pt is a 89 y.o. female seen today for an acute visit regarding lower back pain. She is a resident of [facility]. She has a PMH of laparoscopic cholecystectomy in 2016, hypertension, chronic kidney disease stage III and hyperlipidemia. She complained of low back pain last night which was not relieved by Tylenol. Daughter reported that resident has frequent UTIs.    She was seen in her room watching tv with one leg elevated on her walker and drinking coffee. She complained that she has pain across her lower back for approx a week now. She denies having dysuria, hematuria, chills nor fever.No reported trauma.   Today BP 149/65, take Amlodipine 2.5 mg daily for hypertension.   Past Medical History:  Diagnosis Date   Arthritis    "right thumb" (01/29/2015)   Chronic shoulder pain    "between my shoulders" (01/29/2015)    Dizziness    Esophageal stricture 2009   Gallstones    GERD (gastroesophageal reflux disease) 2004   Hemorrhoids 2004   Hiatal hernia 2004   History of blood transfusion 1947   "appendix ruptured"   HOH (hard of hearing)    Hypertension    Rhinitis, allergic    Syncopal episodes    Past Surgical History:  Procedure Laterality Date   APPENDECTOMY  1947   "ruptured"   CARDIAC CATHETERIZATION  10/23/1999; 09/2014   EF 60% No significant CAD; patent coronary arteries.   CARPAL TUNNEL RELEASE Right 1990's   CHOLECYSTECTOMY N/A 01/29/2015   Procedure: LAPAROSCOPIC CHOLECYSTECTOMY;  Surgeon: Rolm Bookbinder, MD;  Location: [facility];  Service: General;  Laterality: N/A;   DILATION AND CURETTAGE OF UTERUS  "couple"   ERCP N/A 09/13/2021   Procedure: ENDOSCOPIC RETROGRADE CHOLANGIOPANCREATOGRAPHY (ERCP);  Surgeon: Carol Ada, MD;  Location: [facility];  Service: Gastroenterology;  Laterality: N/A;   ESOPHAGOGASTRODUODENOSCOPY (EGD) WITH ESOPHAGEAL DILATION  2-3 times   HEMORROIDECTOMY     KNEE ARTHROSCOPY Bilateral    right x 2   LAPAROSCOPIC CHOLECYSTECTOMY  01/29/2015   LEFT HEART CATHETERIZATION WITH CORONARY ANGIOGRAM N/A 09/16/2014   Procedure: LEFT HEART CATHETERIZATION WITH CORONARY ANGIOGRAM;  Surgeon: [name], MD;  Location: [facility];  Service: Cardiovascular;  Laterality: N/A;   NM MYOVIEW LTD  06/01/2012   EF 71%   REMOVAL OF STONES  09/13/2021   Procedure: REMOVAL OF STONES;  Surgeon: Benson Norway,  Saralyn Pilar, MD;  Location: [facility];  Service: Gastroenterology;;   [name]  09/13/2021   Procedure: [name];  Surgeon: Carol Ada, MD;  Location: [facility];  Service: Gastroenterology;;   VAGINAL HYSTERECTOMY  [phone]    Allergies  Allergen Reactions   Contrast Media [Iodinated Contrast Media] Itching and Rash    Pt covered in rash, red.   Codeine Nausea And Vomiting    in large doses: can tolerate Tussionex    Outpatient Encounter Medications as of  06/23/2022  Medication Sig   acetaminophen (TYLENOL) 325 MG tablet Take 650 mg by mouth every 6 (six) hours as needed for mild pain.   alum & mag hydroxide-simeth (MAALOX PLUS) 400-400-40 MG/5ML suspension Take 10 mLs by mouth every 6 (six) hours as needed for indigestion or heartburn.   amLODipine (NORVASC) 2.5 MG tablet Take 1 tablet (2.5 mg total) by mouth daily.   benzonatate (TESSALON) 100 MG capsule Take 1 capsule by mouth 3 (three) times daily.   mirtazapine (REMERON) 7.5 MG tablet Take 7.5 mg by mouth at bedtime.   Nutritional Supplements (RESOURCE 2.0) LIQD Take by mouth. 180 ml; oral Twice A Day Special Instructions: administer with med pass   omeprazole (PRILOSEC) 40 MG capsule Take 40 mg by mouth daily.   ondansetron (ZOFRAN-ODT) 4 MG disintegrating tablet Take 4 mg by mouth every 6 (six) hours as needed for nausea or vomiting.   vitamin B-12 (CYANOCOBALAMIN) 1000 MCG tablet Take 1,000 mcg by mouth daily.   No facility-administered encounter medications on file as of 06/23/2022.    Review of Systems  Constitutional:  Negative for appetite change, chills, fatigue and fever.  HENT:  Negative for congestion, hearing loss, rhinorrhea and sore throat.   Eyes: Negative.   Respiratory:  Negative for cough, shortness of breath and wheezing.   Cardiovascular:  Negative for chest pain, palpitations and leg swelling.  Gastrointestinal:  Negative for abdominal pain, constipation, diarrhea, nausea and vomiting.  Genitourinary:  Negative for dysuria.  Musculoskeletal:  Positive for back pain. Negative for arthralgias and myalgias.  Skin:  Negative for color change, rash and wound.  Neurological:  Negative for dizziness, weakness and headaches.  Psychiatric/Behavioral:  Negative for behavioral problems. The patient is not nervous/anxious.       Immunization History  Administered Date(s) Administered   Fluad Quad(high Dose 65+) 03/29/2022   Influenza Split 03/16/2011, 03/01/2012    Influenza Whole 03/28/2007, 04/01/2009, 02/18/2010   Influenza, High Dose Seasonal PF 03/14/2013   Influenza,inj,Quad PF,6+ Mos 03/20/2014, 03/11/2015   Influenza-Unspecified 08/06/2018, 03/20/2020   Moderna SARS-COV2 Booster Vaccination 11/04/2020   Moderna Sars-Covid-2 Vaccination 06/11/2019, 07/09/2019, 02/24/2021   PPD Test 04/23/2020, 11/03/2021   Pfizer Covid-19 Vaccine Bivalent Booster 38yr & up 04/08/2022   Pneumococcal Conjugate-13 02/25/2015   Pneumococcal Polysaccharide-23 03/28/2007   Td 12/20/2005   Pertinent  Health Maintenance Due  Topic Date Due   MAMMOGRAM  09/15/2013   INFLUENZA VACCINE  Completed   DEXA SCAN  Completed      09/14/2021   12:00 PM 09/14/2021    8:15 PM 09/15/2021    3:00 PM 09/15/2021    8:00 PM 09/16/2021   10:06 AM  Fall Risk  (RETIRED) Patient Fall Risk Level High fall risk High fall risk High fall risk High fall risk High fall risk     Vitals:   06/23/22 0922  BP: (!) 149/65  Pulse: 73  Resp: 20  Temp: 97.6 F (36.4 C)  SpO2: 98%  Weight: 187 lb 3.2  oz (84.9 kg)  Height: 5' (1.524 m)   Body mass index is 36.56 kg/m.  Physical Exam Constitutional:      Appearance: Normal appearance.  HENT:     Head: Normocephalic and atraumatic.     Nose: Nose normal.     Mouth/Throat:     Mouth: Mucous membranes are moist.  Eyes:     Conjunctiva/sclera: Conjunctivae normal.  Cardiovascular:     Rate and Rhythm: Normal rate and regular rhythm.  Pulmonary:     Effort: Pulmonary effort is normal.     Breath sounds: Normal breath sounds.  Abdominal:     General: Bowel sounds are normal.     Palpations: Abdomen is soft.  Musculoskeletal:        General: Normal range of motion.     Cervical back: Normal range of motion.  Skin:    General: Skin is warm and dry.  Neurological:     Mental Status: She is alert. Mental status is at baseline.  Psychiatric:        Mood and Affect: Mood normal.        Behavior: Behavior normal.        Thought  Content: Thought content normal.        Judgment: Judgment normal.        Labs reviewed: Recent Labs    09/12/21 2108 09/13/21 0443 09/14/21 0908 09/15/21 0529 09/16/21 0500 09/18/21 0000 09/22/21 0000 10/05/21 0000 11/10/21 0000  NA 138 139 137 135 142   < > 141 140 141  K 3.2* 4.2 4.3 3.9 3.5   < > 3.9 4.6 4.1  CL 101 103 110 112* 117*   < > 106 107 107  CO2 23 25 17* 17* 19*   < > 22 21 27*  GLUCOSE 106* 108* 161* 108* 81  --   --   --   --   BUN 24* 24* 33* 30* 28*   < > 16 23* 20  CREATININE 2.20* 2.10* 2.54* 2.21* 1.89*   < > 1.6* 2.2* 1.4*  CALCIUM 9.2 8.6* 7.6* 7.6* 8.3*   < > 8.9 9.6 8.4*  MG 2.5* 2.3  --   --   --   --   --   --   --   PHOS  --   --   --   --  3.0  --   --   --   --    < > = values in this interval not displayed.   Recent Labs    09/13/21 0443 09/14/21 0908 09/15/21 0529 09/16/21 0500 09/18/21 0000 11/10/21 0000  AST 157* 69* 41  --  24 17  ALT 78* 58* 41  --  20 9  ALKPHOS 838* 672* 551*  --  452* 69  BILITOT 5.1* 1.5* 1.1  --   --   --   PROT 5.6* 5.5* 5.3*  --   --   --   ALBUMIN 2.7* 2.6* 2.5* 2.5* 2.8* 2.8*   Recent Labs    09/14/21 0908 09/15/21 0529 09/16/21 0500 09/18/21 0000  WBC 15.1* 16.6* 9.8 3.6  NEUTROABS 13.7* 13.9* 5.9 4,398.00  HGB 13.0 12.1 11.5* 12.1  HCT 40.8 36.2 35.6* 36  MCV 103.0* 99.5 101.4*  --   PLT 225 208 205 209   Lab Results  Component Value Date   TSH 1.313 09/13/2021   Lab Results  Component Value Date   HGBA1C 5.8 07/11/2017   Lab Results  Component Value Date   CHOL 136 06/18/2021   HDL 34 (A) 06/18/2021   LDLCALC 77 06/18/2021   LDLDIRECT 161.2 07/18/2013   TRIG 151 06/18/2021   CHOLHDL 2.9 04/03/2020    Significant Diagnostic Results in last 30 days:  No results found.  Assessment/Plan  1. Acute bilateral low back pain without sciatica -  no reported trauma  -  continue Tylenol PRN -  will get urinalysis with CS to rule out UTI  2. Primary hypertension -  BP 149/65,  SBPs ranging from 130 to 149 -  will increase Amlodipine 2.5 mg daily to 5 mg daily -  monitor BPs  3. Senile dementia ([place]) -  continue supportive care -  fall precautions   Family/ staff Communication: Discussed plan of care with resident and charge nurse.  Labs/tests ordered:   Urinalysis with culture and sensitivity    Durenda Age, DNP, MSN, FNP-BC [place] [phone] (Monday-Friday 8:00 a.m. - 5:00 p.m.) [phone] (after hours)

## 2022-07-02 ENCOUNTER — Encounter: Payer: Self-pay | Admitting: Nurse Practitioner

## 2022-07-02 ENCOUNTER — Non-Acute Institutional Stay: Payer: Medicare Other | Admitting: Nurse Practitioner

## 2022-07-02 DIAGNOSIS — E785 Hyperlipidemia, unspecified: Secondary | ICD-10-CM | POA: Diagnosis not present

## 2022-07-02 DIAGNOSIS — N1831 Chronic kidney disease, stage 3a: Secondary | ICD-10-CM

## 2022-07-02 DIAGNOSIS — K219 Gastro-esophageal reflux disease without esophagitis: Secondary | ICD-10-CM | POA: Diagnosis not present

## 2022-07-02 DIAGNOSIS — R609 Edema, unspecified: Secondary | ICD-10-CM

## 2022-07-02 DIAGNOSIS — I872 Venous insufficiency (chronic) (peripheral): Secondary | ICD-10-CM | POA: Diagnosis not present

## 2022-07-02 DIAGNOSIS — R627 Adult failure to thrive: Secondary | ICD-10-CM | POA: Diagnosis not present

## 2022-07-02 DIAGNOSIS — F0390 Unspecified dementia without behavioral disturbance: Secondary | ICD-10-CM

## 2022-07-02 DIAGNOSIS — D509 Iron deficiency anemia, unspecified: Secondary | ICD-10-CM | POA: Diagnosis not present

## 2022-07-02 DIAGNOSIS — I10 Essential (primary) hypertension: Secondary | ICD-10-CM | POA: Diagnosis not present

## 2022-07-02 DIAGNOSIS — R053 Chronic cough: Secondary | ICD-10-CM | POA: Diagnosis not present

## 2022-07-02 NOTE — Assessment & Plan Note (Signed)
Blood pressure is controlled,  takes Amlodipine 

## 2022-07-02 NOTE — Assessment & Plan Note (Signed)
on Benzonatate

## 2022-07-02 NOTE — Assessment & Plan Note (Signed)
better indigestion, burping, on Omeprazole. prn Maalox 

## 2022-07-02 NOTE — Progress Notes (Signed)
Location:  [facility] Room Number: AL/804/A Place of Service:  ALF (13) Provider:  Vidal Lampkins X, NP   Patient Care Team: [name], MD as PCP - General (Internal Medicine) [name], MD as Consulting Physician (Cardiology)  Extended Emergency Contact Information Primary Emergency Contact: Osborne Casco Address: [address]          [place], [place] [address] [place] Phone: [phone] Mobile Phone: [phone] Relation: Niece  Code Status:  DNR Goals of care: Advanced Directive information    07/02/2022    9:41 AM  Advanced Directives  Does Patient Have a Medical Advance Directive? Yes  Type of Paramedic of [place];Out of facility DNR (pink MOST or yellow form)  Does patient want to make changes to medical advance directive? No - Patient declined  Copy of [place] in Chart? Yes - validated most recent copy scanned in chart (See row information)     Chief Complaint  Patient presents with   Medical Management of Chronic Issues    Patient is here for a follow up for chronic conditions    Quality Metric Gaps    Patient is due for AWV and Mammogram   Immunizations    Patient is due for updated Tdap and discuss the need for shingrix vaccine+    HPI:  Pt is a 89 y.o. female seen today for medical management of chronic diseases.      weigh gained about #6Ibs in the past 3 months, BMI 36.56, takes Mirtazapine              09/12/21 choledocholithiasis/ascending cholangitis s/p ERCP and stone removal. Severe sepsis/E coli bacteremia- fully treated. AST/ALT 17/9 11/10/21             Chronic cough, on Benzonatate              CKD, stage 3, Bun/creat 20/1.4 11/10/21             GERD, better indigestion, burping, on Omeprazole. prn Maalox             Edema BLE, off Furosemide, not new,  EF 50-55% 06/01/2017.             HTN, takes Amlodipine             Hyperlipidemia, LDL  77 06/18/21, off Rosuvastatin             Dementia, gradual decline, 06/26/20 MMSE 23/30, failed clock drawing. TSH 1.313 09/13/21             Anemia, Vit B12 228 04/03/20, added Vit B 216/22, off Fe, Hgb 12.1 09/18/21   Past Medical History:  Diagnosis Date   Arthritis    "right thumb" (01/29/2015)   Chronic shoulder pain    "between my shoulders" (01/29/2015)   Dizziness    Esophageal stricture 2009   Gallstones    GERD (gastroesophageal reflux disease) 2004   Hemorrhoids 2004   Hiatal hernia 2004   History of blood transfusion 1947   "appendix ruptured"   HOH (hard of hearing)    Hypertension    Rhinitis, allergic    Syncopal episodes    Past Surgical History:  Procedure Laterality Date   APPENDECTOMY  1947   "ruptured"   CARDIAC CATHETERIZATION  10/23/1999; 09/2014   EF 60% No significant CAD; patent coronary arteries.   CARPAL TUNNEL RELEASE Right 1990's   CHOLECYSTECTOMY N/A 01/29/2015  Procedure: LAPAROSCOPIC CHOLECYSTECTOMY;  Surgeon: Rolm Bookbinder, MD;  Location: [facility];  Service: General;  Laterality: N/A;   DILATION AND CURETTAGE OF UTERUS  "couple"   ERCP N/A 09/13/2021   Procedure: ENDOSCOPIC RETROGRADE CHOLANGIOPANCREATOGRAPHY (ERCP);  Surgeon: Carol Ada, MD;  Location: [facility];  Service: Gastroenterology;  Laterality: N/A;   ESOPHAGOGASTRODUODENOSCOPY (EGD) WITH ESOPHAGEAL DILATION  2-3 times   HEMORROIDECTOMY     KNEE ARTHROSCOPY Bilateral    right x 2   LAPAROSCOPIC CHOLECYSTECTOMY  01/29/2015   LEFT HEART CATHETERIZATION WITH CORONARY ANGIOGRAM N/A 09/16/2014   Procedure: LEFT HEART CATHETERIZATION WITH CORONARY ANGIOGRAM;  Surgeon: [name], MD;  Location: [facility] LAB;  Service: Cardiovascular;  Laterality: N/A;   NM MYOVIEW LTD  06/01/2012   EF 71%   REMOVAL OF STONES  09/13/2021   Procedure: REMOVAL OF STONES;  Surgeon: Carol Ada, MD;  Location: [facility];  Service: Gastroenterology;;   [name]  09/13/2021   Procedure:  [name];  Surgeon: Carol Ada, MD;  Location: [facility];  Service: Gastroenterology;;   VAGINAL HYSTERECTOMY  1970's    Allergies  Allergen Reactions   Contrast Media [Iodinated Contrast Media] Itching and Rash    Pt covered in rash, red.   Codeine Nausea And Vomiting    in large doses: can tolerate Tussionex    Outpatient Encounter Medications as of 07/02/2022  Medication Sig   acetaminophen (TYLENOL) 325 MG tablet Take 650 mg by mouth every 6 (six) hours as needed for mild pain.   alum & mag hydroxide-simeth (MAALOX PLUS) 400-400-40 MG/5ML suspension Take 10 mLs by mouth every 6 (six) hours as needed for indigestion or heartburn.   amLODipine (NORVASC) 5 MG tablet Take 1 tablet (5 mg total) by mouth daily.   benzonatate (TESSALON) 100 MG capsule Take 1 capsule by mouth 3 (three) times daily.   mirtazapine (REMERON) 7.5 MG tablet Take 7.5 mg by mouth at bedtime.   Nutritional Supplements (RESOURCE 2.0) LIQD Take by mouth. 180 ml; oral Twice A Day Special Instructions: administer with med pass   omeprazole (PRILOSEC) 40 MG capsule Take 40 mg by mouth daily.   ondansetron (ZOFRAN-ODT) 4 MG disintegrating tablet Take 4 mg by mouth every 6 (six) hours as needed for nausea or vomiting.   vitamin B-12 (CYANOCOBALAMIN) 1000 MCG tablet Take 1,000 mcg by mouth daily.   No facility-administered encounter medications on file as of 07/02/2022.    Review of Systems  Constitutional:  Positive for unexpected weight change. Negative for appetite change, fatigue and fever.       Weight gained about #6Ibs in the past 3 months.   HENT:  Positive for hearing loss. Negative for congestion and trouble swallowing.   Respiratory:  Positive for cough. Negative for chest tightness and shortness of breath.   Cardiovascular:  Positive for leg swelling.  Gastrointestinal:  Negative for abdominal pain and constipation.  Genitourinary:  Positive for frequency. Negative for dysuria and urgency.        Baseline urinary frequency  Musculoskeletal:  Positive for back pain and gait problem.  Skin:  Negative for color change.  Neurological:  Negative for speech difficulty, weakness and light-headedness.       Memory lapses.   Psychiatric/Behavioral:  Negative for behavioral problems and sleep disturbance. The patient is not nervous/anxious.     Immunization History  Administered Date(s) Administered   Fluad Quad(high Dose 65+) 03/29/2022   Influenza Split 03/16/2011, 03/01/2012   Influenza Whole 03/28/2007, 04/01/2009, 02/18/2010  Influenza, High Dose Seasonal PF 03/14/2013   Influenza,inj,Quad PF,6+ Mos 03/20/2014, 03/11/2015   Influenza-Unspecified 08/06/2018, 03/20/2020   Moderna SARS-COV2 Booster Vaccination 11/04/2020   Moderna Sars-Covid-2 Vaccination 06/11/2019, 07/09/2019, 02/24/2021   PPD Test 04/23/2020, 11/03/2021   Pfizer Covid-19 Vaccine Bivalent Booster 60yr & up 04/08/2022   Pneumococcal Conjugate-13 02/25/2015   Pneumococcal Polysaccharide-23 03/28/2007   Td 12/20/2005   Pertinent  Health Maintenance Due  Topic Date Due   MAMMOGRAM  09/15/2013   INFLUENZA VACCINE  Completed   DEXA SCAN  Completed      09/14/2021    8:15 PM 09/15/2021    3:00 PM 09/15/2021    8:00 PM 09/16/2021   10:06 AM 07/02/2022    9:42 AM  Fall Risk  Falls in the past year?     0  Was there an injury with Fall?     0  Fall Risk Category Calculator     0  (RETIRED) Patient Fall Risk Level High fall risk High fall risk High fall risk High fall risk   Patient at Risk for Falls Due to     No Fall Risks  Fall risk Follow up     Falls evaluation completed   Functional Status Survey:    Vitals:   07/02/22 0945  BP: 135/79  Pulse: 63  Resp: 18  Temp: (!) 97.5 F (36.4 C)  SpO2: 95%  Weight: 187 lb 3.2 oz (84.9 kg)  Height: 5' (1.524 m)   Body mass index is 36.56 kg/m. Physical Exam Constitutional:      Appearance: Normal appearance.  HENT:     Head: Normocephalic and atraumatic.      Mouth/Throat:     Mouth: Mucous membranes are moist.  Eyes:     Extraocular Movements: Extraocular movements intact.     Conjunctiva/sclera: Conjunctivae normal.     Pupils: Pupils are equal, round, and reactive to light.  Cardiovascular:     Rate and Rhythm: Normal rate and regular rhythm.     Heart sounds: No murmur heard. Pulmonary:     Effort: Pulmonary effort is normal.     Breath sounds: No rales.  Abdominal:     General: Bowel sounds are normal.     Palpations: Abdomen is soft.     Tenderness: There is no abdominal tenderness. There is no right CVA tenderness, left CVA tenderness, guarding or rebound.  Musculoskeletal:     Cervical back: Normal range of motion and neck supple.     Right lower leg: Edema present.     Left lower leg: Edema present.     Comments: BLE chronic venous insufficiency skin changes. Trace edema BLE.   Skin:    General: Skin is warm and dry.  Neurological:     General: No focal deficit present.     Mental Status: She is alert. Mental status is at baseline.     Gait: Gait abnormal.     Comments: Oriented to person, her room on unit.   Psychiatric:        Mood and Affect: Mood normal.        Behavior: Behavior normal.     Labs reviewed: Recent Labs    09/12/21 2108 09/13/21 0443 09/14/21 0908 09/15/21 0529 09/16/21 0500 09/18/21 0000 09/22/21 0000 10/05/21 0000 11/10/21 0000  NA 138 139 137 135 142   < > 141 140 141  K 3.2* 4.2 4.3 3.9 3.5   < > 3.9 4.6 4.1  CL 101 103 110  112* 117*   < > 106 107 107  CO2 23 25 17* 17* 19*   < > 22 21 27*  GLUCOSE 106* 108* 161* 108* 81  --   --   --   --   BUN 24* 24* 33* 30* 28*   < > 16 23* 20  CREATININE 2.20* 2.10* 2.54* 2.21* 1.89*   < > 1.6* 2.2* 1.4*  CALCIUM 9.2 8.6* 7.6* 7.6* 8.3*   < > 8.9 9.6 8.4*  MG 2.5* 2.3  --   --   --   --   --   --   --   PHOS  --   --   --   --  3.0  --   --   --   --    < > = values in this interval not displayed.   Recent Labs    09/13/21 0443  09/14/21 0908 09/15/21 0529 09/16/21 0500 09/18/21 0000 11/10/21 0000  AST 157* 69* 41  --  24 17  ALT 78* 58* 41  --  20 9  ALKPHOS 838* 672* 551*  --  452* 69  BILITOT 5.1* 1.5* 1.1  --   --   --   PROT 5.6* 5.5* 5.3*  --   --   --   ALBUMIN 2.7* 2.6* 2.5* 2.5* 2.8* 2.8*   Recent Labs    09/14/21 0908 09/15/21 0529 09/16/21 0500 09/18/21 0000  WBC 15.1* 16.6* 9.8 3.6  NEUTROABS 13.7* 13.9* 5.9 4,398.00  HGB 13.0 12.1 11.5* 12.1  HCT 40.8 36.2 35.6* 36  MCV 103.0* 99.5 101.4*  --   PLT 225 208 205 209   Lab Results  Component Value Date   TSH 1.313 09/13/2021   Lab Results  Component Value Date   HGBA1C 5.8 07/11/2017   Lab Results  Component Value Date   CHOL 136 06/18/2021   HDL 34 (A) 06/18/2021   LDLCALC 77 06/18/2021   LDLDIRECT 161.2 07/18/2013   TRIG 151 06/18/2021   CHOLHDL 2.9 04/03/2020    Significant Diagnostic Results in last 30 days:  No results found.  Assessment/Plan Adult failure to thrive  weigh gained about #6Ibs in the past 3 months, BMI 36.56, dc Mirtazapine  COUGH, CHRONIC on Benzonatate   CKD (chronic kidney disease) stage 3, GFR 30-59 ml/min (HCC) Bun/creat 20/1.4 11/10/21  GERD (gastroesophageal reflux disease) better indigestion, burping, on Omeprazole. prn Maalox  DEPENDENT EDEMA off Furosemide, not new,  EF 50-55% 06/01/2017.   Edema of both lower extremities due to peripheral venous insufficiency off Furosemide, not new,  EF 50-55% 06/01/2017.  Hypertension Blood pressure is controlled,  takes Amlodipine  Hyperlipidemia  LDL 77 06/18/21, off Rosuvastatin  Senile dementia ([facility]) gradual decline, 06/26/20 MMSE 23/30, failed clock drawing. TSH 1.313 09/13/21, update MMSE  Anemia, iron deficiency Vit B12 228 04/03/20, added Vit B 216/22, off Fe, Hgb 12.1 09/18/21    Family/ staff Communication: plan of care reviewed with the patient and charge nurse  Labs/tests ordered: none  Time spend 40 minutes.

## 2022-07-02 NOTE — Assessment & Plan Note (Signed)
off Furosemide, not new,  EF 50-55% 06/01/2017.

## 2022-07-02 NOTE — Assessment & Plan Note (Signed)
Bun/creat 20/1.4 11/10/21

## 2022-07-02 NOTE — Assessment & Plan Note (Signed)
Vit B12 228 04/03/20, added Vit B 216/22, off Fe, Hgb 12.1 09/18/21 

## 2022-07-02 NOTE — Progress Notes (Signed)
This encounter was created in error - please disregard.

## 2022-07-02 NOTE — Assessment & Plan Note (Signed)
gradual decline, 06/26/20 MMSE 23/30, failed clock drawing. TSH 1.313 09/13/21, update MMSE

## 2022-07-02 NOTE — Assessment & Plan Note (Signed)
weigh gained about #6Ibs in the past 3 months, BMI 36.56, dc Mirtazapine

## 2022-07-02 NOTE — Assessment & Plan Note (Signed)
LDL 77 06/18/21, off Rosuvastatin

## 2022-07-06 ENCOUNTER — Encounter: Payer: Self-pay | Admitting: Nurse Practitioner

## 2022-07-06 ENCOUNTER — Non-Acute Institutional Stay (SKILLED_NURSING_FACILITY): Payer: Medicare Other | Admitting: Nurse Practitioner

## 2022-07-06 DIAGNOSIS — F0390 Unspecified dementia without behavioral disturbance: Secondary | ICD-10-CM | POA: Diagnosis not present

## 2022-07-06 DIAGNOSIS — Z0000 Encounter for general adult medical examination without abnormal findings: Secondary | ICD-10-CM | POA: Diagnosis not present

## 2022-07-06 NOTE — Progress Notes (Signed)
Subjective:   Deanna Shields is a 89 y.o. female who presents for Medicare Annual (Subsequent) preventive examination @ [facility].  Cardiac Risk Factors include: advanced age (>58mn, >>21women);obesity (BMI >30kg/m2);hypertension;dyslipidemia     Objective:    Today's Vitals   07/06/22 1319 07/06/22 1322  BP: 128/68   Pulse: 70   Resp: 18   Temp: 97.8 F (36.6 C)   SpO2: 97%   Weight: 187 lb 3.2 oz (84.9 kg)   PainSc:  5    Body mass index is 36.56 kg/m.     07/02/2022    9:41 AM 04/12/2022   11:31 AM 01/19/2022   11:57 AM 12/17/2021    9:43 AM 11/09/2021    9:59 AM 10/01/2021    9:17 AM 09/25/2021    8:47 AM  Advanced Directives  Does Patient Have a Medical Advance Directive? Yes Yes Yes Yes Yes Yes Yes  Type of AParamedicof ATierra AmarillaOut of facility DNR (pink MOST or yellow form) H[place]te[place]Living will HCannondaleLiving will;Out of facility DNR (pink MOST or yellow form) HLeonardoLiving will;Out of facility DNR (pink MOST or yellow form) HSarcoxieLiving will;Out of facility DNR (pink MOST or yellow form) Healthcare Power of [place] Does patient want to make changes to medical advance directive? No - Patient declined No - Patient declined No - Patient declined No - Patient declined No - Patient declined No - Patient declined No - Patient declined  Copy of HKelsoin Chart? Yes - validated most recent copy scanned in chart (See row information) Yes - validated most recent copy scanned in chart (See row information) Yes - validated most recent copy scanned in chart (See row information) Yes - validated most recent copy scanned in chart (See row information) Yes - validated most recent copy scanned in chart (See row information) Yes - validated most recent copy scanned in chart (See row information) Yes - validated most  recent copy scanned in chart (See row information)    Current Medications (verified) Outpatient Encounter Medications as of 07/06/2022  Medication Sig   acetaminophen (TYLENOL) 325 MG tablet Take 650 mg by mouth every 6 (six) hours as needed for mild pain.   alum & mag hydroxide-simeth (MAALOX PLUS) 400-400-40 MG/5ML suspension Take 10 mLs by mouth every 6 (six) hours as needed for indigestion or heartburn.   amLODipine (NORVASC) 5 MG tablet Take 1 tablet (5 mg total) by mouth daily.   benzonatate (TESSALON) 100 MG capsule Take 1 capsule by mouth 3 (three) times daily.   Nutritional Supplements (RESOURCE 2.0) LIQD Take by mouth. 180 ml; oral Twice A Day Special Instructions: administer with med pass   omeprazole (PRILOSEC) 40 MG capsule Take 40 mg by mouth daily.   ondansetron (ZOFRAN-ODT) 4 MG disintegrating tablet Take 4 mg by mouth every 6 (six) hours as needed for nausea or vomiting.   vitamin B-12 (CYANOCOBALAMIN) 1000 MCG tablet Take 1,000 mcg by mouth daily.   [DISCONTINUED] mirtazapine (REMERON) 7.5 MG tablet Take 7.5 mg by mouth at bedtime.   No facility-administered encounter medications on file as of 07/06/2022.    Allergies (verified) Contrast media [iodinated contrast media] and Codeine   History: Past Medical History:  Diagnosis Date   Arthritis    "right thumb" (01/29/2015)   Chronic shoulder pain    "between my shoulders" (01/29/2015)   Dizziness  Esophageal stricture 2009   Gallstones    GERD (gastroesophageal reflux disease) 2004   Hemorrhoids 2004   Hiatal hernia 2004   History of blood transfusion 1947   "appendix ruptured"   HOH (hard of hearing)    Hypertension    Rhinitis, allergic    Syncopal episodes    Past Surgical History:  Procedure Laterality Date   APPENDECTOMY  1947   "ruptured"   CARDIAC CATHETERIZATION  10/23/1999; 09/2014   EF 60% No significant CAD; patent coronary arteries.   CARPAL TUNNEL RELEASE Right 1990's   CHOLECYSTECTOMY N/A  01/29/2015   Procedure: LAPAROSCOPIC CHOLECYSTECTOMY;  Surgeon: Rolm Bookbinder, MD;  Location: [facility];  Service: General;  Laterality: N/A;   DILATION AND CURETTAGE OF UTERUS  "couple"   ERCP N/A 09/13/2021   Procedure: ENDOSCOPIC RETROGRADE CHOLANGIOPANCREATOGRAPHY (ERCP);  Surgeon: Carol Ada, MD;  Location: [facility];  Service: Gastroenterology;  Laterality: N/A;   ESOPHAGOGASTRODUODENOSCOPY (EGD) WITH ESOPHAGEAL DILATION  2-3 times   HEMORROIDECTOMY     KNEE ARTHROSCOPY Bilateral    right x 2   LAPAROSCOPIC CHOLECYSTECTOMY  01/29/2015   LEFT HEART CATHETERIZATION WITH CORONARY ANGIOGRAM N/A 09/16/2014   Procedure: LEFT HEART CATHETERIZATION WITH CORONARY ANGIOGRAM;  Surgeon: [name], MD;  Location: [facility] CATH LAB;  Service: Cardiovascular;  Laterality: N/A;   NM MYOVIEW LTD  06/01/2012   EF 71%   REMOVAL OF STONES  09/13/2021   Procedure: REMOVAL OF STONES;  Surgeon: Carol Ada, MD;  Location: [facility];  Service: Gastroenterology;;   [name]  09/13/2021   Procedure: [name];  Surgeon: Carol Ada, MD;  Location: [facility];  Service: Gastroenterology;;   VAGINAL HYSTERECTOMY  [phone]   Family History  Problem Relation Age of Onset   Stroke Father 67       light stroke   Heart disease Sister    COPD Sister    Heart disease Brother    Coronary artery disease Neg Hx    Diabetes Neg Hx    Cancer Neg Hx        breast, colon, prostate   Social History   Socioeconomic History   Marital status: Widowed    Spouse name: Not on file   Number of children: 0   Years of education: Not on file   Highest education level: Not on file  Occupational History   Occupation: retired    Fish farm manager: RETIRED    Comment: school cafeteria mgr  Tobacco Use   Smoking status: Never   Smokeless tobacco: Never  Vaping Use   Vaping Use: Never used  Substance and Sexual Activity   Alcohol use: No    Alcohol/week: 0.0 standard drinks of alcohol   Drug use: No   Sexual  activity: Never  Other Topics Concern   Not on file  Social History Narrative   Patient does not consume alcohol or use tobacco products.   She does drink/eat things with caffeine in it.   Marital status: widow (married in [place])   Patient lives in an assisted living 1 story apartment home   Highest level of education completed was high school.   Past profession: home maker   Exercise: walk 3 x week   Patient has a POA and living   Social Determinants of Radio broadcast assistant Strain: Not on file  Food Insecurity: Not on file  Transportation Needs: Not on file  Physical Activity: Not on file  Stress: Not on file  Social Connections: Not on file  Tobacco Counseling Counseling given: Not Answered   Clinical Intake:  Pre-visit preparation completed: Yes  Pain : 0-10 Pain Score: 5  Pain Type: Chronic pain Pain Location: Back Pain Orientation: Lower Pain Descriptors / Indicators: Aching Pain Onset: More than a month ago Pain Frequency: Intermittent Pain Relieving Factors: rest, Tyelnol. Effect of Pain on Daily Activities: less walking.  Pain Relieving Factors: rest, Tyelnol.  BMI - recorded: 36.56 Nutritional Status: BMI > 30  Obese Nutritional Risks: None Diabetes: No  How often do you need to have someone help you when you read instructions, pamphlets, or other written materials from your doctor or pharmacy?: 4 - Often What is the last grade level you completed in school?: college  Diabetic?no  Interpreter Needed?: No  Information entered by :: Namine Beahm Bretta Bang NP   Activities of Daily Living    07/06/2022    1:25 PM 09/14/2021    4:00 AM  In your present state of health, do you have any difficulty performing the following activities:  Hearing? 1 1  Comment hearing aids   Vision? 0 1  Difficulty concentrating or making decisions? 1 1  Walking or climbing stairs? 1 1  Dressing or bathing? 1 1  Doing errands, shopping? 1 1  Preparing Food and eating  ? N   Using the Toilet? N   In the past six months, have you accidently leaked urine? Y   Do you have problems with loss of bowel control? N   Managing your Medications? Y   Managing your Finances? Y   Housekeeping or managing your Housekeeping? Y     Patient Care Team: [name], MD as PCP - General (Internal Medicine) [name], MD as Consulting Physician (Cardiology)  Indicate any recent Medical Services you may have received from other than [facility] providers in the past year (date may be approximate).     Assessment:   This is a routine wellness examination for Deanna.  Hearing/Vision screen No results found.  Dietary issues and exercise activities discussed: Current Exercise Habits: The patient does not participate in regular exercise at present, Exercise limited by: neurologic condition(s);orthopedic condition(s)   Goals Addressed             This Visit's Progress    Maintain Mobility and Function       Evidence-based guidance:  Acknowledge and validate impact of pain, loss of strength and potential disfigurement (hand osteoarthritis) on mental health and daily life, such as social isolation, anxiety, depression, impaired sexual relationship and   injury from falls.  Anticipate referral to physical or occupational therapy for assessment, therapeutic exercise and recommendation for adaptive equipment or assistive devices; encourage participation.  Assess impact on ability to perform activities of daily living, as well as engage in sports and leisure events or requirements of work or school.  Provide anticipatory guidance and reassurance about the benefit of exercise to maintain function; acknowledge and normalize fear that exercise may worsen symptoms.  Encourage regular exercise, at least 10 minutes at a time for 45 minutes per week; consider yoga, water exercise and proprioceptive exercises; encourage use of wearable activity tracker to increase motivation  and adherence.  Encourage maintenance or resumption of daily activities, including employment, as pain allows and with minimal exposure to trauma.  Assist patient to advocate for adaptations to the work environment.  Consider level of pain and function, gender, age, lifestyle, patient preference, quality of life, readiness and ?ocapacity to benefit? when recommending patients for orthopaedic  surgery consultation.  Explore strategies, such as changes to medication regimen or activity that enables patient to anticipate and manage flare-ups that increase deconditioning and disability.  Explore patient preferences; encourage exposure to a broader range of activities that have been avoided for fear of experiencing pain.  Identify barriers to participation in therapy or exercise, such as pain with activity, anticipated or imagined pain.  Monitor postoperative joint replacement or any preexisting joint replacement for ongoing pain and loss of function; provide social support and encouragement throughout recovery.   Notes:        Depression Screen    07/24/2018    1:17 PM 05/10/2016    1:20 PM 08/25/2015   11:26 AM 08/25/2015   10:48 AM 08/19/2014   10:49 AM 07/18/2013   11:01 AM 09/15/2012    9:55 AM  PHQ 2/9 Scores  PHQ - 2 Score 0 0 0 1 0 0 0  Exception Documentation   Patient refusal        Fall Risk    07/02/2022    9:42 AM 04/27/2019   10:06 AM 04/21/2018    1:12 PM 02/15/2017   10:04 AM 12/28/2016    9:35 AM  Fall Risk   Falls in the past year? 0 0 0 No No  Comment  [employer] Telephone Survey: data to providers prior to load [employer] Survey: data to providers prior to load  Emmi Telephone Survey: data to providers prior to load  Number falls in past yr: 0      Injury with Fall? 0      Risk for fall due to : No Fall Risks      Follow up Falls evaluation completed        [place]:  Any stairs in or around the home? Yes  If so, are there any  without handrails? No  Home free of loose throw rugs in walkways, pet beds, electrical cords, etc? Yes  Adequate lighting in your home to reduce risk of falls? Yes   ASSISTIVE DEVICES UTILIZED TO PREVENT FALLS:  Life alert? No  Use of a cane, walker or w/c? Yes  Grab bars in the bathroom? Yes  Shower chair or bench in shower? Yes  Elevated toilet seat or a handicapped toilet? Yes   TIMED UP AND GO:  Was the test performed? Yes .  Length of time to ambulate 10 feet: 18 sec.   Gait slow and steady with assistive device  Cognitive Function:    07/06/2022    1:26 PM  MMSE - Mini Mental State Exam  Orientation to time 3  Orientation to Place 4  Registration 3  Attention/ Calculation 1  Recall 3  Language- name 2 objects 2  Language- repeat 1  Language- follow 3 step command 3  Language- read & follow direction 1  Write a sentence 1  Copy design 0  Total score 22        Immunizations Immunization History  Administered Date(s) Administered   Fluad Quad(high Dose 65+) 03/29/2022   Influenza Split 03/16/2011, 03/01/2012   Influenza Whole 03/28/2007, 04/01/2009, 02/18/2010   Influenza, High Dose Seasonal PF 03/14/2013   Influenza,inj,Quad PF,6+ Mos 03/20/2014, 03/11/2015   Influenza-Unspecified 08/06/2018, 03/20/2020   Moderna SARS-COV2 Booster Vaccination 11/04/2020   Moderna Sars-Covid-2 Vaccination 06/11/2019, 07/09/2019, 02/24/2021   PPD Test 04/23/2020, 11/03/2021   Pfizer Covid-19 Vaccine Bivalent Booster 74yr & up 04/08/2022   Pneumococcal Conjugate-13 02/25/2015   Pneumococcal Polysaccharide-23 03/28/2007  Td 12/20/2005    TDAP status: Due, Education has been provided regarding the importance of this vaccine. Advised may receive this vaccine at local pharmacy or Health Dept. Aware to provide a copy of the vaccination record if obtained from local pharmacy or Health Dept. Verbalized acceptance and understanding.  Flu Vaccine status: Up to date  Pneumococcal  vaccine status: Up to date  Covid-19 vaccine status: Information provided on how to obtain vaccines.   Qualifies for Shingles Vaccine? Yes   Zostavax completed Yes   Shingrix Completed?: No.    Education has been provided regarding the importance of this vaccine. Patient has been advised to call insurance company to determine out of pocket expense if they have not yet received this vaccine. Advised may also receive vaccine at local pharmacy or Health Dept. Verbalized acceptance and understanding.  Screening Tests Health Maintenance  Topic Date Due   Zoster Vaccines- Shingrix (1 of 2) Never done   MAMMOGRAM  09/15/2013   DTaP/Tdap/Td (2 - Tdap) 12/21/2015   COVID-19 Vaccine (6 - 2023-24 season) 09/07/2022 (Originally 06/03/2022)   Medicare Annual Wellness (AWV)  07/07/2023   Pneumonia Vaccine 67+ Years old  Completed   INFLUENZA VACCINE  Completed   DEXA SCAN  Completed   HPV VACCINES  Aged Out    Health Maintenance  Health Maintenance Due  Topic Date Due   Zoster Vaccines- Shingrix (1 of 2) Never done   MAMMOGRAM  09/15/2013   DTaP/Tdap/Td (2 - Tdap) 12/21/2015    Colorectal cancer screening: No longer required.   Mammogram status: No longer required due to aged out.  Bone Density status: Ordered 07/06/22. Pt provided with contact info and advised to call to schedule appt.  Lung Cancer Screening: (Low Dose CT Chest recommended if Age 90-80 years, 30 pack-year currently smoking OR have quit w/in 15years.) does not qualify.    Additional Screening:  Hepatitis C Screening: does not qualify  Vision Screening: Recommended annual ophthalmology exams for early detection of glaucoma and other disorders of the eye. Is the patient up to date with their annual eye exam?  No  Who is the provider or what is the name of the office in which the patient attends annual eye exams? HPOA will provide If pt is not established with a provider, would they like to be referred to a provider to  establish care? No .   Dental Screening: Recommended annual dental exams for proper oral hygiene  Community Resource Referral / Chronic Care Management: CRR required this visit?  No   CCM required this visit?  No      Plan:    Due eye exam, DEXA, Shingrix if HPOA desires.  I have personally reviewed and noted the following in the patient's chart:   Medical and social history Use of alcohol, tobacco or illicit drugs  Current medications and supplements including opioid prescriptions. Patient is not currently taking opioid prescriptions. Functional ability and status Nutritional status Physical activity Advanced directives List of other physicians Hospitalizations, surgeries, and ER visits in previous 12 months Vitals Screenings to include cognitive, depression, and falls Referrals and appointments  In addition, I have reviewed and discussed with patient certain preventive protocols, quality metrics, and best practice recommendations. A written personalized care plan for preventive services as well as general preventive health recommendations were provided to patient.     Alyx Mcguirk X Eara Burruel, NP   07/06/2022

## 2022-07-08 NOTE — Progress Notes (Signed)
This encounter was created in error - please disregard.

## 2022-07-19 DIAGNOSIS — R319 Hematuria, unspecified: Secondary | ICD-10-CM | POA: Diagnosis not present

## 2022-07-20 ENCOUNTER — Encounter: Payer: Self-pay | Admitting: Nurse Practitioner

## 2022-07-20 ENCOUNTER — Non-Acute Institutional Stay: Payer: Medicare Other | Admitting: Nurse Practitioner

## 2022-07-20 DIAGNOSIS — I872 Venous insufficiency (chronic) (peripheral): Secondary | ICD-10-CM

## 2022-07-20 DIAGNOSIS — D519 Vitamin B12 deficiency anemia, unspecified: Secondary | ICD-10-CM

## 2022-07-20 DIAGNOSIS — R319 Hematuria, unspecified: Secondary | ICD-10-CM | POA: Diagnosis not present

## 2022-07-20 DIAGNOSIS — K219 Gastro-esophageal reflux disease without esophagitis: Secondary | ICD-10-CM

## 2022-07-20 DIAGNOSIS — I10 Essential (primary) hypertension: Secondary | ICD-10-CM

## 2022-07-20 DIAGNOSIS — F0390 Unspecified dementia without behavioral disturbance: Secondary | ICD-10-CM

## 2022-07-20 DIAGNOSIS — R310 Gross hematuria: Secondary | ICD-10-CM

## 2022-07-20 DIAGNOSIS — R627 Adult failure to thrive: Secondary | ICD-10-CM

## 2022-07-20 LAB — HEPATIC FUNCTION PANEL
ALT: 9 U/L (ref 7–35)
AST: 13 (ref 13–35)
Alkaline Phosphatase: 73 (ref 25–125)
Bilirubin, Total: 0.6

## 2022-07-20 LAB — COMPREHENSIVE METABOLIC PANEL
Albumin: 3.4 — AB (ref 3.5–5.0)
Calcium: 9 (ref 8.7–10.7)
Globulin: 2.1
eGFR: 42

## 2022-07-20 LAB — BASIC METABOLIC PANEL
BUN: 22 — AB (ref 4–21)
CO2: 28 — AB (ref 13–22)
Chloride: 105 (ref 99–108)
Creatinine: 1.2 — AB (ref 0.5–1.1)
Glucose: 88
Potassium: 4.3 mEq/L (ref 3.5–5.1)
Sodium: 140 (ref 137–147)

## 2022-07-20 LAB — CBC AND DIFFERENTIAL
HCT: 37 (ref 36–46)
Hemoglobin: 12.7 (ref 12.0–16.0)
Neutrophils Absolute: 3704
Platelets: 233 10*3/uL (ref 150–400)
WBC: 7.7

## 2022-07-20 LAB — CBC: RBC: 3.81 — AB (ref 3.87–5.11)

## 2022-07-20 NOTE — Progress Notes (Signed)
Location:   [facility] Room Number: [place] of Service:  AL[facility] (13) Provider: Lennie Odor Cynthia Stainback NP  [name], MD  Patient Care Team: [name], MD as PCP - General (Internal Medicine) [name], MD as Consulting Physician (Cardiology)  Extended Emergency Contact Information Primary Emergency Contact: Osborne Casco Address: [address]          [place], [place] [address] [place] Phone: [phone] Mobile Phone: [phone] Relation: Niece  Code Status: DNR Goals of care: Advanced Directive information    07/20/2022    2:31 PM  Advanced Directives  Does Patient Have a Medical Advance Directive? Yes  Type of Paramedic of [place];Out of facility DNR (pink MOST or yellow form)  Does patient want to make changes to medical advance directive? No - Patient declined  Copy of [place] in Chart? Yes - validated most recent copy scanned in chart (See row information)     Chief Complaint  Patient presents with   Acute Visit    Blood in urine.    HPI:  Pt is a 89 y.o. female seen today for an acute visit for reported blood in urine 07/18/22, denied urinary frequency, urgency, or dysuria associated with the presentation. Denied abd pain or generalized malaise, she is afebrile.     weigh gained about #6Ibs in the past 3 months, BMI 36.56, off  Mirtazapine 07/02/22 09/12/21 choledocholithiasis/ascending cholangitis s/p ERCP and stone removal. Severe sepsis/E coli bacteremia- fully treated. AST/ALT 17/9 11/10/21             Chronic cough, on Benzonatate              CKD, stage 3, Bun/creat 20/1.4 11/10/21             GERD, better indigestion, burping, on Omeprazole. prn Maalox             Edema BLE, off Furosemide, not new,  EF 50-55% 06/01/2017.             HTN, takes Amlodipine             Hyperlipidemia, LDL 77 06/18/21, off Rosuvastatin             Dementia, gradual decline, 07/06/22 MMSE  22/30, failed clock drawing. TSH 1.313 09/13/21             Anemia, Vit B12 228 04/03/20, added Vit B 216/22, off Fe, Hgb 12.1 09/18/21  Osteopenia,  DEXA 07/15/22 t score -2.275     Past Medical History:  Diagnosis Date   Arthritis    "right thumb" (01/29/2015)   Chronic shoulder pain    "between my shoulders" (01/29/2015)   Dizziness    Esophageal stricture 2009   Gallstones    GERD (gastroesophageal reflux disease) 2004   Hemorrhoids 2004   Hiatal hernia 2004   History of blood transfusion 1947   "appendix ruptured"   HOH (hard of hearing)    Hypertension    Rhinitis, allergic    Syncopal episodes    Past Surgical History:  Procedure Laterality Date   APPENDECTOMY  1947   "ruptured"   CARDIAC CATHETERIZATION  10/23/1999; 09/2014   EF 60% No significant CAD; patent coronary arteries.   CARPAL TUNNEL RELEASE Right 1990's   CHOLECYSTECTOMY N/A 01/29/2015   Procedure: LAPAROSCOPIC CHOLECYSTECTOMY;  Surgeon: Rolm Bookbinder, MD;  Location: [facility];  Service: General;  Laterality: N/A;   DILATION AND CURETTAGE  OF UTERUS  "couple"   ERCP N/A 09/13/2021   Procedure: ENDOSCOPIC RETROGRADE CHOLANGIOPANCREATOGRAPHY (ERCP);  Surgeon: Carol Ada, MD;  Location: [facility];  Service: Gastroenterology;  Laterality: N/A;   ESOPHAGOGASTRODUODENOSCOPY (EGD) WITH ESOPHAGEAL DILATION  2-3 times   HEMORROIDECTOMY     KNEE ARTHROSCOPY Bilateral    right x 2   LAPAROSCOPIC CHOLECYSTECTOMY  01/29/2015   LEFT HEART CATHETERIZATION WITH CORONARY ANGIOGRAM N/A 09/16/2014   Procedure: LEFT HEART CATHETERIZATION WITH CORONARY ANGIOGRAM;  Surgeon: [name], MD;  Location: [facility];  Service: Cardiovascular;  Laterality: N/A;   NM MYOVIEW LTD  06/01/2012   EF 71%   REMOVAL OF STONES  09/13/2021   Procedure: REMOVAL OF STONES;  Surgeon: Carol Ada, MD;  Location: [facility];  Service: Gastroenterology;;   [name]  09/13/2021   Procedure: [name];  Surgeon: Carol Ada, MD;   Location: [facility];  Service: Gastroenterology;;   VAGINAL HYSTERECTOMY  1970's    Allergies  Allergen Reactions   Contrast Media [Iodinated Contrast Media] Itching and Rash    Pt covered in rash, red.   Codeine Nausea And Vomiting    in large doses: can tolerate Tussionex    Allergies as of 07/20/2022       Reactions   Contrast Media [iodinated Contrast Media] Itching, Rash   Pt covered in rash, red.   Codeine Nausea And Vomiting   in large doses: can tolerate Tussionex        Medication List        Accurate as of July 20, 2022  4:40 PM. If you have any questions, ask your nurse or doctor.          acetaminophen 325 MG tablet Commonly known as: TYLENOL Take 650 mg by mouth every 6 (six) hours as needed for mild pain.   alum & mag hydroxide-simeth [id number] MG/5ML suspension Commonly known as: MAALOX PLUS Take 10 mLs by mouth every 6 (six) hours as needed for indigestion or heartburn.   amLODipine 5 MG tablet Commonly known as: NORVASC Take 1 tablet (5 mg total) by mouth daily.   benzonatate 100 MG capsule Commonly known as: TESSALON Take 1 capsule by mouth 3 (three) times daily.   cyanocobalamin 1000 MCG tablet Commonly known as: VITAMIN B12 Take 1,000 mcg by mouth daily.   lidocaine 4 % Place 1 patch onto the skin every 12 (twelve) hours as needed (back pain). Remove & Discard patch within 12 hours or as directed by MD   omeprazole 40 MG capsule Commonly known as: PRILOSEC Take 40 mg by mouth daily.   ondansetron 4 MG disintegrating tablet Commonly known as: ZOFRAN-ODT Take 4 mg by mouth every 6 (six) hours as needed for nausea or vomiting.   Resource 2.0 Liqd Take by mouth. 180 ml; oral Twice A Day Special Instructions: administer with med pass        Review of Systems  Constitutional:  Negative for appetite change, fatigue and fever.  HENT:  Positive for hearing loss. Negative for congestion and trouble swallowing.   Respiratory:   Positive for cough. Negative for chest tightness and shortness of breath.   Cardiovascular:  Positive for leg swelling.  Gastrointestinal:  Negative for abdominal pain and constipation.  Genitourinary:  Positive for frequency and hematuria. Negative for dysuria and urgency.       Baseline urinary frequency  Musculoskeletal:  Positive for back pain and gait problem.  Skin:  Negative for color change.  Neurological:  Negative for  speech difficulty, weakness and light-headedness.       Memory lapses.   Psychiatric/Behavioral:  Negative for behavioral problems and sleep disturbance. The patient is not nervous/anxious.     Immunization History  Administered Date(s) Administered   Fluad Quad(high Dose 65+) 03/29/2022   Influenza Split 03/16/2011, 03/01/2012   Influenza Whole 03/28/2007, 04/01/2009, 02/18/2010   Influenza, High Dose Seasonal PF 03/14/2013   Influenza,inj,Quad PF,6+ Mos 03/20/2014, 03/11/2015   Influenza-Unspecified 08/06/2018, 03/20/2020   Moderna SARS-COV2 Booster Vaccination 11/04/2020   Moderna Sars-Covid-2 Vaccination 06/11/2019, 07/09/2019, 02/24/2021   PPD Test 04/23/2020, 11/03/2021   Pfizer Covid-19 Vaccine Bivalent Booster 77yr & up 04/08/2022   Pneumococcal Conjugate-13 02/25/2015   Pneumococcal Polysaccharide-23 03/28/2007   Td 12/20/2005   Pertinent  Health Maintenance Due  Topic Date Due   MAMMOGRAM  09/15/2013   INFLUENZA VACCINE  Completed   DEXA SCAN  Completed      09/15/2021    3:00 PM 09/15/2021    8:00 PM 09/16/2021   10:06 AM 07/02/2022    9:42 AM 07/06/2022    1:44 PM  Fall Risk  Falls in the past year?    0 0  Was there an injury with Fall?    0 0  Fall Risk Category Calculator    0 0  (RETIRED) Patient Fall Risk Level High fall risk High fall risk High fall risk    Patient at Risk for Falls Due to    No Fall Risks No Fall Risks  Fall risk Follow up    Falls evaluation completed Falls evaluation completed   Functional Status Survey:     Vitals:   07/20/22 1420  BP: (!) 144/66  Pulse: 77  Resp: 16  Temp: 97.6 F (36.4 C)  SpO2: 94%  Weight: 192 lb 4 oz (87.2 kg)  Height: 5' (1.524 m)   Body mass index is 37.55 kg/m. Physical Exam Constitutional:      Appearance: Normal appearance.  HENT:     Head: Normocephalic and atraumatic.     Mouth/Throat:     Mouth: Mucous membranes are moist.  Eyes:     Extraocular Movements: Extraocular movements intact.     Conjunctiva/sclera: Conjunctivae normal.     Pupils: Pupils are equal, round, and reactive to light.  Cardiovascular:     Rate and Rhythm: Normal rate and regular rhythm.     Heart sounds: No murmur heard. Pulmonary:     Effort: Pulmonary effort is normal.     Breath sounds: No rales.  Abdominal:     General: Bowel sounds are normal.     Palpations: Abdomen is soft.     Tenderness: There is no abdominal tenderness. There is no right CVA tenderness, left CVA tenderness, guarding or rebound.  Musculoskeletal:     Cervical back: Normal range of motion and neck supple.     Right lower leg: Edema present.     Left lower leg: Edema present.     Comments: BLE chronic venous insufficiency skin changes. Trace edema BLE.   Skin:    General: Skin is warm and dry.  Neurological:     General: No focal deficit present.     Mental Status: She is alert. Mental status is at baseline.     Gait: Gait abnormal.     Comments: Oriented to person, her room on unit.   Psychiatric:        Mood and Affect: Mood normal.        Behavior: Behavior  normal.     Labs reviewed: Recent Labs    09/12/21 2108 09/13/21 0443 09/14/21 0908 09/15/21 0529 09/16/21 0500 09/18/21 0000 10/05/21 0000 11/10/21 0000 07/20/22 0655  NA 138 139 137 135 142   < > 140 141 140  K 3.2* 4.2 4.3 3.9 3.5   < > 4.6 4.1 4.3  CL 101 103 110 112* 117*   < > 107 107 105  CO2 23 25 17* 17* 19*   < > 21 27* 28*  GLUCOSE 106* 108* 161* 108* 81  --   --   --   --   BUN 24* 24* 33* 30* 28*   < >  23* 20 22*  CREATININE 2.20* 2.10* 2.54* 2.21* 1.89*   < > 2.2* 1.4* 1.2*  CALCIUM 9.2 8.6* 7.6* 7.6* 8.3*   < > 9.6 8.4* 9.0  MG 2.5* 2.3  --   --   --   --   --   --   --   PHOS  --   --   --   --  3.0  --   --   --   --    < > = values in this interval not displayed.   Recent Labs    09/13/21 0443 09/14/21 0908 09/15/21 0529 09/16/21 0500 09/18/21 0000 11/10/21 0000 07/20/22 0655  AST 157* 69* 41  --  24 17 13  $ ALT 78* 58* 41  --  20 9 9  $ ALKPHOS 838* 672* 551*  --  452* 69 73  BILITOT 5.1* 1.5* 1.1  --   --   --   --   PROT 5.6* 5.5* 5.3*  --   --   --   --   ALBUMIN 2.7* 2.6* 2.5*   < > 2.8* 2.8* 3.4*   < > = values in this interval not displayed.   Recent Labs    09/14/21 0908 09/15/21 0529 09/16/21 0500 09/18/21 0000 07/20/22 0655  WBC 15.1* 16.6* 9.8 3.6 7.7  NEUTROABS 13.7* 13.9* 5.9 4,398.00 3,704.00  HGB 13.0 12.1 11.5* 12.1 12.7  HCT 40.8 36.2 35.6* 36 37  MCV 103.0* 99.5 101.4*  --   --   PLT 225 208 205 209 233   Lab Results  Component Value Date   TSH 1.313 09/13/2021   Lab Results  Component Value Date   HGBA1C 5.8 07/11/2017   Lab Results  Component Value Date   CHOL 136 06/18/2021   HDL 34 (A) 06/18/2021   LDLCALC 77 06/18/2021   LDLDIRECT 161.2 07/18/2013   TRIG 151 06/18/2021   CHOLHDL 2.9 04/03/2020    Significant Diagnostic Results in last 30 days:  No results found.  Assessment/Plan: OSTEOPENIA DEXA 07/15/22 t score -2.275 Suggest Vit D 2000 u Cal 861m qd.   Vitamin B12 deficiency anemia  Vit B12 228 04/03/20, added Vit B 216/22, off Fe, Hgb 12.1 09/18/21  Hematuria reported blood in urine 07/18/22, denied urinary frequency, urgency, or dysuria associated with the presentation. Denied abd pain or generalized malaise, she is afebrile. Pending UA C/S, CBC/diff, CMP/eGFR.  07/20/22 wbc 7.7, Hgb 12.7, plt 233, neutrophils 48.1, Na 140, K 4.3, Bun 22, creat 1.21  Adult failure to thrive  weigh gained about #6Ibs in the past 3  months, BMI 36.56, off  Mirtazapine 07/02/22  GERD  better indigestion, burping, on Omeprazole. prn Maalox  Edema of both lower extremities due to peripheral venous insufficiency  Edema BLE, off Furosemide, not new,  EF 50-55% 06/01/2017.  Hypertension Blood pressure is controlled,  takes Amlodipine  Senile dementia ([place])  gradual decline, 07/06/22 MMSE 22/30, failed clock drawing. TSH 1.313 09/13/21    Family/ staff Communication: plan of care reviewed with the patient and charge nurse.   Labs/tests ordered:  pending CBC/diff, CMP/eGFR, UA C/S  Time spend 40 minutes.

## 2022-07-20 NOTE — Assessment & Plan Note (Signed)
DEXA 07/15/22 t score -2.275 Suggest Vit D 2000 u Cal 847m qd.

## 2022-07-20 NOTE — Assessment & Plan Note (Signed)
Vit B12 228 04/03/20, added Vit B 216/22, off Fe, Hgb 12.1 09/18/21

## 2022-07-20 NOTE — Assessment & Plan Note (Addendum)
reported blood in urine 07/18/22, denied urinary frequency, urgency, or dysuria associated with the presentation. Denied abd pain or generalized malaise, she is afebrile. Pending UA C/S, CBC/diff, CMP/eGFR.  07/20/22 wbc 7.7, Hgb 12.7, plt 233, neutrophils 48.1, Na 140, K 4.3, Bun 22, creat 1.21

## 2022-07-20 NOTE — Assessment & Plan Note (Signed)
gradual decline, 07/06/22 MMSE 22/30, failed clock drawing. TSH 1.313 09/13/21

## 2022-07-20 NOTE — Assessment & Plan Note (Signed)
weigh gained about #6Ibs in the past 3 months, BMI 36.56, off  Mirtazapine 07/02/22

## 2022-07-20 NOTE — Assessment & Plan Note (Signed)
better indigestion, burping, on Omeprazole. prn Maalox

## 2022-07-20 NOTE — Assessment & Plan Note (Signed)
Edema BLE, off Furosemide, not new,  EF 50-55% 06/01/2017.

## 2022-07-20 NOTE — Assessment & Plan Note (Signed)
Blood pressure is controlled,  takes Amlodipine

## 2022-11-02 ENCOUNTER — Non-Acute Institutional Stay (SKILLED_NURSING_FACILITY): Payer: Medicare Other | Admitting: Family Medicine

## 2022-11-02 DIAGNOSIS — R413 Other amnesia: Secondary | ICD-10-CM

## 2022-11-02 DIAGNOSIS — R627 Adult failure to thrive: Secondary | ICD-10-CM

## 2022-11-02 DIAGNOSIS — I10 Essential (primary) hypertension: Secondary | ICD-10-CM

## 2022-11-02 DIAGNOSIS — D509 Iron deficiency anemia, unspecified: Secondary | ICD-10-CM | POA: Diagnosis not present

## 2022-11-02 DIAGNOSIS — K8050 Calculus of bile duct without cholangitis or cholecystitis without obstruction: Secondary | ICD-10-CM

## 2022-11-02 NOTE — Progress Notes (Signed)
Provider:  Jacalyn Lefevre, MD Location:      Place of Service:     PCP: Mahlon Gammon, MD Patient Care Team: Mahlon Gammon, MD as PCP - General (Internal Medicine) Runell Gess, MD as Consulting Physician (Cardiology)  Extended Emergency Contact Information Primary Emergency Contact: Elita Quick Address: [address]          [place], [place] [address] [place] Home Phone: [phone] Mobile Phone: [phone] Relation: Niece  Code Status:  Goals of Care: Advanced Directive information    07/20/2022    2:31 PM  Advanced Directives  Does Patient Have a Medical Advance Directive? Yes  Type of Estate agent of [place];Out of facility DNR (pink MOST or yellow form)  Does patient want to make changes to medical advance directive? No - Patient declined  Copy of Healthcare Power of Attorney in Chart? Yes - validated most recent copy scanned in chart (See row information)      No chief complaint on file.   HPI: Patient is a 89 y.o. female seen today for medical management of chronic problems including hypertension, adult failure to thrive, iron deficiency anemia, choledocholithiasis, and memory loss. Visited patient in her room we had a nice conversation.  She informed me however that she is on no medications but her medical record differs because she is on amlodipine, acetaminophen, and omeprazole. She has no complaints today by history, she walks regularly around the facility with her walker.  There have been no recent falls. Endorses good appetite and sleep.  There was a history of failure to thrive but she has not lost any weight recently   Past Medical History:  Diagnosis Date   Arthritis    "right thumb" (01/29/2015)   Chronic shoulder pain    "between my shoulders" (01/29/2015)   Dizziness    Esophageal stricture 2009   Gallstones    GERD (gastroesophageal reflux disease) 2004   Hemorrhoids 2004   Hiatal hernia  2004   History of blood transfusion 1947   "appendix ruptured"   HOH (hard of hearing)    Hypertension    Rhinitis, allergic    Syncopal episodes    Past Surgical History:  Procedure Laterality Date   APPENDECTOMY  1947   "ruptured"   CARDIAC CATHETERIZATION  10/23/1999; 09/2014   EF 60% No significant CAD; patent coronary arteries.   CARPAL TUNNEL RELEASE Right 1990's   CHOLECYSTECTOMY N/A 01/29/2015   Procedure: LAPAROSCOPIC CHOLECYSTECTOMY;  Surgeon: Emelia Loron, MD;  Location: [facility] OR;  Service: General;  Laterality: N/A;   DILATION AND CURETTAGE OF UTERUS  "couple"   ERCP N/A 09/13/2021   Procedure: ENDOSCOPIC RETROGRADE CHOLANGIOPANCREATOGRAPHY (ERCP);  Surgeon: Jeani Hawking, MD;  Location: [facility];  Service: Gastroenterology;  Laterality: N/A;   ESOPHAGOGASTRODUODENOSCOPY (EGD) WITH ESOPHAGEAL DILATION  2-3 times   HEMORROIDECTOMY     KNEE ARTHROSCOPY Bilateral    right x 2   LAPAROSCOPIC CHOLECYSTECTOMY  01/29/2015   LEFT HEART CATHETERIZATION WITH CORONARY ANGIOGRAM N/A 09/16/2014   Procedure: LEFT HEART CATHETERIZATION WITH CORONARY ANGIOGRAM;  Surgeon: Runell Gess, MD;  Location: [facility] CATH LAB;  Service: Cardiovascular;  Laterality: N/A;   NM MYOVIEW LTD  06/01/2012   EF 71%   REMOVAL OF STONES  09/13/2021   Procedure: REMOVAL OF STONES;  Surgeon: Jeani Hawking, MD;  Location: [facility];  Service: Gastroenterology;;   Dennison Mascot  09/13/2021   Procedure: Dennison Mascot;  Surgeon: Jeani Hawking, MD;  Location: [facility]OS[facility];  Service: Gastroenterology;;   VAGINAL HYSTERECTOMY  1970's    reports that she has never smoked. She has never used smokeless tobacco. She reports that she does not drink alcohol and does not use drugs. Social History   Socioeconomic History   Marital status: Widowed    Spouse name: Not on file   Number of children: 0   Years of education: Not on file   Highest education level: Not on file  Occupational History   Occupation: retired     Associate Professor: RETIRED    Comment: school cafeteria mgr  Tobacco Use   Smoking status: Never   Smokeless tobacco: Never  Vaping Use   Vaping Use: Never used  Substance and Sexual Activity   Alcohol use: No    Alcohol/week: 0.0 standard drinks of alcohol   Drug use: No   Sexual activity: Never  Other Topics Concern   Not on file  Social History Narrative   Patient does not consume alcohol or use tobacco products.   She does drink/eat things with caffeine in it.   Marital status: widow (married in [place])   Patient lives in an assisted living 1 story apartment home   Highest level of education completed was high school.   Past profession: home maker   Exercise: walk 3 x week   Patient has a POA and living   Social Determinants of Health   Financial Resource Strain: Not on file  Food Insecurity: Not on file  Transportation Needs: Not on file  Physical Activity: Not on file  Stress: Not on file  Social Connections: Not on file  Intimate Partner Violence: Not on file    Functional Status Survey:    Family History  Problem Relation Age of Onset   Stroke Father 37       light stroke   Heart disease Sister    COPD Sister    Heart disease Brother    Coronary artery disease Neg Hx    Diabetes Neg Hx    Cancer Neg Hx        breast, colon, prostate    Health Maintenance  Topic Date Due   Zoster Vaccines- Shingrix (1 of 2) Never done   MAMMOGRAM  09/15/2013   DTaP/Tdap/Td (2 - Tdap) 12/21/2015   COVID-19 Vaccine (6 - 2023-24 season) 06/03/2022   INFLUENZA VACCINE  01/06/2023   Medicare Annual Wellness (AWV)  07/07/2023   Pneumonia Vaccine 36+ Years old  Completed   DEXA SCAN  Completed   HPV VACCINES  Aged Out    Allergies  Allergen Reactions   Contrast Media [Iodinated Contrast Media] Itching and Rash    Pt covered in rash, red.   Codeine Nausea And Vomiting    in large doses: can tolerate Tussionex    Outpatient Encounter Medications as of 11/02/2022   Medication Sig   acetaminophen (TYLENOL) 325 MG tablet Take 650 mg by mouth every 6 (six) hours as needed for mild pain.   alum & mag hydroxide-simeth (MAALOX PLUS) 400-400-40 MG/5ML suspension Take 10 mLs by mouth every 6 (six) hours as needed for indigestion or heartburn.   amLODipine (NORVASC) 5 MG tablet Take 1 tablet (5 mg total) by mouth daily.   benzonatate (TESSALON) 100 MG capsule Take 1 capsule by mouth 3 (three) times daily.   lidocaine 4 % Place 1 patch onto the skin every 12 (twelve) hours as needed (back pain). Remove & Discard patch within 12 hours or as directed by MD  Nutritional Supplements (RESOURCE 2.0) LIQD Take by mouth. 180 ml; oral Twice A Day Special Instructions: administer with med pass   omeprazole (PRILOSEC) 40 MG capsule Take 40 mg by mouth daily.   ondansetron (ZOFRAN-ODT) 4 MG disintegrating tablet Take 4 mg by mouth every 6 (six) hours as needed for nausea or vomiting.   vitamin B-12 (CYANOCOBALAMIN) 1000 MCG tablet Take 1,000 mcg by mouth daily.   No facility-administered encounter medications on file as of 11/02/2022.    Review of Systems  Constitutional: Negative.   HENT: Negative.    Respiratory: Negative.    Cardiovascular: Negative.   Gastrointestinal: Negative.   Genitourinary: Negative.   Psychiatric/Behavioral: Negative.      There were no vitals filed for this visit. There is no height or weight on file to calculate BMI. Physical Exam Vitals and nursing note reviewed.  Constitutional:      Appearance: Normal appearance.  Cardiovascular:     Rate and Rhythm: Normal rate and regular rhythm.  Pulmonary:     Effort: Pulmonary effort is normal.     Breath sounds: Normal breath sounds.  Neurological:     General: No focal deficit present.     Mental Status: She is alert.  Psychiatric:        Mood and Affect: Mood normal.        Behavior: Behavior normal.     Labs reviewed: Basic Metabolic Panel: Recent Labs    11/10/21 0000  07/20/22 0655  NA 141 140  K 4.1 4.3  CL 107 105  CO2 27* 28*  BUN 20 22*  CREATININE 1.4* 1.2*  CALCIUM 8.4* 9.0   Liver Function Tests: Recent Labs    11/10/21 0000 07/20/22 0655  AST 17 13  ALT 9 9  ALKPHOS 69 73  ALBUMIN 2.8* 3.4*   No results for input(s): "LIPASE", "AMYLASE" in the last 8760 hours. No results for input(s): "AMMONIA" in the last 8760 hours. CBC: Recent Labs    07/20/22 0655  WBC 7.7  NEUTROABS 3,704.00  HGB 12.7  HCT 37  PLT 233   Cardiac Enzymes: No results for input(s): "CKTOTAL", "CKMB", "CKMBINDEX", "TROPONINI" in the last 8760 hours. BNP: Invalid input(s): "POCBNP" Lab Results  Component Value Date   HGBA1C 5.8 07/11/2017   Lab Results  Component Value Date   TSH 1.313 09/13/2021   Lab Results  Component Value Date   VITAMINB12 228 04/03/2020   Lab Results  Component Value Date   FOLATE 8.4 04/13/2010   Lab Results  Component Value Date   IRON 23 08/27/2020   TIBC 368 08/27/2020   FERRITIN 6 08/27/2020    Imaging and Procedures obtained prior to SNF admission: DG ERCP  Result Date: 09/14/2021 CLINICAL DATA:  Choledocholithiasis EXAM: ERCP TECHNIQUE: Multiple spot images obtained with the fluoroscopic device and submitted for interpretation post-procedure. FLUOROSCOPY: Radiation Exposure Index (as provided by the fluoroscopic device): 67.53 mGy Kerma COMPARISON:  CT abdomen/pelvis 09/12/2021 FINDINGS: Total of 6 intraoperative saved images are submitted for review. The images demonstrate a flexible duodenal scope in the descending duodenum with wire cannulation of the common bile duct. Subsequently, balloon occluded cholangiogram is performed demonstrating biliary ductal dilation. Filling defects in the distal common bile duct suggest choledocholithiasis. Subsequent images document sphincterotomy and balloon sweep of the common duct. IMPRESSION: 1. Choledocholithiasis. 2. ERCP with sphincterotomy and balloon sweeping of the  common duct. These images were submitted for radiologic interpretation only. Please see the procedural report for the amount of  contrast and the fluoroscopy time utilized. Electronically Signed   By: Malachy Moan M.D.   On: 09/14/2021 08:03   CT Abdomen Pelvis Wo Contrast  Result Date: 09/12/2021 CLINICAL DATA:  Nausea/vomiting. unwitnessed fall at facility. Was found face down with vomit around her. EXAM: CT ABDOMEN AND PELVIS WITHOUT CONTRAST TECHNIQUE: Multidetector CT imaging of the abdomen and pelvis was performed following the standard protocol without IV contrast. RADIATION DOSE REDUCTION: This exam was performed according to the departmental dose-optimization program which includes automated exposure control, adjustment of the mA and/or kV according to patient size and/or use of iterative reconstruction technique. COMPARISON:  None. FINDINGS: Slightly limited evaluation due to motion artifact. Lower chest: No acute abnormality.  Tiny hiatal hernia. Liver: Mildly enlarged measuring up to 18 cm.  No focal lesion. Biliary System: Status post cholecystectomy. Enlarged common bile duct measuring up to 1.4 cm with associated intraluminal hyperdensities measuring approximately and 0.70.8 cm. Pancreas: Diffusely atrophic. No focal lesion. Query slight fat stranding along the proximal pancreas (2:31). No main pancreatic ductal dilatation. Spleen: Not enlarged. No focal lesion. Adrenal Glands: No nodularity bilaterally. Kidneys: No hydroureteronephrosis. No nephroureterolithiasis. No contour deforming renal mass. Anterior urinary bladder diverticula. Otherwise the urinary bladder is unremarkable. Bowel: No small or large bowel wall thickening or dilatation. Status post appendectomy. Mesentery, Omentum, and Peritoneum: No simple free fluid ascites. No pneumoperitoneum. No mesenteric hematoma identified. No organized fluid collection. Pelvic Organs: Status post hysterectomy. Bilateral adnexal regions are  unremarkable. Lymph Nodes: No abdominal, pelvic, inguinal lymphadenopathy. Vasculature: Severe atherosclerotic plaque. No abdominal aorta or iliac aneurysm. Musculoskeletal: No significant soft tissue hematoma. Small to moderate umbilical hernia containing fat with abdominal wall defect of 3.3 cm. No acute pelvic fracture. No spinal fracture. Multilevel degenerative changes spine. IMPRESSION: 1. Findings suggestive of choledocholithiasis in a patient status post cholecystectomy. Correlate with liver function tests. 2. Query slight fat stranding along the proximal pancreas with limited evaluation due to motion artifact. Correlate with lipase levels. 3. No acute traumatic injury to the abdomen or pelvis on this noncontrast study. 4. No acute fracture or traumatic malalignment of the lumbar spine. Other imaging findings of potential clinical significance: 1. Small hiatal hernia. 2. Fat containing umbilical hernia. A findings suggestive ischemia or bowel obstruction. 3. Aortic Atherosclerosis (ICD10-I70.0). 4. Status post cholecystectomy, appendectomy, hysterectomy. Electronically Signed   By: Tish Frederickson M.D.   On: 09/12/2021 23:37   CT Head Wo Contrast  Result Date: 09/12/2021 CLINICAL DATA:  Head trauma, minor (Age >= 65y); Polytrauma, blunt. Unwitnessed fall EXAM: CT HEAD WITHOUT CONTRAST CT CERVICAL SPINE WITHOUT CONTRAST TECHNIQUE: Multidetector CT imaging of the head and cervical spine was performed following the standard protocol without intravenous contrast. Multiplanar CT image reconstructions of the cervical spine were also generated. RADIATION DOSE REDUCTION: This exam was performed according to the departmental dose-optimization program which includes automated exposure control, adjustment of the mA and/or kV according to patient size and/or use of iterative reconstruction technique. COMPARISON:  None. FINDINGS: CT HEAD FINDINGS BRAIN: BRAIN Cerebral ventricle sizes are concordant with the degree of  cerebral volume loss. Patchy and confluent areas of decreased attenuation are noted throughout the deep and periventricular white matter of the cerebral hemispheres bilaterally, compatible with chronic microvascular ischemic disease. No evidence of large-territorial acute infarction. No parenchymal hemorrhage. No mass lesion. No extra-axial collection. No mass effect or midline shift. No hydrocephalus. Basilar cisterns are patent. Vascular: No hyperdense vessel. Atherosclerotic calcifications are present within the cavernous internal carotid arteries. Skull:  No acute fracture or focal lesion. Sinuses/Orbits: Trace mucosal thickening of the right maxillary sinus. Otherwise paranasal sinuses and mastoid air cells are clear. Bilateral lens replacement. Otherwise the orbits are unremarkable. Other: None. CT CERVICAL SPINE FINDINGS Alignment: Normal. Skull base and vertebrae: Multilevel moderate degenerative changes spine. Severe osseous neural foraminal stenosis at the C4-C5 level. No acute fracture. No aggressive appearing focal osseous lesion or focal pathologic process. Soft tissues and spinal canal: No prevertebral fluid or swelling. No visible canal hematoma. Upper chest: Unremarkable. Other: None. IMPRESSION: 1. No acute intracranial abnormality. 2. No acute displaced fracture or traumatic listhesis of the cervical spine. Electronically Signed   By: Tish Frederickson M.D.   On: 09/12/2021 23:25   CT Cervical Spine Wo Contrast  Result Date: 09/12/2021 CLINICAL DATA:  Head trauma, minor (Age >= 65y); Polytrauma, blunt. Unwitnessed fall EXAM: CT HEAD WITHOUT CONTRAST CT CERVICAL SPINE WITHOUT CONTRAST TECHNIQUE: Multidetector CT imaging of the head and cervical spine was performed following the standard protocol without intravenous contrast. Multiplanar CT image reconstructions of the cervical spine were also generated. RADIATION DOSE REDUCTION: This exam was performed according to the departmental dose-optimization  program which includes automated exposure control, adjustment of the mA and/or kV according to patient size and/or use of iterative reconstruction technique. COMPARISON:  None. FINDINGS: CT HEAD FINDINGS BRAIN: BRAIN Cerebral ventricle sizes are concordant with the degree of cerebral volume loss. Patchy and confluent areas of decreased attenuation are noted throughout the deep and periventricular white matter of the cerebral hemispheres bilaterally, compatible with chronic microvascular ischemic disease. No evidence of large-territorial acute infarction. No parenchymal hemorrhage. No mass lesion. No extra-axial collection. No mass effect or midline shift. No hydrocephalus. Basilar cisterns are patent. Vascular: No hyperdense vessel. Atherosclerotic calcifications are present within the cavernous internal carotid arteries. Skull: No acute fracture or focal lesion. Sinuses/Orbits: Trace mucosal thickening of the right maxillary sinus. Otherwise paranasal sinuses and mastoid air cells are clear. Bilateral lens replacement. Otherwise the orbits are unremarkable. Other: None. CT CERVICAL SPINE FINDINGS Alignment: Normal. Skull base and vertebrae: Multilevel moderate degenerative changes spine. Severe osseous neural foraminal stenosis at the C4-C5 level. No acute fracture. No aggressive appearing focal osseous lesion or focal pathologic process. Soft tissues and spinal canal: No prevertebral fluid or swelling. No visible canal hematoma. Upper chest: Unremarkable. Other: None. IMPRESSION: 1. No acute intracranial abnormality. 2. No acute displaced fracture or traumatic listhesis of the cervical spine. Electronically Signed   By: Tish Frederickson M.D.   On: 09/12/2021 23:25   DG Chest Port 1 View  Result Date: 09/12/2021 CLINICAL DATA:  Fever unwitnessed fall EXAM: PORTABLE CHEST 1 VIEW COMPARISON:  05/19/2017 FINDINGS: The heart size and mediastinal contours are within normal limits. Aortic atherosclerosis. Both lungs  are clear. The visualized skeletal structures are unremarkable. IMPRESSION: No active disease. Electronically Signed   By: Jasmine Pang M.D.   On: 09/12/2021 22:15    Assessment/Plan 1. Adult failure to thrive Appetite is good is gained weight in recent months  2. Iron deficiency anemia, unspecified iron deficiency anemia type Treated with iron as well as B12.  Most recent hemoglobin 12.1  3. Choledocholithiasis Stone removed.  Developed sepsis.  Asymptomatic  4. Primary hypertension Blood pressure elevated today 172/81 on amlodipine 5 mg  5. Memory loss MMSE in 2022 was 23/30 (failed clock drawing)    Family/ staff Communication:   Labs/tests ordered:  .smmsig

## 2023-01-14 ENCOUNTER — Encounter: Payer: Self-pay | Admitting: Family Medicine

## 2023-01-17 ENCOUNTER — Encounter: Payer: Self-pay | Admitting: Family Medicine

## 2023-01-25 DIAGNOSIS — M2041 Other hammer toe(s) (acquired), right foot: Secondary | ICD-10-CM | POA: Diagnosis not present

## 2023-01-25 DIAGNOSIS — M2042 Other hammer toe(s) (acquired), left foot: Secondary | ICD-10-CM | POA: Diagnosis not present

## 2023-01-25 DIAGNOSIS — B351 Tinea unguium: Secondary | ICD-10-CM | POA: Diagnosis not present

## 2023-02-11 DIAGNOSIS — M546 Pain in thoracic spine: Secondary | ICD-10-CM | POA: Diagnosis not present

## 2023-02-11 DIAGNOSIS — W19XXXA Unspecified fall, initial encounter: Secondary | ICD-10-CM | POA: Diagnosis not present

## 2023-02-11 DIAGNOSIS — M4856XA Collapsed vertebra, not elsewhere classified, lumbar region, initial encounter for fracture: Secondary | ICD-10-CM | POA: Diagnosis not present

## 2023-02-14 ENCOUNTER — Encounter: Payer: Self-pay | Admitting: Nurse Practitioner

## 2023-02-14 ENCOUNTER — Non-Acute Institutional Stay: Payer: Medicare Other | Admitting: Nurse Practitioner

## 2023-02-14 DIAGNOSIS — K219 Gastro-esophageal reflux disease without esophagitis: Secondary | ICD-10-CM | POA: Diagnosis not present

## 2023-02-14 DIAGNOSIS — F0390 Unspecified dementia without behavioral disturbance: Secondary | ICD-10-CM | POA: Diagnosis not present

## 2023-02-14 DIAGNOSIS — N1831 Chronic kidney disease, stage 3a: Secondary | ICD-10-CM | POA: Diagnosis not present

## 2023-02-14 DIAGNOSIS — I10 Essential (primary) hypertension: Secondary | ICD-10-CM

## 2023-02-14 DIAGNOSIS — R053 Chronic cough: Secondary | ICD-10-CM | POA: Diagnosis not present

## 2023-02-14 DIAGNOSIS — R413 Other amnesia: Secondary | ICD-10-CM

## 2023-02-14 DIAGNOSIS — I872 Venous insufficiency (chronic) (peripheral): Secondary | ICD-10-CM | POA: Diagnosis not present

## 2023-02-14 DIAGNOSIS — M545 Low back pain, unspecified: Secondary | ICD-10-CM

## 2023-02-14 NOTE — Assessment & Plan Note (Signed)
gradual decline, 07/06/22 MMSE 22/30, failed clock drawing. TSH 1.313 09/13/21

## 2023-02-14 NOTE — Assessment & Plan Note (Signed)
off Furosemide, not new,  EF 50-55% 06/01/2017.  

## 2023-02-14 NOTE — Assessment & Plan Note (Signed)
on Benzonatate  

## 2023-02-14 NOTE — Assessment & Plan Note (Signed)
fell a few days ago, c/o lower back pain, able to move, ambulates with walker, no spinal spinous process tenderness, declined pain meds, ok with Voltaren topical. X-ray 02/11/23 thoracic, lumbar spine no compression deformities, mild chronic compression fx lf L1(10%)

## 2023-02-14 NOTE — Assessment & Plan Note (Signed)
better indigestion, burping, on Omeprazole. prn Maalox, Hgb 12.7 07/20/22

## 2023-02-14 NOTE — Progress Notes (Unsigned)
Location:   [facility] Room Number: 804 Place of Service:  ALF (13) Provider: Arna Snipe Marypat Kimmet NP  Mahlon Gammon, MD  Patient Care Team: Mahlon Gammon, MD as PCP - General (Internal Medicine) Runell Gess, MD as Consulting Physician (Cardiology)  Extended Emergency Contact Information Primary Emergency Contact: Elita Quick Address: [address]          [place], [place] [address] [place] Home Phone: [phone] Mobile Phone: [phone] Relation: Niece  Code Status: DNR Goals of care: Advanced Directive information    07/20/2022    2:31 PM  Advanced Directives  Does Patient Have a Medical Advance Directive? Yes  Type of Estate agent of [place];Out of facility DNR (pink MOST or yellow form)  Does patient want to make changes to medical advance directive? No - Patient declined  Copy of Healthcare Power of Attorney in Chart? Yes - validated most recent copy scanned in chart (See row information)     Chief Complaint  Patient presents with  . Acute Visit    Lower back pain    HPI:  Pt is a 89 y.o. female seen today for an acute visit for fell a few days ago, c/o lower back pain, able to move, ambulates with walker, no spinal spinous process tenderness, declined pain meds, ok with Voltaren topical. X-ray 02/11/23 thoracic, lumbar spine no compression deformities, mild chronic compression fx lf L1(10%)   Weigh gradually gained to 09/2021 weight, off  Mirtazapine 07/02/22 09/12/21 choledocholithiasis/ascending cholangitis s/p ERCP and stone removal. Severe sepsis/E coli bacteremia- fully treated. AST/ALT 17/9 11/10/21             Chronic cough, on Benzonatate              CKD, stage 3, Bun/creat 22/1.2 07/20/22              GERD, better indigestion, burping, on Omeprazole. prn Maalox, Hgb 12.7 07/20/22             Edema BLE, off Furosemide, not new,  EF 50-55% 06/01/2017.             HTN, takes Amlodipine              Hyperlipidemia, LDL 77 06/18/21, off Rosuvastatin             Dementia, gradual decline, 07/06/22 MMSE 22/30, failed clock drawing. TSH 1.313 09/13/21             Anemia, Vit B12 228 04/03/20, added Vit B 216/22, off Fe, Hgb 12.7 07/20/22             Osteopenia,  DEXA 07/15/22 t score -2.275     Past Medical History:  Diagnosis Date  . Arthritis    "right thumb" (01/29/2015)  . Chronic shoulder pain    "between my shoulders" (01/29/2015)  . Dizziness   . Esophageal stricture 2009  . Gallstones   . GERD (gastroesophageal reflux disease) 2004  . Hemorrhoids 2004  . Hiatal hernia 2004  . History of blood transfusion 1947   "appendix ruptured"  . HOH (hard of hearing)   . Hypertension   . Rhinitis, allergic   . Syncopal episodes    Past Surgical History:  Procedure Laterality Date  . APPENDECTOMY  1947   "ruptured"  . CARDIAC CATHETERIZATION  10/23/1999; 09/2014   EF 60% No significant CAD; patent coronary arteries.  . CARPAL TUNNEL RELEASE Right 1990's  . CHOLECYSTECTOMY N/A 01/29/2015  Procedure: LAPAROSCOPIC CHOLECYSTECTOMY;  Surgeon: Emelia Loron, MD;  Location: [facility] OR;  Service: General;  Laterality: N/A;  . DILATION AND CURETTAGE OF UTERUS  "couple"  . ERCP N/A 09/13/2021   Procedure: ENDOSCOPIC RETROGRADE CHOLANGIOPANCREATOGRAPHY (ERCP);  Surgeon: Jeani Hawking, MD;  Location: [facility];  Service: Gastroenterology;  Laterality: N/A;  . ESOPHAGOGASTRODUODENOSCOPY (EGD) WITH ESOPHAGEAL DILATION  2-3 times  . HEMORROIDECTOMY    . KNEE ARTHROSCOPY Bilateral    right x 2  . LAPAROSCOPIC CHOLECYSTECTOMY  01/29/2015  . LEFT HEART CATHETERIZATION WITH CORONARY ANGIOGRAM N/A 09/16/2014   Procedure: LEFT HEART CATHETERIZATION WITH CORONARY ANGIOGRAM;  Surgeon: Runell Gess, MD;  Location: [facility] CATH LAB;  Service: Cardiovascular;  Laterality: N/A;  . NM MYOVIEW LTD  06/01/2012   EF 71%  . REMOVAL OF STONES  09/13/2021   Procedure: REMOVAL OF STONES;  Surgeon: Jeani Hawking, MD;   Location: [facility];  Service: Gastroenterology;;  . Dennison Mascot  09/13/2021   Procedure: Dennison Mascot;  Surgeon: Jeani Hawking, MD;  Location: [facility] ENDOSCOPY;  Service: Gastroenterology;;  . VAGINAL HYSTERECTOMY  1970's    Allergies  Allergen Reactions  . Contrast Media [Iodinated Contrast Media] Itching and Rash    Pt covered in rash, red.  . Codeine Nausea And Vomiting    in large doses: can tolerate Tussionex    Allergies as of 02/14/2023       Reactions   Contrast Media [iodinated Contrast Media] Itching, Rash   Pt covered in rash, red.   Codeine Nausea And Vomiting   in large doses: can tolerate Tussionex        Medication List        Accurate as of February 14, 2023 11:59 PM. If you have any questions, ask your nurse or doctor.          acetaminophen 325 MG tablet Commonly known as: TYLENOL Take 650 mg by mouth every 6 (six) hours as needed for mild pain.   alum & mag hydroxide-simeth 400-400-40 MG/5ML suspension Commonly known as: MAALOX PLUS Take 10 mLs by mouth every 6 (six) hours as needed for indigestion or heartburn.   amLODipine 5 MG tablet Commonly known as: NORVASC Take 1 tablet (5 mg total) by mouth daily.   benzonatate 100 MG capsule Commonly known as: TESSALON Take 1 capsule by mouth 3 (three) times daily.   cyanocobalamin 1000 MCG tablet Commonly known as: VITAMIN B12 Take 1,000 mcg by mouth daily.   lidocaine 4 % Place 1 patch onto the skin every 12 (twelve) hours as needed (back pain). Remove & Discard patch within 12 hours or as directed by MD   omeprazole 40 MG capsule Commonly known as: PRILOSEC Take 40 mg by mouth daily.   ondansetron 4 MG disintegrating tablet Commonly known as: ZOFRAN-ODT Take 4 mg by mouth every 6 (six) hours as needed for nausea or vomiting.   Resource 2.0 Liqd Take by mouth. 180 ml; oral Twice A Day Special Instructions: administer with med pass        Review of Systems  Constitutional:   Negative for appetite change, fatigue and fever.  HENT:  Positive for hearing loss. Negative for congestion and trouble swallowing.   Respiratory:  Positive for cough. Negative for chest tightness and shortness of breath.   Cardiovascular:  Positive for leg swelling.  Gastrointestinal:  Negative for abdominal pain and constipation.  Genitourinary:  Positive for frequency and hematuria. Negative for dysuria and urgency.       Baseline urinary frequency  Musculoskeletal:  Positive for back pain and gait problem.  Skin:  Negative for color change.  Neurological:  Negative for speech difficulty, weakness and light-headedness.       Memory lapses.   Psychiatric/Behavioral:  Negative for behavioral problems and sleep disturbance. The patient is not nervous/anxious.     Immunization History  Administered Date(s) Administered  . Fluad Quad(high Dose 65+) 03/29/2022  . Influenza Split 03/16/2011, 03/01/2012  . Influenza Whole 03/28/2007, 04/01/2009, 02/18/2010  . Influenza, High Dose Seasonal PF 03/14/2013  . Influenza,inj,Quad PF,6+ Mos 03/20/2014, 03/11/2015  . Influenza-Unspecified 08/06/2018, 03/20/2020  . Moderna SARS-COV2 Booster Vaccination 11/04/2020  . Moderna Sars-Covid-2 Vaccination 06/11/2019, 07/09/2019, 02/24/2021  . PPD Test 04/23/2020, 11/03/2021  . Research officer, trade union 20yrs & up 04/08/2022  . Pneumococcal Conjugate-13 02/25/2015  . Pneumococcal Polysaccharide-23 03/28/2007  . Td 12/20/2005   Pertinent  Health Maintenance Due  Topic Date Due  . MAMMOGRAM  09/15/2013  . INFLUENZA VACCINE  01/06/2023  . DEXA SCAN  Completed      09/15/2021    3:00 PM 09/15/2021    8:00 PM 09/16/2021   10:06 AM 07/02/2022    9:42 AM 07/06/2022    1:44 PM  Fall Risk  Falls in the past year?    0 0  Was there an injury with Fall?    0 0  Fall Risk Category Calculator    0 0  (RETIRED) Patient Fall Risk Level High fall risk High fall risk High fall risk    Patient at  Risk for Falls Due to    No Fall Risks No Fall Risks  Fall risk Follow up    Falls evaluation completed Falls evaluation completed   Functional Status Survey:    Vitals:   02/14/23 1607  BP: 134/64  Pulse: 76  Resp: 18  Temp: (!) 97 F (36.1 C)  SpO2: 98%  Weight: 194 lb 12.8 oz (88.4 kg)   Body mass index is 38.04 kg/m. Physical Exam Constitutional:      Appearance: Normal appearance.  HENT:     Head: Normocephalic and atraumatic.     Mouth/Throat:     Mouth: Mucous membranes are moist.  Eyes:     Extraocular Movements: Extraocular movements intact.     Conjunctiva/sclera: Conjunctivae normal.     Pupils: Pupils are equal, round, and reactive to light.  Cardiovascular:     Rate and Rhythm: Normal rate and regular rhythm.     Heart sounds: No murmur heard. Pulmonary:     Effort: Pulmonary effort is normal.     Breath sounds: No rales.  Abdominal:     General: Bowel sounds are normal.     Palpations: Abdomen is soft.     Tenderness: There is no abdominal tenderness. There is no right CVA tenderness, left CVA tenderness, guarding or rebound.  Musculoskeletal:        General: Tenderness present.     Cervical back: Normal range of motion and neck supple.     Right lower leg: Edema present.     Left lower leg: Edema present.     Comments: BLE chronic venous insufficiency skin changes. Trace edema BLE.  Lower back pain about T2/L1 region, no spinal spinous process tenderness noted.   Skin:    General: Skin is warm and dry.  Neurological:     General: No focal deficit present.     Mental Status: She is alert. Mental status is at baseline.  Gait: Gait abnormal.     Comments: Oriented to person, her room on unit.   Psychiatric:        Mood and Affect: Mood normal.        Behavior: Behavior normal.    Labs reviewed: Recent Labs    07/20/22 0655  NA 140  K 4.3  CL 105  CO2 28*  BUN 22*  CREATININE 1.2*  CALCIUM 9.0   Recent Labs    07/20/22 0655  AST  13  ALT 9  ALKPHOS 73  ALBUMIN 3.4*   Recent Labs    07/20/22 0655  WBC 7.7  NEUTROABS 3,704.00  HGB 12.7  HCT 37  PLT 233   Lab Results  Component Value Date   TSH 1.313 09/13/2021   Lab Results  Component Value Date   HGBA1C 5.8 07/11/2017   Lab Results  Component Value Date   CHOL 136 06/18/2021   HDL 34 (A) 06/18/2021   LDLCALC 77 06/18/2021   LDLDIRECT 161.2 07/18/2013   TRIG 151 06/18/2021   CHOLHDL 2.9 04/03/2020    Significant Diagnostic Results in last 30 days:  No results found.  Assessment/Plan: Backache  fell a few days ago, c/o lower back pain, able to move, ambulates with walker, no spinal spinous process tenderness, declined pain meds, ok with Voltaren topical. X-ray 02/11/23 thoracic, lumbar spine no compression deformities, mild chronic compression fx lf L1(10%)  COUGH, CHRONIC  on Benzonatate   CKD (chronic kidney disease) stage 3, GFR 30-59 ml/min (HCC) Bun/creat 22/1.2 07/20/22   GERD (gastroesophageal reflux disease) better indigestion, burping, on Omeprazole. prn Maalox, Hgb 12.7 07/20/22  Edema of both lower extremities due to peripheral venous insufficiency  off Furosemide, not new,  EF 50-55% 06/01/2017  Hypertension  takes Amlodipine, blood pressure is controlled  Memory loss gradual decline, 07/06/22 MMSE 22/30, failed clock drawing. TSH 1.313 09/13/21  Senile dementia (HCC) gradual decline, 07/06/22 MMSE 22/30, failed clock drawing. TSH 1.313 09/13/21  Anemia, iron deficiency Vit B12 228 04/03/20, added Vit B 216/22, off Fe, Hgb 12.7 07/20/22    Family/ staff Communication: plan of care reviewed with the patient and charge nurse.   Labs/tests ordered: Xray thoracic, lumbar spine done 02/11/23  Time spend 40 minutes.

## 2023-02-14 NOTE — Assessment & Plan Note (Signed)
Bun/creat 22/1.2 07/20/22

## 2023-02-14 NOTE — Assessment & Plan Note (Signed)
takes Amlodipine, blood pressure is controlled

## 2023-02-14 NOTE — Assessment & Plan Note (Signed)
Vit B12 228 04/03/20, added Vit B 216/22, off Fe, Hgb 12.7 07/20/22

## 2023-02-21 ENCOUNTER — Encounter: Payer: Self-pay | Admitting: Nurse Practitioner

## 2023-02-21 ENCOUNTER — Non-Acute Institutional Stay: Payer: Medicare Other | Admitting: Nurse Practitioner

## 2023-02-21 DIAGNOSIS — E785 Hyperlipidemia, unspecified: Secondary | ICD-10-CM

## 2023-02-21 DIAGNOSIS — I872 Venous insufficiency (chronic) (peripheral): Secondary | ICD-10-CM

## 2023-02-21 DIAGNOSIS — M949 Disorder of cartilage, unspecified: Secondary | ICD-10-CM

## 2023-02-21 DIAGNOSIS — M545 Low back pain, unspecified: Secondary | ICD-10-CM

## 2023-02-21 DIAGNOSIS — I10 Essential (primary) hypertension: Secondary | ICD-10-CM

## 2023-02-21 DIAGNOSIS — M899 Disorder of bone, unspecified: Secondary | ICD-10-CM | POA: Diagnosis not present

## 2023-02-21 DIAGNOSIS — N1831 Chronic kidney disease, stage 3a: Secondary | ICD-10-CM

## 2023-02-21 DIAGNOSIS — R053 Chronic cough: Secondary | ICD-10-CM | POA: Diagnosis not present

## 2023-02-21 DIAGNOSIS — K219 Gastro-esophageal reflux disease without esophagitis: Secondary | ICD-10-CM

## 2023-02-21 DIAGNOSIS — D509 Iron deficiency anemia, unspecified: Secondary | ICD-10-CM | POA: Diagnosis not present

## 2023-02-21 DIAGNOSIS — F0390 Unspecified dementia without behavioral disturbance: Secondary | ICD-10-CM

## 2023-02-21 NOTE — Assessment & Plan Note (Signed)
Bun/creat 22/1.2 07/20/22

## 2023-02-21 NOTE — Progress Notes (Unsigned)
Location:   [facility] Room Number: 804 Place of Service:  ALF (13) Provider: Arna Snipe Hayward Rylander NP  Mahlon Gammon, MD  Patient Care Team: Mahlon Gammon, MD as PCP - General (Internal Medicine) Runell Gess, MD as Consulting Physician (Cardiology)  Extended Emergency Contact Information Primary Emergency Contact: Elita Quick Address: [address]          [place], [place] [address] [place] Home Phone: [phone] Mobile Phone: [phone] Relation: Niece  Code Status: DNR Goals of care: Advanced Directive information    07/20/2022    2:31 PM  Advanced Directives  Does Patient Have a Medical Advance Directive? Yes  Type of Estate agent of [place];Out of facility DNR (pink MOST or yellow form)  Does patient want to make changes to medical advance directive? No - Patient declined  Copy of Healthcare Power of Attorney in Chart? Yes - validated most recent copy scanned in chart (See row information)     Chief Complaint  Patient presents with  . Acute Visit    Confusion, sundowning symptoms.     HPI:  Pt is a 89 y.o. female seen today for an acute visit for reported sundowning wandering into other residents' room and plundering through their belongings, able to be redirected, appears forgetful and anxious.    Lower back pain, able to move, ambulates with walker, no spinal spinous process tenderness, declined pain meds, ok with Voltaren topical. X-ray 02/11/23 thoracic, lumbar spine no compression deformities, mild chronic compression fx lf L1(10%)               Weigh gradually gained to 09/2021 weight, off  Mirtazapine 07/02/22 09/12/21 choledocholithiasis/ascending cholangitis s/p ERCP and stone removal. Severe sepsis/E coli bacteremia- fully treated. AST/ALT 17/9 11/10/21             Chronic cough, on Benzonatate              CKD, stage 3, Bun/creat 22/1.2 07/20/22              GERD, better indigestion, burping, on  Omeprazole. prn Maalox, Hgb 12.7 07/20/22             Edema BLE, off Furosemide, not new,  EF 50-55% 06/01/2017.             HTN, takes Amlodipine             Hyperlipidemia, LDL 77 06/18/21, off Rosuvastatin             Dementia, gradual decline, 07/06/22 MMSE 22/30, failed clock drawing. TSH 1.313 09/13/21             Anemia, Vit B12 228 04/03/20, added Vit B 216/22, off Fe, Hgb 12.7 07/20/22             Osteopenia,  DEXA 07/15/22 t score -2.275     Past Medical History:  Diagnosis Date  . Arthritis    "right thumb" (01/29/2015)  . Chronic shoulder pain    "between my shoulders" (01/29/2015)  . Dizziness   . Esophageal stricture 2009  . Gallstones   . GERD (gastroesophageal reflux disease) 2004  . Hemorrhoids 2004  . Hiatal hernia 2004  . History of blood transfusion 1947   "appendix ruptured"  . HOH (hard of hearing)   . Hypertension   . Rhinitis, allergic   . Syncopal episodes    Past Surgical History:  Procedure Laterality Date  . APPENDECTOMY  1947   "  ruptured"  . CARDIAC CATHETERIZATION  10/23/1999; 09/2014   EF 60% No significant CAD; patent coronary arteries.  . CARPAL TUNNEL RELEASE Right 1990's  . CHOLECYSTECTOMY N/A 01/29/2015   Procedure: LAPAROSCOPIC CHOLECYSTECTOMY;  Surgeon: Emelia Loron, MD;  Location: [facility] OR;  Service: General;  Laterality: N/A;  . DILATION AND CURETTAGE OF UTERUS  "couple"  . ERCP N/A 09/13/2021   Procedure: ENDOSCOPIC RETROGRADE CHOLANGIOPANCREATOGRAPHY (ERCP);  Surgeon: Jeani Hawking, MD;  Location: [facility];  Service: Gastroenterology;  Laterality: N/A;  . ESOPHAGOGASTRODUODENOSCOPY (EGD) WITH ESOPHAGEAL DILATION  2-3 times  . HEMORROIDECTOMY    . KNEE ARTHROSCOPY Bilateral    right x 2  . LAPAROSCOPIC CHOLECYSTECTOMY  01/29/2015  . LEFT HEART CATHETERIZATION WITH CORONARY ANGIOGRAM N/A 09/16/2014   Procedure: LEFT HEART CATHETERIZATION WITH CORONARY ANGIOGRAM;  Surgeon: Runell Gess, MD;  Location: [facility] CATH LAB;  Service: Cardiovascular;   Laterality: N/A;  . NM MYOVIEW LTD  06/01/2012   EF 71%  . REMOVAL OF STONES  09/13/2021   Procedure: REMOVAL OF STONES;  Surgeon: Jeani Hawking, MD;  Location: [facility];  Service: Gastroenterology;;  . Dennison Mascot  09/13/2021   Procedure: Dennison Mascot;  Surgeon: Jeani Hawking, MD;  Location: [facility]OSCOPY;  Service: Gastroenterology;;  . VAGINAL HYSTERECTOMY  1970's    Allergies  Allergen Reactions  . Contrast Media [Iodinated Contrast Media] Itching and Rash    Pt covered in rash, red.  . Codeine Nausea And Vomiting    in large doses: can tolerate Tussionex    Allergies as of 02/21/2023       Reactions   Contrast Media [iodinated Contrast Media] Itching, Rash   Pt covered in rash, red.   Codeine Nausea And Vomiting   in large doses: can tolerate Tussionex        Medication List        Accurate as of February 21, 2023 10:11 AM. If you have any questions, ask your nurse or doctor.          acetaminophen 325 MG tablet Commonly known as: TYLENOL Take 650 mg by mouth every 6 (six) hours as needed for mild pain.   alum & mag hydroxide-simeth 400-400-40 MG/5ML suspension Commonly known as: MAALOX PLUS Take 10 mLs by mouth every 6 (six) hours as needed for indigestion or heartburn.   amLODipine 5 MG tablet Commonly known as: NORVASC Take 1 tablet (5 mg total) by mouth daily.   benzonatate 100 MG capsule Commonly known as: TESSALON Take 1 capsule by mouth 3 (three) times daily.   cyanocobalamin 1000 MCG tablet Commonly known as: VITAMIN B12 Take 1,000 mcg by mouth daily.   lidocaine 4 % Place 1 patch onto the skin every 12 (twelve) hours as needed (back pain). Remove & Discard patch within 12 hours or as directed by MD   omeprazole 40 MG capsule Commonly known as: PRILOSEC Take 40 mg by mouth daily.   ondansetron 4 MG disintegrating tablet Commonly known as: ZOFRAN-ODT Take 4 mg by mouth every 6 (six) hours as needed for nausea or vomiting.    Resource 2.0 Liqd Take by mouth. 180 ml; oral Twice A Day Special Instructions: administer with med pass        Review of Systems  Immunization History  Administered Date(s) Administered  . Fluad Quad(high Dose 65+) 03/29/2022  . Influenza Split 03/16/2011, 03/01/2012  . Influenza Whole 03/28/2007, 04/01/2009, 02/18/2010  . Influenza, High Dose Seasonal PF 03/14/2013  . Influenza,inj,Quad PF,6+ Mos 03/20/2014, 03/11/2015  . Influenza-Unspecified  08/06/2018, 03/20/2020  . Moderna SARS-COV2 Booster Vaccination 11/04/2020  . Moderna Sars-Covid-2 Vaccination 06/11/2019, 07/09/2019, 02/24/2021  . PPD Test 04/23/2020, 11/03/2021  . Research officer, trade union 59yrs & up 04/08/2022  . Pneumococcal Conjugate-13 02/25/2015  . Pneumococcal Polysaccharide-23 03/28/2007  . Td 12/20/2005   Pertinent  Health Maintenance Due  Topic Date Due  . MAMMOGRAM  09/15/2013  . INFLUENZA VACCINE  01/06/2023  . DEXA SCAN  Completed      09/15/2021    3:00 PM 09/15/2021    8:00 PM 09/16/2021   10:06 AM 07/02/2022    9:42 AM 07/06/2022    1:44 PM  Fall Risk  Falls in the past year?    0 0  Was there an injury with Fall?    0 0  Fall Risk Category Calculator    0 0  (RETIRED) Patient Fall Risk Level High fall risk High fall risk High fall risk    Patient at Risk for Falls Due to    No Fall Risks No Fall Risks  Fall risk Follow up    Falls evaluation completed Falls evaluation completed   Functional Status Survey:    Vitals:   02/21/23 1000  BP: (!) 162/73  Pulse: 77  Resp: 20  Temp: 97.7 F (36.5 C)  SpO2: 98%   There is no height or weight on file to calculate BMI. Physical Exam  Labs reviewed: Recent Labs    07/20/22 0655  NA 140  K 4.3  CL 105  CO2 28*  BUN 22*  CREATININE 1.2*  CALCIUM 9.0   Recent Labs    07/20/22 0655  AST 13  ALT 9  ALKPHOS 73  ALBUMIN 3.4*   Recent Labs    07/20/22 0655  WBC 7.7  NEUTROABS 3,704.00  HGB 12.7  HCT 37  PLT  233   Lab Results  Component Value Date   TSH 1.313 09/13/2021   Lab Results  Component Value Date   HGBA1C 5.8 07/11/2017   Lab Results  Component Value Date   CHOL 136 06/18/2021   HDL 34 (A) 06/18/2021   LDLCALC 77 06/18/2021   LDLDIRECT 161.2 07/18/2013   TRIG 151 06/18/2021   CHOLHDL 2.9 04/03/2020    Significant Diagnostic Results in last 30 days:  No results found.  Assessment/Plan: No problem-specific Assessment & Plan notes found for this encounter.    Family/ staff Communication: plan of care reviewed with the patient and charge nurse.   Labs/tests ordered:  CBC/diff, CMP/eGFR  Time spend 40 minutes.

## 2023-02-21 NOTE — Assessment & Plan Note (Signed)
Vit B12 228 04/03/20, added Vit B 216/22, off Fe, Hgb 12.7 07/20/22

## 2023-02-21 NOTE — Assessment & Plan Note (Signed)
DEXA 07/15/22 t score -2.275, taking Vit D, Ca

## 2023-02-21 NOTE — Assessment & Plan Note (Signed)
Blood pressure is controlled,  takes Amlodipine

## 2023-02-21 NOTE — Assessment & Plan Note (Signed)
gradual decline, 07/06/22 MMSE 22/30, failed clock drawing. TSH 1.313 09/13/21 reported sundowning wandering into other residents' room and plundering through their belongings, able to be redirected, appears forgetful and anxious.  Will update MMSE, CBC/diff, CMP/eGFR 02/22/23 MMSE 19/30, Namenda starter pk, then 10mg  bid if HPOA consents.  02/22/23 Na 137, K 4.1, Bun 20, creat 1.19, wbc 8.0, Hgb 13.4, plt 247, neutrophils 58

## 2023-02-21 NOTE — Assessment & Plan Note (Signed)
better indigestion, burping, on Omeprazole. prn Maalox, Hgb 12.7 07/20/22

## 2023-02-21 NOTE — Assessment & Plan Note (Signed)
LDL 77 06/18/21, off Rosuvastatin

## 2023-02-21 NOTE — Assessment & Plan Note (Signed)
off Furosemide, not new,  EF 50-55% 06/01/2017.

## 2023-02-21 NOTE — Assessment & Plan Note (Signed)
on Benzonatate  ?

## 2023-02-21 NOTE — Assessment & Plan Note (Signed)
Lower back pain, able to move, ambulates with walker, no spinal spinous process tenderness, declined pain meds, ok with Voltaren topical. X-ray 02/11/23 thoracic, lumbar spine no compression deformities, mild chronic compression fx lf L1(10%)

## 2023-02-22 DIAGNOSIS — I10 Essential (primary) hypertension: Secondary | ICD-10-CM | POA: Diagnosis not present

## 2023-02-22 DIAGNOSIS — M5459 Other low back pain: Secondary | ICD-10-CM | POA: Diagnosis not present

## 2023-02-22 DIAGNOSIS — R2681 Unsteadiness on feet: Secondary | ICD-10-CM | POA: Diagnosis not present

## 2023-02-23 DIAGNOSIS — M5459 Other low back pain: Secondary | ICD-10-CM | POA: Diagnosis not present

## 2023-02-23 DIAGNOSIS — R2681 Unsteadiness on feet: Secondary | ICD-10-CM | POA: Diagnosis not present

## 2023-02-28 DIAGNOSIS — M5459 Other low back pain: Secondary | ICD-10-CM | POA: Diagnosis not present

## 2023-02-28 DIAGNOSIS — R2681 Unsteadiness on feet: Secondary | ICD-10-CM | POA: Diagnosis not present

## 2023-03-01 DIAGNOSIS — R2681 Unsteadiness on feet: Secondary | ICD-10-CM | POA: Diagnosis not present

## 2023-03-01 DIAGNOSIS — M5459 Other low back pain: Secondary | ICD-10-CM | POA: Diagnosis not present

## 2023-03-03 DIAGNOSIS — R2681 Unsteadiness on feet: Secondary | ICD-10-CM | POA: Diagnosis not present

## 2023-03-03 DIAGNOSIS — M5459 Other low back pain: Secondary | ICD-10-CM | POA: Diagnosis not present

## 2023-03-07 ENCOUNTER — Non-Acute Institutional Stay: Payer: Self-pay | Admitting: Nurse Practitioner

## 2023-03-07 ENCOUNTER — Encounter: Payer: Self-pay | Admitting: Nurse Practitioner

## 2023-03-07 DIAGNOSIS — M949 Disorder of cartilage, unspecified: Secondary | ICD-10-CM

## 2023-03-07 DIAGNOSIS — R053 Chronic cough: Secondary | ICD-10-CM

## 2023-03-07 DIAGNOSIS — I872 Venous insufficiency (chronic) (peripheral): Secondary | ICD-10-CM

## 2023-03-07 DIAGNOSIS — D509 Iron deficiency anemia, unspecified: Secondary | ICD-10-CM | POA: Diagnosis not present

## 2023-03-07 DIAGNOSIS — M5459 Other low back pain: Secondary | ICD-10-CM | POA: Diagnosis not present

## 2023-03-07 DIAGNOSIS — N1831 Chronic kidney disease, stage 3a: Secondary | ICD-10-CM

## 2023-03-07 DIAGNOSIS — K219 Gastro-esophageal reflux disease without esophagitis: Secondary | ICD-10-CM | POA: Diagnosis not present

## 2023-03-07 DIAGNOSIS — M545 Low back pain, unspecified: Secondary | ICD-10-CM | POA: Diagnosis not present

## 2023-03-07 DIAGNOSIS — F0390 Unspecified dementia without behavioral disturbance: Secondary | ICD-10-CM | POA: Diagnosis not present

## 2023-03-07 DIAGNOSIS — U071 COVID-19: Secondary | ICD-10-CM | POA: Diagnosis not present

## 2023-03-07 DIAGNOSIS — I10 Essential (primary) hypertension: Secondary | ICD-10-CM | POA: Diagnosis not present

## 2023-03-07 DIAGNOSIS — M899 Disorder of bone, unspecified: Secondary | ICD-10-CM

## 2023-03-07 DIAGNOSIS — R2681 Unsteadiness on feet: Secondary | ICD-10-CM | POA: Diagnosis not present

## 2023-03-07 NOTE — Assessment & Plan Note (Signed)
At her baseline, on Benzonatate  ?

## 2023-03-07 NOTE — Assessment & Plan Note (Signed)
Blood pressure is controlled,  takes Amlodipine, Bun/creat 20/1.19 02/22/23

## 2023-03-07 NOTE — Assessment & Plan Note (Signed)
mild, ambulates with walker, X-ray 02/11/23 thoracic, lumbar spine no compression deformities, mild chronic compression fx lf L1(10%)

## 2023-03-07 NOTE — Assessment & Plan Note (Signed)
tested COVID positive, denied nasal congestion, sore throat, SOB, change of appetite, her  baseline has been worsened, afebrile, no O2 desaturation. Desires tx: Paxlovid 150/100mg  bid x 5 days. Will adding Prednisone 20mg  every day x 5 days. Obtain CXR. Continue Benzonatate.

## 2023-03-07 NOTE — Assessment & Plan Note (Signed)
stable. Bun/creat 10/1.19 02/22/23

## 2023-03-07 NOTE — Assessment & Plan Note (Signed)
MMSE 19/30 02/22/23, tolerated Namenda well

## 2023-03-07 NOTE — Progress Notes (Unsigned)
Location:   [facility] Room Number: 804 Place of Service:  ALF (13) Provider: Arna Snipe Terriah Reggio NP  Mahlon Gammon, MD  Patient Care Team: Mahlon Gammon, MD as PCP - General (Internal Medicine) Runell Gess, MD as Consulting Physician (Cardiology)  Extended Emergency Contact Information Primary Emergency Contact: Elita Quick Address: [address]          [place], [place] [address] [place] Home Phone: [phone] Mobile Phone: [phone] Relation: Niece  Code Status: DNR Goals of care: Advanced Directive information    07/20/2022    2:31 PM  Advanced Directives  Does Patient Have a Medical Advance Directive? Yes  Type of Estate agent of [place];Out of facility DNR (pink MOST or yellow form)  Does patient want to make changes to medical advance directive? No - Patient declined  Copy of Healthcare Power of Attorney in Chart? Yes - validated most recent copy scanned in chart (See row information)     No chief complaint on file.   HPI:  Pt is a 89 y.o. female seen today for an acute visit for tested COVID positive, denied nasal congestion, sore throat, SOB, change of appetite, her  baseline cough remains no change.   Senile dementia, MMSE 19/30 02/22/23, tolerated Namenda well  Lower back pain, mild, ambulates with walker, X-ray 02/11/23 thoracic, lumbar spine no compression deformities, mild chronic compression fx lf L1(10%)               Weigh stable, off  Mirtazapine 07/02/22 09/12/21 choledocholithiasis/ascending cholangitis s/p ERCP and stone removal. Severe sepsis/E coli bacteremia- fully treated.              Chronic cough, on Benzonatate              CKD, stage 3, stable. Bun/creat 10/1.19 02/22/23             GERD, better indigestion, burping, on Omeprazole. prn Maalox, Hgb 13.4 02/22/23             Edema BLE, off Furosemide, not new,  EF 50-55% 06/01/2017.             HTN, takes Amlodipine, Bun/creat 20/1.19  02/22/23             Hyperlipidemia, LDL 77 06/18/21, off Rosuvastatin             Anemia,  Vit B12 228 04/03/20, added Vit B 216/22, off Fe, Hgb 12.7 07/20/22              Osteopenia,  DEXA 07/15/22 t score -2.275, on Ca, Vit D  Past Medical History:  Diagnosis Date   Arthritis    "right thumb" (01/29/2015)   Chronic shoulder pain    "between my shoulders" (01/29/2015)   Dizziness    Esophageal stricture 2009   Gallstones    GERD (gastroesophageal reflux disease) 2004   Hemorrhoids 2004   Hiatal hernia 2004   History of blood transfusion 1947   "appendix ruptured"   HOH (hard of hearing)    Hypertension    Rhinitis, allergic    Syncopal episodes    Past Surgical History:  Procedure Laterality Date   APPENDECTOMY  1947   "ruptured"   CARDIAC CATHETERIZATION  10/23/1999; 09/2014   EF 60% No significant CAD; patent coronary arteries.   CARPAL TUNNEL RELEASE Right 1990's   CHOLECYSTECTOMY N/A 01/29/2015   Procedure: LAPAROSCOPIC CHOLECYSTECTOMY;  Surgeon: Emelia Loron, MD;  Location: [facility] OR;  Service: General;  Laterality: N/A;   DILATION AND CURETTAGE OF UTERUS  "couple"   ERCP N/A 09/13/2021   Procedure: ENDOSCOPIC RETROGRADE CHOLANGIOPANCREATOGRAPHY (ERCP);  Surgeon: Jeani Hawking, MD;  Location: [facility];  Service: Gastroenterology;  Laterality: N/A;   ESOPHAGOGASTRODUODENOSCOPY (EGD) WITH ESOPHAGEAL DILATION  2-3 times   HEMORROIDECTOMY     KNEE ARTHROSCOPY Bilateral    right x 2   LAPAROSCOPIC CHOLECYSTECTOMY  01/29/2015   LEFT HEART CATHETERIZATION WITH CORONARY ANGIOGRAM N/A 09/16/2014   Procedure: LEFT HEART CATHETERIZATION WITH CORONARY ANGIOGRAM;  Surgeon: Runell Gess, MD;  Location: [facility] CATH LAB;  Service: Cardiovascular;  Laterality: N/A;   NM MYOVIEW LTD  06/01/2012   EF 71%   REMOVAL OF STONES  09/13/2021   Procedure: REMOVAL OF STONES;  Surgeon: Jeani Hawking, MD;  Location: [facility];  Service: Gastroenterology;;   Dennison Mascot  09/13/2021   Procedure:  Dennison Mascot;  Surgeon: Jeani Hawking, MD;  Location: [facility];  Service: Gastroenterology;;   VAGINAL HYSTERECTOMY  1970's    Allergies  Allergen Reactions   Contrast Media [Iodinated Contrast Media] Itching and Rash    Pt covered in rash, red.   Codeine Nausea And Vomiting    in large doses: can tolerate Tussionex    Allergies as of 03/07/2023       Reactions   Contrast Media [iodinated Contrast Media] Itching, Rash   Pt covered in rash, red.   Codeine Nausea And Vomiting   in large doses: can tolerate Tussionex        Medication List        Accurate as of March 07, 2023  1:49 PM. If you have any questions, ask your nurse or doctor.          acetaminophen 325 MG tablet Commonly known as: TYLENOL Take 650 mg by mouth every 6 (six) hours as needed for mild pain.   alum & mag hydroxide-simeth 400-400-40 MG/5ML suspension Commonly known as: MAALOX PLUS Take 10 mLs by mouth every 6 (six) hours as needed for indigestion or heartburn.   amLODipine 5 MG tablet Commonly known as: NORVASC Take 1 tablet (5 mg total) by mouth daily.   benzonatate 100 MG capsule Commonly known as: TESSALON Take 1 capsule by mouth 3 (three) times daily.   cyanocobalamin 1000 MCG tablet Commonly known as: VITAMIN B12 Take 1,000 mcg by mouth daily.   lidocaine 4 % Place 1 patch onto the skin every 12 (twelve) hours as needed (back pain). Remove & Discard patch within 12 hours or as directed by MD   omeprazole 40 MG capsule Commonly known as: PRILOSEC Take 40 mg by mouth daily.   ondansetron 4 MG disintegrating tablet Commonly known as: ZOFRAN-ODT Take 4 mg by mouth every 6 (six) hours as needed for nausea or vomiting.   Resource 2.0 Liqd Take by mouth. 180 ml; oral Twice A Day Special Instructions: administer with med pass        Review of Systems  Constitutional:  Negative for appetite change, fatigue and fever.  HENT:  Positive for hearing loss. Negative for  congestion and trouble swallowing.   Respiratory:  Positive for cough. Negative for chest tightness and shortness of breath.        Baseline cough.   Cardiovascular:  Positive for leg swelling.  Gastrointestinal:  Negative for abdominal pain and constipation.  Genitourinary:  Positive for frequency and hematuria. Negative for dysuria and urgency.       Baseline urinary frequency  Musculoskeletal:  Positive for back pain and gait problem.  Skin:  Negative for color change.  Neurological:  Negative for speech difficulty, weakness and light-headedness.       Memory lapses.   Psychiatric/Behavioral:  Negative for behavioral problems and sleep disturbance. The patient is not nervous/anxious.     Immunization History  Administered Date(s) Administered   Fluad Quad(high Dose 65+) 03/29/2022   Influenza Split 03/16/2011, 03/01/2012   Influenza Whole 03/28/2007, 04/01/2009, 02/18/2010   Influenza, High Dose Seasonal PF 03/14/2013   Influenza,inj,Quad PF,6+ Mos 03/20/2014, 03/11/2015   Influenza-Unspecified 08/06/2018, 03/20/2020   Moderna SARS-COV2 Booster Vaccination 11/04/2020   Moderna Sars-Covid-2 Vaccination 06/11/2019, 07/09/2019, 02/24/2021   PPD Test 04/23/2020, 11/03/2021   Pfizer Covid-19 Vaccine Bivalent Booster 52yrs & up 04/08/2022   Pneumococcal Conjugate-13 02/25/2015   Pneumococcal Polysaccharide-23 03/28/2007   Td 12/20/2005   Pertinent  Health Maintenance Due  Topic Date Due   MAMMOGRAM  09/15/2013   INFLUENZA VACCINE  01/06/2023   DEXA SCAN  Completed      09/15/2021    3:00 PM 09/15/2021    8:00 PM 09/16/2021   10:06 AM 07/02/2022    9:42 AM 07/06/2022    1:44 PM  Fall Risk  Falls in the past year?    0 0  Was there an injury with Fall?    0 0  Fall Risk Category Calculator    0 0  (RETIRED) Patient Fall Risk Level High fall risk High fall risk High fall risk    Patient at Risk for Falls Due to    No Fall Risks No Fall Risks  Fall risk Follow up    Falls  evaluation completed Falls evaluation completed   Functional Status Survey:    Vitals:   03/07/23 1347  BP: 126/66  Pulse: 74  Resp: 18  Temp: 97.6 F (36.4 C)  SpO2: 96%  Weight: 194 lb 12.8 oz (88.4 kg)   Body mass index is 38.04 kg/m. Physical Exam Constitutional:      Appearance: Normal appearance.  HENT:     Head: Normocephalic and atraumatic.     Mouth/Throat:     Mouth: Mucous membranes are moist.  Eyes:     Extraocular Movements: Extraocular movements intact.     Conjunctiva/sclera: Conjunctivae normal.     Pupils: Pupils are equal, round, and reactive to light.  Cardiovascular:     Rate and Rhythm: Normal rate and regular rhythm.     Heart sounds: No murmur heard. Pulmonary:     Effort: Pulmonary effort is normal.     Breath sounds: No rales.  Abdominal:     General: Bowel sounds are normal.     Palpations: Abdomen is soft.     Tenderness: There is no abdominal tenderness. There is no right CVA tenderness, left CVA tenderness, guarding or rebound.  Musculoskeletal:        General: Tenderness present.     Cervical back: Normal range of motion and neck supple.     Right lower leg: Edema present.     Left lower leg: Edema present.     Comments: BLE chronic venous insufficiency skin changes. Trace edema BLE.  Lower back pain about T2/L1 region, no spinal spinous process tenderness noted.   Skin:    General: Skin is warm and dry.  Neurological:     General: No focal deficit present.     Mental Status: She is alert. Mental status is at baseline.     Gait: Gait abnormal.  Comments: Oriented to person, her room on unit.   Psychiatric:        Mood and Affect: Mood normal.        Behavior: Behavior normal.     Labs reviewed: Recent Labs    07/20/22 0655  NA 140  K 4.3  CL 105  CO2 28*  BUN 22*  CREATININE 1.2*  CALCIUM 9.0   Recent Labs    07/20/22 0655  AST 13  ALT 9  ALKPHOS 73  ALBUMIN 3.4*   Recent Labs    07/20/22 0655  WBC 7.7   NEUTROABS 3,704.00  HGB 12.7  HCT 37  PLT 233   Lab Results  Component Value Date   TSH 1.313 09/13/2021   Lab Results  Component Value Date   HGBA1C 5.8 07/11/2017   Lab Results  Component Value Date   CHOL 136 06/18/2021   HDL 34 (A) 06/18/2021   LDLCALC 77 06/18/2021   LDLDIRECT 161.2 07/18/2013   TRIG 151 06/18/2021   CHOLHDL 2.9 04/03/2020    Significant Diagnostic Results in last 30 days:  No results found.  Assessment/Plan: No problem-specific Assessment & Plan notes found for this encounter.    Family/ staff Communication: plan of care reviewed with the patient and charge nurse.   Labs/tests ordered:  none   Time spend 40 minute.

## 2023-03-07 NOTE — Assessment & Plan Note (Signed)
Vit B12 228 04/03/20, added Vit B 216/22, off Fe, Hgb 12.7 07/20/22

## 2023-03-07 NOTE — Assessment & Plan Note (Signed)
DEXA 07/15/22 t score -2.275, on Ca, Vit D. Alendronate if HPOA desires.

## 2023-03-07 NOTE — Assessment & Plan Note (Signed)
better indigestion, burping, on Omeprazole. prn Maalox, Hgb 13.4 02/22/23

## 2023-03-07 NOTE — Assessment & Plan Note (Signed)
Mild, off Furosemide, not new,  EF 50-55% 06/01/2017.

## 2023-03-08 DIAGNOSIS — M5459 Other low back pain: Secondary | ICD-10-CM | POA: Diagnosis not present

## 2023-03-08 DIAGNOSIS — R2681 Unsteadiness on feet: Secondary | ICD-10-CM | POA: Diagnosis not present

## 2023-03-09 DIAGNOSIS — R059 Cough, unspecified: Secondary | ICD-10-CM | POA: Diagnosis not present

## 2023-03-10 DIAGNOSIS — M5459 Other low back pain: Secondary | ICD-10-CM | POA: Diagnosis not present

## 2023-03-10 DIAGNOSIS — R2681 Unsteadiness on feet: Secondary | ICD-10-CM | POA: Diagnosis not present

## 2023-03-11 DIAGNOSIS — R2681 Unsteadiness on feet: Secondary | ICD-10-CM | POA: Diagnosis not present

## 2023-03-11 DIAGNOSIS — M5459 Other low back pain: Secondary | ICD-10-CM | POA: Diagnosis not present

## 2023-03-15 DIAGNOSIS — M5459 Other low back pain: Secondary | ICD-10-CM | POA: Diagnosis not present

## 2023-03-15 DIAGNOSIS — R2681 Unsteadiness on feet: Secondary | ICD-10-CM | POA: Diagnosis not present

## 2023-03-17 DIAGNOSIS — M5459 Other low back pain: Secondary | ICD-10-CM | POA: Diagnosis not present

## 2023-03-17 DIAGNOSIS — R2681 Unsteadiness on feet: Secondary | ICD-10-CM | POA: Diagnosis not present

## 2023-03-18 DIAGNOSIS — R2681 Unsteadiness on feet: Secondary | ICD-10-CM | POA: Diagnosis not present

## 2023-03-18 DIAGNOSIS — M5459 Other low back pain: Secondary | ICD-10-CM | POA: Diagnosis not present

## 2023-03-22 DIAGNOSIS — R2681 Unsteadiness on feet: Secondary | ICD-10-CM | POA: Diagnosis not present

## 2023-03-22 DIAGNOSIS — M5459 Other low back pain: Secondary | ICD-10-CM | POA: Diagnosis not present

## 2023-03-23 DIAGNOSIS — M5459 Other low back pain: Secondary | ICD-10-CM | POA: Diagnosis not present

## 2023-03-23 DIAGNOSIS — R2681 Unsteadiness on feet: Secondary | ICD-10-CM | POA: Diagnosis not present

## 2023-03-26 DIAGNOSIS — R2681 Unsteadiness on feet: Secondary | ICD-10-CM | POA: Diagnosis not present

## 2023-03-26 DIAGNOSIS — M5459 Other low back pain: Secondary | ICD-10-CM | POA: Diagnosis not present

## 2023-03-29 DIAGNOSIS — R2681 Unsteadiness on feet: Secondary | ICD-10-CM | POA: Diagnosis not present

## 2023-03-29 DIAGNOSIS — M5459 Other low back pain: Secondary | ICD-10-CM | POA: Diagnosis not present

## 2023-03-30 DIAGNOSIS — R2681 Unsteadiness on feet: Secondary | ICD-10-CM | POA: Diagnosis not present

## 2023-03-30 DIAGNOSIS — M5459 Other low back pain: Secondary | ICD-10-CM | POA: Diagnosis not present

## 2023-04-03 DIAGNOSIS — R2681 Unsteadiness on feet: Secondary | ICD-10-CM | POA: Diagnosis not present

## 2023-04-03 DIAGNOSIS — M5459 Other low back pain: Secondary | ICD-10-CM | POA: Diagnosis not present

## 2023-04-04 ENCOUNTER — Non-Acute Institutional Stay: Payer: Self-pay | Admitting: Nurse Practitioner

## 2023-04-04 ENCOUNTER — Encounter: Payer: Self-pay | Admitting: Nurse Practitioner

## 2023-04-04 DIAGNOSIS — K219 Gastro-esophageal reflux disease without esophagitis: Secondary | ICD-10-CM | POA: Diagnosis not present

## 2023-04-04 DIAGNOSIS — T6591XA Toxic effect of unspecified substance, accidental (unintentional), initial encounter: Secondary | ICD-10-CM | POA: Diagnosis not present

## 2023-04-04 DIAGNOSIS — I10 Essential (primary) hypertension: Secondary | ICD-10-CM | POA: Diagnosis not present

## 2023-04-04 DIAGNOSIS — I872 Venous insufficiency (chronic) (peripheral): Secondary | ICD-10-CM | POA: Diagnosis not present

## 2023-04-04 DIAGNOSIS — D509 Iron deficiency anemia, unspecified: Secondary | ICD-10-CM

## 2023-04-04 DIAGNOSIS — F0390 Unspecified dementia without behavioral disturbance: Secondary | ICD-10-CM | POA: Diagnosis not present

## 2023-04-04 DIAGNOSIS — N1831 Chronic kidney disease, stage 3a: Secondary | ICD-10-CM | POA: Diagnosis not present

## 2023-04-04 NOTE — Assessment & Plan Note (Signed)
the patient was noted by staff to have Goo Gone on her walker in her pocket book side pocket. When nurse asked the resident what was doing with the Westley Foots, the resident replied " I drink it" 04/03/23. The patient denied sore mouth/throat, heart burns, indigestion, nausea, vomiting, her dry cough is at her baseline. No O2 desaturation.  Contact poison control center for advise Monitor the patient, close supervision for safety.  Update CBC/diff, CMP/eGFR

## 2023-04-04 NOTE — Assessment & Plan Note (Signed)
Vit B12 228 04/03/20, added Vit B 216/22, off Fe, Hgb 12.7 07/20/22

## 2023-04-04 NOTE — Assessment & Plan Note (Signed)
Minimal, off Furosemide, not new,  EF 50-55% 06/01/2017.

## 2023-04-04 NOTE — Assessment & Plan Note (Signed)
better indigestion, burping, on Omeprazole. prn Maalox, Hgb 13.4 02/22/23

## 2023-04-04 NOTE — Assessment & Plan Note (Signed)
Blood pressure is controlled,  takes Amlodipine, Bun/creat 20/1.19 02/22/23

## 2023-04-04 NOTE — Assessment & Plan Note (Addendum)
stable. Bun/creat 10/1.19 02/22/23

## 2023-04-04 NOTE — Assessment & Plan Note (Addendum)
MMSE 19/30 02/22/23, tolerated Namenda well, the patient known for waling around the facility and picking up objects. Update TSH

## 2023-04-04 NOTE — Progress Notes (Unsigned)
Location:   [facility] Room Number: 804 Place of Service:  ALF (13) Provider: Arna Snipe Kaleah Hagemeister NP  Mahlon Gammon, MD  Patient Care Team: Mahlon Gammon, MD as PCP - General (Internal Medicine) Runell Gess, MD as Consulting Physician (Cardiology)  Extended Emergency Contact Information Primary Emergency Contact: Elita Quick Address: [address]          [place], [place] [address] [place] Home Phone: [phone] Mobile Phone: [phone] Relation: Niece  Code Status: DNR Goals of care: Advanced Directive information    07/20/2022    2:31 PM  Advanced Directives  Does Patient Have a Medical Advance Directive? Yes  Type of Estate agent of [place];Out of facility DNR (pink MOST or yellow form)  Does patient want to make changes to medical advance directive? No - Patient declined  Copy of Healthcare Power of Attorney in Chart? Yes - validated most recent copy scanned in chart (See row information)     Chief Complaint  Patient presents with   Acute Visit    ? Ingestion of Goo Gone    HPI:  Pt is a 89 y.o. female seen today for an acute visit for the patient was noted by staff to have Goo Gone on her walker in her pocket book side pocket. When nurse asked the resident what was doing with the Westley Foots, the resident replied " I drink it" 04/03/23. The patient denied sore mouth/throat, heart burns, indigestion, nausea, vomiting, her dry cough is at her baseline. No O2 desaturation.   Senile dementia, MMSE 19/30 02/22/23, tolerated Namenda well, the patient known for waling around the facility and picking up objects.             Lower back pain, mild, ambulates with walker, X-ray 02/11/23 thoracic, lumbar spine no compression deformities, mild chronic compression fx lf L1(10%)               Weigh stable, off  Mirtazapine 07/02/22 09/12/21 choledocholithiasis/ascending cholangitis s/p ERCP and stone removal. Severe sepsis/E coli  bacteremia- fully treated.              Chronic cough, on Benzonatate              CKD, stage 3, stable. Bun/creat 10/1.19 02/22/23             GERD, better indigestion, burping, on Omeprazole. prn Maalox, Hgb 13.4 02/22/23             Edema BLE, off Furosemide, not new,  EF 50-55% 06/01/2017.             HTN, takes Amlodipine, Bun/creat 20/1.19 02/22/23             Hyperlipidemia, LDL 77 06/18/21, off Rosuvastatin             Anemia,  Vit B12 228 04/03/20, added Vit B 216/22, off Fe, Hgb 12.7 07/20/22             Osteopenia,  DEXA 07/15/22 t score -2.275, on Ca, Vit D       Past Medical History:  Diagnosis Date   Arthritis    "right thumb" (01/29/2015)   Chronic shoulder pain    "between my shoulders" (01/29/2015)   Dizziness    Esophageal stricture 2009   Gallstones    GERD (gastroesophageal reflux disease) 2004   Hemorrhoids 2004   Hiatal hernia 2004   History of blood transfusion 1947   "appendix ruptured"  HOH (hard of hearing)    Hypertension    Rhinitis, allergic    Syncopal episodes    Past Surgical History:  Procedure Laterality Date   APPENDECTOMY  1947   "ruptured"   CARDIAC CATHETERIZATION  10/23/1999; 09/2014   EF 60% No significant CAD; patent coronary arteries.   CARPAL TUNNEL RELEASE Right 1990's   CHOLECYSTECTOMY N/A 01/29/2015   Procedure: LAPAROSCOPIC CHOLECYSTECTOMY;  Surgeon: Emelia Loron, MD;  Location: [facility] OR;  Service: General;  Laterality: N/A;   DILATION AND CURETTAGE OF UTERUS  "couple"   ERCP N/A 09/13/2021   Procedure: ENDOSCOPIC RETROGRADE CHOLANGIOPANCREATOGRAPHY (ERCP);  Surgeon: Jeani Hawking, MD;  Location: [facility];  Service: Gastroenterology;  Laterality: N/A;   ESOPHAGOGASTRODUODENOSCOPY (EGD) WITH ESOPHAGEAL DILATION  2-3 times   HEMORROIDECTOMY     KNEE ARTHROSCOPY Bilateral    right x 2   LAPAROSCOPIC CHOLECYSTECTOMY  01/29/2015   LEFT HEART CATHETERIZATION WITH CORONARY ANGIOGRAM N/A 09/16/2014   Procedure: LEFT HEART CATHETERIZATION  WITH CORONARY ANGIOGRAM;  Surgeon: Runell Gess, MD;  Location: [facility] CATH LAB;  Service: Cardiovascular;  Laterality: N/A;   NM MYOVIEW LTD  06/01/2012   EF 71%   REMOVAL OF STONES  09/13/2021   Procedure: REMOVAL OF STONES;  Surgeon: Jeani Hawking, MD;  Location: [facility];  Service: Gastroenterology;;   Dennison Mascot  09/13/2021   Procedure: Dennison Mascot;  Surgeon: Jeani Hawking, MD;  Location: [facility];  Service: Gastroenterology;;   VAGINAL HYSTERECTOMY  1970's    Allergies  Allergen Reactions   Contrast Media [Iodinated Contrast Media] Itching and Rash    Pt covered in rash, red.   Codeine Nausea And Vomiting    in large doses: can tolerate Tussionex    Allergies as of 04/04/2023       Reactions   Contrast Media [iodinated Contrast Media] Itching, Rash   Pt covered in rash, red.   Codeine Nausea And Vomiting   in large doses: can tolerate Tussionex        Medication List        Accurate as of April 04, 2023 11:59 PM. If you have any questions, ask your nurse or doctor.          acetaminophen 325 MG tablet Commonly known as: TYLENOL Take 650 mg by mouth every 6 (six) hours as needed for mild pain.   alum & mag hydroxide-simeth 400-400-40 MG/5ML suspension Commonly known as: MAALOX PLUS Take 10 mLs by mouth every 6 (six) hours as needed for indigestion or heartburn.   amLODipine 5 MG tablet Commonly known as: NORVASC Take 1 tablet (5 mg total) by mouth daily.   benzonatate 100 MG capsule Commonly known as: TESSALON Take 1 capsule by mouth 3 (three) times daily.   cyanocobalamin 1000 MCG tablet Commonly known as: VITAMIN B12 Take 1,000 mcg by mouth daily.   lidocaine 4 % Place 1 patch onto the skin every 12 (twelve) hours as needed (back pain). Remove & Discard patch within 12 hours or as directed by MD   omeprazole 40 MG capsule Commonly known as: PRILOSEC Take 40 mg by mouth daily.   ondansetron 4 MG disintegrating tablet Commonly known  as: ZOFRAN-ODT Take 4 mg by mouth every 6 (six) hours as needed for nausea or vomiting.   Resource 2.0 Liqd Take by mouth. 180 ml; oral Twice A Day Special Instructions: administer with med pass        Review of Systems  Constitutional:  Negative for appetite change, fatigue and fever.  HENT:  Positive for hearing loss. Negative for congestion, mouth sores, sore throat and trouble swallowing.   Respiratory:  Positive for cough. Negative for chest tightness and shortness of breath.        Baseline cough.   Cardiovascular:  Positive for leg swelling.  Gastrointestinal:  Negative for abdominal pain, constipation, nausea and vomiting.  Genitourinary:  Positive for frequency and hematuria. Negative for dysuria and urgency.       Baseline urinary frequency  Musculoskeletal:  Positive for back pain and gait problem.  Skin:  Negative for color change.  Neurological:  Negative for speech difficulty, weakness and light-headedness.       Memory lapses.   Psychiatric/Behavioral:  Negative for behavioral problems and sleep disturbance. The patient is not nervous/anxious.     Immunization History  Administered Date(s) Administered   Fluad Quad(high Dose 65+) 03/29/2022   Influenza Split 03/16/2011, 03/01/2012   Influenza Whole 03/28/2007, 04/01/2009, 02/18/2010   Influenza, High Dose Seasonal PF 03/14/2013   Influenza,inj,Quad PF,6+ Mos 03/20/2014, 03/11/2015   Influenza-Unspecified 08/06/2018, 03/20/2020   Moderna SARS-COV2 Booster Vaccination 11/04/2020   Moderna Sars-Covid-2 Vaccination 06/11/2019, 07/09/2019, 02/24/2021   PPD Test 04/23/2020, 11/03/2021   Pfizer Covid-19 Vaccine Bivalent Booster 65yrs & up 04/08/2022   Pneumococcal Conjugate-13 02/25/2015   Pneumococcal Polysaccharide-23 03/28/2007   Td 12/20/2005   Pertinent  Health Maintenance Due  Topic Date Due   MAMMOGRAM  09/15/2013   INFLUENZA VACCINE  01/06/2023   DEXA SCAN  Completed      09/15/2021    3:00 PM  09/15/2021    8:00 PM 09/16/2021   10:06 AM 07/02/2022    9:42 AM 07/06/2022    1:44 PM  Fall Risk  Falls in the past year?    0 0  Was there an injury with Fall?    0 0  Fall Risk Category Calculator    0 0  (RETIRED) Patient Fall Risk Level High fall risk High fall risk High fall risk    Patient at Risk for Falls Due to    No Fall Risks No Fall Risks  Fall risk Follow up    Falls evaluation completed Falls evaluation completed   Functional Status Survey:    Vitals:   04/04/23 1324  BP: 138/62  Pulse: 77  Resp: 18  Temp: 98.6 F (37 C)  SpO2: 96%  Weight: 190 lb 3.2 oz (86.3 kg)   Body mass index is 37.15 kg/m. Physical Exam Constitutional:      Appearance: Normal appearance.  HENT:     Head: Normocephalic and atraumatic.     Mouth/Throat:     Mouth: Mucous membranes are moist.     Pharynx: No oropharyngeal exudate or posterior oropharyngeal erythema.  Eyes:     Extraocular Movements: Extraocular movements intact.     Conjunctiva/sclera: Conjunctivae normal.     Pupils: Pupils are equal, round, and reactive to light.  Cardiovascular:     Rate and Rhythm: Normal rate and regular rhythm.     Heart sounds: No murmur heard. Pulmonary:     Effort: Pulmonary effort is normal.     Breath sounds: No rales.  Abdominal:     General: Bowel sounds are normal.     Palpations: Abdomen is soft.     Tenderness: There is no abdominal tenderness. There is no right CVA tenderness, left CVA tenderness, guarding or rebound.  Musculoskeletal:        General: Tenderness present.     Cervical back:  Normal range of motion and neck supple.     Right lower leg: Edema present.     Left lower leg: Edema present.     Comments: BLE chronic venous insufficiency skin changes. Trace edema BLE.  Lower back pain about T2/L1 region, no spinal spinous process tenderness noted.   Skin:    General: Skin is warm and dry.  Neurological:     General: No focal deficit present.     Mental Status: She is  alert. Mental status is at baseline.     Gait: Gait abnormal.     Comments: Oriented to person, her room on unit. Habitual picking up objects when walking around facility.   Psychiatric:        Mood and Affect: Mood normal.        Behavior: Behavior normal.     Labs reviewed: Recent Labs    07/20/22 0655  NA 140  K 4.3  CL 105  CO2 28*  BUN 22*  CREATININE 1.2*  CALCIUM 9.0   Recent Labs    07/20/22 0655  AST 13  ALT 9  ALKPHOS 73  ALBUMIN 3.4*   Recent Labs    07/20/22 0655  WBC 7.7  NEUTROABS 3,704.00  HGB 12.7  HCT 37  PLT 233   Lab Results  Component Value Date   TSH 1.313 09/13/2021   Lab Results  Component Value Date   HGBA1C 5.8 07/11/2017   Lab Results  Component Value Date   CHOL 136 06/18/2021   HDL 34 (A) 06/18/2021   LDLCALC 77 06/18/2021   LDLDIRECT 161.2 07/18/2013   TRIG 151 06/18/2021   CHOLHDL 2.9 04/03/2020    Significant Diagnostic Results in last 30 days:  No results found.  Assessment/Plan: Accidental ingestion of substance the patient was noted by staff to have Goo Gone on her walker in her pocket book side pocket. When nurse asked the resident what was doing with the Westley Foots, the resident replied " I drink it" 04/03/23. The patient denied sore mouth/throat, heart burns, indigestion, nausea, vomiting, her dry cough is at her baseline. No O2 desaturation.  Contact poison control center for advise Monitor the patient, close supervision for safety.  Update CBC/diff, CMP/eGFR  Senile dementia (HCC)  MMSE 19/30 02/22/23, tolerated Namenda well, the patient known for waling around the facility and picking up objects. Update TSH  CKD (chronic kidney disease) stage 3, GFR 30-59 ml/min (HCC) stable. Bun/creat 10/1.19 02/22/23  GERD (gastroesophageal reflux disease) better indigestion, burping, on Omeprazole. prn Maalox, Hgb 13.4 02/22/23  Edema of both lower extremities due to peripheral venous insufficiency Minimal, off  Furosemide, not new,  EF 50-55% 06/01/2017.  Hypertension Blood pressure is controlled,  takes Amlodipine, Bun/creat 20/1.19 02/22/23  Anemia, iron deficiency Vit B12 228 04/03/20, added Vit B 216/22, off Fe, Hgb 12.7 07/20/22    Family/ staff Communication: plan of care reviewed with the patient and charge nurse.   Labs/tests ordered:  CBC/diff, CMP/eGFR, TSH  Time spend 40 minutes.

## 2023-04-05 DIAGNOSIS — F419 Anxiety disorder, unspecified: Secondary | ICD-10-CM | POA: Diagnosis not present

## 2023-04-05 DIAGNOSIS — E039 Hypothyroidism, unspecified: Secondary | ICD-10-CM | POA: Diagnosis not present

## 2023-04-05 DIAGNOSIS — D649 Anemia, unspecified: Secondary | ICD-10-CM | POA: Diagnosis not present

## 2023-04-06 DIAGNOSIS — R2681 Unsteadiness on feet: Secondary | ICD-10-CM | POA: Diagnosis not present

## 2023-04-06 DIAGNOSIS — M5459 Other low back pain: Secondary | ICD-10-CM | POA: Diagnosis not present

## 2023-04-07 DIAGNOSIS — R2681 Unsteadiness on feet: Secondary | ICD-10-CM | POA: Diagnosis not present

## 2023-04-07 DIAGNOSIS — M5459 Other low back pain: Secondary | ICD-10-CM | POA: Diagnosis not present

## 2023-04-08 DIAGNOSIS — M5459 Other low back pain: Secondary | ICD-10-CM | POA: Diagnosis not present

## 2023-04-08 DIAGNOSIS — R2681 Unsteadiness on feet: Secondary | ICD-10-CM | POA: Diagnosis not present

## 2023-04-12 DIAGNOSIS — R2681 Unsteadiness on feet: Secondary | ICD-10-CM | POA: Diagnosis not present

## 2023-04-12 DIAGNOSIS — M5459 Other low back pain: Secondary | ICD-10-CM | POA: Diagnosis not present

## 2023-04-14 DIAGNOSIS — R2681 Unsteadiness on feet: Secondary | ICD-10-CM | POA: Diagnosis not present

## 2023-04-14 DIAGNOSIS — M5459 Other low back pain: Secondary | ICD-10-CM | POA: Diagnosis not present

## 2023-04-16 DIAGNOSIS — R2681 Unsteadiness on feet: Secondary | ICD-10-CM | POA: Diagnosis not present

## 2023-04-16 DIAGNOSIS — M5459 Other low back pain: Secondary | ICD-10-CM | POA: Diagnosis not present

## 2023-04-18 ENCOUNTER — Non-Acute Institutional Stay: Payer: Medicare Other | Admitting: Sports Medicine

## 2023-04-18 DIAGNOSIS — S93112A Dislocation of interphalangeal joint of left great toe, initial encounter: Secondary | ICD-10-CM

## 2023-04-18 NOTE — Progress Notes (Signed)
Provider:  Andree Coss Location:   [facility]   Place of Service:   Assisted living   PCP: Mahlon Gammon, MD Patient Care Team: Mahlon Gammon, MD as PCP - General (Internal Medicine) Runell Gess, MD as Consulting Physician (Cardiology)  Extended Emergency Contact Information Primary Emergency Contact: Elita Quick Address: [address]          [place], [place] [address] [place] Home Phone: [phone] Mobile Phone: [phone] Relation: Niece  Code Status:  Goals of Care: Advanced Directive information    07/20/2022    2:31 PM  Advanced Directives  Does Patient Have a Medical Advance Directive? Yes  Type of Estate agent of [place];Out of facility DNR (pink MOST or yellow form)  Does patient want to make changes to medical advance directive? No - Patient declined  Copy of Healthcare Power of Attorney in Chart? Yes - validated most recent copy scanned in chart (See row information)      No chief complaint on file.   HPI: Patient is a 89 y.o. female seen today for acute visit for  swelling around her left great toe  Pt seen and examined  Knows her name , oriented to  time  Can remember what she had for breakfast Does not remember  if she bumped her feet  Ambulates with a walker  Watching TV and does not appear to be in distress Denies pain, able to move her extremities    Past Medical History:  Diagnosis Date   Arthritis    "right thumb" (01/29/2015)   Chronic shoulder pain    "between my shoulders" (01/29/2015)   Dizziness    Esophageal stricture 2009   Gallstones    GERD (gastroesophageal reflux disease) 2004   Hemorrhoids 2004   Hiatal hernia 2004   History of blood transfusion 1947   "appendix ruptured"   HOH (hard of hearing)    Hypertension    Rhinitis, allergic    Syncopal episodes    Past Surgical History:  Procedure Laterality Date   APPENDECTOMY  1947   "ruptured"    CARDIAC CATHETERIZATION  10/23/1999; 09/2014   EF 60% No significant CAD; patent coronary arteries.   CARPAL TUNNEL RELEASE Right 1990's   CHOLECYSTECTOMY N/A 01/29/2015   Procedure: LAPAROSCOPIC CHOLECYSTECTOMY;  Surgeon: Emelia Loron, MD;  Location: [facility] OR;  Service: General;  Laterality: N/A;   DILATION AND CURETTAGE OF UTERUS  "couple"   ERCP N/A 09/13/2021   Procedure: ENDOSCOPIC RETROGRADE CHOLANGIOPANCREATOGRAPHY (ERCP);  Surgeon: Jeani Hawking, MD;  Location: [facility];  Service: Gastroenterology;  Laterality: N/A;   ESOPHAGOGASTRODUODENOSCOPY (EGD) WITH ESOPHAGEAL DILATION  2-3 times   HEMORROIDECTOMY     KNEE ARTHROSCOPY Bilateral    right x 2   LAPAROSCOPIC CHOLECYSTECTOMY  01/29/2015   LEFT HEART CATHETERIZATION WITH CORONARY ANGIOGRAM N/A 09/16/2014   Procedure: LEFT HEART CATHETERIZATION WITH CORONARY ANGIOGRAM;  Surgeon: Runell Gess, MD;  Location: [facility] CATH LAB;  Service: Cardiovascular;  Laterality: N/A;   NM MYOVIEW LTD  06/01/2012   EF 71%   REMOVAL OF STONES  09/13/2021   Procedure: REMOVAL OF STONES;  Surgeon: Jeani Hawking, MD;  Location: [facility];  Service: Gastroenterology;;   Dennison Mascot  09/13/2021   Procedure: Dennison Mascot;  Surgeon: Jeani Hawking, MD;  Location: [facility] ENDOSCOPY;  Service: Gastroenterology;;   VAGINAL HYSTERECTOMY  1970's    reports that she has never smoked. She has never used smokeless tobacco. She reports that she  does not drink alcohol and does not use drugs. 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   She does drink/eat things with caffeine in it.   Marital status: widow (married in [place])   Patient lives in an assisted living 1 story apartment home   Highest level of education completed was high school.   Past profession: home maker   Exercise: walk 3 x week   Patient has a POA and living   Social Determinants of Health   Financial Resource Strain: Not on file  Food Insecurity: Not on file  Transportation Needs: Not on file  Physical Activity: Not on file  Stress: Not on file  Social Connections: Not on file  Intimate Partner Violence: Not on file    Functional Status Survey:    Family History  Problem Relation Age of Onset   Stroke Father 42       light stroke   Heart disease Sister    COPD Sister    Heart disease Brother    Coronary artery disease Neg Hx    Diabetes Neg Hx    Cancer Neg Hx        breast, colon, prostate    Health Maintenance  Topic Date Due   Zoster Vaccines- Shingrix (1 of 2) Never done   MAMMOGRAM  09/15/2013   DTaP/Tdap/Td (2 - Tdap) 12/21/2015   INFLUENZA VACCINE  01/06/2023   COVID-19 Vaccine (6 - 2023-24 season) 02/06/2023   Medicare Annual Wellness (AWV)  07/07/2023   Pneumonia Vaccine 37+ Years old  Completed   DEXA SCAN  Completed   HPV VACCINES  Aged Out    Allergies  Allergen Reactions   Contrast Media [Iodinated Contrast Media] Itching and Rash    Pt covered in rash, red.   Codeine Nausea And Vomiting    in large doses: can tolerate Tussionex    Outpatient Encounter Medications as of 04/18/2023  Medication Sig   acetaminophen (TYLENOL) 325 MG tablet Take 650 mg by mouth every 6 (six) hours as needed for mild pain.   alum & mag hydroxide-simeth (MAALOX PLUS) 400-400-40 MG/5ML suspension Take 10 mLs by mouth every 6 (six) hours as  needed for indigestion or heartburn.   amLODipine (NORVASC) 5 MG tablet Take 1 tablet (5 mg total) by mouth daily.   benzonatate (TESSALON) 100 MG capsule Take 1 capsule by mouth 3 (three) times daily.   lidocaine 4 % Place 1 patch onto the skin every 12 (twelve) hours as needed (back pain). Remove & Discard patch within 12 hours or as directed by MD   Nutritional Supplements (RESOURCE 2.0) LIQD Take by mouth. 180 ml; oral Twice A Day Special Instructions: administer with med pass   omeprazole (PRILOSEC) 40 MG capsule  Take 40 mg by mouth daily.   ondansetron (ZOFRAN-ODT) 4 MG disintegrating tablet Take 4 mg by mouth every 6 (six) hours as needed for nausea or vomiting.   vitamin B-12 (CYANOCOBALAMIN) 1000 MCG tablet Take 1,000 mcg by mouth daily.   No facility-administered encounter medications on file as of 04/18/2023.    Review of Systems  There were no vitals filed for this visit. There is no height or weight on file to calculate BMI. Physical Exam Constitutional:      Appearance: Normal appearance.  Cardiovascular:     Rate and Rhythm: Normal rate and regular rhythm.  Pulmonary:     Effort: Pulmonary effort is normal. No respiratory distress.     Breath sounds: Normal breath sounds. No wheezing.  Abdominal:     General: Bowel sounds are normal. There is no distension.     Tenderness: There is no abdominal tenderness. There is no guarding or rebound.  Musculoskeletal:        General: No swelling or tenderness.     Comments: Left great toe - swollen, bruised  Non tender Rom intact  Pedal pulse intact  Skin:    General: Skin is dry.  Neurological:     Mental Status: She is alert. Mental status is at baseline.     Sensory: No sensory deficit.     Motor: No weakness.     Labs reviewed: Basic Metabolic Panel: Recent Labs    07/20/22 0655  NA 140  K 4.3  CL 105  CO2 28*  BUN 22*  CREATININE 1.2*  CALCIUM 9.0   Liver Function Tests: Recent Labs    07/20/22 0655   AST 13  ALT 9  ALKPHOS 73  ALBUMIN 3.4*   No results for input(s): "LIPASE", "AMYLASE" in the last 8760 hours. No results for input(s): "AMMONIA" in the last 8760 hours. CBC: Recent Labs    07/20/22 0655  WBC 7.7  NEUTROABS 3,704.00  HGB 12.7  HCT 37  PLT 233   Cardiac Enzymes: No results for input(s): "CKTOTAL", "CKMB", "CKMBINDEX", "TROPONINI" in the last 8760 hours. BNP: Invalid input(s): "POCBNP" Lab Results  Component Value Date   HGBA1C 5.8 07/11/2017   Lab Results  Component Value Date   TSH 1.313 09/13/2021   Lab Results  Component Value Date   VITAMINB12 228 04/03/2020   Lab Results  Component Value Date   FOLATE 8.4 04/13/2010   Lab Results  Component Value Date   IRON 23 08/27/2020   TIBC 368 08/27/2020   FERRITIN 6 08/27/2020    Imaging and Procedures obtained prior to SNF admission: DG ERCP  Result Date: 09/14/2021 CLINICAL DATA:  Choledocholithiasis EXAM: ERCP TECHNIQUE: Multiple spot images obtained with the fluoroscopic device and submitted for interpretation post-procedure. FLUOROSCOPY: Radiation Exposure Index (as provided by the fluoroscopic device): 67.53 mGy Kerma COMPARISON:  CT abdomen/pelvis 09/12/2021 FINDINGS: Total of 6 intraoperative saved images are submitted for review. The images demonstrate a flexible duodenal scope in the descending duodenum with wire cannulation of the common bile duct. Subsequently, balloon occluded cholangiogram is performed demonstrating biliary ductal dilation. Filling defects in the distal common bile duct suggest choledocholithiasis. Subsequent images document sphincterotomy and balloon sweep of the common duct. IMPRESSION: 1. Choledocholithiasis. 2. ERCP with sphincterotomy and balloon sweeping of the common duct. These images were submitted for radiologic interpretation only. Please see the procedural report for the amount of contrast and the fluoroscopy time utilized. Electronically Signed   By: Isac Caddy.D.  On: 09/14/2021 08:03   CT Abdomen Pelvis Wo Contrast  Result Date: 09/12/2021 CLINICAL DATA:  Nausea/vomiting. unwitnessed fall at facility. Was found face down with vomit around her. EXAM: CT ABDOMEN AND PELVIS WITHOUT CONTRAST TECHNIQUE: Multidetector CT imaging of the abdomen and pelvis was performed following the standard protocol without IV contrast. RADIATION DOSE REDUCTION: This exam was performed according to the departmental dose-optimization program which includes automated exposure control, adjustment of the mA and/or kV according to patient size and/or use of iterative reconstruction technique. COMPARISON:  None. FINDINGS: Slightly limited evaluation due to motion artifact. Lower chest: No acute abnormality.  Tiny hiatal hernia. Liver: Mildly enlarged measuring up to 18 cm.  No focal lesion. Biliary System: Status post cholecystectomy. Enlarged common bile duct measuring up to 1.4 cm with associated intraluminal hyperdensities measuring approximately and 0.70.8 cm. Pancreas: Diffusely atrophic. No focal lesion. Query slight fat stranding along the proximal pancreas (2:31). No main pancreatic ductal dilatation. Spleen: Not enlarged. No focal lesion. Adrenal Glands: No nodularity bilaterally. Kidneys: No hydroureteronephrosis. No nephroureterolithiasis. No contour deforming renal mass. Anterior urinary bladder diverticula. Otherwise the urinary bladder is unremarkable. Bowel: No small or large bowel wall thickening or dilatation. Status post appendectomy. Mesentery, Omentum, and Peritoneum: No simple free fluid ascites. No pneumoperitoneum. No mesenteric hematoma identified. No organized fluid collection. Pelvic Organs: Status post hysterectomy. Bilateral adnexal regions are unremarkable. Lymph Nodes: No abdominal, pelvic, inguinal lymphadenopathy. Vasculature: Severe atherosclerotic plaque. No abdominal aorta or iliac aneurysm. Musculoskeletal: No significant soft tissue hematoma.  Small to moderate umbilical hernia containing fat with abdominal wall defect of 3.3 cm. No acute pelvic fracture. No spinal fracture. Multilevel degenerative changes spine. IMPRESSION: 1. Findings suggestive of choledocholithiasis in a patient status post cholecystectomy. Correlate with liver function tests. 2. Query slight fat stranding along the proximal pancreas with limited evaluation due to motion artifact. Correlate with lipase levels. 3. No acute traumatic injury to the abdomen or pelvis on this noncontrast study. 4. No acute fracture or traumatic malalignment of the lumbar spine. Other imaging findings of potential clinical significance: 1. Small hiatal hernia. 2. Fat containing umbilical hernia. A findings suggestive ischemia or bowel obstruction. 3. Aortic Atherosclerosis (ICD10-I70.0). 4. Status post cholecystectomy, appendectomy, hysterectomy. Electronically Signed   By: Tish Frederickson M.D.   On: 09/12/2021 23:37   CT Head Wo Contrast  Result Date: 09/12/2021 CLINICAL DATA:  Head trauma, minor (Age >= 65y); Polytrauma, blunt. Unwitnessed fall EXAM: CT HEAD WITHOUT CONTRAST CT CERVICAL SPINE WITHOUT CONTRAST TECHNIQUE: Multidetector CT imaging of the head and cervical spine was performed following the standard protocol without intravenous contrast. Multiplanar CT image reconstructions of the cervical spine were also generated. RADIATION DOSE REDUCTION: This exam was performed according to the departmental dose-optimization program which includes automated exposure control, adjustment of the mA and/or kV according to patient size and/or use of iterative reconstruction technique. COMPARISON:  None. FINDINGS: CT HEAD FINDINGS BRAIN: BRAIN Cerebral ventricle sizes are concordant with the degree of cerebral volume loss. Patchy and confluent areas of decreased attenuation are noted throughout the deep and periventricular white matter of the cerebral hemispheres bilaterally, compatible with chronic  microvascular ischemic disease. No evidence of large-territorial acute infarction. No parenchymal hemorrhage. No mass lesion. No extra-axial collection. No mass effect or midline shift. No hydrocephalus. Basilar cisterns are patent. Vascular: No hyperdense vessel. Atherosclerotic calcifications are present within the cavernous internal carotid arteries. Skull: No acute fracture or focal lesion. Sinuses/Orbits: Trace mucosal thickening of the right maxillary sinus. Otherwise paranasal  sinuses and mastoid air cells are clear. Bilateral lens replacement. Otherwise the orbits are unremarkable. Other: None. CT CERVICAL SPINE FINDINGS Alignment: Normal. Skull base and vertebrae: Multilevel moderate degenerative changes spine. Severe osseous neural foraminal stenosis at the C4-C5 level. No acute fracture. No aggressive appearing focal osseous lesion or focal pathologic process. Soft tissues and spinal canal: No prevertebral fluid or swelling. No visible canal hematoma. Upper chest: Unremarkable. Other: None. IMPRESSION: 1. No acute intracranial abnormality. 2. No acute displaced fracture or traumatic listhesis of the cervical spine. Electronically Signed   By: Tish Frederickson M.D.   On: 09/12/2021 23:25   CT Cervical Spine Wo Contrast  Result Date: 09/12/2021 CLINICAL DATA:  Head trauma, minor (Age >= 65y); Polytrauma, blunt. Unwitnessed fall EXAM: CT HEAD WITHOUT CONTRAST CT CERVICAL SPINE WITHOUT CONTRAST TECHNIQUE: Multidetector CT imaging of the head and cervical spine was performed following the standard protocol without intravenous contrast. Multiplanar CT image reconstructions of the cervical spine were also generated. RADIATION DOSE REDUCTION: This exam was performed according to the departmental dose-optimization program which includes automated exposure control, adjustment of the mA and/or kV according to patient size and/or use of iterative reconstruction technique. COMPARISON:  None. FINDINGS: CT HEAD  FINDINGS BRAIN: BRAIN Cerebral ventricle sizes are concordant with the degree of cerebral volume loss. Patchy and confluent areas of decreased attenuation are noted throughout the deep and periventricular white matter of the cerebral hemispheres bilaterally, compatible with chronic microvascular ischemic disease. No evidence of large-territorial acute infarction. No parenchymal hemorrhage. No mass lesion. No extra-axial collection. No mass effect or midline shift. No hydrocephalus. Basilar cisterns are patent. Vascular: No hyperdense vessel. Atherosclerotic calcifications are present within the cavernous internal carotid arteries. Skull: No acute fracture or focal lesion. Sinuses/Orbits: Trace mucosal thickening of the right maxillary sinus. Otherwise paranasal sinuses and mastoid air cells are clear. Bilateral lens replacement. Otherwise the orbits are unremarkable. Other: None. CT CERVICAL SPINE FINDINGS Alignment: Normal. Skull base and vertebrae: Multilevel moderate degenerative changes spine. Severe osseous neural foraminal stenosis at the C4-C5 level. No acute fracture. No aggressive appearing focal osseous lesion or focal pathologic process. Soft tissues and spinal canal: No prevertebral fluid or swelling. No visible canal hematoma. Upper chest: Unremarkable. Other: None. IMPRESSION: 1. No acute intracranial abnormality. 2. No acute displaced fracture or traumatic listhesis of the cervical spine. Electronically Signed   By: Tish Frederickson M.D.   On: 09/12/2021 23:25   DG Chest Port 1 View  Result Date: 09/12/2021 CLINICAL DATA:  Fever unwitnessed fall EXAM: PORTABLE CHEST 1 VIEW COMPARISON:  05/19/2017 FINDINGS: The heart size and mediastinal contours are within normal limits. Aortic atherosclerosis. Both lungs are clear. The visualized skeletal structures are unremarkable. IMPRESSION: No active disease. Electronically Signed   By: Jasmine Pang M.D.   On: 09/12/2021 22:15    Assessment/Plan  1.  Dislocation of interphalangeal joint of left great toe, initial encounter Pt noted with slight purplish discoloration of left great toe No pain or tenderness  Rom intact Will monitor  Take tylenol prn for pain  Apply ice   Family/ staff Communication: care plan discussed with the nursings taff   Labs/tests ordered:  30  Total time spent for obtaining history,  performing a medically appropriate examination and evaluation, reviewing the tests,  documenting clinical information in the electronic or other health record, independently interpreting results and communicating results to the patient/family/caregiver, care coordination

## 2023-04-22 ENCOUNTER — Encounter: Payer: Self-pay | Admitting: Sports Medicine

## 2023-04-22 DIAGNOSIS — R2681 Unsteadiness on feet: Secondary | ICD-10-CM | POA: Diagnosis not present

## 2023-04-22 DIAGNOSIS — M5459 Other low back pain: Secondary | ICD-10-CM | POA: Diagnosis not present

## 2023-04-24 DIAGNOSIS — R2681 Unsteadiness on feet: Secondary | ICD-10-CM | POA: Diagnosis not present

## 2023-04-24 DIAGNOSIS — M5459 Other low back pain: Secondary | ICD-10-CM | POA: Diagnosis not present

## 2023-04-25 DIAGNOSIS — M5459 Other low back pain: Secondary | ICD-10-CM | POA: Diagnosis not present

## 2023-04-25 DIAGNOSIS — R2681 Unsteadiness on feet: Secondary | ICD-10-CM | POA: Diagnosis not present

## 2023-04-26 DIAGNOSIS — R2681 Unsteadiness on feet: Secondary | ICD-10-CM | POA: Diagnosis not present

## 2023-04-26 DIAGNOSIS — M5459 Other low back pain: Secondary | ICD-10-CM | POA: Diagnosis not present

## 2023-04-28 DIAGNOSIS — R2681 Unsteadiness on feet: Secondary | ICD-10-CM | POA: Diagnosis not present

## 2023-04-28 DIAGNOSIS — M5459 Other low back pain: Secondary | ICD-10-CM | POA: Diagnosis not present

## 2023-05-02 ENCOUNTER — Non-Acute Institutional Stay: Payer: Medicare Other | Admitting: Sports Medicine

## 2023-05-02 ENCOUNTER — Encounter: Payer: Self-pay | Admitting: Sports Medicine

## 2023-05-02 DIAGNOSIS — R112 Nausea with vomiting, unspecified: Secondary | ICD-10-CM | POA: Diagnosis not present

## 2023-05-02 DIAGNOSIS — M5459 Other low back pain: Secondary | ICD-10-CM | POA: Diagnosis not present

## 2023-05-02 DIAGNOSIS — F0390 Unspecified dementia without behavioral disturbance: Secondary | ICD-10-CM

## 2023-05-02 DIAGNOSIS — I10 Essential (primary) hypertension: Secondary | ICD-10-CM | POA: Diagnosis not present

## 2023-05-02 DIAGNOSIS — K219 Gastro-esophageal reflux disease without esophagitis: Secondary | ICD-10-CM | POA: Diagnosis not present

## 2023-05-02 DIAGNOSIS — R2681 Unsteadiness on feet: Secondary | ICD-10-CM | POA: Diagnosis not present

## 2023-05-02 NOTE — Assessment & Plan Note (Signed)
MMSE 19/30  02/2023  Cont with supportive care Increase cognitively engaging activities Cont with namenda

## 2023-05-02 NOTE — Progress Notes (Signed)
Provider:  Andree Coss Location:   [facility]   Place of Service:   Assisted living   PCP: Venita Sheffield, MD Patient Care Team: Venita Sheffield, MD as PCP - General (Internal Medicine) Runell Gess, MD as Consulting Physician (Cardiology)  Extended Emergency Contact Information Primary Emergency Contact: Elita Quick Address: [address]          [place], [place] [address] [place] Home Phone: [phone] Mobile Phone: [phone] Relation: Niece  Code Status:  Goals of Care: Advanced Directive information    07/20/2022    2:31 PM  Advanced Directives  Does Patient Have a Medical Advance Directive? Yes  Type of Estate agent of [place];Out of facility DNR (pink MOST or yellow form)  Does patient want to make changes to medical advance directive? No - Patient declined  Copy of Healthcare Power of Attorney in Chart? Yes - validated most recent copy scanned in chart (See row information)      No chief complaint on file.   HPI: Patient is a 89 y.o. female seen today for  routine visit for chronic disease management. Pt seen and examined in the living room  Pt seen sitting in the chair listening to music Seems pleasant and comfortable  Does not appear to be in distress Pt knows her name, not oriented to time or place Denies chest pain, palpitations, SOB, abdominal pain, nausea, vomiting, dysuria, hematuria, bloody or dark stools. Ambulates with a walker Denies joint pains Pt reports interest in participating in group activities like bingo States she likes to walk      Past Medical History:  Diagnosis Date   Arthritis    "right thumb" (01/29/2015)   Chronic shoulder pain    "between my shoulders" (01/29/2015)   Dizziness    Esophageal stricture 2009   Gallstones    GERD (gastroesophageal reflux disease) 2004   Hemorrhoids 2004   Hiatal hernia 2004   History of blood  transfusion 1947   "appendix ruptured"   HOH (hard of hearing)    Hypertension    Rhinitis, allergic    Syncopal episodes    Past Surgical History:  Procedure Laterality Date   APPENDECTOMY  1947   "ruptured"   CARDIAC CATHETERIZATION  10/23/1999; 09/2014   EF 60% No significant CAD; patent coronary arteries.   CARPAL TUNNEL RELEASE Right 1990's   CHOLECYSTECTOMY N/A 01/29/2015   Procedure: LAPAROSCOPIC CHOLECYSTECTOMY;  Surgeon: Emelia Loron, MD;  Location: [facility] OR;  Service: General;  Laterality: N/A;   DILATION AND CURETTAGE OF UTERUS  "couple"   ERCP N/A 09/13/2021   Procedure: ENDOSCOPIC RETROGRADE CHOLANGIOPANCREATOGRAPHY (ERCP);  Surgeon: Jeani Hawking, MD;  Location: [facility];  Service: Gastroenterology;  Laterality: N/A;   ESOPHAGOGASTRODUODENOSCOPY (EGD) WITH ESOPHAGEAL DILATION  2-3 times   HEMORROIDECTOMY     KNEE ARTHROSCOPY Bilateral    right x 2   LAPAROSCOPIC CHOLECYSTECTOMY  01/29/2015   LEFT HEART CATHETERIZATION WITH CORONARY ANGIOGRAM N/A 09/16/2014   Procedure: LEFT HEART CATHETERIZATION WITH CORONARY ANGIOGRAM;  Surgeon: Runell Gess, MD;  Location: [facility] CATH LAB;  Service: Cardiovascular;  Laterality: N/A;   NM MYOVIEW LTD  06/01/2012   EF 71%   REMOVAL OF STONES  09/13/2021   Procedure: REMOVAL OF STONES;  Surgeon: Jeani Hawking, MD;  Location: [facility];  Service: Gastroenterology;;   Dennison Mascot  09/13/2021   Procedure: Dennison Mascot;  Surgeon: Jeani Hawking, MD;  Location: [facility];  Service: Gastroenterology;;   VAGINAL HYSTERECTOMY  1970's    reports that she has never smoked. She has never used smokeless tobacco. She reports that she does not drink alcohol and does not use drugs. Social History   Socioeconomic History   Marital status: Widowed    Spouse name: Not on file   Number of children: 0   Years of education: Not on file   Highest education level: Not on file  Occupational History   Occupation: retired    Associate Professor: RETIRED     Comment: school cafeteria mgr  Tobacco Use   Smoking status: Never   Smokeless tobacco: Never  Vaping Use   Vaping status: Never Used  Substance and Sexual Activity   Alcohol use: No    Alcohol/week: 0.0 standard drinks of alcohol   Drug use: No   Sexual activity: Never  Other Topics Concern   Not on file  Social History Narrative   Patient does not consume alcohol or use tobacco products.   She does drink/eat things with caffeine in it.   Marital status: widow (married in [place])   Patient lives in an assisted living 1 story apartment home   Highest level of education completed was high school.   Past profession: home maker   Exercise: walk 3 x week   Patient has a POA and living   Social Determinants of Health   Financial Resource Strain: Not on file  Food Insecurity: Not on file  Transportation Needs: Not on file  Physical Activity: Not on file  Stress: Not on file  Social Connections: Not on file  Intimate Partner Violence: Not on file    Functional Status Survey:    Family History  Problem Relation Age of Onset   Stroke Father 51       light stroke   Heart disease Sister    COPD Sister    Heart disease Brother    Coronary artery disease Neg Hx    Diabetes Neg Hx    Cancer Neg Hx        breast, colon, prostate    Health Maintenance  Topic Date Due   Zoster Vaccines- Shingrix (1 of 2) Never done   MAMMOGRAM  09/15/2013   DTaP/Tdap/Td (2 - Tdap) 12/21/2015   INFLUENZA VACCINE  01/06/2023   COVID-19 Vaccine (6 - 2023-24 season) 02/06/2023   Medicare Annual Wellness (AWV)  07/07/2023   Pneumonia Vaccine 90+ Years old  Completed   DEXA SCAN  Completed   HPV VACCINES  Aged Out    Allergies  Allergen Reactions   Contrast Media [Iodinated Contrast Media] Itching and Rash    Pt covered in rash, red.   Codeine Nausea And Vomiting    in large doses: can tolerate Tussionex    Outpatient Encounter Medications as of 05/02/2023  Medication Sig    acetaminophen (TYLENOL) 325 MG tablet Take 650 mg by mouth every 6 (six) hours as needed for mild pain.   alum & mag hydroxide-simeth (MAALOX PLUS) 400-400-40 MG/5ML suspension Take 10 mLs by mouth every 6 (six) hours as needed for indigestion or heartburn.   amLODipine (NORVASC) 5 MG tablet Take 1 tablet (5 mg total) by mouth daily.   benzonatate (TESSALON) 100 MG capsule Take 1 capsule by mouth 3 (three) times daily.   lidocaine 4 % Place 1 patch onto the skin every 12 (twelve) hours as needed (back pain). Remove & Discard patch within 12 hours or as directed by MD   Nutritional Supplements (RESOURCE 2.0) LIQD Take by  mouth. 180 ml; oral Twice A Day Special Instructions: administer with med pass   omeprazole (PRILOSEC) 40 MG capsule Take 40 mg by mouth daily.   ondansetron (ZOFRAN-ODT) 4 MG disintegrating tablet Take 4 mg by mouth every 6 (six) hours as needed for nausea or vomiting.   vitamin B-12 (CYANOCOBALAMIN) 1000 MCG tablet Take 1,000 mcg by mouth daily.   No facility-administered encounter medications on file as of 05/02/2023.    Review of Systems  Constitutional:  Negative for chills and fever.  HENT:  Negative for sore throat.   Respiratory:  Negative for cough, shortness of breath and wheezing.   Cardiovascular:  Negative for chest pain, palpitations and leg swelling.  Gastrointestinal:  Negative for abdominal distention, abdominal pain, blood in stool, constipation, diarrhea, nausea and vomiting.  Genitourinary:  Negative for dysuria, frequency and urgency.  Neurological:  Negative for dizziness, weakness and numbness.  Psychiatric/Behavioral:  Negative for confusion.     There were no vitals filed for this visit. There is no height or weight on file to calculate BMI. Physical Exam Constitutional:      Appearance: Normal appearance.  Cardiovascular:     Rate and Rhythm: Normal rate and regular rhythm.  Pulmonary:     Effort: Pulmonary effort is normal. No respiratory  distress.     Breath sounds: Normal breath sounds. No wheezing.  Abdominal:     General: Bowel sounds are normal. There is no distension.     Tenderness: There is no abdominal tenderness. There is no guarding or rebound.  Musculoskeletal:        General: No swelling or tenderness.  Skin:    General: Skin is dry.  Neurological:     Mental Status: She is alert. Mental status is at baseline.     Sensory: No sensory deficit.     Motor: No weakness.     Labs reviewed: Basic Metabolic Panel: Recent Labs    07/20/22 0655  NA 140  K 4.3  CL 105  CO2 28*  BUN 22*  CREATININE 1.2*  CALCIUM 9.0   Liver Function Tests: Recent Labs    07/20/22 0655  AST 13  ALT 9  ALKPHOS 73  ALBUMIN 3.4*   No results for input(s): "LIPASE", "AMYLASE" in the last 8760 hours. No results for input(s): "AMMONIA" in the last 8760 hours. CBC: Recent Labs    07/20/22 0655  WBC 7.7  NEUTROABS 3,704.00  HGB 12.7  HCT 37  PLT 233   Cardiac Enzymes: No results for input(s): "CKTOTAL", "CKMB", "CKMBINDEX", "TROPONINI" in the last 8760 hours. BNP: Invalid input(s): "POCBNP" Lab Results  Component Value Date   HGBA1C 5.8 07/11/2017   Lab Results  Component Value Date   TSH 1.313 09/13/2021   Lab Results  Component Value Date   VITAMINB12 228 04/03/2020   Lab Results  Component Value Date   FOLATE 8.4 04/13/2010   Lab Results  Component Value Date   IRON 23 08/27/2020   TIBC 368 08/27/2020   FERRITIN 6 08/27/2020    Imaging and Procedures obtained prior to SNF admission: DG ERCP  Result Date: 09/14/2021 CLINICAL DATA:  Choledocholithiasis EXAM: ERCP TECHNIQUE: Multiple spot images obtained with the fluoroscopic device and submitted for interpretation post-procedure. FLUOROSCOPY: Radiation Exposure Index (as provided by the fluoroscopic device): 67.53 mGy Kerma COMPARISON:  CT abdomen/pelvis 09/12/2021 FINDINGS: Total of 6 intraoperative saved images are submitted for review. The  images demonstrate a flexible duodenal scope in the descending duodenum with wire  cannulation of the common bile duct. Subsequently, balloon occluded cholangiogram is performed demonstrating biliary ductal dilation. Filling defects in the distal common bile duct suggest choledocholithiasis. Subsequent images document sphincterotomy and balloon sweep of the common duct. IMPRESSION: 1. Choledocholithiasis. 2. ERCP with sphincterotomy and balloon sweeping of the common duct. These images were submitted for radiologic interpretation only. Please see the procedural report for the amount of contrast and the fluoroscopy time utilized. Electronically Signed   By: Malachy Moan M.D.   On: 09/14/2021 08:03   CT Abdomen Pelvis Wo Contrast  Result Date: 09/12/2021 CLINICAL DATA:  Nausea/vomiting. unwitnessed fall at facility. Was found face down with vomit around her. EXAM: CT ABDOMEN AND PELVIS WITHOUT CONTRAST TECHNIQUE: Multidetector CT imaging of the abdomen and pelvis was performed following the standard protocol without IV contrast. RADIATION DOSE REDUCTION: This exam was performed according to the departmental dose-optimization program which includes automated exposure control, adjustment of the mA and/or kV according to patient size and/or use of iterative reconstruction technique. COMPARISON:  None. FINDINGS: Slightly limited evaluation due to motion artifact. Lower chest: No acute abnormality.  Tiny hiatal hernia. Liver: Mildly enlarged measuring up to 18 cm.  No focal lesion. Biliary System: Status post cholecystectomy. Enlarged common bile duct measuring up to 1.4 cm with associated intraluminal hyperdensities measuring approximately and 0.70.8 cm. Pancreas: Diffusely atrophic. No focal lesion. Query slight fat stranding along the proximal pancreas (2:31). No main pancreatic ductal dilatation. Spleen: Not enlarged. No focal lesion. Adrenal Glands: No nodularity bilaterally. Kidneys: No hydroureteronephrosis.  No nephroureterolithiasis. No contour deforming renal mass. Anterior urinary bladder diverticula. Otherwise the urinary bladder is unremarkable. Bowel: No small or large bowel wall thickening or dilatation. Status post appendectomy. Mesentery, Omentum, and Peritoneum: No simple free fluid ascites. No pneumoperitoneum. No mesenteric hematoma identified. No organized fluid collection. Pelvic Organs: Status post hysterectomy. Bilateral adnexal regions are unremarkable. Lymph Nodes: No abdominal, pelvic, inguinal lymphadenopathy. Vasculature: Severe atherosclerotic plaque. No abdominal aorta or iliac aneurysm. Musculoskeletal: No significant soft tissue hematoma. Small to moderate umbilical hernia containing fat with abdominal wall defect of 3.3 cm. No acute pelvic fracture. No spinal fracture. Multilevel degenerative changes spine. IMPRESSION: 1. Findings suggestive of choledocholithiasis in a patient status post cholecystectomy. Correlate with liver function tests. 2. Query slight fat stranding along the proximal pancreas with limited evaluation due to motion artifact. Correlate with lipase levels. 3. No acute traumatic injury to the abdomen or pelvis on this noncontrast study. 4. No acute fracture or traumatic malalignment of the lumbar spine. Other imaging findings of potential clinical significance: 1. Small hiatal hernia. 2. Fat containing umbilical hernia. A findings suggestive ischemia or bowel obstruction. 3. Aortic Atherosclerosis (ICD10-I70.0). 4. Status post cholecystectomy, appendectomy, hysterectomy. Electronically Signed   By: Tish Frederickson M.D.   On: 09/12/2021 23:37   CT Head Wo Contrast  Result Date: 09/12/2021 CLINICAL DATA:  Head trauma, minor (Age >= 65y); Polytrauma, blunt. Unwitnessed fall EXAM: CT HEAD WITHOUT CONTRAST CT CERVICAL SPINE WITHOUT CONTRAST TECHNIQUE: Multidetector CT imaging of the head and cervical spine was performed following the standard protocol without intravenous  contrast. Multiplanar CT image reconstructions of the cervical spine were also generated. RADIATION DOSE REDUCTION: This exam was performed according to the departmental dose-optimization program which includes automated exposure control, adjustment of the mA and/or kV according to patient size and/or use of iterative reconstruction technique. COMPARISON:  None. FINDINGS: CT HEAD FINDINGS BRAIN: BRAIN Cerebral ventricle sizes are concordant with the degree of cerebral volume loss. Patchy  and confluent areas of decreased attenuation are noted throughout the deep and periventricular white matter of the cerebral hemispheres bilaterally, compatible with chronic microvascular ischemic disease. No evidence of large-territorial acute infarction. No parenchymal hemorrhage. No mass lesion. No extra-axial collection. No mass effect or midline shift. No hydrocephalus. Basilar cisterns are patent. Vascular: No hyperdense vessel. Atherosclerotic calcifications are present within the cavernous internal carotid arteries. Skull: No acute fracture or focal lesion. Sinuses/Orbits: Trace mucosal thickening of the right maxillary sinus. Otherwise paranasal sinuses and mastoid air cells are clear. Bilateral lens replacement. Otherwise the orbits are unremarkable. Other: None. CT CERVICAL SPINE FINDINGS Alignment: Normal. Skull base and vertebrae: Multilevel moderate degenerative changes spine. Severe osseous neural foraminal stenosis at the C4-C5 level. No acute fracture. No aggressive appearing focal osseous lesion or focal pathologic process. Soft tissues and spinal canal: No prevertebral fluid or swelling. No visible canal hematoma. Upper chest: Unremarkable. Other: None. IMPRESSION: 1. No acute intracranial abnormality. 2. No acute displaced fracture or traumatic listhesis of the cervical spine. Electronically Signed   By: Tish Frederickson M.D.   On: 09/12/2021 23:25   CT Cervical Spine Wo Contrast  Result Date:  09/12/2021 CLINICAL DATA:  Head trauma, minor (Age >= 65y); Polytrauma, blunt. Unwitnessed fall EXAM: CT HEAD WITHOUT CONTRAST CT CERVICAL SPINE WITHOUT CONTRAST TECHNIQUE: Multidetector CT imaging of the head and cervical spine was performed following the standard protocol without intravenous contrast. Multiplanar CT image reconstructions of the cervical spine were also generated. RADIATION DOSE REDUCTION: This exam was performed according to the departmental dose-optimization program which includes automated exposure control, adjustment of the mA and/or kV according to patient size and/or use of iterative reconstruction technique. COMPARISON:  None. FINDINGS: CT HEAD FINDINGS BRAIN: BRAIN Cerebral ventricle sizes are concordant with the degree of cerebral volume loss. Patchy and confluent areas of decreased attenuation are noted throughout the deep and periventricular white matter of the cerebral hemispheres bilaterally, compatible with chronic microvascular ischemic disease. No evidence of large-territorial acute infarction. No parenchymal hemorrhage. No mass lesion. No extra-axial collection. No mass effect or midline shift. No hydrocephalus. Basilar cisterns are patent. Vascular: No hyperdense vessel. Atherosclerotic calcifications are present within the cavernous internal carotid arteries. Skull: No acute fracture or focal lesion. Sinuses/Orbits: Trace mucosal thickening of the right maxillary sinus. Otherwise paranasal sinuses and mastoid air cells are clear. Bilateral lens replacement. Otherwise the orbits are unremarkable. Other: None. CT CERVICAL SPINE FINDINGS Alignment: Normal. Skull base and vertebrae: Multilevel moderate degenerative changes spine. Severe osseous neural foraminal stenosis at the C4-C5 level. No acute fracture. No aggressive appearing focal osseous lesion or focal pathologic process. Soft tissues and spinal canal: No prevertebral fluid or swelling. No visible canal hematoma. Upper chest:  Unremarkable. Other: None. IMPRESSION: 1. No acute intracranial abnormality. 2. No acute displaced fracture or traumatic listhesis of the cervical spine. Electronically Signed   By: Tish Frederickson M.D.   On: 09/12/2021 23:25   DG Chest Port 1 View  Result Date: 09/12/2021 CLINICAL DATA:  Fever unwitnessed fall EXAM: PORTABLE CHEST 1 VIEW COMPARISON:  05/19/2017 FINDINGS: The heart size and mediastinal contours are within normal limits. Aortic atherosclerosis. Both lungs are clear. The visualized skeletal structures are unremarkable. IMPRESSION: No active disease. Electronically Signed   By: Jasmine Pang M.D.   On: 09/12/2021 22:15    Assessment/Plan  Primary hypertension Assessment & Plan: 138/70  Cont with amlodipine Monitor for dizziness   Gastroesophageal reflux disease, unspecified whether esophagitis present Assessment & Plan: Denies acid  reflux, dark or bloody stools Cont with omeprazole   Nausea and vomiting, unspecified vomiting type Assessment & Plan: Pt denies nausea,  Cont with zofran prn   Senile dementia [facility]) Assessment & Plan: MMSE 19/30  02/2023  Cont with supportive care Increase cognitively engaging activities Cont with namenda Functional Assessment Staging Scale: 6d - Urinary incontinence occasionally or more frequently over the past weeks.       Family/ staff Communication:   care plan discussed with the nursing staff  Labs/tests ordered: labs ordered 30  Total time spent for obtaining history,  performing a medically appropriate examination and evaluation, reviewing the tests, , ordering   tests,   , documenting clinical information in the electronic or other health record, independently interpreting results and communicating results to the patient/family/caregiver, care coordination (not separately reported)

## 2023-05-02 NOTE — Assessment & Plan Note (Signed)
Denies acid reflux, dark or bloody stools Cont with omeprazole

## 2023-05-02 NOTE — Assessment & Plan Note (Signed)
Pt denies nausea,  Cont with zofran prn

## 2023-05-02 NOTE — Assessment & Plan Note (Signed)
138/70  Cont with amlodipine Monitor for dizziness

## 2023-05-03 DIAGNOSIS — M5459 Other low back pain: Secondary | ICD-10-CM | POA: Diagnosis not present

## 2023-05-03 DIAGNOSIS — R2681 Unsteadiness on feet: Secondary | ICD-10-CM | POA: Diagnosis not present

## 2023-05-05 DIAGNOSIS — M5459 Other low back pain: Secondary | ICD-10-CM | POA: Diagnosis not present

## 2023-05-05 DIAGNOSIS — R2681 Unsteadiness on feet: Secondary | ICD-10-CM | POA: Diagnosis not present

## 2023-05-09 DIAGNOSIS — N3946 Mixed incontinence: Secondary | ICD-10-CM | POA: Diagnosis not present

## 2023-05-09 DIAGNOSIS — M6281 Muscle weakness (generalized): Secondary | ICD-10-CM | POA: Diagnosis not present

## 2023-05-09 DIAGNOSIS — R41841 Cognitive communication deficit: Secondary | ICD-10-CM | POA: Diagnosis not present

## 2023-05-09 DIAGNOSIS — R2681 Unsteadiness on feet: Secondary | ICD-10-CM | POA: Diagnosis not present

## 2023-05-09 DIAGNOSIS — M5459 Other low back pain: Secondary | ICD-10-CM | POA: Diagnosis not present

## 2023-05-10 DIAGNOSIS — R41841 Cognitive communication deficit: Secondary | ICD-10-CM | POA: Diagnosis not present

## 2023-05-10 DIAGNOSIS — R2681 Unsteadiness on feet: Secondary | ICD-10-CM | POA: Diagnosis not present

## 2023-05-10 DIAGNOSIS — D649 Anemia, unspecified: Secondary | ICD-10-CM | POA: Diagnosis not present

## 2023-05-10 DIAGNOSIS — N183 Chronic kidney disease, stage 3 unspecified: Secondary | ICD-10-CM | POA: Diagnosis not present

## 2023-05-10 DIAGNOSIS — N3946 Mixed incontinence: Secondary | ICD-10-CM | POA: Diagnosis not present

## 2023-05-10 DIAGNOSIS — M6281 Muscle weakness (generalized): Secondary | ICD-10-CM | POA: Diagnosis not present

## 2023-05-10 DIAGNOSIS — M5459 Other low back pain: Secondary | ICD-10-CM | POA: Diagnosis not present

## 2023-05-10 LAB — HEPATIC FUNCTION PANEL
ALT: 8 U/L (ref 7–35)
AST: 14 (ref 13–35)
Alkaline Phosphatase: 96 (ref 25–125)
Bilirubin, Total: 0.9

## 2023-05-10 LAB — BASIC METABOLIC PANEL
BUN: 21 (ref 4–21)
CO2: 26 — AB (ref 13–22)
Chloride: 104 (ref 99–108)
Creatinine: 1.3 — AB (ref 0.5–1.1)
Glucose: 103
Potassium: 4.2 meq/L (ref 3.5–5.1)
Sodium: 140 (ref 137–147)

## 2023-05-10 LAB — CBC AND DIFFERENTIAL
HCT: 43 (ref 36–46)
Hemoglobin: 14.1 (ref 12.0–16.0)
Neutrophils Absolute: 5013
Platelets: 250 10*3/uL (ref 150–400)
WBC: 8.3

## 2023-05-10 LAB — COMPREHENSIVE METABOLIC PANEL
Albumin: 6.2 — AB (ref 3.5–5.0)
Calcium: 9.5 (ref 8.7–10.7)
Globulin: 2.1

## 2023-05-10 LAB — VITAMIN D 25 HYDROXY (VIT D DEFICIENCY, FRACTURES): Vit D, 25-Hydroxy: 42

## 2023-05-10 LAB — CBC: RBC: 4.21 (ref 3.87–5.11)

## 2023-05-12 DIAGNOSIS — M5459 Other low back pain: Secondary | ICD-10-CM | POA: Diagnosis not present

## 2023-05-12 DIAGNOSIS — R2681 Unsteadiness on feet: Secondary | ICD-10-CM | POA: Diagnosis not present

## 2023-05-12 DIAGNOSIS — R41841 Cognitive communication deficit: Secondary | ICD-10-CM | POA: Diagnosis not present

## 2023-05-12 DIAGNOSIS — M6281 Muscle weakness (generalized): Secondary | ICD-10-CM | POA: Diagnosis not present

## 2023-05-12 DIAGNOSIS — N3946 Mixed incontinence: Secondary | ICD-10-CM | POA: Diagnosis not present

## 2023-05-16 DIAGNOSIS — R2681 Unsteadiness on feet: Secondary | ICD-10-CM | POA: Diagnosis not present

## 2023-05-16 DIAGNOSIS — N3946 Mixed incontinence: Secondary | ICD-10-CM | POA: Diagnosis not present

## 2023-05-16 DIAGNOSIS — M6281 Muscle weakness (generalized): Secondary | ICD-10-CM | POA: Diagnosis not present

## 2023-05-16 DIAGNOSIS — M5459 Other low back pain: Secondary | ICD-10-CM | POA: Diagnosis not present

## 2023-05-16 DIAGNOSIS — R41841 Cognitive communication deficit: Secondary | ICD-10-CM | POA: Diagnosis not present

## 2023-05-17 DIAGNOSIS — R41841 Cognitive communication deficit: Secondary | ICD-10-CM | POA: Diagnosis not present

## 2023-05-17 DIAGNOSIS — M6281 Muscle weakness (generalized): Secondary | ICD-10-CM | POA: Diagnosis not present

## 2023-05-17 DIAGNOSIS — M5459 Other low back pain: Secondary | ICD-10-CM | POA: Diagnosis not present

## 2023-05-17 DIAGNOSIS — N3946 Mixed incontinence: Secondary | ICD-10-CM | POA: Diagnosis not present

## 2023-05-17 DIAGNOSIS — R2681 Unsteadiness on feet: Secondary | ICD-10-CM | POA: Diagnosis not present

## 2023-05-19 DIAGNOSIS — N3946 Mixed incontinence: Secondary | ICD-10-CM | POA: Diagnosis not present

## 2023-05-19 DIAGNOSIS — R2681 Unsteadiness on feet: Secondary | ICD-10-CM | POA: Diagnosis not present

## 2023-05-19 DIAGNOSIS — M5459 Other low back pain: Secondary | ICD-10-CM | POA: Diagnosis not present

## 2023-05-19 DIAGNOSIS — R41841 Cognitive communication deficit: Secondary | ICD-10-CM | POA: Diagnosis not present

## 2023-05-19 DIAGNOSIS — M6281 Muscle weakness (generalized): Secondary | ICD-10-CM | POA: Diagnosis not present

## 2023-05-20 DIAGNOSIS — M5459 Other low back pain: Secondary | ICD-10-CM | POA: Diagnosis not present

## 2023-05-20 DIAGNOSIS — R41841 Cognitive communication deficit: Secondary | ICD-10-CM | POA: Diagnosis not present

## 2023-05-20 DIAGNOSIS — N3946 Mixed incontinence: Secondary | ICD-10-CM | POA: Diagnosis not present

## 2023-05-20 DIAGNOSIS — R2681 Unsteadiness on feet: Secondary | ICD-10-CM | POA: Diagnosis not present

## 2023-05-20 DIAGNOSIS — M6281 Muscle weakness (generalized): Secondary | ICD-10-CM | POA: Diagnosis not present

## 2023-05-23 ENCOUNTER — Encounter: Payer: Self-pay | Admitting: Nurse Practitioner

## 2023-05-23 ENCOUNTER — Non-Acute Institutional Stay: Payer: Self-pay | Admitting: Nurse Practitioner

## 2023-05-23 DIAGNOSIS — K219 Gastro-esophageal reflux disease without esophagitis: Secondary | ICD-10-CM

## 2023-05-23 DIAGNOSIS — N1831 Chronic kidney disease, stage 3a: Secondary | ICD-10-CM

## 2023-05-23 DIAGNOSIS — R053 Chronic cough: Secondary | ICD-10-CM

## 2023-05-23 DIAGNOSIS — E785 Hyperlipidemia, unspecified: Secondary | ICD-10-CM

## 2023-05-23 DIAGNOSIS — R41841 Cognitive communication deficit: Secondary | ICD-10-CM | POA: Diagnosis not present

## 2023-05-23 DIAGNOSIS — M5459 Other low back pain: Secondary | ICD-10-CM | POA: Diagnosis not present

## 2023-05-23 DIAGNOSIS — I10 Essential (primary) hypertension: Secondary | ICD-10-CM | POA: Diagnosis not present

## 2023-05-23 DIAGNOSIS — R2681 Unsteadiness on feet: Secondary | ICD-10-CM | POA: Diagnosis not present

## 2023-05-23 DIAGNOSIS — K2900 Acute gastritis without bleeding: Secondary | ICD-10-CM

## 2023-05-23 DIAGNOSIS — I872 Venous insufficiency (chronic) (peripheral): Secondary | ICD-10-CM | POA: Diagnosis not present

## 2023-05-23 DIAGNOSIS — M545 Low back pain, unspecified: Secondary | ICD-10-CM | POA: Diagnosis not present

## 2023-05-23 DIAGNOSIS — F0390 Unspecified dementia without behavioral disturbance: Secondary | ICD-10-CM | POA: Diagnosis not present

## 2023-05-23 DIAGNOSIS — M6281 Muscle weakness (generalized): Secondary | ICD-10-CM | POA: Diagnosis not present

## 2023-05-23 DIAGNOSIS — N3946 Mixed incontinence: Secondary | ICD-10-CM | POA: Diagnosis not present

## 2023-05-23 NOTE — Assessment & Plan Note (Signed)
Bun/creat 17/1.34 04/05/23

## 2023-05-23 NOTE — Assessment & Plan Note (Signed)
LDL 77 06/18/21, off Rosuvastatin 

## 2023-05-23 NOTE — Assessment & Plan Note (Signed)
Vit B12 228 04/03/20, added Vit B 216/22, off Fe, Hgb 14.3 04/05/23

## 2023-05-23 NOTE — Assessment & Plan Note (Signed)
Chronic cough, on Benzonatate

## 2023-05-23 NOTE — Progress Notes (Signed)
Location:   [facility] Room Number: 804 Place of Service:  ALF (13) Provider: Arna Snipe Olga Seyler NP  Venita Sheffield, MD  Patient Care Team: Venita Sheffield, MD as PCP - General (Internal Medicine) Runell Gess, MD as Consulting Physician (Cardiology)  Extended Emergency Contact Information Primary Emergency Contact: Elita Quick Address: [address]          [place], [place] [address] [place] Home Phone: [phone] Mobile Phone: [phone] Relation: Niece  Code Status: DNR Goals of care: Advanced Directive information    07/20/2022    2:31 PM  Advanced Directives  Does Patient Have a Medical Advance Directive? Yes  Type of Estate agent of [place];Out of facility DNR (pink MOST or yellow form)  Does patient want to make changes to medical advance directive? No - Patient declined  Copy of Healthcare Power of Attorney in Chart? Yes - validated most recent copy scanned in chart (See row information)     Chief Complaint  Patient presents with   Acute Visit    Nausea, vomiting, diarrhea    HPI:  Pt is a 89 y.o. female seen today for an acute visit for nausea, vomiting, diarrhea x 1 day, afebrile, denied abd pain.      Senile dementia, MMSE 19/30 02/22/23, tolerated Namenda well, the patient known for waling around the facility and picking up objects. TSH 3.4 04/05/23             Lower back pain, mild, ambulates with walker, X-ray 02/11/23 thoracic, lumbar spine no compression deformities, mild chronic compression fx lf L1(10%)               Weigh stable, off  Mirtazapine 07/02/22 09/12/21 choledocholithiasis/ascending cholangitis s/p ERCP and stone removal. Severe sepsis/E coli bacteremia- fully treated.              Chronic cough, on Benzonatate              CKD, stage 3, stable, Bun/creat 17/1.34 04/05/23             GERD, better indigestion, burping, on Omeprazole. prn Maalox, Hgb 14.3 04/05/23              Edema BLE, off Furosemide, not new,  EF 50-55% 06/01/2017.             HTN, takes Amlodipine             Hyperlipidemia, LDL 77 06/18/21, off Rosuvastatin             Anemia,  Vit B12 228 04/03/20, added Vit B 216/22, off Fe, Hgb 14.3 04/05/23             Osteopenia,  DEXA 07/15/22 t score -2.275, on Ca, Vit D        Past Medical History:  Diagnosis Date   Arthritis    "right thumb" (01/29/2015)   Chronic shoulder pain    "between my shoulders" (01/29/2015)   Dizziness    Esophageal stricture 2009   Gallstones    GERD (gastroesophageal reflux disease) 2004   Hemorrhoids 2004   Hiatal hernia 2004   History of blood transfusion 1947   "appendix ruptured"   HOH (hard of hearing)    Hypertension    Rhinitis, allergic    Syncopal episodes    Past Surgical History:  Procedure Laterality Date   APPENDECTOMY  1947   "ruptured"   CARDIAC CATHETERIZATION  10/23/1999; 09/2014   EF 60%  No significant CAD; patent coronary arteries.   CARPAL TUNNEL RELEASE Right 1990's   CHOLECYSTECTOMY N/A 01/29/2015   Procedure: LAPAROSCOPIC CHOLECYSTECTOMY;  Surgeon: Emelia Loron, MD;  Location: [facility] OR;  Service: General;  Laterality: N/A;   DILATION AND CURETTAGE OF UTERUS  "couple"   ERCP N/A 09/13/2021   Procedure: ENDOSCOPIC RETROGRADE CHOLANGIOPANCREATOGRAPHY (ERCP);  Surgeon: Jeani Hawking, MD;  Location: [facility];  Service: Gastroenterology;  Laterality: N/A;   ESOPHAGOGASTRODUODENOSCOPY (EGD) WITH ESOPHAGEAL DILATION  2-3 times   HEMORROIDECTOMY     KNEE ARTHROSCOPY Bilateral    right x 2   LAPAROSCOPIC CHOLECYSTECTOMY  01/29/2015   LEFT HEART CATHETERIZATION WITH CORONARY ANGIOGRAM N/A 09/16/2014   Procedure: LEFT HEART CATHETERIZATION WITH CORONARY ANGIOGRAM;  Surgeon: Runell Gess, MD;  Location[facility] CATH LAB;  Service: Cardiovascular;  Laterality: N/A;   NM MYOVIEW LTD  06/01/2012   EF 71%   REMOVAL OF STONES  09/13/2021   Procedure: REMOVAL OF STONES;  Surgeon: Jeani Hawking, MD;   Location: [facility];  Service: Gastroenterology;;   Dennison Mascot  09/13/2021   Procedure: Dennison Mascot;  Surgeon: Jeani Hawking, MD;  Location: [facility];  Service: Gastroenterology;;   VAGINAL HYSTERECTOMY  1970's    Allergies  Allergen Reactions   Contrast Media [Iodinated Contrast Media] Itching and Rash    Pt covered in rash, red.   Codeine Nausea And Vomiting    in large doses: can tolerate Tussionex    Allergies as of 05/23/2023       Reactions   Contrast Media [iodinated Contrast Media] Itching, Rash   Pt covered in rash, red.   Codeine Nausea And Vomiting   in large doses: can tolerate Tussionex        Medication List        Accurate as of May 23, 2023 11:59 PM. If you have any questions, ask your nurse or doctor.          acetaminophen 325 MG tablet Commonly known as: TYLENOL Take 650 mg by mouth every 6 (six) hours as needed for mild pain.   alum & mag hydroxide-simeth 400-400-40 MG/5ML suspension Commonly known as: MAALOX PLUS Take 10 mLs by mouth every 6 (six) hours as needed for indigestion or heartburn.   amLODipine 5 MG tablet Commonly known as: NORVASC Take 1 tablet (5 mg total) by mouth daily.   benzonatate 100 MG capsule Commonly known as: TESSALON Take 1 capsule by mouth 3 (three) times daily.   cyanocobalamin 1000 MCG tablet Commonly known as: VITAMIN B12 Take 1,000 mcg by mouth daily.   lidocaine 4 % Place 1 patch onto the skin every 12 (twelve) hours as needed (back pain). Remove & Discard patch within 12 hours or as directed by MD   memantine 10 MG tablet Commonly known as: NAMENDA Take 10 mg by mouth 2 (two) times daily.   omeprazole 40 MG capsule Commonly known as: PRILOSEC Take 40 mg by mouth daily.   ondansetron 4 MG disintegrating tablet Commonly known as: ZOFRAN-ODT Take 4 mg by mouth every 6 (six) hours as needed for nausea or vomiting.   Resource 2.0 Liqd Take by mouth. 180 ml; oral Twice A Day Special  Instructions: administer with med pass        Review of Systems  Constitutional:  Negative for appetite change, fatigue and fever.  HENT:  Positive for hearing loss. Negative for congestion, mouth sores, sore throat and trouble swallowing.   Respiratory:  Positive for cough. Negative for chest tightness  and shortness of breath.        Baseline cough.   Cardiovascular:  Positive for leg swelling.  Gastrointestinal:  Positive for diarrhea, nausea and vomiting. Negative for abdominal pain.  Genitourinary:  Positive for frequency. Negative for dysuria and urgency.       Baseline urinary frequency  Musculoskeletal:  Positive for back pain and gait problem.  Skin:  Negative for color change.  Neurological:  Negative for speech difficulty, weakness and light-headedness.       Memory lapses.   Psychiatric/Behavioral:  Negative for behavioral problems and sleep disturbance. The patient is not nervous/anxious.     Immunization History  Administered Date(s) Administered   Fluad Quad(high Dose 65+) 03/29/2022   Influenza Split 03/16/2011, 03/01/2012   Influenza Whole 03/28/2007, 04/01/2009, 02/18/2010   Influenza, High Dose Seasonal PF 03/14/2013   Influenza,inj,Quad PF,6+ Mos 03/20/2014, 03/11/2015   Influenza-Unspecified 08/06/2018, 03/20/2020   Moderna SARS-COV2 Booster Vaccination 11/04/2020   Moderna Sars-Covid-2 Vaccination 06/11/2019, 07/09/2019, 02/24/2021   PPD Test 04/23/2020, 11/03/2021   Pfizer Covid-19 Vaccine Bivalent Booster 76yrs & up 04/08/2022   Pneumococcal Conjugate-13 02/25/2015   Pneumococcal Polysaccharide-23 03/28/2007   Td 12/20/2005   Pertinent  Health Maintenance Due  Topic Date Due   MAMMOGRAM  09/15/2013   INFLUENZA VACCINE  01/06/2023   DEXA SCAN  Completed      09/15/2021    3:00 PM 09/15/2021    8:00 PM 09/16/2021   10:06 AM 07/02/2022    9:42 AM 07/06/2022    1:44 PM  Fall Risk  Falls in the past year?    0 0  Was there an injury with Fall?    0 0   Fall Risk Category Calculator    0 0  (RETIRED) Patient Fall Risk Level High fall risk High fall risk High fall risk    Patient at Risk for Falls Due to    No Fall Risks No Fall Risks  Fall risk Follow up    Falls evaluation completed Falls evaluation completed   Functional Status Survey:    Vitals:   05/23/23 1115  BP: (!) 164/66  Pulse: 95  Resp: 20  Temp: (!) 97.1 F (36.2 C)  SpO2: 96%  Weight: 194 lb 6.4 oz (88.2 kg)   Body mass index is 37.97 kg/m. Physical Exam Constitutional:      Appearance: Normal appearance.  HENT:     Head: Normocephalic and atraumatic.     Mouth/Throat:     Mouth: Mucous membranes are moist.     Pharynx: No oropharyngeal exudate or posterior oropharyngeal erythema.  Eyes:     Extraocular Movements: Extraocular movements intact.     Conjunctiva/sclera: Conjunctivae normal.     Pupils: Pupils are equal, round, and reactive to light.  Cardiovascular:     Rate and Rhythm: Normal rate and regular rhythm.     Heart sounds: No murmur heard. Pulmonary:     Effort: Pulmonary effort is normal.     Breath sounds: No rales.  Abdominal:     General: Bowel sounds are normal.     Palpations: Abdomen is soft.     Tenderness: There is no abdominal tenderness. There is no right CVA tenderness, left CVA tenderness, guarding or rebound.  Musculoskeletal:        General: Tenderness present.     Cervical back: Normal range of motion and neck supple.     Right lower leg: Edema present.     Left lower leg: Edema present.  Comments: BLE chronic venous insufficiency skin changes. Trace edema BLE.  Lower back pain about T2/L1 region, no spinal spinous process tenderness noted.   Skin:    General: Skin is warm and dry.  Neurological:     General: No focal deficit present.     Mental Status: She is alert. Mental status is at baseline.     Gait: Gait abnormal.     Comments: Oriented to person, her room on unit. Habitual picking up objects when walking  around facility.   Psychiatric:        Mood and Affect: Mood normal.        Behavior: Behavior normal.     Labs reviewed: Recent Labs    07/20/22 0655  NA 140  K 4.3  CL 105  CO2 28*  BUN 22*  CREATININE 1.2*  CALCIUM 9.0   Recent Labs    07/20/22 0655  AST 13  ALT 9  ALKPHOS 73  ALBUMIN 3.4*   Recent Labs    07/20/22 0655  WBC 7.7  NEUTROABS 3,704.00  HGB 12.7  HCT 37  PLT 233   Lab Results  Component Value Date   TSH 1.313 09/13/2021   Lab Results  Component Value Date   HGBA1C 5.8 07/11/2017   Lab Results  Component Value Date   CHOL 136 06/18/2021   HDL 34 (A) 06/18/2021   LDLCALC 77 06/18/2021   LDLDIRECT 161.2 07/18/2013   TRIG 151 06/18/2021   CHOLHDL 2.9 04/03/2020    Significant Diagnostic Results in last 30 days:  No results found.  Assessment/Plan: Acute gastritis nausea, vomiting, diarrhea x 1 day, afebrile, denied abd pain.  COVID test CBC/diff, CMP/eGFR, C-diff Toxin A/B x3, lipase Zofran 4mg  q6hr prn x 72 hrs. Monitor VS  Senile dementia (HCC)  MMSE 19/30 02/22/23, tolerated Namenda well, the patient known for waling around the facility and picking up objects.  Backache Chronic, mild, ambulates with walker, X-ray 02/11/23 thoracic, lumbar spine no compression deformities, mild chronic compression fx lf L1(10%)  COUGH, CHRONIC   Chronic cough, on Benzonatate   CKD (chronic kidney disease) stage 3, GFR 30-59 ml/min (HCC) Bun/creat 17/1.34 04/05/23  GERD (gastroesophageal reflux disease)  better indigestion, burping, on Omeprazole. prn Maalox, Hgb 14.3 04/05/23  Edema of both lower extremities due to peripheral venous insufficiency Minimal swelling BLE, off Furosemide, not new,  EF 50-55% 06/01/2017.  Hypertension Blood pressure is controlled,  takes Amlodipine  Hyperlipidemia  LDL 77 06/18/21, off Rosuvastatin  Anemia, iron deficiency  Vit B12 228 04/03/20, added Vit B 216/22, off Fe, Hgb 14.3 04/05/23    Family/  staff Communication: plan of care reviewed with the patient and charge nurse.   Labs/tests ordered: CBC/diff, CMP/eGFR, lipase, C-diff toxin A/B x3  Time spend 40 minutes.

## 2023-05-23 NOTE — Assessment & Plan Note (Signed)
Blood pressure is controlled,  takes Amlodipine

## 2023-05-23 NOTE — Assessment & Plan Note (Signed)
Minimal swelling BLE, off Furosemide, not new,  EF 50-55% 06/01/2017.

## 2023-05-23 NOTE — Assessment & Plan Note (Addendum)
nausea, vomiting, diarrhea x 1 day, afebrile, denied abd pain.  COVID test CBC/diff, CMP/eGFR, C-diff Toxin A/B x3, lipase Zofran 4mg  q6hr prn x 72 hrs. Monitor VS

## 2023-05-23 NOTE — Assessment & Plan Note (Signed)
better indigestion, burping, on Omeprazole. prn Maalox, Hgb 14.3 04/05/23

## 2023-05-23 NOTE — Assessment & Plan Note (Signed)
Chronic, mild, ambulates with walker, X-ray 02/11/23 thoracic, lumbar spine no compression deformities, mild chronic compression fx lf L1(10%)

## 2023-05-23 NOTE — Assessment & Plan Note (Signed)
MMSE 19/30 02/22/23, tolerated Namenda well, the patient known for waling around the facility and picking up objects.

## 2023-05-24 DIAGNOSIS — F419 Anxiety disorder, unspecified: Secondary | ICD-10-CM | POA: Diagnosis not present

## 2023-05-24 DIAGNOSIS — N183 Chronic kidney disease, stage 3 unspecified: Secondary | ICD-10-CM | POA: Diagnosis not present

## 2023-05-24 LAB — HEPATIC FUNCTION PANEL
ALT: 8 U/L (ref 7–35)
AST: 12 — AB (ref 13–35)
Alkaline Phosphatase: 82 (ref 25–125)
Bilirubin, Total: 0.8

## 2023-05-24 LAB — CBC AND DIFFERENTIAL
HCT: 40 (ref 36–46)
Hemoglobin: 12.9 (ref 12.0–16.0)
Neutrophils Absolute: 4703
Platelets: 209 10*3/uL (ref 150–400)
WBC: 7.8

## 2023-05-24 LAB — COMPREHENSIVE METABOLIC PANEL
Albumin: 3.4 — AB (ref 3.5–5.0)
Calcium: 8.6 — AB (ref 8.7–10.7)
Globulin: 1.8

## 2023-05-24 LAB — CBC: RBC: 3.95 (ref 3.87–5.11)

## 2023-05-24 LAB — BASIC METABOLIC PANEL
BUN: 30 — AB (ref 4–21)
CO2: 27 — AB (ref 13–22)
Chloride: 106 (ref 99–108)
Creatinine: 1.5 — AB (ref 0.5–1.1)
Glucose: 90
Potassium: 4 meq/L (ref 3.5–5.1)
Sodium: 139 (ref 137–147)

## 2023-05-25 DIAGNOSIS — R41841 Cognitive communication deficit: Secondary | ICD-10-CM | POA: Diagnosis not present

## 2023-05-25 DIAGNOSIS — N3946 Mixed incontinence: Secondary | ICD-10-CM | POA: Diagnosis not present

## 2023-05-25 DIAGNOSIS — R2681 Unsteadiness on feet: Secondary | ICD-10-CM | POA: Diagnosis not present

## 2023-05-25 DIAGNOSIS — M6281 Muscle weakness (generalized): Secondary | ICD-10-CM | POA: Diagnosis not present

## 2023-05-25 DIAGNOSIS — M5459 Other low back pain: Secondary | ICD-10-CM | POA: Diagnosis not present

## 2023-05-26 DIAGNOSIS — R41841 Cognitive communication deficit: Secondary | ICD-10-CM | POA: Diagnosis not present

## 2023-05-26 DIAGNOSIS — M5459 Other low back pain: Secondary | ICD-10-CM | POA: Diagnosis not present

## 2023-05-26 DIAGNOSIS — M6281 Muscle weakness (generalized): Secondary | ICD-10-CM | POA: Diagnosis not present

## 2023-05-26 DIAGNOSIS — N3946 Mixed incontinence: Secondary | ICD-10-CM | POA: Diagnosis not present

## 2023-05-26 DIAGNOSIS — R2681 Unsteadiness on feet: Secondary | ICD-10-CM | POA: Diagnosis not present

## 2023-05-29 DIAGNOSIS — R2681 Unsteadiness on feet: Secondary | ICD-10-CM | POA: Diagnosis not present

## 2023-05-29 DIAGNOSIS — N3946 Mixed incontinence: Secondary | ICD-10-CM | POA: Diagnosis not present

## 2023-05-29 DIAGNOSIS — M5459 Other low back pain: Secondary | ICD-10-CM | POA: Diagnosis not present

## 2023-05-29 DIAGNOSIS — M6281 Muscle weakness (generalized): Secondary | ICD-10-CM | POA: Diagnosis not present

## 2023-05-29 DIAGNOSIS — R41841 Cognitive communication deficit: Secondary | ICD-10-CM | POA: Diagnosis not present

## 2023-05-30 DIAGNOSIS — R2681 Unsteadiness on feet: Secondary | ICD-10-CM | POA: Diagnosis not present

## 2023-05-30 DIAGNOSIS — N3946 Mixed incontinence: Secondary | ICD-10-CM | POA: Diagnosis not present

## 2023-05-30 DIAGNOSIS — M5459 Other low back pain: Secondary | ICD-10-CM | POA: Diagnosis not present

## 2023-05-30 DIAGNOSIS — M6281 Muscle weakness (generalized): Secondary | ICD-10-CM | POA: Diagnosis not present

## 2023-05-30 DIAGNOSIS — R41841 Cognitive communication deficit: Secondary | ICD-10-CM | POA: Diagnosis not present

## 2023-05-31 DIAGNOSIS — M6281 Muscle weakness (generalized): Secondary | ICD-10-CM | POA: Diagnosis not present

## 2023-05-31 DIAGNOSIS — N3946 Mixed incontinence: Secondary | ICD-10-CM | POA: Diagnosis not present

## 2023-05-31 DIAGNOSIS — M5459 Other low back pain: Secondary | ICD-10-CM | POA: Diagnosis not present

## 2023-05-31 DIAGNOSIS — R2681 Unsteadiness on feet: Secondary | ICD-10-CM | POA: Diagnosis not present

## 2023-05-31 DIAGNOSIS — R41841 Cognitive communication deficit: Secondary | ICD-10-CM | POA: Diagnosis not present

## 2023-06-02 DIAGNOSIS — M6281 Muscle weakness (generalized): Secondary | ICD-10-CM | POA: Diagnosis not present

## 2023-06-02 DIAGNOSIS — N3946 Mixed incontinence: Secondary | ICD-10-CM | POA: Diagnosis not present

## 2023-06-02 DIAGNOSIS — R2681 Unsteadiness on feet: Secondary | ICD-10-CM | POA: Diagnosis not present

## 2023-06-02 DIAGNOSIS — M5459 Other low back pain: Secondary | ICD-10-CM | POA: Diagnosis not present

## 2023-06-02 DIAGNOSIS — R41841 Cognitive communication deficit: Secondary | ICD-10-CM | POA: Diagnosis not present

## 2023-06-03 DIAGNOSIS — N3946 Mixed incontinence: Secondary | ICD-10-CM | POA: Diagnosis not present

## 2023-06-03 DIAGNOSIS — R41841 Cognitive communication deficit: Secondary | ICD-10-CM | POA: Diagnosis not present

## 2023-06-03 DIAGNOSIS — M5459 Other low back pain: Secondary | ICD-10-CM | POA: Diagnosis not present

## 2023-06-03 DIAGNOSIS — R2681 Unsteadiness on feet: Secondary | ICD-10-CM | POA: Diagnosis not present

## 2023-06-03 DIAGNOSIS — M6281 Muscle weakness (generalized): Secondary | ICD-10-CM | POA: Diagnosis not present

## 2023-06-06 DIAGNOSIS — M6281 Muscle weakness (generalized): Secondary | ICD-10-CM | POA: Diagnosis not present

## 2023-06-06 DIAGNOSIS — R2681 Unsteadiness on feet: Secondary | ICD-10-CM | POA: Diagnosis not present

## 2023-06-06 DIAGNOSIS — R41841 Cognitive communication deficit: Secondary | ICD-10-CM | POA: Diagnosis not present

## 2023-06-06 DIAGNOSIS — M5459 Other low back pain: Secondary | ICD-10-CM | POA: Diagnosis not present

## 2023-06-06 DIAGNOSIS — N3946 Mixed incontinence: Secondary | ICD-10-CM | POA: Diagnosis not present

## 2023-06-07 DIAGNOSIS — R2681 Unsteadiness on feet: Secondary | ICD-10-CM | POA: Diagnosis not present

## 2023-06-07 DIAGNOSIS — R41841 Cognitive communication deficit: Secondary | ICD-10-CM | POA: Diagnosis not present

## 2023-06-07 DIAGNOSIS — N3946 Mixed incontinence: Secondary | ICD-10-CM | POA: Diagnosis not present

## 2023-06-07 DIAGNOSIS — M5459 Other low back pain: Secondary | ICD-10-CM | POA: Diagnosis not present

## 2023-06-07 DIAGNOSIS — M6281 Muscle weakness (generalized): Secondary | ICD-10-CM | POA: Diagnosis not present

## 2023-06-08 DIAGNOSIS — R41841 Cognitive communication deficit: Secondary | ICD-10-CM | POA: Diagnosis not present

## 2023-06-08 DIAGNOSIS — M6281 Muscle weakness (generalized): Secondary | ICD-10-CM | POA: Diagnosis not present

## 2023-06-08 DIAGNOSIS — N3946 Mixed incontinence: Secondary | ICD-10-CM | POA: Diagnosis not present

## 2023-06-10 DIAGNOSIS — R41841 Cognitive communication deficit: Secondary | ICD-10-CM | POA: Diagnosis not present

## 2023-06-10 DIAGNOSIS — M6281 Muscle weakness (generalized): Secondary | ICD-10-CM | POA: Diagnosis not present

## 2023-06-10 DIAGNOSIS — N3946 Mixed incontinence: Secondary | ICD-10-CM | POA: Diagnosis not present

## 2023-06-11 DIAGNOSIS — R41841 Cognitive communication deficit: Secondary | ICD-10-CM | POA: Diagnosis not present

## 2023-06-11 DIAGNOSIS — M6281 Muscle weakness (generalized): Secondary | ICD-10-CM | POA: Diagnosis not present

## 2023-06-11 DIAGNOSIS — N3946 Mixed incontinence: Secondary | ICD-10-CM | POA: Diagnosis not present

## 2023-06-13 DIAGNOSIS — M6281 Muscle weakness (generalized): Secondary | ICD-10-CM | POA: Diagnosis not present

## 2023-06-13 DIAGNOSIS — R41841 Cognitive communication deficit: Secondary | ICD-10-CM | POA: Diagnosis not present

## 2023-06-13 DIAGNOSIS — N3946 Mixed incontinence: Secondary | ICD-10-CM | POA: Diagnosis not present

## 2023-06-15 DIAGNOSIS — R41841 Cognitive communication deficit: Secondary | ICD-10-CM | POA: Diagnosis not present

## 2023-06-15 DIAGNOSIS — N3946 Mixed incontinence: Secondary | ICD-10-CM | POA: Diagnosis not present

## 2023-06-15 DIAGNOSIS — M6281 Muscle weakness (generalized): Secondary | ICD-10-CM | POA: Diagnosis not present

## 2023-06-16 DIAGNOSIS — N3946 Mixed incontinence: Secondary | ICD-10-CM | POA: Diagnosis not present

## 2023-06-16 DIAGNOSIS — M6281 Muscle weakness (generalized): Secondary | ICD-10-CM | POA: Diagnosis not present

## 2023-06-16 DIAGNOSIS — R41841 Cognitive communication deficit: Secondary | ICD-10-CM | POA: Diagnosis not present

## 2023-06-17 DIAGNOSIS — M6281 Muscle weakness (generalized): Secondary | ICD-10-CM | POA: Diagnosis not present

## 2023-06-17 DIAGNOSIS — N3946 Mixed incontinence: Secondary | ICD-10-CM | POA: Diagnosis not present

## 2023-06-17 DIAGNOSIS — R41841 Cognitive communication deficit: Secondary | ICD-10-CM | POA: Diagnosis not present

## 2023-06-21 DIAGNOSIS — N3946 Mixed incontinence: Secondary | ICD-10-CM | POA: Diagnosis not present

## 2023-06-21 DIAGNOSIS — M6281 Muscle weakness (generalized): Secondary | ICD-10-CM | POA: Diagnosis not present

## 2023-06-21 DIAGNOSIS — R41841 Cognitive communication deficit: Secondary | ICD-10-CM | POA: Diagnosis not present

## 2023-06-22 DIAGNOSIS — R41841 Cognitive communication deficit: Secondary | ICD-10-CM | POA: Diagnosis not present

## 2023-06-22 DIAGNOSIS — M6281 Muscle weakness (generalized): Secondary | ICD-10-CM | POA: Diagnosis not present

## 2023-06-22 DIAGNOSIS — N3946 Mixed incontinence: Secondary | ICD-10-CM | POA: Diagnosis not present

## 2023-06-23 DIAGNOSIS — N3946 Mixed incontinence: Secondary | ICD-10-CM | POA: Diagnosis not present

## 2023-06-23 DIAGNOSIS — M6281 Muscle weakness (generalized): Secondary | ICD-10-CM | POA: Diagnosis not present

## 2023-06-23 DIAGNOSIS — R41841 Cognitive communication deficit: Secondary | ICD-10-CM | POA: Diagnosis not present

## 2023-06-27 DIAGNOSIS — M6281 Muscle weakness (generalized): Secondary | ICD-10-CM | POA: Diagnosis not present

## 2023-06-27 DIAGNOSIS — N3946 Mixed incontinence: Secondary | ICD-10-CM | POA: Diagnosis not present

## 2023-06-27 DIAGNOSIS — R41841 Cognitive communication deficit: Secondary | ICD-10-CM | POA: Diagnosis not present

## 2023-06-28 DIAGNOSIS — R41841 Cognitive communication deficit: Secondary | ICD-10-CM | POA: Diagnosis not present

## 2023-06-28 DIAGNOSIS — M6281 Muscle weakness (generalized): Secondary | ICD-10-CM | POA: Diagnosis not present

## 2023-06-28 DIAGNOSIS — N3946 Mixed incontinence: Secondary | ICD-10-CM | POA: Diagnosis not present

## 2023-06-29 DIAGNOSIS — M6281 Muscle weakness (generalized): Secondary | ICD-10-CM | POA: Diagnosis not present

## 2023-06-29 DIAGNOSIS — R41841 Cognitive communication deficit: Secondary | ICD-10-CM | POA: Diagnosis not present

## 2023-06-29 DIAGNOSIS — N3946 Mixed incontinence: Secondary | ICD-10-CM | POA: Diagnosis not present

## 2023-06-30 DIAGNOSIS — N3946 Mixed incontinence: Secondary | ICD-10-CM | POA: Diagnosis not present

## 2023-06-30 DIAGNOSIS — M6281 Muscle weakness (generalized): Secondary | ICD-10-CM | POA: Diagnosis not present

## 2023-06-30 DIAGNOSIS — R41841 Cognitive communication deficit: Secondary | ICD-10-CM | POA: Diagnosis not present

## 2023-07-04 DIAGNOSIS — R41841 Cognitive communication deficit: Secondary | ICD-10-CM | POA: Diagnosis not present

## 2023-07-04 DIAGNOSIS — M6281 Muscle weakness (generalized): Secondary | ICD-10-CM | POA: Diagnosis not present

## 2023-07-04 DIAGNOSIS — N3946 Mixed incontinence: Secondary | ICD-10-CM | POA: Diagnosis not present

## 2023-07-05 DIAGNOSIS — M6281 Muscle weakness (generalized): Secondary | ICD-10-CM | POA: Diagnosis not present

## 2023-07-05 DIAGNOSIS — R41841 Cognitive communication deficit: Secondary | ICD-10-CM | POA: Diagnosis not present

## 2023-07-05 DIAGNOSIS — N3946 Mixed incontinence: Secondary | ICD-10-CM | POA: Diagnosis not present

## 2023-07-06 DIAGNOSIS — R41841 Cognitive communication deficit: Secondary | ICD-10-CM | POA: Diagnosis not present

## 2023-07-06 DIAGNOSIS — M6281 Muscle weakness (generalized): Secondary | ICD-10-CM | POA: Diagnosis not present

## 2023-07-06 DIAGNOSIS — N3946 Mixed incontinence: Secondary | ICD-10-CM | POA: Diagnosis not present

## 2023-07-07 ENCOUNTER — Non-Acute Institutional Stay: Payer: Medicare Other | Admitting: Nurse Practitioner

## 2023-07-07 ENCOUNTER — Encounter: Payer: Self-pay | Admitting: Nurse Practitioner

## 2023-07-07 DIAGNOSIS — Z0000 Encounter for general adult medical examination without abnormal findings: Secondary | ICD-10-CM

## 2023-07-07 DIAGNOSIS — M6281 Muscle weakness (generalized): Secondary | ICD-10-CM | POA: Diagnosis not present

## 2023-07-07 DIAGNOSIS — R41841 Cognitive communication deficit: Secondary | ICD-10-CM | POA: Diagnosis not present

## 2023-07-07 DIAGNOSIS — N3946 Mixed incontinence: Secondary | ICD-10-CM | POA: Diagnosis not present

## 2023-07-07 NOTE — Progress Notes (Signed)
Subjective:   Deanna Shields is a 89 y.o. female who presents for Medicare Annual (Subsequent) preventive examination.  Visit Complete: In person  Patient Medicare AWV questionnaire was completed by the patient on 07/07/23; I have confirmed that all information answered by patient is correct and no changes since this date.  Cardiac Risk Factors include: advanced age (>40men, >49 women);dyslipidemia;hypertension;obesity (BMI >30kg/m2)     Objective:    Today's Vitals   07/07/23 1402  BP: 132/74  Pulse: 78  Resp: 18  Temp: (!) 97.5 F (36.4 C)  SpO2: 96%  Weight: 191 lb 3.2 oz (86.7 kg)  Height: 5' (1.524 m)  PainSc: 0-No pain   Body mass index is 37.34 kg/m.     07/07/2023    3:08 PM 07/20/2022    2:31 PM 07/06/2022    1:44 PM 07/02/2022    9:41 AM 04/12/2022   11:31 AM 01/19/2022   11:57 AM 12/17/2021    9:43 AM  Advanced Directives  Does Patient Have a Medical Advance Directive? Yes Yes Yes Yes Yes Yes Yes  Type of [name];Living will;Out of facility DNR (pink MOST or yellow form) [name];Out of facility DNR (pink MOST or yellow form) [name];Out of facility DNR (pink MOST or yellow form) [name];Out of facility DNR (pink MOST or yellow form) [name];Living will [name];Living will;Out of facility DNR (pink MOST or yellow form) [name];Living will;Out of facility DNR (pink MOST or yellow form)  Does patient want to make changes to medical advance directive? No - Patient declined No - Patient declined No - Patient declined No - Patient declined No - Patient declined No - Patient declined No - Patient declined  Copy of Healthcare Power of Attorney in Chart? Yes - validated most recent copy scanned in chart (See row information) Yes - validated most recent copy scanned in chart (See row information) Yes - validated  most recent copy scanned in chart (See row information) Yes - validated most recent copy scanned in chart (See row information) Yes - validated most recent copy scanned in chart (See row information) Yes - validated most recent copy scanned in chart (See row information) Yes - validated most recent copy scanned in chart (See row information)    Current Medications (verified) Outpatient Encounter Medications as of 07/07/2023  Medication Sig   acetaminophen (TYLENOL) 325 MG tablet Take 650 mg by mouth every 6 (six) hours as needed for mild pain.   alum & mag hydroxide-simeth (MAALOX PLUS) 400-400-40 MG/5ML suspension Take 10 mLs by mouth every 6 (six) hours as needed for indigestion or heartburn.   amLODipine (NORVASC) 5 MG tablet Take 1 tablet (5 mg total) by mouth daily.   benzonatate (TESSALON) 100 MG capsule Take 1 capsule by mouth 3 (three) times daily.   calcium carbonate (OSCAL) 1500 (600 Ca) MG TABS tablet Take 600 mg of elemental calcium by mouth daily.   dextromethorphan-guaiFENesin (ROBITUSSIN-DM) 10-100 MG/5ML liquid Take by mouth every 6 (six) hours as needed for cough.   Ergocalciferol (VITAMIN D2) 50 MCG (2000 UT) TABS Take by mouth.   lidocaine (ASPERCREME LIDOCAINE) 4 % Place 1 patch onto the skin daily.   lidocaine 4 % Place 1 patch onto the skin every 12 (twelve) hours as needed (back pain). Remove & Discard patch within 12 hours or as directed by MD   memantine (NAMENDA) 10 MG  tablet Take 10 mg by mouth 2 (two) times daily.   Nutritional Supplements (RESOURCE 2.0) LIQD Take by mouth. 180 ml; oral Twice A Day Special Instructions: administer with med pass   omeprazole (PRILOSEC) 40 MG capsule Take 40 mg by mouth daily.   ondansetron (ZOFRAN-ODT) 4 MG disintegrating tablet Take 4 mg by mouth every 6 (six) hours as needed for nausea or vomiting.   vitamin B-12 (CYANOCOBALAMIN) 1000 MCG tablet Take 1,000 mcg by mouth daily.   No facility-administered encounter medications on file  as of 07/07/2023.    Allergies (verified) Contrast media [iodinated contrast media] and Codeine   History: Past Medical History:  Diagnosis Date   Arthritis    "right thumb" (01/29/2015)   Chronic shoulder pain    "between my shoulders" (01/29/2015)   Dizziness    Esophageal stricture 2009   Gallstones    GERD (gastroesophageal reflux disease) 2004   Hemorrhoids 2004   Hiatal hernia 2004   History of blood transfusion 1947   "appendix ruptured"   HOH (hard of hearing)    Hypertension    Rhinitis, allergic    Syncopal episodes    Past Surgical History:  Procedure Laterality Date   APPENDECTOMY  1947   "ruptured"   CARDIAC CATHETERIZATION  10/23/1999; 09/2014   EF 60% No significant CAD; patent coronary arteries.   CARPAL TUNNEL RELEASE Right 1990's   CHOLECYSTECTOMY N/A 01/29/2015   Procedure: LAPAROSCOPIC CHOLECYSTECTOMY;  Surgeon: Emelia Loron, MD;  Location: [facility] OR;  Service: General;  Laterality: N/A;   DILATION AND CURETTAGE OF UTERUS  "couple"   ERCP N/A 09/13/2021   Procedure: ENDOSCOPIC RETROGRADE CHOLANGIOPANCREATOGRAPHY (ERCP);  Surgeon: Jeani Hawking, MD;  Location: [facility];  Service: Gastroenterology;  Laterality: N/A;   ESOPHAGOGASTRODUODENOSCOPY (EGD) WITH ESOPHAGEAL DILATION  2-3 times   HEMORROIDECTOMY     KNEE ARTHROSCOPY Bilateral    right x 2   LAPAROSCOPIC CHOLECYSTECTOMY  01/29/2015   LEFT HEART CATHETERIZATION WITH CORONARY ANGIOGRAM N/A 09/16/2014   Procedure: LEFT HEART CATHETERIZATION WITH CORONARY ANGIOGRAM;  Surgeon: Runell Gess, MD;  Location: [facility] CATH LAB;  Service: Cardiovascular;  Laterality: N/A;   NM MYOVIEW LTD  06/01/2012   EF 71%   REMOVAL OF STONES  09/13/2021   Procedure: REMOVAL OF STONES;  Surgeon: Jeani Hawking, MD;  Location: [facility];  Service: Gastroenterology;;   Dennison Mascot  09/13/2021   Procedure: Dennison Mascot;  Surgeon: Jeani Hawking, MD;  Location: [facility] END[facility];  Service: Gastroenterology;;   VAGINAL  HYSTERECTOMY  [phone]   Family History  Problem Relation Age of Onset   Stroke Father 48       light stroke   Heart disease Sister    COPD Sister    Heart disease Brother    Coronary artery disease Neg Hx    Diabetes Neg Hx    Cancer Neg Hx        breast, colon, prostate   Social History   Socioeconomic History   Marital status: Widowed    Spouse name: Not on file   Number of children: 0   Years of education: Not on file   Highest education level: Not on file  Occupational History   Occupation: retired    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   She does drink/eat things with caffeine in it.   Marital status: widow (married in [place])   Patient lives in an assisted living 1 story apartment home   Highest level of education completed was high school.   Past profession: home maker   Exercise: walk 3 x week   Patient has a POA and living   Social Drivers of Corporate investment banker Strain: Not on file  Food Insecurity: Not on file  Transportation Needs: Not on file  Physical Activity: Not on file  Stress: Not on file  Social Connections: Not on file    Tobacco Counseling Counseling given: Not Answered   Clinical Intake:  Pre-visit preparation completed: Yes  Pain : No/denies pain Pain Score: 0-No pain     BMI - recorded: 37.35 Nutritional Status: BMI > 30  Obese Nutritional Risks: None Diabetes: No  How often do you need to have someone help you when you read instructions, pamphlets, or other written materials from your doctor or pharmacy?: 4 - Often What is the last grade  level you completed in school?: college  Interpreter Needed?: No  Information entered by :: Alba Perillo Nedra Hai NP   Activities of Daily Living    07/07/2023    4:00 PM  In your present state of health, do you have any difficulty performing the following activities:  Hearing? 1  Comment hearing aids R+L  Vision? 0  Difficulty concentrating or making decisions? 1  Walking or climbing stairs? 1  Dressing or bathing? 1  Doing errands, shopping? 1  Preparing Food and eating ? N  Using the Toilet? N  In the past six months, have you accidently leaked urine? Y  Do you have problems with loss of bowel control? N  Managing your Medications? Y  Managing your Finances? Y  Housekeeping or managing your Housekeeping? Y    Patient Care Team: Venita Sheffield, MD as PCP - General (Internal Medicine) Runell Gess, MD as Consulting Physician (Cardiology)  Indicate any recent Medical Services you may have received from other than [facility] providers in the past year (date may be approximate).     Assessment:   This is a routine wellness examination for Deanna.  Hearing/Vision screen No results found.   Goals Addressed             This Visit's Progress    Maintain Mobility and Function       Evidence-based guidance:  Acknowledge and validate impact of pain, loss of strength and potential disfigurement (hand osteoarthritis) on mental health and daily life, such as social isolation, anxiety, depression, impaired sexual relationship and   injury from falls.  Anticipate referral to physical or occupational therapy for assessment, therapeutic exercise and recommendation for adaptive equipment or assistive devices; encourage participation.  Assess impact on ability to perform activities of daily living, as well as engage in sports and leisure events or requirements of work or school.  Provide anticipatory guidance and reassurance about the benefit of exercise to maintain function;  acknowledge and normalize fear that exercise may worsen symptoms.  Encourage regular exercise, at least 10 minutes at a time for 45 minutes per week; consider yoga, water exercise and proprioceptive exercises; encourage use of wearable activity tracker to increase motivation and  adherence.  Encourage maintenance or resumption of daily activities, including employment, as pain allows and with minimal exposure to trauma.  Assist patient to advocate for adaptations to the work environment.  Consider level of pain and function, gender, age, lifestyle, patient preference, quality of life, readiness and ?ocapacity to benefit? when recommending patients for orthopaedic surgery consultation.  Explore strategies, such as changes to medication regimen or activity that enables patient to anticipate and manage flare-ups that increase deconditioning and disability.  Explore patient preferences; encourage exposure to a broader range of activities that have been avoided for fear of experiencing pain.  Identify barriers to participation in therapy or exercise, such as pain with activity, anticipated or imagined pain.  Monitor postoperative joint replacement or any preexisting joint replacement for ongoing pain and loss of function; provide social support and encouragement throughout recovery.   Notes:       Depression Screen    07/07/2023    4:04 PM 07/24/2018    1:17 PM 05/10/2016    1:20 PM 08/25/2015   11:26 AM 08/25/2015   10:48 AM 08/19/2014   10:49 AM 07/18/2013   11:01 AM  PHQ 2/9 Scores  PHQ - 2 Score 0 0 0 0 1 0 0  Exception Documentation    Patient refusal       Fall Risk    07/06/2022    1:44 PM 07/02/2022    9:42 AM 04/27/2019   10:06 AM 04/21/2018    1:12 PM 02/15/2017   10:04 AM  Fall Risk   Falls in the past year? 0 0 0 0 No  Comment   [facility] Telephone Survey: data to providers prior to load [employer] Survey: data to providers prior to load   Number falls in past yr: 0 0     Injury  with Fall? 0 0     Risk for fall due to : No Fall Risks No Fall Risks     Follow up Falls evaluation completed Falls evaluation completed       MEDICARE RISK AT HOME: Medicare Risk at Home Any stairs in or around the home?: Yes If so, are there any without handrails?: No Home free of loose throw rugs in walkways, pet beds, electrical cords, etc?: Yes Adequate lighting in your home to reduce risk of falls?: Yes Life alert?: No Use of a cane, walker or w/c?: Yes Grab bars in the bathroom?: Yes Shower chair or bench in shower?: Yes Elevated toilet seat or a handicapped toilet?: Yes  TIMED UP AND GO:  Was the test performed?  Yes  Length of time to ambulate 10 feet: 15 sec Gait slow and steady with assistive device    Cognitive Function:    07/06/2022    1:26 PM  MMSE - Mini Mental State Exam  Orientation to time 3  Orientation to Place 4  Registration 3  Attention/ Calculation 1  Recall 3  Language- name 2 objects 2  Language- repeat 1  Language- follow 3 step command 3  Language- read & follow direction 1  Write a sentence 1  Copy design 0  Total score 22        Immunizations Immunization History  Administered Date(s) Administered   Fluad Quad(high Dose 65+) 03/29/2022   Influenza Split 03/16/2011, 03/01/2012   Influenza Whole 03/28/2007, 04/01/2009, 02/18/2010   Influenza, High Dose Seasonal PF 03/14/2013   Influenza,inj,Quad PF,6+ Mos 03/20/2014, 03/11/2015   Influenza-Unspecified 08/06/2018, 03/20/2020   Moderna SARS-COV2 Booster Vaccination 11/04/2020  Moderna Sars-Covid-2 Vaccination 06/11/2019, 07/09/2019, 02/24/2021   PPD Test 04/23/2020, 11/03/2021   Pfizer Covid-19 Vaccine Bivalent Booster 59yrs & up 04/08/2022   Pneumococcal Conjugate-13 02/25/2015   Pneumococcal Polysaccharide-23 03/28/2007   Td 12/20/2005    TDAP status: Due, Education has been provided regarding the importance of this vaccine. Advised may receive this vaccine at local pharmacy  or Health Dept. Aware to provide a copy of the vaccination record if obtained from local pharmacy or Health Dept. Verbalized acceptance and understanding.  Flu Vaccine status: Due, Education has been provided regarding the importance of this vaccine. Advised may receive this vaccine at local pharmacy or Health Dept. Aware to provide a copy of the vaccination record if obtained from local pharmacy or Health Dept. Verbalized acceptance and understanding.  Pneumococcal vaccine status: Up to date  Covid-19 vaccine status: Information provided on how to obtain vaccines.   Qualifies for Shingles Vaccine? Yes   Zostavax completed Yes   Shingrix Completed?: No.    Education has been provided regarding the importance of this vaccine. Patient has been advised to call insurance company to determine out of pocket expense if they have not yet received this vaccine. Advised may also receive vaccine at local pharmacy or Health Dept. Verbalized acceptance and understanding.  Screening Tests Health Maintenance  Topic Date Due   Zoster Vaccines- Shingrix (1 of 2) Never done   DTaP/Tdap/Td (2 - Tdap) 12/21/2015   INFLUENZA VACCINE  01/06/2023   COVID-19 Vaccine (6 - 2024-25 season) 02/06/2023   Medicare Annual Wellness (AWV)  07/06/2024   Pneumonia Vaccine 11+ Years old  Completed   DEXA SCAN  Completed   HPV VACCINES  Aged Out   MAMMOGRAM  Discontinued    Health Maintenance  Health Maintenance Due  Topic Date Due   Zoster Vaccines- Shingrix (1 of 2) Never done   DTaP/Tdap/Td (2 - Tdap) 12/21/2015   INFLUENZA VACCINE  01/06/2023   COVID-19 Vaccine (6 - 2024-25 season) 02/06/2023    Colorectal cancer screening: No longer required.   Mammogram status: No longer required due to aged out.  Bone Density status: Completed 07/21/23. Results reflect: Bone density results: OSTEOPENIA. Repeat every 2 years.  Lung Cancer Screening: (Low Dose CT Chest recommended if Age 88-80 years, 20 pack-year currently  smoking OR have quit w/in 15years.) does not qualify.   Lung Cancer Screening Referral: NA  Additional Screening:  Hepatitis C Screening: does not qualify;   Vision Screening: Recommended annual ophthalmology exams for early detection of glaucoma and other disorders of the eye. Is the patient up to date with their annual eye exam?  No  Who is the provider or what is the name of the office in which the patient attends annual eye exams? HPOA will provide if needed If pt is not established with a provider, would they like to be referred to a provider to establish care? No .   Dental Screening: Recommended annual dental exams for proper oral hygiene  Diabetic Foot Exam: NA  Community Resource Referral / Chronic Care Management: CRR required this visit?  No   CCM required this visit?  No     Plan:     I have personally reviewed and noted the following in the patient's chart:   Medical and social history Use of alcohol, tobacco or illicit drugs  Current medications and supplements including opioid prescriptions. Patient is not currently taking opioid prescriptions. Functional ability and status Nutritional status Physical activity Advanced directives List of other physicians  Hospitalizations, surgeries, and ER visits in previous 12 months Vitals Screenings to include cognitive, depression, and falls Referrals and appointments  In addition, I have reviewed and discussed with patient certain preventive protocols, quality metrics, and best practice recommendations. A written personalized care plan for preventive services as well as general preventive health recommendations were provided to patient.     Tyquarius Paglia X Genice Kimberlin, NP   07/12/2023   After Visit Summary: (In Person-Declined) Patient declined AVS at this time.

## 2023-07-12 ENCOUNTER — Encounter: Payer: Self-pay | Admitting: Nurse Practitioner

## 2023-07-12 ENCOUNTER — Non-Acute Institutional Stay: Payer: Self-pay | Admitting: Nurse Practitioner

## 2023-07-12 DIAGNOSIS — N3946 Mixed incontinence: Secondary | ICD-10-CM | POA: Diagnosis not present

## 2023-07-12 DIAGNOSIS — K219 Gastro-esophageal reflux disease without esophagitis: Secondary | ICD-10-CM | POA: Diagnosis not present

## 2023-07-12 DIAGNOSIS — M545 Low back pain, unspecified: Secondary | ICD-10-CM | POA: Diagnosis not present

## 2023-07-12 DIAGNOSIS — I10 Essential (primary) hypertension: Secondary | ICD-10-CM | POA: Diagnosis not present

## 2023-07-12 DIAGNOSIS — H109 Unspecified conjunctivitis: Secondary | ICD-10-CM | POA: Insufficient documentation

## 2023-07-12 DIAGNOSIS — I872 Venous insufficiency (chronic) (peripheral): Secondary | ICD-10-CM | POA: Diagnosis not present

## 2023-07-12 DIAGNOSIS — R053 Chronic cough: Secondary | ICD-10-CM | POA: Diagnosis not present

## 2023-07-12 DIAGNOSIS — F0390 Unspecified dementia without behavioral disturbance: Secondary | ICD-10-CM | POA: Diagnosis not present

## 2023-07-12 DIAGNOSIS — N1831 Chronic kidney disease, stage 3a: Secondary | ICD-10-CM

## 2023-07-12 DIAGNOSIS — H1013 Acute atopic conjunctivitis, bilateral: Secondary | ICD-10-CM

## 2023-07-12 DIAGNOSIS — M6281 Muscle weakness (generalized): Secondary | ICD-10-CM | POA: Diagnosis not present

## 2023-07-12 DIAGNOSIS — R41841 Cognitive communication deficit: Secondary | ICD-10-CM | POA: Diagnosis not present

## 2023-07-12 NOTE — Assessment & Plan Note (Signed)
Blood pressure is controlled,  takes Amlodipine

## 2023-07-12 NOTE — Assessment & Plan Note (Signed)
tearing, itching eyes for a day or two, slightly injected, denied vision changes or pain in eyes.

## 2023-07-12 NOTE — Assessment & Plan Note (Signed)
off Furosemide, not new,  EF 50-55% 06/01/2017.

## 2023-07-12 NOTE — Assessment & Plan Note (Signed)
tearing, itching eyes for a day or two, slightly injected, denied vision changes or pain in eyes.  Naphcon A Igtt bid OU x7 days

## 2023-07-12 NOTE — Progress Notes (Signed)
 Location:   [facility] Room Number: 804 Place of Service:  ALF (13) Provider: Larwance Edithe Dobbin NP  Sherlynn Madden, MD  Patient Care Team: Sherlynn Madden, MD as PCP - General (Internal Medicine) Court Dorn PARAS, MD as Consulting Physician (Cardiology)  Extended Emergency Contact Information Primary Emergency Contact: Mathis Mems Address: [address]          [place], [place] [address] [place] Home Phone: [phone] Mobile Phone: [phone] Relation: Niece  Code Status: DNR Goals of care: Advanced Directive information    07/07/2023    3:08 PM  Advanced Directives  Does Patient Have a Medical Advance Directive? Yes  Type of Estate Agent of [place];Living will;Out of facility DNR (pink MOST or yellow form)  Does patient want to make changes to medical advance directive? No - Patient declined  Copy of Healthcare Power of Attorney in Chart? Yes - validated most recent copy scanned in chart (See row information)     Chief Complaint  Patient presents with   Acute Visit    Itching eyes, tearing    HPI:  Pt is a 89 y.o. female seen today for an acute visit for tearing, itching eyes for a day or two, slightly injected, denied vision changes or pain in eyes.   Senile dementia, MMSE 19/30 02/22/23, tolerated Namenda well, the patient known for waling around the facility and picking up objects. TSH 3.4 04/05/23             Lower back pain, mild, ambulates with walker, X-ray 02/11/23 thoracic, lumbar spine no compression deformities, mild chronic compression fx lf L1(10%)               Weigh stable, off  Mirtazapine 07/02/22 09/12/21 choledocholithiasis/ascending cholangitis s/p ERCP and stone removal. Severe sepsis/E coli bacteremia- fully treated.              Chronic cough, on Benzonatate              CKD, stage 3, stable, Bun/creat 30/1.5 05/24/23             GERD, better indigestion, burping, on Omeprazole . prn  Maalox             Edema BLE, off Furosemide, not new,  EF 50-55% 06/01/2017.             HTN, takes Amlodipine              Hyperlipidemia, LDL 77 06/18/21, off Rosuvastatin              Anemia,  Vit B12 228 04/03/20, added Vit B 216/22, off Fe, Hgb 12.9 05/24/23             Osteopenia,  DEXA 07/15/22 t score -2.275, on Ca, Vit D         Past Medical History:  Diagnosis Date   Arthritis    right thumb (01/29/2015)   Chronic shoulder pain    between my shoulders (01/29/2015)   Dizziness    Esophageal stricture 2009   Gallstones    GERD (gastroesophageal reflux disease) 2004   Hemorrhoids 2004   Hiatal hernia 2004   History of blood transfusion 1947   appendix ruptured   HOH (hard of hearing)    Hypertension    Rhinitis, allergic    Syncopal episodes    Past Surgical History:  Procedure Laterality Date   APPENDECTOMY  1947   ruptured   CARDIAC CATHETERIZATION  10/23/1999; 09/2014  EF 60% No significant CAD; patent coronary arteries.   CARPAL TUNNEL RELEASE Right 1990's   CHOLECYSTECTOMY N/A 01/29/2015   Procedure: LAPAROSCOPIC CHOLECYSTECTOMY;  Surgeon: Donnice Bury, MD;  Location: [facility] OR;  Service: General;  Laterality: N/A;   DILATION AND CURETTAGE OF UTERUS  couple   ERCP N/A 09/13/2021   Procedure: ENDOSCOPIC RETROGRADE CHOLANGIOPANCREATOGRAPHY (ERCP);  Surgeon: Rollin Dover, MD;  Location: [facility];  Service: Gastroenterology;  Laterality: N/A;   ESOPHAGOGASTRODUODENOSCOPY (EGD) WITH ESOPHAGEAL DILATION  2-3 times   HEMORROIDECTOMY     KNEE ARTHROSCOPY Bilateral    right x 2   LAPAROSCOPIC CHOLECYSTECTOMY  01/29/2015   LEFT HEART CATHETERIZATION WITH CORONARY ANGIOGRAM N/A 09/16/2014   Procedure: LEFT HEART CATHETERIZATION WITH CORONARY ANGIOGRAM;  Surgeon: Dorn JINNY Lesches, MD;  Location: [facility] CATH LAB;  Service: Cardiovascular;  Laterality: N/A;   NM MYOVIEW  LTD  06/01/2012   EF 71%   REMOVAL OF STONES  09/13/2021   Procedure: REMOVAL OF STONES;  Surgeon:  Rollin Dover, MD;  Location: [facility];  Service: Gastroenterology;;   ANNETT  09/13/2021   Procedure: ANNETT;  Surgeon: Rollin Dover, MD;  Location: [facility];  Service: Gastroenterology;;   VAGINAL HYSTERECTOMY  1970's    Allergies  Allergen Reactions   Contrast Media [Iodinated Contrast Media] Itching and Rash    Pt covered in rash, red.   Codeine Nausea And Vomiting    in large doses: can tolerate Tussionex    Allergies as of 07/12/2023       Reactions   Contrast Media [iodinated Contrast Media] Itching, Rash   Pt covered in rash, red.   Codeine Nausea And Vomiting   in large doses: can tolerate Tussionex        Medication List        Accurate as of July 12, 2023 11:59 PM. If you have any questions, ask your nurse or doctor.          acetaminophen  325 MG tablet Commonly known as: TYLENOL  Take 650 mg by mouth every 6 (six) hours as needed for mild pain.   alum & mag hydroxide-simeth 400-400-40 MG/5ML suspension Commonly known as: MAALOX PLUS Take 10 mLs by mouth every 6 (six) hours as needed for indigestion or heartburn.   amLODipine  5 MG tablet Commonly known as: NORVASC  Take 1 tablet (5 mg total) by mouth daily.   benzonatate 100 MG capsule Commonly known as: TESSALON Take 1 capsule by mouth 3 (three) times daily.   calcium  carbonate 1500 (600 Ca) MG Tabs tablet Commonly known as: OSCAL Take 600 mg of elemental calcium  by mouth daily.   cyanocobalamin  1000 MCG tablet Commonly known as: VITAMIN B12 Take 1,000 mcg by mouth daily.   dextromethorphan -guaiFENesin  10-100 MG/5ML liquid Commonly known as: ROBITUSSIN-DM Take by mouth every 6 (six) hours as needed for cough.   lidocaine  4 % Place 1 patch onto the skin every 12 (twelve) hours as needed (back pain). Remove & Discard patch within 12 hours or as directed by MD   Aspercreme Lidocaine  4 % Generic drug: lidocaine  Place 1 patch onto the skin daily.   memantine 10 MG  tablet Commonly known as: NAMENDA Take 10 mg by mouth 2 (two) times daily.   omeprazole  40 MG capsule Commonly known as: PRILOSEC Take 40 mg by mouth daily.   ondansetron  4 MG disintegrating tablet Commonly known as: ZOFRAN -ODT Take 4 mg by mouth every 6 (six) hours as needed for nausea or vomiting.   Resource 2.0 Liqd Take  by mouth. 180 ml; oral Twice A Day Special Instructions: administer with med pass   Vitamin D2 50 MCG (2000 UT) Tabs Take by mouth.        Review of Systems  Constitutional:  Negative for appetite change, fatigue and fever.  HENT:  Positive for hearing loss. Negative for congestion, mouth sores, sore throat and trouble swallowing.   Eyes:  Positive for discharge, redness and itching. Negative for pain and visual disturbance.  Respiratory:  Positive for cough. Negative for chest tightness and shortness of breath.        Baseline cough.   Cardiovascular:  Positive for leg swelling.  Gastrointestinal:  Positive for diarrhea, nausea and vomiting. Negative for abdominal pain.  Genitourinary:  Positive for frequency. Negative for dysuria and urgency.       Baseline urinary frequency  Musculoskeletal:  Positive for back pain and gait problem.  Skin:  Negative for color change.  Neurological:  Negative for speech difficulty, weakness and light-headedness.       Memory lapses.   Psychiatric/Behavioral:  Negative for behavioral problems and sleep disturbance. The patient is not nervous/anxious.     Immunization History  Administered Date(s) Administered   Fluad Quad(high Dose 65+) 03/29/2022   Influenza Split 03/16/2011, 03/01/2012   Influenza Whole 03/28/2007, 04/01/2009, 02/18/2010   Influenza, High Dose Seasonal PF 03/14/2013   Influenza,inj,Quad PF,6+ Mos 03/20/2014, 03/11/2015   Influenza-Unspecified 08/06/2018, 03/20/2020   Moderna SARS-COV2 Booster Vaccination 11/04/2020   Moderna Sars-Covid-2 Vaccination 06/11/2019, 07/09/2019, 02/24/2021   PPD Test  04/23/2020, 11/03/2021   Pfizer Covid-19 Vaccine Bivalent Booster 85yrs & up 04/08/2022   Pneumococcal Conjugate-13 02/25/2015   Pneumococcal Polysaccharide-23 03/28/2007   Td 12/20/2005   Pertinent  Health Maintenance Due  Topic Date Due   INFLUENZA VACCINE  01/06/2023   DEXA SCAN  Completed   MAMMOGRAM  Discontinued      09/15/2021    3:00 PM 09/15/2021    8:00 PM 09/16/2021   10:06 AM 07/02/2022    9:42 AM 07/06/2022    1:44 PM  Fall Risk  Falls in the past year?    0 0  Was there an injury with Fall?    0 0  Fall Risk Category Calculator    0 0  (RETIRED) Patient Fall Risk Level High fall risk High fall risk High fall risk    Patient at Risk for Falls Due to    No Fall Risks No Fall Risks  Fall risk Follow up    Falls evaluation completed Falls evaluation completed   Functional Status Survey:    Vitals:   07/12/23 1645  BP: 130/78  Pulse: 74  Resp: 20  Temp: 98.2 F (36.8 C)  SpO2: 96%  Weight: 192 lb (87.1 kg)   Body mass index is 37.5 kg/m. Physical Exam Constitutional:      Appearance: Normal appearance.  HENT:     Head: Normocephalic and atraumatic.     Mouth/Throat:     Mouth: Mucous membranes are moist.     Pharynx: No oropharyngeal exudate or posterior oropharyngeal erythema.  Eyes:     General: No scleral icterus.       Right eye: Discharge present.        Left eye: Discharge present.    Extraocular Movements: Extraocular movements intact.     Pupils: Pupils are equal, round, and reactive to light.  Cardiovascular:     Rate and Rhythm: Normal rate and regular rhythm.     Heart  sounds: No murmur heard. Pulmonary:     Effort: Pulmonary effort is normal.     Breath sounds: No rales.  Abdominal:     General: Bowel sounds are normal.     Palpations: Abdomen is soft.     Tenderness: There is no abdominal tenderness. There is no right CVA tenderness, left CVA tenderness, guarding or rebound.  Musculoskeletal:        General: Tenderness present.      Cervical back: Normal range of motion and neck supple.     Right lower leg: Edema present.     Left lower leg: Edema present.     Comments: BLE chronic venous insufficiency skin changes. Trace edema BLE.  Lower back pain about T2/L1 region, no spinal spinous process tenderness noted.   Skin:    General: Skin is warm and dry.  Neurological:     General: No focal deficit present.     Mental Status: She is alert. Mental status is at baseline.     Gait: Gait abnormal.     Comments: Oriented to person, her room on unit. Habitual picking up objects when walking around facility.   Psychiatric:        Mood and Affect: Mood normal.        Behavior: Behavior normal.     Labs reviewed: Recent Labs    07/20/22 0655 05/10/23 0000 05/24/23 0000  NA 140 140 139  K 4.3 4.2 4.0  CL 105 104 106  CO2 28* 26* 27*  BUN 22* 21 30*  CREATININE 1.2* 1.3* 1.5*  CALCIUM  9.0 9.5 8.6*   Recent Labs    07/20/22 0655 05/10/23 0000 05/24/23 0000  AST 13 14 12*  ALT 9 8 8   ALKPHOS 73 96 82  ALBUMIN  3.4* 6.2* 3.4*   Recent Labs    07/20/22 0655 05/10/23 0000 05/24/23 0000  WBC 7.7 8.3 7.8  NEUTROABS 3,704.00 5,013.00 4,703.00  HGB 12.7 14.1 12.9  HCT 37 43 40  PLT 233 250 209   Lab Results  Component Value Date   TSH 1.313 09/13/2021   Lab Results  Component Value Date   HGBA1C 5.8 07/11/2017   Lab Results  Component Value Date   CHOL 136 06/18/2021   HDL 34 (A) 06/18/2021   LDLCALC 77 06/18/2021   LDLDIRECT 161.2 07/18/2013   TRIG 151 06/18/2021   CHOLHDL 2.9 04/03/2020    Significant Diagnostic Results in last 30 days:  No results found.  Assessment/Plan: Conjunctivitis tearing, itching eyes for a day or two, slightly injected, denied vision changes or pain in eyes.  Naphcon A Igtt bid OU x7 days  Senile dementia (HCC) tearing, itching eyes for a day or two, slightly injected, denied vision changes or pain in eyes.   Backache Lower back pain, mild, ambulates with  walker, X-ray 02/11/23 thoracic, lumbar spine no compression deformities, mild chronic compression fx lf L1(10%)  COUGH, CHRONIC on Benzonatate   CKD (chronic kidney disease) stage 3, GFR 30-59 ml/min (HCC)  stable, Bun/creat 30/1.5 05/24/23  GERD better indigestion, burping, on Omeprazole . prn Maalox  Edema of both lower extremities due to peripheral venous insufficiency off Furosemide, not new,  EF 50-55% 06/01/2017.  Hypertension Blood pressure is controlled, takes Amlodipine     Family/ staff Communication: plan of care reviewed with the patient and charge nurse.   Labs/tests ordered:  none  Time spend 40 minutes.

## 2023-07-12 NOTE — Assessment & Plan Note (Signed)
stable, Bun/creat 30/1.5 05/24/23

## 2023-07-12 NOTE — Assessment & Plan Note (Signed)
better indigestion, burping, on Omeprazole. prn Maalox 

## 2023-07-12 NOTE — Assessment & Plan Note (Signed)
Lower back pain, mild, ambulates with walker, X-ray 02/11/23 thoracic, lumbar spine no compression deformities, mild chronic compression fx lf L1(10%)

## 2023-07-12 NOTE — Assessment & Plan Note (Signed)
on Benzonatate  ?

## 2023-07-13 DIAGNOSIS — N3946 Mixed incontinence: Secondary | ICD-10-CM | POA: Diagnosis not present

## 2023-07-13 DIAGNOSIS — R41841 Cognitive communication deficit: Secondary | ICD-10-CM | POA: Diagnosis not present

## 2023-07-13 DIAGNOSIS — M6281 Muscle weakness (generalized): Secondary | ICD-10-CM | POA: Diagnosis not present

## 2023-07-15 DIAGNOSIS — R41841 Cognitive communication deficit: Secondary | ICD-10-CM | POA: Diagnosis not present

## 2023-07-15 DIAGNOSIS — N3946 Mixed incontinence: Secondary | ICD-10-CM | POA: Diagnosis not present

## 2023-07-15 DIAGNOSIS — M6281 Muscle weakness (generalized): Secondary | ICD-10-CM | POA: Diagnosis not present

## 2023-07-18 DIAGNOSIS — M6281 Muscle weakness (generalized): Secondary | ICD-10-CM | POA: Diagnosis not present

## 2023-07-18 DIAGNOSIS — R41841 Cognitive communication deficit: Secondary | ICD-10-CM | POA: Diagnosis not present

## 2023-07-18 DIAGNOSIS — N3946 Mixed incontinence: Secondary | ICD-10-CM | POA: Diagnosis not present

## 2023-07-20 DIAGNOSIS — R41841 Cognitive communication deficit: Secondary | ICD-10-CM | POA: Diagnosis not present

## 2023-07-20 DIAGNOSIS — N3946 Mixed incontinence: Secondary | ICD-10-CM | POA: Diagnosis not present

## 2023-07-20 DIAGNOSIS — M6281 Muscle weakness (generalized): Secondary | ICD-10-CM | POA: Diagnosis not present

## 2023-07-21 DIAGNOSIS — M6281 Muscle weakness (generalized): Secondary | ICD-10-CM | POA: Diagnosis not present

## 2023-07-21 DIAGNOSIS — N3946 Mixed incontinence: Secondary | ICD-10-CM | POA: Diagnosis not present

## 2023-07-21 DIAGNOSIS — R41841 Cognitive communication deficit: Secondary | ICD-10-CM | POA: Diagnosis not present

## 2023-07-26 DIAGNOSIS — N3946 Mixed incontinence: Secondary | ICD-10-CM | POA: Diagnosis not present

## 2023-07-26 DIAGNOSIS — M6281 Muscle weakness (generalized): Secondary | ICD-10-CM | POA: Diagnosis not present

## 2023-07-26 DIAGNOSIS — R41841 Cognitive communication deficit: Secondary | ICD-10-CM | POA: Diagnosis not present

## 2023-07-27 DIAGNOSIS — N3946 Mixed incontinence: Secondary | ICD-10-CM | POA: Diagnosis not present

## 2023-07-27 DIAGNOSIS — R41841 Cognitive communication deficit: Secondary | ICD-10-CM | POA: Diagnosis not present

## 2023-07-27 DIAGNOSIS — M6281 Muscle weakness (generalized): Secondary | ICD-10-CM | POA: Diagnosis not present

## 2023-07-28 ENCOUNTER — Non-Acute Institutional Stay: Payer: Self-pay | Admitting: Nurse Practitioner

## 2023-07-28 ENCOUNTER — Encounter: Payer: Self-pay | Admitting: Nurse Practitioner

## 2023-07-28 DIAGNOSIS — M545 Low back pain, unspecified: Secondary | ICD-10-CM | POA: Diagnosis not present

## 2023-07-28 DIAGNOSIS — D509 Iron deficiency anemia, unspecified: Secondary | ICD-10-CM

## 2023-07-28 DIAGNOSIS — K219 Gastro-esophageal reflux disease without esophagitis: Secondary | ICD-10-CM

## 2023-07-28 DIAGNOSIS — I872 Venous insufficiency (chronic) (peripheral): Secondary | ICD-10-CM | POA: Diagnosis not present

## 2023-07-28 DIAGNOSIS — E785 Hyperlipidemia, unspecified: Secondary | ICD-10-CM

## 2023-07-28 DIAGNOSIS — B372 Candidiasis of skin and nail: Secondary | ICD-10-CM | POA: Insufficient documentation

## 2023-07-28 DIAGNOSIS — R413 Other amnesia: Secondary | ICD-10-CM | POA: Diagnosis not present

## 2023-07-28 DIAGNOSIS — R41841 Cognitive communication deficit: Secondary | ICD-10-CM | POA: Diagnosis not present

## 2023-07-28 DIAGNOSIS — I10 Essential (primary) hypertension: Secondary | ICD-10-CM | POA: Diagnosis not present

## 2023-07-28 DIAGNOSIS — N1831 Chronic kidney disease, stage 3a: Secondary | ICD-10-CM | POA: Diagnosis not present

## 2023-07-28 DIAGNOSIS — M899 Disorder of bone, unspecified: Secondary | ICD-10-CM

## 2023-07-28 DIAGNOSIS — M949 Disorder of cartilage, unspecified: Secondary | ICD-10-CM

## 2023-07-28 DIAGNOSIS — N3946 Mixed incontinence: Secondary | ICD-10-CM | POA: Diagnosis not present

## 2023-07-28 DIAGNOSIS — M6281 Muscle weakness (generalized): Secondary | ICD-10-CM | POA: Diagnosis not present

## 2023-07-28 NOTE — Assessment & Plan Note (Signed)
LDL 77 06/18/21, off Rosuvastatin 

## 2023-07-28 NOTE — Progress Notes (Signed)
 Location:  [facility] Room Number: 804-A Place of Service:  ALF (13) Provider:  [facility]  Patient Care Team: Venita Sheffield, MD as PCP - General (Internal Medicine) Runell Gess, MD as Consulting Physician (Cardiology)  Extended Emergency Contact Information Primary Emergency Contact: Elita Quick Address: [address]          [place], [place] [address] [place] Home Phone: [phone] Mobile Phone: [phone] Relation: Niece  Code Status:  DNR Goals of care: Advanced Directive information    07/07/2023    3:08 PM  Advanced Directives  Does Patient Have a Medical Advance Directive? Yes  Type of Estate agent of [place];Living will;Out of facility DNR (pink MOST or yellow form)  Does patient want to make changes to medical advance directive? No - Patient declined  Copy of Healthcare Power of Attorney in Chart? Yes - validated most recent copy scanned in chart (See row information)     Chief Complaint  Patient presents with   Medical Management of Chronic Issues    HPI:  Pt is a 89 y.o. female seen today for managing chronic medication conditions.   Under breasts redness/rash, itching, recurrence.    Senile dementia, MMSE 19/30 02/22/23, tolerated Namenda well, the patient known for waling around the facility and picking up objects. TSH 3.4 04/05/23             Lower back pain, mild, ambulates with walker, X-ray 02/11/23 thoracic, lumbar spine no compression deformities, mild chronic compression fx lf L1(10%)               Weigh stable, off  Mirtazapine 07/02/22 09/12/21 choledocholithiasis/ascending cholangitis s/p ERCP and stone removal. Severe sepsis/E coli bacteremia- fully treated.              Chronic cough, on Benzonatate              CKD, stage 3, stable, Bun/creat 30/1.5 05/24/23             GERD, better indigestion, burping, on Omeprazole. prn Maalox             Edema BLE, off  Furosemide, not new,  EF 50-55% 06/01/2017.             HTN, takes Amlodipine             Hyperlipidemia, LDL 77 06/18/21, off Rosuvastatin             Anemia,  Vit B12 228 04/03/20, added Vit B 216/22, off Fe, Hgb 12.9 05/24/23             Osteopenia,  DEXA 07/15/22 t score -2.275, on Ca, Vit D. 03/07/23 offered Alendronate if desires-declined.      Past Medical History:  Diagnosis Date   Arthritis    "right thumb" (01/29/2015)   Chronic shoulder pain    "between my shoulders" (01/29/2015)   Dizziness    Esophageal stricture 2009   Gallstones    GERD (gastroesophageal reflux disease) 2004   Hemorrhoids 2004   Hiatal hernia 2004   History of blood transfusion 1947   "appendix ruptured"   HOH (hard of hearing)    Hypertension    Rhinitis, allergic    Syncopal episodes    Past Surgical History:  Procedure Laterality Date   APPENDECTOMY  1947   "ruptured"   CARDIAC CATHETERIZATION  10/23/1999; 09/2014   EF 60% No significant CAD; patent coronary arteries.   CARPAL  TUNNEL RELEASE Right 1990's   CHOLECYSTECTOMY N/A 01/29/2015   Procedure: LAPAROSCOPIC CHOLECYSTECTOMY;  Surgeon: Emelia Loron, MD;  Location: [facility] OR;  Service: General;  Laterality: N/A;   DILATION AND CURETTAGE OF UTERUS  "couple"   ERCP N/A 09/13/2021   Procedure: ENDOSCOPIC RETROGRADE CHOLANGIOPANCREATOGRAPHY (ERCP);  Surgeon: Jeani Hawking, MD;  Location: [facility];  Service: Gastroenterology;  Laterality: N/A;   ESOPHAGOGASTRODUODENOSCOPY (EGD) WITH ESOPHAGEAL DILATION  2-3 times   HEMORROIDECTOMY     KNEE ARTHROSCOPY Bilateral    right x 2   LAPAROSCOPIC CHOLECYSTECTOMY  01/29/2015   LEFT HEART CATHETERIZATION WITH CORONARY ANGIOGRAM N/A 09/16/2014   Procedure: LEFT HEART CATHETERIZATION WITH CORONARY ANGIOGRAM;  Surgeon: Runell Gess, MD;  Location: [facility] CATH LAB;  Service: Cardiovascular;  Laterality: N/A;   NM MYOVIEW LTD  06/01/2012   EF 71%   REMOVAL OF STONES  09/13/2021   Procedure: REMOVAL OF STONES;   Surgeon: Jeani Hawking, MD;  Location: [facility];  Service: Gastroenterology;;   Dennison Mascot  09/13/2021   Procedure: Dennison Mascot;  Surgeon: Jeani Hawking, MD;  Location: [facility] END[facility]COPY;  Service: Gastroenterology;;   VAGINAL HYSTERECTOMY  1970's    Allergies  Allergen Reactions   Contrast Media [Iodinated Contrast Media] Itching and Rash    Pt covered in rash, red.   Codeine Nausea And Vomiting    in large doses: can tolerate Tussionex    Outpatient Encounter Medications as of 07/28/2023  Medication Sig   acetaminophen (TYLENOL) 325 MG tablet Take 650 mg by mouth every 6 (six) hours as needed for mild pain.   alum & mag hydroxide-simeth (MAALOX PLUS) 400-400-40 MG/5ML suspension Take 10 mLs by mouth every 6 (six) hours as needed for indigestion or heartburn.   amLODipine (NORVASC) 5 MG tablet Take 1 tablet (5 mg total) by mouth daily.   benzonatate (TESSALON) 100 MG capsule Take 1 capsule by mouth 3 (three) times daily.   calcium carbonate (OSCAL) 1500 (600 Ca) MG TABS tablet Take 600 mg of elemental calcium by mouth daily.   dextromethorphan-guaiFENesin (ROBITUSSIN-DM) 10-100 MG/5ML liquid Take by mouth every 6 (six) hours as needed for cough.   Ergocalciferol (VITAMIN D2) 50 MCG (2000 UT) TABS Take by mouth.   fluconazole (DIFLUCAN) 100 MG tablet Take 100 mg by mouth daily. X 3 days   lidocaine 4 % Place 1 patch onto the skin every 12 (twelve) hours as needed (back pain). Remove & Discard patch within 12 hours or as directed by MD   memantine (NAMENDA) 10 MG tablet Take 10 mg by mouth 2 (two) times daily.   Nutritional Supplements (RESOURCE 2.0) LIQD Take by mouth. 180 ml; oral Twice A Day Special Instructions: administer with med pass   nystatin cream (MYCOSTATIN) Apply 1 Application topically 2 (two) times daily. Under breast for rash for 2 weeks   omeprazole (PRILOSEC) 40 MG capsule Take 40 mg by mouth daily.   ondansetron (ZOFRAN-ODT) 4 MG disintegrating tablet Take 4 mg by  mouth every 6 (six) hours as needed for nausea or vomiting.   triamcinolone cream (KENALOG) 0.1 % Apply 1 Application topically 2 (two) times daily. For rash x 2 weeks   vitamin B-12 (CYANOCOBALAMIN) 1000 MCG tablet Take 1,000 mcg by mouth daily.   [DISCONTINUED] lidocaine (ASPERCREME LIDOCAINE) 4 % Place 1 patch onto the skin daily.   No facility-administered encounter medications on file as of 07/28/2023.    Review of Systems  Constitutional:  Negative for appetite change, fatigue and fever.  HENT:  Positive for hearing loss. Negative for congestion and trouble swallowing.   Eyes:  Negative for visual disturbance.  Respiratory:  Positive for cough. Negative for chest tightness and shortness of breath.        Baseline cough.   Cardiovascular:  Positive for leg swelling.  Gastrointestinal:  Negative for abdominal pain.  Genitourinary:  Positive for frequency. Negative for dysuria and urgency.       Baseline urinary frequency  Musculoskeletal:  Positive for back pain and gait problem.  Skin:  Positive for rash. Negative for color change.  Neurological:  Negative for speech difficulty and weakness.       Memory lapses.   Psychiatric/Behavioral:  Negative for behavioral problems and sleep disturbance. The patient is not nervous/anxious.     Immunization History  Administered Date(s) Administered   Fluad Quad(high Dose 65+) 03/29/2022   Influenza Split 03/16/2011, 03/01/2012   Influenza Whole 03/28/2007, 04/01/2009, 02/18/2010   Influenza, High Dose Seasonal PF 03/14/2013   Influenza,inj,Quad PF,6+ Mos 03/20/2014, 03/11/2015   Influenza-Unspecified 08/06/2018, 03/20/2020   Moderna SARS-COV2 Booster Vaccination 11/04/2020   Moderna Sars-Covid-2 Vaccination 06/11/2019, 07/09/2019, 02/24/2021   PPD Test 04/23/2020, 11/03/2021   Pfizer Covid-19 Vaccine Bivalent Booster 45yrs & up 04/08/2022   Pneumococcal Conjugate-13 02/25/2015   Pneumococcal Polysaccharide-23 03/28/2007   Td  12/20/2005   Pertinent  Health Maintenance Due  Topic Date Due   INFLUENZA VACCINE  01/06/2023   DEXA SCAN  Completed   MAMMOGRAM  Discontinued      09/15/2021    3:00 PM 09/15/2021    8:00 PM 09/16/2021   10:06 AM 07/02/2022    9:42 AM 07/06/2022    1:44 PM  Fall Risk  Falls in the past year?    0 0  Was there an injury with Fall?    0 0  Fall Risk Category Calculator    0 0  (RETIRED) Patient Fall Risk Level High fall risk High fall risk High fall risk    Patient at Risk for Falls Due to    No Fall Risks No Fall Risks  Fall risk Follow up    Falls evaluation completed Falls evaluation completed   Functional Status Survey:    Vitals:   07/28/23 1045 07/28/23 1105  BP: (!) 148/88 (S) (!) 150/80  Pulse: 76   Temp: 98.7 F (37.1 C)   Weight: 192 lb (87.1 kg)   Height: 5' (1.524 m)    Body mass index is 37.5 kg/m. Physical Exam Constitutional:      Appearance: Normal appearance.  HENT:     Head: Normocephalic and atraumatic.     Mouth/Throat:     Mouth: Mucous membranes are moist.  Eyes:     Extraocular Movements: Extraocular movements intact.     Pupils: Pupils are equal, round, and reactive to light.  Cardiovascular:     Rate and Rhythm: Normal rate and regular rhythm.     Heart sounds: No murmur heard. Pulmonary:     Effort: Pulmonary effort is normal.     Breath sounds: No rales.  Abdominal:     General: Bowel sounds are normal.     Palpations: Abdomen is soft.     Tenderness: There is no abdominal tenderness.  Musculoskeletal:        General: Tenderness present.     Cervical back: Normal range of motion and neck supple.     Right lower leg: Edema present.     Left lower leg: Edema present.  Comments: BLE chronic venous insufficiency skin changes. Trace edema BLE.  Lower back pain about T2/L1 region, no spinal spinous process tenderness noted.   Skin:    General: Skin is warm and dry.     Findings: Erythema and rash present.     Comments:  Rash/redness, itching, under R+L breasts.   Neurological:     General: No focal deficit present.     Mental Status: She is alert. Mental status is at baseline.     Gait: Gait abnormal.     Comments: Oriented to person, her room on unit. Habitual picking up objects when walking around facility.   Psychiatric:        Mood and Affect: Mood normal.        Behavior: Behavior normal.     Labs reviewed: Recent Labs    05/10/23 0000 05/24/23 0000  NA 140 139  K 4.2 4.0  CL 104 106  CO2 26* 27*  BUN 21 30*  CREATININE 1.3* 1.5*  CALCIUM 9.5 8.6*   Recent Labs    05/10/23 0000 05/24/23 0000  AST 14 12*  ALT 8 8  ALKPHOS 96 82  ALBUMIN 6.2* 3.4*   Recent Labs    05/10/23 0000 05/24/23 0000  WBC 8.3 7.8  NEUTROABS 5,013.00 4,703.00  HGB 14.1 12.9  HCT 43 40  PLT 250 209   Lab Results  Component Value Date   TSH 1.313 09/13/2021   Lab Results  Component Value Date   HGBA1C 5.8 07/11/2017   Lab Results  Component Value Date   CHOL 136 06/18/2021   HDL 34 (A) 06/18/2021   LDLCALC 77 06/18/2021   LDLDIRECT 161.2 07/18/2013   TRIG 151 06/18/2021   CHOLHDL 2.9 04/03/2020    Significant Diagnostic Results in last 30 days:  No results found.  Assessment/Plan Skin candidiasis Under breasts redness/rash, recurrence. Will start: Diflucan 100mg  every day x 3 days, apply 0.1% Triamcinolone/Nystatin cream bid x 2 weeks, keep the reddened area dry and clean. Observe.   Memory loss MMSE 19/30 02/22/23, tolerated Namenda well, the patient known for waling around the facility and picking up objects. TSH 3.4 04/05/23  Backache mild, ambulates with walker, X-ray 02/11/23 thoracic, lumbar spine no compression deformities, mild chronic compression fx lf L1(10%)  CKD (chronic kidney disease) stage 3, GFR 30-59 ml/min (HCC) stable, Bun/creat 30/1.5 05/24/23  GERD better indigestion, burping, on Omeprazole. prn Maalox  Edema of both lower extremities due to peripheral venous  insufficiency off Furosemide, not new,  EF 50-55% 06/01/2017.  Hypertension Blood pressure is controlled, takes Amlodipine  Hyperlipidemia LDL 77 06/18/21, off Rosuvastatin  Anemia, iron deficiency Vit B12 228 04/03/20, added Vit B 216/22, off Fe, Hgb 12.9 05/24/23  OSTEOPENIA  DEXA 07/15/22 t score -2.275, on Ca, Vit D 03/07/23 offered Alendronate if desires-declined.      Family/ staff Communication: plan of care reviewed with the patient and charge nurse.   Labs/tests ordered:  none

## 2023-07-28 NOTE — Assessment & Plan Note (Signed)
 Under breasts redness/rash, recurrence. Will start: Diflucan 100mg  every day x 3 days, apply 0.1% Triamcinolone/Nystatin cream bid x 2 weeks, keep the reddened area dry and clean. Observe.

## 2023-07-28 NOTE — Assessment & Plan Note (Signed)
better indigestion, burping, on Omeprazole. prn Maalox 

## 2023-07-28 NOTE — Assessment & Plan Note (Signed)
Blood pressure is controlled,  takes Amlodipine

## 2023-07-28 NOTE — Assessment & Plan Note (Signed)
 Vit B12 228 04/03/20, added Vit B 216/22, off Fe, Hgb 12.9 05/24/23

## 2023-07-28 NOTE — Assessment & Plan Note (Signed)
off Furosemide, not new,  EF 50-55% 06/01/2017.

## 2023-07-28 NOTE — Assessment & Plan Note (Signed)
 stable, Bun/creat 30/1.5 05/24/23

## 2023-07-28 NOTE — Assessment & Plan Note (Signed)
 MMSE 19/30 02/22/23, tolerated Namenda well, the patient known for waling around the facility and picking up objects. TSH 3.4 04/05/23

## 2023-07-28 NOTE — Assessment & Plan Note (Signed)
 DEXA 07/15/22 t score -2.275, on Ca, Vit D 03/07/23 offered Alendronate if desires-declined.

## 2023-07-28 NOTE — Assessment & Plan Note (Signed)
mild, ambulates with walker, X-ray 02/11/23 thoracic, lumbar spine no compression deformities, mild chronic compression fx lf L1(10%)

## 2023-08-01 DIAGNOSIS — R41841 Cognitive communication deficit: Secondary | ICD-10-CM | POA: Diagnosis not present

## 2023-08-01 DIAGNOSIS — M6281 Muscle weakness (generalized): Secondary | ICD-10-CM | POA: Diagnosis not present

## 2023-08-01 DIAGNOSIS — N3946 Mixed incontinence: Secondary | ICD-10-CM | POA: Diagnosis not present

## 2023-08-03 DIAGNOSIS — N3946 Mixed incontinence: Secondary | ICD-10-CM | POA: Diagnosis not present

## 2023-08-03 DIAGNOSIS — R41841 Cognitive communication deficit: Secondary | ICD-10-CM | POA: Diagnosis not present

## 2023-08-03 DIAGNOSIS — M6281 Muscle weakness (generalized): Secondary | ICD-10-CM | POA: Diagnosis not present

## 2023-08-04 DIAGNOSIS — R41841 Cognitive communication deficit: Secondary | ICD-10-CM | POA: Diagnosis not present

## 2023-08-04 DIAGNOSIS — M6281 Muscle weakness (generalized): Secondary | ICD-10-CM | POA: Diagnosis not present

## 2023-08-04 DIAGNOSIS — N3946 Mixed incontinence: Secondary | ICD-10-CM | POA: Diagnosis not present

## 2023-08-08 DIAGNOSIS — N3946 Mixed incontinence: Secondary | ICD-10-CM | POA: Diagnosis not present

## 2023-08-08 DIAGNOSIS — M6281 Muscle weakness (generalized): Secondary | ICD-10-CM | POA: Diagnosis not present

## 2023-08-09 DIAGNOSIS — N3946 Mixed incontinence: Secondary | ICD-10-CM | POA: Diagnosis not present

## 2023-08-09 DIAGNOSIS — M6281 Muscle weakness (generalized): Secondary | ICD-10-CM | POA: Diagnosis not present

## 2023-08-11 IMAGING — DX DG CHEST 1V PORT
1 series · 1 of 1 positions shown · non-contrast
Comparison: 05/19/2017

CLINICAL DATA: Fever unwitnessed fall

EXAM:
PORTABLE CHEST 1 VIEW

[chest ap]
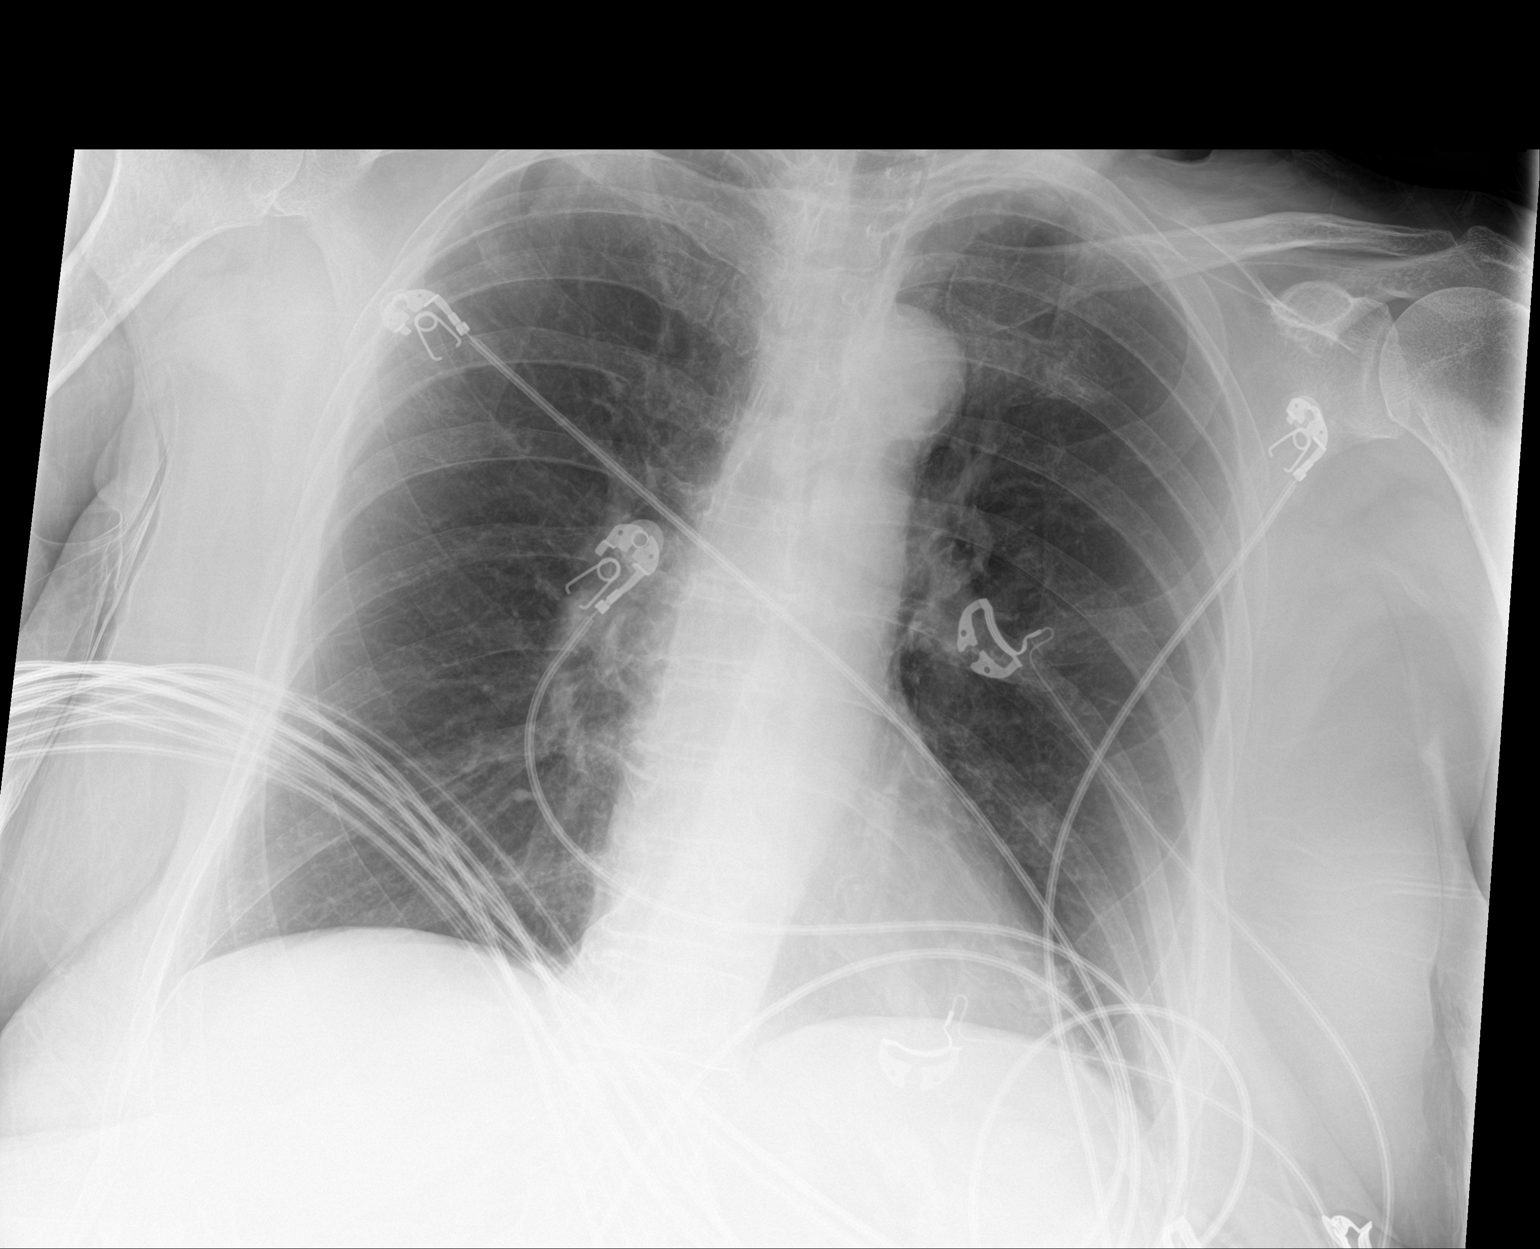

[1 of 1 positions shown; findings below may reference images not displayed]

FINDINGS: The heart size and mediastinal contours are within normal limits.
Aortic atherosclerosis. Both lungs are clear. The visualized
skeletal structures are unremarkable.
IMPRESSION: No active disease.

## 2023-08-11 IMAGING — CT CT HEAD W/O CM
2 of 4 series · 12 of 47 positions shown, 15 images · non-contrast
Comparison: None.

CLINICAL DATA: Head trauma, minor (Age >= 65y); Polytrauma, blunt.
Unwitnessed fall



[Series 6: coronal soft tissue · coronal · 0.31mm/px · 3 of 69 slices shown]
[im 23/69  brain]
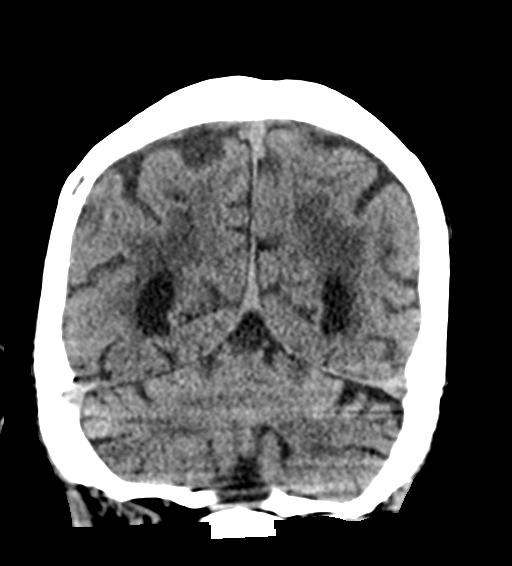
[im 31/69  brain]
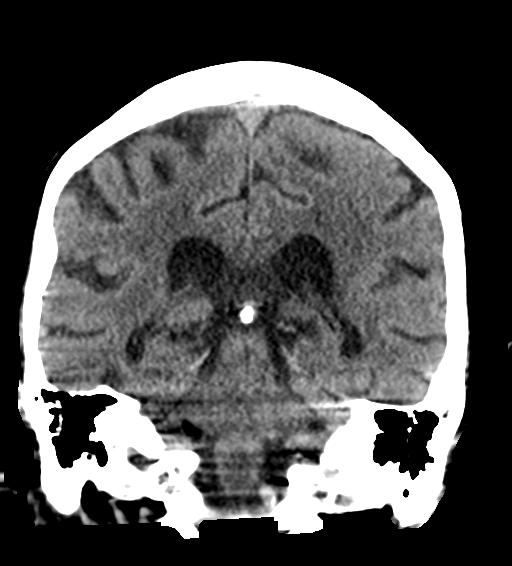
[im 38/69  brain]
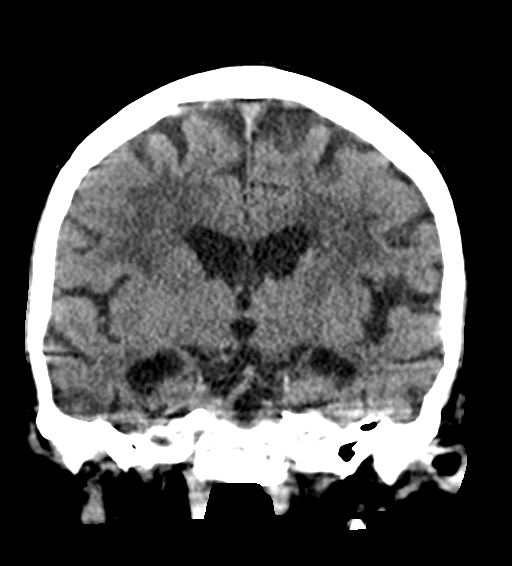

[Series 8: true axial · axial · 0.31mm/px · z∈[-162,-10]mm · 9 of 59 slices shown, 12 images]
[im 4/59  brain]
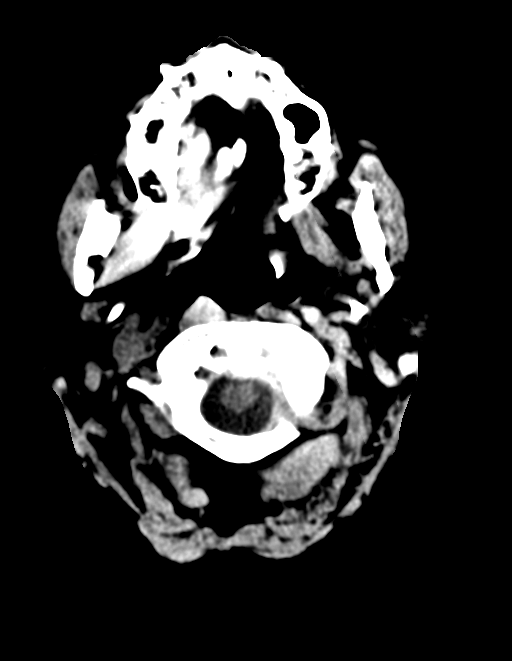
[im 4/59  bone]
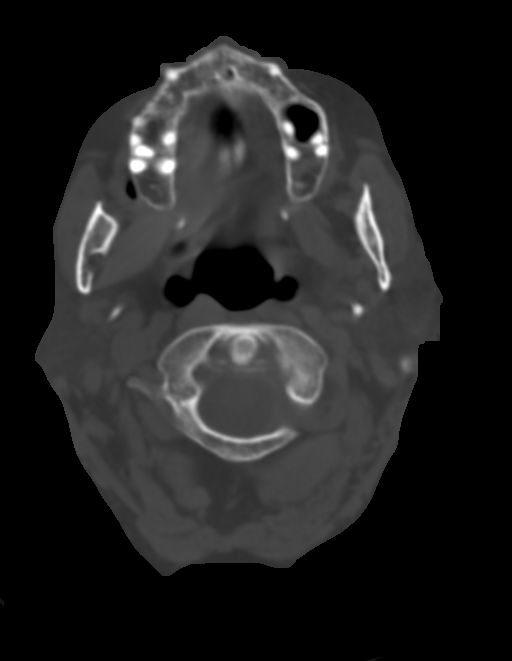
[im 10/59  brain]
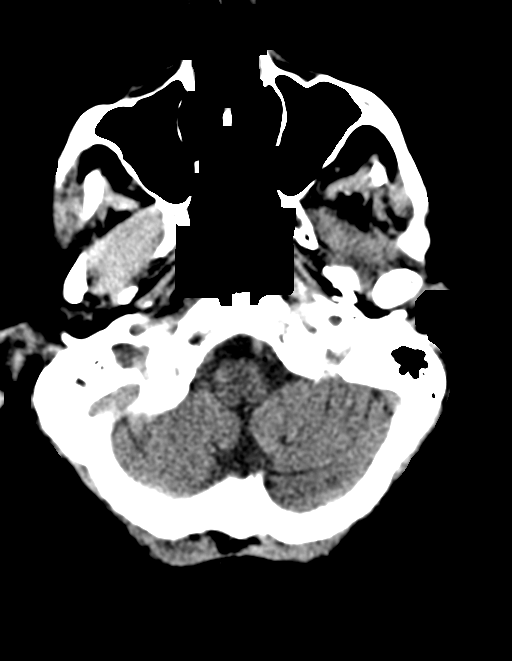
[im 17/59  brain]
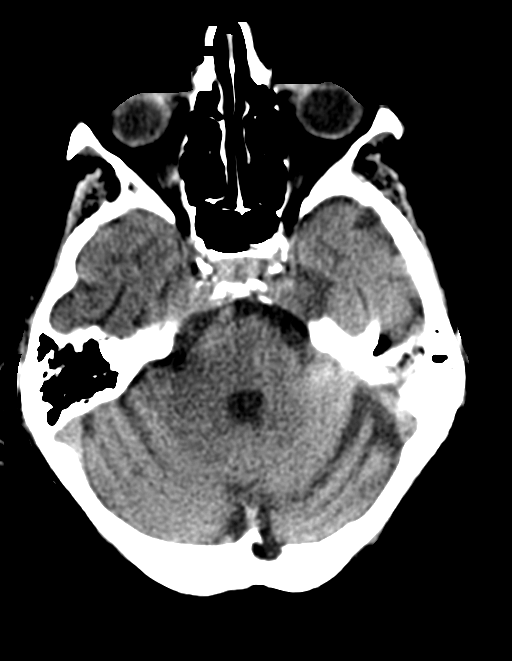
[im 23/59  brain]
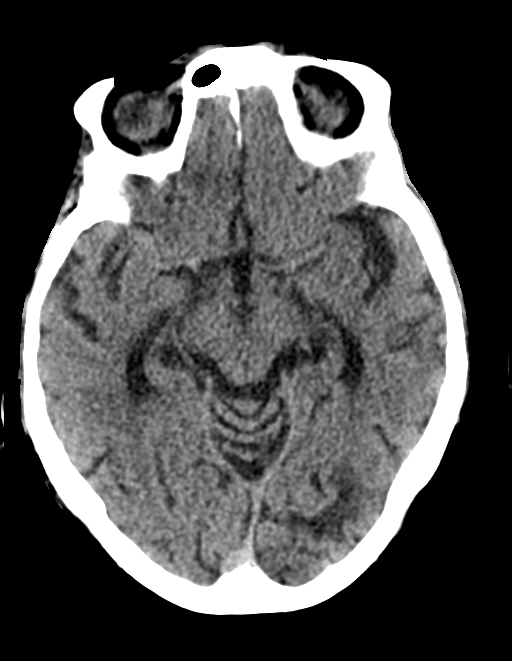
[im 30/59  brain]
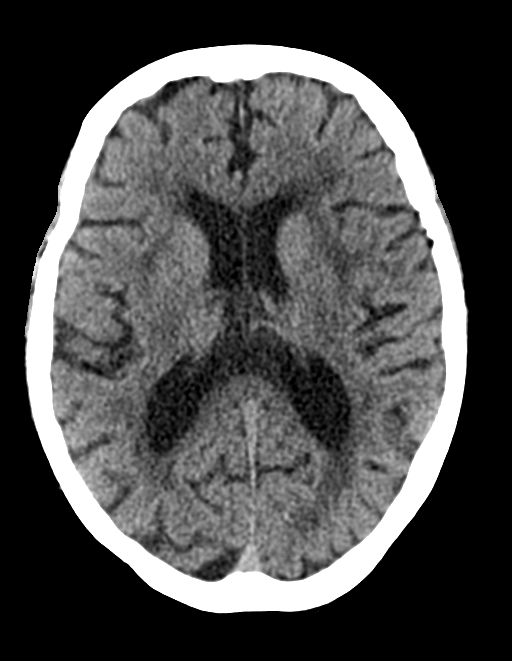
[im 30/59  bone]
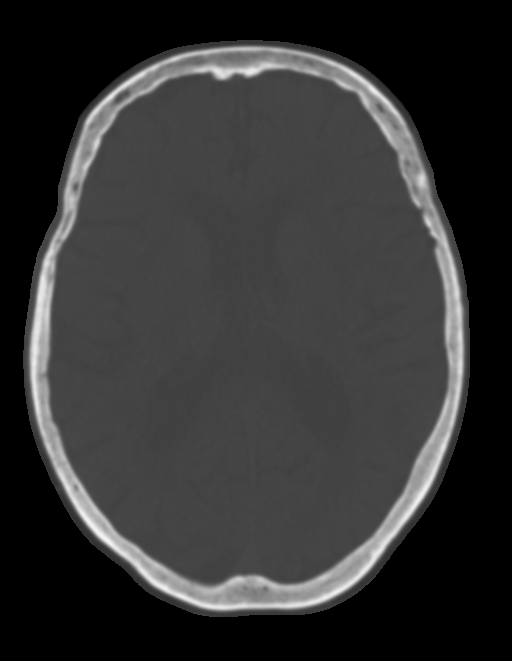
[im 36/59  brain]
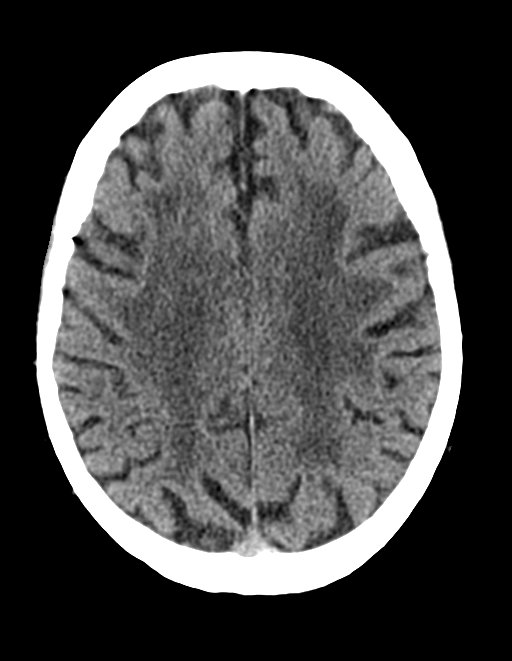
[im 42/59  brain]
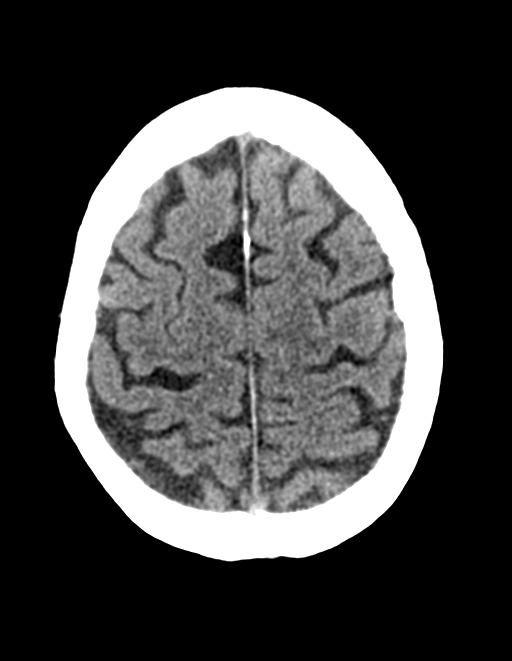
[im 49/59  brain]
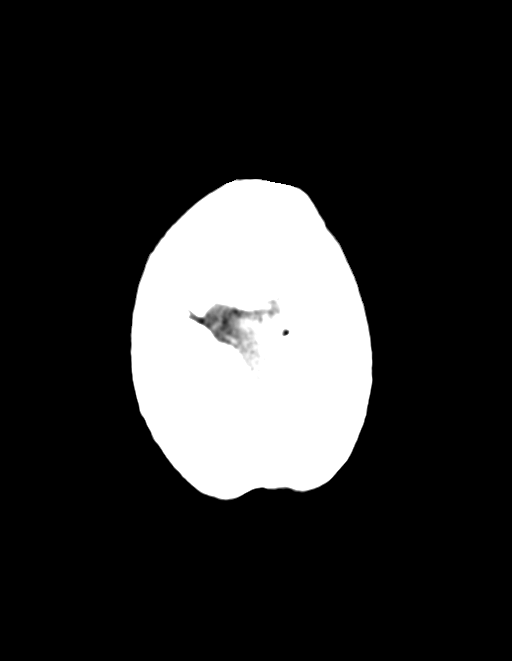
[im 55/59  brain]
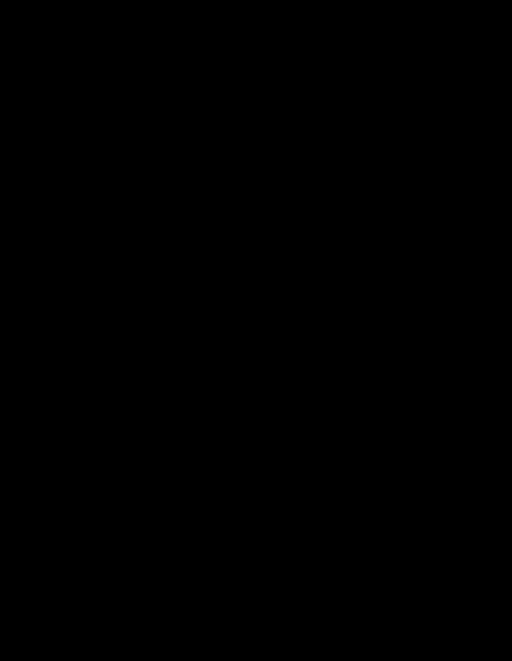
[im 55/59  bone]
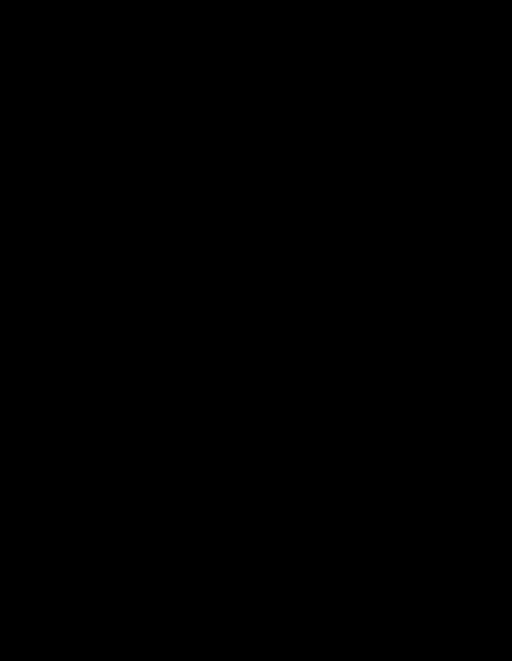

[12 of 47 positions shown; findings below may reference images not displayed]

FINDINGS: CT HEAD FINDINGS

BRAIN:
BRAIN
Cerebral ventricle sizes are concordant with the degree of cerebral
volume loss. Patchy and confluent areas of decreased attenuation are
noted throughout the deep and periventricular white matter of the
cerebral hemispheres bilaterally, compatible with chronic
microvascular ischemic disease.

No evidence of large-territorial acute infarction. No parenchymal
hemorrhage. No mass lesion. No extra-axial collection.

No mass effect or midline shift. No hydrocephalus. Basilar cisterns
are patent.

Vascular: No hyperdense vessel. Atherosclerotic calcifications are
present within the cavernous internal carotid arteries.

Skull: No acute fracture or focal lesion.

Sinuses/Orbits: Trace mucosal thickening of the right maxillary
sinus. Otherwise paranasal sinuses and mastoid air cells are clear.
Bilateral lens replacement. Otherwise the orbits are unremarkable.

Other: None.

CT CERVICAL SPINE FINDINGS

Alignment: Normal.

Skull base and vertebrae: Multilevel moderate degenerative changes
spine. Severe osseous neural foraminal stenosis at the C4-C5 level.
No acute fracture. No aggressive appearing focal osseous lesion or
focal pathologic process.

Soft tissues and spinal canal: No prevertebral fluid or swelling. No
visible canal hematoma.

Upper chest: Unremarkable.

Other: None.
IMPRESSION: 1. No acute intracranial abnormality.
2. No acute displaced fracture or traumatic listhesis of the
cervical spine.

## 2023-08-11 IMAGING — CT CT CERVICAL SPINE W/O CM
3 of 4 series · 10 of 33 positions shown, 12 images · non-contrast
Comparison: None.

CLINICAL DATA: Head trauma, minor (Age >= 65y); Polytrauma, blunt.
Unwitnessed fall



[Series 6: orthogonal bone · axial · 0.23mm/px · z∈[-289,-199]mm · 2 of 114 slices shown, 3 images]
[im 33/114  soft-tissue]
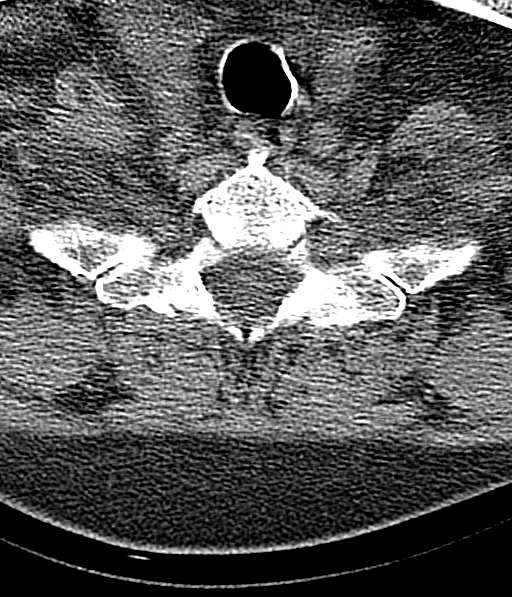
[im 33/114  bone]
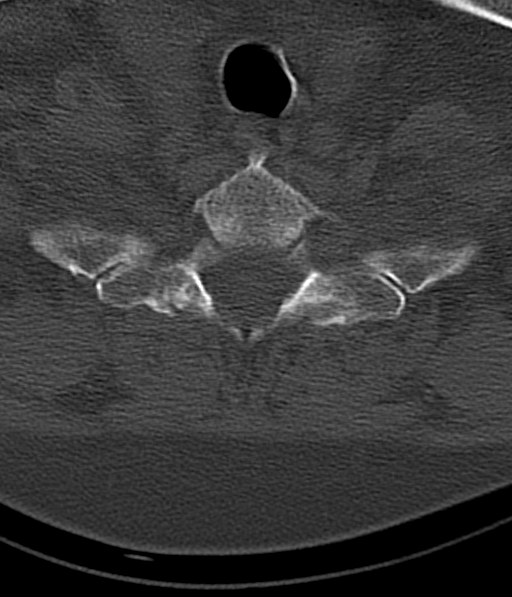
[im 81/114  bone]
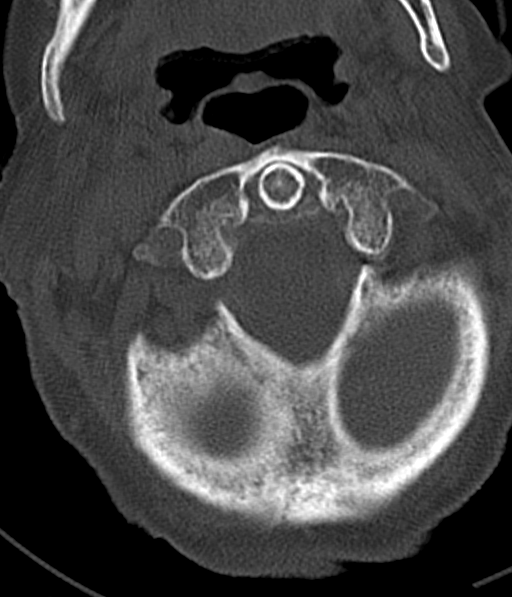

[Series 7: sagittal bone · sagittal · 0.27mm/px · 5 of 61 slices shown, 6 images]
[im 21/61  bone]
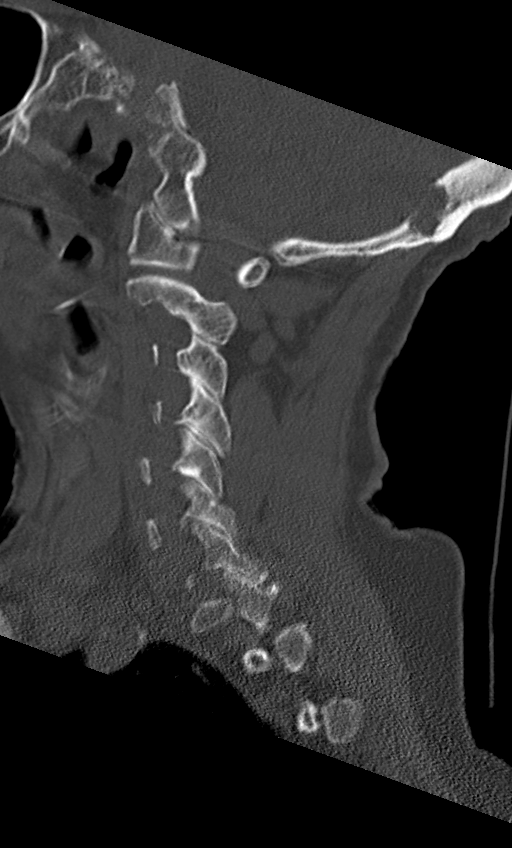
[im 26/61  bone]
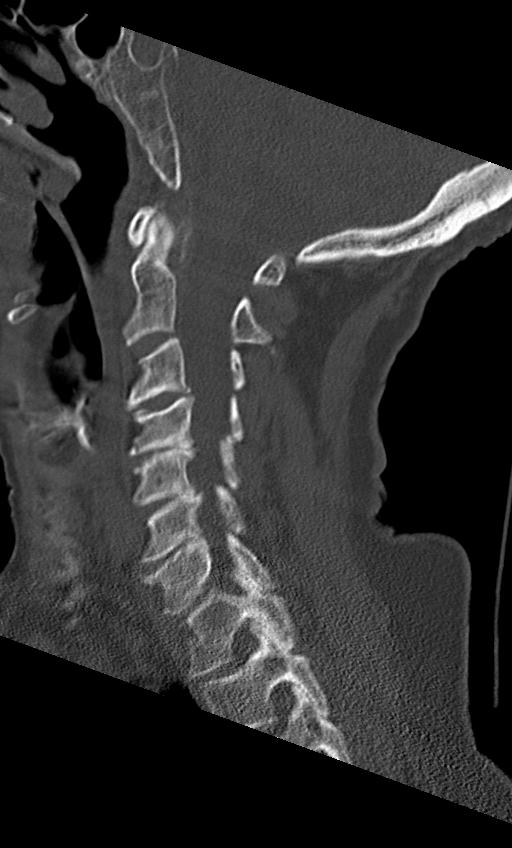
[im 31/61  soft-tissue]
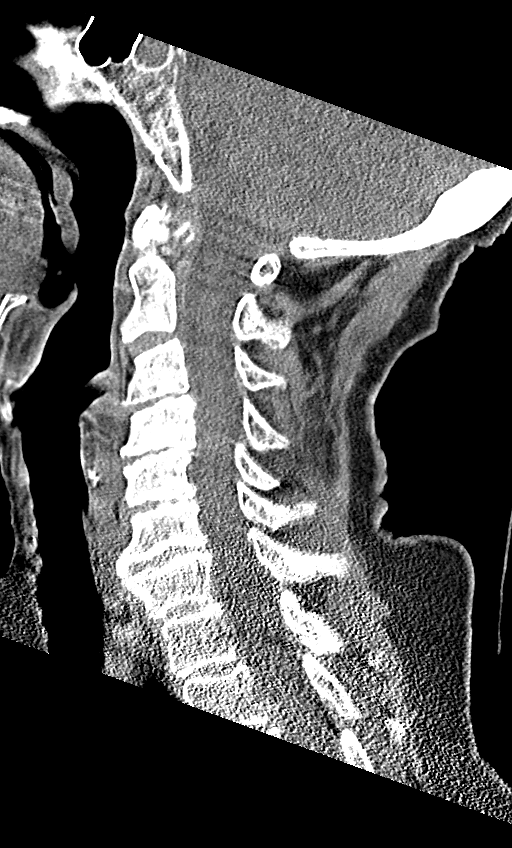
[im 31/61  bone]
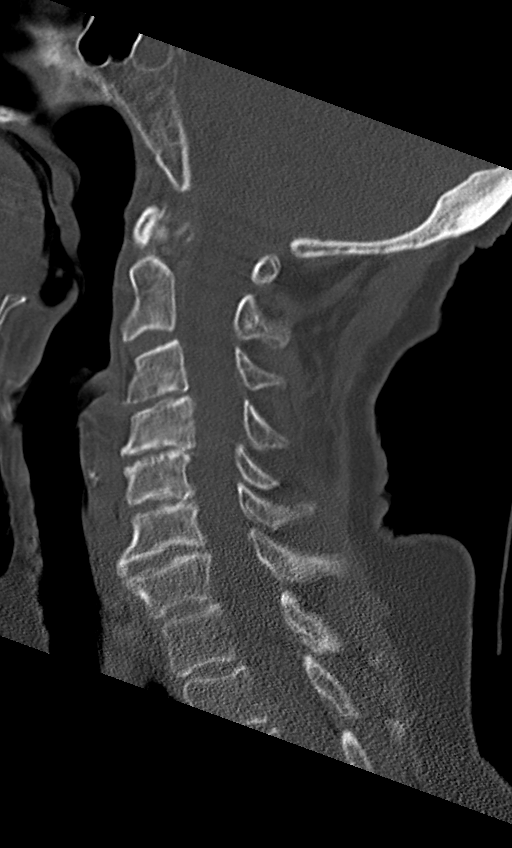
[im 36/61  bone]
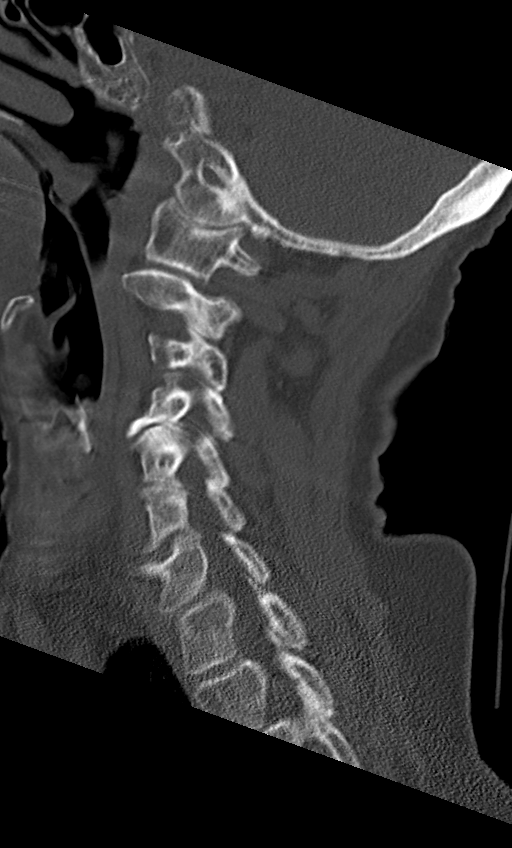
[im 41/61  bone]
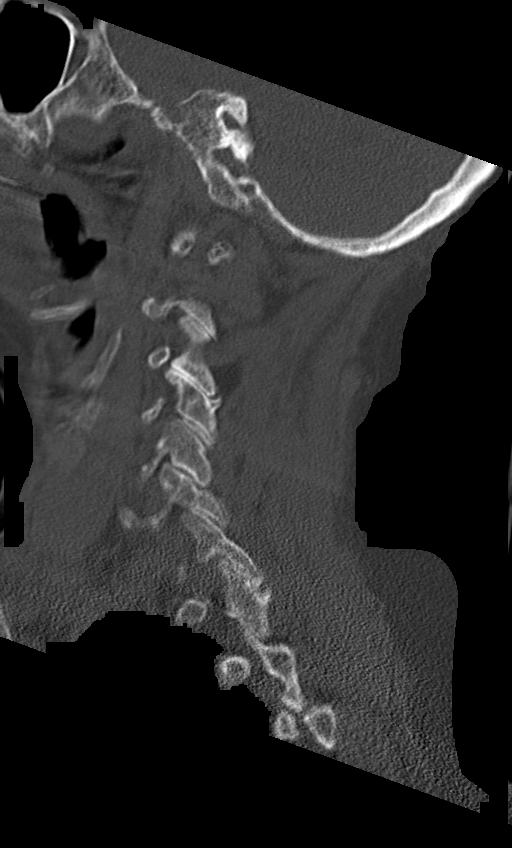

[Series 8: coronal bone · coronal · 0.23mm/px · 3 of 71 slices shown]
[im 15/71  bone]
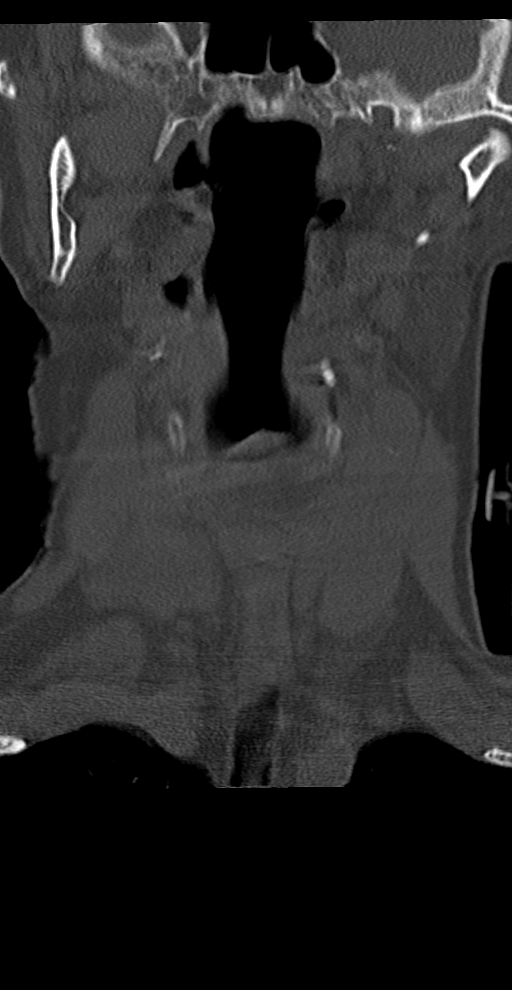
[im 29/71  bone]
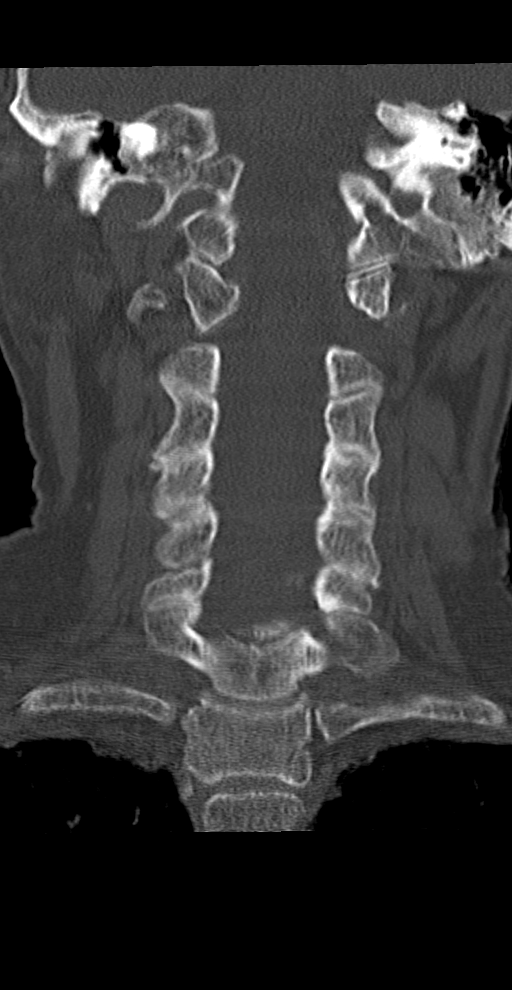
[im 43/71  bone]
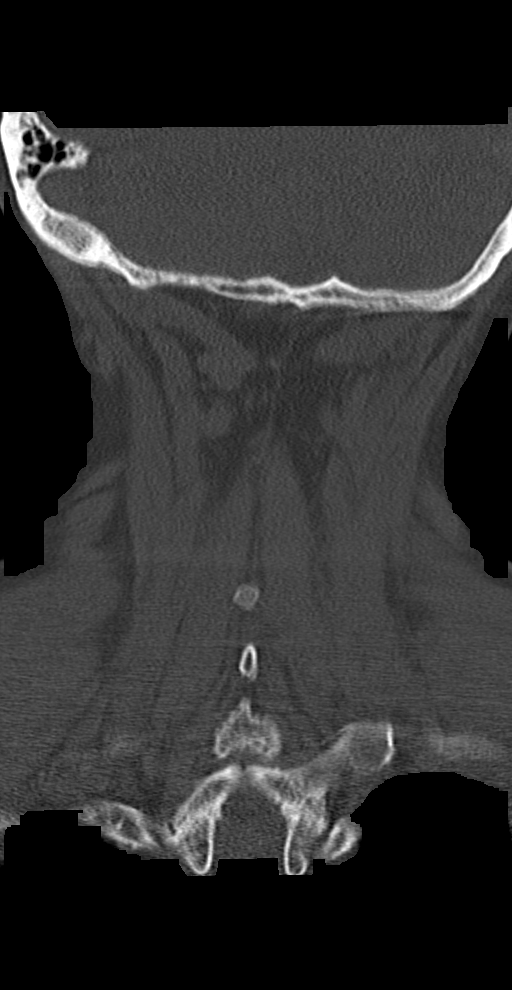

[10 of 33 positions shown; findings below may reference images not displayed]

FINDINGS: CT HEAD FINDINGS

BRAIN:
BRAIN
Cerebral ventricle sizes are concordant with the degree of cerebral
volume loss. Patchy and confluent areas of decreased attenuation are
noted throughout the deep and periventricular white matter of the
cerebral hemispheres bilaterally, compatible with chronic
microvascular ischemic disease.

No evidence of large-territorial acute infarction. No parenchymal
hemorrhage. No mass lesion. No extra-axial collection.

No mass effect or midline shift. No hydrocephalus. Basilar cisterns
are patent.

Vascular: No hyperdense vessel. Atherosclerotic calcifications are
present within the cavernous internal carotid arteries.

Skull: No acute fracture or focal lesion.

Sinuses/Orbits: Trace mucosal thickening of the right maxillary
sinus. Otherwise paranasal sinuses and mastoid air cells are clear.
Bilateral lens replacement. Otherwise the orbits are unremarkable.

Other: None.

CT CERVICAL SPINE FINDINGS

Alignment: Normal.

Skull base and vertebrae: Multilevel moderate degenerative changes
spine. Severe osseous neural foraminal stenosis at the C4-C5 level.
No acute fracture. No aggressive appearing focal osseous lesion or
focal pathologic process.

Soft tissues and spinal canal: No prevertebral fluid or swelling. No
visible canal hematoma.

Upper chest: Unremarkable.

Other: None.
IMPRESSION: 1. No acute intracranial abnormality.
2. No acute displaced fracture or traumatic listhesis of the
cervical spine.

## 2023-08-12 DIAGNOSIS — N3946 Mixed incontinence: Secondary | ICD-10-CM | POA: Diagnosis not present

## 2023-08-12 DIAGNOSIS — M6281 Muscle weakness (generalized): Secondary | ICD-10-CM | POA: Diagnosis not present

## 2023-08-12 IMAGING — RF DG ERCP WO/W SPHINCTEROTOMY
1 series · 6 of 6 positions shown · non-contrast
Comparison: CT abdomen/pelvis 09/12/2021

CLINICAL DATA: Choledocholithiasis

EXAM:
ERCP
TECHNIQUE: Multiple spot images obtained with the fluoroscopic device and
submitted for interpretation post-procedure.
FLUOROSCOPY:
Radiation Exposure Index (as provided by the fluoroscopic device):
67.53 mGy Kerma

[Series 1: run · 6 of 6 slices shown]
[im 1/6]
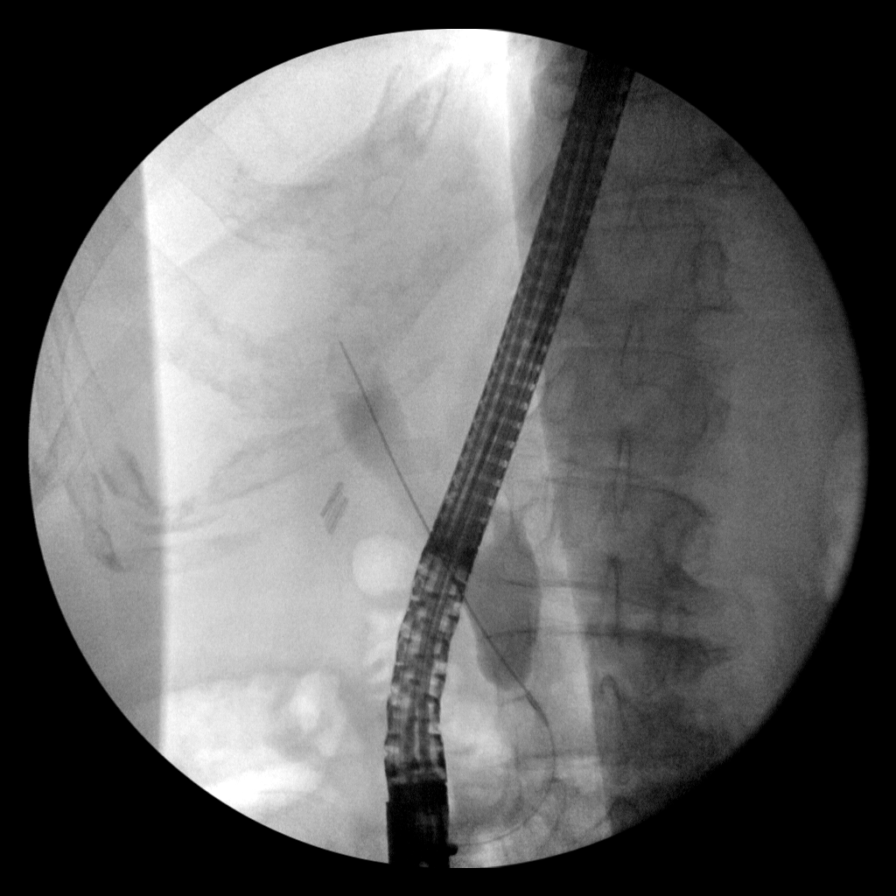
[im 2/6]
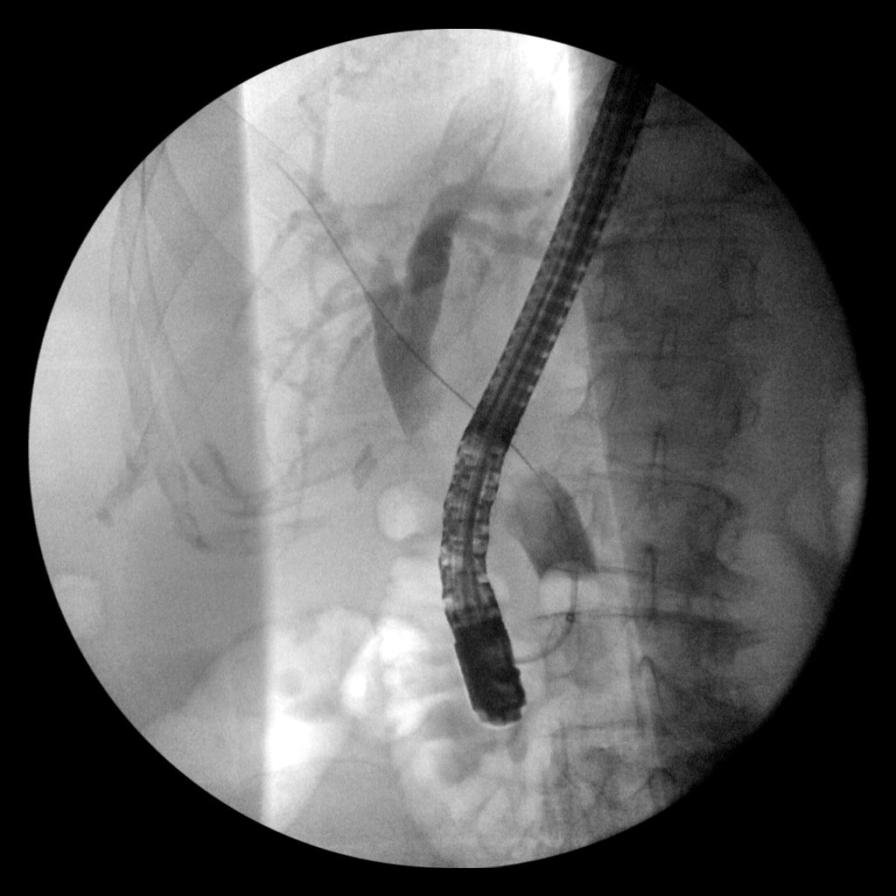
[im 3/6]
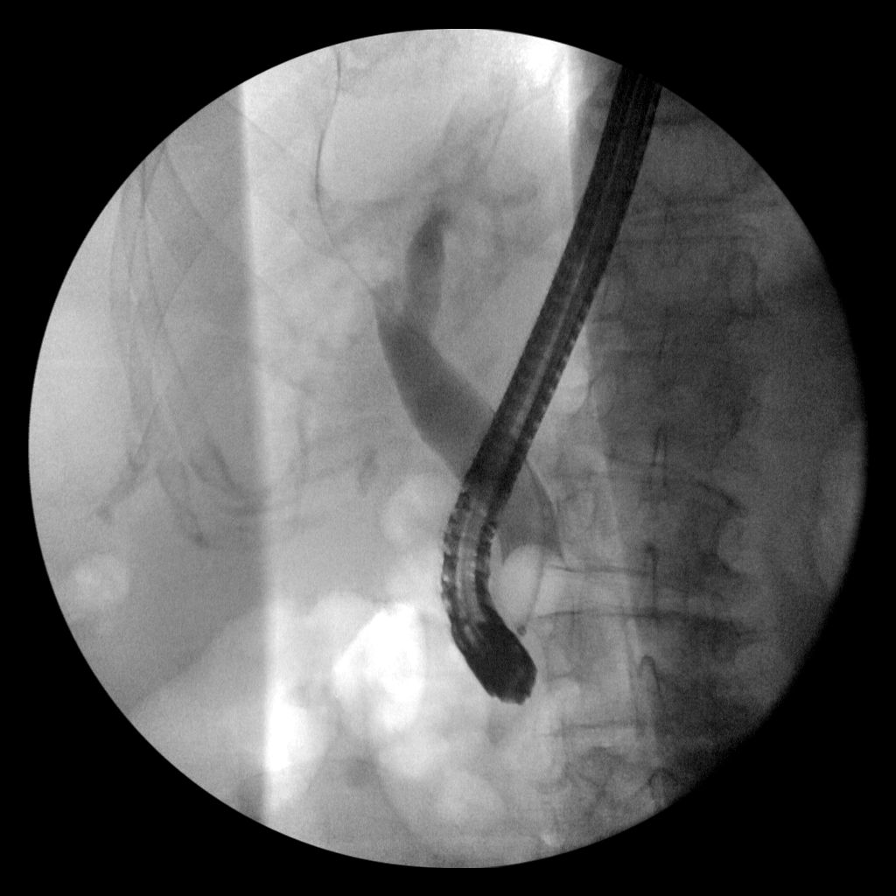
[im 4/6]
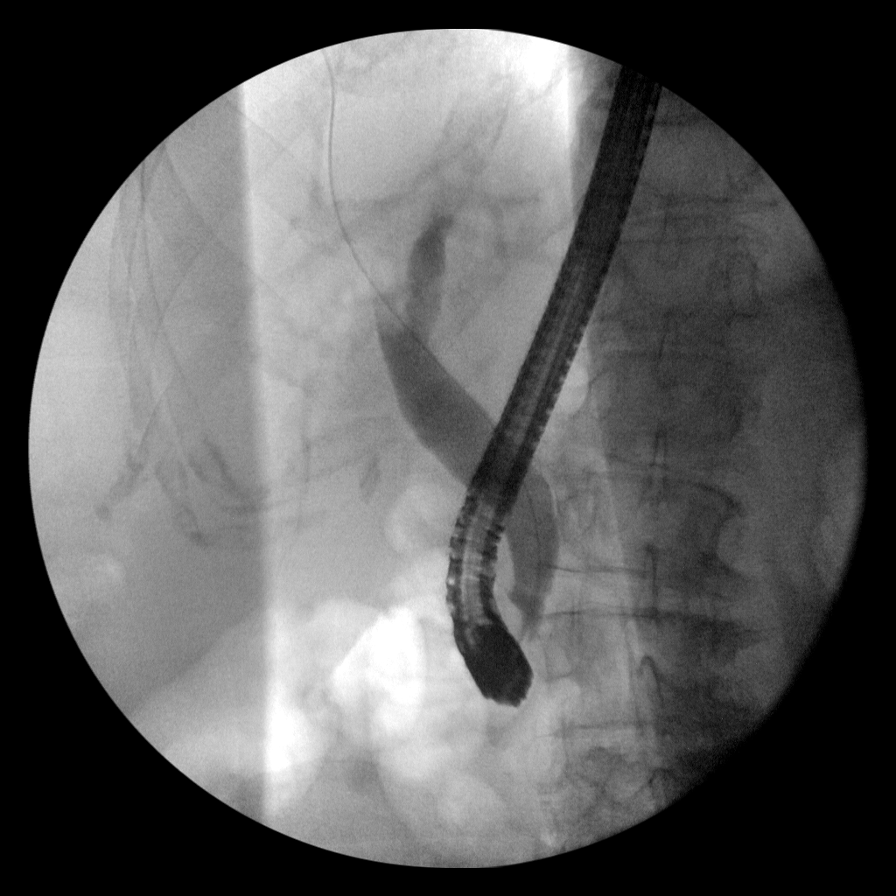
[im 5/6]
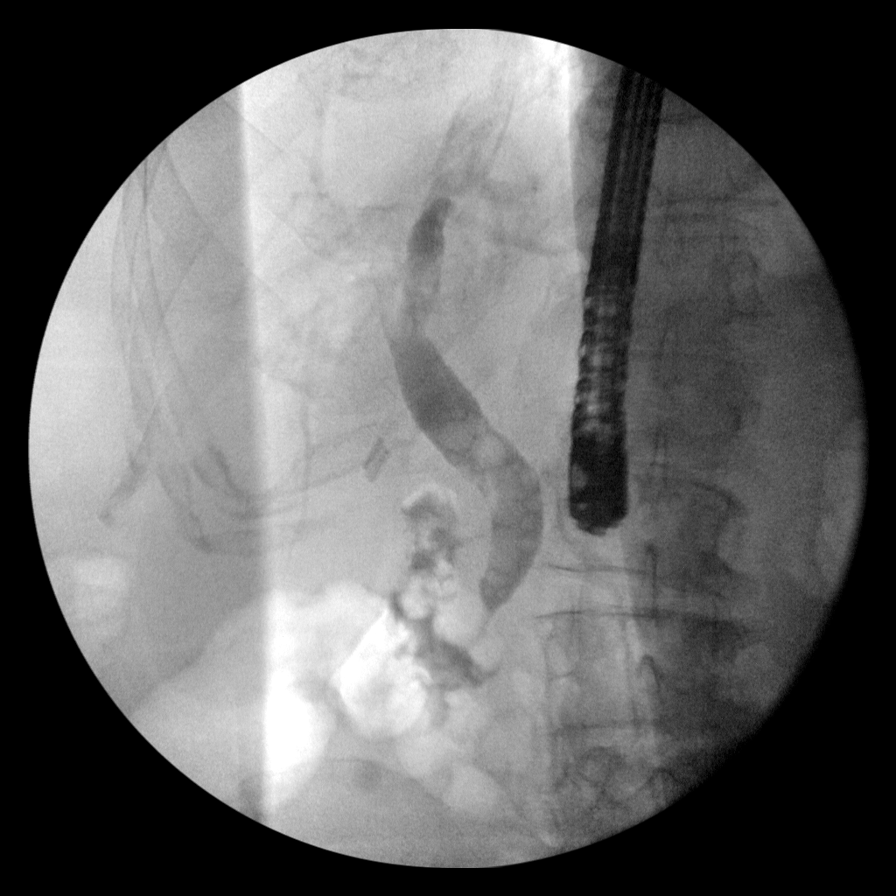
[im 6/6]
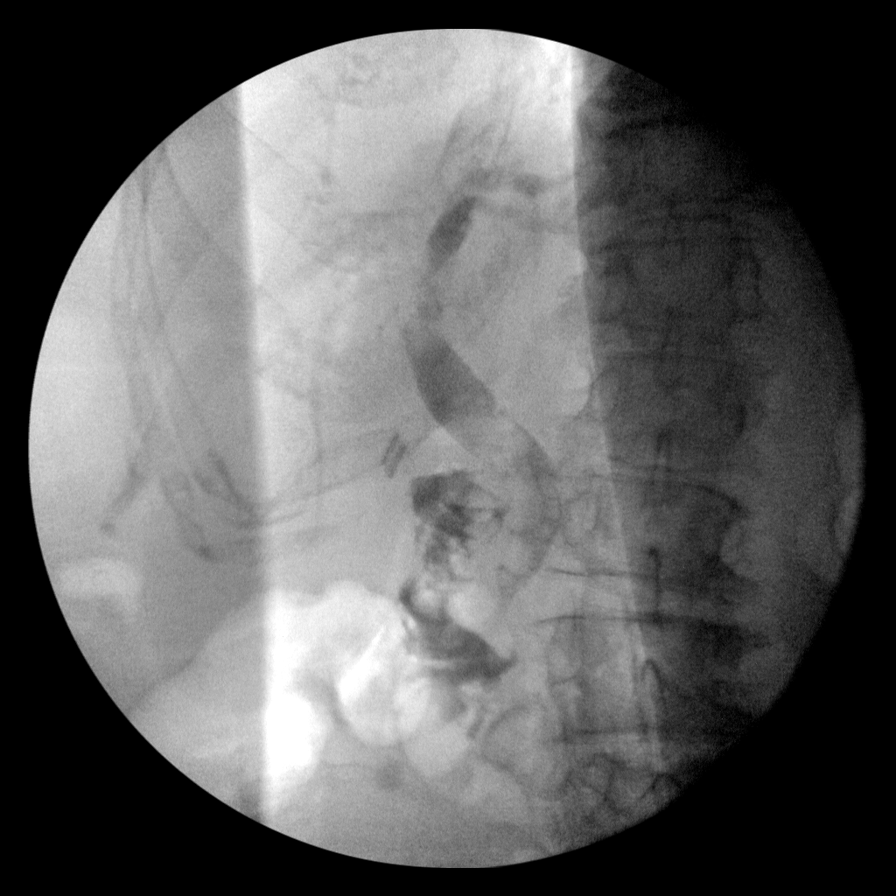

[6 of 6 positions shown; findings below may reference images not displayed]

FINDINGS: Total of 6 intraoperative saved images are submitted for review. The
images demonstrate a flexible duodenal scope in the descending
duodenum with wire cannulation of the common bile duct.
Subsequently, balloon occluded cholangiogram is performed
demonstrating biliary ductal dilation. Filling defects in the distal
common bile duct suggest choledocholithiasis. Subsequent images
document sphincterotomy and balloon sweep of the common duct.
IMPRESSION: 1. Choledocholithiasis.
2. ERCP with sphincterotomy and balloon sweeping of the common duct.

These images were submitted for radiologic interpretation only.
Please see the procedural report for the amount of contrast and the
fluoroscopy time utilized.

## 2023-10-18 DIAGNOSIS — D1801 Hemangioma of skin and subcutaneous tissue: Secondary | ICD-10-CM | POA: Diagnosis not present

## 2023-10-18 DIAGNOSIS — L821 Other seborrheic keratosis: Secondary | ICD-10-CM | POA: Diagnosis not present

## 2023-10-18 DIAGNOSIS — L814 Other melanin hyperpigmentation: Secondary | ICD-10-CM | POA: Diagnosis not present

## 2023-10-20 ENCOUNTER — Non-Acute Institutional Stay: Payer: Self-pay | Admitting: Sports Medicine

## 2023-10-20 DIAGNOSIS — F0390 Unspecified dementia without behavioral disturbance: Secondary | ICD-10-CM | POA: Diagnosis not present

## 2023-10-20 DIAGNOSIS — I10 Essential (primary) hypertension: Secondary | ICD-10-CM

## 2023-10-20 DIAGNOSIS — M159 Polyosteoarthritis, unspecified: Secondary | ICD-10-CM | POA: Diagnosis not present

## 2023-10-20 DIAGNOSIS — K2100 Gastro-esophageal reflux disease with esophagitis, without bleeding: Secondary | ICD-10-CM

## 2023-10-20 DIAGNOSIS — R7989 Other specified abnormal findings of blood chemistry: Secondary | ICD-10-CM

## 2023-10-20 NOTE — Progress Notes (Unsigned)
 Provider:  Dr. Tye Gall Location:  [facility] Place of Service:   Assisted living   PCP: Tye Gall, MD Patient Care Team: Tye Gall, MD as PCP - General (Internal Medicine) Avanell Leigh, MD as Consulting Physician (Cardiology)  Extended Emergency Contact Information Primary Emergency Contact: Avel Bock Address: [address]          [place], [place] [address] [place] Home Phone: [phone] Mobile Phone: [phone] Relation: Niece  Goals of Care: Advanced Directive information    07/07/2023    3:08 PM  Advanced Directives  Does Patient Have a Medical Advance Directive? Yes  Type of Estate agent of [place];Living will;Out of facility DNR (pink MOST or yellow form)  Does patient want to make changes to medical advance directive? No - Patient declined  Copy of Healthcare Power of Attorney in Chart? Yes - validated most recent copy scanned in chart (See row information)        History of Present Illness   89 yr old F with h/o Dementia, Low back pain, CKD, oedema is evaluated for chronic disease management  Pt seen and examined in her room  Seems pleasant and comfortable , does not appear to be in distress She knows her name, not oriented to time or place Can tell me what she had for breakfast  Pt denies fevers, chills, cough, SOB, abdominal pain, nausea, vomiting, dysuria, hematuria, bloody or dark stools.  As per staff pt has intermittent confusion,  she is on safety check. She needs 1 person assistance with ADLS.   Sw spoke with the family and plan is to move her to memory care unit.   Past Medical History:  Diagnosis Date   Arthritis    "right thumb" (01/29/2015)   Chronic shoulder pain    "between my shoulders" (01/29/2015)   Dizziness    Esophageal stricture 2009   Gallstones    GERD (gastroesophageal reflux disease) 2004   Hemorrhoids 2004   Hiatal hernia  2004   History of blood transfusion 1947   "appendix ruptured"   HOH (hard of hearing)    Hypertension    Rhinitis, allergic    Syncopal episodes    Past Surgical History:  Procedure Laterality Date   APPENDECTOMY  1947   "ruptured"   CARDIAC CATHETERIZATION  10/23/1999; 09/2014   EF 60% No significant CAD; patent coronary arteries.   CARPAL TUNNEL RELEASE Right 1990's   CHOLECYSTECTOMY N/A 01/29/2015   Procedure: LAPAROSCOPIC CHOLECYSTECTOMY;  Surgeon: Enid Harry, MD;  Location: [facility] OR;  Service: General;  Laterality: N/A;   DILATION AND CURETTAGE OF UTERUS  "couple"   ERCP N/A 09/13/2021   Procedure: ENDOSCOPIC RETROGRADE CHOLANGIOPANCREATOGRAPHY (ERCP);  Surgeon: Alvis Jourdain, MD;  Location: [facility];  Service: Gastroenterology;  Laterality: N/A;   ESOPHAGOGASTRODUODENOSCOPY (EGD) WITH ESOPHAGEAL DILATION  2-3 times   HEMORROIDECTOMY     KNEE ARTHROSCOPY Bilateral    right x 2   LAPAROSCOPIC CHOLECYSTECTOMY  01/29/2015   LEFT HEART CATHETERIZATION WITH CORONARY ANGIOGRAM N/A 09/16/2014   Procedure: LEFT HEART CATHETERIZATION WITH CORONARY ANGIOGRAM;  Surgeon: Avanell Leigh, MD;  Location: [facility] CATH LAB;  Service: Cardiovascular;  Laterality: N/A;   NM MYOVIEW  LTD  06/01/2012   EF 71%   REMOVAL OF STONES  09/13/2021   Procedure: REMOVAL OF STONES;  Surgeon: Alvis Jourdain, MD;  Location: [facility];  Service: Gastroenterology;;   [address]  09/13/2021   Procedure: [address];  Surgeon: Alvis Jourdain, MD;  Location: [facility] ENDOSCOPY;  Service: Gastroenterology;;   VAGINAL HYSTERECTOMY  1970's    reports that she has never smoked. She has never used smokeless tobacco. She reports that she does not drink alcohol  and does not use drugs. Social History   Socioeconomic History   Marital status: Widowed    Spouse name: Not on file   Number of children: 0   Years of education: Not on file   Highest education level: Not on file  Occupational History   Occupation: retired     Associate Professor: RETIRED    Comment: school cafeteria mgr  Tobacco Use   Smoking status: Never   Smokeless tobacco: Never  Vaping Use   Vaping status: Never Used  Substance and Sexual Activity   Alcohol  use: No    Alcohol /week: 0.0 standard drinks of alcohol    Drug use: No   Sexual activity: Never  Other Topics Concern   Not on file  Social History Narrative   Patient does not consume alcohol  or use tobacco products.   She does drink/eat things with caffeine in it.   Marital status: widow (married in [place])   Patient lives in an assisted living 1 story apartment home   Highest level of education completed was high school.   Past profession: home maker   Exercise: walk 3 x week   Patient has a POA and living   Social Drivers of Corporate investment banker Strain: Not on file  Food Insecurity: Not on file  Transportation Needs: Not on file  Physical Activity: Not on file  Stress: Not on file  Social Connections: Not on file  Intimate Partner Violence: Not on file    Functional Status Survey:    Family History  Problem Relation Age of Onset   Stroke Father 75       light stroke   Heart disease Sister    COPD Sister    Heart disease Brother    Coronary artery disease Neg Hx    Diabetes Neg Hx    Cancer Neg Hx        breast, colon, prostate    Health Maintenance  Topic Date Due   Zoster Vaccines- Shingrix (1 of 2) Never done   DTaP/Tdap/Td (2 - Tdap) 12/21/2015   COVID-19 Vaccine (6 - 2024-25 season) 02/06/2023   INFLUENZA VACCINE  01/06/2024   Medicare Annual Wellness (AWV)  07/06/2024   Pneumonia Vaccine 40+ Years old  Completed   DEXA SCAN  Completed   HPV VACCINES  Aged Out   Meningococcal B Vaccine  Aged Out   MAMMOGRAM  Discontinued    Allergies  Allergen Reactions   Contrast Media [Iodinated Contrast Media] Itching and Rash    Pt covered in rash, red.   Codeine Nausea And Vomiting    in large doses: can tolerate Tussionex    Outpatient Encounter  Medications as of 10/20/2023  Medication Sig   acetaminophen  (TYLENOL ) 325 MG tablet Take 650 mg by mouth every 6 (six) hours as needed for mild pain.   alum & mag hydroxide-simeth (MAALOX PLUS) 400-400-40 MG/5ML suspension Take 10 mLs by mouth every 6 (six) hours as needed for indigestion or heartburn.   amLODipine  (NORVASC ) 5 MG tablet Take 1 tablet (5 mg total) by mouth daily.   benzonatate (TESSALON) 100 MG capsule Take 1 capsule by mouth 3 (three) times daily.   calcium  carbonate (OSCAL) 1500 (600 Ca) MG TABS tablet Take 600 mg of elemental calcium  by mouth daily.  dextromethorphan -guaiFENesin  (ROBITUSSIN-DM) 10-100 MG/5ML liquid Take by mouth every 6 (six) hours as needed for cough.   Ergocalciferol  (VITAMIN D2) 50 MCG (2000 UT) TABS Take by mouth.   fluconazole (DIFLUCAN) 100 MG tablet Take 100 mg by mouth daily. X 3 days   lidocaine  4 % Place 1 patch onto the skin every 12 (twelve) hours as needed (back pain). Remove & Discard patch within 12 hours or as directed by MD   memantine (NAMENDA) 10 MG tablet Take 10 mg by mouth 2 (two) times daily.   Nutritional Supplements (RESOURCE 2.0) LIQD Take by mouth. 180 ml; oral Twice A Day Special Instructions: administer with med pass   nystatin cream (MYCOSTATIN) Apply 1 Application topically 2 (two) times daily. Under breast for rash for 2 weeks   omeprazole  (PRILOSEC) 40 MG capsule Take 40 mg by mouth daily.   ondansetron  (ZOFRAN -ODT) 4 MG disintegrating tablet Take 4 mg by mouth every 6 (six) hours as needed for nausea or vomiting.   triamcinolone  cream (KENALOG) 0.1 % Apply 1 Application topically 2 (two) times daily. For rash x 2 weeks   vitamin B-12 (CYANOCOBALAMIN ) 1000 MCG tablet Take 1,000 mcg by mouth daily.   No facility-administered encounter medications on file as of 10/20/2023.    Review of Systems Negative unless indicated in HPI.  There were no vitals filed for this visit. There is no height or weight on file to calculate  BMI. BP Readings from Last 3 Encounters:  07/28/23 (S) (!) 150/80  07/12/23 130/78  07/07/23 132/74   Wt Readings from Last 3 Encounters:  07/28/23 192 lb (87.1 kg)  07/12/23 192 lb (87.1 kg)  07/07/23 191 lb 3.2 oz (86.7 kg)   Physical Exam Constitutional:      Appearance: Normal appearance.  HENT:     Head: Normocephalic and atraumatic.  Cardiovascular:     Rate and Rhythm: Normal rate and regular rhythm.  Pulmonary:     Effort: Pulmonary effort is normal. No respiratory distress.     Breath sounds: Normal breath sounds. No wheezing.  Abdominal:     General: Bowel sounds are normal. There is no distension.     Tenderness: There is no abdominal tenderness. There is no guarding or rebound.     Comments:    Musculoskeletal:        General: No swelling or tenderness.  Neurological:     Mental Status: She is alert. Mental status is at baseline.     Motor: No weakness.     Labs reviewed: Basic Metabolic Panel: Recent Labs    05/10/23 0000 05/24/23 0000  NA 140 139  K 4.2 4.0  CL 104 106  CO2 26* 27*  BUN 21 30*  CREATININE 1.3* 1.5*  CALCIUM  9.5 8.6*   Liver Function Tests: Recent Labs    05/10/23 0000 05/24/23 0000  AST 14 12*  ALT 8 8  ALKPHOS 96 82  ALBUMIN  6.2* 3.4*   No results for input(s): "LIPASE", "AMYLASE" in the last 8760 hours. No results for input(s): "AMMONIA" in the last 8760 hours. CBC: Recent Labs    05/10/23 0000 05/24/23 0000  WBC 8.3 7.8  NEUTROABS 5,013.00 4,703.00  HGB 14.1 12.9  HCT 43 40  PLT 250 209   Cardiac Enzymes: No results for input(s): "CKTOTAL", "CKMB", "CKMBINDEX", "TROPONINI" in the last 8760 hours. BNP: Invalid input(s): "POCBNP" Lab Results  Component Value Date   HGBA1C 5.8 07/11/2017   Lab Results  Component Value Date  TSH 1.313 09/13/2021   Lab Results  Component Value Date   VITAMINB12 228 04/03/2020   Lab Results  Component Value Date   FOLATE 8.4 04/13/2010   Lab Results  Component  Value Date   IRON 23 08/27/2020   TIBC 368 08/27/2020   FERRITIN 6 08/27/2020    Imaging and Procedures obtained prior to SNF admission: DG ERCP Result Date: 09/14/2021 CLINICAL DATA:  Choledocholithiasis EXAM: ERCP TECHNIQUE: Multiple spot images obtained with the fluoroscopic device and submitted for interpretation post-procedure. FLUOROSCOPY: Radiation Exposure Index (as provided by the fluoroscopic device): 67.53 mGy Kerma COMPARISON:  CT abdomen/pelvis 09/12/2021 FINDINGS: Total of 6 intraoperative saved images are submitted for review. The images demonstrate a flexible duodenal scope in the descending duodenum with wire cannulation of the common bile duct. Subsequently, balloon occluded cholangiogram is performed demonstrating biliary ductal dilation. Filling defects in the distal common bile duct suggest choledocholithiasis. Subsequent images document sphincterotomy and balloon sweep of the common duct. IMPRESSION: 1. Choledocholithiasis. 2. ERCP with sphincterotomy and balloon sweeping of the common duct. These images were submitted for radiologic interpretation only. Please see the procedural report for the amount of contrast and the fluoroscopy time utilized. Electronically Signed   By: Fernando Hoyer M.D.   On: 09/14/2021 08:03   CT Abdomen Pelvis Wo Contrast Result Date: 09/12/2021 CLINICAL DATA:  Nausea/vomiting. unwitnessed fall at facility. Was found face down with vomit around her. EXAM: CT ABDOMEN AND PELVIS WITHOUT CONTRAST TECHNIQUE: Multidetector CT imaging of the abdomen and pelvis was performed following the standard protocol without IV contrast. RADIATION DOSE REDUCTION: This exam was performed according to the departmental dose-optimization program which includes automated exposure control, adjustment of the mA and/or kV according to patient size and/or use of iterative reconstruction technique. COMPARISON:  None. FINDINGS: Slightly limited evaluation due to motion artifact.  Lower chest: No acute abnormality.  Tiny hiatal hernia. Liver: Mildly enlarged measuring up to 18 cm.  No focal lesion. Biliary System: Status post cholecystectomy. Enlarged common bile duct measuring up to 1.4 cm with associated intraluminal hyperdensities measuring approximately and 0.70.8 cm. Pancreas: Diffusely atrophic. No focal lesion. Query slight fat stranding along the proximal pancreas (2:31). No main pancreatic ductal dilatation. Spleen: Not enlarged. No focal lesion. Adrenal Glands: No nodularity bilaterally. Kidneys: No hydroureteronephrosis. No nephroureterolithiasis. No contour deforming renal mass. Anterior urinary bladder diverticula. Otherwise the urinary bladder is unremarkable. Bowel: No small or large bowel wall thickening or dilatation. Status post appendectomy. Mesentery, Omentum, and Peritoneum: No simple free fluid ascites. No pneumoperitoneum. No mesenteric hematoma identified. No organized fluid collection. Pelvic Organs: Status post hysterectomy. Bilateral adnexal regions are unremarkable. Lymph Nodes: No abdominal, pelvic, inguinal lymphadenopathy. Vasculature: Severe atherosclerotic plaque. No abdominal aorta or iliac aneurysm. Musculoskeletal: No significant soft tissue hematoma. Small to moderate umbilical hernia containing fat with abdominal wall defect of 3.3 cm. No acute pelvic fracture. No spinal fracture. Multilevel degenerative changes spine. IMPRESSION: 1. Findings suggestive of choledocholithiasis in a patient status post cholecystectomy. Correlate with liver function tests. 2. Query slight fat stranding along the proximal pancreas with limited evaluation due to motion artifact. Correlate with lipase levels. 3. No acute traumatic injury to the abdomen or pelvis on this noncontrast study. 4. No acute fracture or traumatic malalignment of the lumbar spine. Other imaging findings of potential clinical significance: 1. Small hiatal hernia. 2. Fat containing umbilical hernia. A  findings suggestive ischemia or bowel obstruction. 3. Aortic Atherosclerosis (ICD10-I70.0). 4. Status post cholecystectomy, appendectomy, hysterectomy. Electronically Signed  By: Morgane  Naveau M.D.   On: 09/12/2021 23:37   CT Head Wo Contrast Result Date: 09/12/2021 CLINICAL DATA:  Head trauma, minor (Age >= 65y); Polytrauma, blunt. Unwitnessed fall EXAM: CT HEAD WITHOUT CONTRAST CT CERVICAL SPINE WITHOUT CONTRAST TECHNIQUE: Multidetector CT imaging of the head and cervical spine was performed following the standard protocol without intravenous contrast. Multiplanar CT image reconstructions of the cervical spine were also generated. RADIATION DOSE REDUCTION: This exam was performed according to the departmental dose-optimization program which includes automated exposure control, adjustment of the mA and/or kV according to patient size and/or use of iterative reconstruction technique. COMPARISON:  None. FINDINGS: CT HEAD FINDINGS BRAIN: BRAIN Cerebral ventricle sizes are concordant with the degree of cerebral volume loss. Patchy and confluent areas of decreased attenuation are noted throughout the deep and periventricular white matter of the cerebral hemispheres bilaterally, compatible with chronic microvascular ischemic disease. No evidence of large-territorial acute infarction. No parenchymal hemorrhage. No mass lesion. No extra-axial collection. No mass effect or midline shift. No hydrocephalus. Basilar cisterns are patent. Vascular: No hyperdense vessel. Atherosclerotic calcifications are present within the cavernous internal carotid arteries. Skull: No acute fracture or focal lesion. Sinuses/Orbits: Trace mucosal thickening of the right maxillary sinus. Otherwise paranasal sinuses and mastoid air cells are clear. Bilateral lens replacement. Otherwise the orbits are unremarkable. Other: None. CT CERVICAL SPINE FINDINGS Alignment: Normal. Skull base and vertebrae: Multilevel moderate degenerative changes  spine. Severe osseous neural foraminal stenosis at the C4-C5 level. No acute fracture. No aggressive appearing focal osseous lesion or focal pathologic process. Soft tissues and spinal canal: No prevertebral fluid or swelling. No visible canal hematoma. Upper chest: Unremarkable. Other: None. IMPRESSION: 1. No acute intracranial abnormality. 2. No acute displaced fracture or traumatic listhesis of the cervical spine. Electronically Signed   By: Morgane  Naveau M.D.   On: 09/12/2021 23:25   CT Cervical Spine Wo Contrast Result Date: 09/12/2021 CLINICAL DATA:  Head trauma, minor (Age >= 65y); Polytrauma, blunt. Unwitnessed fall EXAM: CT HEAD WITHOUT CONTRAST CT CERVICAL SPINE WITHOUT CONTRAST TECHNIQUE: Multidetector CT imaging of the head and cervical spine was performed following the standard protocol without intravenous contrast. Multiplanar CT image reconstructions of the cervical spine were also generated. RADIATION DOSE REDUCTION: This exam was performed according to the departmental dose-optimization program which includes automated exposure control, adjustment of the mA and/or kV according to patient size and/or use of iterative reconstruction technique. COMPARISON:  None. FINDINGS: CT HEAD FINDINGS BRAIN: BRAIN Cerebral ventricle sizes are concordant with the degree of cerebral volume loss. Patchy and confluent areas of decreased attenuation are noted throughout the deep and periventricular white matter of the cerebral hemispheres bilaterally, compatible with chronic microvascular ischemic disease. No evidence of large-territorial acute infarction. No parenchymal hemorrhage. No mass lesion. No extra-axial collection. No mass effect or midline shift. No hydrocephalus. Basilar cisterns are patent. Vascular: No hyperdense vessel. Atherosclerotic calcifications are present within the cavernous internal carotid arteries. Skull: No acute fracture or focal lesion. Sinuses/Orbits: Trace mucosal thickening of the  right maxillary sinus. Otherwise paranasal sinuses and mastoid air cells are clear. Bilateral lens replacement. Otherwise the orbits are unremarkable. Other: None. CT CERVICAL SPINE FINDINGS Alignment: Normal. Skull base and vertebrae: Multilevel moderate degenerative changes spine. Severe osseous neural foraminal stenosis at the C4-C5 level. No acute fracture. No aggressive appearing focal osseous lesion or focal pathologic process. Soft tissues and spinal canal: No prevertebral fluid or swelling. No visible canal hematoma. Upper chest: Unremarkable. Other: None. IMPRESSION: 1. No  acute intracranial abnormality. 2. No acute displaced fracture or traumatic listhesis of the cervical spine. Electronically Signed   By: Morgane  Naveau M.D.   On: 09/12/2021 23:25   DG Chest Port 1 View Result Date: 09/12/2021 CLINICAL DATA:  Fever unwitnessed fall EXAM: PORTABLE CHEST 1 VIEW COMPARISON:  05/19/2017 FINDINGS: The heart size and mediastinal contours are within normal limits. Aortic atherosclerosis. Both lungs are clear. The visualized skeletal structures are unremarkable. IMPRESSION: No active disease. Electronically Signed   By: Esmeralda Hedge M.D.   On: 09/12/2021 22:15    Assessment and Plan Assessment & Plan   HTN  Bp high  Will increase amlodipine  to 10 mg  Will check labs next week   GERD Denies acid reflux Cont with antacid  Major Neurocognitive disorder Cont with supportive care Increase cognitively engaging activities   OA  Take tylenol  prn for pain   Low vit D levels Take vit D supplements    30 min Total time spent for obtaining history,  performing a medically appropriate examination and evaluation, reviewing the tests,   documenting clinical information in the electronic or other health record,   ,care coordination (not separately reported)

## 2023-10-21 ENCOUNTER — Encounter: Payer: Self-pay | Admitting: Sports Medicine

## 2023-10-27 DIAGNOSIS — I10 Essential (primary) hypertension: Secondary | ICD-10-CM | POA: Diagnosis not present

## 2023-10-27 LAB — BASIC METABOLIC PANEL WITH GFR
BUN: 19 (ref 4–21)
CO2: 28 — AB (ref 13–22)
Chloride: 101 (ref 99–108)
Creatinine: 1.2 — AB (ref 0.5–1.1)
Glucose: 82
Potassium: 4.3 meq/L (ref 3.5–5.1)
Sodium: 136 — AB (ref 137–147)

## 2023-10-27 LAB — CBC: RBC: 3.74 — AB (ref 3.87–5.11)

## 2023-10-27 LAB — CBC AND DIFFERENTIAL
HCT: 38 (ref 36–46)
Hemoglobin: 12.2 (ref 12.0–16.0)
Platelets: 228 10*3/uL (ref 150–400)
WBC: 7.6

## 2023-10-27 LAB — COMPREHENSIVE METABOLIC PANEL WITH GFR
Calcium: 9 (ref 8.7–10.7)
eGFR: 44

## 2023-11-10 ENCOUNTER — Non-Acute Institutional Stay (SKILLED_NURSING_FACILITY): Payer: Self-pay | Admitting: Sports Medicine

## 2023-11-10 DIAGNOSIS — K219 Gastro-esophageal reflux disease without esophagitis: Secondary | ICD-10-CM

## 2023-11-10 DIAGNOSIS — E538 Deficiency of other specified B group vitamins: Secondary | ICD-10-CM | POA: Diagnosis not present

## 2023-11-10 DIAGNOSIS — F0390 Unspecified dementia without behavioral disturbance: Secondary | ICD-10-CM | POA: Diagnosis not present

## 2023-11-10 DIAGNOSIS — E559 Vitamin D deficiency, unspecified: Secondary | ICD-10-CM | POA: Diagnosis not present

## 2023-11-10 DIAGNOSIS — I10 Essential (primary) hypertension: Secondary | ICD-10-CM

## 2023-11-10 DIAGNOSIS — M159 Polyosteoarthritis, unspecified: Secondary | ICD-10-CM

## 2023-11-10 NOTE — Progress Notes (Unsigned)
 Provider:  Dr. Tye Gall Location:  [facility] Place of Service:   Memory care   PCP: Tye Gall, MD Patient Care Team: Tye Gall, MD as PCP - General (Internal Medicine) Avanell Leigh, MD as Consulting Physician (Cardiology)  Extended Emergency Contact Information Primary Emergency Contact: Avel Bock Address: [address]          [place], [place] [address] [place] Home Phone: [phone] Mobile Phone: [phone] Relation: Niece  Goals of Care: Advanced Directive information    07/07/2023    3:08 PM  Advanced Directives  Does Patient Have a Medical Advance Directive? Yes  Type of Estate agent of [place];Living will;Out of facility DNR (pink MOST or yellow form)  Does patient want to make changes to medical advance directive? No - Patient declined  Copy of Healthcare Power of Attorney in Chart? Yes - validated most recent copy scanned in chart (See row information)      No chief complaint on file.     History of Present Illness  89 yr old F with h/o Dementia, Low back pain, CKD, oedema is evaluated for admission to Memory care unit from Assisted living. Pt seen and examined in her room  She is sitting in recliner chair Seems pleasant and comfortable Does not appear to be in distress Pt knows her name, not oriented to time or place She can remember what she had for breakfast  Pt states that she is adjusting well in her new room  No concerns as per nursing staff  Pt ambulates with a walker      Past Medical History:  Diagnosis Date   Arthritis    "right thumb" (01/29/2015)   Chronic shoulder pain    "between my shoulders" (01/29/2015)   Dizziness    Esophageal stricture 2009   Gallstones    GERD (gastroesophageal reflux disease) 2004   Hemorrhoids 2004   Hiatal hernia 2004   History of blood transfusion 1947   "appendix ruptured"   HOH (hard of hearing)     Hypertension    Rhinitis, allergic    Syncopal episodes    Past Surgical History:  Procedure Laterality Date   APPENDECTOMY  1947   "ruptured"   CARDIAC CATHETERIZATION  10/23/1999; 09/2014   EF 60% No significant CAD; patent coronary arteries.   CARPAL TUNNEL RELEASE Right 1990's   CHOLECYSTECTOMY N/A 01/29/2015   Procedure: LAPAROSCOPIC CHOLECYSTECTOMY;  Surgeon: Enid Harry, MD;  Location: [facility] OR;  Service: General;  Laterality: N/A;   DILATION AND CURETTAGE OF UTERUS  "couple"   ERCP N/A 09/13/2021   Procedure: ENDOSCOPIC RETROGRADE CHOLANGIOPANCREATOGRAPHY (ERCP);  Surgeon: Alvis Jourdain, MD;  Location: [facility];  Service: Gastroenterology;  Laterality: N/A;   ESOPHAGOGASTRODUODENOSCOPY (EGD) WITH ESOPHAGEAL DILATION  2-3 times   HEMORROIDECTOMY     KNEE ARTHROSCOPY Bilateral    right x 2   LAPAROSCOPIC CHOLECYSTECTOMY  01/29/2015   LEFT HEART CATHETERIZATION WITH CORONARY ANGIOGRAM N/A 09/16/2014   Procedure: LEFT HEART CATHETERIZATION WITH CORONARY ANGIOGRAM;  Surgeon: Avanell Leigh, MD;  Location: [facility] CATH LAB;  Service: Cardiovascular;  Laterality: N/A;   NM MYOVIEW  LTD  06/01/2012   EF 71%   REMOVAL OF STONES  09/13/2021   Procedure: REMOVAL OF STONES;  Surgeon: Alvis Jourdain, MD;  Location: [facility];  Service: Gastroenterology;;   [address]  09/13/2021   Procedure: [address];  Surgeon: Alvis Jourdain, MD;  Location: [facility] ENDOSCOPY;  Service: Gastroenterology;;   VAGINAL HYSTERECTOMY  1970's    reports that she has never smoked. She has never used smokeless tobacco. She reports that she does not drink alcohol  and does not use drugs. 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   She does drink/eat things with caffeine in it.   Marital status: widow (married in [place])   Patient lives in an assisted living 1 story apartment home   Highest level of education completed was high school.   Past profession: home maker   Exercise: walk 3 x week   Patient has a POA and living   Social Drivers of Corporate investment banker Strain: Not on file  Food Insecurity: Not on file  Transportation Needs: Not on file  Physical Activity: Not on file  Stress: Not on file  Social Connections: Not on file  Intimate Partner Violence: Not on file    Functional Status Survey:    Family History  Problem Relation Age of Onset   Stroke Father 73       light stroke   Heart disease Sister    COPD Sister    Heart disease Brother    Coronary artery disease Neg Hx    Diabetes Neg Hx    Cancer Neg Hx        breast, colon, prostate    Health Maintenance  Topic Date Due   Zoster Vaccines- Shingrix (1 of 2) Never done   DTaP/Tdap/Td (2 - Tdap) 12/21/2015   COVID-19 Vaccine (6 - 2024-25 season) 02/06/2023   INFLUENZA VACCINE  01/06/2024   Medicare Annual Wellness (AWV)  07/06/2024   Pneumonia Vaccine 5+ Years old  Completed   DEXA SCAN  Completed   HPV VACCINES  Aged Out   Meningococcal B Vaccine  Aged Out   MAMMOGRAM  Discontinued    Allergies  Allergen Reactions   Contrast Media [Iodinated Contrast Media] Itching and Rash    Pt covered in rash, red.   Codeine Nausea And Vomiting    in large doses: can tolerate Tussionex    Outpatient Encounter Medications as of 11/10/2023  Medication Sig   acetaminophen  (TYLENOL ) 325 MG tablet Take 650  mg by mouth every 6 (six) hours as needed for mild pain.   alum & mag hydroxide-simeth (MAALOX PLUS) 400-400-40 MG/5ML suspension Take 10 mLs by mouth every 6 (six) hours as needed for indigestion or heartburn.   amLODipine  (NORVASC ) 5 MG tablet Take 1 tablet (5 mg total) by mouth daily.   benzonatate (TESSALON) 100 MG capsule Take 1 capsule by mouth 3 (three) times daily.   calcium  carbonate (OSCAL) 1500 (600 Ca) MG TABS tablet Take 600 mg of elemental calcium  by mouth daily.   dextromethorphan -guaiFENesin  (ROBITUSSIN-DM) 10-100 MG/5ML liquid Take by mouth every 6 (  six) hours as needed for cough.   Ergocalciferol  (VITAMIN D2) 50 MCG (2000 UT) TABS Take by mouth.   fluconazole (DIFLUCAN) 100 MG tablet Take 100 mg by mouth daily. X 3 days   lidocaine  4 % Place 1 patch onto the skin every 12 (twelve) hours as needed (back pain). Remove & Discard patch within 12 hours or as directed by MD   memantine (NAMENDA) 10 MG tablet Take 10 mg by mouth 2 (two) times daily.   Nutritional Supplements (RESOURCE 2.0) LIQD Take by mouth. 180 ml; oral Twice A Day Special Instructions: administer with med pass   nystatin cream (MYCOSTATIN) Apply 1 Application topically 2 (two) times daily. Under breast for rash for 2 weeks   omeprazole  (PRILOSEC) 40 MG capsule Take 40 mg by mouth daily.   ondansetron  (ZOFRAN -ODT) 4 MG disintegrating tablet Take 4 mg by mouth every 6 (six) hours as needed for nausea or vomiting.   triamcinolone  cream (KENALOG) 0.1 % Apply 1 Application topically 2 (two) times daily. For rash x 2 weeks   vitamin B-12 (CYANOCOBALAMIN ) 1000 MCG tablet Take 1,000 mcg by mouth daily.   No facility-administered encounter medications on file as of 11/10/2023.    Review of Systems  Constitutional:  Negative for fever.  Respiratory:  Negative for cough, shortness of breath and wheezing.   Cardiovascular:  Negative for chest pain, palpitations and leg swelling.  Gastrointestinal:  Negative for abdominal  distention, abdominal pain, blood in stool, constipation, diarrhea, nausea and vomiting.  Genitourinary:  Negative for dysuria.  Neurological:  Negative for dizziness.   Negative unless indicated in HPI.  There were no vitals filed for this visit. There is no height or weight on file to calculate BMI. BP Readings from Last 3 Encounters:  10/21/23 (!) 170/78  07/28/23 (S) (!) 150/80  07/12/23 130/78   Wt Readings from Last 3 Encounters:  10/21/23 192 lb 3.2 oz (87.2 kg)  07/28/23 192 lb (87.1 kg)  07/12/23 192 lb (87.1 kg)   Physical Exam Constitutional:      Appearance: Normal appearance.  HENT:     Head: Normocephalic and atraumatic.  Cardiovascular:     Rate and Rhythm: Normal rate and regular rhythm.  Pulmonary:     Effort: Pulmonary effort is normal. No respiratory distress.     Breath sounds: Normal breath sounds. No wheezing.  Abdominal:     General: Bowel sounds are normal. There is no distension.     Tenderness: There is no abdominal tenderness. There is no guarding or rebound.     Comments:    Musculoskeletal:        General: No swelling.  Neurological:     Mental Status: She is alert. Mental status is at baseline.     Motor: No weakness.     Labs reviewed: Basic Metabolic Panel: Recent Labs    05/10/23 0000 05/24/23 0000  NA 140 139  K 4.2 4.0  CL 104 106  CO2 26* 27*  BUN 21 30*  CREATININE 1.3* 1.5*  CALCIUM  9.5 8.6*   Liver Function Tests: Recent Labs    05/10/23 0000 05/24/23 0000  AST 14 12*  ALT 8 8  ALKPHOS 96 82  ALBUMIN  6.2* 3.4*   No results for input(s): "LIPASE", "AMYLASE" in the last 8760 hours. No results for input(s): "AMMONIA" in the last 8760 hours. CBC: Recent Labs    05/10/23 0000 05/24/23 0000  WBC 8.3 7.8  NEUTROABS 5,013.00 4,703.00  HGB 14.1 12.9  HCT 43 40  PLT 250 209   Cardiac Enzymes: No results for input(s): "CKTOTAL", "CKMB", "CKMBINDEX", "TROPONINI" in the last 8760 hours. BNP: Invalid input(s):  "POCBNP" Lab Results  Component Value Date   HGBA1C 5.8 07/11/2017   Lab Results  Component Value Date   TSH 1.313 09/13/2021   Lab Results  Component Value Date   VITAMINB12 228 04/03/2020   Lab Results  Component Value Date   FOLATE 8.4 04/13/2010   Lab Results  Component Value Date   IRON 23 08/27/2020   TIBC 368 08/27/2020   FERRITIN 6 08/27/2020    Imaging and Procedures obtained prior to SNF admission: DG ERCP Result Date: 09/14/2021 CLINICAL DATA:  Choledocholithiasis EXAM: ERCP TECHNIQUE: Multiple spot images obtained with the fluoroscopic device and submitted for interpretation post-procedure. FLUOROSCOPY: Radiation Exposure Index (as provided by the fluoroscopic device): 67.53 mGy Kerma COMPARISON:  CT abdomen/pelvis 09/12/2021 FINDINGS: Total of 6 intraoperative saved images are submitted for review. The images demonstrate a flexible duodenal scope in the descending duodenum with wire cannulation of the common bile duct. Subsequently, balloon occluded cholangiogram is performed demonstrating biliary ductal dilation. Filling defects in the distal common bile duct suggest choledocholithiasis. Subsequent images document sphincterotomy and balloon sweep of the common duct. IMPRESSION: 1. Choledocholithiasis. 2. ERCP with sphincterotomy and balloon sweeping of the common duct. These images were submitted for radiologic interpretation only. Please see the procedural report for the amount of contrast and the fluoroscopy time utilized. Electronically Signed   By: Fernando Hoyer M.D.   On: 09/14/2021 08:03   CT Abdomen Pelvis Wo Contrast Result Date: 09/12/2021 CLINICAL DATA:  Nausea/vomiting. unwitnessed fall at facility. Was found face down with vomit around her. EXAM: CT ABDOMEN AND PELVIS WITHOUT CONTRAST TECHNIQUE: Multidetector CT imaging of the abdomen and pelvis was performed following the standard protocol without IV contrast. RADIATION DOSE REDUCTION: This exam was  performed according to the departmental dose-optimization program which includes automated exposure control, adjustment of the mA and/or kV according to patient size and/or use of iterative reconstruction technique. COMPARISON:  None. FINDINGS: Slightly limited evaluation due to motion artifact. Lower chest: No acute abnormality.  Tiny hiatal hernia. Liver: Mildly enlarged measuring up to 18 cm.  No focal lesion. Biliary System: Status post cholecystectomy. Enlarged common bile duct measuring up to 1.4 cm with associated intraluminal hyperdensities measuring approximately and 0.70.8 cm. Pancreas: Diffusely atrophic. No focal lesion. Query slight fat stranding along the proximal pancreas (2:31). No main pancreatic ductal dilatation. Spleen: Not enlarged. No focal lesion. Adrenal Glands: No nodularity bilaterally. Kidneys: No hydroureteronephrosis. No nephroureterolithiasis. No contour deforming renal mass. Anterior urinary bladder diverticula. Otherwise the urinary bladder is unremarkable. Bowel: No small or large bowel wall thickening or dilatation. Status post appendectomy. Mesentery, Omentum, and Peritoneum: No simple free fluid ascites. No pneumoperitoneum. No mesenteric hematoma identified. No organized fluid collection. Pelvic Organs: Status post hysterectomy. Bilateral adnexal regions are unremarkable. Lymph Nodes: No abdominal, pelvic, inguinal lymphadenopathy. Vasculature: Severe atherosclerotic plaque. No abdominal aorta or iliac aneurysm. Musculoskeletal: No significant soft tissue hematoma. Small to moderate umbilical hernia containing fat with abdominal wall defect of 3.3 cm. No acute pelvic fracture. No spinal fracture. Multilevel degenerative changes spine. IMPRESSION: 1. Findings suggestive of choledocholithiasis in a patient status post cholecystectomy. Correlate with liver function tests. 2. Query slight fat stranding along the proximal pancreas with limited evaluation due to motion artifact.  Correlate with lipase levels. 3. No acute traumatic injury to the abdomen or pelvis on this  noncontrast study. 4. No acute fracture or traumatic malalignment of the lumbar spine. Other imaging findings of potential clinical significance: 1. Small hiatal hernia. 2. Fat containing umbilical hernia. A findings suggestive ischemia or bowel obstruction. 3. Aortic Atherosclerosis (ICD10-I70.0). 4. Status post cholecystectomy, appendectomy, hysterectomy. Electronically Signed   By: Morgane  Naveau M.D.   On: 09/12/2021 23:37   CT Head Wo Contrast Result Date: 09/12/2021 CLINICAL DATA:  Head trauma, minor (Age >= 65y); Polytrauma, blunt. Unwitnessed fall EXAM: CT HEAD WITHOUT CONTRAST CT CERVICAL SPINE WITHOUT CONTRAST TECHNIQUE: Multidetector CT imaging of the head and cervical spine was performed following the standard protocol without intravenous contrast. Multiplanar CT image reconstructions of the cervical spine were also generated. RADIATION DOSE REDUCTION: This exam was performed according to the departmental dose-optimization program which includes automated exposure control, adjustment of the mA and/or kV according to patient size and/or use of iterative reconstruction technique. COMPARISON:  None. FINDINGS: CT HEAD FINDINGS BRAIN: BRAIN Cerebral ventricle sizes are concordant with the degree of cerebral volume loss. Patchy and confluent areas of decreased attenuation are noted throughout the deep and periventricular white matter of the cerebral hemispheres bilaterally, compatible with chronic microvascular ischemic disease. No evidence of large-territorial acute infarction. No parenchymal hemorrhage. No mass lesion. No extra-axial collection. No mass effect or midline shift. No hydrocephalus. Basilar cisterns are patent. Vascular: No hyperdense vessel. Atherosclerotic calcifications are present within the cavernous internal carotid arteries. Skull: No acute fracture or focal lesion. Sinuses/Orbits: Trace mucosal  thickening of the right maxillary sinus. Otherwise paranasal sinuses and mastoid air cells are clear. Bilateral lens replacement. Otherwise the orbits are unremarkable. Other: None. CT CERVICAL SPINE FINDINGS Alignment: Normal. Skull base and vertebrae: Multilevel moderate degenerative changes spine. Severe osseous neural foraminal stenosis at the C4-C5 level. No acute fracture. No aggressive appearing focal osseous lesion or focal pathologic process. Soft tissues and spinal canal: No prevertebral fluid or swelling. No visible canal hematoma. Upper chest: Unremarkable. Other: None. IMPRESSION: 1. No acute intracranial abnormality. 2. No acute displaced fracture or traumatic listhesis of the cervical spine. Electronically Signed   By: Morgane  Naveau M.D.   On: 09/12/2021 23:25   CT Cervical Spine Wo Contrast Result Date: 09/12/2021 CLINICAL DATA:  Head trauma, minor (Age >= 65y); Polytrauma, blunt. Unwitnessed fall EXAM: CT HEAD WITHOUT CONTRAST CT CERVICAL SPINE WITHOUT CONTRAST TECHNIQUE: Multidetector CT imaging of the head and cervical spine was performed following the standard protocol without intravenous contrast. Multiplanar CT image reconstructions of the cervical spine were also generated. RADIATION DOSE REDUCTION: This exam was performed according to the departmental dose-optimization program which includes automated exposure control, adjustment of the mA and/or kV according to patient size and/or use of iterative reconstruction technique. COMPARISON:  None. FINDINGS: CT HEAD FINDINGS BRAIN: BRAIN Cerebral ventricle sizes are concordant with the degree of cerebral volume loss. Patchy and confluent areas of decreased attenuation are noted throughout the deep and periventricular white matter of the cerebral hemispheres bilaterally, compatible with chronic microvascular ischemic disease. No evidence of large-territorial acute infarction. No parenchymal hemorrhage. No mass lesion. No extra-axial collection.  No mass effect or midline shift. No hydrocephalus. Basilar cisterns are patent. Vascular: No hyperdense vessel. Atherosclerotic calcifications are present within the cavernous internal carotid arteries. Skull: No acute fracture or focal lesion. Sinuses/Orbits: Trace mucosal thickening of the right maxillary sinus. Otherwise paranasal sinuses and mastoid air cells are clear. Bilateral lens replacement. Otherwise the orbits are unremarkable. Other: None. CT CERVICAL SPINE FINDINGS Alignment: Normal. Skull base and  vertebrae: Multilevel moderate degenerative changes spine. Severe osseous neural foraminal stenosis at the C4-C5 level. No acute fracture. No aggressive appearing focal osseous lesion or focal pathologic process. Soft tissues and spinal canal: No prevertebral fluid or swelling. No visible canal hematoma. Upper chest: Unremarkable. Other: None. IMPRESSION: 1. No acute intracranial abnormality. 2. No acute displaced fracture or traumatic listhesis of the cervical spine. Electronically Signed   By: Morgane  Naveau M.D.   On: 09/12/2021 23:25   DG Chest Port 1 View Result Date: 09/12/2021 CLINICAL DATA:  Fever unwitnessed fall EXAM: PORTABLE CHEST 1 VIEW COMPARISON:  05/19/2017 FINDINGS: The heart size and mediastinal contours are within normal limits. Aortic atherosclerosis. Both lungs are clear. The visualized skeletal structures are unremarkable. IMPRESSION: No active disease. Electronically Signed   By: Esmeralda Hedge M.D.   On: 09/12/2021 22:15    Assessment and Plan Assessment & Plan   1. Major neurocognitive disorder (HCC) (Primary) Cont with supportive care Increase cognitively engaging activities and physical activities Cont with namenda Functional Assessment Staging Scale: 6d - Urinary incontinence occasionally or more frequently over the past weeks.   2. Primary hypertension Cont with amlodipine   3. Gastroesophageal reflux disease, unspecified whether esophagitis present Cont with  antacid   4. Generalized OA Take tylenol  prn  5. B12 deficiency Cont with b12 supplemnets  6. Vitamin D  deficiency  Cont with vit d supplements

## 2023-11-11 ENCOUNTER — Encounter: Payer: Self-pay | Admitting: Sports Medicine

## 2023-12-07 ENCOUNTER — Encounter: Payer: Self-pay | Admitting: Orthopedic Surgery

## 2023-12-07 ENCOUNTER — Non-Acute Institutional Stay (SKILLED_NURSING_FACILITY): Payer: Self-pay | Admitting: Orthopedic Surgery

## 2023-12-07 DIAGNOSIS — F0390 Unspecified dementia without behavioral disturbance: Secondary | ICD-10-CM | POA: Diagnosis not present

## 2023-12-07 DIAGNOSIS — M545 Low back pain, unspecified: Secondary | ICD-10-CM | POA: Diagnosis not present

## 2023-12-07 DIAGNOSIS — M25551 Pain in right hip: Secondary | ICD-10-CM

## 2023-12-07 DIAGNOSIS — W19XXXA Unspecified fall, initial encounter: Secondary | ICD-10-CM

## 2023-12-07 NOTE — Progress Notes (Unsigned)
 Location:  [facility] Room Number: N107 A Place of Service:  SNF (31) Provider:  Gil Greig BRAVO, NP   Patient Care Team: Sherlynn Madden, MD as PCP - General (Internal Medicine) Court Dorn PARAS, MD as Consulting Physician (Cardiology)  Extended Emergency Contact Information Primary Emergency Contact: Mathis Mems Address: [address]          [place], [place] [address] [place] Home Phone: [phone] Mobile Phone: [phone] Relation: Niece  Code Status:  DNR Goals of care: Advanced Directive information    07/07/2023    3:08 PM  Advanced Directives  Does Patient Have a Medical Advance Directive? Yes  Type of Estate agent of [place];Living will;Out of facility DNR (pink MOST or yellow form)  Does patient want to make changes to medical advance directive? No - Patient declined  Copy of Healthcare Power of Attorney in Chart? Yes - validated most recent copy scanned in chart (See row information)     Chief Complaint  Patient presents with  . Medical Management of Chronic Issues    Acute visit. Right knee pain post fall    HPI:  Pt is a 89 y.o. female seen today for an acute visit due to right knee pain.   She currently resides on the memory care unit at [facility] PMH: HTN, HLD, PVC's, gastritis, GERD, osteopenia, CKD, anemia, dementia, unstable gait and anemia.   07/01 unwitnessed fall in bathroom. She c/o right knee pain after event. No head injury. Poor historian due to dementia. She is able to stand with mild pain. She c/o right hip and lower back pain. She grabs for lower back while standing. No radiation of pain down right leg. No changes in B&B habits. She also c/o pain with right leg lift. Unable to describe pain or rate it. No swelling or warmth to knee. I was able to bend her knee without difficulty. She normally ambulates with walker. Nursing staff was been assisting her to  bathroom without difficulty. Afebrile. Vitals stable.    Past Medical History:  Diagnosis Date  . Arthritis    right thumb (01/29/2015)  . Chronic shoulder pain    between my shoulders (01/29/2015)  . Dizziness   . Esophageal stricture 2009  . Gallstones   . GERD (gastroesophageal reflux disease) 2004  . Hemorrhoids 2004  . Hiatal hernia 2004  . History of blood transfusion 1947   appendix ruptured  . HOH (hard of hearing)   . Hypertension   . Rhinitis, allergic   . Syncopal episodes    Past Surgical History:  Procedure Laterality Date  . APPENDECTOMY  1947   ruptured  . CARDIAC CATHETERIZATION  10/23/1999; 09/2014   EF 60% No significant CAD; patent coronary arteries.  . CARPAL TUNNEL RELEASE Right 1990's  . CHOLECYSTECTOMY N/A 01/29/2015   Procedure: LAPAROSCOPIC CHOLECYSTECTOMY;  Surgeon: Donnice Bury, MD;  Location: [facility] OR;  Service: General;  Laterality: N/A;  . DILATION AND CURETTAGE OF UTERUS  couple  . ERCP N/A 09/13/2021   Procedure: ENDOSCOPIC RETROGRADE CHOLANGIOPANCREATOGRAPHY (ERCP);  Surgeon: Rollin Dover, MD;  Location: [facility];  Service: Gastroenterology;  Laterality: N/A;  . ESOPHAGOGASTRODUODENOSCOPY (EGD) WITH ESOPHAGEAL DILATION  2-3 times  . HEMORROIDECTOMY    . KNEE ARTHROSCOPY Bilateral    right x 2  . LAPAROSCOPIC CHOLECYSTECTOMY  01/29/2015  . LEFT HEART CATHETERIZATION WITH CORONARY ANGIOGRAM N/A 09/16/2014   Procedure: LEFT HEART CATHETERIZATION WITH CORONARY ANGIOGRAM;  Surgeon: Dorn  JINNY Lesches, MD;  Location: [facility] CATH LAB;  Service: Cardiovascular;  Laterality: N/A;  . NM MYOVIEW  LTD  06/01/2012   EF 71%  . REMOVAL OF STONES  09/13/2021   Procedure: REMOVAL OF STONES;  Surgeon: Rollin Dover, MD;  Location: [facility];  Service: Gastroenterology;;  . ANNETT  09/13/2021   Procedure: ANNETT;  Surgeon: Rollin Dover, MD;  Location: [facility];  Service: Gastroenterology;;  . VAGINAL HYSTERECTOMY  1970's     Allergies  Allergen Reactions  . Contrast Media [Iodinated Contrast Media] Itching and Rash    Pt covered in rash, red.  . Codeine Nausea And Vomiting    in large doses: can tolerate Tussionex    Outpatient Encounter Medications as of 12/07/2023  Medication Sig  . acetaminophen  (TYLENOL ) 325 MG tablet Take 650 mg by mouth every 6 (six) hours as needed for mild pain.  SABRA alum & mag hydroxide-simeth (MAALOX PLUS) 400-400-40 MG/5ML suspension Take 10 mLs by mouth every 6 (six) hours as needed for indigestion or heartburn.  . amLODipine  (NORVASC ) 10 MG tablet Take 10 mg by mouth daily.  . calcium  carbonate (OSCAL) 1500 (600 Ca) MG TABS tablet Take 600 mg of elemental calcium  by mouth daily.  . dextromethorphan -guaiFENesin  (ROBITUSSIN-DM) 10-100 MG/5ML liquid Take by mouth every 6 (six) hours as needed for cough.  . Ergocalciferol  (VITAMIN D2) 50 MCG (2000 UT) TABS Take by mouth.  . lidocaine  4 % Place 1 patch onto the skin every 12 (twelve) hours as needed (back pain). Remove & Discard patch within 12 hours or as directed by MD  . memantine (NAMENDA) 10 MG tablet Take 10 mg by mouth 2 (two) times daily.  . Nutritional Supplements (RESOURCE 2.0) LIQD Take by mouth. 180 ml; oral Twice A Day Special Instructions: administer with med pass  . omeprazole  (PRILOSEC) 40 MG capsule Take 40 mg by mouth daily.  . ondansetron  (ZOFRAN -ODT) 4 MG disintegrating tablet Take 4 mg by mouth every 6 (six) hours as needed for nausea or vomiting.  . vitamin B-12 (CYANOCOBALAMIN ) 1000 MCG tablet Take 1,000 mcg by mouth daily.  . benzonatate (TESSALON) 100 MG capsule Take 1 capsule by mouth 3 (three) times daily. (Patient not taking: Reported on 12/07/2023)  . fluconazole (DIFLUCAN) 100 MG tablet Take 100 mg by mouth daily. X 3 days (Patient not taking: Reported on 12/07/2023)  . nystatin cream (MYCOSTATIN) Apply 1 Application topically 2 (two) times daily. Under breast for rash for 2 weeks (Patient not taking: Reported  on 12/07/2023)  . triamcinolone  cream (KENALOG) 0.1 % Apply 1 Application topically 2 (two) times daily. For rash x 2 weeks (Patient not taking: Reported on 12/07/2023)   No facility-administered encounter medications on file as of 12/07/2023.    Review of Systems  Unable to perform ROS: Dementia    Immunization History  Administered Date(s) Administered  . Fluad Quad(high Dose 65+) 03/29/2022  . Influenza Split 03/16/2011, 03/01/2012  . Influenza Whole 03/28/2007, 04/01/2009, 02/18/2010  . Influenza, High Dose Seasonal PF 03/14/2013  . Influenza,inj,Quad PF,6+ Mos 03/20/2014, 03/11/2015  . Influenza-Unspecified 08/06/2018, 03/20/2020  . Moderna SARS-COV2 Booster Vaccination 11/04/2020  . Moderna Sars-Covid-2 Vaccination 06/11/2019, 07/09/2019, 02/24/2021  . PPD Test 04/23/2020, 11/03/2021  . Pfizer Covid-19 Vaccine Bivalent Booster 67yrs & up 04/08/2022  . Pneumococcal Conjugate-13 02/25/2015  . Pneumococcal Polysaccharide-23 03/28/2007  . Td 12/20/2005   Pertinent  Health Maintenance Due  Topic Date Due  . INFLUENZA VACCINE  01/06/2024  . DEXA SCAN  Completed  .  MAMMOGRAM  Discontinued      09/15/2021    3:00 PM 09/15/2021    8:00 PM 09/16/2021   10:06 AM 07/02/2022    9:42 AM 07/06/2022    1:44 PM  Fall Risk  Falls in the past year?    0 0  Was there an injury with Fall?    0 0  Fall Risk Category Calculator    0 0  (RETIRED) Patient Fall Risk Level High fall risk  High fall risk  High fall risk     Patient at Risk for Falls Due to    No Fall Risks No Fall Risks  Fall risk Follow up    Falls evaluation completed Falls evaluation completed     Data saved with a previous flowsheet row definition   Functional Status Survey:    Vitals:   12/07/23 1458 12/07/23 1459  BP: (!) 159/72 138/74  Pulse: 97   Weight: 189 lb 11.2 oz (86 kg)   Height: 5' (1.524 m)    Body mass index is 37.05 kg/m. Physical Exam Vitals reviewed.  Constitutional:      General: She is not in  acute distress. HENT:     Head: Normocephalic and atraumatic.  Eyes:     General:        Right eye: No discharge.        Left eye: No discharge.  Cardiovascular:     Rate and Rhythm: Normal rate and regular rhythm.     Pulses: Normal pulses.     Heart sounds: Normal heart sounds.  Pulmonary:     Effort: Pulmonary effort is normal. No respiratory distress.     Breath sounds: Normal breath sounds. No wheezing or rales.  Abdominal:     Palpations: Abdomen is soft.  Musculoskeletal:     Cervical back: Normal and neck supple.     Thoracic back: Normal.     Lumbar back: Tenderness present. No swelling or deformity. Decreased range of motion.     Right hip: Tenderness present. No crepitus. Normal strength.     Right knee: No swelling, deformity or crepitus. Normal range of motion. No tenderness.     Left knee: No swelling, deformity or crepitus. Normal range of motion. No tenderness.     Right lower leg: No edema.     Left lower leg: No edema.     Comments: Right lower back/ lateral hip pain on exam, right leg with no external/internal rotation, right dorsiflexion 5/5, right leg lift performed> mild pain  Skin:    General: Skin is warm.     Capillary Refill: Capillary refill takes less than 2 seconds.  Neurological:     General: No focal deficit present.     Mental Status: She is alert. Mental status is at baseline.     Gait: Gait abnormal.  Psychiatric:        Mood and Affect: Mood normal.     Labs reviewed: Recent Labs    05/10/23 0000 05/24/23 0000  NA 140 139  K 4.2 4.0  CL 104 106  CO2 26* 27*  BUN 21 30*  CREATININE 1.3* 1.5*  CALCIUM  9.5 8.6*   Recent Labs    05/10/23 0000 05/24/23 0000  AST 14 12*  ALT 8 8  ALKPHOS 96 82  ALBUMIN  6.2* 3.4*   Recent Labs    05/10/23 0000 05/24/23 0000  WBC 8.3 7.8  NEUTROABS 5,013.00 4,703.00  HGB 14.1 12.9  HCT 43 40  PLT 250 209   Lab Results  Component Value Date   TSH 1.313 09/13/2021   Lab Results   Component Value Date   HGBA1C 5.8 07/11/2017   Lab Results  Component Value Date   CHOL 136 06/18/2021   HDL 34 (A) 06/18/2021   LDLCALC 77 06/18/2021   LDLDIRECT 161.2 07/18/2013   TRIG 151 06/18/2021   CHOLHDL 2.9 04/03/2020    Significant Diagnostic Results in last 30 days:  No results found.  Assessment/Plan 1. Right hip pain (Primary) - 07/01 unwitnessed fall in bathroom - c/o right knee pain after - right lateral hip and lower right back pain on exam - do not suspect hip fracture - xray right hip/pelvis to r/o fracture - start lidocaine  patch 4% x 14 days  2. Acute right-sided low back pain without sciatica - see above - xray lumbar spine  3. Fall, initial encounter - see above - cont falls safety precautions  4. Senile dementia (HCC) - no behaviors - ambulates with ADLs - weight stable - cont Namenda    Family/ staff Communication: plan discussed with patient and nurse  Labs/tests ordered:  xray right hip/pelvis and lumbar spine

## 2023-12-08 ENCOUNTER — Non-Acute Institutional Stay (SKILLED_NURSING_FACILITY): Payer: Self-pay | Admitting: Nurse Practitioner

## 2023-12-08 ENCOUNTER — Encounter: Payer: Self-pay | Admitting: Nurse Practitioner

## 2023-12-08 DIAGNOSIS — R053 Chronic cough: Secondary | ICD-10-CM

## 2023-12-08 DIAGNOSIS — I872 Venous insufficiency (chronic) (peripheral): Secondary | ICD-10-CM | POA: Diagnosis not present

## 2023-12-08 DIAGNOSIS — M150 Primary generalized (osteo)arthritis: Secondary | ICD-10-CM | POA: Diagnosis not present

## 2023-12-08 DIAGNOSIS — M949 Disorder of cartilage, unspecified: Secondary | ICD-10-CM | POA: Diagnosis not present

## 2023-12-08 DIAGNOSIS — E785 Hyperlipidemia, unspecified: Secondary | ICD-10-CM

## 2023-12-08 DIAGNOSIS — M899 Disorder of bone, unspecified: Secondary | ICD-10-CM | POA: Diagnosis not present

## 2023-12-08 DIAGNOSIS — K219 Gastro-esophageal reflux disease without esophagitis: Secondary | ICD-10-CM

## 2023-12-08 DIAGNOSIS — M1711 Unilateral primary osteoarthritis, right knee: Secondary | ICD-10-CM | POA: Diagnosis not present

## 2023-12-08 DIAGNOSIS — F0390 Unspecified dementia without behavioral disturbance: Secondary | ICD-10-CM

## 2023-12-08 DIAGNOSIS — I10 Essential (primary) hypertension: Secondary | ICD-10-CM

## 2023-12-08 DIAGNOSIS — N1831 Chronic kidney disease, stage 3a: Secondary | ICD-10-CM

## 2023-12-08 DIAGNOSIS — D509 Iron deficiency anemia, unspecified: Secondary | ICD-10-CM | POA: Diagnosis not present

## 2023-12-08 DIAGNOSIS — M159 Polyosteoarthritis, unspecified: Secondary | ICD-10-CM | POA: Insufficient documentation

## 2023-12-08 NOTE — Assessment & Plan Note (Signed)
better indigestion, burping, on Omeprazole. prn Maalox 

## 2023-12-08 NOTE — Assessment & Plan Note (Signed)
Blood pressure is controlled,  takes Amlodipine

## 2023-12-08 NOTE — Assessment & Plan Note (Signed)
 takes Amlodipine 

## 2023-12-08 NOTE — Assessment & Plan Note (Signed)
on Benzonatate  ?

## 2023-12-08 NOTE — Assessment & Plan Note (Signed)
 Lower back pain, mild, ambulates with walker, X-ray 02/11/23 thoracic, lumbar spine no compression deformities, mild chronic compression fx lf L1(10%)  R hip/buttock pain, able to bear weight and walking, X-ray 12/07/23 L spine/pelvis, decreased disc space, no acute fx or dislocation. 12/08/23 C/o R knee pain today, no s/s of injury or infection, able to ROM and weight bearing.   X-ray 3 views R knee to r/o fx or dislocation.

## 2023-12-08 NOTE — Assessment & Plan Note (Signed)
 Vit B12 228 04/03/20, added Vit B 216/22, off Fe, Hgb 12.2 10/27/23

## 2023-12-08 NOTE — Progress Notes (Signed)
 Location:  [facility] Room Number: N107 A Place of Service:  SNF (31) Provider:  ManXie Dali Kraner, N.P.  Patient Care Team: Sherlynn Madden, MD as PCP - General (Internal Medicine) Court Dorn PARAS, MD as Consulting Physician (Cardiology)  Extended Emergency Contact Information Primary Emergency Contact: Mathis Mems Address: [address]          [place], [place] [address] [place] Home Phone: [phone] Mobile Phone: [phone] Relation: Niece  Code Status: DNR Goals of care: Advanced Directive information    12/08/2023   10:25 AM  Advanced Directives  Does Patient Have a Medical Advance Directive? Yes  Type of Estate agent of [place];Living will;Out of facility DNR (pink MOST or yellow form)  Does patient want to make changes to medical advance directive? No - Patient declined  Copy of Healthcare Power of Attorney in Chart? Yes - validated most recent copy scanned in chart (See row information)     Chief Complaint  Patient presents with   Medical Management of Chronic Issues    Routine visit     HPI:  Pt is a 89 y.o. female seen today for medical management of chronic diseases.    Senile dementia, MMSE 19/30 02/22/23, tolerated Namenda well, TSH 3.4 04/05/23, functional well in memory care unit              Lower back pain, mild, ambulates with walker, X-ray 02/11/23 thoracic, lumbar spine no compression deformities, mild chronic compression fx lf L1(10%)  R hip/buttock pain, able to bear weight and walking, X-ray 12/07/23 L spine/pelvis, decreased disc space, no acute fx or dislocation. C/o R knee pain today, no s/s of injury or infection, able to ROM and weight bearing.                Weigh stable, off  Mirtazapine 07/02/22 09/12/21 choledocholithiasis/ascending cholangitis s/p ERCP and stone removal. Severe sepsis/E coli bacteremia- fully treated.              Chronic cough, on Benzonatate               CKD, stage 3, stable, Bun/creat 19/1.2 10/27/23             GERD, better indigestion, burping, on Omeprazole . prn Maalox             Edema BLE, off Furosemide, not new,  EF 50-55% 06/01/2017.             HTN, takes Amlodipine              Hyperlipidemia, LDL 77 06/18/21, off Rosuvastatin              Anemia,  Vit B12 228 04/03/20, added Vit B 216/22, off Fe, Hgb 12.2 10/27/23             Osteopenia,  DEXA 07/15/22 t score -2.275, on Ca, Vit D. 03/07/23 offered Alendronate if desires-declined.      Past Medical History:  Diagnosis Date   Arthritis    right thumb (01/29/2015)   Chronic shoulder pain    between my shoulders (01/29/2015)   Dizziness    Esophageal stricture 2009   Gallstones    GERD (gastroesophageal reflux disease) 2004   Hemorrhoids 2004   Hiatal hernia 2004   History of blood transfusion 1947   appendix ruptured   HOH (hard of hearing)    Hypertension    Rhinitis, allergic    Syncopal episodes    Past  Surgical History:  Procedure Laterality Date   APPENDECTOMY  1947   ruptured   CARDIAC CATHETERIZATION  10/23/1999; 09/2014   EF 60% No significant CAD; patent coronary arteries.   CARPAL TUNNEL RELEASE Right 1990's   CHOLECYSTECTOMY N/A 01/29/2015   Procedure: LAPAROSCOPIC CHOLECYSTECTOMY;  Surgeon: Donnice Bury, MD;  Location: [facility] OR;  Service: General;  Laterality: N/A;   DILATION AND CURETTAGE OF UTERUS  couple   ERCP N/A 09/13/2021   Procedure: ENDOSCOPIC RETROGRADE CHOLANGIOPANCREATOGRAPHY (ERCP);  Surgeon: Rollin Dover, MD;  Location: [facility];  Service: Gastroenterology;  Laterality: N/A;   ESOPHAGOGASTRODUODENOSCOPY (EGD) WITH ESOPHAGEAL DILATION  2-3 times   HEMORROIDECTOMY     KNEE ARTHROSCOPY Bilateral    right x 2   LAPAROSCOPIC CHOLECYSTECTOMY  01/29/2015   LEFT HEART CATHETERIZATION WITH CORONARY ANGIOGRAM N/A 09/16/2014   Procedure: LEFT HEART CATHETERIZATION WITH CORONARY ANGIOGRAM;  Surgeon: Dorn JINNY Lesches, MD;  Location: [facility]ATH LAB;   Service: Cardiovascular;  Laterality: N/A;   NM MYOVIEW  LTD  06/01/2012   EF 71%   REMOVAL OF STONES  09/13/2021   Procedure: REMOVAL OF STONES;  Surgeon: Rollin Dover, MD;  Location: [facility];  Service: Gastroenterology;;   ANNETT  09/13/2021   Procedure: ANNETT;  Surgeon: Rollin Dover, MD;  Location: [facility];  Service: Gastroenterology;;   VAGINAL HYSTERECTOMY  1970's    Allergies  Allergen Reactions   Contrast Media [Iodinated Contrast Media] Itching and Rash    Pt covered in rash, red.   Codeine Nausea And Vomiting    in large doses: can tolerate Tussionex    Outpatient Encounter Medications as of 12/08/2023  Medication Sig   acetaminophen  (TYLENOL ) 325 MG tablet Take 650 mg by mouth every 6 (six) hours as needed for mild pain.   alum & mag hydroxide-simeth (MAALOX PLUS) 400-400-40 MG/5ML suspension Take 10 mLs by mouth every 6 (six) hours as needed for indigestion or heartburn.   amLODipine  (NORVASC ) 10 MG tablet Take 10 mg by mouth daily.   calcium  carbonate (OSCAL) 1500 (600 Ca) MG TABS tablet Take 600 mg of elemental calcium  by mouth daily.   dextromethorphan -guaiFENesin  (ROBITUSSIN-DM) 10-100 MG/5ML liquid Take by mouth every 6 (six) hours as needed for cough.   Ergocalciferol  (VITAMIN D2) 50 MCG (2000 UT) TABS Take by mouth.   lactose free nutrition (BOOST) LIQD Take 237 mLs by mouth daily.   lidocaine  4 % Place 1 patch onto the skin every 12 (twelve) hours as needed (back pain). Remove & Discard patch within 12 hours or as directed by MD   memantine (NAMENDA) 10 MG tablet Take 10 mg by mouth 2 (two) times daily.   omeprazole  (PRILOSEC) 40 MG capsule Take 40 mg by mouth daily.   ondansetron  (ZOFRAN -ODT) 4 MG disintegrating tablet Take 4 mg by mouth every 6 (six) hours as needed for nausea or vomiting.   vitamin B-12 (CYANOCOBALAMIN ) 1000 MCG tablet Take 1,000 mcg by mouth daily.   [DISCONTINUED] Nutritional Supplements (RESOURCE 2.0) LIQD Take by mouth.  180 ml; oral Twice A Day Special Instructions: administer with med pass (Patient not taking: Reported on 12/08/2023)   No facility-administered encounter medications on file as of 12/08/2023.    Review of Systems  Constitutional:  Negative for appetite change, fatigue and fever.  HENT:  Positive for hearing loss. Negative for congestion and trouble swallowing.   Eyes:  Negative for visual disturbance.  Respiratory:  Positive for cough. Negative for chest tightness and shortness of breath.  Baseline cough.   Cardiovascular:  Positive for leg swelling.  Gastrointestinal:  Negative for abdominal pain.  Genitourinary:  Positive for frequency. Negative for dysuria and urgency.       Baseline urinary frequency  Musculoskeletal:  Positive for arthralgias, back pain and gait problem.  Skin:  Positive for rash. Negative for color change.  Neurological:  Negative for speech difficulty and weakness.       Memory lapses.   Psychiatric/Behavioral:  Negative for behavioral problems and sleep disturbance. The patient is not nervous/anxious.     Immunization History  Administered Date(s) Administered   Fluad Quad(high Dose 65+) 03/29/2022   Influenza Split 03/16/2011, 03/01/2012   Influenza Whole 03/28/2007, 04/01/2009, 02/18/2010   Influenza, High Dose Seasonal PF 03/14/2013   Influenza,inj,Quad PF,6+ Mos 03/20/2014, 03/11/2015   Influenza-Unspecified 08/06/2018, 03/20/2020   Moderna SARS-COV2 Booster Vaccination 11/04/2020   Moderna Sars-Covid-2 Vaccination 06/11/2019, 07/09/2019, 02/24/2021   PPD Test 04/23/2020, 11/03/2021   Pfizer Covid-19 Vaccine Bivalent Booster 23yrs & up 04/08/2022   Pneumococcal Conjugate-13 02/25/2015   Pneumococcal Polysaccharide-23 03/28/2007   Td 12/20/2005   Td (Adult) 07/12/2023   Zoster Recombinant(Shingrix) 07/13/2022, 10/12/2022   Pertinent  Health Maintenance Due  Topic Date Due   INFLUENZA VACCINE  01/06/2024   DEXA SCAN  Completed   MAMMOGRAM   Discontinued      09/15/2021    3:00 PM 09/15/2021    8:00 PM 09/16/2021   10:06 AM 07/02/2022    9:42 AM 07/06/2022    1:44 PM  Fall Risk  Falls in the past year?    0 0  Was there an injury with Fall?    0 0  Fall Risk Category Calculator    0 0  (RETIRED) Patient Fall Risk Level High fall risk  High fall risk  High fall risk     Patient at Risk for Falls Due to    No Fall Risks No Fall Risks  Fall risk Follow up    Falls evaluation completed Falls evaluation completed     Data saved with a previous flowsheet row definition   Functional Status Survey:    Vitals:   12/08/23 1021 12/12/23 1445  BP: (!) 142/77 122/64  Pulse: 78   Weight: 189 lb 11.2 oz (86 kg)   Height: 5' (1.524 m)    Body mass index is 37.05 kg/m. Physical Exam Constitutional:      Appearance: Normal appearance.  HENT:     Head: Normocephalic and atraumatic.     Mouth/Throat:     Mouth: Mucous membranes are moist.  Eyes:     Extraocular Movements: Extraocular movements intact.     Pupils: Pupils are equal, round, and reactive to light.  Cardiovascular:     Rate and Rhythm: Normal rate and regular rhythm.     Heart sounds: No murmur heard. Pulmonary:     Effort: Pulmonary effort is normal.     Breath sounds: No rales.  Abdominal:     General: Bowel sounds are normal.     Palpations: Abdomen is soft.     Tenderness: There is no abdominal tenderness.  Musculoskeletal:        General: Tenderness present.     Cervical back: Normal range of motion and neck supple.     Right lower leg: Edema present.     Left lower leg: Edema present.     Comments: BLE chronic venous insufficiency skin changes. Trace edema BLE.  Lower back pain about T2/L1 region,  no spinal spinous process tenderness noted.  Pain palpated R hip/buttock C/o R knee pain, able to ROM and weight bearing, no s/s of injury or infection.   Skin:    General: Skin is warm and dry.  Neurological:     General: No focal deficit present.      Mental Status: She is alert. Mental status is at baseline.     Gait: Gait abnormal.     Comments: Oriented to person, her room on unit. Habitual picking up objects when walking around facility.   Psychiatric:        Mood and Affect: Mood normal.        Behavior: Behavior normal.     Labs reviewed: Recent Labs    05/10/23 0000 05/24/23 0000 10/27/23 0000  NA 140 139 136*  K 4.2 4.0 4.3  CL 104 106 101  CO2 26* 27* 28*  BUN 21 30* 19  CREATININE 1.3* 1.5* 1.2*  CALCIUM  9.5 8.6* 9.0   Recent Labs    05/10/23 0000 05/24/23 0000  AST 14 12*  ALT 8 8  ALKPHOS 96 82  ALBUMIN  6.2* 3.4*   Recent Labs    05/10/23 0000 05/24/23 0000 10/27/23 0000  WBC 8.3 7.8 7.6  NEUTROABS 5,013.00 4,703.00  --   HGB 14.1 12.9 12.2  HCT 43 40 38  PLT 250 209 228   Lab Results  Component Value Date   TSH 1.313 09/13/2021   Lab Results  Component Value Date   HGBA1C 5.8 07/11/2017   Lab Results  Component Value Date   CHOL 136 06/18/2021   HDL 34 (A) 06/18/2021   LDLCALC 77 06/18/2021   LDLDIRECT 161.2 07/18/2013   TRIG 151 06/18/2021   CHOLHDL 2.9 04/03/2020    Significant Diagnostic Results in last 30 days:  No results found.  Assessment/Plan CKD (chronic kidney disease) stage 3, GFR 30-59 ml/min (HCC) stable, Bun/creat 19/1.2 10/27/23  GERD better indigestion, burping, on Omeprazole . prn Maalox  Edema of both lower extremities due to peripheral venous insufficiency better indigestion, burping, on Omeprazole . prn Maalox  Hypertension Blood pressure is controlled,  takes Amlodipine   Hyperlipidemia  takes Amlodipine   Anemia, iron deficiency  Vit B12 228 04/03/20, added Vit B 216/22, off Fe, Hgb 12.2 10/27/23  OSTEOPENIA DEXA 07/15/22 t score -2.275, on Ca, Vit D. 03/07/23 offered Alendronate if desires-declined.   COUGH, CHRONIC on Benzonatate   Senile dementia (HCC)   MMSE 19/30 02/22/23, tolerated Namenda well, TSH 3.4 04/05/23,  functional well in memory care  unit   Osteoarthritis, multiple sites Lower back pain, mild, ambulates with walker, X-ray 02/11/23 thoracic, lumbar spine no compression deformities, mild chronic compression fx lf L1(10%)  R hip/buttock pain, able to bear weight and walking, X-ray 12/07/23 L spine/pelvis, decreased disc space, no acute fx or dislocation. 12/08/23 C/o R knee pain today, no s/s of injury or infection, able to ROM and weight bearing.   X-ray 3 views R knee to r/o fx or dislocation.     Family/ staff Communication: plan of care reviewed with the patient and charge nurse  Labs/tests ordered:  X-ray 3 vies R knee.

## 2023-12-08 NOTE — Assessment & Plan Note (Addendum)
 MMSE 19/30 02/22/23, tolerated Namenda well, TSH 3.4 04/05/23,  functional well in memory care unit

## 2023-12-08 NOTE — Assessment & Plan Note (Signed)
 stable, Bun/creat 19/1.2 10/27/23

## 2023-12-08 NOTE — Assessment & Plan Note (Signed)
 DEXA 07/15/22 t score -2.275, on Ca, Vit D 03/07/23 offered Alendronate if desires-declined.

## 2023-12-12 ENCOUNTER — Encounter: Payer: Self-pay | Admitting: Nurse Practitioner

## 2024-01-10 ENCOUNTER — Non-Acute Institutional Stay (SKILLED_NURSING_FACILITY): Payer: Self-pay | Admitting: Nurse Practitioner

## 2024-01-10 ENCOUNTER — Encounter: Payer: Self-pay | Admitting: Nurse Practitioner

## 2024-01-10 ENCOUNTER — Other Ambulatory Visit: Payer: Self-pay

## 2024-01-10 DIAGNOSIS — E785 Hyperlipidemia, unspecified: Secondary | ICD-10-CM

## 2024-01-10 DIAGNOSIS — M949 Disorder of cartilage, unspecified: Secondary | ICD-10-CM

## 2024-01-10 DIAGNOSIS — I10 Essential (primary) hypertension: Secondary | ICD-10-CM

## 2024-01-10 DIAGNOSIS — I872 Venous insufficiency (chronic) (peripheral): Secondary | ICD-10-CM

## 2024-01-10 DIAGNOSIS — D519 Vitamin B12 deficiency anemia, unspecified: Secondary | ICD-10-CM

## 2024-01-10 DIAGNOSIS — R053 Chronic cough: Secondary | ICD-10-CM | POA: Diagnosis not present

## 2024-01-10 DIAGNOSIS — K219 Gastro-esophageal reflux disease without esophagitis: Secondary | ICD-10-CM | POA: Diagnosis not present

## 2024-01-10 DIAGNOSIS — M150 Primary generalized (osteo)arthritis: Secondary | ICD-10-CM

## 2024-01-10 DIAGNOSIS — F0390 Unspecified dementia without behavioral disturbance: Secondary | ICD-10-CM | POA: Diagnosis not present

## 2024-01-10 DIAGNOSIS — N1831 Chronic kidney disease, stage 3a: Secondary | ICD-10-CM | POA: Diagnosis not present

## 2024-01-10 DIAGNOSIS — M899 Disorder of bone, unspecified: Secondary | ICD-10-CM | POA: Diagnosis not present

## 2024-01-10 NOTE — Assessment & Plan Note (Signed)
 MMSE 19/30 02/22/23, tolerated Namenda well, TSH 3.4 04/05/23,  functional well in memory care unit

## 2024-01-10 NOTE — Assessment & Plan Note (Signed)
Blood pressure is controlled,  takes Amlodipine

## 2024-01-10 NOTE — Progress Notes (Unsigned)
 Location:   [facility] Room Number: N107A Place of Service:  SNF (31) Provider: Larwance Katarzyna Wolven NP  Sherlynn Madden, MD  Patient Care Team: Sherlynn Madden, MD as PCP - General (Internal Medicine) Court Dorn PARAS, MD as Consulting Physician (Cardiology)  Extended Emergency Contact Information Primary Emergency Contact: Mathis Mems Address: [address]          [place], [place] [address] [place] Home Phone: [phone] Mobile Phone: [phone] Relation: Niece  Code Status:  DNR Goals of care: Advanced Directive information    12/08/2023   10:25 AM  Advanced Directives  Does Patient Have a Medical Advance Directive? Yes  Type of Estate agent of [place];Living will;Out of facility DNR (pink MOST or yellow form)  Does patient want to make changes to medical advance directive? No - Patient declined  Copy of Healthcare Power of Attorney in Chart? Yes - validated most recent copy scanned in chart (See row information)     Chief Complaint  Patient presents with   Medical Management of Chronic Issues    Routine visit    HPI:  Pt is a 89 y.o. female seen today for medical management of chronic diseases.    Senile dementia, MMSE 19/30 02/22/23, tolerated Namenda well, TSH 3.4 04/05/23, functional well in memory care unit              Lower back pain, mild, ambulates with walker, X-ray 02/11/23 thoracic, lumbar spine no compression deformities, mild chronic compression fx lf L1(10%)             R hip/buttock pain, able to bear weight and walking, X-ray 12/07/23 L spine/pelvis, decreased disc space, no acute fx or dislocation. C/o R knee pain today, no s/s of injury or infection, able to ROM and weight bearing.  Weigh stable, off  Mirtazapine 07/02/22 09/12/21 choledocholithiasis/ascending cholangitis s/p ERCP and stone removal. Severe sepsis/E coli bacteremia- fully treated.              Chronic cough, on Benzonatate               CKD, stage 3, stable, Bun/creat 19/1.2 10/27/23             GERD, better indigestion, burping, on Omeprazole . prn Maalox             Edema BLE, off Furosemide, not new,  EF 50-55% 06/01/2017.             HTN, takes Amlodipine              Hyperlipidemia, LDL 77 06/18/21, off Rosuvastatin              Anemia,  Vit B12 228 04/03/20, added Vit B 216/22, off Fe, Hgb 12.2 10/27/23             Osteopenia,  DEXA 07/15/22 t score -2.275, on Ca, Vit D. 03/07/23 offered Alendronate if desires-declined.     Past Medical History:  Diagnosis Date   Arthritis    right thumb (01/29/2015)   Chronic shoulder pain    between my shoulders (01/29/2015)   Dizziness    Esophageal stricture 2009   Gallstones    GERD (gastroesophageal reflux disease) 2004   Hemorrhoids 2004   Hiatal hernia 2004   History of blood transfusion 1947   appendix ruptured   HOH (hard of hearing)    Hypertension    Rhinitis, allergic    Syncopal episodes    Past Surgical History:  Procedure Laterality Date   APPENDECTOMY  1947   ruptured   CARDIAC CATHETERIZATION  10/23/1999; 09/2014   EF 60% No significant CAD; patent coronary arteries.   CARPAL TUNNEL RELEASE Right 1990's   CHOLECYSTECTOMY N/A 01/29/2015   Procedure: LAPAROSCOPIC CHOLECYSTECTOMY;  Surgeon: Donnice Bury, MD;  Location: [facility] OR;  Service: General;  Laterality: N/A;   DILATION AND CURETTAGE OF UTERUS  couple   ERCP N/A 09/13/2021   Procedure: ENDOSCOPIC RETROGRADE CHOLANGIOPANCREATOGRAPHY (ERCP);  Surgeon: Rollin Dover, MD;  Location: [facility];  Service: Gastroenterology;  Laterality: N/A;   ESOPHAGOGASTRODUODENOSCOPY (EGD) WITH ESOPHAGEAL DILATION  2-3 times   HEMORROIDECTOMY     KNEE ARTHROSCOPY Bilateral    right x 2   LAPAROSCOPIC CHOLECYSTECTOMY  01/29/2015   LEFT HEART CATHETERIZATION WITH CORONARY ANGIOGRAM N/A 09/16/2014   Procedure: LEFT HEART CATHETERIZATION WITH CORONARY ANGIOGRAM;  Surgeon: Dorn JINNY Lesches, MD;  Location: [facility] CATH  LAB;  Service: Cardiovascular;  Laterality: N/A;   NM MYOVIEW  LTD  06/01/2012   EF 71%   REMOVAL OF STONES  09/13/2021   Procedure: REMOVAL OF STONES;  Surgeon: Rollin Dover, MD;  Location: [facility];  Service: Gastroenterology;;   ANNETT  09/13/2021   Procedure: ANNETT;  Surgeon: Rollin Dover, MD;  Location: [facility];  Service: Gastroenterology;;   VAGINAL HYSTERECTOMY  1970's    Allergies  Allergen Reactions   Contrast Media [Iodinated Contrast Media] Itching and Rash    Pt covered in rash, red.   Codeine Nausea And Vomiting    in large doses: can tolerate Tussionex    Allergies as of 01/10/2024       Reactions   Contrast Media [iodinated Contrast Media] Itching, Rash   Pt covered in rash, red.   Codeine Nausea And Vomiting   in large doses: can tolerate Tussionex        Medication List        Accurate as of January 10, 2024 11:59 PM. If you have any questions, ask your nurse or doctor.          acetaminophen  325 MG tablet Commonly known as: TYLENOL  Take 650 mg by mouth every 6 (six) hours as needed for mild pain.   alum & mag hydroxide-simeth 400-400-40 MG/5ML suspension Commonly known as: MAALOX PLUS Take 10 mLs by mouth every 6 (six) hours as needed for indigestion or heartburn.   alum & mag hydroxide-simeth 200-200-20 MG/5ML suspension Commonly known as: MAALOX/MYLANTA Take by mouth every 6 (six) hours as needed for indigestion or heartburn.   amLODipine  10 MG tablet Commonly known as: NORVASC  Take 10 mg by mouth daily.   calcium  carbonate 1500 (600 Ca) MG Tabs tablet Commonly known as: OSCAL Take 600 mg of elemental calcium  by mouth daily.   cyanocobalamin  1000 MCG tablet Commonly known as: VITAMIN B12 Take 1,000 mcg by mouth daily.   dextromethorphan -guaiFENesin  10-100 MG/5ML liquid Commonly known as: ROBITUSSIN-DM Take by mouth every 6 (six) hours as needed for cough.   lactose free nutrition Liqd Take 237 mLs by mouth  daily.   lidocaine  4 % Place 1 patch onto the skin every 12 (twelve) hours as needed (back pain). Remove & Discard patch within 12 hours or as directed by MD   memantine 10 MG tablet Commonly known as: NAMENDA Take 10 mg by mouth 2 (two) times daily.   omeprazole  40 MG capsule Commonly known as: PRILOSEC Take 40 mg by mouth daily.   ondansetron  4 MG disintegrating tablet Commonly known as: ZOFRAN -ODT Take  4 mg by mouth every 6 (six) hours as needed for nausea or vomiting.   Vitamin D2 50 MCG (2000 UT) Tabs Take by mouth.        Review of Systems  Constitutional:  Negative for appetite change, fatigue and fever.  HENT:  Positive for hearing loss. Negative for congestion and trouble swallowing.   Eyes:  Negative for visual disturbance.  Respiratory:  Positive for cough. Negative for chest tightness and shortness of breath.        Baseline cough.   Cardiovascular:  Positive for leg swelling.  Gastrointestinal:  Negative for abdominal pain.  Genitourinary:  Positive for frequency. Negative for dysuria and urgency.       Baseline urinary frequency  Musculoskeletal:  Positive for arthralgias, back pain and gait problem.  Skin:  Positive for rash. Negative for color change.  Neurological:  Negative for speech difficulty and weakness.       Memory lapses.   Psychiatric/Behavioral:  Negative for behavioral problems and sleep disturbance. The patient is not nervous/anxious.     Immunization History  Administered Date(s) Administered   Fluad Quad(high Dose 65+) 03/29/2022   Influenza Split 03/16/2011, 03/01/2012   Influenza Whole 03/28/2007, 04/01/2009, 02/18/2010   Influenza, High Dose Seasonal PF 03/14/2013   Influenza,inj,Quad PF,6+ Mos 03/20/2014, 03/11/2015   Influenza-Unspecified 08/06/2018, 03/20/2020   Moderna SARS-COV2 Booster Vaccination 11/04/2020   Moderna Sars-Covid-2 Vaccination 06/11/2019, 07/09/2019, 02/24/2021   PPD Test 04/23/2020, 11/03/2021   Pfizer  Covid-19 Vaccine Bivalent Booster 52yrs & up 04/08/2022   Pneumococcal Conjugate-13 02/25/2015   Pneumococcal Polysaccharide-23 03/28/2007   Td 12/20/2005   Td (Adult) 07/12/2023   Zoster Recombinant(Shingrix) 07/13/2022, 10/12/2022   Pertinent  Health Maintenance Due  Topic Date Due   INFLUENZA VACCINE  01/06/2024   DEXA SCAN  Completed   MAMMOGRAM  Discontinued      09/15/2021    3:00 PM 09/15/2021    8:00 PM 09/16/2021   10:06 AM 07/02/2022    9:42 AM 07/06/2022    1:44 PM  Fall Risk  Falls in the past year?    0 0  Was there an injury with Fall?    0 0  Fall Risk Category Calculator    0 0  (RETIRED) Patient Fall Risk Level High fall risk  High fall risk  High fall risk     Patient at Risk for Falls Due to    No Fall Risks No Fall Risks  Fall risk Follow up    Falls evaluation completed Falls evaluation completed     Data saved with a previous flowsheet row definition   Functional Status Survey:    Vitals:   01/10/24 1204  BP: 137/62  Pulse: 66  Resp: 18  Temp: 97.9 F (36.6 C)  SpO2: 95%  Weight: 190 lb 6.4 oz (86.4 kg)  Height: 5' (1.524 m)   Body mass index is 37.18 kg/m. Physical Exam Constitutional:      Appearance: Normal appearance.  HENT:     Head: Normocephalic and atraumatic.     Mouth/Throat:     Mouth: Mucous membranes are moist.  Eyes:     Extraocular Movements: Extraocular movements intact.     Pupils: Pupils are equal, round, and reactive to light.  Cardiovascular:     Rate and Rhythm: Normal rate and regular rhythm.     Heart sounds: No murmur heard. Pulmonary:     Effort: Pulmonary effort is normal.     Breath sounds: No rales.  Abdominal:     General: Bowel sounds are normal.     Palpations: Abdomen is soft.     Tenderness: There is no abdominal tenderness.  Musculoskeletal:        General: Tenderness present.     Cervical back: Normal range of motion and neck supple.     Right lower leg: Edema present.     Left lower leg: Edema  present.     Comments: BLE chronic venous insufficiency skin changes. Trace edema BLE.  Lower back pain about T2/L1 region, no spinal spinous process tenderness noted.  Pain palpated R hip/buttock C/o R knee pain, able to ROM and weight bearing, no s/s of injury or infection.   Skin:    General: Skin is warm and dry.  Neurological:     General: No focal deficit present.     Mental Status: She is alert. Mental status is at baseline.     Gait: Gait abnormal.     Comments: Oriented to person, her room on unit. Habitual picking up objects when walking around facility.   Psychiatric:        Mood and Affect: Mood normal.        Behavior: Behavior normal.     Labs reviewed: Recent Labs    05/10/23 0000 05/24/23 0000 10/27/23 0000  NA 140 139 136*  K 4.2 4.0 4.3  CL 104 106 101  CO2 26* 27* 28*  BUN 21 30* 19  CREATININE 1.3* 1.5* 1.2*  CALCIUM  9.5 8.6* 9.0   Recent Labs    05/10/23 0000 05/24/23 0000  AST 14 12*  ALT 8 8  ALKPHOS 96 82  ALBUMIN  6.2* 3.4*   Recent Labs    05/10/23 0000 05/24/23 0000 10/27/23 0000  WBC 8.3 7.8 7.6  NEUTROABS 5,013.00 4,703.00  --   HGB 14.1 12.9 12.2  HCT 43 40 38  PLT 250 209 228   Lab Results  Component Value Date   TSH 1.313 09/13/2021   Lab Results  Component Value Date   HGBA1C 5.8 07/11/2017   Lab Results  Component Value Date   CHOL 136 06/18/2021   HDL 34 (A) 06/18/2021   LDLCALC 77 06/18/2021   LDLDIRECT 161.2 07/18/2013   TRIG 151 06/18/2021   CHOLHDL 2.9 04/03/2020    Significant Diagnostic Results in last 30 days:  No results found.  Assessment/Plan  Hypertension Blood pressure is controlled,  takes Amlodipine   Hyperlipidemia LDL 77 06/18/21, off Rosuvastatin   Vitamin B12 deficiency anemia  Vit B12 228 04/03/20, added Vit B 216/22, off Fe, Hgb 12.2 10/27/23  OSTEOPENIA DEXA 07/15/22 t score -2.275, on Ca, Vit D. 03/07/23 offered Alendronate if desires-declined.     Edema of both lower extremities  due to peripheral venous insufficiency Mild, off Furosemide, not new,  EF 50-55% 06/01/2017.  GERD better indigestion, burping, on Omeprazole . prn Maalox  CKD (chronic kidney disease) stage 3, GFR 30-59 ml/min (HCC) stable, Bun/creat 19/1.2 10/27/23  COUGH, CHRONIC Chronic cough, on Benzonatate   Osteoarthritis, multiple sites Ambulates with walker slowly, Tylenol  is adequate  Major neurocognitive disorder (HCC)  MMSE 19/30 02/22/23, tolerated Namenda well, TSH 3.4 04/05/23, functional well in memory care unit    Family/ staff Communication: Plan of care reviewed with the patient and charge nurse  Labs/tests ordered: None

## 2024-01-10 NOTE — Assessment & Plan Note (Signed)
Mild, off Furosemide, not new,  EF 50-55% 06/01/2017.

## 2024-01-10 NOTE — Assessment & Plan Note (Signed)
 Vit B12 228 04/03/20, added Vit B 216/22, off Fe, Hgb 12.2 10/27/23

## 2024-01-10 NOTE — Assessment & Plan Note (Signed)
 DEXA 07/15/22 t score -2.275, on Ca, Vit D 03/07/23 offered Alendronate if desires-declined.

## 2024-01-10 NOTE — Assessment & Plan Note (Signed)
better indigestion, burping, on Omeprazole. prn Maalox 

## 2024-01-10 NOTE — Assessment & Plan Note (Signed)
LDL 77 06/18/21, off Rosuvastatin 

## 2024-01-10 NOTE — Assessment & Plan Note (Signed)
 stable, Bun/creat 19/1.2 10/27/23

## 2024-01-10 NOTE — Assessment & Plan Note (Signed)
 Ambulates with walker slowly, Tylenol  is adequate

## 2024-01-10 NOTE — Assessment & Plan Note (Signed)
 Chronic cough, on Benzonatate

## 2024-01-12 ENCOUNTER — Encounter: Payer: Self-pay | Admitting: Nurse Practitioner

## 2024-02-23 ENCOUNTER — Non-Acute Institutional Stay (SKILLED_NURSING_FACILITY): Payer: Self-pay | Admitting: Sports Medicine

## 2024-02-23 DIAGNOSIS — K219 Gastro-esophageal reflux disease without esophagitis: Secondary | ICD-10-CM

## 2024-02-23 DIAGNOSIS — G8929 Other chronic pain: Secondary | ICD-10-CM

## 2024-02-23 DIAGNOSIS — M545 Low back pain, unspecified: Secondary | ICD-10-CM

## 2024-02-23 DIAGNOSIS — I10 Essential (primary) hypertension: Secondary | ICD-10-CM | POA: Diagnosis not present

## 2024-02-23 DIAGNOSIS — F0390 Unspecified dementia without behavioral disturbance: Secondary | ICD-10-CM | POA: Diagnosis not present

## 2024-02-23 DIAGNOSIS — N1831 Chronic kidney disease, stage 3a: Secondary | ICD-10-CM

## 2024-02-23 NOTE — Progress Notes (Unsigned)
 Provider:  Dr. Jackalyn Blazing Location:  [facility] Place of Service:   Memory care   PCP: Blazing Jackalyn, MD Patient Care Team: Blazing Jackalyn, MD as PCP - General (Internal Medicine) Court Dorn PARAS, MD as Consulting Physician (Cardiology)  Extended Emergency Contact Information Primary Emergency Contact: Mathis Mems Address: [address]          [place], [place] [address] [place] Home Phone: [phone] Mobile Phone: [phone] Relation: Niece  Goals of Care: Advanced Directive information    12/08/2023   10:25 AM  Advanced Directives  Does Patient Have a Medical Advance Directive? Yes  Type of Estate agent of [place];Living will;Out of facility DNR (pink MOST or yellow form)  Does patient want to make changes to medical advance directive? No - Patient declined  Copy of Healthcare Power of Attorney in Chart? Yes - validated most recent copy scanned in chart (See row information)      No chief complaint on file.     History of Present Illness  89 year old female with a past medical history of dementia, chronic low back pain, CKD, GERD, hypertension, B12 deficiency, osteopenia is seen today for chronic disease management. Patient seen and examined in her room.  She is sitting in the recliner chair watching TV.  She seems pleasant and comfortable and does not appear to be in distress. Patient knows her name, cannot tell me what she had for breakfast this morning.  She is not oriented to time. Patient denies headache, nausea, vomiting, chest pain, cough, congestion, shortness of breath, abdominal pain, nausea, vomiting, dysuria, hematuria. As per nursing staff no behavioral manifestations. Patient ambulates with a walker.  02/13/2024 15:41 194.5 Lbs (Standing) 02/06/2024 13:38 194.5 Lbs (Standing) 01/06/2024 11:04 190.4 Lbs (Standing) 12/06/2023 12:59 189.7 Lbs (Standing) 11/30/2023 08:06  190 Lbs (Standing) 11/23/2023 12:06 191.1 Lbs (Standing) 11/16/2023 12:45 192.1 Lbs (Standing)      Past Medical History:  Diagnosis Date   Arthritis    right thumb (01/29/2015)   Chronic shoulder pain    between my shoulders (01/29/2015)   Dizziness    Esophageal stricture 2009   Gallstones    GERD (gastroesophageal reflux disease) 2004   Hemorrhoids 2004   Hiatal hernia 2004   History of blood transfusion 1947   appendix ruptured   HOH (hard of hearing)    Hypertension    Rhinitis, allergic    Syncopal episodes    Past Surgical History:  Procedure Laterality Date   APPENDECTOMY  1947   ruptured   CARDIAC CATHETERIZATION  10/23/1999; 09/2014   EF 60% No significant CAD; patent coronary arteries.   CARPAL TUNNEL RELEASE Right 1990's   CHOLECYSTECTOMY N/A 01/29/2015   Procedure: LAPAROSCOPIC CHOLECYSTECTOMY;  Surgeon: Donnice Bury, MD;  Location: [facility] OR;  Service: General;  Laterality: N/A;   DILATION AND CURETTAGE OF UTERUS  couple   ERCP N/A 09/13/2021   Procedure: ENDOSCOPIC RETROGRADE CHOLANGIOPANCREATOGRAPHY (ERCP);  Surgeon: Rollin Dover, MD;  Location: [facility];  Service: Gastroenterology;  Laterality: N/A;   ESOPHAGOGASTRODUODENOSCOPY (EGD) WITH ESOPHAGEAL DILATION  2-3 times   HEMORROIDECTOMY     KNEE ARTHROSCOPY Bilateral    right x 2   LAPAROSCOPIC CHOLECYSTECTOMY  01/29/2015   LEFT HEART CATHETERIZATION WITH CORONARY ANGIOGRAM N/A 09/16/2014   Procedure: LEFT HEART CATHETERIZATION WITH CORONARY ANGIOGRAM;  Surgeon: Dorn PARAS Court, MD;  Location: [facility] CATH LAB;  Service: Cardiovascular;  Laterality: N/A;   NM MYOVIEW  LTD  06/01/2012   EF  71%   REMOVAL OF STONES  09/13/2021   Procedure: REMOVAL OF STONES;  Surgeon: Rollin Dover, MD;  Location: [facility];  Service: Gastroenterology;;   ANNETT  09/13/2021   Procedure: ANNETT;  Surgeon: Rollin Dover, MD;  Location: [facility];  Service: Gastroenterology;;   VAGINAL HYSTERECTOMY   1970's    reports that she has never smoked. She has never used smokeless tobacco. She reports that she does not drink alcohol  and does not use drugs. 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   She does drink/eat things with caffeine in it.   Marital status: widow (married in [place])   Patient lives in an assisted living 1 story apartment home   Highest level of education completed was high school.   Past profession: home maker   Exercise: walk 3 x week   Patient has a POA and living   Social Drivers of Corporate investment banker Strain: Not on file  Food Insecurity: Not on file  Transportation Needs: Not on file  Physical Activity: Not on file  Stress: Not on file  Social Connections: Not on file  Intimate Partner Violence: Not on file    Functional Status Survey:    Family History  Problem Relation Age of Onset   Stroke Father 15       light stroke   Heart disease Sister    COPD Sister    Heart disease Brother    Coronary artery disease Neg Hx    Diabetes Neg Hx    Cancer Neg Hx        breast, colon, prostate    Health Maintenance  Topic Date Due   Influenza Vaccine  01/06/2024   COVID-19 Vaccine (6 - 2025-26 season) 02/06/2024   Medicare Annual Wellness (AWV)  07/06/2024   DTaP/Tdap/Td (3 - Tdap) 07/11/2033   Pneumococcal Vaccine: 50+ Years   Completed   DEXA SCAN  Completed   Zoster Vaccines- Shingrix  Completed   HPV VACCINES  Aged Out   Meningococcal B Vaccine  Aged Out   Mammogram  Discontinued    Allergies  Allergen Reactions   Contrast Media [Iodinated Contrast Media] Itching and Rash    Pt covered in rash, red.   Codeine Nausea And Vomiting    in large doses: can tolerate Tussionex    Outpatient Encounter Medications as of 02/23/2024  Medication Sig   acetaminophen  (TYLENOL ) 325 MG tablet Take 650 mg by mouth every 6 (six) hours as needed for mild pain.   alum & mag hydroxide-simeth (MAALOX PLUS) 400-400-40 MG/5ML suspension Take 10 mLs by mouth every 6 (six) hours as needed for indigestion or heartburn. (Patient not taking: Reported on 01/10/2024)   alum & mag hydroxide-simeth (MAALOX/MYLANTA) 200-200-20 MG/5ML suspension  Take by mouth every 6 (six) hours as needed for indigestion or heartburn.   amLODipine  (NORVASC ) 10 MG tablet Take 10 mg by mouth daily.   calcium  carbonate (OSCAL) 1500 (600 Ca) MG TABS tablet Take 600 mg of elemental calcium  by mouth daily.   dextromethorphan -guaiFENesin  (ROBITUSSIN-DM) 10-100 MG/5ML liquid Take by mouth every 6 (six) hours as needed for cough.   Ergocalciferol  (VITAMIN D2) 50 MCG (2000 UT) TABS Take by mouth.   lactose free nutrition (BOOST) LIQD Take 237 mLs by mouth daily. (Patient not taking: Reported on 01/10/2024)   lidocaine  4 % Place 1 patch onto the skin every 12 (twelve) hours as needed (back pain). Remove & Discard patch within 12 hours or as directed by MD   memantine (NAMENDA) 10 MG tablet Take 10 mg by mouth 2 (two) times daily.   omeprazole  (PRILOSEC) 40 MG capsule Take 40 mg by mouth daily.   ondansetron  (ZOFRAN -ODT) 4 MG disintegrating tablet Take 4 mg by mouth every 6 (six) hours as needed for nausea or vomiting.   vitamin B-12 (CYANOCOBALAMIN ) 1000 MCG tablet Take 1,000 mcg by mouth daily.   No facility-administered encounter medications on file as of 02/23/2024.     Review of Systems  Constitutional:  Negative for chills and fever.  HENT:  Negative for sore throat.   Respiratory:  Negative for cough, shortness of breath and wheezing.   Cardiovascular:  Negative for chest pain, palpitations and leg swelling.  Gastrointestinal:  Negative for abdominal pain, blood in stool, constipation, diarrhea, nausea and vomiting.  Genitourinary:  Negative for dysuria, frequency and urgency.  Neurological:  Negative for dizziness.   Negative unless indicated in HPI.  There were no vitals filed for this visit. There is no height or weight on file to calculate BMI. BP Readings from Last 3 Encounters:  01/10/24 137/62  12/12/23 122/64  12/07/23 138/74   Wt Readings from Last 3 Encounters:  01/10/24 190 lb 6.4 oz (86.4 kg)  12/08/23 189 lb 11.2 oz (86 kg)  12/07/23 189 lb 11.2 oz (86 kg)   Physical Exam Constitutional:      Appearance: Normal appearance.  HENT:     Head: Normocephalic and atraumatic.  Cardiovascular:     Rate and Rhythm: Normal rate and regular rhythm.  Pulmonary:     Effort: Pulmonary effort is normal. No respiratory distress.     Breath sounds: Normal breath sounds. No wheezing.  Abdominal:     General: Bowel sounds are normal. There is no distension.     Tenderness: There is no abdominal tenderness. There is no guarding or rebound.     Comments:    Musculoskeletal:        General: No swelling.  Neurological:     Mental Status: She is alert. Mental status is at baseline.     Motor: No weakness.     Labs reviewed: Basic Metabolic Panel: Recent Labs    05/10/23 0000 05/24/23 0000 10/27/23 0000  NA 140 139 136*  K 4.2 4.0 4.3  CL 104 106 101  CO2 26* 27* 28*  BUN 21 30* 19  CREATININE 1.3* 1.5* 1.2*  CALCIUM  9.5 8.6* 9.0   Liver Function Tests: Recent Labs    05/10/23 0000 05/24/23 0000  AST 14 12*  ALT 8 8  ALKPHOS 96 82  ALBUMIN  6.2* 3.4*   No results for input(s): LIPASE, AMYLASE in the last 8760  hours. No results for input(s): AMMONIA in the last 8760 hours. CBC: Recent  Labs    05/10/23 0000 05/24/23 0000 10/27/23 0000  WBC 8.3 7.8 7.6  NEUTROABS 5,013.00 4,703.00  --   HGB 14.1 12.9 12.2  HCT 43 40 38  PLT 250 209 228   Cardiac Enzymes: No results for input(s): CKTOTAL, CKMB, CKMBINDEX, TROPONINI in the last 8760 hours. BNP: Invalid input(s): POCBNP Lab Results  Component Value Date   HGBA1C 5.8 07/11/2017   Lab Results  Component Value Date   TSH 1.313 09/13/2021   Lab Results  Component Value Date   VITAMINB12 228 04/03/2020   Lab Results  Component Value Date   FOLATE 8.4 04/13/2010   Lab Results  Component Value Date   IRON 23 08/27/2020   TIBC 368 08/27/2020   FERRITIN 6 08/27/2020    Imaging and Procedures obtained prior to SNF admission: DG ERCP Result Date: 09/14/2021 CLINICAL DATA:  Choledocholithiasis EXAM: ERCP TECHNIQUE: Multiple spot images obtained with the fluoroscopic device and submitted for interpretation post-procedure. FLUOROSCOPY: Radiation Exposure Index (as provided by the fluoroscopic device): 67.53 mGy Kerma COMPARISON:  CT abdomen/pelvis 09/12/2021 FINDINGS: Total of 6 intraoperative saved images are submitted for review. The images demonstrate a flexible duodenal scope in the descending duodenum with wire cannulation of the common bile duct. Subsequently, balloon occluded cholangiogram is performed demonstrating biliary ductal dilation. Filling defects in the distal common bile duct suggest choledocholithiasis. Subsequent images document sphincterotomy and balloon sweep of the common duct. IMPRESSION: 1. Choledocholithiasis. 2. ERCP with sphincterotomy and balloon sweeping of the common duct. These images were submitted for radiologic interpretation only. Please see the procedural report for the amount of contrast and the fluoroscopy time utilized. Electronically Signed   By: Wilkie Lent M.D.   On: 09/14/2021 08:03    CT Abdomen Pelvis Wo Contrast Result Date: 09/12/2021 CLINICAL DATA:  Nausea/vomiting. unwitnessed fall at facility. Was found face down with vomit around her. EXAM: CT ABDOMEN AND PELVIS WITHOUT CONTRAST TECHNIQUE: Multidetector CT imaging of the abdomen and pelvis was performed following the standard protocol without IV contrast. RADIATION DOSE REDUCTION: This exam was performed according to the departmental dose-optimization program which includes automated exposure control, adjustment of the mA and/or kV according to patient size and/or use of iterative reconstruction technique. COMPARISON:  None. FINDINGS: Slightly limited evaluation due to motion artifact. Lower chest: No acute abnormality.  Tiny hiatal hernia. Liver: Mildly enlarged measuring up to 18 cm.  No focal lesion. Biliary System: Status post cholecystectomy. Enlarged common bile duct measuring up to 1.4 cm with associated intraluminal hyperdensities measuring approximately and 0.70.8 cm. Pancreas: Diffusely atrophic. No focal lesion. Query slight fat stranding along the proximal pancreas (2:31). No main pancreatic ductal dilatation. Spleen: Not enlarged. No focal lesion. Adrenal Glands: No nodularity bilaterally. Kidneys: No hydroureteronephrosis. No nephroureterolithiasis. No contour deforming renal mass. Anterior urinary bladder diverticula. Otherwise the urinary bladder is unremarkable. Bowel: No small or large bowel wall thickening or dilatation. Status post appendectomy. Mesentery, Omentum, and Peritoneum: No simple free fluid ascites. No pneumoperitoneum. No mesenteric hematoma identified. No organized fluid collection. Pelvic Organs: Status post hysterectomy. Bilateral adnexal regions are unremarkable. Lymph Nodes: No abdominal, pelvic, inguinal lymphadenopathy. Vasculature: Severe atherosclerotic plaque. No abdominal aorta or iliac aneurysm. Musculoskeletal: No significant soft tissue hematoma. Small to moderate umbilical hernia  containing fat with abdominal wall defect of 3.3 cm. No acute pelvic fracture. No spinal fracture. Multilevel degenerative changes spine. IMPRESSION: 1. Findings suggestive of choledocholithiasis in a patient status post cholecystectomy. Correlate with liver function tests. 2. Query slight  fat stranding along the proximal pancreas with limited evaluation due to motion artifact. Correlate with lipase levels. 3. No acute traumatic injury to the abdomen or pelvis on this noncontrast study. 4. No acute fracture or traumatic malalignment of the lumbar spine. Other imaging findings of potential clinical significance: 1. Small hiatal hernia. 2. Fat containing umbilical hernia. A findings suggestive ischemia or bowel obstruction. 3. Aortic Atherosclerosis (ICD10-I70.0). 4. Status post cholecystectomy, appendectomy, hysterectomy. Electronically Signed   By: Morgane  Naveau M.D.   On: 09/12/2021 23:37   CT Head Wo Contrast Result Date: 09/12/2021 CLINICAL DATA:  Head trauma, minor (Age >= 65y); Polytrauma, blunt. Unwitnessed fall EXAM: CT HEAD WITHOUT CONTRAST CT CERVICAL SPINE WITHOUT CONTRAST TECHNIQUE: Multidetector CT imaging of the head and cervical spine was performed following the standard protocol without intravenous contrast. Multiplanar CT image reconstructions of the cervical spine were also generated. RADIATION DOSE REDUCTION: This exam was performed according to the departmental dose-optimization program which includes automated exposure control, adjustment of the mA and/or kV according to patient size and/or use of iterative reconstruction technique. COMPARISON:  None. FINDINGS: CT HEAD FINDINGS BRAIN: BRAIN Cerebral ventricle sizes are concordant with the degree of cerebral volume loss. Patchy and confluent areas of decreased attenuation are noted throughout the deep and periventricular white matter of the cerebral hemispheres bilaterally, compatible with chronic microvascular ischemic disease. No evidence of  large-territorial acute infarction. No parenchymal hemorrhage. No mass lesion. No extra-axial collection. No mass effect or midline shift. No hydrocephalus. Basilar cisterns are patent. Vascular: No hyperdense vessel. Atherosclerotic calcifications are present within the cavernous internal carotid arteries. Skull: No acute fracture or focal lesion. Sinuses/Orbits: Trace mucosal thickening of the right maxillary sinus. Otherwise paranasal sinuses and mastoid air cells are clear. Bilateral lens replacement. Otherwise the orbits are unremarkable. Other: None. CT CERVICAL SPINE FINDINGS Alignment: Normal. Skull base and vertebrae: Multilevel moderate degenerative changes spine. Severe osseous neural foraminal stenosis at the C4-C5 level. No acute fracture. No aggressive appearing focal osseous lesion or focal pathologic process. Soft tissues and spinal canal: No prevertebral fluid or swelling. No visible canal hematoma. Upper chest: Unremarkable. Other: None. IMPRESSION: 1. No acute intracranial abnormality. 2. No acute displaced fracture or traumatic listhesis of the cervical spine. Electronically Signed   By: Morgane  Naveau M.D.   On: 09/12/2021 23:25   CT Cervical Spine Wo Contrast Result Date: 09/12/2021 CLINICAL DATA:  Head trauma, minor (Age >= 65y); Polytrauma, blunt. Unwitnessed fall EXAM: CT HEAD WITHOUT CONTRAST CT CERVICAL SPINE WITHOUT CONTRAST TECHNIQUE: Multidetector CT imaging of the head and cervical spine was performed following the standard protocol without intravenous contrast. Multiplanar CT image reconstructions of the cervical spine were also generated. RADIATION DOSE REDUCTION: This exam was performed according to the departmental dose-optimization program which includes automated exposure control, adjustment of the mA and/or kV according to patient size and/or use of iterative reconstruction technique. COMPARISON:  None. FINDINGS: CT HEAD FINDINGS BRAIN: BRAIN Cerebral ventricle sizes are  concordant with the degree of cerebral volume loss. Patchy and confluent areas of decreased attenuation are noted throughout the deep and periventricular white matter of the cerebral hemispheres bilaterally, compatible with chronic microvascular ischemic disease. No evidence of large-territorial acute infarction. No parenchymal hemorrhage. No mass lesion. No extra-axial collection. No mass effect or midline shift. No hydrocephalus. Basilar cisterns are patent. Vascular: No hyperdense vessel. Atherosclerotic calcifications are present within the cavernous internal carotid arteries. Skull: No acute fracture or focal lesion. Sinuses/Orbits: Trace mucosal thickening of the right maxillary  sinus. Otherwise paranasal sinuses and mastoid air cells are clear. Bilateral lens replacement. Otherwise the orbits are unremarkable. Other: None. CT CERVICAL SPINE FINDINGS Alignment: Normal. Skull base and vertebrae: Multilevel moderate degenerative changes spine. Severe osseous neural foraminal stenosis at the C4-C5 level. No acute fracture. No aggressive appearing focal osseous lesion or focal pathologic process. Soft tissues and spinal canal: No prevertebral fluid or swelling. No visible canal hematoma. Upper chest: Unremarkable. Other: None. IMPRESSION: 1. No acute intracranial abnormality. 2. No acute displaced fracture or traumatic listhesis of the cervical spine. Electronically Signed   By: Morgane  Naveau M.D.   On: 09/12/2021 23:25   DG Chest Port 1 View Result Date: 09/12/2021 CLINICAL DATA:  Fever unwitnessed fall EXAM: PORTABLE CHEST 1 VIEW COMPARISON:  05/19/2017 FINDINGS: The heart size and mediastinal contours are within normal limits. Aortic atherosclerosis. Both lungs are clear. The visualized skeletal structures are unremarkable. IMPRESSION: No active disease. Electronically Signed   By: Luke Bun M.D.   On: 09/12/2021 22:15    Assessment and Plan Assessment & Plan  Major neurocognitive disorder No  behavioral manifestations Continue with Namenda Continue assistance with ADLs Increase cognitively engaging activities and physical activity.  Hypertension Blood pressure at goal Continue with amlodipine   CKD Creatinine 1.17 Avoid nephrotoxic medications Increase oral hydration  GERD Stable Denies bloody or dark-colored stools Continue with omeprazole   Chronic low back pain Stable Denies pain during the visit today Continue with Tylenol  as needed, lidocaine  patches

## 2024-02-24 ENCOUNTER — Encounter: Payer: Self-pay | Admitting: Sports Medicine

## 2024-03-07 DIAGNOSIS — M6281 Muscle weakness (generalized): Secondary | ICD-10-CM | POA: Diagnosis not present

## 2024-03-09 ENCOUNTER — Non-Acute Institutional Stay (SKILLED_NURSING_FACILITY): Payer: Self-pay | Admitting: Nurse Practitioner

## 2024-03-09 ENCOUNTER — Encounter: Payer: Self-pay | Admitting: Nurse Practitioner

## 2024-03-09 DIAGNOSIS — D519 Vitamin B12 deficiency anemia, unspecified: Secondary | ICD-10-CM

## 2024-03-09 DIAGNOSIS — E785 Hyperlipidemia, unspecified: Secondary | ICD-10-CM | POA: Diagnosis not present

## 2024-03-09 DIAGNOSIS — M899 Disorder of bone, unspecified: Secondary | ICD-10-CM | POA: Diagnosis not present

## 2024-03-09 DIAGNOSIS — F0390 Unspecified dementia without behavioral disturbance: Secondary | ICD-10-CM

## 2024-03-09 DIAGNOSIS — M949 Disorder of cartilage, unspecified: Secondary | ICD-10-CM

## 2024-03-09 DIAGNOSIS — R053 Chronic cough: Secondary | ICD-10-CM | POA: Diagnosis not present

## 2024-03-09 DIAGNOSIS — I10 Essential (primary) hypertension: Secondary | ICD-10-CM | POA: Diagnosis not present

## 2024-03-09 DIAGNOSIS — K219 Gastro-esophageal reflux disease without esophagitis: Secondary | ICD-10-CM | POA: Diagnosis not present

## 2024-03-09 DIAGNOSIS — N1831 Chronic kidney disease, stage 3a: Secondary | ICD-10-CM

## 2024-03-09 NOTE — Assessment & Plan Note (Signed)
LDL 77 06/18/21, off Rosuvastatin 

## 2024-03-09 NOTE — Assessment & Plan Note (Signed)
 DEXA 07/15/22 t score -2.275, on Ca, Vit D 03/07/23 offered Alendronate if desires-declined.

## 2024-03-09 NOTE — Assessment & Plan Note (Signed)
better indigestion, burping, on Omeprazole. prn Maalox 

## 2024-03-09 NOTE — Assessment & Plan Note (Signed)
 MMSE 19/30 02/22/23, tolerated Namenda well, TSH 3.4 04/05/23,  functional well in memory care unit

## 2024-03-09 NOTE — Assessment & Plan Note (Signed)
 Vit B12 228 04/03/20, added Vit B 216/22, off Fe, Hgb 12.2 10/27/23

## 2024-03-09 NOTE — Assessment & Plan Note (Signed)
on Benzonatate  ?

## 2024-03-09 NOTE — Assessment & Plan Note (Signed)
 Blood pressure is controlled, continue amlodipine .

## 2024-03-09 NOTE — Assessment & Plan Note (Signed)
 stable, Bun/creat 19/1.2 10/27/23

## 2024-03-09 NOTE — Progress Notes (Signed)
 Location:   [facility] Room Number: 107 Place of Service:  SNF (31) Provider: Larwance Fatou Dunnigan NP  Sherlynn Madden, MD  Patient Care Team: Sherlynn Madden, MD as PCP - General (Internal Medicine) Court Dorn PARAS, MD as Consulting Physician (Cardiology)  Extended Emergency Contact Information Primary Emergency Contact: Mathis Mems Address: [address]          [place], [place] [address] [place] Home Phone: [phone] Mobile Phone: [phone] Relation: Niece  Code Status:  DNR Goals of care: Advanced Directive information    12/08/2023   10:25 AM  Advanced Directives  Does Patient Have a Medical Advance Directive? Yes  Type of Estate agent of [place];Living will;Out of facility DNR (pink MOST or yellow form)  Does patient want to make changes to medical advance directive? No - Patient declined  Copy of Healthcare Power of Attorney in Chart? Yes - validated most recent copy scanned in chart (See row information)     Chief Complaint  Patient presents with   Medical Management of Chronic Issues    HPI:  Pt is a 89 y.o. [place] seen today for medical management of chronic diseases.       Senile dementia, MMSE 19/30 02/22/23, tolerated Namenda well, TSH 3.4 04/05/23, functional well in memory care unit              Lower back pain, mild, ambulates with walker, X-ray 02/11/23 thoracic, lumbar spine no compression deformities, mild chronic compression fx lf L1(10%)             R hip/buttock pain, able to bear weight and walking, X-ray 12/07/23 L spine/pelvis, decreased disc space, no acute fx or dislocation. C/o R knee pain today, no s/s of injury or infection, able to ROM and weight bearing.  Weigh stable, off  Mirtazapine 07/02/22 09/12/21 choledocholithiasis/ascending cholangitis s/p ERCP and stone removal. Severe sepsis/E coli bacteremia- fully treated.              Chronic cough, on Benzonatate              CKD,  stage 3, stable, Bun/creat 19/1.2 10/27/23             GERD, better indigestion, burping, on Omeprazole . prn Maalox             Edema BLE, off Furosemide, not new,  EF 50-55% 06/01/2017.             HTN, takes Amlodipine              Hyperlipidemia, LDL 77 06/18/21, off Rosuvastatin              Anemia,  Vit B12 228 04/03/20, added Vit B 216/22, off Fe, Hgb 12.2 10/27/23             Osteopenia,  DEXA 07/15/22 t score -2.275, on Ca, Vit D. 03/07/23 offered Alendronate if desires-declined.   Past Medical History:  Diagnosis Date   Arthritis    right thumb (01/29/2015)   Chronic shoulder pain    between my shoulders (01/29/2015)   Dizziness    Esophageal stricture 2009   Gallstones    GERD (gastroesophageal reflux disease) 2004   Hemorrhoids 2004   Hiatal hernia 2004   History of blood transfusion 1947   appendix ruptured   HOH (hard of hearing)    Hypertension    Rhinitis, allergic    Syncopal episodes    Past Surgical History:  Procedure Laterality Date  APPENDECTOMY  1947   ruptured   CARDIAC CATHETERIZATION  10/23/1999; 09/2014   EF 60% No significant CAD; patent coronary arteries.   CARPAL TUNNEL RELEASE Right 1990's   CHOLECYSTECTOMY N/A 01/29/2015   Procedure: LAPAROSCOPIC CHOLECYSTECTOMY;  Surgeon: Donnice Bury, MD;  Location: [facility] OR;  Service: General;  Laterality: N/A;   DILATION AND CURETTAGE OF UTERUS  couple   ERCP N/A 09/13/2021   Procedure: ENDOSCOPIC RETROGRADE CHOLANGIOPANCREATOGRAPHY (ERCP);  Surgeon: Rollin Dover, MD;  Location: [facility];  Service: Gastroenterology;  Laterality: N/A;   ESOPHAGOGASTRODUODENOSCOPY (EGD) WITH ESOPHAGEAL DILATION  2-3 times   HEMORROIDECTOMY     KNEE ARTHROSCOPY Bilateral    right x 2   LAPAROSCOPIC CHOLECYSTECTOMY  01/29/2015   LEFT HEART CATHETERIZATION WITH CORONARY ANGIOGRAM N/A 09/16/2014   Procedure: LEFT HEART CATHETERIZATION WITH CORONARY ANGIOGRAM;  Surgeon: Dorn JINNY Lesches, MD;  Location: [facility] CATH LAB;  Service:  Cardiovascular;  Laterality: N/A;   NM MYOVIEW  LTD  06/01/2012   EF 71%   REMOVAL OF STONES  09/13/2021   Procedure: REMOVAL OF STONES;  Surgeon: Rollin Dover, MD;  Location: [facility];  Service: Gastroenterology;;   ANNETT  09/13/2021   Procedure: ANNETT;  Surgeon: Rollin Dover, MD;  Location: [facility];  Service: Gastroenterology;;   VAGINAL HYSTERECTOMY  1970's    Allergies  Allergen Reactions   Contrast Media [Iodinated Contrast Media] Itching and Rash    Pt covered in rash, red.   Codeine Nausea And Vomiting    in large doses: can tolerate Tussionex    Allergies as of 03/09/2024       Reactions   Contrast Media [iodinated Contrast Media] Itching, Rash   Pt covered in rash, red.   Codeine Nausea And Vomiting   in large doses: can tolerate Tussionex        Medication List        Accurate as of March 09, 2024 11:59 PM. If you have any questions, ask your nurse or doctor.          acetaminophen  325 MG tablet Commonly known as: TYLENOL  Take 650 mg by mouth every 6 (six) hours as needed for mild pain.   alum & mag hydroxide-simeth 400-400-40 MG/5ML suspension Commonly known as: MAALOX PLUS Take 10 mLs by mouth every 6 (six) hours as needed for indigestion or heartburn.   alum & mag hydroxide-simeth 200-200-20 MG/5ML suspension Commonly known as: MAALOX/MYLANTA Take by mouth every 6 (six) hours as needed for indigestion or heartburn.   amLODipine  10 MG tablet Commonly known as: NORVASC  Take 10 mg by mouth daily.   calcium  carbonate 1500 (600 Ca) MG Tabs tablet Commonly known as: OSCAL Take 600 mg of elemental calcium  by mouth daily.   cyanocobalamin  1000 MCG tablet Commonly known as: VITAMIN B12 Take 1,000 mcg by mouth daily.   dextromethorphan -guaiFENesin  10-100 MG/5ML liquid Commonly known as: ROBITUSSIN-DM Take by mouth every 6 (six) hours as needed for cough.   lactose free nutrition Liqd Take 237 mLs by mouth daily.    lidocaine  4 % Place 1 patch onto the skin every 12 (twelve) hours as needed (back pain). Remove & Discard patch within 12 hours or as directed by MD   memantine 10 MG tablet Commonly known as: NAMENDA Take 10 mg by mouth 2 (two) times daily.   omeprazole  40 MG capsule Commonly known as: PRILOSEC Take 40 mg by mouth daily.   ondansetron  4 MG disintegrating tablet Commonly known as: ZOFRAN -ODT Take 4 mg by mouth every  6 (six) hours as needed for nausea or vomiting.   Vitamin D2 50 MCG (2000 UT) Tabs Take by mouth.        Review of Systems  Constitutional:  Negative for appetite change, fatigue and fever.  HENT:  Positive for hearing loss. Negative for congestion and trouble swallowing.   Eyes:  Negative for visual disturbance.  Respiratory:  Positive for cough. Negative for chest tightness and shortness of breath.        Baseline cough.   Cardiovascular:  Positive for leg swelling.  Gastrointestinal:  Negative for abdominal pain.  Genitourinary:  Positive for frequency. Negative for dysuria and urgency.       Baseline urinary frequency  Musculoskeletal:  Positive for arthralgias, back pain and gait problem.  Skin:  Negative for color change.  Neurological:  Negative for speech difficulty and weakness.       Memory lapses.   Psychiatric/Behavioral:  Negative for behavioral problems and sleep disturbance. The patient is not nervous/anxious.     Immunization History  Administered Date(s) Administered   Fluad Quad(high Dose 65+) 03/29/2022   INFLUENZA, HIGH DOSE SEASONAL PF 03/14/2013   Influenza Split 03/16/2011, 03/01/2012   Influenza Whole 03/28/2007, 04/01/2009, 02/18/2010   Influenza,inj,Quad PF,6+ Mos 03/20/2014, 03/11/2015   Influenza-Unspecified 08/06/2018, 03/20/2020   Moderna SARS-COV2 Booster Vaccination 11/04/2020   Moderna Sars-Covid-2 Vaccination 06/11/2019, 07/09/2019, 02/24/2021   PPD Test 04/23/2020, 11/03/2021   Pfizer Covid-19 Vaccine Bivalent Booster  64yrs & up 04/08/2022   Pneumococcal Conjugate-13 02/25/2015   Pneumococcal Polysaccharide-23 03/28/2007   Td 12/20/2005   Td (Adult) 07/12/2023   Zoster Recombinant(Shingrix) 07/13/2022, 10/12/2022   Pertinent  Health Maintenance Due  Topic Date Due   Influenza Vaccine  01/06/2024   DEXA SCAN  Completed   Mammogram  Discontinued      09/15/2021    3:00 PM 09/15/2021    8:00 PM 09/16/2021   10:06 AM 07/02/2022    9:42 AM 07/06/2022    1:44 PM  Fall Risk  Falls in the past year?    0 0  Was there an injury with Fall?    0 0  Fall Risk Category Calculator    0 0  (RETIRED) Patient Fall Risk Level High fall risk  High fall risk  High fall risk     Patient at Risk for Falls Due to    No Fall Risks No Fall Risks  Fall risk Follow up    Falls evaluation completed Falls evaluation completed     Data saved with a previous flowsheet row definition   Functional Status Survey:    Vitals:   03/09/24 1344  BP: 128/70  Pulse: 70  Resp: 18  Temp: (!) 97.5 F (36.4 C)  SpO2: 95%  Weight: 193 lb 4.8 oz (87.7 kg)   Body mass index is 37.75 kg/m. Physical Exam Constitutional:      Appearance: Normal appearance.  HENT:     Head: Normocephalic and atraumatic.     Mouth/Throat:     Mouth: Mucous membranes are moist.  Eyes:     Extraocular Movements: Extraocular movements intact.     Pupils: Pupils are equal, round, and reactive to light.  Cardiovascular:     Rate and Rhythm: Normal rate and regular rhythm.     Heart sounds: No murmur heard. Pulmonary:     Effort: Pulmonary effort is normal.     Breath sounds: No rales.  Abdominal:     General: Bowel sounds are normal.  Palpations: Abdomen is soft.     Tenderness: There is no abdominal tenderness.  Musculoskeletal:        General: Tenderness present.     Cervical back: Normal range of motion and neck supple.     Right lower leg: Edema present.     Left lower leg: Edema present.     Comments: BLE chronic venous  insufficiency skin changes. Trace edema BLE.  Lower back pain about T2/L1 region Pain palpated R hip/buttock C/o R knee pain, able to ROM and weight bearing  Skin:    General: Skin is warm and dry.  Neurological:     General: No focal deficit present.     Mental Status: She is alert. Mental status is at baseline.     Gait: Gait abnormal.     Comments: Oriented to person, her room on unit. Habitual picking up objects when walking around facility.   Psychiatric:        Mood and Affect: Mood normal.        Behavior: Behavior normal.     Labs reviewed: Recent Labs    05/10/23 0000 05/24/23 0000 10/27/23 0000  NA 140 139 136*  K 4.2 4.0 4.3  CL 104 106 101  CO2 26* 27* 28*  BUN 21 30* 19  CREATININE 1.3* 1.5* 1.2*  CALCIUM  9.5 8.6* 9.0   Recent Labs    05/10/23 0000 05/24/23 0000  AST 14 12*  ALT 8 8  ALKPHOS 96 82  ALBUMIN  6.2* 3.4*   Recent Labs    05/10/23 0000 05/24/23 0000 10/27/23 0000  WBC 8.3 7.8 7.6  NEUTROABS 5,013.00 4,703.00  --   HGB 14.1 12.9 12.2  HCT 43 40 38  PLT 250 209 228   Lab Results  Component Value Date   TSH 1.313 09/13/2021   Lab Results  Component Value Date   HGBA1C 5.8 07/11/2017   Lab Results  Component Value Date   CHOL 136 06/18/2021   HDL 34 (A) 06/18/2021   LDLCALC 77 06/18/2021   LDLDIRECT 161.2 07/18/2013   TRIG 151 06/18/2021   CHOLHDL 2.9 04/03/2020    Significant Diagnostic Results in last 30 days:  No results found.  Assessment/Plan  Hypertension Blood pressure is controlled, continue amlodipine   Hyperlipidemia LDL 77 06/18/21, off Rosuvastatin   Vitamin B12 deficiency anemia  Vit B12 228 04/03/20, added Vit B 216/22, off Fe, Hgb 12.2 10/27/23  OSTEOPENIA  DEXA 07/15/22 t score -2.275, on Ca, Vit D. 03/07/23 offered Alendronate if desires-declined.   COUGH, CHRONIC on Benzonatate   CKD (chronic kidney disease) stage 3, GFR 30-59 ml/min (HCC)  stable, Bun/creat 19/1.2 10/27/23  GERD  (gastroesophageal reflux disease)  better indigestion, burping, on Omeprazole . prn Maalox  Major neurocognitive disorder (HCC) MMSE 19/30 02/22/23, tolerated Namenda well, TSH 3.4 04/05/23, functional well in memory care unit   Osteoarthritis, multiple sites In general, ambulates with walker slowly   Family/ staff Communication: Plan of care reviewed with the patient and charge nurse  Labs/tests ordered: None

## 2024-03-09 NOTE — Assessment & Plan Note (Signed)
 In general, ambulates with walker slowly

## 2024-03-14 ENCOUNTER — Encounter: Payer: Self-pay | Admitting: Sports Medicine

## 2024-03-14 ENCOUNTER — Non-Acute Institutional Stay (SKILLED_NURSING_FACILITY): Payer: Self-pay | Admitting: Sports Medicine

## 2024-03-14 DIAGNOSIS — M7989 Other specified soft tissue disorders: Secondary | ICD-10-CM

## 2024-03-14 DIAGNOSIS — I10 Essential (primary) hypertension: Secondary | ICD-10-CM | POA: Diagnosis not present

## 2024-03-14 DIAGNOSIS — K219 Gastro-esophageal reflux disease without esophagitis: Secondary | ICD-10-CM

## 2024-03-14 NOTE — Progress Notes (Unsigned)
 Location:  [facility] Room Number: 107-A Place of Service:  SNF (31) Provider:  Sherlynn Madden, MD  Sherlynn Madden, MD  Patient Care Team: Sherlynn Madden, MD as PCP - General (Internal Medicine) Court Dorn PARAS, MD as Consulting Physician (Cardiology)  Extended Emergency Contact Information Primary Emergency Contact: Mathis Mems Address: [address]          [place], [place] [address] [place] Home Phone: [phone] Mobile Phone: [phone] Relation: Niece  Code Status:  DNR  Goals of care: Advanced Directive information    03/14/2024    2:19 PM  Advanced Directives  Does Patient Have a Medical Advance Directive? Yes  Type of Estate agent of [place];Out of facility DNR (pink MOST or yellow form)  Does patient want to make changes to medical advance directive? No - Patient declined  Copy of Healthcare Power of Attorney in Chart? Yes - validated most recent copy scanned in chart (See row information)  Pre-existing out of facility DNR order (yellow form or pink MOST form) Pink MOST form placed in chart (order not valid for inpatient use)     Chief Complaint  Patient presents with  . Acute Visit    Lower extremity swelling.     HPI:  Pt is a 89 y.o. female seen today for an acute visit for  lower extremity swelling  Pt seen and examined in her room  She seems pleasant and comfortable and does not appear to be in distress Pt denies chest pain, palpitations, SOB, abdominal pain, nausea, vomiting, dysuria, hematuria, bloody or dark stools. Pt has chronic lower extremity swelling   BASIC METABOLIC PANEL GLUCOSE 82 mg/dL 34-00 Final  Fasting reference interval UREA NITROGEN (BUN) 19 mg/dL 2-74 Final CREATININE 8.82 mg/dL 9.39-9.04 H Final EGFR 44 mL/min/1.73 m2 > OR = 60 L Final BUN/CREATININE RATIO 16 (calc) 6-22 Final SODIUM 136 mmol/L 135-146 Final POTASSIUM 4.3 mmol/L  3.5-5.3 Final CHLORIDE 101 mmol/L 98-110 Final CARBON DIOXIDE 28 mmol/L 20-32 Final CALCIUM  9.0 mg/dL 1.3-89.5 Final   89/98/7974 11:52 193.3 Lbs (Standing) 02/13/2024 15:41 194.5 Lbs (Standing) 02/06/2024 13:38 194.5 Lbs (Standing) 01/06/2024 11:04 190.4 Lbs (Standing) 12/06/2023 12:59 189.7 Lbs (Standing) 11/30/2023 08:06 190 Lbs (Standing) 11/23/2023 12:06 191.1 Lbs (Standing) 11/16/2023 12:45 192.1 Lbs (Standing)   Past Medical History:  Diagnosis Date  . Arthritis    right thumb (01/29/2015)  . Chronic shoulder pain    between my shoulders (01/29/2015)  . Dizziness   . Esophageal stricture 2009  . Gallstones   . GERD (gastroesophageal reflux disease) 2004  . Hemorrhoids 2004  . Hiatal hernia 2004  . History of blood transfusion 1947   appendix ruptured  . HOH (hard of hearing)   . Hypertension   . Rhinitis, allergic   . Syncopal episodes    Past Surgical History:  Procedure Laterality Date  . APPENDECTOMY  1947   ruptured  . CARDIAC CATHETERIZATION  10/23/1999; 09/2014   EF 60% No significant CAD; patent coronary arteries.  . CARPAL TUNNEL RELEASE Right 1990's  . CHOLECYSTECTOMY N/A 01/29/2015   Procedure: LAPAROSCOPIC CHOLECYSTECTOMY;  Surgeon: Donnice Bury, MD;  Location: [facility] OR;  Service: General;  Laterality: N/A;  . DILATION AND CURETTAGE OF UTERUS  couple  . ERCP N/A 09/13/2021   Procedure: ENDOSCOPIC RETROGRADE CHOLANGIOPANCREATOGRAPHY (ERCP);  Surgeon: Rollin Dover, MD;  Location: [facility];  Service: Gastroenterology;  Laterality: N/A;  . ESOPHAGOGASTRODUODENOSCOPY (EGD) WITH ESOPHAGEAL DILATION  2-3 times  . HEMORROIDECTOMY    .  KNEE ARTHROSCOPY Bilateral    right x 2  . LAPAROSCOPIC CHOLECYSTECTOMY  01/29/2015  . LEFT HEART CATHETERIZATION WITH CORONARY ANGIOGRAM N/A 09/16/2014   Procedure: LEFT HEART CATHETERIZATION WITH CORONARY ANGIOGRAM;  Surgeon: Dorn JINNY Lesches, MD;  Location: [facility] CATH LAB;  Service: Cardiovascular;  Laterality: N/A;   . NM MYOVIEW  LTD  06/01/2012   EF 71%  . REMOVAL OF STONES  09/13/2021   Procedure: REMOVAL OF STONES;  Surgeon: Rollin Dover, MD;  Location: [facility];  Service: Gastroenterology;;  . ANNETT  09/13/2021   Procedure: ANNETT;  Surgeon: Rollin Dover, MD;  Location: [facility];  Service: Gastroenterology;;  . VAGINAL HYSTERECTOMY  1970's    Allergies  Allergen Reactions  . Contrast Media [Iodinated Contrast Media] Itching and Rash    Pt covered in rash, red.  . Codeine Nausea And Vomiting    in large doses: can tolerate Tussionex    Allergies as of 03/14/2024       Reactions   Contrast Media [iodinated Contrast Media] Itching, Rash   Pt covered in rash, red.   Codeine Nausea And Vomiting   in large doses: can tolerate Tussionex        Medication List        Accurate as of March 14, 2024  2:26 PM. If you have any questions, ask your nurse or doctor.          acetaminophen  325 MG tablet Commonly known as: TYLENOL  Take 650 mg by mouth every 6 (six) hours as needed for mild pain.   alum & mag hydroxide-simeth 400-400-40 MG/5ML suspension Commonly known as: MAALOX PLUS Take 10 mLs by mouth every 6 (six) hours as needed for indigestion or heartburn.   alum & mag hydroxide-simeth 200-200-20 MG/5ML suspension Commonly known as: MAALOX/MYLANTA Take by mouth every 6 (six) hours as needed for indigestion or heartburn.   amLODipine  10 MG tablet Commonly known as: NORVASC  Take 10 mg by mouth daily.   calcium  carbonate 1500 (600 Ca) MG Tabs tablet Commonly known as: OSCAL Take 600 mg of elemental calcium  by mouth daily.   cyanocobalamin  1000 MCG tablet Commonly known as: VITAMIN B12 Take 1,000 mcg by mouth daily.   dextromethorphan -guaiFENesin  10-100 MG/5ML liquid Commonly known as: ROBITUSSIN-DM Take by mouth every 6 (six) hours as needed for cough.   lactose free nutrition Liqd Take 237 mLs by mouth daily.   lidocaine  4 % Place 1 patch onto  the skin every 12 (twelve) hours as needed (back pain). Remove & Discard patch within 12 hours or as directed by MD   memantine 10 MG tablet Commonly known as: NAMENDA Take 10 mg by mouth 2 (two) times daily.   omeprazole  40 MG capsule Commonly known as: PRILOSEC Take 40 mg by mouth daily.   ondansetron  4 MG disintegrating tablet Commonly known as: ZOFRAN -ODT Take 4 mg by mouth every 6 (six) hours as needed for nausea or vomiting.   Vitamin D2 50 MCG (2000 UT) Tabs Take by mouth.        Review of Systems  Constitutional:  Negative for fever.  Respiratory:  Negative for cough, shortness of breath and wheezing.   Cardiovascular:  Positive for leg swelling. Negative for chest pain.  Gastrointestinal:  Negative for abdominal pain, blood in stool, constipation, diarrhea, nausea and vomiting.  Genitourinary:  Negative for dysuria.  Neurological:  Negative for dizziness.    Immunization History  Administered Date(s) Administered  . Fluad Quad(high Dose 65+) 03/29/2022  . INFLUENZA, HIGH  DOSE SEASONAL PF 03/14/2013  . Influenza Split 03/16/2011, 03/01/2012  . Influenza Whole 03/28/2007, 04/01/2009, 02/18/2010  . Influenza,inj,Quad PF,6+ Mos 03/20/2014, 03/11/2015  . Influenza-Unspecified 08/06/2018, 03/20/2020  . Moderna SARS-COV2 Booster Vaccination 11/04/2020  . Moderna Sars-Covid-2 Vaccination 06/11/2019, 07/09/2019, 02/24/2021  . PPD Test 04/23/2020, 11/03/2021  . Pfizer Covid-19 Vaccine Bivalent Booster 46yrs & up 04/08/2022  . Pneumococcal Conjugate-13 02/25/2015  . Pneumococcal Polysaccharide-23 03/28/2007  . Td 12/20/2005  . Td (Adult) 07/12/2023  . Zoster Recombinant(Shingrix) 07/13/2022, 10/12/2022   Pertinent  Health Maintenance Due  Topic Date Due  . DEXA SCAN  Completed  . Influenza Vaccine  Discontinued  . Mammogram  Discontinued      09/15/2021    3:00 PM 09/15/2021    8:00 PM 09/16/2021   10:06 AM 07/02/2022    9:42 AM 07/06/2022    1:44 PM  Fall Risk   Falls in the past year?    0 0  Was there an injury with Fall?    0 0  Fall Risk Category Calculator    0 0  (RETIRED) Patient Fall Risk Level High fall risk  High fall risk  High fall risk     Patient at Risk for Falls Due to    No Fall Risks No Fall Risks  Fall risk Follow up    Falls evaluation completed Falls evaluation completed     Data saved with a previous flowsheet row definition   Functional Status Survey:    Vitals:   03/14/24 1415  BP: 134/80  Pulse: 77  Resp: 18  Temp: 97.7 F (36.5 C)  SpO2: 95%  Weight: 193 lb 4.8 oz (87.7 kg)  Height: 5' (1.524 m)   Body mass index is 37.75 kg/m. Physical Exam  Labs reviewed: Recent Labs    05/10/23 0000 05/24/23 0000 10/27/23 0000  NA 140 139 136*  K 4.2 4.0 4.3  CL 104 106 101  CO2 26* 27* 28*  BUN 21 30* 19  CREATININE 1.3* 1.5* 1.2*  CALCIUM  9.5 8.6* 9.0   Recent Labs    05/10/23 0000 05/24/23 0000  AST 14 12*  ALT 8 8  ALKPHOS 96 82  ALBUMIN  6.2* 3.4*   Recent Labs    05/10/23 0000 05/24/23 0000 10/27/23 0000  WBC 8.3 7.8 7.6  NEUTROABS 5,013.00 4,703.00  --   HGB 14.1 12.9 12.2  HCT 43 40 38  PLT 250 209 228   Lab Results  Component Value Date   TSH 1.313 09/13/2021   Lab Results  Component Value Date   HGBA1C 5.8 07/11/2017   Lab Results  Component Value Date   CHOL 136 06/18/2021   HDL 34 (A) 06/18/2021   LDLCALC 77 06/18/2021   LDLDIRECT 161.2 07/18/2013   TRIG 151 06/18/2021   CHOLHDL 2.9 04/03/2020    Significant Diagnostic Results in last 30 days:  No results found.  Assessment/Plan   Lower extremity swelling  Elevate feet  Use compression stockings Limit salt intake  Will decrease amlodipine  to 5 mg   HTN  Will decrease amlodipine  to 5 mg  Avoid salty foods  GERD Cont with omeprazole       Family/ staff Communication:   Labs/tests ordered:

## 2024-03-15 ENCOUNTER — Encounter: Payer: Self-pay | Admitting: Sports Medicine

## 2024-03-29 DIAGNOSIS — E559 Vitamin D deficiency, unspecified: Secondary | ICD-10-CM | POA: Diagnosis not present

## 2024-03-29 DIAGNOSIS — D519 Vitamin B12 deficiency anemia, unspecified: Secondary | ICD-10-CM | POA: Diagnosis not present

## 2024-03-29 LAB — VITAMIN D 25 HYDROXY (VIT D DEFICIENCY, FRACTURES): Vit D, 25-Hydroxy: 40

## 2024-03-29 LAB — VITAMIN B12: Vitamin B-12: 726

## 2024-04-24 ENCOUNTER — Non-Acute Institutional Stay (SKILLED_NURSING_FACILITY): Payer: Self-pay | Admitting: Nurse Practitioner

## 2024-04-24 ENCOUNTER — Encounter: Payer: Self-pay | Admitting: Nurse Practitioner

## 2024-04-24 DIAGNOSIS — I872 Venous insufficiency (chronic) (peripheral): Secondary | ICD-10-CM

## 2024-04-24 DIAGNOSIS — F0390 Unspecified dementia without behavioral disturbance: Secondary | ICD-10-CM

## 2024-04-24 DIAGNOSIS — M949 Disorder of cartilage, unspecified: Secondary | ICD-10-CM | POA: Diagnosis not present

## 2024-04-24 DIAGNOSIS — M899 Disorder of bone, unspecified: Secondary | ICD-10-CM

## 2024-04-24 DIAGNOSIS — K219 Gastro-esophageal reflux disease without esophagitis: Secondary | ICD-10-CM

## 2024-04-24 DIAGNOSIS — N1831 Chronic kidney disease, stage 3a: Secondary | ICD-10-CM

## 2024-04-24 DIAGNOSIS — I10 Essential (primary) hypertension: Secondary | ICD-10-CM | POA: Diagnosis not present

## 2024-04-24 DIAGNOSIS — D519 Vitamin B12 deficiency anemia, unspecified: Secondary | ICD-10-CM

## 2024-04-24 DIAGNOSIS — E785 Hyperlipidemia, unspecified: Secondary | ICD-10-CM

## 2024-04-24 NOTE — Assessment & Plan Note (Signed)
LDL 77 06/18/21, off Rosuvastatin 

## 2024-04-24 NOTE — Assessment & Plan Note (Signed)
 DEXA 07/15/22 t score -2.275, on Ca, Vit D 03/07/23 offered Alendronate if desires-declined.

## 2024-04-24 NOTE — Assessment & Plan Note (Signed)
better indigestion, burping, on Omeprazole. prn Maalox 

## 2024-04-24 NOTE — Assessment & Plan Note (Signed)
 Vit B12 228 04/03/20<<726 03/29/24, added Vit B 216/22, off Fe, Hgb 12.2 10/27/23

## 2024-04-24 NOTE — Assessment & Plan Note (Signed)
 SBP is elevated, amlodipine  was decreased to 5 mg / 10 mg daily in the past Will increase amlodipine  to 7.5 mg by mouth daily, monitor blood pressure daily

## 2024-04-24 NOTE — Assessment & Plan Note (Signed)
 Trace edema,  off Furosemide, not new,  EF 50-55% 06/01/2017.

## 2024-04-24 NOTE — Progress Notes (Unsigned)
 Location:  [facility] Room Number: 107 A Place of Service:  SNF (31) Provider:  Laretha Luepke X, NP   Patient Care Team: Sherlynn Madden, MD as PCP - General (Internal Medicine) Court Dorn PARAS, MD as Consulting Physician (Cardiology)  Extended Emergency Contact Information Primary Emergency Contact: Mathis Mems Address: [address]          [place], [place] [address] [place] Home Phone: [phone] Mobile Phone: [phone] Relation: Niece  Code Status:  DNR Goals of care: Advanced Directive information    03/14/2024    2:19 PM  Advanced Directives  Does Patient Have a Medical Advance Directive? Yes  Type of Estate Agent of [place];Out of facility DNR (pink MOST or yellow form)  Does patient want to make changes to medical advance directive? No - Patient declined  Copy of Healthcare Power of Attorney in Chart? Yes - validated most recent copy scanned in chart (See row information)  Pre-existing out of facility DNR order (yellow form or pink MOST form) Pink MOST form placed in chart (order not valid for inpatient use)     Chief Complaint  Patient presents with   Routine Visit    HPI:  Pt is a 89 y.o. female seen today for medical management of chronic diseases.     Senile dementia, MMSE 19/30 02/22/23, tolerated Namenda well, TSH 3.4 04/05/23, functional well in memory care unit              Lower back pain, mild, ambulates with walker, X-ray 02/11/23 thoracic, lumbar spine no compression deformities, mild chronic compression fx lf L1(10%)             R hip/buttock pain, able to bear weight and walking, X-ray 12/07/23 L spine/pelvis, decreased disc space, no acute fx or dislocation. Chronic R knee pain. Ambulates with walker Weigh stable, off  Mirtazapine 07/02/22 09/12/21 choledocholithiasis/ascending cholangitis s/p ERCP and stone removal. Severe sepsis/E coli bacteremia- fully treated.               Chronic cough, on Benzonatate              CKD, stage 3, stable, Bun/creat 19/1.2 10/27/23             GERD, better indigestion, burping, on Omeprazole . prn Maalox             Edema BLE, off Furosemide, not new,  EF 50-55% 06/01/2017.             HTN,  Sbp elevated, takes Amlodipine  was decreased to 5mg  every day, will increase to 7.5mg  every day 04/24/24             Hyperlipidemia, LDL 77 06/18/21, off Rosuvastatin              Anemia,  Vit B12 228 04/03/20<<726 03/29/24, added Vit B 216/22, off Fe, Hgb 12.2 10/27/23             Osteopenia,  DEXA 07/15/22 t score -2.275, on Ca, Vit D. 03/07/23 offered Alendronate if desires-declined.     Past Medical History:  Diagnosis Date   Arthritis    right thumb (01/29/2015)   Chronic shoulder pain    between my shoulders (01/29/2015)   Dizziness    Esophageal stricture 2009   Gallstones    GERD (gastroesophageal reflux disease) 2004   Hemorrhoids 2004   Hiatal hernia 2004   History of blood transfusion 1947   appendix ruptured  HOH (hard of hearing)    Hypertension    Rhinitis, allergic    Syncopal episodes    Past Surgical History:  Procedure Laterality Date   APPENDECTOMY  1947   ruptured   CARDIAC CATHETERIZATION  10/23/1999; 09/2014   EF 60% No significant CAD; patent coronary arteries.   CARPAL TUNNEL RELEASE Right 1990's   CHOLECYSTECTOMY N/A 01/29/2015   Procedure: LAPAROSCOPIC CHOLECYSTECTOMY;  Surgeon: Donnice Bury, MD;  Location: [facility] OR;  Service: General;  Laterality: N/A;   DILATION AND CURETTAGE OF UTERUS  couple   ERCP N/A 09/13/2021   Procedure: ENDOSCOPIC RETROGRADE CHOLANGIOPANCREATOGRAPHY (ERCP);  Surgeon: Rollin Dover, MD;  Location: [facility];  Service: Gastroenterology;  Laterality: N/A;   ESOPHAGOGASTRODUODENOSCOPY (EGD) WITH ESOPHAGEAL DILATION  2-3 times   HEMORROIDECTOMY     KNEE ARTHROSCOPY Bilateral    right x 2   LAPAROSCOPIC CHOLECYSTECTOMY  01/29/2015   LEFT HEART CATHETERIZATION WITH CORONARY  ANGIOGRAM N/A 09/16/2014   Procedure: LEFT HEART CATHETERIZATION WITH CORONARY ANGIOGRAM;  Surgeon: Dorn JINNY Lesches, MD;  Location: [facility] CATH LAB;  Service: Cardiovascular;  Laterality: N/A;   NM MYOVIEW  LTD  06/01/2012   EF 71%   REMOVAL OF STONES  09/13/2021   Procedure: REMOVAL OF STONES;  Surgeon: Rollin Dover, MD;  Location: [facility];  Service: Gastroenterology;;   ANNETT  09/13/2021   Procedure: ANNETT;  Surgeon: Rollin Dover, MD;  Location: [facility];  Service: Gastroenterology;;   VAGINAL HYSTERECTOMY  1970's    Allergies  Allergen Reactions   Contrast Media [Iodinated Contrast Media] Itching and Rash    Pt covered in rash, red.   Codeine Nausea And Vomiting    in large doses: can tolerate Tussionex    Outpatient Encounter Medications as of 04/24/2024  Medication Sig   acetaminophen  (TYLENOL ) 325 MG tablet Take 650 mg by mouth every 6 (six) hours as needed for mild pain.   alum & mag hydroxide-simeth (MAALOX PLUS) 400-400-40 MG/5ML suspension Take 10 mLs by mouth every 6 (six) hours as needed for indigestion or heartburn.   amLODipine  (NORVASC ) 5 MG tablet Take 5 mg by mouth daily.   calcium  carbonate (OSCAL) 1500 (600 Ca) MG TABS tablet Take 600 mg of elemental calcium  by mouth daily.   dextromethorphan -guaiFENesin  (ROBITUSSIN-DM) 10-100 MG/5ML liquid Take by mouth every 6 (six) hours as needed for cough.   Ergocalciferol  (VITAMIN D2) 50 MCG (2000 UT) TABS Take by mouth.   lactose free nutrition (BOOST) LIQD Take 237 mLs by mouth daily.   Lidocaine  (LIDOCAN EX) Apply topically. Apply to (R) lower back topically every 24 hours as needed for Apply to (R) lower back as needed for pain related to LOW BACK PAIN, UNSPECIFIED (M54.50) 4% patch   lidocaine  4 % Place 1 patch onto the skin every 12 (twelve) hours as needed (back pain). Remove & Discard patch within 12 hours or as directed by MD   memantine (NAMENDA) 10 MG tablet Take 10 mg by mouth 2 (two) times  daily.   omeprazole  (PRILOSEC) 40 MG capsule Take 40 mg by mouth daily.   ondansetron  (ZOFRAN -ODT) 4 MG disintegrating tablet Take 4 mg by mouth every 6 (six) hours as needed for nausea or vomiting.   vitamin B-12 (CYANOCOBALAMIN ) 1000 MCG tablet Take 1,000 mcg by mouth daily.   [DISCONTINUED] alum & mag hydroxide-simeth (MAALOX/MYLANTA) 200-200-20 MG/5ML suspension Take by mouth every 6 (six) hours as needed for indigestion or heartburn. (Patient not taking: Reported on 04/24/2024)   [DISCONTINUED] amLODipine  (NORVASC ) 10  MG tablet Take 10 mg by mouth daily. (Patient not taking: Reported on 04/24/2024)   No facility-administered encounter medications on file as of 04/24/2024.    Review of Systems  Constitutional:  Negative for appetite change, fatigue and fever.  HENT:  Positive for hearing loss. Negative for congestion and trouble swallowing.   Eyes:  Negative for visual disturbance.  Respiratory:  Positive for cough. Negative for chest tightness and shortness of breath.        Baseline cough.   Cardiovascular:  Positive for leg swelling.  Gastrointestinal:  Negative for abdominal pain.  Genitourinary:  Positive for frequency. Negative for dysuria and urgency.       Baseline urinary frequency  Musculoskeletal:  Positive for arthralgias, back pain and gait problem.  Skin:  Negative for color change.  Neurological:  Negative for speech difficulty and weakness.       Memory lapses.   Psychiatric/Behavioral:  Negative for behavioral problems and sleep disturbance. The patient is not nervous/anxious.     Immunization History  Administered Date(s) Administered   Fluad Quad(high Dose 65+) 03/29/2022   INFLUENZA, HIGH DOSE SEASONAL PF 03/14/2013   Influenza Split 03/16/2011, 03/01/2012   Influenza Whole 03/28/2007, 04/01/2009, 02/18/2010   Influenza,inj,Quad PF,6+ Mos 03/20/2014, 03/11/2015   Influenza-Unspecified 08/06/2018, 03/20/2020   Moderna SARS-COV2 Booster Vaccination 11/04/2020    Moderna Sars-Covid-2 Vaccination 06/11/2019, 07/09/2019, 02/24/2021   PPD Test 04/23/2020, 11/03/2021   Pfizer Covid-19 Vaccine Bivalent Booster 91yrs & up 04/08/2022   Pneumococcal Conjugate-13 02/25/2015   Pneumococcal Polysaccharide-23 03/28/2007   Td 12/20/2005   Td (Adult) 07/12/2023   Zoster Recombinant(Shingrix) 07/13/2022, 10/12/2022   Pertinent  Health Maintenance Due  Topic Date Due   DEXA SCAN  Completed   Influenza Vaccine  Discontinued   Mammogram  Discontinued      09/15/2021    3:00 PM 09/15/2021    8:00 PM 09/16/2021   10:06 AM 07/02/2022    9:42 AM 07/06/2022    1:44 PM  Fall Risk  Falls in the past year?    0 0  Was there an injury with Fall?    0 0  Fall Risk Category Calculator    0 0  (RETIRED) Patient Fall Risk Level High fall risk  High fall risk  High fall risk     Patient at Risk for Falls Due to    No Fall Risks No Fall Risks  Fall risk Follow up    Falls evaluation completed Falls evaluation completed     Data saved with a previous flowsheet row definition   Functional Status Survey:    Vitals:   04/24/24 1506 04/24/24 1555  BP: (!) 167/78 (!) 149/71  Pulse: 71   Resp: 18   Temp: (!) 97.5 F (36.4 C)   SpO2: 95%   Weight: 198 lb 4.8 oz (89.9 kg)   Height: 5' (1.524 m)    Body mass index is 38.73 kg/m. Physical Exam Constitutional:      Appearance: Normal appearance.  HENT:     Head: Normocephalic and atraumatic.     Mouth/Throat:     Mouth: Mucous membranes are moist.  Eyes:     Extraocular Movements: Extraocular movements intact.     Pupils: Pupils are equal, round, and reactive to light.  Cardiovascular:     Rate and Rhythm: Normal rate and regular rhythm.     Heart sounds: No murmur heard. Pulmonary:     Effort: Pulmonary effort is normal.  Breath sounds: No rales.  Abdominal:     General: Bowel sounds are normal.     Palpations: Abdomen is soft.     Tenderness: There is no abdominal tenderness.  Musculoskeletal:         General: Tenderness present.     Cervical back: Normal range of motion and neck supple.     Right lower leg: Edema present.     Left lower leg: Edema present.     Comments: BLE chronic venous insufficiency skin changes. Trace edema BLE.  Lower back pain about T2/L1 region Pain palpated R hip/buttock C/o R knee pain, able to ROM and weight bearing  Skin:    General: Skin is warm and dry.  Neurological:     General: No focal deficit present.     Mental Status: She is alert. Mental status is at baseline.     Gait: Gait abnormal.     Comments: Oriented to person, her room on unit. Habitual picking up objects when walking around facility.   Psychiatric:        Mood and Affect: Mood normal.        Behavior: Behavior normal.     Labs reviewed: Recent Labs    05/10/23 0000 05/24/23 0000 10/27/23 0000  NA 140 139 136*  K 4.2 4.0 4.3  CL 104 106 101  CO2 26* 27* 28*  BUN 21 30* 19  CREATININE 1.3* 1.5* 1.2*  CALCIUM  9.5 8.6* 9.0   Recent Labs    05/10/23 0000 05/24/23 0000  AST 14 12*  ALT 8 8  ALKPHOS 96 82  ALBUMIN  6.2* 3.4*   Recent Labs    05/10/23 0000 05/24/23 0000 10/27/23 0000  WBC 8.3 7.8 7.6  NEUTROABS 5,013.00 4,703.00  --   HGB 14.1 12.9 12.2  HCT 43 40 38  PLT 250 209 228   Lab Results  Component Value Date   TSH 1.313 09/13/2021   Lab Results  Component Value Date   HGBA1C 5.8 07/11/2017   Lab Results  Component Value Date   CHOL 136 06/18/2021   HDL 34 (A) 06/18/2021   LDLCALC 77 06/18/2021   LDLDIRECT 161.2 07/18/2013   TRIG 151 06/18/2021   CHOLHDL 2.9 04/03/2020    Significant Diagnostic Results in last 30 days:  No results found.  Assessment/Plan Hypertension SBP is elevated, amlodipine  was decreased to 5 mg / 10 mg daily in the past Will increase amlodipine  to 7.5 mg by mouth daily, monitor blood pressure daily  Hyperlipidemia LDL 77 06/18/21, off Rosuvastatin   Vitamin B12 deficiency anemia Vit B12 228 04/03/20<<726  03/29/24, added Vit B 216/22, off Fe, Hgb 12.2 10/27/23  OSTEOPENIA DEXA 07/15/22 t score -2.275, on Ca, Vit D. 03/07/23 offered Alendronate if desires-declined.     Edema of both lower extremities due to peripheral venous insufficiency Trace edema,  off Furosemide, not new,  EF 50-55% 06/01/2017.  GERD better indigestion, burping, on Omeprazole . prn Maalox  CKD (chronic kidney disease) stage 3, GFR 30-59 ml/min (HCC) stable, Bun/creat 19/1.2 10/27/23  Major neurocognitive disorder (HCC)  MMSE 19/30 02/22/23, tolerated Namenda well, TSH 3.4 04/05/23, functional well in memory care unit      Family/ staff Communication: Plan of care reviewed with the patient and charge nurse  Labs/tests ordered: None

## 2024-04-24 NOTE — Assessment & Plan Note (Signed)
 Ambulates with walker in memory care unit.

## 2024-04-24 NOTE — Assessment & Plan Note (Signed)
 stable, Bun/creat 19/1.2 10/27/23

## 2024-04-24 NOTE — Assessment & Plan Note (Signed)
 MMSE 19/30 02/22/23, tolerated Namenda well, TSH 3.4 04/05/23,  functional well in memory care unit

## 2024-04-26 ENCOUNTER — Encounter: Payer: Self-pay | Admitting: Nurse Practitioner

## 2024-06-01 ENCOUNTER — Encounter: Payer: Self-pay | Admitting: Nurse Practitioner

## 2024-06-01 ENCOUNTER — Non-Acute Institutional Stay (SKILLED_NURSING_FACILITY): Payer: Self-pay | Admitting: Nurse Practitioner

## 2024-06-01 DIAGNOSIS — M899 Disorder of bone, unspecified: Secondary | ICD-10-CM | POA: Diagnosis not present

## 2024-06-01 DIAGNOSIS — K219 Gastro-esophageal reflux disease without esophagitis: Secondary | ICD-10-CM

## 2024-06-01 DIAGNOSIS — I872 Venous insufficiency (chronic) (peripheral): Secondary | ICD-10-CM

## 2024-06-01 DIAGNOSIS — N1831 Chronic kidney disease, stage 3a: Secondary | ICD-10-CM

## 2024-06-01 DIAGNOSIS — E785 Hyperlipidemia, unspecified: Secondary | ICD-10-CM | POA: Diagnosis not present

## 2024-06-01 DIAGNOSIS — M150 Primary generalized (osteo)arthritis: Secondary | ICD-10-CM | POA: Diagnosis not present

## 2024-06-01 DIAGNOSIS — F0390 Unspecified dementia without behavioral disturbance: Secondary | ICD-10-CM

## 2024-06-01 DIAGNOSIS — R053 Chronic cough: Secondary | ICD-10-CM

## 2024-06-01 DIAGNOSIS — D519 Vitamin B12 deficiency anemia, unspecified: Secondary | ICD-10-CM | POA: Diagnosis not present

## 2024-06-01 DIAGNOSIS — I10 Essential (primary) hypertension: Secondary | ICD-10-CM

## 2024-06-01 DIAGNOSIS — M949 Disorder of cartilage, unspecified: Secondary | ICD-10-CM | POA: Diagnosis not present

## 2024-06-01 NOTE — Assessment & Plan Note (Signed)
 MMSE 19/30 02/22/23, tolerated Namenda well, TSH 3.4 04/05/23,  functional well in memory care unit

## 2024-06-01 NOTE — Assessment & Plan Note (Signed)
 Vit B12 228 04/03/20<<726 03/29/24, added Vit B 216/22, off Fe, Hgb 12.2 10/27/23

## 2024-06-01 NOTE — Assessment & Plan Note (Signed)
Mild, off Furosemide, not new,  EF 50-55% 06/01/2017.

## 2024-06-01 NOTE — Assessment & Plan Note (Signed)
 DEXA 07/15/22 t score -2.275, on Ca, Vit D 03/07/23 offered Alendronate if desires-declined.

## 2024-06-01 NOTE — Assessment & Plan Note (Signed)
 on Benzonatate, at her baseline.

## 2024-06-01 NOTE — Assessment & Plan Note (Signed)
better indigestion, burping, on Omeprazole. prn Maalox 

## 2024-06-01 NOTE — Assessment & Plan Note (Signed)
 Bun/creat 19/1.2 10/27/23

## 2024-06-01 NOTE — Progress Notes (Signed)
 " Location:   SNF [facility] Room Number: 107 Place of Service:  SNF (31) Provider: Larwance Jobina Maita NP  Sherlynn Madden, MD  Patient Care Team: Sherlynn Madden, MD as PCP - General (Internal Medicine) Court Dorn PARAS, MD as Consulting Physician (Cardiology)  Extended Emergency Contact Information Primary Emergency Contact: Mathis Mems Address: [address]          [place], [place] [address] [place] Home Phone: [phone] Mobile Phone: [phone] Relation: Niece  Code Status:  DNR Goals of care: Advanced Directive information    03/14/2024    2:19 PM  Advanced Directives  Does Patient Have a Medical Advance Directive? Yes  Type of Estate Agent of [place];Out of facility DNR (pink MOST or yellow form)  Does patient want to make changes to medical advance directive? No - Patient declined  Copy of Healthcare Power of Attorney in Chart? Yes - validated most recent copy scanned in chart (See row information)  Pre-existing out of facility DNR order (yellow form or pink MOST form) Pink MOST form placed in chart (order not valid for inpatient use)     Chief Complaint  Patient presents with   Medical Management of Chronic Issues    HPI:  Pt is a 89 y.o. female seen today for medical management of chronic diseases.       Senile dementia, MMSE 19/30 02/22/23, tolerated Namenda well, TSH 3.4 04/05/23, functional well in memory care unit              Lower back pain, mild, ambulates with walker, X-ray 02/11/23 thoracic, lumbar spine no compression deformities, mild chronic compression fx lf L1(10%)             R hip/buttock pain, able to bear weight and walking, X-ray 12/07/23 L spine/pelvis, decreased disc space, no acute fx or dislocation. Chronic R knee pain. Ambulates with walker Weigh stable, off  Mirtazapine 07/02/22 09/12/21 choledocholithiasis/ascending cholangitis s/p ERCP and stone removal. Severe sepsis/E coli  bacteremia- fully treated.              Chronic cough, on Benzonatate              CKD, stage 3, stable, Bun/creat 19/1.2 10/27/23             GERD, better indigestion, burping, on Omeprazole . prn Maalox             Edema BLE, off Furosemide, not new,  EF 50-55% 06/01/2017.             HTN, takes Amlodipine , blood pressure is controlled.              Hyperlipidemia, LDL 77 06/18/21, off Rosuvastatin              Anemia,  Vit B12 228 04/03/20<<726 03/29/24, added Vit B 216/22, off Fe, Hgb 12.2 10/27/23             Osteopenia,  DEXA 07/15/22 t score -2.275, on Ca, Vit D. 03/07/23 offered Alendronate if desires-declined.     Past Medical History:  Diagnosis Date   Arthritis    right thumb (01/29/2015)   Chronic shoulder pain    between my shoulders (01/29/2015)   Dizziness    Esophageal stricture 2009   Gallstones    GERD (gastroesophageal reflux disease) 2004   Hemorrhoids 2004   Hiatal hernia 2004   History of blood transfusion 1947   appendix ruptured   HOH (hard of hearing)  Hypertension    Rhinitis, allergic    Syncopal episodes    Past Surgical History:  Procedure Laterality Date   APPENDECTOMY  1947   ruptured   CARDIAC CATHETERIZATION  10/23/1999; 09/2014   EF 60% No significant CAD; patent coronary arteries.   CARPAL TUNNEL RELEASE Right 1990's   CHOLECYSTECTOMY N/A 01/29/2015   Procedure: LAPAROSCOPIC CHOLECYSTECTOMY;  Surgeon: Donnice Bury, MD;  Location: [facility] OR;  Service: General;  Laterality: N/A;   DILATION AND CURETTAGE OF UTERUS  couple   ERCP N/A 09/13/2021   Procedure: ENDOSCOPIC RETROGRADE CHOLANGIOPANCREATOGRAPHY (ERCP);  Surgeon: Rollin Dover, MD;  Location: [facility];  Service: Gastroenterology;  Laterality: N/A;   ESOPHAGOGASTRODUODENOSCOPY (EGD) WITH ESOPHAGEAL DILATION  2-3 times   HEMORROIDECTOMY     KNEE ARTHROSCOPY Bilateral    right x 2   LAPAROSCOPIC CHOLECYSTECTOMY  01/29/2015   LEFT HEART CATHETERIZATION WITH CORONARY ANGIOGRAM N/A  09/16/2014   Procedure: LEFT HEART CATHETERIZATION WITH CORONARY ANGIOGRAM;  Surgeon: Dorn JINNY Lesches, MD;  Location: [facility] CATH LAB;  Service: Cardiovascular;  Laterality: N/A;   NM MYOVIEW  LTD  06/01/2012   EF 71%   REMOVAL OF STONES  09/13/2021   Procedure: REMOVAL OF STONES;  Surgeon: Rollin Dover, MD;  Location: [facility];  Service: Gastroenterology;;   ANNETT  09/13/2021   Procedure: ANNETT;  Surgeon: Rollin Dover, MD;  Location: [facility];  Service: Gastroenterology;;   VAGINAL HYSTERECTOMY  1970's    Allergies[1]  Allergies as of 06/01/2024       Reactions   Contrast Media [iodinated Contrast Media] Itching, Rash   Pt covered in rash, red.   Codeine Nausea And Vomiting   in large doses: can tolerate Tussionex        Medication List        Accurate as of June 01, 2024 11:59 PM. If you have any questions, ask your nurse or doctor.          acetaminophen  325 MG tablet Commonly known as: TYLENOL  Take 650 mg by mouth every 6 (six) hours as needed for mild pain.   alum & mag hydroxide-simeth 400-400-40 MG/5ML suspension Commonly known as: MAALOX PLUS Take 10 mLs by mouth every 6 (six) hours as needed for indigestion or heartburn.   amLODipine  5 MG tablet Commonly known as: NORVASC  Take 5 mg by mouth daily.   calcium  carbonate 1500 (600 Ca) MG Tabs tablet Commonly known as: OSCAL Take 600 mg of elemental calcium  by mouth daily.   cyanocobalamin  1000 MCG tablet Commonly known as: VITAMIN B12 Take 1,000 mcg by mouth daily.   dextromethorphan -guaiFENesin  10-100 MG/5ML liquid Commonly known as: ROBITUSSIN-DM Take by mouth every 6 (six) hours as needed for cough.   lactose free nutrition Liqd Take 237 mLs by mouth daily.   lidocaine  4 % Place 1 patch onto the skin every 12 (twelve) hours as needed (back pain). Remove & Discard patch within 12 hours or as directed by MD   LIDOCAN EX Apply topically. Apply to (R) lower back topically  every 24 hours as needed for Apply to (R) lower back as needed for pain related to LOW BACK PAIN, UNSPECIFIED (M54.50) 4% patch   memantine 10 MG tablet Commonly known as: NAMENDA Take 10 mg by mouth 2 (two) times daily.   omeprazole  40 MG capsule Commonly known as: PRILOSEC Take 40 mg by mouth daily.   ondansetron  4 MG disintegrating tablet Commonly known as: ZOFRAN -ODT Take 4 mg by mouth every 6 (six) hours as needed  for nausea or vomiting.   Vitamin D2 50 MCG (2000 UT) Tabs Take by mouth.        Review of Systems  Constitutional:  Negative for appetite change, fatigue and fever.  HENT:  Positive for hearing loss. Negative for congestion and trouble swallowing.   Eyes:  Negative for visual disturbance.  Respiratory:  Positive for cough. Negative for chest tightness and shortness of breath.        Baseline cough.   Cardiovascular:  Positive for leg swelling.  Gastrointestinal:  Negative for abdominal pain.  Genitourinary:  Positive for frequency. Negative for dysuria and urgency.       Baseline urinary frequency  Musculoskeletal:  Positive for arthralgias, back pain and gait problem.  Skin:  Negative for color change.  Neurological:  Negative for speech difficulty and weakness.       Memory lapses.   Psychiatric/Behavioral:  Negative for behavioral problems and sleep disturbance. The patient is not nervous/anxious.     Immunization History  Administered Date(s) Administered   Fluad Quad(high Dose 65+) 03/29/2022   INFLUENZA, HIGH DOSE SEASONAL PF 03/14/2013   Influenza Split 03/16/2011, 03/01/2012   Influenza Whole 03/28/2007, 04/01/2009, 02/18/2010   Influenza,inj,Quad PF,6+ Mos 03/20/2014, 03/11/2015   Influenza-Unspecified 08/06/2018, 03/20/2020   Moderna SARS-COV2 Booster Vaccination 11/04/2020   Moderna Sars-Covid-2 Vaccination 06/11/2019, 07/09/2019, 02/24/2021   PPD Test 04/23/2020, 11/03/2021   Pfizer Covid-19 Vaccine Bivalent Booster 23yrs & up 04/08/2022    Pneumococcal Conjugate-13 02/25/2015   Pneumococcal Polysaccharide-23 03/28/2007   Td 12/20/2005   Td (Adult) 07/12/2023   Zoster Recombinant(Shingrix) 07/13/2022, 10/12/2022   Pertinent  Health Maintenance Due  Topic Date Due   Bone Density Scan  Completed   Influenza Vaccine  Discontinued   Mammogram  Discontinued      09/15/2021    3:00 PM 09/15/2021    8:00 PM 09/16/2021   10:06 AM 07/02/2022    9:42 AM 07/06/2022    1:44 PM  Fall Risk  Falls in the past year?    0 0  Was there an injury with Fall?    0  0   Fall Risk Category Calculator    0 0  (RETIRED) Patient Fall Risk Level High fall risk  High fall risk  High fall risk     Patient at Risk for Falls Due to    No Fall Risks No Fall Risks  Fall risk Follow up    Falls evaluation completed Falls evaluation completed     Data saved with a previous flowsheet row definition   Functional Status Survey:    Vitals:   06/01/24 1531  BP: 126/65  Pulse: 77  Resp: 18  Temp: (!) 97.5 F (36.4 C)  SpO2: 95%  Weight: 198 lb 1.6 oz (89.9 kg)   Body mass index is 38.69 kg/m. Physical Exam Constitutional:      Appearance: Normal appearance.  HENT:     Head: Normocephalic and atraumatic.     Mouth/Throat:     Mouth: Mucous membranes are moist.  Eyes:     Extraocular Movements: Extraocular movements intact.     Pupils: Pupils are equal, round, and reactive to light.  Cardiovascular:     Rate and Rhythm: Normal rate and regular rhythm.     Heart sounds: No murmur heard. Pulmonary:     Effort: Pulmonary effort is normal.     Breath sounds: No rales.  Abdominal:     General: Bowel sounds are normal.  Palpations: Abdomen is soft.     Tenderness: There is no abdominal tenderness.  Musculoskeletal:        General: Tenderness present.     Cervical back: Normal range of motion and neck supple.     Right lower leg: Edema present.     Left lower leg: Edema present.     Comments: BLE chronic venous insufficiency skin  changes. Trace edema BLE.  Lower back pain about T2/L1 region C/o R knee pain, able to ROM and weight bearing  Skin:    General: Skin is warm and dry.  Neurological:     General: No focal deficit present.     Mental Status: She is alert. Mental status is at baseline.     Gait: Gait abnormal.     Comments: Oriented to person, her room on unit.  Ambulates with walker slowly.   Psychiatric:        Mood and Affect: Mood normal.        Behavior: Behavior normal.     Labs reviewed: Recent Labs    10/27/23 0000  NA 136*  K 4.3  CL 101  CO2 28*  BUN 19  CREATININE 1.2*  CALCIUM  9.0   No results for input(s): AST, ALT, ALKPHOS, BILITOT, PROT, ALBUMIN  in the last 8760 hours. Recent Labs    10/27/23 0000  WBC 7.6  HGB 12.2  HCT 38  PLT 228   Lab Results  Component Value Date   TSH 1.313 09/13/2021   Lab Results  Component Value Date   HGBA1C 5.8 07/11/2017   Lab Results  Component Value Date   CHOL 136 06/18/2021   HDL 34 (A) 06/18/2021   LDLCALC 77 06/18/2021   LDLDIRECT 161.2 07/18/2013   TRIG 151 06/18/2021   CHOLHDL 2.9 04/03/2020    Significant Diagnostic Results in last 30 days:  No results found.  Assessment/Plan  Hypertension takes Amlodipine , blood pressure is controlled.   Hyperlipidemia LDL 77 06/18/21, off Rosuvastatin   Vitamin B12 deficiency anemia Vit B12 228 04/03/20<<726 03/29/24, added Vit B 216/22, off Fe, Hgb 12.2 10/27/23  OSTEOPENIA  DEXA 07/15/22 t score -2.275, on Ca, Vit D. 03/07/23 offered Alendronate if desires-declined.   Edema of both lower extremities due to peripheral venous insufficiency Mild, off Furosemide, not new,  EF 50-55% 06/01/2017.  GERD better indigestion, burping, on Omeprazole . prn Maalox  CKD (chronic kidney disease) stage 3, GFR 30-59 ml/min (HCC) Bun/creat 19/1.2 10/27/23  Chronic cough on Benzonatate, at her baseline.   Osteoarthritis, multiple sites Ambulates with walker slowly  Major  neurocognitive disorder (HCC)  MMSE 19/30 02/22/23, tolerated Namenda well, TSH 3.4 04/05/23, functional well in memory care unit    Family/ staff Communication: plan of care reviewed with the patient and charge nurse.   Labs/tests ordered:  none       [1]  Allergies Allergen Reactions   Contrast Media [Iodinated Contrast Media] Itching and Rash    Pt covered in rash, red.   Codeine Nausea And Vomiting    in large doses: can tolerate Tussionex   "

## 2024-06-01 NOTE — Assessment & Plan Note (Signed)
takes Amlodipine, blood pressure is controlled

## 2024-06-01 NOTE — Assessment & Plan Note (Signed)
Ambulates with walker slowly.

## 2024-06-01 NOTE — Assessment & Plan Note (Signed)
LDL 77 06/18/21, off Rosuvastatin 

## 2024-06-15 ENCOUNTER — Non-Acute Institutional Stay (SKILLED_NURSING_FACILITY): Payer: Self-pay | Admitting: Family Medicine

## 2024-06-15 DIAGNOSIS — I10 Essential (primary) hypertension: Secondary | ICD-10-CM | POA: Diagnosis not present

## 2024-06-15 DIAGNOSIS — N1831 Chronic kidney disease, stage 3a: Secondary | ICD-10-CM

## 2024-06-15 DIAGNOSIS — F0390 Unspecified dementia without behavioral disturbance: Secondary | ICD-10-CM

## 2024-06-15 NOTE — Assessment & Plan Note (Addendum)
 Kidney function is stable.  CKD classified stage IIIa

## 2024-06-15 NOTE — Assessment & Plan Note (Addendum)
 BP today 139/66 controlled on amlodipine  5 mg

## 2024-06-15 NOTE — Progress Notes (Unsigned)
 " Provider:  Garnette Pinal, MD Location:      Place of Service:     PCP: Sherlynn Madden, MD Patient Care Team: Sherlynn Madden, MD as PCP - General (Internal Medicine) Court Dorn PARAS, MD as Consulting Physician (Cardiology)  Extended Emergency Contact Information Primary Emergency Contact: Mathis Mems Address: [address]          [place], [place] [address] [place] Home Phone: [phone] Mobile Phone: [phone] Relation: Niece  Code Status:  Goals of Care: Advanced Directive information    03/14/2024    2:19 PM  Advanced Directives  Does Patient Have a Medical Advance Directive? Yes  Type of Estate Agent of [place];Out of facility DNR (pink MOST or yellow form)  Does patient want to make changes to medical advance directive? No - Patient declined  Copy of Healthcare Power of Attorney in Chart? Yes - validated most recent copy scanned in chart (See row information)  Pre-existing out of facility DNR order (yellow form or pink MOST form) Pink MOST form placed in chart (order not valid for inpatient use)     HPI: Patient is a 89 y.o. female seen today for medical management of chronic problems including: Major neurocognitive disorder hypertension, hyperlipidemia, anemia due to B12 deficiency, and dependent edema. Patient currently resides in memory care unit.  Most recent MMSE was 19/30. Her appetite is good and weight is stable.  She ambulates with walker and there is no history of recent falls. I encountered her in the living room of the memory care unit.  She is pleasant and answer simple questions.  She denies any complaints.  Patient seems appropriate for current place in skilled nursing.  Past Medical History:  Diagnosis Date   Arthritis    right thumb (01/29/2015)   Chronic shoulder pain    between my shoulders (01/29/2015)   Dizziness    Esophageal stricture 2009   Gallstones    GERD  (gastroesophageal reflux disease) 2004   Hemorrhoids 2004   Hiatal hernia 2004   History of blood transfusion 1947   appendix ruptured   HOH (hard of hearing)    Hypertension    Rhinitis, allergic    Syncopal episodes    Past Surgical History:  Procedure Laterality Date   APPENDECTOMY  1947   ruptured   CARDIAC CATHETERIZATION  10/23/1999; 09/2014   EF 60% No significant CAD; patent coronary arteries.   CARPAL TUNNEL RELEASE Right 1990's   CHOLECYSTECTOMY N/A 01/29/2015   Procedure: LAPAROSCOPIC CHOLECYSTECTOMY;  Surgeon: Donnice Bury, MD;  Location: [facility] OR;  Service: General;  Laterality: N/A;   DILATION AND CURETTAGE OF UTERUS  couple   ERCP N/A 09/13/2021   Procedure: ENDOSCOPIC RETROGRADE CHOLANGIOPANCREATOGRAPHY (ERCP);  Surgeon: Rollin Dover, MD;  Location: [facility];  Service: Gastroenterology;  Laterality: N/A;   ESOPHAGOGASTRODUODENOSCOPY (EGD) WITH ESOPHAGEAL DILATION  2-3 times   HEMORROIDECTOMY     KNEE ARTHROSCOPY Bilateral    right x 2   LAPAROSCOPIC CHOLECYSTECTOMY  01/29/2015   LEFT HEART CATHETERIZATION WITH CORONARY ANGIOGRAM N/A 09/16/2014   Procedure: LEFT HEART CATHETERIZATION WITH CORONARY ANGIOGRAM;  Surgeon: Dorn PARAS Court, MD;  Location: [facility];  Service: Cardiovascular;  Laterality: N/A;   NM MYOVIEW  LTD  06/01/2012   EF 71%   REMOVAL OF STONES  09/13/2021   Procedure: REMOVAL OF STONES;  Surgeon: Rollin Dover, MD;  Location: [facility];  Service: Gastroenterology;;   ANNETT  09/13/2021   Procedure: SPHINCTEROTOMY;  Surgeon:  Rollin Dover, MD;  Location: [facility];  Service: Gastroenterology;;   VAGINAL HYSTERECTOMY  [phone]    reports that she has never smoked. She has never used smokeless tobacco. She reports that she does not drink alcohol  and does not use drugs. 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   She does drink/eat things with caffeine in it.   Marital status: widow (married in 1957)   Patient lives in an assisted living 1 story apartment home   Highest level of education completed was high school.   Past profession: home maker   Exercise: walk 3 x week   Patient has a POA and living   Social Drivers of Health   Tobacco Use: Low Risk (06/01/2024)   Patient History    Smoking Tobacco Use: Never    Smokeless Tobacco Use: Never    Passive Exposure: Not on file  Financial Resource Strain: Not on file  Food Insecurity: Not on file  Transportation Needs: Not on file  Physical Activity: Not on file  Stress: Not on file  Social Connections: Not on file  Intimate Partner Violence: Not on file  Depression (PHQ2-9): Low Risk (07/07/2023)   Depression (PHQ2-9)    PHQ-2 Score: 0  Alcohol  Screen: Not on file  Housing: Not on file  Utilities: Not on file  Health Literacy: Not on file    Functional Status Survey:    Family History  Problem Relation Age of Onset   Stroke Father 61       light stroke   Heart disease Sister    COPD Sister    Heart disease Brother    Coronary artery disease Neg Hx    Diabetes Neg Hx    Cancer Neg Hx        breast, colon, prostate    Health Maintenance  Topic Date Due   COVID-19 Vaccine (6 - 2025-26 season) 02/06/2024   Medicare Annual Wellness (AWV)  07/06/2024   DTaP/Tdap/Td (3 - Tdap) 07/11/2033   Pneumococcal Vaccine: 50+ Years  Completed    Bone Density Scan  Completed   Zoster Vaccines- Shingrix  Completed   Meningococcal B Vaccine  Aged Out   Influenza Vaccine  Discontinued   Mammogram  Discontinued    Allergies[1]  Outpatient Encounter Medications as of 06/15/2024  Medication Sig   acetaminophen  (TYLENOL ) 325 MG tablet Take 650 mg by mouth every 6 (six) hours as needed for mild pain.   alum & mag hydroxide-simeth (MAALOX PLUS) 400-400-40 MG/5ML suspension Take 10 mLs by mouth every 6 (six) hours as needed for indigestion or heartburn.   amLODipine  (NORVASC ) 5 MG tablet Take 5 mg by mouth daily.   calcium  carbonate (OSCAL) 1500 (600 Ca) MG TABS tablet Take 600 mg  of elemental calcium  by mouth daily.   dextromethorphan -guaiFENesin  (ROBITUSSIN-DM) 10-100 MG/5ML liquid Take by mouth every 6 (six) hours as needed for cough.   Ergocalciferol  (VITAMIN D2) 50 MCG (2000 UT) TABS Take by mouth.   lactose free nutrition (BOOST) LIQD Take 237 mLs by mouth daily.   Lidocaine  (LIDOCAN EX) Apply topically. Apply to (R) lower back topically every 24 hours as needed for Apply to (R) lower back as needed for pain related to LOW BACK PAIN, UNSPECIFIED (M54.50) 4% patch   lidocaine  4 % Place 1 patch onto the skin every 12 (twelve) hours as needed (back pain). Remove & Discard patch within 12 hours or as directed by MD   memantine (NAMENDA) 10 MG tablet Take 10 mg by mouth 2 (two) times daily.   omeprazole  (PRILOSEC) 40 MG capsule Take 40 mg by mouth daily.   ondansetron  (ZOFRAN -ODT) 4 MG disintegrating tablet Take 4 mg by mouth every 6 (six) hours as needed for nausea or vomiting.   vitamin B-12 (CYANOCOBALAMIN ) 1000 MCG tablet Take 1,000 mcg by mouth daily.   No facility-administered encounter medications on file as of 06/15/2024.    Review of Systems  HENT:  Positive for hearing loss.   Cardiovascular:  Positive for leg swelling.  Musculoskeletal:  Positive for arthralgias and gait problem.  Hematological: Negative.    Psychiatric/Behavioral:  Positive for confusion.   All other systems reviewed and are negative.   There were no vitals filed for this visit. There is no height or weight on file to calculate BMI. Physical Exam Vitals and nursing note reviewed.  Constitutional:      Appearance: Normal appearance.  HENT:     Head: Normocephalic.     Mouth/Throat:     Mouth: Mucous membranes are moist.  Eyes:     Pupils: Pupils are equal, round, and reactive to light.  Cardiovascular:     Rate and Rhythm: Normal rate and regular rhythm.  Pulmonary:     Effort: Pulmonary effort is normal.     Breath sounds: Normal breath sounds.  Abdominal:     General: Bowel sounds are normal.     Palpations: Abdomen is soft.  Musculoskeletal:     Right lower leg: Edema present.     Left lower leg: Edema present.  Neurological:     General: No focal deficit present.     Mental Status: She is alert.     Comments: Oriented to person  Psychiatric:        Mood and Affect: Mood normal.        Behavior: Behavior normal.     Labs reviewed: Basic Metabolic Panel: Recent Labs    10/27/23 0000  NA 136*  K 4.3  CL 101  CO2 28*  BUN 19  CREATININE 1.2*  CALCIUM  9.0   Liver Function Tests: No results for input(s): AST, ALT, ALKPHOS, BILITOT, PROT, ALBUMIN  in the last 8760 hours. No results for input(s): LIPASE, AMYLASE in the last 8760 hours. No results for input(s): AMMONIA in the last 8760 hours. CBC: Recent Labs    10/27/23 0000  WBC 7.6  HGB 12.2  HCT 38  PLT 228   Cardiac Enzymes: No results for input(s): CKTOTAL, CKMB, CKMBINDEX, TROPONINI in the last 8760 hours. BNP: Invalid input(s): POCBNP Lab Results  Component Value Date   HGBA1C 5.8 07/11/2017   Lab Results  Component Value Date   TSH 1.313 09/13/2021   Lab Results  Component Value Date   VITAMINB12 726  03/29/2024   Lab Results  Component Value Date   FOLATE 8.4 04/13/2010   Lab Results   Component Value Date   IRON 23 08/27/2020   TIBC 368 08/27/2020   FERRITIN 6 08/27/2020    Imaging and Procedures obtained prior to SNF admission: DG ERCP Result Date: 09/14/2021 CLINICAL DATA:  Choledocholithiasis EXAM: ERCP TECHNIQUE: Multiple spot images obtained with the fluoroscopic device and submitted for interpretation post-procedure. FLUOROSCOPY: Radiation Exposure Index (as provided by the fluoroscopic device): 67.53 mGy Kerma COMPARISON:  CT abdomen/pelvis 09/12/2021 FINDINGS: Total of 6 intraoperative saved images are submitted for review. The images demonstrate a flexible duodenal scope in the descending duodenum with wire cannulation of the common bile duct. Subsequently, balloon occluded cholangiogram is performed demonstrating biliary ductal dilation. Filling defects in the distal common bile duct suggest choledocholithiasis. Subsequent images document sphincterotomy and balloon sweep of the common duct. IMPRESSION: 1. Choledocholithiasis. 2. ERCP with sphincterotomy and balloon sweeping of the common duct. These images were submitted for radiologic interpretation only. Please see the procedural report for the amount of contrast and the fluoroscopy time utilized. Electronically Signed   By: Wilkie Lent M.D.   On: 09/14/2021 08:03   CT Abdomen Pelvis Wo Contrast Result Date: 09/12/2021 CLINICAL DATA:  Nausea/vomiting. unwitnessed fall at facility. Was found face down with vomit around her. EXAM: CT ABDOMEN AND PELVIS WITHOUT CONTRAST TECHNIQUE: Multidetector CT imaging of the abdomen and pelvis was performed following the standard protocol without IV contrast. RADIATION DOSE REDUCTION: This exam was performed according to the departmental dose-optimization program which includes automated exposure control, adjustment of the mA and/or kV according to patient size and/or use of iterative reconstruction technique. COMPARISON:  None. FINDINGS: Slightly limited evaluation due to motion  artifact. Lower chest: No acute abnormality.  Tiny hiatal hernia. Liver: Mildly enlarged measuring up to 18 cm.  No focal lesion. Biliary System: Status post cholecystectomy. Enlarged common bile duct measuring up to 1.4 cm with associated intraluminal hyperdensities measuring approximately and 0.70.8 cm. Pancreas: Diffusely atrophic. No focal lesion. Query slight fat stranding along the proximal pancreas (2:31). No main pancreatic ductal dilatation. Spleen: Not enlarged. No focal lesion. Adrenal Glands: No nodularity bilaterally. Kidneys: No hydroureteronephrosis. No nephroureterolithiasis. No contour deforming renal mass. Anterior urinary bladder diverticula. Otherwise the urinary bladder is unremarkable. Bowel: No small or large bowel wall thickening or dilatation. Status post appendectomy. Mesentery, Omentum, and Peritoneum: No simple free fluid ascites. No pneumoperitoneum. No mesenteric hematoma identified. No organized fluid collection. Pelvic Organs: Status post hysterectomy. Bilateral adnexal regions are unremarkable. Lymph Nodes: No abdominal, pelvic, inguinal lymphadenopathy. Vasculature: Severe atherosclerotic plaque. No abdominal aorta or iliac aneurysm. Musculoskeletal: No significant soft tissue hematoma. Small to moderate umbilical hernia containing fat with abdominal wall defect of 3.3 cm. No acute pelvic fracture. No spinal fracture. Multilevel degenerative changes spine. IMPRESSION: 1. Findings suggestive of choledocholithiasis in a patient status post cholecystectomy. Correlate with liver function tests. 2. Query slight fat stranding along the proximal pancreas with limited evaluation due to motion artifact. Correlate with lipase levels. 3. No acute traumatic injury to the abdomen or pelvis on this noncontrast study. 4. No acute fracture or traumatic malalignment of the lumbar spine. Other imaging findings of potential clinical significance: 1. Small hiatal hernia. 2. Fat containing umbilical  hernia. A findings suggestive ischemia or bowel obstruction. 3. Aortic Atherosclerosis (ICD10-I70.0). 4. Status post cholecystectomy, appendectomy, hysterectomy. Electronically Signed   By: Morgane  Naveau M.D.   On: 09/12/2021 23:37   CT Head  Wo Contrast Result Date: 09/12/2021 CLINICAL DATA:  Head trauma, minor (Age >= 65y); Polytrauma, blunt. Unwitnessed fall EXAM: CT HEAD WITHOUT CONTRAST CT CERVICAL SPINE WITHOUT CONTRAST TECHNIQUE: Multidetector CT imaging of the head and cervical spine was performed following the standard protocol without intravenous contrast. Multiplanar CT image reconstructions of the cervical spine were also generated. RADIATION DOSE REDUCTION: This exam was performed according to the departmental dose-optimization program which includes automated exposure control, adjustment of the mA and/or kV according to patient size and/or use of iterative reconstruction technique. COMPARISON:  None. FINDINGS: CT HEAD FINDINGS BRAIN: BRAIN Cerebral ventricle sizes are concordant with the degree of cerebral volume loss. Patchy and confluent areas of decreased attenuation are noted throughout the deep and periventricular white matter of the cerebral hemispheres bilaterally, compatible with chronic microvascular ischemic disease. No evidence of large-territorial acute infarction. No parenchymal hemorrhage. No mass lesion. No extra-axial collection. No mass effect or midline shift. No hydrocephalus. Basilar cisterns are patent. Vascular: No hyperdense vessel. Atherosclerotic calcifications are present within the cavernous internal carotid arteries. Skull: No acute fracture or focal lesion. Sinuses/Orbits: Trace mucosal thickening of the right maxillary sinus. Otherwise paranasal sinuses and mastoid air cells are clear. Bilateral lens replacement. Otherwise the orbits are unremarkable. Other: None. CT CERVICAL SPINE FINDINGS Alignment: Normal. Skull base and vertebrae: Multilevel moderate degenerative  changes spine. Severe osseous neural foraminal stenosis at the C4-C5 level. No acute fracture. No aggressive appearing focal osseous lesion or focal pathologic process. Soft tissues and spinal canal: No prevertebral fluid or swelling. No visible canal hematoma. Upper chest: Unremarkable. Other: None. IMPRESSION: 1. No acute intracranial abnormality. 2. No acute displaced fracture or traumatic listhesis of the cervical spine. Electronically Signed   By: Morgane  Naveau M.D.   On: 09/12/2021 23:25   CT Cervical Spine Wo Contrast Result Date: 09/12/2021 CLINICAL DATA:  Head trauma, minor (Age >= 65y); Polytrauma, blunt. Unwitnessed fall EXAM: CT HEAD WITHOUT CONTRAST CT CERVICAL SPINE WITHOUT CONTRAST TECHNIQUE: Multidetector CT imaging of the head and cervical spine was performed following the standard protocol without intravenous contrast. Multiplanar CT image reconstructions of the cervical spine were also generated. RADIATION DOSE REDUCTION: This exam was performed according to the departmental dose-optimization program which includes automated exposure control, adjustment of the mA and/or kV according to patient size and/or use of iterative reconstruction technique. COMPARISON:  None. FINDINGS: CT HEAD FINDINGS BRAIN: BRAIN Cerebral ventricle sizes are concordant with the degree of cerebral volume loss. Patchy and confluent areas of decreased attenuation are noted throughout the deep and periventricular white matter of the cerebral hemispheres bilaterally, compatible with chronic microvascular ischemic disease. No evidence of large-territorial acute infarction. No parenchymal hemorrhage. No mass lesion. No extra-axial collection. No mass effect or midline shift. No hydrocephalus. Basilar cisterns are patent. Vascular: No hyperdense vessel. Atherosclerotic calcifications are present within the cavernous internal carotid arteries. Skull: No acute fracture or focal lesion. Sinuses/Orbits: Trace mucosal thickening  of the right maxillary sinus. Otherwise paranasal sinuses and mastoid air cells are clear. Bilateral lens replacement. Otherwise the orbits are unremarkable. Other: None. CT CERVICAL SPINE FINDINGS Alignment: Normal. Skull base and vertebrae: Multilevel moderate degenerative changes spine. Severe osseous neural foraminal stenosis at the C4-C5 level. No acute fracture. No aggressive appearing focal osseous lesion or focal pathologic process. Soft tissues and spinal canal: No prevertebral fluid or swelling. No visible canal hematoma. Upper chest: Unremarkable. Other: None. IMPRESSION: 1. No acute intracranial abnormality. 2. No acute displaced fracture or traumatic listhesis of the cervical  spine. Electronically Signed   By: Morgane  Naveau M.D.   On: 09/12/2021 23:25   DG Chest Port 1 View Result Date: 09/12/2021 CLINICAL DATA:  Fever unwitnessed fall EXAM: PORTABLE CHEST 1 VIEW COMPARISON:  05/19/2017 FINDINGS: The heart size and mediastinal contours are within normal limits. Aortic atherosclerosis. Both lungs are clear. The visualized skeletal structures are unremarkable. IMPRESSION: No active disease. Electronically Signed   By: Luke Bun M.D.   On: 09/12/2021 22:15    Assessment/Plan Assessment & Plan Primary hypertension BP today 139/66 controlled on amlodipine  5 mg Major neurocognitive disorder [facility]) Appropriate for memory care unit.  Continue with Namenda.  There are no behaviors. Stage 3a chronic kidney disease (HCC) Kidney function is stable.  CKD classified stage IIIa  Family/ staff Communication:   Labs/tests ordered:  .smmsig     [1]  Allergies Allergen Reactions   Contrast Media [Iodinated Contrast Media] Itching and Rash    Pt covered in rash, red.   Codeine Nausea And Vomiting    in large doses: can tolerate Tussionex   "

## 2024-06-15 NOTE — Assessment & Plan Note (Addendum)
 Appropriate for memory care unit.  Continue with Namenda.  There are no behaviors.
